# Patient Record
Sex: Male | Born: 1940 | Race: White | Hispanic: No | Marital: Married | Smoking: Former smoker
Health system: Southern US, Community
[De-identification: ages and names within clinical notes are randomized; demographics above are authoritative.]

## PROBLEM LIST (undated history)

## (undated) DIAGNOSIS — I10 Essential (primary) hypertension: Secondary | ICD-10-CM

## (undated) DIAGNOSIS — C159 Malignant neoplasm of esophagus, unspecified: Secondary | ICD-10-CM

## (undated) DIAGNOSIS — E785 Hyperlipidemia, unspecified: Secondary | ICD-10-CM

## (undated) DIAGNOSIS — K219 Gastro-esophageal reflux disease without esophagitis: Secondary | ICD-10-CM

## (undated) HISTORY — PX: INGUINAL HERNIA REPAIR: SUR1180

## (undated) HISTORY — DX: Essential (primary) hypertension: I10

## (undated) HISTORY — PX: ERCP: SHX60

## (undated) HISTORY — PX: LEG SURGERY: SHX1003

## (undated) HISTORY — PX: OTHER SURGICAL HISTORY: SHX169

---

## 1999-11-02 HISTORY — PX: OTHER SURGICAL HISTORY: SHX169

## 2009-01-11 ENCOUNTER — Ambulatory Visit (HOSPITAL_COMMUNITY): Admission: RE | Admit: 2009-01-11 | Discharge: 2009-01-11 | Payer: Self-pay | Admitting: Orthopaedic Surgery

## 2010-10-30 LAB — POCT HEMOGLOBIN-HEMACUE: Hemoglobin: 15.5 g/dL (ref 13.0–17.0)

## 2010-12-05 NOTE — Op Note (Signed)
NAME:  KAY, RICCIUTI NO.:  [id number]   MEDICAL RECORD NO.:  [id number]          PATIENT TYPE:  AMB   LOCATION:  DSC                          FACILITY:  [facility]   PHYSICIAN:  Lubertha Basque. Dalldorf, M.D.DATE OF BIRTH:  05/07/1941   DATE OF PROCEDURE:  01/11/2009  DATE OF DISCHARGE:                               OPERATIVE REPORT   PREOPERATIVE DIAGNOSES:  1. Right shoulder impingement.  2. Right shoulder massive rotator cuff tear.   POSTOPERATIVE DIAGNOSES:  1. Right shoulder impingement.  2. Right shoulder massive rotator cuff tear.   PROCEDURE:  1. Right shoulder arthroscopic acromioplasty.  2. Right shoulder arthroscopic partial claviculectomy.  3. Right shoulder arthroscopic debridement.   ANESTHESIA:  General and block.   ATTENDING SURGEON:  Lubertha Basque. Dalldorf, MD   INDICATIONS FOR PROCEDURE:  The patient is a 70 year old male with about  4 months of significant shoulder pain.  This started with a fall.  He  has had difficulty using his arm and resting.  His x-ray and MRI studies  are consistent with a massive rotator cuff tear.  He is offered  arthroscopy.  Informed operative consent was obtained after discussion  of possible complications including reaction to anesthesia and  infection.   SUMMARY, FINDINGS, AND PROCEDURE:  Under general anesthesia and a block,  a right shoulder arthroscopy was performed.  The glenohumeral joint  showed no degenerative changes and the biceps tendon was intact.  A  massive cuff tear which was retracted back to the glenoid was discovered  and could not be advanced anywhere near the greater tuberosity.  A  thorough debridement was done taking this out of harm's way leaving  anterior and posterior sleeves of tissue.  He had an extremely prominent  subacromial morphology addressed with an acromioplasty back to a flat  surface.  He also had a lesser deformity at the [facility] joint addressed with  a partial claviculectomy.   DESCRIPTION OF PROCEDURE:  The patient was taken to the operating suite  where general anesthetic was applied without difficulty.  He was also  given a block in the preanesthesia area.  He was positioned in a beach-  chair position, and prepped and draped in normal sterile fashion.  After  administration of IV Kefzol, an arthroscopy of the right shoulder was  performed through a total of 3 portals.  Findings were as noted above  and procedure consisted of most significantly the acromioplasty.  This  was done with a bur in the lateral position involving the transfer of  the bur to the posterior position.  I also performed resection of  prominence of the distal clavicle.  He had a massive rotator cuff tear  which I could not advance significantly and I elected to debride the  remaining portion of this structure.  The biceps tendon was intact and  there was minimal, if any, degenerative change in the shoulder.  The  shoulder was thoroughly irrigated followed by reapproximation of portals  loosely with nylon.  Adaptic was applied followed by dry gauze and tape.  Estimated blood loss and intraoperative  fluids can be obtained from  Anesthesia records.   DISPOSITION:  The patient was extubated in the operating room and taken  to recovery room in stable condition.  He was to go home same-day and  follow up with me in the office in less than a week.  I will contact him  by phone tonight.      Lubertha Basque Jerl Santos, M.D.  Electronically Signed     PGD/MEDQ  D:  01/11/2009  T:  01/12/2009  Job:  [id number]

## 2015-10-05 HISTORY — PX: PROSTATE BIOPSY: SHX241

## 2015-11-23 DIAGNOSIS — C159 Malignant neoplasm of esophagus, unspecified: Secondary | ICD-10-CM

## 2015-11-23 HISTORY — DX: Malignant neoplasm of esophagus, unspecified: C15.9

## 2015-11-24 DIAGNOSIS — C155 Malignant neoplasm of lower third of esophagus: Secondary | ICD-10-CM | POA: Insufficient documentation

## 2015-12-13 ENCOUNTER — Encounter: Payer: Self-pay | Admitting: Radiation Oncology

## 2015-12-13 ENCOUNTER — Telehealth: Payer: Self-pay | Admitting: *Deleted

## 2015-12-13 ENCOUNTER — Other Ambulatory Visit: Payer: Self-pay | Admitting: Radiation Oncology

## 2015-12-13 ENCOUNTER — Inpatient Hospital Stay
Admission: RE | Admit: 2015-12-13 | Discharge: 2015-12-13 | Disposition: A | Discharge status: Home / Self Care | Payer: Self-pay | Source: Ambulatory Visit | Attending: Radiation Oncology | Admitting: Radiation Oncology

## 2015-12-13 DIAGNOSIS — C155 Malignant neoplasm of lower third of esophagus: Secondary | ICD-10-CM

## 2015-12-13 NOTE — Telephone Encounter (Signed)
Oncology Nurse Navigator Documentation  Oncology Nurse Navigator Flowsheets 12/13/2015  Navigator Location [facility]  Navigator Encounter Type Introductory phone call  Abnormal Finding Date 11/23/2015  Confirmed Diagnosis Date 11/24/2015  Spoke with patient and his wife and  provided new patient appointment for 12/14/15 at 1:00 pm with Dr. Truitt Merle. Informed of location of [place], valet service, and registration process. Reminded to bring insurance cards and a current medication list, including supplements. Patient verbalizes understanding. Informed him that navigator will assist him in getting downstairs to see Dr. Lisbeth Renshaw after his visit with Dr. Burr Medico. Left VM for his daughter, Janace Hoard as well with the appointment. HIM notified to enter appt. In EPIC.

## 2015-12-13 NOTE — Telephone Encounter (Signed)
Patient's daughter Fred [name] came and brought medical records and two cd's which I took upstairs to [place] in radiology to upload in the system.

## 2015-12-13 NOTE — Progress Notes (Addendum)
GI Location of Tumor / Histology: Esophagus (6cm) mass  Fred [name] presented  months ago with symptoms of: trouble swallowing   Biopsies of  (if applicable) revealed: [id number] third of Esophageus,mucosal biopsy: Mucinous adenocarcinoma,moderately differentiated  Past/Anticipated interventions by surgeon, if any: Dr. Benay Pike. Toledo,MD.11/23/15 : EGD   Past/Anticipated interventions by medical oncology, if any: saw Dr. Burr Medico today 12/14/15  Weight changes, if any: weight loss 15 lb   Bowel/Bladder complaints, if any: regular bowel and bladder   Nausea / Vomiting, if any: No  Pain issues, if any: NO  Any blood per rectum:  No  SAFETY ISSUES: difficulty swallowing some foods mostly breads, has to cut up food s and chews a long time  Prior radiation? NO  Pacemaker/ICD?  NO Is the patient on methotrexate? NO Current Complaints/Details: Married,Enlarged prostate,former cigarette smoker 1ppd for 8-10 years   Quit 1969, no smokeless tobacco, no alcohol,no drug use Maternal grandfather stomach cancer  Allergies:NKA  Prostate surgery 5 weeks ago and bx'd no cancer  Dr, Alejandro Mulling, [facility]     BP 140/55 mmHg  Pulse 54  Temp(Src) 98.3 F (36.8 C) (Oral)  Resp 20  Ht 5' 5.5" (1.664 m)  Wt 141 lb (63.957 kg)  BMI 23.10 kg/m2  SpO2 100%  Wt Readings from Last 3 Encounters:  12/14/15 141 lb (63.957 kg)  12/14/15 141 lb 1.6 oz (64.003 kg)

## 2015-12-14 ENCOUNTER — Ambulatory Visit
Admission: RE | Admit: 2015-12-14 | Discharge: 2015-12-14 | Disposition: A | Discharge status: Home / Self Care | Payer: No Typology Code available for payment source | Source: Ambulatory Visit | Attending: Radiation Oncology | Admitting: Radiation Oncology

## 2015-12-14 ENCOUNTER — Other Ambulatory Visit: Payer: Self-pay | Admitting: Hematology

## 2015-12-14 ENCOUNTER — Ambulatory Visit (HOSPITAL_BASED_OUTPATIENT_CLINIC_OR_DEPARTMENT_OTHER): Payer: No Typology Code available for payment source

## 2015-12-14 ENCOUNTER — Encounter: Payer: Self-pay | Admitting: Radiation Oncology

## 2015-12-14 ENCOUNTER — Encounter: Payer: Self-pay | Admitting: Hematology

## 2015-12-14 ENCOUNTER — Ambulatory Visit
Admission: RE | Admit: 2015-12-14 | Discharge: 2015-12-14 | Disposition: A | Discharge status: Home / Self Care | Payer: Self-pay | Source: Ambulatory Visit | Attending: Hematology | Admitting: Hematology

## 2015-12-14 ENCOUNTER — Telehealth: Payer: Self-pay | Admitting: Hematology

## 2015-12-14 ENCOUNTER — Ambulatory Visit (HOSPITAL_BASED_OUTPATIENT_CLINIC_OR_DEPARTMENT_OTHER): Payer: No Typology Code available for payment source | Admitting: Hematology

## 2015-12-14 ENCOUNTER — Encounter: Payer: Self-pay | Admitting: *Deleted

## 2015-12-14 VITALS — BP 148/56 | HR 63 | Temp 98.2°F | Resp 18 | Ht 65.5 in | Wt 141.1 lb

## 2015-12-14 VITALS — BP 140/55 | HR 54 | Temp 98.3°F | Resp 20 | Ht 65.5 in | Wt 141.0 lb

## 2015-12-14 DIAGNOSIS — I10 Essential (primary) hypertension: Secondary | ICD-10-CM

## 2015-12-14 DIAGNOSIS — C155 Malignant neoplasm of lower third of esophagus: Secondary | ICD-10-CM

## 2015-12-14 DIAGNOSIS — C801 Malignant (primary) neoplasm, unspecified: Secondary | ICD-10-CM

## 2015-12-14 DIAGNOSIS — K219 Gastro-esophageal reflux disease without esophagitis: Secondary | ICD-10-CM | POA: Insufficient documentation

## 2015-12-14 DIAGNOSIS — Z510 Encounter for antineoplastic radiation therapy: Secondary | ICD-10-CM | POA: Insufficient documentation

## 2015-12-14 HISTORY — DX: Gastro-esophageal reflux disease without esophagitis: K21.9

## 2015-12-14 HISTORY — DX: Malignant neoplasm of esophagus, unspecified: C15.9

## 2015-12-14 LAB — COMPREHENSIVE METABOLIC PANEL
ALT: 13 U/L (ref 0–55)
ANION GAP: 9 meq/L (ref 3–11)
AST: 18 U/L (ref 5–34)
Albumin: 4 g/dL (ref 3.5–5.0)
Alkaline Phosphatase: 85 U/L (ref 40–150)
BILIRUBIN TOTAL: 0.44 mg/dL (ref 0.20–1.20)
BUN: 6.6 mg/dL — ABNORMAL LOW (ref 7.0–26.0)
CALCIUM: 9.3 mg/dL (ref 8.4–10.4)
CHLORIDE: 109 meq/L (ref 98–109)
CO2: 26 meq/L (ref 22–29)
CREATININE: 0.8 mg/dL (ref 0.7–1.3)
EGFR: 87 mL/min/{1.73_m2} — AB (ref >90)
Glucose: 100 mg/dl (ref 70–140)
Potassium: 4.1 mEq/L (ref 3.5–5.1)
Sodium: 143 mEq/L (ref 136–145)
Total Protein: 7 g/dL (ref 6.4–8.3)

## 2015-12-14 LAB — CBC WITH DIFFERENTIAL/PLATELET
BASO%: 0.8 % (ref 0.0–2.0)
BASOS ABS: 0.1 10*3/uL (ref 0.0–0.1)
EOS ABS: 0.1 10*3/uL (ref 0.0–0.5)
EOS%: 2 % (ref 0.0–7.0)
HEMATOCRIT: 41.4 % (ref 38.4–49.9)
HGB: 14.3 g/dL (ref 13.0–17.1)
LYMPH#: 1.5 10*3/uL (ref 0.9–3.3)
LYMPH%: 20.7 % (ref 14.0–49.0)
MCH: 30.9 pg (ref 27.2–33.4)
MCHC: 34.4 g/dL (ref 32.0–36.0)
MCV: 89.8 fL (ref 79.3–98.0)
MONO#: 0.6 10*3/uL (ref 0.1–0.9)
MONO%: 7.9 % (ref 0.0–14.0)
NEUT#: 4.8 10*3/uL (ref 1.5–6.5)
NEUT%: 68.6 % (ref 39.0–75.0)
PLATELETS: 250 10*3/uL (ref 140–400)
RBC: 4.61 10*6/uL (ref 4.20–5.82)
RDW: 13.3 % (ref 11.0–14.6)
WBC: 7 10*3/uL (ref 4.0–10.3)

## 2015-12-14 NOTE — Progress Notes (Signed)
Radiation Oncology         [phone] ________________________________  Name: Fred [name] MRN: [id number]  Date: 12/14/2015  DOB: 1940-10-10  CC:No primary care provider on file.  No ref. provider found     REFERRING PHYSICIAN: No ref. 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  HISTORY OF PRESENT ILLNESS:Fred [name] is a 75 y.o. male who is seen for an initial consultation visit. He initially presented with about 15 pounds of weight loss over about 3 months. He began experiencing trouble swallowing a couple weeks ago. He then presented to Dr. Alice Reichert who recommended that patient undergo endoscopy, and on 11/24/2015 this was performed revealing a mass in this lower third of his esophagus approximately 6 cm in dimension and on biopsy found to be mucinous adenocarcinoma moderately differentiated. A PET scan on 12/05/2015 revealed an SUV of 8.5 in the esphogeal mass and hypermetabolic activity with a SUV of 19.8 in the prostate. He did not have any other sites for metastatic disease, and apparently had procedure recently performed on his prostate. He has met with Dr. Burr Medico. He anticipates undergoing chemosensitization in radiotherapy.  He is scheduled for an EUS on 12/15/2015 and will meet with a surgeon at [facility] on 12/16/2015 at 10:00 AM.     PREVIOUS RADIATION THERAPY: No  PAST MEDICAL HISTORY:  has a past medical history of Esophageal cancer ([place]) (11/23/15); Hypertension; and GERD (gastroesophageal reflux disease).    PAST SURGICAL HISTORY: Past Surgical History  Procedure Laterality Date  . Right shoulder surgery    . Prostate biopsy  10/05/15  . Cystoscope  4/12/1    prostate   FAMILY HISTORY: family history includes Cancer (age of onset: 44) in his maternal grandfather.  SOCIAL HISTORY:  reports that he quit smoking about 48 years ago. He does not have any smokeless tobacco history on file. He reports that he does not  drink alcohol or use illicit drugs. The patient is married and resides in [place]. He is accompanied by his wife and daughter.  ALLERGIES: Review of patient's allergies indicates no known allergies.  MEDICATIONS:  Current Outpatient Prescriptions  Medication Sig Dispense Refill  . atenolol (TENORMIN) 25 MG tablet Take 25 mg by mouth daily.    Marland Kitchen omeprazole (PRILOSEC) 20 MG capsule Take 20 mg by mouth daily.    . simvastatin (ZOCOR) 20 MG tablet Take 20 mg by mouth daily at 6 PM. 1/2 tablet    . Vitamin D, Cholecalciferol, 1000 units TABS Take 1,000 Units by mouth 2 (two) times daily. D3     No current facility-administered medications for this encounter.   REVIEW OF SYSTEMS: On review of systems, the patient reports some difficulty swallowing solid foods and bread. He reports having to cut his foods into smaller pieces. He states that he sometimes feels as though his food gets caught in his chest. He is mainly consuming soft foods at this time. He reports some pain in his jaw area bilaterally. He denies any pain elsewhere. He denies any bladder/bowel issues, nausea, vomiting, pain, or any blood per rectum. He has had a 15 pound weight loss. A complete review of systems was obtained and is otherwise negative.    PHYSICAL EXAM:  height is 5' 5.5" (1.664 m) and weight is 141 lb (63.957 kg). His oral temperature is 98.3 F (36.8 C). His blood pressure is 140/55 and his pulse is 54. His respiration is 20 and oxygen saturation is  100%.   In general this is a thin and chronically ill-appearing in no acute distress. He is alert and oriented x4 and appropriate throughout the examination. HEENT reveals that the patient is normocephalic, atraumatic. EOMs are intact. PERRLA. Skin is intact without any evidence of gross lesions. Cardiovascular exam reveals a regular rate and rhythm, no clicks rubs or murmurs are auscultated. Chest is clear to auscultation bilaterally. Lymphatic assessment is performed and  does not reveal any adenopathy in the cervical, supraclavicular, axillary, or inguinal chains. Abdomen has active bowel sounds in all quadrants and is intact. The abdomen is soft, non tender, non distended. Lower extremities are negative for pretibial pitting edema, deep calf tenderness, cyanosis or clubbing.   ECOG = 0  0 - Asymptomatic (Fully active, able to carry on all predisease activities without restriction)  1 - Symptomatic but completely ambulatory (Restricted in physically strenuous activity but ambulatory and able to carry out work of a light or sedentary nature. For example, light housework, office work)  2 - Symptomatic, <50% in bed during the day (Ambulatory and capable of all self care but unable to carry out any work activities. Up and about more than 50% of waking hours)  3 - Symptomatic, >50% in bed, but not bedbound (Capable of only limited self-care, confined to bed or chair 50% or more of waking hours)  4 - Bedbound (Completely disabled. Cannot carry on any self-care. Totally confined to bed or chair)  5 - Death   Fred [name] MM, Creech RH, Tormey DC, et al. [phone]). "Toxicity and response criteria of the [facility]". [place] Oncol. 5 (6): 649-55  LABORATORY DATA:  Lab Results  Component Value Date   WBC 7.0 12/14/2015   HGB 14.3 12/14/2015   HCT 41.4 12/14/2015   MCV 89.8 12/14/2015   PLT 250 12/14/2015   Lab Results  Component Value Date   NA 143 12/14/2015   K 4.1 12/14/2015   CO2 26 12/14/2015   Lab Results  Component Value Date   ALT 13 12/14/2015   AST 18 12/14/2015   ALKPHOS 85 12/14/2015   BILITOT 0.44 12/14/2015    RADIOGRAPHY: Ct Outside Films Chest  12/13/2015  CLINICAL DATA:  This exam is stored here for comparison purposes only and was performed at an outside facility.   Please contact the originating institution for any associated interpretation or report.   Nm Outside Films Spine  12/14/2015  This examination  belongs to an outside facility and is stored here for comparison purposes only.  Contact the originating outside institution for any associated report or interpretation.  IMPRESSION: Mr. Clugston is a pleasant 75 y.o male with an encounter diagnosis of malignant neoplasm of lower third of esophagus.   PLAN: Dr. Lisbeth Renshaw discusses the findings from the patient's pathology, and discusses the findings as well on his PET scan pertaining to the esophageal cancer and the anomaly seen in his prostate. The patient did have blood   taken today in the lab, and we have added a PSA to be run as well. Dr. Lisbeth Renshaw discusses the recommendations for neoadjuvant chemotherapy and radiotherapy concurrently to treat the esophageal cancer, and would recommend 28 fractions of radiotherapy over approximately 5 1/2 weeks the patient will be undergoing endoscopic ultrasound tomorrow, and meeting with surgery on Friday. The anticipation would be that he undergo surgery following his chemotherapy and radiation.We discussed the potential risks, benefits, and side effects of this treatment. The patient has signed informed consent and  is prepared to proceed with radiation treatment. CT simulation is scheduled for Friday, 12/16/2015, at 2:00 pm.  In a visit lasting 60 minutes face to face with the patient, more than 50% of that time was spent in counseling and/or coordination of care.   The above documentation reflects my direct findings during this shared patient visit. Please see the separate note by Dr. Lisbeth Renshaw on this date for the remainder of the patient's plan of care.    Carola Rhine, PAC   This document serves as a record of services personally performed by Shona Simpson, PA and Kyung Rudd, MD. It was created on their behalf by Jenell Milliner, a trained medical scribe. The creation of this record is based on the scribe's personal observations and the provider's statements to them. This document has been checked and approved by  the attending provider.

## 2015-12-14 NOTE — Patient Instructions (Signed)
  Care Plan Summary- 12/14/2015 Name: Fred [name]     DOB:  1941-02-23 Your Medical Team: Medical Oncologist:  Dr. Truitt Merle Radiation Oncologist:  Dr. Kyung Rudd Surgeon:   Dr. Johney Maine Type of Cancer: Adenocarcinoma of Esophagus Stage/Grade:  Undetermined  *Exact staging of your cancer is based on size of the tumor, depth of invasion, involvement of lymph nodes or not, and whether or not the cancer has spread beyond the primary site   Recommendations: Based on information available as of today's consult. Recommendations may change depending on the results of further tests or exams. 1) Radiation therapy M-F with weekly Taxol/Carboplatin chemotherapy for 5-6 weeks begining  early June   2) Surgery after chemo and radiation therapy 3) Can discuss clinical trial after surgery if desired Next Steps: 1) EUS on 5/25 and surgeon on 5/26 as scheduled as well as Dr. Lisbeth Renshaw today 2) Baseline labs today; we will schedule you to see dietician before chemotherapy 3) Will schedule for chemo class on day of SIM next week (office will call you) Questions? [name], RN, BSN at [phone]. [name] is your Oncology Nurse Navigator and is available to assist you while you're receiving your medical care at [facility].

## 2015-12-14 NOTE — Progress Notes (Signed)
[place]  Telephone:(336) [phone] Fax:([phone]) [phone]  Clinic New Consult Note   No care team member to display 12/14/2015  REFERRAL PHYSICIAN: Dr. Alice Reichert   CHIEF COMPLAINTS/PURPOSE OF CONSULTATION:  Newly diagnosed esophageal adenocarcinoma  Oncology History   Presented with solid dysphagia at least daily with chest pain. Involuntary weight loss of 10-12 lb over past year     Esophageal cancer ([place])   11/23/2015 Procedure EGD per [facility](Dr. Toledo):6cm mass in lower third of esophagus   11/24/2015 Initial Diagnosis Esophageal cancer ([place])   11/24/2015 Pathology Results Mucinous adenocarcinoma, moderately differentiated-Her2 analysis pending    HISTORY OF PRESENTING ILLNESS:  Fred [name] 75 y.o. male is here because of His newly diagnosed esophageal cancer. He is accompanied by his wife and daughter to my clinic today.  He has had dysphagia for 2 months, mainly with solid food, no dysphagia with soft food or liquid. He also has mild pain in mid chest when he swallows. No nausea, abdominal pain, or other discomfort. He has lost about 15 lbs in the last year, no other symoptoms. He was seen by his primary care physician at [facility], and referred to gastroenterologist Dr. Alice Reichert. He underwent EGD on 11/23/2015, which showed a 6 cm mass in the lower third of esophagus. Biopsy showed adenocarcinoma, moderately differentiated. He was referred to [place] to consider neoadjuvant therapy. He is scheduled to see a Psychologist, sport and exercise at [facility] later this week.  He has had some urinary frequency, underwent TURP on 11/02/2015.    MEDICAL HISTORY:  Past Medical History  Diagnosis Date  . Esophageal cancer ([place]) 11/23/15    lower 3rd esohagus   . Hypertension     SURGICAL HISTORY: Past Surgical History  Procedure Laterality Date  . Right shoulder surgery      SOCIAL HISTORY: Social History   Social History  . Marital Status: Married    Spouse Name: N/A  .  Number of Children: N/A  . Years of Education: N/A   Occupational History  . Not on file.   Social History Main Topics  . Smoking status: Former Smoker -- 1.00 packs/day for 10 years    Quit date: 07/24/1967  . Smokeless tobacco: Not on file  . Alcohol Use: No  . Drug Use: No  . Sexual Activity: Not Currently   Other Topics Concern  . Not on file   Social History Narrative    FAMILY HISTORY: Family History  Problem Relation Age of Onset  . Cancer Maternal Grandfather 36    gastric cancer     ALLERGIES:  has No Known Allergies.  MEDICATIONS:  Current Outpatient Prescriptions  Medication Sig Dispense Refill  . atenolol (TENORMIN) 25 MG tablet Take 25 mg by mouth daily.    Marland Kitchen omeprazole (PRILOSEC) 20 MG capsule Take 20 mg by mouth daily.    . simvastatin (ZOCOR) 20 MG tablet Take 20 mg by mouth daily at 6 PM. 1/2 tablet    . Vitamin D, Cholecalciferol, 1000 units TABS Take 1,000 Units by mouth 2 (two) times daily. D3     No current facility-administered medications for this visit.    REVIEW OF SYSTEMS:   Constitutional: Denies fevers, chills or abnormal night sweats, (+) 15 lbs weight loss in the past year Eyes: Denies blurriness of vision, double vision or watery eyes Ears, nose, mouth, throat, and face: Denies mucositis or sore throat Respiratory: Denies cough, dyspnea or wheezes Cardiovascular: Denies palpitation, chest discomfort or lower extremity swelling  Gastrointestinal:  Denies nausea, heartburn or change in bowel habits Skin: Denies abnormal skin rashes Lymphatics: Denies new lymphadenopathy or easy bruising Neurological:Denies numbness, tingling or new weaknesses Behavioral/Psych: Mood is stable, no new changes  All other systems were reviewed with the patient and are negative.  PHYSICAL EXAMINATION: ECOG PERFORMANCE STATUS: 1 - Symptomatic but completely ambulatory  Filed Vitals:   12/14/15 1319  BP: 148/56  Pulse: 63  Temp: 98.2 F (36.8 C)    Resp: 18   Filed Weights   12/14/15 1319  Weight: 141 lb 1.6 oz (64.003 kg)    GENERAL:alert, no distress and comfortable SKIN: skin color, texture, turgor are normal, no rashes or significant lesions EYES: normal, conjunctiva are pink and non-injected, sclera clear OROPHARYNX:no exudate, no erythema and lips, buccal mucosa, and tongue normal  NECK: supple, thyroid normal size, non-tender, without nodularity LYMPH:  no palpable lymphadenopathy in the cervical, axillary or inguinal LUNGS: clear to auscultation and percussion with normal breathing effort HEART: regular rate & rhythm and no murmurs and no lower extremity edema ABDOMEN:abdomen soft, non-tender and normal bowel sounds Musculoskeletal:no cyanosis of digits and no clubbing  PSYCH: alert & oriented x 3 with fluent speech NEURO: no focal motor/sensory deficits  LABORATORY DATA:  I have reviewed the data as listed CBC Latest Ref Rng 01/11/2009  Hemoglobin 13.0 - 17.0 g/dL 15.5    Pathology report Diagnosis 11/23/2015 Mass, lower third of the esophagus, mucosal biopsy Mucinous adenocarcinoma, moderately differentiated.  HER 2 (-)   RADIOGRAPHIC STUDIES: I have personally reviewed the radiological images as listed and agreed with the findings in the report. Ct Outside Films Chest  12/13/2015  CLINICAL DATA:  This exam is stored here for comparison purposes only and was performed at an outside facility.   Please contact the originating institution for any associated interpretation or report.   EGD by Dr. Alice Reichert 12/20/2015 Impression: -A 6 cm mass was found in the lower third of the esophagus. Multiple biopsies were performed. -A sliding small sized hiatal hernia otherwise normal mucosa in stomach into duodenum.  PET scan 12/05/2015 ([facility]) Impression #1 distal esophagus which she'll hypermetabolic mass consistent with known malignancy #2 hypermetabolic activity in the left anterior prostate gland, correlate for  malignancy #3 metabolic density left adrenal nodule. CT attenuation suggest a benign adrenal adenoma which can be moderately hypermetabolic. However given history of malignancy, metastatic disease is not excluded. #4 left apical 2 mm pulmonary nodule, too small for evaluation by PET.   ASSESSMENT & PLAN:  75 year old male presented with dysphagia with solid food  1. Distal esophageal adenocarcinoma, [id number] -I reviewed his CT scan, PET scan, EGD, and the biopsy findings with patient and his family member in details. -He is scheduled to have a a EUS for tumor staging -The PET scan showed no definitive distant metastasis. 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 This will be followed in the future scan. -If his tumor is T2 or higher stage, we recommend neoadjuvant chemotherapy and radiation, followed by esophagectomy. He is going to see a Psychologist, sport and exercise at [facility] next week. -We discussed the options of neoadjuvant chemotherapy. I recommend weekly carbotaxol, presumably is a candidate for esophagectomy. --Chemotherapy consent: Side effects including but does not not limited to, fatigue, nausea, vomiting, diarrhea, hair loss, neuropathy, fluid retention, renal and kidney dysfunction, neutropenic fever, needed for blood transfusion, bleeding, were discussed with patient in great detail. She agrees to proceed. -He is  scheduled to see Dr. Lisbeth Renshaw for radiation -I'll present his case in our GI tumor board next week -I'll see him back next week, with chemotherapy education. -lab today    Recommendations: Based on information available as of today's consult. Recommendations may change depending on the results of further tests or exams. 1) Radiation therapy M-F with weekly Taxol/Carboplatin chemotherapy for 5-6 weeks begining  early June   2) Surgery after chemo and radiation therapy 3) Can discuss clinical trial after surgery if  desired Next Steps: 1) EUS on 5/25 and surgeon on 5/26 as scheduled as well as Dr. Lisbeth Renshaw today 2) Baseline labs today; we will schedule you to see dietician before chemotherapy 3) Will schedule for chemo class on day of SIM next week (office will call you)   All questions were answered. The patient knows to call the clinic with any problems, questions or concerns. I spent 55 minutes counseling the patient face to face. The total time spent in the appointment was 60 minutes and more than 50% was on counseling.     Truitt Merle, MD 12/14/2015 1:48 PM

## 2015-12-14 NOTE — Progress Notes (Signed)
Oncology Nurse Navigator Documentation  Oncology Nurse Navigator Flowsheets 12/14/2015  Navigator Location [facility]  Navigator Encounter Type Initial MedOnc  Abnormal Finding Date -  Confirmed Diagnosis Date -  Patient Visit Type MedOnc;Initial  Treatment Phase Pre-Tx/Tx Discussion  Barriers/Navigation Needs Education;Coordination of Care  Education Understanding Cancer/ Treatment Options;Coping with Diagnosis/ Prognosis;Newly Diagnosed Cancer Education;Preparing for Upcoming Surgery/ Treatment  Interventions Coordination of Care;Referrals;Education Method  Referrals Nutrition/dietician  Coordination of Care Other--will need to obtain CT report from [place] office and keep his PCP at [facility] up to date on treatment as well as [facility] providers  Education Method Verbal;Written;Teach-back  Support Groups/Services [employer]-Personal Health Manager;GI Support Group;Other-[place] Support Services  Acuity Level 2  Time Spent with Patient 32  Met with patient, wife Fraser Din  and daughter, Janace Hoard during new patient visit. Explained the role of the GI Nurse Navigator and provided New Patient Packet with information on: 1. Esophageal cancer--discussed CEA and CA 19-9 tumor markers; how simulation is done and tx planning in rad onc 2. Support groups 3. Advanced Directives 4. Fall Safety Plan Answered questions, reviewed current treatment plan using TEACH back and provided emotional support. Provided copy of current treatment plan. Had him sign ROI to obtain CT report from his [place] and if we need to get the EUS results from [place]. Instructed them to be sure to tell [place] to send notes and reports to Dr. Burr Medico. Encouraged him to continue very soft, liquid diet and try to get at least 3 Boost or Ensures in/day. His wife is very supportive as well as his only child, Angie. Sent message to managed care to ensure that all his care here is filed under his [place] stressed this several  times at his visit. Escorted patient to lab and then to radiation oncology department for his consult with Dr. Lisbeth Renshaw.  [name], RN, BSN GI Oncology [place]

## 2015-12-14 NOTE — Telephone Encounter (Signed)
per pof to sch pt appt-per [name] she will sch chemo class and nutrition-gave pt copy of avs

## 2015-12-15 ENCOUNTER — Other Ambulatory Visit: Payer: Self-pay | Admitting: *Deleted

## 2015-12-15 LAB — CEA: CEA: 28.5 ng/mL — ABNORMAL HIGH (ref 0.0–4.7)

## 2015-12-15 LAB — CANCER ANTIGEN 19-9: CAN 19-9: 38 U/mL — AB (ref 0–35)

## 2015-12-15 LAB — PSA: PROSTATE SPECIFIC AG, SERUM: 2.4 ng/mL (ref 0.0–4.0)

## 2015-12-15 LAB — FERRITIN: FERRITIN: 65 ng/mL (ref 22–316)

## 2015-12-16 ENCOUNTER — Ambulatory Visit
Admission: RE | Admit: 2015-12-16 | Discharge: 2015-12-16 | Disposition: A | Discharge status: Home / Self Care | Payer: No Typology Code available for payment source | Source: Ambulatory Visit | Attending: Radiation Oncology | Admitting: Radiation Oncology

## 2015-12-16 ENCOUNTER — Other Ambulatory Visit: Payer: Self-pay | Admitting: Radiation Oncology

## 2015-12-16 ENCOUNTER — Telehealth: Payer: Self-pay | Admitting: Hematology

## 2015-12-16 ENCOUNTER — Other Ambulatory Visit: Payer: Self-pay | Admitting: *Deleted

## 2015-12-16 ENCOUNTER — Telehealth: Payer: Self-pay | Admitting: *Deleted

## 2015-12-16 DIAGNOSIS — Z510 Encounter for antineoplastic radiation therapy: Secondary | ICD-10-CM | POA: Diagnosis not present

## 2015-12-16 DIAGNOSIS — C155 Malignant neoplasm of lower third of esophagus: Secondary | ICD-10-CM

## 2015-12-16 DIAGNOSIS — R3989 Other symptoms and signs involving the genitourinary system: Secondary | ICD-10-CM

## 2015-12-16 NOTE — Telephone Encounter (Signed)
Per staff message and POF I have scheduled appts. Advised scheduler of appts. JMW  

## 2015-12-16 NOTE — Telephone Encounter (Signed)
Spoke with patient to confirm chemo education class 5/31 at 10 am

## 2015-12-16 NOTE — Telephone Encounter (Signed)
Oncology Nurse Navigator Documentation  Oncology Nurse Navigator Flowsheets 12/16/2015  Navigator Location [facility]  Navigator Encounter Type Telephone  Telephone Outgoing Call  Abnormal Finding Date -  Confirmed Diagnosis Date -  Patient Visit Type Walk-in  Treatment Phase Pre-Tx/Tx Discussion  Barriers/Navigation Needs Coordination of Care;Education  Education Preparing for Upcoming  Treatment  Interventions Coordination of Care;Education Method   Referrals -  Coordination of Care Appts--scheduled his chemo class and OV for 6/1 and in basket to infusion scheduler for chemo on 6/5  Education Method Verbal  Support Groups/Services -  Acuity -  Time Spent with Patient [place] came to speak w/navigator after his RT appointment today. According to his EUS yesterday he is staged at [facility]. Dr. Burr Medico notified and agrees that chemo/RT is still his plan pre op. Patient notified and he agrees to plan and appointments.

## 2015-12-20 NOTE — Addendum Note (Signed)
Encounter addended by: Kyung Rudd, MD on: 12/20/2015  4:27 PM<BR>     Documentation filed: Notes Section

## 2015-12-21 ENCOUNTER — Institutional Professional Consult (permissible substitution): Payer: Self-pay | Admitting: Radiation Oncology

## 2015-12-21 ENCOUNTER — Telehealth: Payer: Self-pay | Admitting: Hematology

## 2015-12-21 ENCOUNTER — Other Ambulatory Visit: Payer: Self-pay

## 2015-12-21 DIAGNOSIS — Z510 Encounter for antineoplastic radiation therapy: Secondary | ICD-10-CM | POA: Diagnosis not present

## 2015-12-21 NOTE — Telephone Encounter (Signed)
Spoke with [facility] and the system is down was not able to give the auth number. Will call back when system is up

## 2015-12-21 NOTE — Telephone Encounter (Signed)
Lt mess for [place] at [facility] choice regarding autho

## 2015-12-22 ENCOUNTER — Encounter: Payer: Self-pay | Admitting: *Deleted

## 2015-12-22 ENCOUNTER — Ambulatory Visit (HOSPITAL_BASED_OUTPATIENT_CLINIC_OR_DEPARTMENT_OTHER): Payer: No Typology Code available for payment source | Admitting: Hematology

## 2015-12-22 ENCOUNTER — Encounter: Payer: Self-pay | Admitting: Hematology

## 2015-12-22 ENCOUNTER — Telehealth: Payer: Self-pay | Admitting: Hematology

## 2015-12-22 ENCOUNTER — Other Ambulatory Visit: Payer: No Typology Code available for payment source

## 2015-12-22 ENCOUNTER — Telehealth: Payer: Self-pay | Admitting: *Deleted

## 2015-12-22 VITALS — BP 135/59 | HR 55 | Temp 98.6°F | Resp 18 | Ht 65.5 in | Wt 139.0 lb

## 2015-12-22 DIAGNOSIS — I10 Essential (primary) hypertension: Secondary | ICD-10-CM

## 2015-12-22 DIAGNOSIS — C155 Malignant neoplasm of lower third of esophagus: Secondary | ICD-10-CM | POA: Diagnosis not present

## 2015-12-22 MED ORDER — PROCHLORPERAZINE MALEATE 10 MG PO TABS: 10.0000 mg | ORAL_TABLET | Freq: Four times a day (QID) | ORAL | Status: DC | PRN

## 2015-12-22 MED ORDER — ONDANSETRON HCL 8 MG PO TABS: 8.0000 mg | ORAL_TABLET | Freq: Three times a day (TID) | ORAL | Status: DC | PRN

## 2015-12-22 NOTE — Telephone Encounter (Signed)
per pof to sch pt appt-sent MW email to sch trmt per pof-pt to get updated copy b4 leaving

## 2015-12-22 NOTE — Telephone Encounter (Signed)
Per staff message and POF I have scheduled appts. Advised scheduler of appts. JMW  

## 2015-12-23 ENCOUNTER — Telehealth: Payer: Self-pay | Admitting: *Deleted

## 2015-12-23 ENCOUNTER — Institutional Professional Consult (permissible substitution): Payer: Self-pay | Admitting: Radiation Oncology

## 2015-12-23 NOTE — Progress Notes (Signed)
[place]  Telephone:([phone] Fax:([phone]) [phone]  Clinic Follow up Note   Patient Care Team: Fred Merle, MD as Consulting Physician (Hematology) Fred Rudd, MD as Consulting Physician (Radiation Oncology)   CHIEF COMPLAINTS:  Follow up esophageal adenocarcinoma  Oncology History   Presented with solid dysphagia at least daily with chest pain. Involuntary weight loss of 10-12 lb over past year  Malignant neoplasm of lower third of esophagus (HCC)   Staging form: Esophagus - Adenocarcinoma, AJCC 7th Edition     Clinical stage from 12/15/2015: Stage IIIA (T3, N1, M0) - Signed by Fred Merle, MD on 12/24/2015       Malignant neoplasm of lower third of esophagus ([place])   11/23/2015 Procedure EGD per [facility](Dr. [place]):6cm mass in lower third of esophagus   11/24/2015 Initial Diagnosis Esophageal cancer ([place])   11/24/2015 Pathology Results Mucinous adenocarcinoma, moderately differentiated-Her2 analysis pending   12/05/2015 Imaging PET scan showed hypermetabolic a mass in distal esophagus, hypermetabolic activity in the left anterior prostate gland, metabolic density in left adrenal nodule, CT attenuation suggest a benign adrenal adenoma. Left apical 2 mm lung nodule.   12/15/2015 Procedure EUS showed a uT3N1 lesion from 27cm to 35cm from the incisors     HISTORY OF PRESENTING ILLNESS (12/14/2015):  [name] 75 y.o. male is here because of His newly diagnosed esophageal cancer. He is accompanied by his wife and daughter to my clinic today.  He has had dysphagia for 2 months, mainly with solid food, no dysphagia with soft food or liquid. He also has mild pain in mid chest when he swallows. No nausea, abdominal pain, or other discomfort. He has lost about 15 lbs in the last year, no other symoptoms. He was seen by his primary care physician at [name], and referred to gastroenterologist Dr. Alice [name]. He underwent EGD on 11/23/2015, which showed a 6 cm mass in the  lower third of esophagus. Biopsy showed adenocarcinoma, moderately differentiated. He was referred to [place] to consider neoadjuvant therapy. He is scheduled to see a Psychologist, sport and exercise at [name] later this week.  He has had some urinary frequency, underwent TURP on 11/02/2015.   CURRENT THERAPY: pending concurrent chemoRT with Weekly carboplatin AUC 2 and Taxol 34m/m2, starting on 12/26/2015   INTERIM HISTORY: Mr. MReasonerreturns for follow up. [facility] N1 disease. He was seen by surgeon Dr. VJohney [name] who agrees with neoadjuvant chemoradiation, followed up by an Fred Cornfieldwith feeding J and omental flap. Pt and his family would like to see [facility] surgeon Dr. GServando Snarein GBeckettfor a second opinion. His dysphagia is stable, he is tolerating soft diet and adequately well. He lost 2 pounds since last visit. No other new complaints.  MEDICAL HISTORY:  Past Medical History  Diagnosis Date  . Esophageal cancer ([place] 11/23/15    lower 3rd esohagus   . Hypertension   . GERD (gastroesophageal reflux disease)     SURGICAL HISTORY: Past Surgical History  Procedure Laterality Date  . Right shoulder surgery    . Prostate biopsy  10/05/15  . Cystoscope  4/12/1    prostate    SOCIAL HISTORY: Social History   Social History  . Marital Status: Married    Spouse Name: N/A  . Number of Children: N/A  . Years of Education: N/A   Occupational History  . Not on file.   Social History Main Topics  . Smoking status: Former  Smoker -- 1.00 packs/day for 10 years    Quit date: 07/24/1967  . Smokeless tobacco: Not on file  . Alcohol Use: No  . Drug Use: No  . Sexual Activity: Not Currently   Other Topics Concern  . Not on file   Social History Narrative    FAMILY HISTORY: Family History  Problem Relation Age of Onset  . Cancer Maternal Grandfather 9    gastric cancer     ALLERGIES:  has No Known Allergies.  MEDICATIONS:    Current Outpatient Prescriptions  Medication Sig Dispense Refill  . atenolol (TENORMIN) 25 MG tablet Take 25 mg by mouth daily.    Marland Kitchen omeprazole (PRILOSEC) 20 MG capsule Take 20 mg by mouth daily.    . simvastatin (ZOCOR) 20 MG tablet Take 20 mg by mouth daily at 6 PM. 1/2 tablet    . Vitamin D, Cholecalciferol, 1000 units TABS Take 1,000 Units by mouth 2 (two) times daily. D3    . ondansetron (ZOFRAN) 8 MG tablet Take 1 tablet (8 mg total) by mouth every 8 (eight) hours as needed for nausea. 30 tablet 3  . prochlorperazine (COMPAZINE) 10 MG tablet Take 1 tablet (10 mg total) by mouth every 6 (six) hours as needed. 30 tablet 3   No current facility-administered medications for this visit.    REVIEW OF SYSTEMS:   Constitutional: Denies fevers, chills or abnormal night sweats, (+) 15 lbs weight loss in the past year Eyes: Denies blurriness of vision, double vision or watery eyes Ears, nose, mouth, throat, and face: Denies mucositis or sore throat Respiratory: Denies cough, dyspnea or wheezes Cardiovascular: Denies palpitation, chest discomfort or lower extremity swelling Gastrointestinal:  Denies nausea, heartburn or change in bowel habits Skin: Denies abnormal skin rashes Lymphatics: Denies new lymphadenopathy or easy bruising Neurological:Denies numbness, tingling or new weaknesses Behavioral/Psych: Mood is stable, no new changes  All other systems were reviewed with the patient and are negative.  PHYSICAL EXAMINATION: ECOG PERFORMANCE STATUS: 1 - Symptomatic but completely ambulatory  Filed Vitals:   12/22/15 1144  BP: 135/59  Pulse: 55  Temp: 98.6 F (37 C)  Resp: 18   Filed Weights   12/22/15 1144  Weight: 139 lb (63.05 kg)    GENERAL:alert, no distress and comfortable SKIN: skin color, texture, turgor are normal, no rashes or significant lesions EYES: normal, conjunctiva are pink and non-injected, sclera clear OROPHARYNX:no exudate, no erythema and lips, buccal  mucosa, and tongue normal  NECK: supple, thyroid normal size, non-tender, without nodularity LYMPH:  no palpable lymphadenopathy in the cervical, axillary or inguinal LUNGS: clear to auscultation and percussion with normal breathing effort HEART: regular rate & rhythm and no murmurs and no lower extremity edema ABDOMEN:abdomen soft, non-tender and normal bowel sounds Musculoskeletal:no cyanosis of digits and no clubbing  PSYCH: alert & oriented x 3 with fluent speech NEURO: no focal motor/sensory deficits  LABORATORY DATA:  I have reviewed the data as listed CBC Latest Ref Rng 12/14/2015 01/11/2009  WBC 4.0 - 10.3 10e3/uL 7.0 -  Hemoglobin 13.0 - 17.1 g/dL 14.3 15.5  Hematocrit 38.4 - 49.9 % 41.4 -  Platelets 140 - 400 10e3/uL 250 -   CMP Latest Ref Rng 12/14/2015  Glucose 70 - 140 mg/dl 100  BUN 7.0 - 26.0 mg/dL 6.6(L)  Creatinine 0.7 - 1.3 mg/dL 0.8  Sodium 136 - 145 mEq/L 143  Potassium 3.5 - 5.1 mEq/L 4.1  CO2 22 - 29 mEq/L 26  Calcium 8.4 - 10.4 mg/dL  9.3  Total Protein 6.4 - 8.3 g/dL 7.0  Total Bilirubin 0.20 - 1.20 mg/dL 0.44  Alkaline Phos 40 - 150 U/L 85  AST 5 - 34 U/L 18  ALT 0 - 55 U/L 13     Pathology report Diagnosis 11/23/2015 Mass, lower third of the esophagus, mucosal biopsy Mucinous adenocarcinoma, moderately differentiated.  HER 2 (-)   RADIOGRAPHIC STUDIES: I have personally reviewed the radiological images as listed and agreed with the findings in the report. Ct Outside Films Chest  12/13/2015  CLINICAL DATA:  This exam is stored here for comparison purposes only and was performed at an outside facility.   Please contact the originating institution for any associated interpretation or report.   Nm Outside Films Spine  12/14/2015  This examination belongs to an outside facility and is stored here for comparison purposes only.  Contact the originating outside institution for any associated report or interpretation.  EGD by Dr. Alice [name]  12/20/2015 Impression: -A 6 cm mass was found in the lower third of the esophagus. Multiple biopsies were performed. -A sliding small sized hiatal hernia otherwise normal mucosa in stomach into duodenum.  PET scan 12/05/2015 ([facility]) Impression #1 distal esophagus which she'll hypermetabolic mass consistent with known malignancy #2 hypermetabolic activity in the left anterior prostate gland, correlate for malignancy #3 metabolic density left adrenal nodule. CT attenuation suggest a benign adrenal adenoma which can be moderately hypermetabolic. However given history of malignancy, metastatic disease is not excluded. #4 left apical 2 mm pulmonary nodule, too small for evaluation by PET.  EUS at [facility], [name] 12/14/2015: Impressions: Mass was noted from 27 cm to 35 cm from the in sisters. By EUS criteria and the lesion was T3, there was 1-2 metastatic regional lymph nodes.  ASSESSMENT & PLAN:  75 year old male presented with dysphagia with solid food  1. Distal esophageal adenocarcinoma, [id number] -I reviewed his CT scan, PET scan, EGD, and the biopsy findings with patient and his family member in details. -The PET scan showed no definitive distant metastasis. 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 This will be followed in the future scan. -By EUS, he has T3 N1 stage III disease. We discussed the options of neoadjuvant chemotherapy. I recommend weekly carbo taxol, he is a candidate for esophagectomy, evaluated by Dr. Johney [name]  --Chemotherapy consent: Side effects including but does not not limited to, fatigue, nausea, vomiting, diarrhea, hair loss, neuropathy, fluid retention, renal and kidney dysfunction, neutropenic fever, needed for blood transfusion, bleeding, were discussed with patient in great detail. He agrees to proceed. -He is scheduled to start chemotherapy and radiation on Monday, 12/26/2015. -I codeine Zofran and  Compazine for him today. -His current have chemotherapy class today.  2. HTN -We will monitor his blood pressure closely during his chemotherapy and radiation. -We discussed chemotherapy and radiation may affect his blood pressure, especially if he is dehydrated. If his blood pressure drops, we may hold off his blood pressure pill. -I encouraged him to monitor his blood pressure to home.  Plan -chemo class today -start chemotherapy and radiation with weekly Carbo AUC 2 and paclitaxel 45 mg/m, a total of 6 weeks  -Lab weekly -He will see APP Lattie Haw or me from week 2   All questions were answered. The patient knows to call the clinic with any problems, questions or concerns. I spent 25 minutes counseling the patient face to face. The total time spent in the appointment was 30 minutes  and more than 50% was on counseling.     Fred Merle, MD 12/23/2015 9:27 AM

## 2015-12-23 NOTE — Telephone Encounter (Signed)
Oncology Nurse Navigator Documentation  Oncology Nurse Navigator Flowsheets 12/23/2015  Navigator Location [facility]  Navigator Encounter Type Telephone  Telephone Medication Assistance  Abnormal Finding Date -  Confirmed Diagnosis Date -  Patient Visit Type -  Treatment Phase -  Barriers/Navigation Needs -Asking if he can fill his antiemetic scripts at [facility] with his Choice Plan Card benefit.  Education -  Interventions -Provided him the telephone # to call [facility] pharmacy to inquire with them.  Referrals -  Coordination of Care -Faxed last office note to his PCP at [facility] with Patient 15

## 2015-12-26 ENCOUNTER — Ambulatory Visit
Admission: RE | Admit: 2015-12-26 | Discharge: 2015-12-26 | Disposition: A | Discharge status: Home / Self Care | Payer: No Typology Code available for payment source | Source: Ambulatory Visit | Attending: Radiation Oncology | Admitting: Radiation Oncology

## 2015-12-26 ENCOUNTER — Encounter: Payer: Self-pay | Admitting: *Deleted

## 2015-12-26 ENCOUNTER — Ambulatory Visit (HOSPITAL_BASED_OUTPATIENT_CLINIC_OR_DEPARTMENT_OTHER): Payer: No Typology Code available for payment source

## 2015-12-26 ENCOUNTER — Other Ambulatory Visit (HOSPITAL_BASED_OUTPATIENT_CLINIC_OR_DEPARTMENT_OTHER): Payer: No Typology Code available for payment source

## 2015-12-26 VITALS — BP 134/55 | HR 56 | Temp 98.5°F | Resp 16

## 2015-12-26 DIAGNOSIS — C155 Malignant neoplasm of lower third of esophagus: Secondary | ICD-10-CM

## 2015-12-26 DIAGNOSIS — Z5111 Encounter for antineoplastic chemotherapy: Secondary | ICD-10-CM

## 2015-12-26 DIAGNOSIS — Z510 Encounter for antineoplastic radiation therapy: Secondary | ICD-10-CM | POA: Diagnosis not present

## 2015-12-26 LAB — CBC WITH DIFFERENTIAL/PLATELET
BASO%: 0.2 % (ref 0.0–2.0)
Basophils Absolute: 0 10*3/uL (ref 0.0–0.1)
EOS%: 0.8 % (ref 0.0–7.0)
Eosinophils Absolute: 0.1 10*3/uL (ref 0.0–0.5)
HCT: 42.3 % (ref 38.4–49.9)
HGB: 14.7 g/dL (ref 13.0–17.1)
LYMPH%: 14.1 % (ref 14.0–49.0)
MCH: 30.9 pg (ref 27.2–33.4)
MCHC: 34.8 g/dL (ref 32.0–36.0)
MCV: 89.1 fL (ref 79.3–98.0)
MONO#: 0.7 10*3/uL (ref 0.1–0.9)
MONO%: 8.1 % (ref 0.0–14.0)
NEUT%: 76.8 % — ABNORMAL HIGH (ref 39.0–75.0)
NEUTROS ABS: 6.8 10*3/uL — AB (ref 1.5–6.5)
Platelets: 222 10*3/uL (ref 140–400)
RBC: 4.75 10*6/uL (ref 4.20–5.82)
RDW: 13 % (ref 11.0–14.6)
WBC: 8.9 10*3/uL (ref 4.0–10.3)
lymph#: 1.3 10*3/uL (ref 0.9–3.3)

## 2015-12-26 LAB — COMPREHENSIVE METABOLIC PANEL
ALK PHOS: 77 U/L (ref 40–150)
ALT: 10 U/L (ref 0–55)
AST: 13 U/L (ref 5–34)
Albumin: 4.1 g/dL (ref 3.5–5.0)
Anion Gap: 8 mEq/L (ref 3–11)
BILIRUBIN TOTAL: 0.75 mg/dL (ref 0.20–1.20)
BUN: 8.6 mg/dL (ref 7.0–26.0)
CO2: 26 meq/L (ref 22–29)
Calcium: 9.5 mg/dL (ref 8.4–10.4)
Chloride: 109 mEq/L (ref 98–109)
Creatinine: 0.8 mg/dL (ref 0.7–1.3)
EGFR: 87 mL/min/{1.73_m2} — AB (ref >90)
GLUCOSE: 105 mg/dL (ref 70–140)
Potassium: 4 mEq/L (ref 3.5–5.1)
SODIUM: 143 meq/L (ref 136–145)
TOTAL PROTEIN: 7.1 g/dL (ref 6.4–8.3)

## 2015-12-26 MED ORDER — SODIUM CHLORIDE 0.9 % IV SOLN
45.0000 mg/m2 | Freq: Once | INTRAVENOUS | Status: AC
  Administered 2015-12-26: 78 mg via INTRAVENOUS
  Filled 2015-12-26: qty 13

## 2015-12-26 MED ORDER — SODIUM CHLORIDE 0.9 % IV SOLN
Freq: Once | INTRAVENOUS | Status: AC
  Administered 2015-12-26: 12:00:00 via INTRAVENOUS

## 2015-12-26 MED ORDER — DIPHENHYDRAMINE HCL 50 MG/ML IJ SOLN
50.0000 mg | Freq: Once | INTRAMUSCULAR | Status: AC
  Administered 2015-12-26: 50 mg via INTRAVENOUS

## 2015-12-26 MED ORDER — PALONOSETRON HCL INJECTION 0.25 MG/5ML
0.2500 mg | Freq: Once | INTRAVENOUS | Status: AC
  Administered 2015-12-26: 0.25 mg via INTRAVENOUS

## 2015-12-26 MED ORDER — FAMOTIDINE IN NACL 20-0.9 MG/50ML-% IV SOLN
20.0000 mg | Freq: Once | INTRAVENOUS | Status: AC
  Administered 2015-12-26: 20 mg via INTRAVENOUS

## 2015-12-26 MED ORDER — SODIUM CHLORIDE 0.9 % IV SOLN
160.0000 mg | Freq: Once | INTRAVENOUS | Status: AC
  Administered 2015-12-26: 160 mg via INTRAVENOUS
  Filled 2015-12-26: qty 16

## 2015-12-26 MED ORDER — SODIUM CHLORIDE 0.9 % IV SOLN
20.0000 mg | Freq: Once | INTRAVENOUS | Status: AC
  Administered 2015-12-26: 20 mg via INTRAVENOUS
  Filled 2015-12-26: qty 2

## 2015-12-26 MED ORDER — DIPHENHYDRAMINE HCL 50 MG/ML IJ SOLN
INTRAMUSCULAR | Status: AC
  Filled 2015-12-26: qty 1

## 2015-12-26 MED ORDER — PALONOSETRON HCL INJECTION 0.25 MG/5ML
INTRAVENOUS | Status: AC
  Filled 2015-12-26: qty 5

## 2015-12-26 MED ORDER — FAMOTIDINE IN NACL 20-0.9 MG/50ML-% IV SOLN
INTRAVENOUS | Status: AC
  Filled 2015-12-26: qty 50

## 2015-12-26 NOTE — Patient Instructions (Addendum)
[facility] Discharge Instructions for Patients Receiving Chemotherapy  Today you received the following chemotherapy agents:  Taxol and Carboplatin.  To help prevent nausea and vomiting after your treatment, we encourage you to take your nausea medication as prescribed.   If you develop nausea and vomiting that is not controlled by your nausea medication, call the clinic.   BELOW ARE SYMPTOMS THAT SHOULD BE REPORTED IMMEDIATELY:  *FEVER GREATER THAN 100.5 F  *CHILLS WITH OR WITHOUT FEVER  NAUSEA AND VOMITING THAT IS NOT CONTROLLED WITH YOUR NAUSEA MEDICATION  *UNUSUAL SHORTNESS OF BREATH  *UNUSUAL BRUISING OR BLEEDING  TENDERNESS IN MOUTH AND THROAT WITH OR WITHOUT PRESENCE OF ULCERS  *URINARY PROBLEMS  *BOWEL PROBLEMS  UNUSUAL RASH Items with * indicate a potential emergency and should be followed up as soon as possible.  Feel free to call the clinic you have any questions or concerns. The clinic phone number is [phone].  Please show the CHEMO ALERT CARD at check-in to the Emergency Department and triage nurse.  Paclitaxel injection What is this medicine? PACLITAXEL (PAK li TAX el) is a chemotherapy drug. It targets fast dividing cells, like cancer cells, and causes these cells to die. This medicine is used to treat ovarian cancer, breast cancer, and other cancers. This medicine may be used for other purposes; ask your health care provider or pharmacist if you have questions. What should I tell my health care provider before I take this medicine? They need to know if you have any of these conditions: -blood disorders -irregular heartbeat -infection (especially a virus infection such as chickenpox, cold sores, or herpes) -liver disease -previous or ongoing radiation therapy -an unusual or allergic reaction to paclitaxel, alcohol, polyoxyethylated castor oil, other chemotherapy agents, other medicines, foods, dyes, or preservatives -pregnant or  trying to get pregnant -breast-feeding How should I use this medicine? This drug is given as an infusion into a vein. It is administered in a hospital or clinic by a specially trained health care professional. Talk to your pediatrician regarding the use of this medicine in children. Special care may be needed. Overdosage: If you think you have taken too much of this medicine contact a poison control center or emergency room at once. NOTE: This medicine is only for you. Do not share this medicine with others. What if I miss a dose? It is important not to miss your dose. Call your doctor or health care professional if you are unable to keep an appointment. What may interact with this medicine? Do not take this medicine with any of the following medications: -disulfiram -metronidazole This medicine may also interact with the following medications: -cyclosporine -diazepam -ketoconazole -medicines to increase blood counts like filgrastim, pegfilgrastim, sargramostim -other chemotherapy drugs like cisplatin, doxorubicin, epirubicin, etoposide, teniposide, vincristine -quinidine -testosterone -vaccines -verapamil Talk to your doctor or health care professional before taking any of these medicines: -acetaminophen -aspirin -ibuprofen -ketoprofen -naproxen This list may not describe all possible interactions. Give your health care provider a list of all the medicines, herbs, non-prescription drugs, or dietary supplements you use. Also tell them if you smoke, drink alcohol, or use illegal drugs. Some items may interact with your medicine. What should I watch for while using this medicine? Your condition will be monitored carefully while you are receiving this medicine. You will need important blood work done while you are taking this medicine. This drug may make you feel generally unwell. This is not uncommon, as chemotherapy can affect healthy cells as   well as cancer cells. Report any side  effects. Continue your course of treatment even though you feel ill unless your doctor tells you to stop. This medicine can cause serious allergic reactions. To reduce your risk you will need to take other medicine(s) before treatment with this medicine. In some cases, you may be given additional medicines to help with side effects. Follow all directions for their use. Call your doctor or health care professional for advice if you get a fever, chills or sore throat, or other symptoms of a cold or flu. Do not treat yourself. This drug decreases your body's ability to fight infections. Try to avoid being around people who are sick. This medicine may increase your risk to bruise or bleed. Call your doctor or health care professional if you notice any unusual bleeding. Be careful brushing and flossing your teeth or using a toothpick because you may get an infection or bleed more easily. If you have any dental work done, tell your dentist you are receiving this medicine. Avoid taking products that contain aspirin, acetaminophen, ibuprofen, naproxen, or ketoprofen unless instructed by your doctor. These medicines may hide a fever. Do not become pregnant while taking this medicine. Women should inform their doctor if they wish to become pregnant or think they might be pregnant. There is a potential for serious side effects to an unborn child. Talk to your health care professional or pharmacist for more information. Do not breast-feed an infant while taking this medicine. Men are advised not to father a child while receiving this medicine. This product may contain alcohol. Ask your pharmacist or healthcare provider if this medicine contains alcohol. Be sure to tell all healthcare providers you are taking this medicine. Certain medicines, like metronidazole and disulfiram, can cause an unpleasant reaction when taken with alcohol. The reaction includes flushing, headache, nausea, vomiting, sweating, and increased  thirst. The reaction can last from 30 minutes to several hours. What side effects may I notice from receiving this medicine? Side effects that you should report to your doctor or health care professional as soon as possible: -allergic reactions like skin rash, itching or hives, swelling of the face, lips, or tongue -low blood counts - This drug may decrease the number of white blood cells, red blood cells and platelets. You may be at increased risk for infections and bleeding. -signs of infection - fever or chills, cough, sore throat, pain or difficulty passing urine -signs of decreased platelets or bleeding - bruising, pinpoint red spots on the skin, black, tarry stools, nosebleeds -signs of decreased red blood cells - unusually weak or tired, fainting spells, lightheadedness -breathing problems -chest pain -high or low blood pressure -mouth sores -nausea and vomiting -pain, swelling, redness or irritation at the injection site -pain, tingling, numbness in the hands or feet -slow or irregular heartbeat -swelling of the ankle, feet, hands Side effects that usually do not require medical attention (report to your doctor or health care professional if they continue or are bothersome): -bone pain -complete hair loss including hair on your head, underarms, pubic hair, eyebrows, and eyelashes -changes in the color of fingernails -diarrhea -loosening of the fingernails -loss of appetite -muscle or joint pain -red flush to skin -sweating This list may not describe all possible side effects. Call your doctor for medical advice about side effects. You may report side effects to FDA at 1-800-FDA-1088. Where should I keep my medicine? This drug is given in a hospital or clinic and will not be stored   at home. NOTE: This sheet is a summary. It may not cover all possible information. If you have questions about this medicine, talk to your doctor, pharmacist, or health care provider.    2016,  Elsevier/Gold Standard. (2015-02-24 13:02:56)  Carboplatin injection What is this medicine? CARBOPLATIN (KAR boe pla tin) is a chemotherapy drug. It targets fast dividing cells, like cancer cells, and causes these cells to die. This medicine is used to treat ovarian cancer and many other cancers. This medicine may be used for other purposes; ask your health care provider or pharmacist if you have questions. What should I tell my health care provider before I take this medicine? They need to know if you have any of these conditions: -blood disorders -hearing problems -kidney disease -recent or ongoing radiation therapy -an unusual or allergic reaction to carboplatin, cisplatin, other chemotherapy, other medicines, foods, dyes, or preservatives -pregnant or trying to get pregnant -breast-feeding How should I use this medicine? This drug is usually given as an infusion into a vein. It is administered in a hospital or clinic by a specially trained health care professional. Talk to your pediatrician regarding the use of this medicine in children. Special care may be needed. Overdosage: If you think you have taken too much of this medicine contact a poison control center or emergency room at once. NOTE: This medicine is only for you. Do not share this medicine with others. What if I miss a dose? It is important not to miss a dose. Call your doctor or health care professional if you are unable to keep an appointment. What may interact with this medicine? -medicines for seizures -medicines to increase blood counts like filgrastim, pegfilgrastim, sargramostim -some antibiotics like amikacin, gentamicin, neomycin, streptomycin, tobramycin -vaccines Talk to your doctor or health care professional before taking any of these medicines: -acetaminophen -aspirin -ibuprofen -ketoprofen -naproxen This list may not describe all possible interactions. Give your health care provider a list of all the  medicines, herbs, non-prescription drugs, or dietary supplements you use. Also tell them if you smoke, drink alcohol, or use illegal drugs. Some items may interact with your medicine. What should I watch for while using this medicine? Your condition will be monitored carefully while you are receiving this medicine. You will need important blood work done while you are taking this medicine. This drug may make you feel generally unwell. This is not uncommon, as chemotherapy can affect healthy cells as well as cancer cells. Report any side effects. Continue your course of treatment even though you feel ill unless your doctor tells you to stop. In some cases, you may be given additional medicines to help with side effects. Follow all directions for their use. Call your doctor or health care professional for advice if you get a fever, chills or sore throat, or other symptoms of a cold or flu. Do not treat yourself. This drug decreases your body's ability to fight infections. Try to avoid being around people who are sick. This medicine may increase your risk to bruise or bleed. Call your doctor or health care professional if you notice any unusual bleeding. Be careful brushing and flossing your teeth or using a toothpick because you may get an infection or bleed more easily. If you have any dental work done, tell your dentist you are receiving this medicine. Avoid taking products that contain aspirin, acetaminophen, ibuprofen, naproxen, or ketoprofen unless instructed by your doctor. These medicines may hide a fever. Do not become pregnant while taking this   medicine. Women should inform their doctor if they wish to become pregnant or think they might be pregnant. There is a potential for serious side effects to an unborn child. Talk to your health care professional or pharmacist for more information. Do not breast-feed an infant while taking this medicine. What side effects may I notice from receiving this  medicine? Side effects that you should report to your doctor or health care professional as soon as possible: -allergic reactions like skin rash, itching or hives, swelling of the face, lips, or tongue -signs of infection - fever or chills, cough, sore throat, pain or difficulty passing urine -signs of decreased platelets or bleeding - bruising, pinpoint red spots on the skin, black, tarry stools, nosebleeds -signs of decreased red blood cells - unusually weak or tired, fainting spells, lightheadedness -breathing problems -changes in hearing -changes in vision -chest pain -high blood pressure -low blood counts - This drug may decrease the number of white blood cells, red blood cells and platelets. You may be at increased risk for infections and bleeding. -nausea and vomiting -pain, swelling, redness or irritation at the injection site -pain, tingling, numbness in the hands or feet -problems with balance, talking, walking -trouble passing urine or change in the amount of urine Side effects that usually do not require medical attention (report to your doctor or health care professional if they continue or are bothersome): -hair loss -loss of appetite -metallic taste in the mouth or changes in taste This list may not describe all possible side effects. Call your doctor for medical advice about side effects. You may report side effects to FDA at 1-800-FDA-1088. Where should I keep my medicine? This drug is given in a hospital or clinic and will not be stored at home. NOTE: This sheet is a summary. It may not cover all possible information. If you have questions about this medicine, talk to your doctor, pharmacist, or health care provider.    2016, Elsevier/Gold Standard. (2007-10-14 14:38:05)    

## 2015-12-26 NOTE — Progress Notes (Signed)
Oncology Nurse Navigator Documentation  Oncology Nurse Navigator Flowsheets 12/26/2015  Navigator Location [facility]  Navigator Encounter Type Treatment  Telephone -  Abnormal Finding Date -  Confirmed Diagnosis Date -  Treatment Initiated Date 12/26/2015  Patient Visit Type MedOnc  Treatment Phase First Chemo Tx;First Radiation Tx--Taxol/Carboplatin  Barriers/Navigation Needs Coordination of Care;Education--asking if he can take some herbal "natural" supplements his daughter has purchased for him.  Education Other--herbal supplements are not studied with chemotherapy, so interactions are not known and could be harmful. Wife will bring in copy of the ingredients for MD to review to get her opinion.  Interventions Coordination of Care  Referrals -  Coordination of Care Appts--requests 2nd opinion with Dr. Servando Snare for surgery  Education Method Verbal  Support Groups/Services -  Acuity Level 2  Time Spent with Patient 15  Referral to Dr. Servando Snare placed at [facility] from Dr. Burr Medico. Left VM with his [facility] Coordinator, Misti Harris to determine if it requires authorization from [place] to be a covered visit.

## 2015-12-27 ENCOUNTER — Ambulatory Visit
Admission: RE | Admit: 2015-12-27 | Discharge: 2015-12-27 | Disposition: A | Discharge status: Home / Self Care | Payer: No Typology Code available for payment source | Source: Ambulatory Visit | Attending: Radiation Oncology | Admitting: Radiation Oncology

## 2015-12-27 ENCOUNTER — Telehealth: Payer: Self-pay

## 2015-12-27 DIAGNOSIS — Z510 Encounter for antineoplastic radiation therapy: Secondary | ICD-10-CM | POA: Diagnosis not present

## 2015-12-27 NOTE — Telephone Encounter (Signed)
-----   Message from Sinda Du, RN sent at 12/26/2015  2:52 PM EDT ----- Regarding: Dr. Burr Medico - 1st chemo f/u 1st chemo f/u

## 2015-12-27 NOTE — Telephone Encounter (Signed)
Called [name] for chemotherapy F/U.  Left message for pt to call if they have any questions or concerns or any new symptoms to report, along with the phone number.

## 2015-12-28 ENCOUNTER — Ambulatory Visit
Admission: RE | Admit: 2015-12-28 | Discharge: 2015-12-28 | Disposition: A | Discharge status: Home / Self Care | Payer: No Typology Code available for payment source | Source: Ambulatory Visit | Attending: Radiation Oncology | Admitting: Radiation Oncology

## 2015-12-28 DIAGNOSIS — Z510 Encounter for antineoplastic radiation therapy: Secondary | ICD-10-CM | POA: Diagnosis not present

## 2015-12-28 DIAGNOSIS — C155 Malignant neoplasm of lower third of esophagus: Secondary | ICD-10-CM | POA: Insufficient documentation

## 2015-12-28 MED ORDER — SONAFINE EX EMUL
1.0000 "application " | Freq: Two times a day (BID) | CUTANEOUS | Status: DC
  Administered 2015-12-28: 1 via TOPICAL

## 2015-12-28 NOTE — Progress Notes (Signed)
Patient education done, Radiation therapy and you book, my business card and sonafine cream given to the patient, discussed ways to manage side effects, skin irritation, throat changes, fatigue, loss hair on area treated only, loss appetite, loss weight, may need to have IVF'S, difficulty swallowing, esophagitis, may need to eat softer foods, drink high calorie drinks foods, ensure/boost,etc, may need to eat 5-6 smaller meals and snacks in between meals, may need carafte as treatment causes the feeling of food getting stuck, sonafine cream to be applied to affected skin treatment area  After radiation daily and prn, just nothing 4 hours prior to radiation, patient already eating softer foods, and bread is hardest to get down stated, puts gravies on his buscuits and is tolerable stated then, verbal understanding, teach back given, he sees B.Cordella Register, the dietician tomorrow 10:44 AM

## 2015-12-29 ENCOUNTER — Ambulatory Visit: Payer: No Typology Code available for payment source | Admitting: Nutrition

## 2015-12-29 ENCOUNTER — Ambulatory Visit
Admission: RE | Admit: 2015-12-29 | Discharge: 2015-12-29 | Disposition: A | Discharge status: Home / Self Care | Payer: No Typology Code available for payment source | Source: Ambulatory Visit | Attending: Radiation Oncology | Admitting: Radiation Oncology

## 2015-12-29 ENCOUNTER — Telehealth: Payer: Self-pay | Admitting: *Deleted

## 2015-12-29 DIAGNOSIS — Z510 Encounter for antineoplastic radiation therapy: Secondary | ICD-10-CM | POA: Diagnosis not present

## 2015-12-29 NOTE — Progress Notes (Signed)
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  He is a patient of Dr. Burr Medico.  Past medical history includes hypertension, and GERD.  Medications include Prilosec, Zofran, Compazine, Zocor, and vitamin D.  Labs were reviewed.  Height: 65.5 inches. Weight: 139 pounds June 1. Usual body weight: 145-150 pounds. BMI: 22.77.  Patient reports difficulty swallowing and has been eating softer foods with gravies. He has some painful swallowing with high acid foods. He is drinking one to 2 oral nutrition supplements daily. Enjoys milk shakes. Patient denies other nutrition impact symptoms.  Nutrition diagnosis: Unintended weight loss related to dysphasia as evidenced by 6 pound weight loss from usual body weight.  Intervention:  Patient was educated to increase calories and protein in small frequent meals and snacks daily. Recommended patient increase oral nutrition supplements 3 times a day between meals. Educated patient on soft moist protein foods. Provided education on food safety. Fact sheets were given.  Questions were answered.  Teach back method used.  Contact information was given.  Monitoring, evaluation, goals: Patient will tolerate increased calories and protein to minimize weight loss.  Next visit: Monday, June 19, during infusion.  **Disclaimer: This note was dictated with voice recognition software. Similar sounding words can inadvertently be transcribed and this note may contain transcription errors which may not have been corrected upon publication of note.**

## 2015-12-29 NOTE — Telephone Encounter (Signed)
Oncology Nurse Navigator Documentation  Oncology Nurse Navigator Flowsheets 12/29/2015  Navigator Location [facility]  Navigator Encounter Type Telephone  Telephone Patient Update--wife asking how long he needs to avoid large crowds? Is it OK to take Tylenol for headache? Daughter has purchased bottle of "Get Healthy Again" ingredients are purified water and organic essential oils and rosemary. Asking if OK to take this?  Abnormal Finding Date -  Confirmed Diagnosis Date -  Treatment Initiated Date -  Patient Visit Type -  Treatment Phase -  Barriers/Navigation Needs Education--informed her to maintain this precaution throughout his treatment, mostly crowds in enclosed areas (church is OK, just sit further away); OK to take Tylenol; doubt the supplement is harmful, but doubt it will help. Will get opinion from MD>  Education -  Interventions Other--message to MD.  Referrals -  Coordination of Care -  Education Method Verbal  Support Groups/Services -  Acuity -  Time Spent with Patient 15

## 2015-12-30 ENCOUNTER — Institutional Professional Consult (permissible substitution) (INDEPENDENT_AMBULATORY_CARE_PROVIDER_SITE_OTHER): Payer: Medicare Other | Admitting: Cardiothoracic Surgery

## 2015-12-30 ENCOUNTER — Ambulatory Visit
Admission: RE | Admit: 2015-12-30 | Discharge: 2015-12-30 | Disposition: A | Discharge status: Home / Self Care | Payer: No Typology Code available for payment source | Source: Ambulatory Visit | Attending: Radiation Oncology | Admitting: Radiation Oncology

## 2015-12-30 ENCOUNTER — Encounter: Payer: Self-pay | Admitting: Radiation Oncology

## 2015-12-30 ENCOUNTER — Encounter: Payer: Self-pay | Admitting: Cardiothoracic Surgery

## 2015-12-30 VITALS — BP 129/72 | HR 73 | Temp 98.2°F | Resp 20 | Wt 137.4 lb

## 2015-12-30 VITALS — BP 116/71 | HR 85 | Resp 16 | Ht 65.0 in | Wt 140.0 lb

## 2015-12-30 DIAGNOSIS — C155 Malignant neoplasm of lower third of esophagus: Secondary | ICD-10-CM | POA: Diagnosis not present

## 2015-12-30 DIAGNOSIS — Z510 Encounter for antineoplastic radiation therapy: Secondary | ICD-10-CM | POA: Diagnosis not present

## 2015-12-30 NOTE — Progress Notes (Signed)
wekly rad txs esophagus 5/28 completed, ate oatmeal and coffee this am, eating soft foods, no c/o throat pain, sees Dr. Servando Snare today at noon, appetite fair,  10:20 AM BP 129/72 mmHg  Pulse 73  Temp(Src) 98.2 F (36.8 C) (Oral)  Resp 20  Wt 137 lb 6.4 oz (62.324 kg)  Wt Readings from Last 3 Encounters:  12/30/15 137 lb 6.4 oz (62.324 kg)  12/22/15 139 lb (63.05 kg)  12/14/15 141 lb (63.957 kg)

## 2015-12-30 NOTE — Telephone Encounter (Signed)
  Oncology Nurse Navigator Documentation  Navigator Location: [facility] (12/30/15 1100) Navigator Encounter Type: Telephone (12/30/15 1100) Telephone: Outgoing Call;Patient Update (12/30/15 1100)   Notified patient that Dr. Burr Medico said it would be OK to take the herbal supplements his daughter obtained; however he needs to hold them on day of and day after each chemo treatment. He verbalized understanding and agreement.

## 2015-12-30 NOTE — Progress Notes (Signed)
Fred [name]       Fred [name],Fred [name] [id number]             [phone]                    Fred [name] [place] Medical Record #[id number] Date of Birth: 01-03-1941  Referring: Fred Coombe, MD Primary Care: No primary care provider on file.  Chief Complaint:    Chief Complaint  Patient presents with  . Esophageal Cancer    eval for surgery...CT/PET/BX/EUS/EGD    History of Present Illness:    Fred [name] 75 y.o. male is seen in the office His request is a second surgical opinion concerning his recently diagnosed adenocarcinoma the distal esophagus. The patient gives a history of many years of reflux symptoms taking acid blocking medications. For the past 3 months he had noted increasing difficulty swallowing specially solid food and bread. He was referred by the [facility] to [place] were upper GI endoscopy was performed a near obstructing mass 6 cm lower third of the esophagus was noted. 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 By EUS criteria the lesion was T3 with breaks into the muscularis propria total thickness 9 mm, there was present regional lymph nodes, staged N1. . The patient started radiation and chemotherapy at [facility] 5 days ago.  He notes his weight now is approximately 132 pounds down from 150, having lost 10-12 pounds over the past 3 months.  There is a family history of gastric cancer in his maternal grandfather, patient's father died of a stroke in his 79s mother died in her 37s of Alzheimer.   Patient notes that when his symptoms first began he had chest discomfort and was referred to the [facility] ER from the [facility], he notes a stress  test and echocardiogram were done at the [place] per his report he was told he had no cardiac issues Current Activity/ Functional Status:  Patient is independent with mobility/ambulation, transfers, ADL's, IADL's.   Zubrod Score: At the time of surgery this patient's most appropriate activity status/level should be described as: _0     0    Normal activity, no symptoms _1     1    Restricted in physical strenuous activity but ambulatory, able to do out light work _2     2    Ambulatory and capable of self care, unable to do work activities, up and about               >50 % of waking hours                              _3     3    Only limited self care, in bed greater than 50% of waking hours _4     4    Completely disabled, no self care, confined to bed or chair _5     5    Moribund   Past Medical History  Diagnosis Date  . Esophageal cancer ([place]) 11/23/15    lower 3rd esohagus   . Hypertension   . GERD (gastroesophageal reflux disease)     Past Surgical  History  Procedure Laterality Date  . Right shoulder surgery    . Prostate biopsy  10/05/15  . Cystoscope  4/12/1    prostate    Family History  Problem Relation Age of Onset  . Cancer Maternal Grandfather 51    gastric cancer    Stomach cancer Maternal Grandfather   Social History   Social History  . Marital Status: Married    Spouse Name: N/A  . Number of Children: N/A  . Years of Education: N/A   Occupational History  . Not on file.   Social History Main Topics  . Smoking status: Former Smoker -- 1.00 packs/day for 10 years    Quit date: 07/24/1967  . Smokeless tobacco: Not on file  . Alcohol Use: No  . Drug Use: No  . Sexual Activity: Not Currently   Other Topics Concern  . Not on file   Social History Narrative    History  Smoking status  . Former Smoker -- 1.00 packs/day for 10 years  . Quit date: 07/24/1967  Smokeless tobacco  . Not on file   History  Alcohol Use No     No Known  Allergies  Current Outpatient Prescriptions  Medication Sig Dispense Refill  . acetaminophen (TYLENOL) 500 MG tablet Take 500 mg by mouth every 6 (six) hours as needed.    Marland Kitchen atenolol (TENORMIN) 25 MG tablet Take 25 mg by mouth daily.    Marland Kitchen omeprazole (PRILOSEC) 20 MG capsule Take 20 mg by mouth daily.    . ondansetron (ZOFRAN) 8 MG tablet Take 1 tablet (8 mg total) by mouth every 8 (eight) hours as needed for nausea. 30 tablet 3  . prochlorperazine (COMPAZINE) 10 MG tablet Take 1 tablet (10 mg total) by mouth every 6 (six) hours as needed. 30 tablet 3  . simvastatin (ZOCOR) 20 MG tablet Take 20 mg by mouth daily at 6 PM. 1/2 tablet    . Vitamin D, Cholecalciferol, 1000 units TABS Take 1,000 Units by mouth 2 (two) times daily. D3     No current facility-administered medications for this visit.      Review of Systems:     Cardiac Review of Systems: Y or N  Chest Pain [y evaluated at [place] last month ]  Resting SOB [ n  ] Exertional SOB  [n  ]  Orthopnea [ n ]   Pedal Edema [ n  ]    Palpitations [ n ] Syncope  [n  ]   Presyncope [ n  ]  General Review of Systems: [Y] = yes [  ]=no Constitional: recent weight change [  ];  Wt loss over the last 3 months [   ] anorexia [  ]; fatigue [  ]; nausea [  ]; night sweats [  ]; fever [  ]; or chills [  ];          Dental: poor dentition[  ]; Last Dentist visit:   Eye : blurred vision [  ]; diplopia [   ]; vision changes [  ];  Amaurosis fugax[  ]; Resp: cough [  ];  wheezing[  ];  hemoptysis[  ]; shortness of breath[  ]; paroxysmal nocturnal dyspnea[  ]; dyspnea on exertion[  ]; or orthopnea[  ];  GI:  gallstones[  ], vomiting[  ];  dysphagia[ y ]; [place] Muta  ];  hematochezia [  ]; heartburn[  ];   Hx of  Colonoscopy[ y ]; GU: kidney stones [  ];  hematuria[  ];   dysuria [  ];  nocturia[  ];  history of     obstruction [  ]; urinary frequency Blue.Reese  ]             Skin: rash, swelling[  ];, hair loss[  ];  peripheral edema[  ];  or itching[   ]; Musculosketetal: myalgias[  ];  joint swelling[  ];  joint erythema[  ];  joint pain[  ];  back pain[  ];  Heme/Lymph: bruising[  ];  bleeding[  ];  anemia[  ];  Neuro: TIA[  ];  headaches[  ];  stroke[  ];  vertigo[  ];  seizures[  ];   paresthesias[  ];  difficulty walking[chronic injury to left leg  ];  Psych:depression[n  ]; anxiety[ n ];  Endocrine: diabetes[  ];  thyroid dysfunction[  ];  Immunizations: Flu up to date [ y]; Pneumococcal up to date [  y];  Other:  Physical Exam: BP 116/71 mmHg  Pulse 85  Resp 16  Ht _0  (1.651 m)  Wt 140 lb (63.504 kg)  BMI 23.30 kg/m2  SpO2 98%  PHYSICAL EXAMINATION: General appearance: alert, cooperative and no distress Head: Normocephalic, without obvious abnormality, atraumatic Neck: no adenopathy, no carotid bruit, no JVD, supple, symmetrical, trachea midline and thyroid not enlarged, symmetric, no tenderness/mass/nodules Lymph nodes: Cervical, supraclavicular, and axillary nodes normal. Resp: clear to auscultation bilaterally Back: symmetric, no curvature. ROM normal. No CVA tenderness. Cardio: regular rate and rhythm, S1, S2 normal, no murmur, click, rub or gallop GI: soft, non-tender; bowel sounds normal; no masses,  no organomegaly Extremities: extremities normal, atraumatic, no cyanosis or edema Neurologic: Grossly normal  Diagnostic Studies & Laboratory data:     Recent Lab Findings: Lab Results  Component Value Date   WBC 8.9 12/26/2015   HGB 14.7 12/26/2015   HCT 42.3 12/26/2015   PLT 222 12/26/2015   GLUCOSE 105 12/26/2015   ALT 10 12/26/2015   AST 13 12/26/2015   NA 143 12/26/2015   K 4.0 12/26/2015   CREATININE 0.8 12/26/2015   BUN 8.6 12/26/2015   CO2 26 12/26/2015   CT A/P with contrast 12/01/2015 Marked circumferential mid to distal esophageal wall thickening, no enlarged thoracic nodes or evidence of lung mets Small varices along the GE junction Periportal nodes measuring less than a centimeter. There  is prominent upper abdominal node adjacent to the splenic vein/SMV confluence measuring 15x7m 13 mm left adrenal indeterminate nodule. Hypodense scattered liver lesions, the largest measuring 8 mm in the right, too small to characterize Enlarged heterogeneously enhancing prostate with ill-defined margins  12/15/2015 EGD/EUS 12/15/2015 Mass noted at 27CM from the incisors, by EUS criteria the lesion is T3 with breaks in the muscularis propria, total thickness 9 mm. Based on the presene of 1-2 metastatic regional lymph nodes, N staging was N1. The radial EUS would not pass through and did not dilate given clinical information already gleaned.  PATHOLOGY: Lower third of esophagus mass biopsy 11/23/2015 Mucinous adenocarcinoma, moderately differentiated.   PET:  IMPRESSION:  1.  Distal esophageal hypermetabolic mass consistent with known malignancy. 2.  Hypermetabolic activity in the left anterior prostate gland, correlate for malignancy. 3.  Metabolic density left adrenal nodule. 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 However, given history of malignancy, metastatic disease is not excluded. 4.  Left apical 2 mm pulmonary nodule, too small for evaluation by PET.   Result Narrative  INDICATION:  C15.5: Malignant neoplasm of lower third of esophagus  TECHNIQUE: 7.1 millicuries of L-94 FDG was administered intravenously. PET imaging was obtained from the skull base to the mid thighs. CT images were obtained for attenuation correction and localization purposes. Glucose level was 103 MG/DL. Time from  injection to scan was 62 minutes.  Comparison none.  FINDINGS:  Hypermetabolic distal esophageal mass max SUV 8.5 (image 118) consistent with known malignancy.  Hypermetabolic activity within the left anterior prostate Max SUV 19.8, no mass identified by CT.  Low density left adrenal nodule measuring 1.4 cm (image 146). CT attenuation suggests a benign  adrenal adenoma, the nodule is hypermetabolic Max SUV 4.2.  Remaining activity in the body is normal and physiologic. Additional findings: Left apical 2 mm pulmonary nodule (image 66) 2 small for evaluation by PET. Gallbladder sludge. No wall thickening. Bilateral renal cysts. Nonobstructing mid left renal stone. Colonic diverticulosis   I have independently reviewed the above radiology studies  and reviewed the findings with the patient.   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 I discussed in detail with the patient his wife and daughter the diagnosis probable clinical stage of adenocarcinoma the distal esophagus. We've discussed the risks and options of surgical approach, including transhiatal versus Ivor  Lewis.  There questions were answered in detail. Currently the patient lives just a few miles from, and hospital, is getting his chemotherapy and radiation at [facility] and if possible [facility]. Plan to see him back at the completion of his radiation therapy we will then arrange a time for follow-up CT scan of the chest and abdomen for restaging and consider timing of esophageal resection possible transhiatal at that time.      I  spent 40 minutes counseling the patient face to face and 50% or more the  time was spent in counseling and coordination of care. The total time spent in the appointment was 60 minutes.  [name] MD      [name] [place] [address] Office [phone]   Beeper [phone]  12/30/2015 1:35 PM

## 2016-01-01 NOTE — Progress Notes (Signed)
   Department of Radiation Oncology  Phone:  [phone] Fax:        [phone]  Weekly Treatment Note    Name: Fred [name] Date: 01/01/2016 MRN: [id number] DOB: [birth year]   Diagnosis:     ICD-9-CM ICD-10-CM   1. Malignant neoplasm of lower third of esophagus (HCC) 150.5 C15.5      Current dose: 9 Gy  Current fraction: 5   MEDICATIONS: Current Outpatient Prescriptions  Medication Sig Dispense Refill  . acetaminophen (TYLENOL) 500 MG tablet Take 500 mg by mouth every 6 (six) hours as needed.    Marland Kitchen atenolol (TENORMIN) 25 MG tablet Take 25 mg by mouth daily.    Marland Kitchen omeprazole (PRILOSEC) 20 MG capsule Take 20 mg by mouth daily.    . ondansetron (ZOFRAN) 8 MG tablet Take 1 tablet (8 mg total) by mouth every 8 (eight) hours as needed for nausea. 30 tablet 3  . prochlorperazine (COMPAZINE) 10 MG tablet Take 1 tablet (10 mg total) by mouth every 6 (six) hours as needed. 30 tablet 3  . simvastatin (ZOCOR) 20 MG tablet Take 20 mg by mouth daily at 6 PM. 1/2 tablet    . Vitamin D, Cholecalciferol, 1000 units TABS Take 1,000 Units by mouth 2 (two) times daily. D3     No current facility-administered medications for this encounter.     ALLERGIES: Review of patient's allergies indicates no known allergies.   LABORATORY DATA:  Lab Results  Component Value Date   WBC 8.9 12/26/2015   HGB 14.7 12/26/2015   HCT 42.3 12/26/2015   MCV 89.1 12/26/2015   PLT 222 12/26/2015   Lab Results  Component Value Date   NA 143 12/26/2015   K 4.0 12/26/2015   CO2 26 12/26/2015   Lab Results  Component Value Date   ALT 10 12/26/2015   AST 13 12/26/2015   ALKPHOS 77 12/26/2015   BILITOT 0.75 12/26/2015     NARRATIVE: Fred [name] was seen today for weekly treatment management. The chart was checked and the patient's films were reviewed.  wekly rad txs esophagus 5/28 completed, ate oatmeal and coffee this am, eating soft foods, no c/o throat pain, sees Dr. Servando Snare today at noon,  appetite fair,  2:22 PM BP 129/72 mmHg  Pulse 73  Temp(Src) 98.2 F (36.8 C) (Oral)  Resp 20  Wt 137 lb 6.4 oz (62.324 kg)  Wt Readings from Last 3 Encounters:  12/30/15 140 lb (63.504 kg)  12/30/15 137 lb 6.4 oz (62.324 kg)  12/22/15 139 lb (63.05 kg)    PHYSICAL EXAMINATION: weight is 137 lb 6.4 oz (62.324 kg). His oral temperature is 98.2 F (36.8 C). His blood pressure is 129/72 and his pulse is 73. His respiration is 20.        ASSESSMENT: The patient is doing satisfactorily with treatment.  PLAN: We will continue with the patient's radiation treatment as planned.

## 2016-01-02 ENCOUNTER — Encounter: Payer: Self-pay | Admitting: Nurse Practitioner

## 2016-01-02 ENCOUNTER — Ambulatory Visit
Admission: RE | Admit: 2016-01-02 | Discharge: 2016-01-02 | Disposition: A | Discharge status: Home / Self Care | Payer: No Typology Code available for payment source | Source: Ambulatory Visit | Attending: Radiation Oncology | Admitting: Radiation Oncology

## 2016-01-02 ENCOUNTER — Other Ambulatory Visit (HOSPITAL_BASED_OUTPATIENT_CLINIC_OR_DEPARTMENT_OTHER): Payer: No Typology Code available for payment source

## 2016-01-02 ENCOUNTER — Ambulatory Visit (HOSPITAL_BASED_OUTPATIENT_CLINIC_OR_DEPARTMENT_OTHER): Payer: No Typology Code available for payment source

## 2016-01-02 ENCOUNTER — Ambulatory Visit (HOSPITAL_BASED_OUTPATIENT_CLINIC_OR_DEPARTMENT_OTHER): Payer: No Typology Code available for payment source | Admitting: Nurse Practitioner

## 2016-01-02 VITALS — BP 138/62 | HR 68 | Temp 97.8°F | Resp 17 | Wt 135.6 lb

## 2016-01-02 DIAGNOSIS — C155 Malignant neoplasm of lower third of esophagus: Secondary | ICD-10-CM

## 2016-01-02 DIAGNOSIS — Z510 Encounter for antineoplastic radiation therapy: Secondary | ICD-10-CM | POA: Diagnosis not present

## 2016-01-02 DIAGNOSIS — Z5111 Encounter for antineoplastic chemotherapy: Secondary | ICD-10-CM

## 2016-01-02 LAB — CBC WITH DIFFERENTIAL/PLATELET
BASO%: 0.6 % (ref 0.0–2.0)
Basophils Absolute: 0 10*3/uL (ref 0.0–0.1)
EOS%: 2.1 % (ref 0.0–7.0)
Eosinophils Absolute: 0.1 10*3/uL (ref 0.0–0.5)
HCT: 42.6 % (ref 38.4–49.9)
HGB: 14.6 g/dL (ref 13.0–17.1)
LYMPH%: 9.7 % — AB (ref 14.0–49.0)
MCH: 30.8 pg (ref 27.2–33.4)
MCHC: 34.4 g/dL (ref 32.0–36.0)
MCV: 89.7 fL (ref 79.3–98.0)
MONO#: 0.5 10*3/uL (ref 0.1–0.9)
MONO%: 7.7 % (ref 0.0–14.0)
NEUT%: 79.9 % — ABNORMAL HIGH (ref 39.0–75.0)
NEUTROS ABS: 4.9 10*3/uL (ref 1.5–6.5)
PLATELETS: 215 10*3/uL (ref 140–400)
RBC: 4.75 10*6/uL (ref 4.20–5.82)
RDW: 13.5 % (ref 11.0–14.6)
WBC: 6.1 10*3/uL (ref 4.0–10.3)
lymph#: 0.6 10*3/uL — ABNORMAL LOW (ref 0.9–3.3)

## 2016-01-02 LAB — COMPREHENSIVE METABOLIC PANEL
ALT: 10 U/L (ref 0–55)
AST: 16 U/L (ref 5–34)
Albumin: 3.9 g/dL (ref 3.5–5.0)
Alkaline Phosphatase: 73 U/L (ref 40–150)
Anion Gap: 6 mEq/L (ref 3–11)
BILIRUBIN TOTAL: 0.75 mg/dL (ref 0.20–1.20)
BUN: 11.2 mg/dL (ref 7.0–26.0)
CO2: 26 meq/L (ref 22–29)
CREATININE: 0.7 mg/dL (ref 0.7–1.3)
Calcium: 9.3 mg/dL (ref 8.4–10.4)
Chloride: 106 mEq/L (ref 98–109)
EGFR: >90 mL/min/{1.73_m2} (ref >90)
GLUCOSE: 107 mg/dL (ref 70–140)
Potassium: 4.2 mEq/L (ref 3.5–5.1)
SODIUM: 139 meq/L (ref 136–145)
TOTAL PROTEIN: 6.7 g/dL (ref 6.4–8.3)

## 2016-01-02 MED ORDER — SODIUM CHLORIDE 0.9 % IV SOLN
45.0000 mg/m2 | Freq: Once | INTRAVENOUS | Status: AC
  Administered 2016-01-02: 78 mg via INTRAVENOUS
  Filled 2016-01-02: qty 13

## 2016-01-02 MED ORDER — DIPHENHYDRAMINE HCL 50 MG/ML IJ SOLN
INTRAMUSCULAR | Status: AC
  Filled 2016-01-02: qty 1

## 2016-01-02 MED ORDER — FAMOTIDINE IN NACL 20-0.9 MG/50ML-% IV SOLN
20.0000 mg | Freq: Once | INTRAVENOUS | Status: AC
  Administered 2016-01-02: 20 mg via INTRAVENOUS

## 2016-01-02 MED ORDER — SODIUM CHLORIDE 0.9 % IV SOLN
160.0000 mg | Freq: Once | INTRAVENOUS | Status: AC
  Administered 2016-01-02: 160 mg via INTRAVENOUS
  Filled 2016-01-02: qty 16

## 2016-01-02 MED ORDER — DIPHENHYDRAMINE HCL 50 MG/ML IJ SOLN
50.0000 mg | Freq: Once | INTRAMUSCULAR | Status: AC
  Administered 2016-01-02: 50 mg via INTRAVENOUS

## 2016-01-02 MED ORDER — FAMOTIDINE IN NACL 20-0.9 MG/50ML-% IV SOLN
INTRAVENOUS | Status: AC
  Filled 2016-01-02: qty 50

## 2016-01-02 MED ORDER — PALONOSETRON HCL INJECTION 0.25 MG/5ML
INTRAVENOUS | Status: AC
  Filled 2016-01-02: qty 5

## 2016-01-02 MED ORDER — SODIUM CHLORIDE 0.9 % IV SOLN
20.0000 mg | Freq: Once | INTRAVENOUS | Status: AC
  Administered 2016-01-02: 20 mg via INTRAVENOUS
  Filled 2016-01-02: qty 2

## 2016-01-02 MED ORDER — PALONOSETRON HCL INJECTION 0.25 MG/5ML
0.2500 mg | Freq: Once | INTRAVENOUS | Status: AC
  Administered 2016-01-02: 0.25 mg via INTRAVENOUS

## 2016-01-02 MED ORDER — SODIUM CHLORIDE 0.9 % IV SOLN
Freq: Once | INTRAVENOUS | Status: AC
  Administered 2016-01-02: 12:00:00 via INTRAVENOUS

## 2016-01-02 NOTE — Patient Instructions (Signed)
[facility] Discharge Instructions for Patients Receiving Chemotherapy  Today you received the following chemotherapy agents Taxol and Carboplatin.  To help prevent nausea and vomiting after your treatment, we encourage you to take your nausea medication as prescribed.   If you develop nausea and vomiting that is not controlled by your nausea medication, call the clinic.   BELOW ARE SYMPTOMS THAT SHOULD BE REPORTED IMMEDIATELY:  *FEVER GREATER THAN 100.5 F  *CHILLS WITH OR WITHOUT FEVER  NAUSEA AND VOMITING THAT IS NOT CONTROLLED WITH YOUR NAUSEA MEDICATION  *UNUSUAL SHORTNESS OF BREATH  *UNUSUAL BRUISING OR BLEEDING  TENDERNESS IN MOUTH AND THROAT WITH OR WITHOUT PRESENCE OF ULCERS  *URINARY PROBLEMS  *BOWEL PROBLEMS  UNUSUAL RASH Items with * indicate a potential emergency and should be followed up as soon as possible.  Feel free to call the clinic you have any questions or concerns. The clinic phone number is [phone].  Please show the CHEMO ALERT CARD at check-in to the Emergency Department and triage nurse.   

## 2016-01-02 NOTE — Progress Notes (Signed)
**Fred Fred**   Fred Fred   Diagnosis:  Esophageal adenocarcinoma Oncology History   Presented with solid dysphagia at least daily with chest pain. Involuntary weight loss of 10-12 lb over past year  Malignant neoplasm of lower third of esophagus (HCC)  Staging form: Esophagus - Adenocarcinoma, AJCC 7th Edition  Clinical stage from 12/15/2015: Stage IIIA (T3, N1, M0) - Signed by Truitt Merle, MD on 12/24/2015       Malignant neoplasm of lower third of esophagus ([place])   11/23/2015 Procedure EGD per [facility](Dr. Toledo):6cm mass in lower third of esophagus   11/24/2015 Initial Diagnosis Esophageal cancer ([place])   11/24/2015 Pathology Results Mucinous adenocarcinoma, moderately differentiated-Her2 analysis pending   12/05/2015 Imaging PET scan showed hypermetabolic a mass in distal esophagus, hypermetabolic activity in the left anterior prostate gland, metabolic density in left adrenal nodule, CT attenuation suggest a benign adrenal adenoma. Left apical 2 mm lung nodule.   12/15/2015 Procedure EUS showed a uT3N1 lesion from 27cm to 35cm from the incisors         INTERVAL HISTORY:   Fred Fred returns as scheduled. He began radiation and concurrent carboplatin/Taxol chemotherapy 12/26/2015.He denies nausea/vomiting. No mouth sores. No diarrhea. He developed constipation over the weekend. He reports persistent dysphagia. He is tolerating a soft diet. He is drinking nutritional supplements.  Objective:  Vital signs in last 24 hours:  Blood pressure 138/62, pulse 68, temperature 97.8 F (36.6 C), temperature source Oral, resp. rate 17, weight 135 lb 9.6 oz (61.508 kg), SpO2 100 %.    HEENT: No thrush or ulcers. Resp: Lungs clear bilaterally. Cardio: Regular rate and rhythm. GI: Abdomen soft and nontender. No hepatomegaly. Vascular: No leg edema.  Lab Results:  Lab Results  Component Value Date   WBC 6.1 01/02/2016   HGB  14.6 01/02/2016   HCT 42.6 01/02/2016   MCV 89.7 01/02/2016   PLT 215 01/02/2016   NEUTROABS 4.9 01/02/2016    Imaging:  No results found.  Medications: I have reviewed the patient's current medications.  Assessment/Plan: 1. Distal esophageal adenocarcinoma, [id number]. PET scan showed no definitive distant metastasis. Left adrenal gland mass probably a benign adenoma based on CT characteristic. By EUS he has T3 N1 stage III disease. Concurrent neoadjuvant radiation/chemotherapy initiated 12/26/2015. 2. Hypertension.    Disposition: Fred Fred appears stable. He began concurrent radiation/chemotherapy 12/26/2015. Plan to proceed with week 2 Taxol/carboplatin today as scheduled. We will see him in follow-up in one week.  For the constipation he will try Miralax. He will contact the office if this is not effective.    Ned Card ANP/GNP-BC   01/02/2016  10:55 AM

## 2016-01-03 ENCOUNTER — Other Ambulatory Visit: Payer: Self-pay | Admitting: Hematology

## 2016-01-03 ENCOUNTER — Ambulatory Visit
Admission: RE | Admit: 2016-01-03 | Discharge: 2016-01-03 | Disposition: A | Discharge status: Home / Self Care | Payer: No Typology Code available for payment source | Source: Ambulatory Visit | Attending: Radiation Oncology | Admitting: Radiation Oncology

## 2016-01-03 DIAGNOSIS — Z510 Encounter for antineoplastic radiation therapy: Secondary | ICD-10-CM | POA: Diagnosis not present

## 2016-01-04 ENCOUNTER — Ambulatory Visit
Admission: RE | Admit: 2016-01-04 | Discharge: 2016-01-04 | Disposition: A | Discharge status: Home / Self Care | Payer: No Typology Code available for payment source | Source: Ambulatory Visit | Attending: Radiation Oncology | Admitting: Radiation Oncology

## 2016-01-04 DIAGNOSIS — C155 Malignant neoplasm of lower third of esophagus: Secondary | ICD-10-CM

## 2016-01-04 DIAGNOSIS — Z510 Encounter for antineoplastic radiation therapy: Secondary | ICD-10-CM | POA: Diagnosis not present

## 2016-01-04 NOTE — Progress Notes (Signed)
   Department of Radiation Oncology  Phone:  [phone] Fax:        [phone]  Weekly Treatment Note    Name: Fred [name] Date: 01/04/2016 MRN: [id number] DOB: 03/04/1941   Diagnosis:     ICD-9-CM ICD-10-CM   1. Malignant neoplasm of lower third of esophagus (HCC) 150.5 C15.5      Current dose: 14.4 Gy  Current fraction: 8   MEDICATIONS: Current Outpatient Prescriptions  Medication Sig Dispense Refill  . acetaminophen (TYLENOL) 500 MG tablet Take 500 mg by mouth every 6 (six) hours as needed.    Marland Kitchen atenolol (TENORMIN) 25 MG tablet Take 25 mg by mouth daily.    Marland Kitchen omeprazole (PRILOSEC) 20 MG capsule Take 20 mg by mouth daily.    . ondansetron (ZOFRAN) 8 MG tablet Take 1 tablet (8 mg total) by mouth every 8 (eight) hours as needed for nausea. (Patient not taking: Reported on 01/02/2016) 30 tablet 3  . prochlorperazine (COMPAZINE) 10 MG tablet Take 1 tablet (10 mg total) by mouth every 6 (six) hours as needed. 30 tablet 3  . simvastatin (ZOCOR) 20 MG tablet Take 20 mg by mouth daily at 6 PM. 1/2 tablet    . Vitamin D, Cholecalciferol, 1000 units TABS Take 1,000 Units by mouth 2 (two) times daily. D3     No current facility-administered medications for this encounter.     ALLERGIES: Review of patient's allergies indicates no known allergies.   LABORATORY DATA:  Lab Results  Component Value Date   WBC 6.1 01/02/2016   HGB 14.6 01/02/2016   HCT 42.6 01/02/2016   MCV 89.7 01/02/2016   PLT 215 01/02/2016   Lab Results  Component Value Date   NA 139 01/02/2016   K 4.2 01/02/2016   CO2 26 01/02/2016   Lab Results  Component Value Date   ALT 10 01/02/2016   AST 16 01/02/2016   ALKPHOS 73 01/02/2016   BILITOT 0.75 01/02/2016     NARRATIVE: Fred [name] was seen today for weekly treatment management. The chart was checked and the patient's films were reviewed.  He reports difficulty swallowing solid food. He is drinking Ensure and drinking soups. He denies  dizziness or pain.  12:20 PM There were no vitals taken for this visit.  Wt Readings from Last 3 Encounters:  01/02/16 135 lb 9.6 oz (61.508 kg)  12/30/15 140 lb (63.504 kg)  12/30/15 137 lb 6.4 oz (62.324 kg)    PHYSICAL EXAMINATION: vitals were not taken for this visit.       ASSESSMENT: The patient is doing satisfactorily with treatment.  PLAN: We will continue with the patient's radiation treatment as planned.   ------------------------------------------------  Jodelle Gross, MD, PhD  This document serves as a record of services personally performed by Kyung Rudd, MD. It was created on his behalf by Darcus Austin, a trained medical scribe. The creation of this record is based on the scribe's personal observations and the provider's statements to them. This document has been checked and approved by the attending provider.

## 2016-01-04 NOTE — Progress Notes (Signed)
MD saw patient on machine not sent to nursing for assessment There were no vitals taken for this visit.

## 2016-01-05 ENCOUNTER — Ambulatory Visit
Admission: RE | Admit: 2016-01-05 | Discharge: 2016-01-05 | Disposition: A | Discharge status: Home / Self Care | Payer: No Typology Code available for payment source | Source: Ambulatory Visit | Attending: Radiation Oncology | Admitting: Radiation Oncology

## 2016-01-05 DIAGNOSIS — Z510 Encounter for antineoplastic radiation therapy: Secondary | ICD-10-CM | POA: Diagnosis not present

## 2016-01-06 ENCOUNTER — Telehealth: Payer: Self-pay | Admitting: *Deleted

## 2016-01-06 ENCOUNTER — Ambulatory Visit
Admission: RE | Admit: 2016-01-06 | Discharge: 2016-01-06 | Disposition: A | Discharge status: Home / Self Care | Payer: No Typology Code available for payment source | Source: Ambulatory Visit | Attending: Radiation Oncology | Admitting: Radiation Oncology

## 2016-01-06 ENCOUNTER — Other Ambulatory Visit: Payer: Self-pay | Admitting: Radiation Oncology

## 2016-01-06 DIAGNOSIS — Z510 Encounter for antineoplastic radiation therapy: Secondary | ICD-10-CM | POA: Diagnosis not present

## 2016-01-06 MED ORDER — SUCRALFATE 1 G PO TABS: 1.0000 g | ORAL_TABLET | Freq: Four times a day (QID) | ORAL | Status: DC

## 2016-01-06 NOTE — Telephone Encounter (Signed)
Patient told Fred [name] Therapist he is having difficyty swallowing even ensure, throat is burning, wants something  called to [facility] on [address], he doesn't have carafte as yet, [facility] willinbasket Dr. Lisbeth Renshaw and call patient  After getting word from MD 10:09 AM

## 2016-01-06 NOTE — Telephone Encounter (Signed)
Patient completd ct simulation sent back to [facility] ehab via [facility]

## 2016-01-06 NOTE — Telephone Encounter (Signed)
Called patient  carafte has been called in gave instruction sof use,  Take 1 tablet dissolve in 30 ml water, take 30 minutes before each meal and at bedtime,  Has 2 refills patient thanked MD and this RN for calling 11:23 AM

## 2016-01-09 ENCOUNTER — Other Ambulatory Visit (HOSPITAL_BASED_OUTPATIENT_CLINIC_OR_DEPARTMENT_OTHER): Payer: No Typology Code available for payment source

## 2016-01-09 ENCOUNTER — Ambulatory Visit (HOSPITAL_BASED_OUTPATIENT_CLINIC_OR_DEPARTMENT_OTHER): Payer: No Typology Code available for payment source | Admitting: Nurse Practitioner

## 2016-01-09 ENCOUNTER — Ambulatory Visit: Payer: No Typology Code available for payment source | Admitting: Nutrition

## 2016-01-09 ENCOUNTER — Ambulatory Visit
Admission: RE | Admit: 2016-01-09 | Discharge: 2016-01-09 | Disposition: A | Discharge status: Home / Self Care | Payer: No Typology Code available for payment source | Source: Ambulatory Visit | Attending: Radiation Oncology | Admitting: Radiation Oncology

## 2016-01-09 ENCOUNTER — Encounter: Payer: Self-pay | Admitting: *Deleted

## 2016-01-09 ENCOUNTER — Ambulatory Visit (HOSPITAL_BASED_OUTPATIENT_CLINIC_OR_DEPARTMENT_OTHER): Payer: No Typology Code available for payment source

## 2016-01-09 VITALS — BP 115/55 | HR 76 | Temp 98.2°F | Resp 18 | Wt 134.0 lb

## 2016-01-09 DIAGNOSIS — Z510 Encounter for antineoplastic radiation therapy: Secondary | ICD-10-CM | POA: Diagnosis not present

## 2016-01-09 DIAGNOSIS — C155 Malignant neoplasm of lower third of esophagus: Secondary | ICD-10-CM

## 2016-01-09 DIAGNOSIS — Z5111 Encounter for antineoplastic chemotherapy: Secondary | ICD-10-CM

## 2016-01-09 LAB — CBC WITH DIFFERENTIAL/PLATELET
BASO%: 0.3 % (ref 0.0–2.0)
Basophils Absolute: 0 10*3/uL (ref 0.0–0.1)
EOS%: 2.2 % (ref 0.0–7.0)
Eosinophils Absolute: 0.1 10*3/uL (ref 0.0–0.5)
HCT: 42.8 % (ref 38.4–49.9)
HGB: 14.8 g/dL (ref 13.0–17.1)
LYMPH%: 6.1 % — AB (ref 14.0–49.0)
MCH: 30.6 pg (ref 27.2–33.4)
MCHC: 34.5 g/dL (ref 32.0–36.0)
MCV: 88.7 fL (ref 79.3–98.0)
MONO#: 0.6 10*3/uL (ref 0.1–0.9)
MONO%: 9.5 % (ref 0.0–14.0)
NEUT#: 5 10*3/uL (ref 1.5–6.5)
NEUT%: 81.9 % — ABNORMAL HIGH (ref 39.0–75.0)
Platelets: 164 10*3/uL (ref 140–400)
RBC: 4.83 10*6/uL (ref 4.20–5.82)
RDW: 13.3 % (ref 11.0–14.6)
WBC: 6.2 10*3/uL (ref 4.0–10.3)
lymph#: 0.4 10*3/uL — ABNORMAL LOW (ref 0.9–3.3)

## 2016-01-09 LAB — COMPREHENSIVE METABOLIC PANEL
ALT: 12 U/L (ref 0–55)
AST: 15 U/L (ref 5–34)
Albumin: 3.7 g/dL (ref 3.5–5.0)
Alkaline Phosphatase: 71 U/L (ref 40–150)
Anion Gap: 8 mEq/L (ref 3–11)
BUN: 12.1 mg/dL (ref 7.0–26.0)
CHLORIDE: 106 meq/L (ref 98–109)
CO2: 26 mEq/L (ref 22–29)
Calcium: 9.3 mg/dL (ref 8.4–10.4)
Creatinine: 0.7 mg/dL (ref 0.7–1.3)
EGFR: >90 mL/min/{1.73_m2} (ref >90)
GLUCOSE: 113 mg/dL (ref 70–140)
POTASSIUM: 4.3 meq/L (ref 3.5–5.1)
SODIUM: 140 meq/L (ref 136–145)
Total Bilirubin: 0.68 mg/dL (ref 0.20–1.20)
Total Protein: 6.4 g/dL (ref 6.4–8.3)

## 2016-01-09 MED ORDER — FAMOTIDINE IN NACL 20-0.9 MG/50ML-% IV SOLN
20.0000 mg | Freq: Once | INTRAVENOUS | Status: AC
  Administered 2016-01-09: 20 mg via INTRAVENOUS

## 2016-01-09 MED ORDER — PALONOSETRON HCL INJECTION 0.25 MG/5ML
0.2500 mg | Freq: Once | INTRAVENOUS | Status: AC
  Administered 2016-01-09: 0.25 mg via INTRAVENOUS

## 2016-01-09 MED ORDER — SODIUM CHLORIDE 0.9 % IV SOLN
Freq: Once | INTRAVENOUS | Status: AC
  Administered 2016-01-09: 13:00:00 via INTRAVENOUS

## 2016-01-09 MED ORDER — SODIUM CHLORIDE 0.9 % IV SOLN
45.0000 mg/m2 | Freq: Once | INTRAVENOUS | Status: AC
  Administered 2016-01-09: 78 mg via INTRAVENOUS
  Filled 2016-01-09: qty 13

## 2016-01-09 MED ORDER — PALONOSETRON HCL INJECTION 0.25 MG/5ML
INTRAVENOUS | Status: AC
  Filled 2016-01-09: qty 5

## 2016-01-09 MED ORDER — DIPHENHYDRAMINE HCL 50 MG/ML IJ SOLN
50.0000 mg | Freq: Once | INTRAMUSCULAR | Status: AC
  Administered 2016-01-09: 50 mg via INTRAVENOUS

## 2016-01-09 MED ORDER — SODIUM CHLORIDE 0.9 % IV SOLN
164.0000 mg | Freq: Once | INTRAVENOUS | Status: AC
  Administered 2016-01-09: 160 mg via INTRAVENOUS
  Filled 2016-01-09: qty 16

## 2016-01-09 MED ORDER — FAMOTIDINE IN NACL 20-0.9 MG/50ML-% IV SOLN
INTRAVENOUS | Status: AC
  Filled 2016-01-09: qty 50

## 2016-01-09 MED ORDER — SODIUM CHLORIDE 0.9 % IV SOLN
20.0000 mg | Freq: Once | INTRAVENOUS | Status: AC
  Administered 2016-01-09: 20 mg via INTRAVENOUS
  Filled 2016-01-09: qty 2

## 2016-01-09 MED ORDER — DIPHENHYDRAMINE HCL 50 MG/ML IJ SOLN
INTRAMUSCULAR | Status: AC
  Filled 2016-01-09: qty 1

## 2016-01-09 NOTE — Progress Notes (Signed)
Nutrition follow-up completed with patient diagnosed with esophageal cancer. Weight has decreased was documented as 134 pounds June 19, down from 139 pounds June 1. Patient continues to have painful and difficulty swallowing. Is drinking one to 2 oral nutrition supplements a day.  In addition to a milk shake. Patient has blenderized some foods and swallowed without difficulty.  Estimated nutrition needs: 1820-2135 calories, 75-90 grams protein, greater than 2 L fluid.  Nutrition diagnosis: Unintended weight loss continues.  Intervention: Reviewed importance of patient increasing soft moist foods.  Reviewed possible snack suggestions. Recommended patient consume between 4 and 5 ensure plus or equivalent supplements daily. Provided coupons. Questions were answered.  Teach back method used.  Monitoring, evaluation, goals: Patient will tolerate increased calories, protein, and fluids to promote weight stabilization.  Next visit: Monday, June 26 during infusion.  **Disclaimer: This note was dictated with voice recognition software. Similar sounding words can inadvertently be transcribed and this note may contain transcription errors which may not have been corrected upon publication of note.**

## 2016-01-09 NOTE — Progress Notes (Signed)
Oncology Nurse Navigator Documentation  Oncology Nurse Navigator Flowsheets 01/09/2016  Navigator Location [facility]  Navigator Encounter Type Telephone  Telephone -  Abnormal Finding Date 11/23/2015  Confirmed Diagnosis Date 11/23/2015  Treatment Initiated Date -  Patient Visit Type MedOnc  Treatment Phase Active Tx--RT w/weekly Carbo/Taxol  Barriers/Navigation Needs No barriers at this time;No Questions;No Needs  Education -  Interventions Other--Encouraged to drink high protein/calorie drinks  Referrals -  Coordination of Care -  Education Method Verbal  Support Groups/Services -  Acuity Level 1  Time Spent with Patient 15  Reports visit with Dr. Servando Snare went well and he wants him to do his surgery. His [facility] nurse navigator is working on getting this approved from the PCP. Some painful swallowing-has script for Carafate. Encouraged him to use this and make a slurry.

## 2016-01-09 NOTE — Progress Notes (Signed)
Patient returned from using the bathroom with IV partially pulled out. Patient was receiving Taxol at the time and an area the size of a dime was wet. Patient's IV stopped and IV removed. The area was washed with soap and water. New IV placed in right arm and Taxol continues. Patient instructed to let staff know if he has discomfort at the site.

## 2016-01-09 NOTE — Progress Notes (Signed)
  [facility] OFFICE PROGRESS NOTE   Diagnosis: Esophageal adenocarcinoma Oncology History   Presented with solid dysphagia at least daily with chest pain. Involuntary weight loss of 10-12 lb over past year  Malignant neoplasm of lower third of esophagus (HCC)  Staging form: Esophagus - Adenocarcinoma, AJCC 7th Edition  Clinical stage from 12/15/2015: Stage IIIA (T3, N1, M0) - Signed by Truitt Merle, MD on 12/24/2015       Malignant neoplasm of lower third of esophagus ([place])   11/23/2015 Procedure EGD per [facility](Dr. Toledo):6cm mass in lower third of esophagus   11/24/2015 Initial Diagnosis Esophageal cancer ([place])   11/24/2015 Pathology Results Mucinous adenocarcinoma, moderately differentiated-Her2 analysis pending   12/05/2015 Imaging PET scan showed hypermetabolic a mass in distal esophagus, hypermetabolic activity in the left anterior prostate gland, metabolic density in left adrenal nodule, CT attenuation suggest a benign adrenal adenoma. Left apical 2 mm lung nodule.   12/15/2015 Procedure EUS showed a uT3N1 lesion from 27cm to 35cm from the incisors                INTERVAL HISTORY:   Fred [name] returns as scheduled.He continues radiation. He completed week 2 carboplatin/Taxol chemotherapy 01/02/2016. He denies nausea/vomiting. No mouth sores. He had recent constipation. This was relieved with a laxative. No diarrhea. No numbness or tingling in his hands or feet. He notes a "burning" discomfort with swallowing. He has noted some improvement since beginning Carafate. At present he is tolerating liquids.  Objective:  Vital signs in last 24 hours:  Blood pressure 115/55, pulse 76, temperature 98.2 F (36.8 C), temperature source Oral, resp. rate 18, weight 134 lb (60.782 kg), SpO2 100 %.    HEENT: No thrush or ulcers. Resp: Lungs clear bilaterally. Cardio: Regular rate and rhythm. GI: Abdomen  soft and nontender. No hepatomegaly. Vascular: No leg edema.   Lab Results:  Lab Results  Component Value Date   WBC 6.2 01/09/2016   HGB 14.8 01/09/2016   HCT 42.8 01/09/2016   MCV 88.7 01/09/2016   PLT 164 01/09/2016   NEUTROABS 5.0 01/09/2016    Imaging:  No results found.  Medications: I have reviewed the patient's current medications.  Assessment/Plan: 1. Distal esophageal adenocarcinoma, [id number]. PET scan showed no definitive distant metastasis. Left adrenal gland mass probably a benign adenoma based on CT characteristic. By EUS he has T3 N1 stage III disease. Concurrent neoadjuvant radiation/chemotherapy initiated 12/26/2015. 2. Hypertension.   Disposition:Fred [name] appears stable. He continues radiation. Plan to proceed with week 3 Taxol/carboplatin today as scheduled.  He has developed dysphagia related to the radiation. He notes some improvement with Carafate. At present he is tolerating fluids. He understands to notify the office if he is not able to tolerate fluids.  He will return for a follow-up visit in one week.    Ned Card ANP/GNP-BC   01/09/2016  12:03 PM

## 2016-01-10 ENCOUNTER — Ambulatory Visit
Admission: RE | Admit: 2016-01-10 | Discharge: 2016-01-10 | Disposition: A | Discharge status: Home / Self Care | Payer: No Typology Code available for payment source | Source: Ambulatory Visit | Attending: Radiation Oncology | Admitting: Radiation Oncology

## 2016-01-10 DIAGNOSIS — Z510 Encounter for antineoplastic radiation therapy: Secondary | ICD-10-CM | POA: Diagnosis not present

## 2016-01-11 ENCOUNTER — Ambulatory Visit
Admission: RE | Admit: 2016-01-11 | Discharge: 2016-01-11 | Disposition: A | Discharge status: Home / Self Care | Payer: No Typology Code available for payment source | Source: Ambulatory Visit | Attending: Radiation Oncology | Admitting: Radiation Oncology

## 2016-01-11 DIAGNOSIS — Z510 Encounter for antineoplastic radiation therapy: Secondary | ICD-10-CM | POA: Diagnosis not present

## 2016-01-12 ENCOUNTER — Ambulatory Visit
Admission: RE | Admit: 2016-01-12 | Discharge: 2016-01-12 | Disposition: A | Discharge status: Home / Self Care | Payer: No Typology Code available for payment source | Source: Ambulatory Visit | Attending: Radiation Oncology | Admitting: Radiation Oncology

## 2016-01-12 DIAGNOSIS — Z510 Encounter for antineoplastic radiation therapy: Secondary | ICD-10-CM | POA: Diagnosis not present

## 2016-01-13 ENCOUNTER — Encounter: Payer: Self-pay | Admitting: Radiation Oncology

## 2016-01-13 ENCOUNTER — Ambulatory Visit
Admission: RE | Admit: 2016-01-13 | Discharge: 2016-01-13 | Disposition: A | Discharge status: Home / Self Care | Payer: No Typology Code available for payment source | Source: Ambulatory Visit | Attending: Radiation Oncology | Admitting: Radiation Oncology

## 2016-01-13 VITALS — BP 124/60 | HR 75 | Temp 97.8°F | Resp 18

## 2016-01-13 DIAGNOSIS — R1310 Dysphagia, unspecified: Secondary | ICD-10-CM

## 2016-01-13 DIAGNOSIS — C155 Malignant neoplasm of lower third of esophagus: Secondary | ICD-10-CM

## 2016-01-13 DIAGNOSIS — Z510 Encounter for antineoplastic radiation therapy: Secondary | ICD-10-CM | POA: Diagnosis not present

## 2016-01-13 MED ORDER — HYDROCODONE-ACETAMINOPHEN 7.5-325 MG/15ML PO SOLN: 15.0000 mL | ORAL | Status: DC | PRN

## 2016-01-13 NOTE — Progress Notes (Signed)
Patient seen by Shona Simpson,

## 2016-01-13 NOTE — Progress Notes (Signed)
  Radiation Oncology         [id number]   Name: Fred [name] MRN: [id number]   Date: 01/13/2016  DOB: 06-21-1941   Weekly Radiation Therapy Management    ICD-9-CM ICD-10-CM   1. Dysphagia 787.20 R13.10 HYDROcodone-acetaminophen (HYCET) 7.5-325 mg/15 ml solution  2. Malignant neoplasm of lower third of esophagus (HCC) 150.5 C15.5 HYDROcodone-acetaminophen (HYCET) 7.5-325 mg/15 ml solution    Current Dose: 27 Gy  Planned Dose:  50.4 Gy  Narrative The patient presents for routine under treatment assessment.  The patient reports burning when he drinks or eats. He's using Carafate, but state's it doesn't help anymore. He's trying to supplement with Ensure. He reports having TURP surgery in March.  Set-up films were reviewed. The chart was checked.  Physical Findings  oral temperature is 97.8 F (36.6 C). His blood pressure is 124/60 and his pulse is 75. His respiration is 18 and oxygen saturation is 100%. . The patient lost 6lbs since the beginning of this month. No thrush noted in the oral cavity.  Impression The patient is tolerating radiation.  Plan Continue treatment as planned. Lortab Elixer was prescribed to help with the patient's pain with swallowing. If that is unsuccessful, we may consider Diflucan or Nystatin to treat a possible yeast infection. I advised the patient to keep up his nutrition intake as this is important for recovery.      Sheral Apley Tammi Klippel, M.D.  This document serves as a record of services personally performed by Tyler Pita, MD. It was created on his behalf by Darcus Austin, a trained medical scribe. The creation of this record is based on the scribe's personal observations and the provider's statements to them. This document has been checked and approved by the attending provider.

## 2016-01-16 ENCOUNTER — Other Ambulatory Visit (HOSPITAL_BASED_OUTPATIENT_CLINIC_OR_DEPARTMENT_OTHER): Payer: No Typology Code available for payment source

## 2016-01-16 ENCOUNTER — Ambulatory Visit: Payer: No Typology Code available for payment source | Admitting: Nutrition

## 2016-01-16 ENCOUNTER — Ambulatory Visit (HOSPITAL_BASED_OUTPATIENT_CLINIC_OR_DEPARTMENT_OTHER): Payer: No Typology Code available for payment source

## 2016-01-16 ENCOUNTER — Ambulatory Visit
Admission: RE | Admit: 2016-01-16 | Discharge: 2016-01-16 | Disposition: A | Discharge status: Home / Self Care | Payer: No Typology Code available for payment source | Source: Ambulatory Visit | Attending: Radiation Oncology | Admitting: Radiation Oncology

## 2016-01-16 ENCOUNTER — Encounter: Payer: Self-pay | Admitting: Hematology

## 2016-01-16 ENCOUNTER — Ambulatory Visit (HOSPITAL_BASED_OUTPATIENT_CLINIC_OR_DEPARTMENT_OTHER): Payer: No Typology Code available for payment source | Admitting: Hematology

## 2016-01-16 VITALS — BP 140/72 | HR 97 | Temp 98.4°F | Resp 18 | Ht 65.0 in | Wt 133.1 lb

## 2016-01-16 DIAGNOSIS — I10 Essential (primary) hypertension: Secondary | ICD-10-CM | POA: Diagnosis not present

## 2016-01-16 DIAGNOSIS — Z5111 Encounter for antineoplastic chemotherapy: Secondary | ICD-10-CM | POA: Diagnosis not present

## 2016-01-16 DIAGNOSIS — C155 Malignant neoplasm of lower third of esophagus: Secondary | ICD-10-CM

## 2016-01-16 DIAGNOSIS — Z510 Encounter for antineoplastic radiation therapy: Secondary | ICD-10-CM | POA: Diagnosis not present

## 2016-01-16 DIAGNOSIS — R110 Nausea: Secondary | ICD-10-CM | POA: Diagnosis not present

## 2016-01-16 DIAGNOSIS — R634 Abnormal weight loss: Secondary | ICD-10-CM

## 2016-01-16 LAB — COMPREHENSIVE METABOLIC PANEL
ALBUMIN: 3.6 g/dL (ref 3.5–5.0)
ALK PHOS: 69 U/L (ref 40–150)
ALT: 13 U/L (ref 0–55)
AST: 15 U/L (ref 5–34)
Anion Gap: 8 mEq/L (ref 3–11)
BILIRUBIN TOTAL: 0.53 mg/dL (ref 0.20–1.20)
BUN: 15.8 mg/dL (ref 7.0–26.0)
CO2: 28 meq/L (ref 22–29)
Calcium: 9.4 mg/dL (ref 8.4–10.4)
Chloride: 105 mEq/L (ref 98–109)
Creatinine: 0.7 mg/dL (ref 0.7–1.3)
EGFR: >90 mL/min/{1.73_m2} (ref >90)
GLUCOSE: 124 mg/dL (ref 70–140)
POTASSIUM: 4.2 meq/L (ref 3.5–5.1)
SODIUM: 141 meq/L (ref 136–145)
TOTAL PROTEIN: 6.4 g/dL (ref 6.4–8.3)

## 2016-01-16 LAB — CBC WITH DIFFERENTIAL/PLATELET
BASO%: 0.7 % (ref 0.0–2.0)
Basophils Absolute: 0 10*3/uL (ref 0.0–0.1)
EOS%: 1.4 % (ref 0.0–7.0)
Eosinophils Absolute: 0.1 10*3/uL (ref 0.0–0.5)
HCT: 42.4 % (ref 38.4–49.9)
HEMOGLOBIN: 14.5 g/dL (ref 13.0–17.1)
LYMPH%: 4.2 % — ABNORMAL LOW (ref 14.0–49.0)
MCH: 30.5 pg (ref 27.2–33.4)
MCHC: 34.2 g/dL (ref 32.0–36.0)
MCV: 89.2 fL (ref 79.3–98.0)
MONO#: 0.6 10*3/uL (ref 0.1–0.9)
MONO%: 11.9 % (ref 0.0–14.0)
NEUT%: 81.8 % — ABNORMAL HIGH (ref 39.0–75.0)
NEUTROS ABS: 3.8 10*3/uL (ref 1.5–6.5)
Platelets: 148 10*3/uL (ref 140–400)
RBC: 4.76 10*6/uL (ref 4.20–5.82)
RDW: 13.4 % (ref 11.0–14.6)
WBC: 4.7 10*3/uL (ref 4.0–10.3)
lymph#: 0.2 10*3/uL — ABNORMAL LOW (ref 0.9–3.3)

## 2016-01-16 MED ORDER — DIPHENHYDRAMINE HCL 50 MG/ML IJ SOLN
50.0000 mg | Freq: Once | INTRAMUSCULAR | Status: AC
  Administered 2016-01-16: 50 mg via INTRAVENOUS

## 2016-01-16 MED ORDER — DIPHENHYDRAMINE HCL 50 MG/ML IJ SOLN
INTRAMUSCULAR | Status: AC
  Filled 2016-01-16: qty 1

## 2016-01-16 MED ORDER — SODIUM CHLORIDE 0.9 % IV SOLN
164.0000 mg | Freq: Once | INTRAVENOUS | Status: AC
  Administered 2016-01-16: 160 mg via INTRAVENOUS
  Filled 2016-01-16: qty 16

## 2016-01-16 MED ORDER — SODIUM CHLORIDE 0.9 % IV SOLN
Freq: Once | INTRAVENOUS | Status: AC
  Administered 2016-01-16: 13:00:00 via INTRAVENOUS

## 2016-01-16 MED ORDER — FAMOTIDINE IN NACL 20-0.9 MG/50ML-% IV SOLN
INTRAVENOUS | Status: AC
  Filled 2016-01-16: qty 50

## 2016-01-16 MED ORDER — PALONOSETRON HCL INJECTION 0.25 MG/5ML
0.2500 mg | Freq: Once | INTRAVENOUS | Status: AC
  Administered 2016-01-16: 0.25 mg via INTRAVENOUS

## 2016-01-16 MED ORDER — DEXAMETHASONE SODIUM PHOSPHATE 100 MG/10ML IJ SOLN
20.0000 mg | Freq: Once | INTRAMUSCULAR | Status: AC
  Administered 2016-01-16: 20 mg via INTRAVENOUS
  Filled 2016-01-16: qty 2

## 2016-01-16 MED ORDER — PALONOSETRON HCL INJECTION 0.25 MG/5ML
INTRAVENOUS | Status: AC
  Filled 2016-01-16: qty 5

## 2016-01-16 MED ORDER — PACLITAXEL CHEMO INJECTION 300 MG/50ML
45.0000 mg/m2 | Freq: Once | INTRAVENOUS | Status: AC
  Administered 2016-01-16: 78 mg via INTRAVENOUS
  Filled 2016-01-16: qty 13

## 2016-01-16 MED ORDER — FAMOTIDINE IN NACL 20-0.9 MG/50ML-% IV SOLN
20.0000 mg | Freq: Once | INTRAVENOUS | Status: AC
Start: 2016-01-16 — End: 2016-01-16
  Administered 2016-01-16: 20 mg via INTRAVENOUS

## 2016-01-16 NOTE — Progress Notes (Signed)
Nutrition follow up completed with patient and wife during infusion for esophageal cancer. Weight decreased and documented as 133.1 pounds on June 26, decreased from 134 pounds on June 19. Patient reports difficulty swallowing and tries to eat what he can. He continues to drink Ensure Plus, usually 4 daily. Reports mild nausea. Patient reports he is not allowed to eat or drink anything for 3 hours prior to radiation therapy.  Estimated Nutrition needs: 1820 - 2135 kcal, 75-90 grams protein, greater than 2 L fluid.  Nutrition Diagnosis: Unintended weight loss continues.  Intervention:  Reviewed importance of working to increase calories and fluid intake throughout the day. Stressed importance of increasing Ensure Plus to 5 daily with decreased oral intake. Clarified "order" for patient to not drink or eat 3 hours before radiation. Dr. Lisbeth Renshaw has now allowed patient to drink an Ensure Plus 2 hours prior to radiation. Educated patient and encouraged patient to increase fluids after radiation. Teach back method used.  Monitoring, Evaluation, Goals: Patient will tolerate increased calories and protein to maintain weight.  Next Visit:Monday, June 3, during infusion.

## 2016-01-16 NOTE — Progress Notes (Signed)
[facility]  [phone] [phone]  Clinic Follow up Note   Patient Care Team: Antoinette Wymer, MD as PCP - General (Internal Medicine) Yan Feng, MD as Consulting Physician (Hematology) John Moody, MD as Consulting Physician (Radiation Oncology) Edward B Gerhardt, MD as Consulting Physician (Cardiothoracic Surgery)   CHIEF COMPLAINTS:  Follow up esophageal adenocarcinoma  Oncology History   Presented with solid dysphagia at least daily with chest pain. Involuntary weight loss of 10-12 lb over past year  Malignant neoplasm of lower third of esophagus (HCC)   Staging form: Esophagus - Adenocarcinoma, AJCC 7th Edition     Clinical stage from 12/15/2015: Stage IIIA (T3, N1, M0) - Signed by Yan Feng, MD on 12/24/2015       Malignant neoplasm of lower third of esophagus (HCC)   11/23/2015 Procedure EGD per [facility](Dr. Toledo):6cm mass in lower third of esophagus   11/24/2015 Initial Diagnosis Esophageal cancer (HCC)   11/24/2015 Pathology Results Mucinous adenocarcinoma, moderately differentiated-Her2 analysis pending   12/05/2015 Imaging PET scan showed hypermetabolic a mass in distal esophagus, hypermetabolic activity in the left anterior prostate gland, metabolic density in left adrenal nodule, CT attenuation suggest a benign adrenal adenoma. Left apical 2 mm lung nodule.   12/15/2015 Procedure EUS showed a uT3N1 lesion from 27cm to 35cm from the incisors    12/26/2015 -  Chemotherapy Weekly carboplatin AUC 2 and Taxol 45 mg/m, with concurrent radiation   12/26/2015 -  Radiation Therapy Neoadjuvant chemoradiation to the esophageal cancer    HISTORY OF PRESENTING ILLNESS (12/14/2015):  Fred [name] 75 y.o. male is here because of His newly diagnosed esophageal cancer. He is accompanied by his wife and daughter to my clinic today.  He has had dysphagia for 2 months, mainly with solid food, no dysphagia with soft food or liquid. He also  has mild pain in mid chest when he swallows. No nausea, abdominal pain, or other discomfort. He has lost about 15 lbs in the last year, no other symoptoms. [facility] was [facility], and referred to gastroenterologist Dr. Toledo. He underwent EGD on 11/23/2015, which showed a 6 cm mass in the lower third of esophagus. Biopsy showed adenocarcinoma, moderately differentiated. He was referred to us to consider neoadjuvant therapy. He is scheduled to see a surgeon at [facility] later this week.  He has had some urinary frequency, underwent TURP on 11/02/2015.   CURRENT THERAPY: pending concurrent chemoRT with Weekly carboplatin AUC 2 and Taxol 45mg/m2, started on 12/26/2015   INTERIM HISTORY: Mr. Lapier returns for follow up. He is on week 4 chemo and RT. He has been tolerating treatment well overall, he has mild intermittent nausea, no vomiting, he takes Zofran or Compazine which helps. His odynophagia and chest pain has been getting worse lately, he has been taking hydrocodone which was prescribed by Dr. Moody, the pain is better today overall controlled. He has lost about 6-7 pounds since he started treatment, with stable since last week. He is going to see our dietitian Barbara again today. No other new complaints.  MEDICAL HISTORY:  Past Medical History  Diagnosis Date  . Esophageal cancer (HCC) 11/23/15    lower 3rd esohagus   . Hypertension   . GERD (gastroesophageal reflux disease)     SURGICAL HISTORY: Past Surgical History  Procedure Laterality Date  . Right shoulder surgery    . Prostate biopsy  10/05/15  . Cystoscope  4/12/1      prostate    SOCIAL HISTORY: Social History   Social History  . Marital Status: Married    Spouse Name: N/A  . Number of Children: N/A  . Years of Education: N/A   Occupational History  . Not on file.   Social History Main Topics  . Smoking status: Former Smoker -- 1.00 packs/day for 10 years    Quit date: 07/24/1967  .  Smokeless tobacco: Not on file  . Alcohol Use: No  . Drug Use: No  . Sexual Activity: Not Currently   Other Topics Concern  . Not on file   Social History Narrative    FAMILY HISTORY: Family History  Problem Relation Age of Onset  . Cancer Maternal Grandfather 50    gastric cancer     ALLERGIES:  has No Known Allergies.  MEDICATIONS:  Current Outpatient Prescriptions  Medication Sig Dispense Refill  . acetaminophen (TYLENOL) 500 MG tablet Take 500 mg by mouth every 6 (six) hours as needed. Reported on 01/13/2016    . atenolol (TENORMIN) 25 MG tablet Take 25 mg by mouth daily.    . HYDROcodone-acetaminophen (HYCET) 7.5-325 mg/15 ml solution Take 15 mLs by mouth every 4 (four) hours as needed for moderate pain. 473 mL 0  . omeprazole (PRILOSEC) 20 MG capsule Take 20 mg by mouth daily.    . ondansetron (ZOFRAN) 8 MG tablet Take 1 tablet (8 mg total) by mouth every 8 (eight) hours as needed for nausea. 30 tablet 3  . polyethylene glycol (MIRALAX / GLYCOLAX) packet Take 17 g by mouth daily as needed.    . prochlorperazine (COMPAZINE) 10 MG tablet Take 1 tablet (10 mg total) by mouth every 6 (six) hours as needed. 30 tablet 3  . simvastatin (ZOCOR) 20 MG tablet Take 20 mg by mouth daily at 6 PM. 1/2 tablet    . Vitamin D, Cholecalciferol, 1000 units TABS Take 1,000 Units by mouth 2 (two) times daily. D3    . sucralfate (CARAFATE) 1 g tablet Take 1 tablet (1 g total) by mouth 4 (four) times daily. (Patient not taking: Reported on 01/16/2016) 120 tablet 2   No current facility-administered medications for this visit.    REVIEW OF SYSTEMS:   Constitutional: Denies fevers, chills or abnormal night sweats, (+) 15 lbs weight loss in the past year Eyes: Denies blurriness of vision, double vision or watery eyes Ears, nose, mouth, throat, and face: Denies mucositis or sore throat Respiratory: Denies cough, dyspnea or wheezes Cardiovascular: Denies palpitation, chest discomfort or lower  extremity swelling Gastrointestinal:  Denies nausea, heartburn or change in bowel habits Skin: Denies abnormal skin rashes Lymphatics: Denies new lymphadenopathy or easy bruising Neurological:Denies numbness, tingling or new weaknesses Behavioral/Psych: Mood is stable, no new changes  All other systems were reviewed with the patient and are negative.  PHYSICAL EXAMINATION: ECOG PERFORMANCE STATUS: 1 - Symptomatic but completely ambulatory  Filed Vitals:   01/16/16 1134  BP: 140/72  Pulse: 97  Temp: 98.4 F (36.9 C)  Resp: 18   Filed Weights   01/16/16 1134  Weight: 133 lb 1.6 oz (60.374 kg)    GENERAL:alert, no distress and comfortable SKIN: skin color, texture, turgor are normal, no rashes or significant lesions EYES: normal, conjunctiva are pink and non-injected, sclera clear OROPHARYNX:no exudate, no erythema and lips, buccal mucosa, and tongue normal  NECK: supple, thyroid normal size, non-tender, without nodularity LYMPH:  no palpable lymphadenopathy in the cervical, axillary or inguinal LUNGS: clear to auscultation and percussion   with normal breathing effort HEART: regular rate & rhythm and no murmurs and no lower extremity edema ABDOMEN:abdomen soft, non-tender and normal bowel sounds Musculoskeletal:no cyanosis of digits and no clubbing  PSYCH: alert & oriented x 3 with fluent speech NEURO: no focal motor/sensory deficits  LABORATORY DATA:  I have reviewed the data as listed CBC Latest Ref Rng 01/16/2016 01/09/2016 01/02/2016  WBC 4.0 - 10.3 10e3/uL 4.7 6.2 6.1  Hemoglobin 13.0 - 17.1 g/dL 14.5 14.8 14.6  Hematocrit 38.4 - 49.9 % 42.4 42.8 42.6  Platelets 140 - 400 10e3/uL 148 164 215   CMP Latest Ref Rng 01/16/2016 01/09/2016 01/02/2016  Glucose 70 - 140 mg/dl 124 113 107  BUN 7.0 - 26.0 mg/dL 15.8 12.1 11.2  Creatinine 0.7 - 1.3 mg/dL 0.7 0.7 0.7  Sodium 136 - 145 mEq/L 141 140 139  Potassium 3.5 - 5.1 mEq/L 4.2 4.3 4.2  CO2 22 - 29 mEq/L 28 26 26  Calcium 8.4  - 10.4 mg/dL 9.4 9.3 9.3  Total Protein 6.4 - 8.3 g/dL 6.4 6.4 6.7  Total Bilirubin 0.20 - 1.20 mg/dL 0.53 0.68 0.75  Alkaline Phos 40 - 150 U/L 69 71 73  AST 5 - 34 U/L 15 15 16  ALT 0 - 55 U/L 13 12 10     Pathology report Diagnosis 11/23/2015 Mass, lower third of the esophagus, mucosal biopsy Mucinous adenocarcinoma, moderately differentiated.  HER 2 (-)   RADIOGRAPHIC STUDIES: I have personally reviewed the radiological images as listed and agreed with the findings in the report. No results found. EGD by Dr. Toledo 12/20/2015 Impression: -A 6 cm mass was found in the lower third of the esophagus. Multiple biopsies were performed. -A sliding small sized hiatal hernia otherwise normal mucosa in stomach into duodenum.  PET scan 12/05/2015 ([facility]) Impression #1 distal esophagus which she'll hypermetabolic mass consistent with known malignancy #2 hypermetabolic activity in the left anterior prostate gland, correlate for malignancy #3 metabolic density left adrenal nodule. CT attenuation suggest a benign adrenal adenoma which can be moderately hypermetabolic. However given history of malignancy, metastatic disease is not excluded. #4 left apical 2 mm pulmonary nodule, too small for evaluation by PET.  EUS at [facility] 12/14/2015: Impressions: Mass was noted from 27 cm to 35 cm from the in sisters. By EUS criteria and the lesion was T3, there was 1-2 metastatic regional lymph nodes.  ASSESSMENT & PLAN:  74-year-old male presented with dysphagia with solid food  1. Distal esophageal adenocarcinoma, uT3N1M0 -I reviewed his CT scan, PET scan, EGD, and the biopsy findings with patient and his family member in details. -The PET scan showed no definitive distant metastasis. 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 This will be followed in the future scan. -By EUS, he has T3 N1 stage III disease. We discussed the options of  neoadjuvant chemotherapy. I recommend weekly carbo taxol, he is a candidate for esophagectomy, evaluated by Dr. Votanopoulos and Dr. Gearhart. He has decided to have surgery by Dr. Gearhart. -He is tolerating radiation and weekly carbo and Taxol well, lab results reviewed with patient and his wife, we'll continue.  2. HTN -We will monitor his blood pressure closely during his chemotherapy and radiation. -We discussed chemotherapy and radiation may affect his blood pressure, especially if he is dehydrated. If his blood pressure drops, we may hold off his blood pressure pill. -I encouraged him to monitor his blood pressure to home.  3. Nausea -  He knows to take the Zofran and Compazine as needed  4. Malnutrition and weight loss -I encouraged him to take nutrition supplement. He will follow up with dietitian Barbara today  Plan -Week for carbo and Taxol today -I'll see him back in 1 week, and he will see APP Lisa in 2 weeks for the last dose chemotherapy  All questions were answered. The patient knows to call the clinic with any problems, questions or concerns. I spent 20 minutes counseling the patient face to face. The total time spent in the appointment was 25 minutes and more than 50% was on counseling.     Feng, Yan, MD 01/16/2016 12:21 PM   

## 2016-01-16 NOTE — Patient Instructions (Signed)
Carboplatin injection What is this medicine? CARBOPLATIN (KAR boe pla tin) is a chemotherapy drug. It targets fast dividing cells, like cancer cells, and causes these cells to die. This medicine is used to treat ovarian cancer and many other cancers. This medicine may be used for other purposes; ask your health care provider or pharmacist if you have questions. What should I tell my health care provider before I take this medicine? They need to know if you have any of these conditions: -blood disorders -hearing problems -kidney disease -recent or ongoing radiation therapy -an unusual or allergic reaction to carboplatin, cisplatin, other chemotherapy, other medicines, foods, dyes, or preservatives -pregnant or trying to get pregnant -breast-feeding How should I use this medicine? This drug is usually given as an infusion into a vein. It is administered in a hospital or clinic by a specially trained health care professional. Talk to your pediatrician regarding the use of this medicine in children. Special care may be needed. Overdosage: If you think you have taken too much of this medicine contact a poison control center or emergency room at once. NOTE: This medicine is only for you. Do not share this medicine with others. What if I miss a dose? It is important not to miss a dose. Call your doctor or health care professional if you are unable to keep an appointment. What may interact with this medicine? -medicines for seizures -medicines to increase blood counts like filgrastim, pegfilgrastim, sargramostim -some antibiotics like amikacin, gentamicin, neomycin, streptomycin, tobramycin -vaccines Talk to your doctor or health care professional before taking any of these medicines: -acetaminophen -aspirin -ibuprofen -ketoprofen -naproxen This list may not describe all possible interactions. Give your health care provider a list of all the medicines, herbs, non-prescription drugs, or dietary  supplements you use. Also tell them if you smoke, drink alcohol, or use illegal drugs. Some items may interact with your medicine. What should I watch for while using this medicine? Your condition will be monitored carefully while you are receiving this medicine. You will need important blood work done while you are taking this medicine. This drug may make you feel generally unwell. This is not uncommon, as chemotherapy can affect healthy cells as well as cancer cells. Report any side effects. Continue your course of treatment even though you feel ill unless your doctor tells you to stop. In some cases, you may be given additional medicines to help with side effects. Follow all directions for their use. Call your doctor or health care professional for advice if you get a fever, chills or sore throat, or other symptoms of a cold or flu. Do not treat yourself. This drug decreases your body's ability to fight infections. Try to avoid being around people who are sick. This medicine may increase your risk to bruise or bleed. Call your doctor or health care professional if you notice any unusual bleeding. Be careful brushing and flossing your teeth or using a toothpick because you may get an infection or bleed more easily. If you have any dental work done, tell your dentist you are receiving this medicine. Avoid taking products that contain aspirin, acetaminophen, ibuprofen, naproxen, or ketoprofen unless instructed by your doctor. These medicines may hide a fever. Do not become pregnant while taking this medicine. Women should inform their doctor if they wish to become pregnant or think they might be pregnant. There is a potential for serious side effects to an unborn child. Talk to your health care professional or pharmacist for more information.   Do not breast-feed an infant while taking this medicine. What side effects may I notice from receiving this medicine? Side effects that you should report to your  doctor or health care professional as soon as possible: -allergic reactions like skin rash, itching or hives, swelling of the face, lips, or tongue -signs of infection - fever or chills, cough, sore throat, pain or difficulty passing urine -signs of decreased platelets or bleeding - bruising, pinpoint red spots on the skin, black, tarry stools, nosebleeds -signs of decreased red blood cells - unusually weak or tired, fainting spells, lightheadedness -breathing problems -changes in hearing -changes in vision -chest pain -high blood pressure -low blood counts - This drug may decrease the number of white blood cells, red blood cells and platelets. You may be at increased risk for infections and bleeding. -nausea and vomiting -pain, swelling, redness or irritation at the injection site -pain, tingling, numbness in the hands or feet -problems with balance, talking, walking -trouble passing urine or change in the amount of urine Side effects that usually do not require medical attention (report to your doctor or health care professional if they continue or are bothersome): -hair loss -loss of appetite -metallic taste in the mouth or changes in taste This list may not describe all possible side effects. Call your doctor for medical advice about side effects. You may report side effects to FDA at 1-800-FDA-1088. Where should I keep my medicine? This drug is given in a hospital or clinic and will not be stored at home. NOTE: This sheet is a summary. It may not cover all possible information. If you have questions about this medicine, talk to your doctor, pharmacist, or health care provider.    2016, [employer]vier/Gold Standard. (2007-10-14 14:38:05) Paclitaxel injection What is this medicine? PACLITAXEL (PAK li TAX el) is a chemotherapy drug. It targets fast dividing cells, like cancer cells, and causes these cells to die. This medicine is used to treat ovarian cancer, breast cancer, and other  cancers. This medicine may be used for other purposes; ask your health care provider or pharmacist if you have questions. What should I tell my health care provider before I take this medicine? They need to know if you have any of these conditions: -blood disorders -irregular heartbeat -infection (especially a virus infection such as chickenpox, cold sores, or herpes) -liver disease -previous or ongoing radiation therapy -an unusual or allergic reaction to paclitaxel, alcohol, polyoxyethylated castor oil, other chemotherapy agents, other medicines, foods, dyes, or preservatives -pregnant or trying to get pregnant -breast-feeding How should I use this medicine? This drug is given as an infusion into a vein. It is administered in a hospital or clinic by a specially trained health care professional. Talk to your pediatrician regarding the use of this medicine in children. Special care may be needed. Overdosage: If you think you have taken too much of this medicine contact a poison control center or emergency room at once. NOTE: This medicine is only for you. Do not share this medicine with others. What if I miss a dose? It is important not to miss your dose. Call your doctor or health care professional if you are unable to keep an appointment. What may interact with this medicine? Do not take this medicine with any of the following medications: -disulfiram -metronidazole This medicine may also interact with the following medications: -cyclosporine -diazepam -ketoconazole -medicines to increase blood counts like filgrastim, pegfilgrastim, sargramostim -other chemotherapy drugs like cisplatin, doxorubicin, epirubicin, etoposide, teniposide, vincristine -quinidine -testosterone -  vaccines -verapamil Talk to your doctor or health care professional before taking any of these medicines: -acetaminophen -aspirin -ibuprofen -ketoprofen -naproxen This list may not describe all possible  interactions. Give your health care provider a list of all the medicines, herbs, non-prescription drugs, or dietary supplements you use. Also tell them if you smoke, drink alcohol, or use illegal drugs. Some items may interact with your medicine. What should I watch for while using this medicine? Your condition will be monitored carefully while you are receiving this medicine. You will need important blood work done while you are taking this medicine. This drug may make you feel generally unwell. This is not uncommon, as chemotherapy can affect healthy cells as well as cancer cells. Report any side effects. Continue your course of treatment even though you feel ill unless your doctor tells you to stop. This medicine can cause serious allergic reactions. To reduce your risk you will need to take other medicine(s) before treatment with this medicine. In some cases, you may be given additional medicines to help with side effects. Follow all directions for their use. Call your doctor or health care professional for advice if you get a fever, chills or sore throat, or other symptoms of a cold or flu. Do not treat yourself. This drug decreases your body's ability to fight infections. Try to avoid being around people who are sick. This medicine may increase your risk to bruise or bleed. Call your doctor or health care professional if you notice any unusual bleeding. Be careful brushing and flossing your teeth or using a toothpick because you may get an infection or bleed more easily. If you have any dental work done, tell your dentist you are receiving this medicine. Avoid taking products that contain aspirin, acetaminophen, ibuprofen, naproxen, or ketoprofen unless instructed by your doctor. These medicines may hide a fever. Do not become pregnant while taking this medicine. Women should inform their doctor if they wish to become pregnant or think they might be pregnant. There is a potential for serious side  effects to an unborn child. Talk to your health care professional or pharmacist for more information. Do not breast-feed an infant while taking this medicine. Men are advised not to father a child while receiving this medicine. This product may contain alcohol. Ask your pharmacist or healthcare provider if this medicine contains alcohol. Be sure to tell all healthcare providers you are taking this medicine. Certain medicines, like metronidazole and disulfiram, can cause an unpleasant reaction when taken with alcohol. The reaction includes flushing, headache, nausea, vomiting, sweating, and increased thirst. The reaction can last from 30 minutes to several hours. What side effects may I notice from receiving this medicine? Side effects that you should report to your doctor or health care professional as soon as possible: -allergic reactions like skin rash, itching or hives, swelling of the face, lips, or tongue -low blood counts - This drug may decrease the number of white blood cells, red blood cells and platelets. You may be at increased risk for infections and bleeding. -signs of infection - fever or chills, cough, sore throat, pain or difficulty passing urine -signs of decreased platelets or bleeding - bruising, pinpoint red spots on the skin, black, tarry stools, nosebleeds -signs of decreased red blood cells - unusually weak or tired, fainting spells, lightheadedness -breathing problems -chest pain -high or low blood pressure -mouth sores -nausea and vomiting -pain, swelling, redness or irritation at the injection site -pain, tingling, numbness in the hands or   feet -slow or irregular heartbeat -swelling of the ankle, feet, hands Side effects that usually do not require medical attention (report to your doctor or health care professional if they continue or are bothersome): -bone pain -complete hair loss including hair on your head, underarms, pubic hair, eyebrows, and eyelashes -changes in  the color of fingernails -diarrhea -loosening of the fingernails -loss of appetite -muscle or joint pain -red flush to skin -sweating This list may not describe all possible side effects. Call your doctor for medical advice about side effects. You may report side effects to FDA at 1-800-FDA-1088. Where should I keep my medicine? This drug is given in a hospital or clinic and will not be stored at home. NOTE: This sheet is a summary. It may not cover all possible information. If you have questions about this medicine, talk to your doctor, pharmacist, or health care provider.    2016, [employer]vier/Gold Standard. (2015-02-24 13:02:56)  

## 2016-01-17 ENCOUNTER — Telehealth: Payer: Self-pay | Admitting: Hematology

## 2016-01-17 ENCOUNTER — Ambulatory Visit
Admission: RE | Admit: 2016-01-17 | Discharge: 2016-01-17 | Disposition: A | Discharge status: Home / Self Care | Payer: No Typology Code available for payment source | Source: Ambulatory Visit | Attending: Radiation Oncology | Admitting: Radiation Oncology

## 2016-01-17 ENCOUNTER — Ambulatory Visit: Payer: No Typology Code available for payment source | Admitting: Hematology

## 2016-01-17 DIAGNOSIS — Z510 Encounter for antineoplastic radiation therapy: Secondary | ICD-10-CM | POA: Diagnosis not present

## 2016-01-17 NOTE — Telephone Encounter (Signed)
per pof to sch pt appt-cld & spke to pt and adv to come to sch for updated copy -adv sch changed 7/23 & 7/10-pt stated will come by

## 2016-01-18 ENCOUNTER — Ambulatory Visit
Admission: RE | Admit: 2016-01-18 | Discharge: 2016-01-18 | Disposition: A | Discharge status: Home / Self Care | Payer: No Typology Code available for payment source | Source: Ambulatory Visit | Attending: Radiation Oncology | Admitting: Radiation Oncology

## 2016-01-18 DIAGNOSIS — Z510 Encounter for antineoplastic radiation therapy: Secondary | ICD-10-CM | POA: Diagnosis not present

## 2016-01-19 ENCOUNTER — Encounter: Payer: Self-pay | Admitting: Radiation Oncology

## 2016-01-19 ENCOUNTER — Ambulatory Visit
Admission: RE | Admit: 2016-01-19 | Discharge: 2016-01-19 | Disposition: A | Discharge status: Home / Self Care | Payer: No Typology Code available for payment source | Source: Ambulatory Visit | Attending: Radiation Oncology | Admitting: Radiation Oncology

## 2016-01-19 DIAGNOSIS — Z510 Encounter for antineoplastic radiation therapy: Secondary | ICD-10-CM | POA: Diagnosis not present

## 2016-01-20 ENCOUNTER — Ambulatory Visit
Admission: RE | Admit: 2016-01-20 | Discharge: 2016-01-20 | Disposition: A | Discharge status: Home / Self Care | Payer: No Typology Code available for payment source | Source: Ambulatory Visit | Attending: Radiation Oncology | Admitting: Radiation Oncology

## 2016-01-20 ENCOUNTER — Encounter: Payer: Self-pay | Admitting: Radiation Oncology

## 2016-01-20 ENCOUNTER — Ambulatory Visit: Payer: No Typology Code available for payment source | Admitting: Radiation Oncology

## 2016-01-20 VITALS — BP 116/94 | HR 97 | Temp 98.2°F | Resp 20 | Wt 130.2 lb

## 2016-01-20 DIAGNOSIS — C155 Malignant neoplasm of lower third of esophagus: Secondary | ICD-10-CM

## 2016-01-20 DIAGNOSIS — Z510 Encounter for antineoplastic radiation therapy: Secondary | ICD-10-CM | POA: Diagnosis not present

## 2016-01-20 MED ORDER — RADIAPLEXRX EX GEL
Freq: Once | CUTANEOUS | Status: AC
  Administered 2016-01-20: 11:00:00 via TOPICAL

## 2016-01-20 MED ORDER — FLUCONAZOLE 100 MG PO TABS: ORAL_TABLET | ORAL | Status: DC

## 2016-01-20 NOTE — Progress Notes (Signed)
The patient was seen during his under treatment visit and although his esophageal pain has improved with the lortab elixir, he has noticed bad breath and plaques on his tongue. After his candida on the tongue was confirmed on exam, we discussed the use of Diflucan. I've e-scribed Diflucan 100mg  #16, two tabs po on day 1 and one tab po daily for 14 days to his pharmacy. He will keep [place] informed of any concerns prior to his next visit. He will also hold his statin therapy during this administration due to interactions.     Carola Rhine, PAC

## 2016-01-20 NOTE — Progress Notes (Signed)
Weekly rad txs esophagus/ 20/28 completed, still has burning in throat taking hycet  Elixir  Prn which helps, eating softer foods, and drinks ensure 3-4x day, trouble sleeping, chest area mild erythema, gave radiaplex to use bid,  Poor energy ,not sleeping well, request rx 10:27 AM BP 116/94 mmHg  Pulse 97  Temp(Src) 98.2 F (36.8 C) (Oral)  Resp 20  Wt 130 lb 3.2 oz (59.058 kg)  Wt Readings from Last 3 Encounters:  01/20/16 130 lb 3.2 oz (59.058 kg)  01/16/16 133 lb 1.6 oz (60.374 kg)  01/09/16 134 lb (60.782 kg)

## 2016-01-20 NOTE — Addendum Note (Signed)
Encounter addended by: Doreen Beam, RN on: 01/20/2016 11:01 AM<BR>     Documentation filed: Inpatient MAR

## 2016-01-20 NOTE — Addendum Note (Signed)
Encounter addended by: Kyung Rudd, MD on: 01/20/2016 11:26 AM<BR>     Documentation filed: Notes Section, Follow-up Section, LOS Section

## 2016-01-20 NOTE — Progress Notes (Signed)
Department of Radiation Oncology  Phone:  [phone] Fax:        [phone]  Weekly Treatment Note    Name: Fred [name] Date: 01/20/2016 MRN: [id number] DOB: 12/13/1940   Diagnosis:     ICD-9-CM ICD-10-CM   1. 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 acetaminophen (TYLENOL) 500 MG tablet Take 500 mg by mouth every 6 (six) hours as needed. Reported on 01/13/2016    . atenolol (TENORMIN) 25 MG tablet Take 25 mg by mouth daily.    . hyaluronate sodium (RADIAPLEXRX) GEL Apply 1 application topically 2 (two) times daily.    Marland Kitchen HYDROcodone-acetaminophen (HYCET) 7.5-325 mg/15 ml solution Take 15 mLs by mouth every 4 (four) hours as needed for moderate pain. 473 mL 0  . omeprazole (PRILOSEC) 20 MG capsule Take 20 mg by mouth daily.    . ondansetron (ZOFRAN) 8 MG tablet Take 1 tablet (8 mg total) by mouth every 8 (eight) hours as needed for nausea. 30 tablet 3  . polyethylene glycol (MIRALAX / GLYCOLAX) packet Take 17 g by mouth daily as needed.    . prochlorperazine (COMPAZINE) 10 MG tablet Take 1 tablet (10 mg total) by mouth every 6 (six) hours as needed. 30 tablet 3  . simvastatin (ZOCOR) 20 MG tablet Take 20 mg by mouth daily at 6 PM. 1/2 tablet    . Vitamin D, Cholecalciferol, 1000 units TABS Take 1,000 Units by mouth 2 (two) times daily. D3    . fluconazole (DIFLUCAN) 100 MG tablet Take two tabs PO on first day, then one tab po daily for 14 more days 16 tablet 0  . sucralfate (CARAFATE) 1 g tablet Take 1 tablet (1 g total) by mouth 4 (four) times daily. (Patient not taking: Reported on 01/16/2016) 120 tablet 2   No current facility-administered medications for this encounter.     ALLERGIES: Review of patient's allergies indicates no known allergies.   LABORATORY DATA:  Lab Results    Component Value Date   WBC 4.7 01/16/2016   HGB 14.5 01/16/2016   HCT 42.4 01/16/2016   MCV 89.2 01/16/2016   PLT 148 01/16/2016   Lab Results  Component Value Date   NA 141 01/16/2016   K 4.2 01/16/2016   CO2 28 01/16/2016   Lab Results  Component Value Date   ALT 13 01/16/2016   AST 15 01/16/2016   ALKPHOS 69 01/16/2016   BILITOT 0.53 01/16/2016     NARRATIVE: Fred [name] was seen today for weekly treatment management. The chart was checked and the patient's films were reviewed.  Weekly rad txs esophagus/ 20/28 completed, still has burning in throat taking hycet  Elixir  Prn which helps, eating softer foods, and drinks ensure 3-4x day, trouble sleeping, chest area mild erythema, gave radiaplex to use bid,  Poor energy ,not sleeping well, request rx 11:25 AM BP 116/94 mmHg  Pulse 97  Temp(Src) 98.2 F (36.8 C) (Oral)  Resp 20  Wt 130 lb 3.2 oz (59.058 kg)  Wt Readings from Last 3 Encounters:  01/20/16 130 lb 3.2 oz (59.058 kg)  01/16/16 133 lb 1.6 oz (60.374 kg)  01/09/16 134 lb (60.782 kg)    PHYSICAL EXAMINATION: weight is 130 lb 3.2 oz (59.058 kg). His oral temperature is 98.2 F (36.8 C). His blood pressure  is 116/94 and his pulse is 97. His respiration is 20.        ASSESSMENT: The patient is doing satisfactorily with treatment.  PLAN: We will continue with the patient's radiation treatment as planned. The patient has been given a prescription for Diflucan and he will continue his current pain medication for ongoing esophagitis.

## 2016-01-23 ENCOUNTER — Ambulatory Visit: Payer: No Typology Code available for payment source | Admitting: Nutrition

## 2016-01-23 ENCOUNTER — Other Ambulatory Visit: Payer: No Typology Code available for payment source

## 2016-01-23 ENCOUNTER — Ambulatory Visit (HOSPITAL_BASED_OUTPATIENT_CLINIC_OR_DEPARTMENT_OTHER): Payer: No Typology Code available for payment source | Admitting: Hematology

## 2016-01-23 ENCOUNTER — Other Ambulatory Visit: Payer: Self-pay | Admitting: Radiation Oncology

## 2016-01-23 ENCOUNTER — Ambulatory Visit (HOSPITAL_BASED_OUTPATIENT_CLINIC_OR_DEPARTMENT_OTHER): Payer: No Typology Code available for payment source

## 2016-01-23 ENCOUNTER — Encounter: Payer: Self-pay | Admitting: Hematology

## 2016-01-23 ENCOUNTER — Telehealth: Payer: Self-pay | Admitting: Hematology

## 2016-01-23 ENCOUNTER — Ambulatory Visit
Admission: RE | Admit: 2016-01-23 | Discharge: 2016-01-23 | Disposition: A | Discharge status: Home / Self Care | Payer: No Typology Code available for payment source | Source: Ambulatory Visit | Attending: Radiation Oncology | Admitting: Radiation Oncology

## 2016-01-23 ENCOUNTER — Telehealth: Payer: Self-pay | Admitting: *Deleted

## 2016-01-23 ENCOUNTER — Other Ambulatory Visit (HOSPITAL_BASED_OUTPATIENT_CLINIC_OR_DEPARTMENT_OTHER): Payer: No Typology Code available for payment source

## 2016-01-23 VITALS — BP 111/57 | HR 79 | Temp 98.5°F | Resp 18 | Ht 65.0 in | Wt 127.4 lb

## 2016-01-23 DIAGNOSIS — R634 Abnormal weight loss: Secondary | ICD-10-CM | POA: Diagnosis not present

## 2016-01-23 DIAGNOSIS — Z5111 Encounter for antineoplastic chemotherapy: Secondary | ICD-10-CM | POA: Diagnosis not present

## 2016-01-23 DIAGNOSIS — R1310 Dysphagia, unspecified: Secondary | ICD-10-CM | POA: Diagnosis not present

## 2016-01-23 DIAGNOSIS — Z510 Encounter for antineoplastic radiation therapy: Secondary | ICD-10-CM | POA: Diagnosis not present

## 2016-01-23 DIAGNOSIS — R3989 Other symptoms and signs involving the genitourinary system: Secondary | ICD-10-CM

## 2016-01-23 DIAGNOSIS — I10 Essential (primary) hypertension: Secondary | ICD-10-CM | POA: Insufficient documentation

## 2016-01-23 DIAGNOSIS — C155 Malignant neoplasm of lower third of esophagus: Secondary | ICD-10-CM

## 2016-01-23 LAB — COMPREHENSIVE METABOLIC PANEL
ALT: 15 U/L (ref 0–55)
ANION GAP: 10 meq/L (ref 3–11)
AST: 16 U/L (ref 5–34)
Albumin: 3.4 g/dL — ABNORMAL LOW (ref 3.5–5.0)
Alkaline Phosphatase: 83 U/L (ref 40–150)
BILIRUBIN TOTAL: 0.55 mg/dL (ref 0.20–1.20)
BUN: 14.3 mg/dL (ref 7.0–26.0)
CO2: 25 meq/L (ref 22–29)
CREATININE: 0.8 mg/dL (ref 0.7–1.3)
Calcium: 9.3 mg/dL (ref 8.4–10.4)
Chloride: 104 mEq/L (ref 98–109)
EGFR: 89 mL/min/{1.73_m2} — ABNORMAL LOW (ref >90)
Glucose: 136 mg/dl (ref 70–140)
Potassium: 4.1 mEq/L (ref 3.5–5.1)
Sodium: 139 mEq/L (ref 136–145)
TOTAL PROTEIN: 6.3 g/dL — AB (ref 6.4–8.3)

## 2016-01-23 LAB — CBC WITH DIFFERENTIAL/PLATELET
BASO%: 1.1 % (ref 0.0–2.0)
Basophils Absolute: 0.1 10*3/uL (ref 0.0–0.1)
EOS%: 0.5 % (ref 0.0–7.0)
Eosinophils Absolute: 0 10*3/uL (ref 0.0–0.5)
HEMATOCRIT: 41.5 % (ref 38.4–49.9)
HGB: 14.6 g/dL (ref 13.0–17.1)
LYMPH#: 0.2 10*3/uL — AB (ref 0.9–3.3)
LYMPH%: 3.2 % — AB (ref 14.0–49.0)
MCH: 31.2 pg (ref 27.2–33.4)
MCHC: 35.3 g/dL (ref 32.0–36.0)
MCV: 88.5 fL (ref 79.3–98.0)
MONO#: 0.6 10*3/uL (ref 0.1–0.9)
MONO%: 11.9 % (ref 0.0–14.0)
NEUT%: 83.3 % — ABNORMAL HIGH (ref 39.0–75.0)
NEUTROS ABS: 4 10*3/uL (ref 1.5–6.5)
PLATELETS: 144 10*3/uL (ref 140–400)
RBC: 4.69 10*6/uL (ref 4.20–5.82)
RDW: 13.9 % (ref 11.0–14.6)
WBC: 4.8 10*3/uL (ref 4.0–10.3)

## 2016-01-23 MED ORDER — NYSTATIN 100000 UNIT/ML MT SUSP: 5.0000 mL | Freq: Four times a day (QID) | OROMUCOSAL | Status: DC

## 2016-01-23 MED ORDER — SODIUM CHLORIDE 0.9 % IV SOLN
20.0000 mg | Freq: Once | INTRAVENOUS | Status: AC
  Administered 2016-01-23: 20 mg via INTRAVENOUS
  Filled 2016-01-23: qty 2

## 2016-01-23 MED ORDER — DIPHENHYDRAMINE HCL 50 MG/ML IJ SOLN
50.0000 mg | Freq: Once | INTRAMUSCULAR | Status: AC
  Administered 2016-01-23: 50 mg via INTRAVENOUS

## 2016-01-23 MED ORDER — FAMOTIDINE IN NACL 20-0.9 MG/50ML-% IV SOLN
INTRAVENOUS | Status: AC
  Filled 2016-01-23: qty 50

## 2016-01-23 MED ORDER — FAMOTIDINE IN NACL 20-0.9 MG/50ML-% IV SOLN
20.0000 mg | Freq: Once | INTRAVENOUS | Status: AC
  Administered 2016-01-23: 20 mg via INTRAVENOUS

## 2016-01-23 MED ORDER — PALONOSETRON HCL INJECTION 0.25 MG/5ML
INTRAVENOUS | Status: AC
  Filled 2016-01-23: qty 5

## 2016-01-23 MED ORDER — CARBOPLATIN CHEMO INJECTION 450 MG/45ML
164.0000 mg | Freq: Once | INTRAVENOUS | Status: AC
  Administered 2016-01-23: 160 mg via INTRAVENOUS
  Filled 2016-01-23: qty 16

## 2016-01-23 MED ORDER — SUCRALFATE 1 GM/10ML PO SUSP: 1.0000 g | Freq: Three times a day (TID) | ORAL | Status: DC

## 2016-01-23 MED ORDER — PALONOSETRON HCL INJECTION 0.25 MG/5ML
0.2500 mg | Freq: Once | INTRAVENOUS | Status: AC
  Administered 2016-01-23: 0.25 mg via INTRAVENOUS

## 2016-01-23 MED ORDER — SODIUM CHLORIDE 0.9 % IV SOLN
Freq: Once | INTRAVENOUS | Status: AC
  Administered 2016-01-23: 12:00:00 via INTRAVENOUS

## 2016-01-23 MED ORDER — DIPHENHYDRAMINE HCL 50 MG/ML IJ SOLN
INTRAMUSCULAR | Status: AC
  Filled 2016-01-23: qty 1

## 2016-01-23 MED ORDER — PACLITAXEL CHEMO INJECTION 300 MG/50ML
45.0000 mg/m2 | Freq: Once | INTRAVENOUS | Status: AC
  Administered 2016-01-23: 78 mg via INTRAVENOUS
  Filled 2016-01-23: qty 13

## 2016-01-23 NOTE — Telephone Encounter (Signed)
Thu Bray,RN called ,patient upstairs and asking to have rx carafate changed to liquid, will  Ask our PA-C Shona Simpson  To sned new rx to his pharmacy, rx e-scripted to [place],  12:51 PM

## 2016-01-23 NOTE — Telephone Encounter (Signed)
left msg confirming 7/18 nut f/u per Ernestene Kiel

## 2016-01-23 NOTE — Progress Notes (Signed)
[place]  [phone] [phone]) [phone]  Clinic Follow up Note   Patient Care Team: [name], MD as PCP - General (Internal Medicine) Truitt Merle, MD as Consulting Physician (Hematology) Kyung Rudd, MD as Consulting Physician (Radiation Oncology) [name], MD as Consulting Physician (Cardiothoracic Surgery)   CHIEF COMPLAINTS:  Follow up esophageal adenocarcinoma  Oncology History   Presented with solid dysphagia at least daily with chest pain. Involuntary weight loss of 10-12 lb over past year  Malignant neoplasm of lower third of esophagus (HCC)   Staging form: Esophagus - Adenocarcinoma, AJCC 7th Edition     Clinical stage from 12/15/2015: Stage IIIA (T3, N1, M0) - Signed by Truitt Merle, MD on 12/24/2015       Malignant neoplasm of lower third of esophagus ([place])   11/23/2015 Procedure EGD per [facility](Dr. [place]):6cm mass in lower third of esophagus   11/24/2015 Initial Diagnosis Esophageal cancer ([place])   11/24/2015 Pathology Results Mucinous adenocarcinoma, moderately differentiated-Her2 analysis pending   12/05/2015 Imaging PET scan showed hypermetabolic a mass in distal esophagus, hypermetabolic activity in the left anterior prostate gland, metabolic density in left adrenal nodule, CT attenuation suggest a benign adrenal adenoma. Left apical 2 mm lung nodule.   12/15/2015 Procedure EUS showed a uT3N1 lesion from 27cm to 35cm from the incisors    12/26/2015 -  Chemotherapy Weekly carboplatin AUC 2 and Taxol 45 mg/m, with concurrent radiation   12/26/2015 -  Radiation Therapy Neoadjuvant chemoradiation to the esophageal cancer    HISTORY OF PRESENTING ILLNESS (12/14/2015):  Fred [name] 75 y.o. male is here because of His newly diagnosed esophageal cancer. He is accompanied by his wife and daughter to my clinic today.  He has had dysphagia for 2 months, mainly with solid food, no dysphagia with soft food or liquid. He also  has mild pain in mid chest when he swallows. No nausea, abdominal pain, or other discomfort. He has lost about 15 lbs in the last year, no other symoptoms. He was seen by his primary care physician at [facility], and referred to gastroenterologist Dr. Alice Reichert. He underwent EGD on 11/23/2015, which showed a 6 cm mass in the lower third of esophagus. Biopsy showed adenocarcinoma, moderately differentiated. He was referred to [place] to consider neoadjuvant therapy. He is scheduled to see a Psychologist, sport and exercise at [facility] later this week.  He has had some urinary frequency, underwent TURP on 11/02/2015.   CURRENT THERAPY: pending concurrent chemoRT with Weekly carboplatin AUC 2 and Taxol 66m/m2, started on 12/26/2015   INTERIM HISTORY: Fred [name] for follow up. He is on week 5 chemo and RT. He is doing moderately well, still has mild to moderate chest pain, especially when he swallows, he takes hydrocordone a few times a day. He also drinks boost 2 bottle a day. He has lost about 10 lbs since he started treatment.   MEDICAL HISTORY:  Past Medical History  Diagnosis Date  . Esophageal cancer ([facility] 11/23/15    lower 3rd esohagus   . Hypertension   . GERD (gastroesophageal reflux disease)     SURGICAL HISTORY: Past Surgical History  Procedure Laterality Date  . Right shoulder surgery    . Prostate biopsy  10/05/15  . Cystoscope  4/12/1    prostate    SOCIAL HISTORY: Social History   Social History  . Marital Status: Married    Spouse Name: N/A  . Number of Children: N/A  . Years of Education: N/A  Occupational History  . Not on file.   Social History Main Topics  . Smoking status: Former Smoker -- 1.00 packs/day for 10 years    Quit date: 07/24/1967  . Smokeless tobacco: Not on file  . Alcohol Use: No  . Drug Use: No  . Sexual Activity: Not Currently   Other Topics Concern  . Not on file   Social History Narrative    FAMILY HISTORY: Family History  Problem Relation Age of Onset    . Cancer Maternal Grandfather 73    gastric cancer     ALLERGIES:  has No Known Allergies.  MEDICATIONS:  Current Outpatient Prescriptions  Medication Sig Dispense Refill  . acetaminophen (TYLENOL) 500 MG tablet Take 500 mg by mouth every 6 (six) hours as needed. Reported on 01/13/2016    . atenolol (TENORMIN) 25 MG tablet Take 25 mg by mouth daily.    . fluconazole (DIFLUCAN) 100 MG tablet Take two tabs PO on first day, then one tab po daily for 14 more days 16 tablet 0  . hyaluronate sodium (RADIAPLEXRX) GEL Apply 1 application topically 2 (two) times daily.    Marland Kitchen HYDROcodone-acetaminophen (HYCET) 7.5-325 mg/15 ml solution Take 15 mLs by mouth every 4 (four) hours as needed for moderate pain. 473 mL 0  . omeprazole (PRILOSEC) 20 MG capsule Take 20 mg by mouth daily.    . ondansetron (ZOFRAN) 8 MG tablet Take 1 tablet (8 mg total) by mouth every 8 (eight) hours as needed for nausea. 30 tablet 3  . polyethylene glycol (MIRALAX / GLYCOLAX) packet Take 17 g by mouth daily as needed.    . prochlorperazine (COMPAZINE) 10 MG tablet Take 1 tablet (10 mg total) by mouth every 6 (six) hours as needed. 30 tablet 3  . simvastatin (ZOCOR) 20 MG tablet Take 20 mg by mouth daily at 6 PM. 1/2 tablet    . sucralfate (CARAFATE) 1 g tablet Take 1 tablet (1 g total) by mouth 4 (four) times daily. (Patient not taking: Reported on 01/16/2016) 120 tablet 2  . Vitamin D, Cholecalciferol, 1000 units TABS Take 1,000 Units by mouth 2 (two) times daily. D3     No current facility-administered medications for this visit.    REVIEW OF SYSTEMS:   Constitutional: Denies fevers, chills or abnormal night sweats, (+) 15 lbs weight loss in the past year Eyes: Denies blurriness of vision, double vision or watery eyes Ears, nose, mouth, throat, and face: Denies mucositis or sore throat Respiratory: Denies cough, dyspnea or wheezes Cardiovascular: Denies palpitation, chest discomfort or lower extremity  swelling Gastrointestinal:  Denies nausea, heartburn or change in bowel habits Skin: Denies abnormal skin rashes Lymphatics: Denies new lymphadenopathy or easy bruising Neurological:Denies numbness, tingling or new weaknesses Behavioral/Psych: Mood is stable, no new changes  All other systems were reviewed with the patient and are negative.  PHYSICAL EXAMINATION: ECOG PERFORMANCE STATUS: 1 - Symptomatic but completely ambulatory  Filed Vitals:   01/23/16 1008  BP: 111/57  Pulse: 79  Temp: 98.5 F (36.9 C)  Resp: 18   There were no vitals filed for this visit.  GENERAL:alert, no distress and comfortable SKIN: skin color, texture, turgor are normal, no rashes or significant lesions EYES: normal, conjunctiva are pink and non-injected, sclera clear OROPHARYNX:no exudate, no erythema and lips, buccal mucosa, and tongue normal  NECK: supple, thyroid normal size, non-tender, without nodularity LYMPH:  no palpable lymphadenopathy in the cervical, axillary or inguinal LUNGS: clear to auscultation and percussion with normal  breathing effort HEART: regular rate & rhythm and no murmurs and no lower extremity edema ABDOMEN:abdomen soft, non-tender and normal bowel sounds Musculoskeletal:no cyanosis of digits and no clubbing  PSYCH: alert & oriented x 3 with fluent speech NEURO: no focal motor/sensory deficits  LABORATORY DATA:  I have reviewed the data as listed CBC Latest Ref Rng 01/16/2016 01/09/2016 01/02/2016  WBC 4.0 - 10.3 10e3/uL 4.7 6.2 6.1  Hemoglobin 13.0 - 17.1 g/dL 14.5 14.8 14.6  Hematocrit 38.4 - 49.9 % 42.4 42.8 42.6  Platelets 140 - 400 10e3/uL 148 164 215   CMP Latest Ref Rng 01/16/2016 01/09/2016 01/02/2016  Glucose 70 - 140 mg/dl 124 113 107  BUN 7.0 - 26.0 mg/dL 15.8 12.1 11.2  Creatinine 0.7 - 1.3 mg/dL 0.7 0.7 0.7  Sodium 136 - 145 mEq/L 141 140 139  Potassium 3.5 - 5.1 mEq/L 4.2 4.3 4.2  CO2 22 - 29 mEq/L 28 26 26   Calcium 8.4 - 10.4 mg/dL 9.4 9.3 9.3  Total  Protein 6.4 - 8.3 g/dL 6.4 6.4 6.7  Total Bilirubin 0.20 - 1.20 mg/dL 0.53 0.68 0.75  Alkaline Phos 40 - 150 U/L 69 71 73  AST 5 - 34 U/L 15 15 16   ALT 0 - 55 U/L 13 12 10      Pathology report Diagnosis 11/23/2015 Mass, lower third of the esophagus, mucosal biopsy Mucinous adenocarcinoma, moderately differentiated.  HER 2 (-)   RADIOGRAPHIC STUDIES: I have personally reviewed the radiological images as listed and agreed with the findings in the report. No results found. EGD by Dr. Alice Reichert 12/20/2015 Impression: -A 6 cm mass was found in the lower third of the esophagus. Multiple biopsies were performed. -A sliding small sized hiatal hernia otherwise normal mucosa in stomach into duodenum.  PET scan 12/05/2015 ([facility]) Impression #1 distal esophagus which she'll hypermetabolic mass consistent with known malignancy #2 hypermetabolic activity in the left anterior prostate gland, correlate for malignancy #3 metabolic density left adrenal nodule. CT attenuation suggest a benign adrenal adenoma which can be moderately hypermetabolic. However given history of malignancy, metastatic disease is not excluded. #4 left apical 2 mm pulmonary nodule, too small for evaluation by PET.  EUS at [facility] 12/14/2015: Impressions: Mass was noted from 27 cm to 35 cm from the in sisters. By EUS criteria and the lesion was T3, there was 1-2 metastatic regional lymph nodes.  ASSESSMENT & PLAN:  75 year old male presented with dysphagia with solid food  1. Distal esophageal adenocarcinoma, [id number] -I reviewed his CT scan, PET scan, EGD, and the biopsy findings with patient and his family member in details. -The PET scan showed no definitive distant metastasis. 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 This will be followed in the future scan. -By EUS, he has T3 N1 stage III disease. We discussed the options of neoadjuvant chemotherapy. I  recommend weekly carbo taxol, he is a candidate for esophagectomy, evaluated by Dr. Johney Maine and Dr. Pia Mau. He has decided to have surgery by Dr. Pia Mau. -He is tolerating radiation and weekly carbo and Taxol well, with some expected side effects, manageable, lab results reviewed with patient and his wife, we'll continue. -He will complete treatment next week. He is scheduled to see Dr. Pia Mau back on 7/13, but is still waiting [facility] to [facility].   2. HTN -BP has been normal, will cotninue monitoring   3. Odynophagia and Nausea -He knows to take the Zofran and Compazine as needed  for nausea -His odynophagia and chest pain overall controlled, he takes hydrocodone  4. Malnutrition and weight loss -He has lost an additional 10 pounds since he started chemotherapy and radiation -I encouraged him to take nutrition supplement. He will follow up with dietitian Pamala Hurry today  Plan -Continue chemotherapy and radiation -I'll see him back in 1 week,  he is scheduled to complete radiation on July 13  All questions were answered. The patient knows to call the clinic with any problems, questions or concerns. I spent 15 minutes counseling the patient face to face. The total time spent in the appointment was 20 minutes and more than 50% was on counseling.     Truitt Merle, MD 01/23/2016

## 2016-01-23 NOTE — Progress Notes (Signed)
Nutrition follow-up completed with patient during infusion for esophageal cancer.  Weight decreased and documented as 127.4 pounds July 3 decreased from 140 pounds June 9. This is a 9% decrease over 4 weeks. Patient does not have a feeding tube. Patient continues to report increased pain and difficulty swallowing. He has nausea but no vomiting.  Patient reports nausea is result of increased phlegm. Patient denies diarrhea or constipation. Noted albumin of 3.4 on July 3.  Patient meets criteria for severe protein calorie malnutrition in the context of chronic illness secondary to 9% weight loss in one month and depletion of both muscle and body fat  Nutrition diagnosis: Unintended weight loss continues.  Estimated nutrition needs: 1820-2135 calories, 75-90 grams protein, greater than 2 L fluid daily.  Intervention:  Again reviewed importance of increased calories and protein to minimize further weight loss. Assisted patient with modifying Ensure Plus to be thinner consistency so that he could increase intake to a minimum of 4 cans daily. Patient is not eating 2 hours prior to radiation therapy secondary to M.D. order. Encouraged patient to take medications as prescribed for adequate pain relief to increase oral intake. Questions were answered and teach back method was used.  Monitoring, evaluation, goals: Patient will tolerate increased calories and protein to minimize further weight loss.  He will work to increase Ensure Plus 4 times a day.  Next visit: Monday, July 10, during infusion.  I have asked the scheduler to please schedule follow-up for the week of July 17.  **Disclaimer: This note was dictated with voice recognition software. Similar sounding words can inadvertently be transcribed and this note may contain transcription errors which may not have been corrected upon publication of note.**

## 2016-01-23 NOTE — Patient Instructions (Addendum)
[facility] Discharge Instructions for Patients Receiving Chemotherapy  Today you received the following chemotherapy agents Taxol/Carboplatin  To help prevent nausea and vomiting after your treatment, we encourage you to take your nausea medication    If you develop nausea and vomiting that is not controlled by your nausea medication, call the clinic.   BELOW ARE SYMPTOMS THAT SHOULD BE REPORTED IMMEDIATELY:  *FEVER GREATER THAN 100.5 F  *CHILLS WITH OR WITHOUT FEVER  NAUSEA AND VOMITING THAT IS NOT CONTROLLED WITH YOUR NAUSEA MEDICATION  *UNUSUAL SHORTNESS OF BREATH  *UNUSUAL BRUISING OR BLEEDING  TENDERNESS IN MOUTH AND THROAT WITH OR WITHOUT PRESENCE OF ULCERS  *URINARY PROBLEMS  *BOWEL PROBLEMS  UNUSUAL RASH Items with * indicate a potential emergency and should be followed up as soon as possible.  Feel free to call the clinic you have any questions or concerns. The clinic phone number is [phone].  Please show the CHEMO ALERT CARD at check-in to the Emergency Department and triage nurse.   

## 2016-01-24 LAB — PSA: PROSTATE SPECIFIC AG, SERUM: 3 ng/mL (ref 0.0–4.0)

## 2016-01-25 ENCOUNTER — Ambulatory Visit
Admission: RE | Admit: 2016-01-25 | Discharge: 2016-01-25 | Disposition: A | Discharge status: Home / Self Care | Payer: No Typology Code available for payment source | Source: Ambulatory Visit | Attending: Radiation Oncology | Admitting: Radiation Oncology

## 2016-01-25 ENCOUNTER — Telehealth: Payer: Self-pay | Admitting: *Deleted

## 2016-01-25 DIAGNOSIS — Z510 Encounter for antineoplastic radiation therapy: Secondary | ICD-10-CM | POA: Diagnosis not present

## 2016-01-25 NOTE — Telephone Encounter (Signed)
Called [facility] [phone] asking for help with rejected rx carafate liquid, couldn't access cover my meds  By myself or Shona Simpson,. PA,left voice message to call us  2:13 PM

## 2016-01-26 ENCOUNTER — Ambulatory Visit
Admission: RE | Admit: 2016-01-26 | Discharge: 2016-01-26 | Disposition: A | Discharge status: Home / Self Care | Payer: No Typology Code available for payment source | Source: Ambulatory Visit | Attending: Radiation Oncology | Admitting: Radiation Oncology

## 2016-01-26 ENCOUNTER — Telehealth: Payer: Self-pay | Admitting: *Deleted

## 2016-01-26 DIAGNOSIS — Z510 Encounter for antineoplastic radiation therapy: Secondary | ICD-10-CM | POA: Diagnosis not present

## 2016-01-26 NOTE — Telephone Encounter (Addendum)
[employer] and spoke with the pharmacist Julien Nordmann, they do not carry sucralfate at [facility] ,however they can carry carafate which is for a 10 day supply liquid=$247.50   Still need to get prior authorization for the carafate liquid; Fred [name]  And will call the patient  And ask if he still wants the liquid 12:50 PM Called the patient,  He stated he would just keep the tablets of carafate  anot will not get the carafate suspension,  And start taking them 4x day he is doing better with the pain elixir, thanked me for checking on him and the prescription 12:57 PM

## 2016-01-27 ENCOUNTER — Encounter: Payer: Self-pay | Admitting: Radiation Oncology

## 2016-01-27 ENCOUNTER — Ambulatory Visit
Admission: RE | Admit: 2016-01-27 | Discharge: 2016-01-27 | Disposition: A | Discharge status: Home / Self Care | Payer: No Typology Code available for payment source | Source: Ambulatory Visit | Attending: Radiation Oncology | Admitting: Radiation Oncology

## 2016-01-27 VITALS — BP 105/66 | HR 69 | Temp 98.1°F | Resp 20 | Wt 125.9 lb

## 2016-01-27 DIAGNOSIS — C155 Malignant neoplasm of lower third of esophagus: Secondary | ICD-10-CM

## 2016-01-27 DIAGNOSIS — Z510 Encounter for antineoplastic radiation therapy: Secondary | ICD-10-CM | POA: Diagnosis not present

## 2016-01-27 MED ORDER — OXYCODONE HCL 5 MG PO TABS: 5.0000 mg | ORAL_TABLET | ORAL | Status: DC | PRN

## 2016-01-27 NOTE — Progress Notes (Signed)
Department of Radiation Oncology  Phone:  [phone] Fax:        [phone]  Weekly Treatment Note    Name: Fred [name] Date: 01/27/2016 MRN: [id number] DOB: 02-26-1941   Diagnosis:     ICD-9-CM ICD-10-CM   1. Malignant neoplasm of lower third of esophagus ([place]) 150.5 C15.5      Current dose: 43.2 Gy  Current fraction: 24   MEDICATIONS: Current Outpatient Prescriptions  Medication Sig Dispense Refill  . atenolol (TENORMIN) 25 MG tablet Take 25 mg by mouth daily.    . fluconazole (DIFLUCAN) 100 MG tablet Take two tabs PO on first day, then one tab po daily for 14 more days 16 tablet 0  . hyaluronate sodium (RADIAPLEXRX) GEL Apply 1 application topically 2 (two) times daily.    Marland Kitchen omeprazole (PRILOSEC) 20 MG capsule Take 20 mg by mouth daily.    . ondansetron (ZOFRAN) 8 MG tablet Take 1 tablet (8 mg total) by mouth every 8 (eight) hours as needed for nausea. 30 tablet 3  . polyethylene glycol (MIRALAX / GLYCOLAX) packet Take 17 g by mouth daily as needed.    . prochlorperazine (COMPAZINE) 10 MG tablet Take 1 tablet (10 mg total) by mouth every 6 (six) hours as needed. 30 tablet 3  . simvastatin (ZOCOR) 20 MG tablet Take 20 mg by mouth daily at 6 PM. 1/2 tablet    . sucralfate (CARAFATE) 1 GM/10ML suspension Take 10 mLs (1 g total) by mouth 4 (four) times daily -  with meals and at bedtime. 420 mL 0  . Vitamin D, Cholecalciferol, 1000 units TABS Take 1,000 Units by mouth 2 (two) times daily. Reported on 01/23/2016    . acetaminophen (TYLENOL) 500 MG tablet Take 500 mg by mouth every 6 (six) hours as needed. Reported on 01/27/2016    . nystatin (MYCOSTATIN) 100000 UNIT/ML suspension Take 5 mLs (500,000 Units total) by mouth 4 (four) times daily. (Patient not taking: Reported on 01/27/2016) 100 mL 1  . oxyCODONE (OXY IR/ROXICODONE) 5 MG immediate release tablet Take 1-2 tablets (5-10 mg total) by mouth every 4 (four) hours as needed for severe pain. 60 tablet 0   No current  facility-administered medications for this encounter.     ALLERGIES: Review of patient's allergies indicates no known allergies.   LABORATORY DATA:  Lab Results  Component Value Date   WBC 4.8 01/23/2016   HGB 14.6 01/23/2016   HCT 41.5 01/23/2016   MCV 88.5 01/23/2016   PLT 144 01/23/2016   Lab Results  Component Value Date   NA 139 01/23/2016   K 4.1 01/23/2016   CO2 25 01/23/2016   Lab Results  Component Value Date   ALT 15 01/23/2016   AST 16 01/23/2016   ALKPHOS 83 01/23/2016   BILITOT 0.55 01/23/2016     NARRATIVE: [name] was seen today for weekly treatment management. The chart was checked and the patient's films were reviewed.  Weekly rad txs esophagus 24/28 completed. Poor appetite. Throat burns even with hycet elixir, took 2x yesterday and 30 seconds later came back up. Takes zofran for nausea. Pain 3/10 scale. Even water burns. Taking diflucan daily, and taking nystatin swish and swallow. Still has thrush On tongue. Fatigued. Reports he is kept up at night due to the pain. Notes, "This last week is really when I've had trouble".    11:11 AM BP 105/66 mmHg  Pulse 69  Temp(Src) 98.1 F (36.7 C) (Oral)  Resp 20  Wt  125 lb 14.4 oz (57.108 kg)  Wt Readings from Last 3 Encounters:  01/27/16 125 lb 14.4 oz (57.108 kg)  01/23/16 127 lb 6.4 oz (57.788 kg)  01/20/16 130 lb 3.2 oz (59.058 kg)    PHYSICAL EXAMINATION: weight is 125 lb 14.4 oz (57.108 kg). His oral temperature is 98.1 F (36.7 C). His blood pressure is 105/66 and his pulse is 69. His respiration is 20.        ASSESSMENT: The patient is doing satisfactorily with treatment.  PLAN: We will continue with the patient's radiation treatment as planned. I have written the patient a prescription for oral hycet elixir today. He is scheduled to complete treatment next week.      ------------------------------------------------  Jodelle Gross, MD, PhD  This document serves as a record of services  personally performed by Kyung Rudd, MD. It was created on his behalf by Arlyce Harman, a trained medical scribe. The creation of this record is based on the scribe's personal observations and the provider's statements to them. This document has been checked and approved by the attending provider.

## 2016-01-27 NOTE — Progress Notes (Signed)
Weekly rad txs esophagus 24/28 completed, poor appetite, throat burns even with hycet elixir, took 2x yesterday and 30 seconds laster came back up, takes zofran for nausea,  Pain 3/10 scale, even  Water burns, taking diflucan daily,   And taking nystatin swish and swallow  Still has thrush  On tongue , fatigued,   BP 105/66 mmHg  Pulse 69  Temp(Src) 98.1 F (36.7 C) (Oral)  Resp 20  Wt 125 lb 14.4 oz (57.108 kg)  Wt Readings from Last 3 Encounters:  01/27/16 125 lb 14.4 oz (57.108 kg)  01/23/16 127 lb 6.4 oz (57.788 kg)  01/20/16 130 lb 3.2 oz (59.058 kg)

## 2016-01-30 ENCOUNTER — Ambulatory Visit (HOSPITAL_BASED_OUTPATIENT_CLINIC_OR_DEPARTMENT_OTHER): Payer: No Typology Code available for payment source

## 2016-01-30 ENCOUNTER — Encounter: Payer: Self-pay | Admitting: Hematology

## 2016-01-30 ENCOUNTER — Other Ambulatory Visit (HOSPITAL_BASED_OUTPATIENT_CLINIC_OR_DEPARTMENT_OTHER): Payer: No Typology Code available for payment source

## 2016-01-30 ENCOUNTER — Ambulatory Visit: Payer: No Typology Code available for payment source | Admitting: Nutrition

## 2016-01-30 ENCOUNTER — Ambulatory Visit (HOSPITAL_BASED_OUTPATIENT_CLINIC_OR_DEPARTMENT_OTHER): Payer: No Typology Code available for payment source | Admitting: Hematology

## 2016-01-30 ENCOUNTER — Telehealth: Payer: Self-pay | Admitting: *Deleted

## 2016-01-30 ENCOUNTER — Telehealth: Payer: Self-pay | Admitting: Hematology

## 2016-01-30 ENCOUNTER — Ambulatory Visit
Admission: RE | Admit: 2016-01-30 | Discharge: 2016-01-30 | Disposition: A | Discharge status: Home / Self Care | Payer: No Typology Code available for payment source | Source: Ambulatory Visit | Attending: Radiation Oncology | Admitting: Radiation Oncology

## 2016-01-30 ENCOUNTER — Ambulatory Visit: Payer: No Typology Code available for payment source | Admitting: Hematology

## 2016-01-30 ENCOUNTER — Other Ambulatory Visit: Payer: No Typology Code available for payment source

## 2016-01-30 VITALS — BP 129/60 | HR 80 | Temp 98.8°F | Resp 20

## 2016-01-30 VITALS — BP 102/58 | HR 86 | Temp 98.7°F | Resp 18 | Ht 65.0 in | Wt 124.0 lb

## 2016-01-30 DIAGNOSIS — Z5111 Encounter for antineoplastic chemotherapy: Secondary | ICD-10-CM | POA: Diagnosis not present

## 2016-01-30 DIAGNOSIS — C155 Malignant neoplasm of lower third of esophagus: Secondary | ICD-10-CM

## 2016-01-30 DIAGNOSIS — R1310 Dysphagia, unspecified: Secondary | ICD-10-CM | POA: Diagnosis not present

## 2016-01-30 DIAGNOSIS — Z510 Encounter for antineoplastic radiation therapy: Secondary | ICD-10-CM | POA: Diagnosis not present

## 2016-01-30 DIAGNOSIS — I10 Essential (primary) hypertension: Secondary | ICD-10-CM | POA: Diagnosis not present

## 2016-01-30 DIAGNOSIS — E46 Unspecified protein-calorie malnutrition: Secondary | ICD-10-CM | POA: Diagnosis not present

## 2016-01-30 DIAGNOSIS — R634 Abnormal weight loss: Secondary | ICD-10-CM

## 2016-01-30 LAB — COMPREHENSIVE METABOLIC PANEL
ALT: 15 U/L (ref 0–55)
AST: 19 U/L (ref 5–34)
Albumin: 3.2 g/dL — ABNORMAL LOW (ref 3.5–5.0)
Alkaline Phosphatase: 88 U/L (ref 40–150)
Anion Gap: 9 mEq/L (ref 3–11)
BUN: 17 mg/dL (ref 7.0–26.0)
CHLORIDE: 103 meq/L (ref 98–109)
CO2: 26 mEq/L (ref 22–29)
Calcium: 9.3 mg/dL (ref 8.4–10.4)
Creatinine: 0.8 mg/dL (ref 0.7–1.3)
EGFR: 89 mL/min/{1.73_m2} — ABNORMAL LOW (ref >90)
GLUCOSE: 148 mg/dL — AB (ref 70–140)
POTASSIUM: 4 meq/L (ref 3.5–5.1)
SODIUM: 139 meq/L (ref 136–145)
Total Bilirubin: 0.68 mg/dL (ref 0.20–1.20)
Total Protein: 6.2 g/dL — ABNORMAL LOW (ref 6.4–8.3)

## 2016-01-30 LAB — CBC WITH DIFFERENTIAL/PLATELET
BASO%: 0.7 % (ref 0.0–2.0)
BASOS ABS: 0 10*3/uL (ref 0.0–0.1)
EOS%: 0.1 % (ref 0.0–7.0)
Eosinophils Absolute: 0 10*3/uL (ref 0.0–0.5)
HCT: 39.1 % (ref 38.4–49.9)
HGB: 13.7 g/dL (ref 13.0–17.1)
LYMPH%: 1.6 % — AB (ref 14.0–49.0)
MCH: 30.9 pg (ref 27.2–33.4)
MCHC: 35.1 g/dL (ref 32.0–36.0)
MCV: 88 fL (ref 79.3–98.0)
MONO#: 0.5 10*3/uL (ref 0.1–0.9)
MONO%: 12.4 % (ref 0.0–14.0)
NEUT#: 3.4 10*3/uL (ref 1.5–6.5)
NEUT%: 85.2 % — AB (ref 39.0–75.0)
Platelets: 129 10*3/uL — ABNORMAL LOW (ref 140–400)
RBC: 4.45 10*6/uL (ref 4.20–5.82)
RDW: 14.2 % (ref 11.0–14.6)
WBC: 4.1 10*3/uL (ref 4.0–10.3)
lymph#: 0.1 10*3/uL — ABNORMAL LOW (ref 0.9–3.3)

## 2016-01-30 MED ORDER — PALONOSETRON HCL INJECTION 0.25 MG/5ML
0.2500 mg | Freq: Once | INTRAVENOUS | Status: AC
  Administered 2016-01-30: 0.25 mg via INTRAVENOUS

## 2016-01-30 MED ORDER — SODIUM CHLORIDE 0.9 % IV SOLN
INTRAVENOUS | Status: DC
  Administered 2016-01-30: 11:00:00 via INTRAVENOUS

## 2016-01-30 MED ORDER — FAMOTIDINE IN NACL 20-0.9 MG/50ML-% IV SOLN
INTRAVENOUS | Status: AC
  Filled 2016-01-30: qty 50

## 2016-01-30 MED ORDER — DIPHENHYDRAMINE HCL 50 MG/ML IJ SOLN
INTRAMUSCULAR | Status: AC
  Filled 2016-01-30: qty 1

## 2016-01-30 MED ORDER — SODIUM CHLORIDE 0.9 % IV SOLN
164.0000 mg | Freq: Once | INTRAVENOUS | Status: AC
  Administered 2016-01-30: 160 mg via INTRAVENOUS
  Filled 2016-01-30: qty 16

## 2016-01-30 MED ORDER — FAMOTIDINE IN NACL 20-0.9 MG/50ML-% IV SOLN
20.0000 mg | Freq: Once | INTRAVENOUS | Status: AC
  Administered 2016-01-30: 20 mg via INTRAVENOUS

## 2016-01-30 MED ORDER — DEXAMETHASONE 4 MG PO TABS: 4.0000 mg | ORAL_TABLET | Freq: Every day | ORAL | Status: DC

## 2016-01-30 MED ORDER — PALONOSETRON HCL INJECTION 0.25 MG/5ML
INTRAVENOUS | Status: AC
  Filled 2016-01-30: qty 5

## 2016-01-30 MED ORDER — DIPHENHYDRAMINE HCL 50 MG/ML IJ SOLN
50.0000 mg | Freq: Once | INTRAMUSCULAR | Status: AC
  Administered 2016-01-30: 50 mg via INTRAVENOUS

## 2016-01-30 MED ORDER — SODIUM CHLORIDE 0.9 % IV SOLN
45.0000 mg/m2 | Freq: Once | INTRAVENOUS | Status: AC
  Administered 2016-01-30: 78 mg via INTRAVENOUS
  Filled 2016-01-30: qty 13

## 2016-01-30 MED ORDER — SODIUM CHLORIDE 0.9 % IV SOLN
Freq: Once | INTRAVENOUS | Status: AC
  Administered 2016-01-30: 11:00:00 via INTRAVENOUS

## 2016-01-30 MED ORDER — SODIUM CHLORIDE 0.9 % IV SOLN
20.0000 mg | Freq: Once | INTRAVENOUS | Status: AC
  Administered 2016-01-30: 20 mg via INTRAVENOUS
  Filled 2016-01-30: qty 2

## 2016-01-30 NOTE — Progress Notes (Signed)
Nutrition follow-up completed with patient during infusion for esophageal cancer. Pt receiving fluids and give prescription steroids to help with nausea.   Weight decreased and documented as 124 pounds July 10 decreased from 140 pounds June 9. This is an 11% decrease over 5 weeks. Patient does not have a feeding tube. Patient continues to report increased pain and difficulty swallowing. He has nausea and vomiting. Patient reports nausea is result of increased phlegm. Patient denies diarrhea or constipation.  Patient meets criteria for severe protein calorie malnutrition in the context of chronic illness secondary to 11% weight loss in one month and depletion of both muscle and body fat  Nutrition diagnosis: Unintended weight loss continues.  Estimated nutrition needs: 1820-2135 calories, 75-90 grams protein, greater than 2 L fluid daily.  Intervention:  Again reviewed importance of increased calories and protein to minimize further weight loss. Suggested Ensure clear to help increase kcal and protein, not tolerating regular Ensure type drinks.  Samples of Ensure clear and Boost breeze provided.  Patient is not eating 2 hours prior to radiation therapy secondary to M.D. order.  Questions were answered and teach back method was used.  Monitoring, evaluation, goals: Patient will tolerate increased calories and protein to minimize further weight loss. He will try clear liquid based supplements and eat every hour to help prevent further wt loss.   Next visit: Monday, July 18.

## 2016-01-30 NOTE — Telephone Encounter (Signed)
Per staff message and POF I have scheduled appts. Advised scheduler of appts. JMW  

## 2016-01-30 NOTE — Telephone Encounter (Signed)
Appointments per 7/10 pof already on schedule. Confirmed with patient next appointment for 7/10. Patient will get new schedule when he comes in 7/10.

## 2016-01-30 NOTE — Progress Notes (Signed)
[place]  Telephone:([phone]) [phone] Fax:(336) [phone]  Clinic Follow up Note   Patient Care Team: Fred Langton, MD as PCP - General (Internal Medicine) Fred Merle, MD as Consulting Physician (Hematology) Fred Rudd, MD as Consulting Physician (Radiation Oncology) Fred Isaac, MD as Consulting Physician (Cardiothoracic Surgery)   CHIEF COMPLAINTS:  Follow up esophageal adenocarcinoma  Oncology History   Presented with solid dysphagia at least daily with chest pain. Involuntary weight loss of 10-12 lb over past year  Malignant neoplasm of lower third of esophagus (HCC)   Staging form: Esophagus - Adenocarcinoma, AJCC 7th Edition     Clinical stage from 12/15/2015: Stage IIIA (T3, N1, M0) - Signed by Fred Merle, MD on 12/24/2015       Malignant neoplasm of lower third of esophagus ([place])   11/23/2015 Procedure EGD per [facility](Dr. [place]):6cm mass in lower third of esophagus   11/24/2015 Initial Diagnosis Esophageal cancer ([place])   11/24/2015 Pathology Results Mucinous adenocarcinoma, moderately differentiated-Her2 analysis pending   12/05/2015 Imaging PET scan showed hypermetabolic a mass in distal esophagus, hypermetabolic activity in the left anterior prostate gland, metabolic density in left adrenal nodule, CT attenuation suggest a benign adrenal adenoma. Left apical 2 mm lung nodule.   12/15/2015 Procedure EUS showed a uT3N1 lesion from 27cm to 35cm from the incisors    12/26/2015 -  Chemotherapy Weekly carboplatin AUC 2 and Taxol 45 mg/m, with concurrent radiation   12/26/2015 -  Radiation Therapy Neoadjuvant chemoradiation to the esophageal cancer    HISTORY OF PRESENTING ILLNESS (12/14/2015):  Fred [name] 75 y.o. male is here because of His newly diagnosed esophageal cancer. He is accompanied by his wife and daughter to my clinic today.  He has had dysphagia for 2 months, mainly with solid food, no dysphagia with soft food or liquid. He also  has mild pain in mid chest when he swallows. No nausea, abdominal pain, or other discomfort. He has lost about 15 lbs in the last year, no other symoptoms. He was seen by his primary care physician at [facility], and referred to gastroenterologist Dr. Alice [name]. He underwent EGD on 11/23/2015, which showed a 6 cm mass in the lower third of esophagus. Biopsy showed adenocarcinoma, moderately differentiated. He was referred to [place] to consider neoadjuvant therapy. He is scheduled to see a Psychologist, sport and exercise at [facility] later this week.  He has had some urinary frequency, underwent TURP on 11/02/2015.   CURRENT THERAPY: concurrent chemoRT with Weekly carboplatin AUC 2 and Taxol 61m/m2, started on 12/26/2015   INTERIM HISTORY: Mr. MSangiovannireturns for follow up and last week of chemotherapy and irradiation. He has been more nauseated lately, it takes Zofran and Compazine around-the-clock, and also oxycodone 3-4 times a day. He still has intermittent nausea, and vomiting, and could not keep much fluids or food down. He lost 2-3 pounds in the past week. No fever or chills. He is more fatigued, no significant dizziness. He is able to function at home.  MEDICAL HISTORY:  Past Medical History  Diagnosis Date  . Esophageal cancer ([place] 11/23/15    lower 3rd esohagus   . Hypertension   . GERD (gastroesophageal reflux disease)     SURGICAL HISTORY: Past Surgical History  Procedure Laterality Date  . Right shoulder surgery    . Prostate biopsy  10/05/15  . Cystoscope  4/12/1    prostate    SOCIAL HISTORY: Social History   Social History  . Marital Status: Married  Spouse Name: N/A  . Number of Children: N/A  . Years of Education: N/A   Occupational History  . Not on file.   Social History Main Topics  . Smoking status: Former Smoker -- 1.00 packs/day for 10 years    Quit date: 07/24/1967  . Smokeless tobacco: Not on file  . Alcohol Use: No  . Drug Use: No  . Sexual Activity: Not Currently   Other  Topics Concern  . Not on file   Social History Narrative    FAMILY HISTORY: Family History  Problem Relation Age of Onset  . Cancer Maternal Grandfather 7    gastric cancer     ALLERGIES:  has No Known Allergies.  MEDICATIONS:  Current Outpatient Prescriptions  Medication Sig Dispense Refill  . acetaminophen (TYLENOL) 500 MG tablet Take 500 mg by mouth every 6 (six) hours as needed. Reported on 01/27/2016    . atenolol (TENORMIN) 25 MG tablet Take 25 mg by mouth daily.    . fluconazole (DIFLUCAN) 100 MG tablet Take two tabs PO on first day, then one tab po daily for 14 more days 16 tablet 0  . hyaluronate sodium (RADIAPLEXRX) GEL Apply 1 application topically 2 (two) times daily.    Marland Kitchen nystatin (MYCOSTATIN) 100000 UNIT/ML suspension Take 5 mLs (500,000 Units total) by mouth 4 (four) times daily. 100 mL 1  . ondansetron (ZOFRAN) 8 MG tablet Take 1 tablet (8 mg total) by mouth every 8 (eight) hours as needed for nausea. 30 tablet 3  . oxyCODONE (OXY IR/ROXICODONE) 5 MG immediate release tablet Take 1-2 tablets (5-10 mg total) by mouth every 4 (four) hours as needed for severe pain. 60 tablet 0  . polyethylene glycol (MIRALAX / GLYCOLAX) packet Take 17 g by mouth daily as needed.    . prochlorperazine (COMPAZINE) 10 MG tablet Take 1 tablet (10 mg total) by mouth every 6 (six) hours as needed. 30 tablet 3  . simvastatin (ZOCOR) 20 MG tablet Take 20 mg by mouth daily at 6 PM. 1/2 tablet    . sucralfate (CARAFATE) 1 GM/10ML suspension Take 10 mLs (1 g total) by mouth 4 (four) times daily -  with meals and at bedtime. 420 mL 0  . Vitamin D, Cholecalciferol, 1000 units TABS Take 1,000 Units by mouth 2 (two) times daily. Reported on 01/23/2016    . dexamethasone (DECADRON) 4 MG tablet Take 1 tablet (4 mg total) by mouth daily. 10 tablet 0  . omeprazole (PRILOSEC) 20 MG capsule Take 20 mg by mouth daily. Reported on 01/30/2016     No current facility-administered medications for this visit.     REVIEW OF SYSTEMS:   Constitutional: Denies fevers, chills or abnormal night sweats, (+) 15 lbs weight loss in the past year Eyes: Denies blurriness of vision, double vision or watery eyes Ears, nose, mouth, throat, and face: Denies mucositis or sore throat Respiratory: Denies cough, dyspnea or wheezes Cardiovascular: Denies palpitation, chest discomfort or lower extremity swelling Gastrointestinal:  Denies nausea, heartburn or change in bowel habits Skin: Denies abnormal skin rashes Lymphatics: Denies new lymphadenopathy or easy bruising Neurological:Denies numbness, tingling or new weaknesses Behavioral/Psych: Mood is stable, no new changes  All other systems were reviewed with the patient and are negative.  PHYSICAL EXAMINATION: ECOG PERFORMANCE STATUS: 2  Filed Vitals:   01/30/16 0859  BP: 102/58  Pulse: 86  Temp: 98.7 F (37.1 C)  Resp: 18   Filed Weights   01/30/16 0859  Weight: 124 lb (56.246  kg)    GENERAL:alert, no distress and comfortable SKIN: skin color, texture, turgor are normal, no rashes or significant lesions EYES: normal, conjunctiva are pink and non-injected, sclera clear OROPHARYNX:no exudate, no erythema and lips, buccal mucosa, and tongue normal  NECK: supple, thyroid normal size, non-tender, without nodularity LYMPH:  no palpable lymphadenopathy in the cervical, axillary or inguinal LUNGS: clear to auscultation and percussion with normal breathing effort HEART: regular rate & rhythm and no murmurs and no lower extremity edema ABDOMEN:abdomen soft, non-tender and normal bowel sounds Musculoskeletal:no cyanosis of digits and no clubbing  PSYCH: alert & oriented x 3 with fluent speech NEURO: no focal motor/sensory deficits  LABORATORY DATA:  I have reviewed the data as listed CBC Latest Ref Rng 01/30/2016 01/23/2016 01/16/2016  WBC 4.0 - 10.3 10e3/uL 4.1 4.8 4.7  Hemoglobin 13.0 - 17.1 g/dL 13.7 14.6 14.5  Hematocrit 38.4 - 49.9 % 39.1 41.5 42.4   Platelets 140 - 400 10e3/uL 129(L) 144 148   CMP Latest Ref Rng 01/30/2016 01/23/2016 01/16/2016  Glucose 70 - 140 mg/dl 148(H) 136 124  BUN 7.0 - 26.0 mg/dL 17.0 14.3 15.8  Creatinine 0.7 - 1.3 mg/dL 0.8 0.8 0.7  Sodium 136 - 145 mEq/L 139 139 141  Potassium 3.5 - 5.1 mEq/L 4.0 4.1 4.2  CO2 22 - 29 mEq/L _0 Calcium 8.4 - 10.4 mg/dL 9.3 9.3 9.4  Total Protein 6.4 - 8.3 g/dL 6.2(L) 6.3(L) 6.4  Total Bilirubin 0.20 - 1.20 mg/dL 0.68 0.55 0.53  Alkaline Phos 40 - 150 U/L 88 83 69  AST 5 - 34 U/L _1 ALT 0 - 55 U/L _2 Pathology report Diagnosis 11/23/2015 Mass, lower third of the esophagus, mucosal biopsy Mucinous adenocarcinoma, moderately differentiated.  HER 2 (-)   RADIOGRAPHIC STUDIES: I have personally reviewed the radiological images as listed and agreed with the findings in the report. No results found. EGD by Dr. Alice [name] 12/20/2015 Impression: -A 6 cm mass was found in the lower third of the esophagus. Multiple biopsies were performed. -A sliding small sized hiatal hernia otherwise normal mucosa in stomach into duodenum.  PET scan 12/05/2015 ([facility]) Impression #1 distal esophagus which she'll hypermetabolic mass consistent with known malignancy #2 hypermetabolic activity in the left anterior prostate gland, correlate for malignancy #3 metabolic density left adrenal nodule. CT attenuation suggest a benign adrenal adenoma which can be moderately hypermetabolic. However given history of malignancy, metastatic disease is not excluded. #4 left apical 2 mm pulmonary nodule, too small for evaluation by PET.  EUS at [facility] 12/14/2015: Impressions: Mass was noted from 27 cm to 35 cm from the in sisters. By EUS criteria and the lesion was T3, there was 1-2 metastatic regional lymph nodes.  ASSESSMENT & PLAN:  75 year old male presented with dysphagia with solid food  1. Distal esophageal adenocarcinoma, [id number] -I reviewed his CT scan, PET scan, EGD, and  the biopsy findings with patient and his family member in details. -The PET scan showed no definitive distant metastasis. 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 This will be followed in the future scan. -By EUS, he has T3 N1 stage III disease. We discussed the options of neoadjuvant chemotherapy. I recommend weekly carbo taxol, he is a candidate for esophagectomy, evaluated by Dr. Johney Maine and Dr. Pia Mau. He has decided to have surgery by Dr. Pia Mau. -He is tolerating radiation and weekly [place] and Taxol  well, but has developed more side effects lately -lab results reviewed with patient and his wife, we'll continue treatment, last chemotherapy today. -I'll give some IV first today, and again in 3 days   2. HTN -BP has been normal, will cotninue monitoring   3. Odynophagia and Nausea -He knows to take the Zofran and Compazine as needed for nausea -Due to the worsening nausea, I give him prescription of dexamethasone formula once daily for one-week -He will continue oxycodone  4. Malnutrition and weight loss -He has lost an additional 10 pounds since he started chemotherapy and radiation -I encouraged him to take nutrition supplement. He will follow up with dietitian Pamala Hurry today  Plan -Continue chemotherapy and radiation, last dose carbo and Taxol today -Normal saline 1 L today, and again in 3 days and the next week -I'll see him back in 1 week for follow up and IVF  All questions were answered. The patient knows to call the clinic with any problems, questions or concerns. I spent 20 minutes counseling the patient face to face. The total time spent in the appointment was 25 minutes and more than 50% was on counseling.     Fred Merle, MD 01/30/2016

## 2016-01-31 ENCOUNTER — Ambulatory Visit
Admission: RE | Admit: 2016-01-31 | Discharge: 2016-01-31 | Disposition: A | Discharge status: Home / Self Care | Payer: No Typology Code available for payment source | Source: Ambulatory Visit | Attending: Radiation Oncology | Admitting: Radiation Oncology

## 2016-01-31 DIAGNOSIS — Z510 Encounter for antineoplastic radiation therapy: Secondary | ICD-10-CM | POA: Diagnosis not present

## 2016-02-01 ENCOUNTER — Ambulatory Visit
Admission: RE | Admit: 2016-02-01 | Discharge: 2016-02-01 | Disposition: A | Discharge status: Home / Self Care | Payer: No Typology Code available for payment source | Source: Ambulatory Visit | Attending: Radiation Oncology | Admitting: Radiation Oncology

## 2016-02-01 ENCOUNTER — Encounter: Payer: Self-pay | Admitting: Radiation Oncology

## 2016-02-01 VITALS — BP 117/61 | HR 78 | Temp 98.0°F | Resp 16 | Wt 124.7 lb

## 2016-02-01 DIAGNOSIS — C155 Malignant neoplasm of lower third of esophagus: Secondary | ICD-10-CM

## 2016-02-01 DIAGNOSIS — Z510 Encounter for antineoplastic radiation therapy: Secondary | ICD-10-CM | POA: Diagnosis not present

## 2016-02-01 NOTE — Progress Notes (Signed)
Department of Radiation Oncology  Phone:  [phone] Fax:        [phone]  Weekly Treatment Note    Name: Fred [name] Date: 02/01/2016 MRN: [id number] DOB: [birth year]   Diagnosis:     ICD-9-CM ICD-10-CM   1. Malignant neoplasm of lower third of esophagus (HCC) 150.5 C15.5      Current dose: 48.6 Gy  Current fraction: 27   MEDICATIONS: Current Outpatient Prescriptions  Medication Sig Dispense Refill  . acetaminophen (TYLENOL) 500 MG tablet Take 500 mg by mouth every 6 (six) hours as needed. Reported on 01/27/2016    . atenolol (TENORMIN) 25 MG tablet Take 25 mg by mouth daily.    Marland Kitchen dexamethasone (DECADRON) 4 MG tablet Take 1 tablet (4 mg total) by mouth daily. 10 tablet 0  . fluconazole (DIFLUCAN) 100 MG tablet Take two tabs PO on first day, then one tab po daily for 14 more days 16 tablet 0  . hyaluronate sodium (RADIAPLEXRX) GEL Apply 1 application topically 2 (two) times daily.    Marland Kitchen nystatin (MYCOSTATIN) 100000 UNIT/ML suspension Take 5 mLs (500,000 Units total) by mouth 4 (four) times daily. 100 mL 1  . omeprazole (PRILOSEC) 20 MG capsule Take 20 mg by mouth daily. Reported on 01/30/2016    . ondansetron (ZOFRAN) 8 MG tablet Take 1 tablet (8 mg total) by mouth every 8 (eight) hours as needed for nausea. 30 tablet 3  . oxyCODONE (OXY IR/ROXICODONE) 5 MG immediate release tablet Take 1-2 tablets (5-10 mg total) by mouth every 4 (four) hours as needed for severe pain. 60 tablet 0  . polyethylene glycol (MIRALAX / GLYCOLAX) packet Take 17 g by mouth daily as needed.    . prochlorperazine (COMPAZINE) 10 MG tablet Take 1 tablet (10 mg total) by mouth every 6 (six) hours as needed. 30 tablet 3  . simvastatin (ZOCOR) 20 MG tablet Take 20 mg by mouth daily at 6 PM. 1/2 tablet    . Vitamin D, Cholecalciferol, 1000 units TABS Take 1,000 Units by mouth 2 (two) times daily. Reported on 01/23/2016    . sucralfate (CARAFATE) 1 GM/10ML suspension Take 10 mLs (1 g total) by mouth 4  (four) times daily -  with meals and at bedtime. (Patient not taking: Reported on 02/01/2016) 420 mL 0   No current facility-administered medications for this encounter.     ALLERGIES: Review of patient's allergies indicates no known allergies.   LABORATORY DATA:  Lab Results  Component Value Date   WBC 4.1 01/30/2016   HGB 13.7 01/30/2016   HCT 39.1 01/30/2016   MCV 88.0 01/30/2016   PLT 129* 01/30/2016   Lab Results  Component Value Date   NA 139 01/30/2016   K 4.0 01/30/2016   CO2 26 01/30/2016   Lab Results  Component Value Date   ALT 15 01/30/2016   AST 19 01/30/2016   ALKPHOS 88 01/30/2016   BILITOT 0.68 01/30/2016     NARRATIVE: Fred [name] was seen today for weekly treatment management. The chart was checked and the patient's films were reviewed.  Weekly rad txs 27/28 completed, 1 mo f/u appt given,03/06/16 with Shona Simpson, PA;. Patient taking carafate Po 4x day again, still Taking oxycodone po helps staed, ate well yesterday but this am threw up thickened saliva, took zofran , started decadron 4mg  daily now, still has difficulty swallowing , had IVF's this past Monday and is scheduled for this Friday as well, [place] neff appt 02/07/16 , lab and Dr.  Feng 02/08/16, using sonafine cream to neck, erythema, skin intact.  The patient finishes treatment tomorrow.      5:05 PM BP 117/61 mmHg  Pulse 78  Temp(Src) 98 F (36.7 C) (Oral)  Resp 16  Wt 124 lb 11.2 oz (56.564 kg)  SpO2 100%  Wt Readings from Last 3 Encounters:  02/01/16 124 lb 11.2 oz (56.564 kg)  01/30/16 124 lb (56.246 kg)  01/27/16 125 lb 14.4 oz (57.108 kg)    PHYSICAL EXAMINATION: weight is 124 lb 11.2 oz (56.564 kg). His oral temperature is 98 F (36.7 C). His blood pressure is 117/61 and his pulse is 78. His respiration is 16 and oxygen saturation is 100%.        ASSESSMENT: The patient is doing satisfactorily with treatment.  PLAN: We will continue with the patient's radiation treatment  as planned.The patient will finish treatment tomorrow and then will follow up in one month.    ------------------------------------------------  Jodelle Gross, MD, PhD    This document serves as a record of services personally performed by Kyung Rudd, MD. It was created on his behalf by Truddie Hidden, a trained medical scribe. The creation of this record is based on the scribe's personal observations and the provider's statements to them. This document has been checked and approved by the attending provider.

## 2016-02-01 NOTE — Progress Notes (Signed)
wekly rad txs 27/28 completed, 1 mo f/u appt given,03/06/16 with Shona Simpson, PA;. Patient taking carafate  Po 4x day again, still  Taking oxycodone po helps staed, ate well yesterday but this am threw up thickened saliva, took zofran , started decadron 4mg  daily now, still has difficulty swallowing , had IVF's this past Monday and is scheduled for this Friday as well, [place] neff appt 02/07/16 , lab and Dr. Burr Medico 02/08/16, using sonafine cream to neck, erythema, skin intact BP 117/61 mmHg  Pulse 78  Temp(Src) 98 F (36.7 C) (Oral)  Resp 16  Wt 124 lb 11.2 oz (56.564 kg)  SpO2 100%  Wt Readings from Last 3 Encounters:  02/01/16 124 lb 11.2 oz (56.564 kg)  01/30/16 124 lb (56.246 kg)  01/27/16 125 lb 14.4 oz (57.108 kg)

## 2016-02-02 ENCOUNTER — Ambulatory Visit
Admission: RE | Admit: 2016-02-02 | Discharge: 2016-02-02 | Disposition: A | Discharge status: Home / Self Care | Payer: No Typology Code available for payment source | Source: Ambulatory Visit | Attending: Radiation Oncology | Admitting: Radiation Oncology

## 2016-02-02 ENCOUNTER — Ambulatory Visit (INDEPENDENT_AMBULATORY_CARE_PROVIDER_SITE_OTHER): Payer: No Typology Code available for payment source | Admitting: Cardiothoracic Surgery

## 2016-02-02 ENCOUNTER — Encounter: Payer: Self-pay | Admitting: Radiation Oncology

## 2016-02-02 ENCOUNTER — Other Ambulatory Visit: Payer: Self-pay | Admitting: Hematology

## 2016-02-02 ENCOUNTER — Encounter: Payer: Self-pay | Admitting: Cardiothoracic Surgery

## 2016-02-02 ENCOUNTER — Other Ambulatory Visit: Payer: Self-pay | Admitting: *Deleted

## 2016-02-02 VITALS — BP 105/60 | HR 130 | Resp 20 | Ht 65.0 in | Wt 122.0 lb

## 2016-02-02 DIAGNOSIS — Z510 Encounter for antineoplastic radiation therapy: Secondary | ICD-10-CM | POA: Diagnosis not present

## 2016-02-02 DIAGNOSIS — C155 Malignant neoplasm of lower third of esophagus: Secondary | ICD-10-CM

## 2016-02-02 MED ORDER — SODIUM CHLORIDE 0.9 % IV SOLN: Freq: Once | INTRAVENOUS | Status: DC

## 2016-02-02 NOTE — Progress Notes (Signed)
°  Radiation Oncology         [phone] ________________________________  Name: Fred [name] MRN: [id number]  Date: 02/02/2016  DOB: 1941-04-04  End of Treatment Note  Diagnosis:     ICD-9-CM ICD-10-CM   1. Malignant neoplasm of lower third of esophagus ([place]) 150.5 C15.5              Indication for treatment:  Curative       Radiation treatment dates:   12/26/2015 to 02/02/2016  Site/dose:    1. The esophagus was treated to 45 Gy in 25 fractions at 1.8 Gy per fraction. 2. The esophagus was boosted to 5.4 Gy in 3 fractions at 1.8 Gy per fraction.   Beams/energy:    1. 3D // 6X 2. 3D // 6X  Narrative: The patient tolerated radiation treatment relatively well.  The patient continued to experience difficulty swallowing with treatment.  Plan: The patient has completed radiation treatment. The patient will return to radiation oncology clinic for routine followup in one month. I advised them to call or return sooner if they have any questions or concerns related to their recovery or treatment.  ------------------------------------------------  Jodelle Gross, MD, PhD  This document serves as a record of services personally performed by Kyung Rudd, MD. It was created on his behalf by Arlyce Harman, a trained medical scribe. The creation of this record is based on the scribe's personal observations and the provider's statements to them. This document has been checked and approved by the attending provider.

## 2016-02-02 NOTE — Progress Notes (Signed)
[address]       [place] [address]             [phone]                    Quinzell Broadwell [facility] Medical Record #[id number] Date of Birth: Nov 24, 1940  Referring: Everrett Coombe, MD Primary Care: [name], MD  Chief Complaint:    Chief Complaint  Patient presents with  . Esophageal Cancer    5 week f/u further discuss surgery, completed last radiation treatment this AM    History of Present Illness:    Fred [name] 75 y.o. male is seen in the office His request is a second surgical opinion concerning his recently diagnosed adenocarcinoma the distal esophagus. The patient gives a history of many years of reflux symptoms taking acid blocking medications. For the past 3 months he had noted increasing difficulty swallowing specially solid food and bread. He was referred by the [facility] to [place] were upper GI endoscopy was performed a near obstructing mass 6 cm lower third of the esophagus was noted. 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 By EUS criteria the lesion was T3 with breaks into the muscularis propria total thickness 9 mm, there was present regional lymph nodes, staged N1. . The patient Completed radiation and chemotherapy at [facility] .    In spite of therapy the patient has continued to lose weight, getting to him 3 times a week IV fluid resuscitation.  He continues to lose weight from 140 down to 124 pounds over the past 3 weeks.   There is a family history of gastric cancer in his maternal grandfather, patient's father died of a stroke in his 55s mother died in her 1s of Alzheimer.   Patient notes that when his symptoms first  began he had chest discomfort and was referred to the [facility] ER from the [facility], he notes a stress test and echocardiogram were done at the [place] per his report he was told he had no cardiac issues  Current Activity/ Functional Status:  Patient is independent with mobility/ambulation, transfers, ADL's, IADL's.   Zubrod Score: At the time of surgery this patient's most appropriate activity status/level should be described as: [x]     0    Normal activity, no symptoms []     1    Restricted in physical strenuous activity but ambulatory, able to do out light work []     2    Ambulatory and capable of self care, unable to do work activities, up and about               >50 % of waking hours                              []     3    Only limited self care, in bed greater than 50% of waking hours []     4    Completely disabled, no self care, confined to bed or chair []     5    Moribund   Past Medical History  Diagnosis Date  . Esophageal cancer ([place])  11/23/15    lower 3rd esohagus   . Hypertension   . GERD (gastroesophageal reflux disease)     Past Surgical History  Procedure Laterality Date  . Right shoulder surgery    . Prostate biopsy  10/05/15  . Cystoscope  4/12/1    prostate    Family History  Problem Relation Age of Onset  . Cancer Maternal Grandfather 75    gastric cancer    Stomach cancer Maternal Grandfather   Social History   Social History  . Marital Status: Married    Spouse Name: N/A  . Number of Children: N/A  . Years of Education: N/A   Occupational History  . Not on file.   Social History Main Topics  . Smoking status: Former Smoker -- 1.00 packs/day for 10 years    Quit date: 07/24/1967  . Smokeless tobacco: Not on file  . Alcohol Use: No  . Drug Use: No  . Sexual Activity: Not Currently   Other Topics Concern  . Not on file   Social History Narrative    History  Smoking status  . Former Smoker -- 1.00 packs/day for 10 years  . Quit date:  07/24/1967  Smokeless tobacco  . Not on file    History  Alcohol Use No     No Known Allergies  Current Outpatient Prescriptions  Medication Sig Dispense Refill  . acetaminophen (TYLENOL) 500 MG tablet Take 500 mg by mouth every 6 (six) hours as needed. Reported on 01/27/2016    . atenolol (TENORMIN) 25 MG tablet Take 25 mg by mouth daily.    Marland Kitchen dexamethasone (DECADRON) 4 MG tablet Take 1 tablet (4 mg total) by mouth daily. 10 tablet 0  . fluconazole (DIFLUCAN) 100 MG tablet Take two tabs PO on first day, then one tab po daily for 14 more days 16 tablet 0  . hyaluronate sodium (RADIAPLEXRX) GEL Apply 1 application topically 2 (two) times daily.    Marland Kitchen nystatin (MYCOSTATIN) 100000 UNIT/ML suspension Take 5 mLs (500,000 Units total) by mouth 4 (four) times daily. 100 mL 1  . omeprazole (PRILOSEC) 20 MG capsule Take 20 mg by mouth daily. Reported on 01/30/2016    . ondansetron (ZOFRAN) 8 MG tablet Take 1 tablet (8 mg total) by mouth every 8 (eight) hours as needed for nausea. 30 tablet 3  . oxyCODONE (OXY IR/ROXICODONE) 5 MG immediate release tablet Take 1-2 tablets (5-10 mg total) by mouth every 4 (four) hours as needed for severe pain. 60 tablet 0  . polyethylene glycol (MIRALAX / GLYCOLAX) packet Take 17 g by mouth daily as needed.    . prochlorperazine (COMPAZINE) 10 MG tablet Take 1 tablet (10 mg total) by mouth every 6 (six) hours as needed. 30 tablet 3  . simvastatin (ZOCOR) 20 MG tablet Take 20 mg by mouth daily at 6 PM. 1/2 tablet    . sucralfate (CARAFATE) 1 GM/10ML suspension Take 10 mLs (1 g total) by mouth 4 (four) times daily -  with meals and at bedtime. 420 mL 0  . Vitamin D, Cholecalciferol, 1000 units TABS Take 1,000 Units by mouth 2 (two) times daily. Reported on 01/23/2016     No current facility-administered medications for this visit.      Review of Systems:     Cardiac Review of Systems: Y or N  Chest Pain [y evaluated at [place] last month ]  Resting SOB [ n   ] Exertional SOB  [n  ]  Orthopnea [  n ]   Pedal Edema [ n  ]    Palpitations [ n ] Syncope  [n  ]   Presyncope [ n  ]  General Review of Systems: [Y] = yes [  ]=no Constitional: recent weight change [  ];  Wt loss over the last 3 months [   ] anorexia [  ]; fatigue [  ]; nausea [  ]; night sweats [  ]; fever [  ]; or chills [  ];          Dental: poor dentition[  ]; Last Dentist visit:   Eye : blurred vision [  ]; diplopia [   ]; vision changes [  ];  Amaurosis fugax[  ]; Resp: cough [  ];  wheezing[  ];  hemoptysis[  ]; shortness of breath[  ]; paroxysmal nocturnal dyspnea[  ]; dyspnea on exertion[  ]; or orthopnea[  ];  GI:  gallstones[  ], vomiting[  ];  dysphagia[ y ]; [place] Muta  ];  hematochezia [  ]; heartburn[  ];   Hx of  Colonoscopy[ y ]; GU: kidney stones [  ]; hematuria[  ];   dysuria [  ];  nocturia[  ];  history of     obstruction [  ]; urinary frequency Blue.Reese  ]             Skin: rash, swelling[  ];, hair loss[  ];  peripheral edema[  ];  or itching[  ]; Musculosketetal: myalgias[  ];  joint swelling[  ];  joint erythema[  ];  joint pain[  ];  back pain[  ];  Heme/Lymph: bruising[  ];  bleeding[  ];  anemia[  ];  Neuro: TIA[  ];  headaches[  ];  stroke[  ];  vertigo[  ];  seizures[  ];   paresthesias[  ];  difficulty walking[chronic injury to left leg  ];  Psych:depression[n  ]; anxiety[ n ];  Endocrine: diabetes[  ];  thyroid dysfunction[  ];  Immunizations: Flu up to date [ y]; Pneumococcal up to date [  y];  Other:  Physical Exam: BP 105/60 mmHg  Pulse 130  Resp 20  Ht 5' 5"  (1.651 m)  Wt 122 lb (55.339 kg)  BMI 20.30 kg/m2  SpO2 97%  PHYSICAL EXAMINATION: General appearance: alert, cooperative and no distress Head: Normocephalic, without obvious abnormality, atraumatic Neck: no adenopathy, no carotid bruit, no JVD, supple, symmetrical, trachea midline and thyroid not enlarged, symmetric, no tenderness/mass/nodules Lymph nodes: Cervical, supraclavicular, and axillary  nodes normal. Resp: clear to auscultation bilaterally Back: symmetric, no curvature. ROM normal. No CVA tenderness. Cardio: regular rate and rhythm, S1, S2 normal, no murmur, click, rub or gallop GI: soft, non-tender; bowel sounds normal; no masses,  no organomegaly Extremities: extremities normal, atraumatic, no cyanosis or edema Neurologic: Grossly normal  Diagnostic Studies & Laboratory data:     Recent Lab Findings: Lab Results  Component Value Date   WBC 4.1 01/30/2016   HGB 13.7 01/30/2016   HCT 39.1 01/30/2016   PLT 129* 01/30/2016   GLUCOSE 148* 01/30/2016   ALT 15 01/30/2016   AST 19 01/30/2016   NA 139 01/30/2016   K 4.0 01/30/2016   CREATININE 0.8 01/30/2016   BUN 17.0 01/30/2016   CO2 26 01/30/2016   CT A/P with contrast 12/01/2015 Marked circumferential mid to distal esophageal wall thickening, no enlarged thoracic nodes or evidence of lung mets Small varices along the GE junction Periportal nodes measuring less  than a centimeter. There is prominent upper abdominal node adjacent to the splenic vein/SMV confluence measuring 15x20m 13 mm left adrenal indeterminate nodule. Hypodense scattered liver lesions, the largest measuring 8 mm in the right, too small to characterize Enlarged heterogeneously enhancing prostate with ill-defined margins  12/15/2015 EGD/EUS 12/15/2015 Mass noted at 27CM from the incisors, by EUS criteria the lesion is T3 with breaks in the muscularis propria, total thickness 9 mm. Based on the presene of 1-2 metastatic regional lymph nodes, N staging was N1. The radial EUS would not pass through and did not dilate given clinical information already gleaned.  PATHOLOGY: Lower third of esophagus mass biopsy 11/23/2015 Mucinous adenocarcinoma, moderately differentiated.   PET:  IMPRESSION:  1.  Distal esophageal hypermetabolic mass consistent with known malignancy. 2.  Hypermetabolic activity in the left anterior prostate gland, correlate for  malignancy. 3.  Metabolic density left adrenal nodule. 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 However, given history of malignancy, metastatic disease is not excluded. 4.  Left apical 2 mm pulmonary nodule, too small for evaluation by PET.   Result Narrative  INDICATION: C15.5: Malignant neoplasm of lower third of esophagus  TECHNIQUE: 7.1 millicuries of [id number] was administered intravenously. PET imaging was obtained from the skull base to the mid thighs. CT images were obtained for attenuation correction and localization purposes. Glucose level was 103 MG/DL. Time from  injection to scan was 62 minutes.  Comparison none.  FINDINGS:  Hypermetabolic distal esophageal mass max SUV 8.5 (image 118) consistent with known malignancy.  Hypermetabolic activity within the left anterior prostate Max SUV 19.8, no mass identified by CT.  Low density left adrenal nodule measuring 1.4 cm (image 146). 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  Remaining activity in the body is normal and physiologic. Additional findings: Left apical 2 mm pulmonary nodule (image 66) 2 small for evaluation by PET. Gallbladder sludge. No wall thickening. Bilateral renal cysts. Nonobstructing mid left renal stone. Colonic diverticulosis   I have independently reviewed the above radiology studies  and reviewed the findings with the patient.   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 Patient has continued weight loss and progressive malnutrition, in order to even consider surgical resection he needs to reach an anabolic state. I discussed with he and his wife and daughter [place] [facility] [place] including  placement of a postpyloric nasogastric tube, avoiding the use of a PEG tube since we are considering surgical resection, and starting continuous tube feedings at home.  I plan to see the patient back in 3 weeks, he will need cardiology clearance and a repeat staging CT of the chest and abdomen before proceeding with surgery.  EGrace IsaacMD      [address] [place] [address]Office [phone]   Beeper [phone] 02/02/2016 1:15 PM

## 2016-02-03 ENCOUNTER — Telehealth: Payer: Self-pay

## 2016-02-03 ENCOUNTER — Telehealth: Payer: Self-pay | Admitting: *Deleted

## 2016-02-03 ENCOUNTER — Other Ambulatory Visit: Payer: Self-pay | Admitting: *Deleted

## 2016-02-03 ENCOUNTER — Ambulatory Visit (HOSPITAL_BASED_OUTPATIENT_CLINIC_OR_DEPARTMENT_OTHER): Payer: No Typology Code available for payment source

## 2016-02-03 DIAGNOSIS — C155 Malignant neoplasm of lower third of esophagus: Secondary | ICD-10-CM

## 2016-02-03 DIAGNOSIS — E46 Unspecified protein-calorie malnutrition: Secondary | ICD-10-CM | POA: Diagnosis not present

## 2016-02-03 MED ORDER — SODIUM CHLORIDE 0.9 % IV SOLN
INTRAVENOUS | Status: DC
  Administered 2016-02-03: 11:00:00 via INTRAVENOUS

## 2016-02-03 NOTE — Telephone Encounter (Signed)
Received vm call from pt's daughter, Neomia Dear stating she is very concerned about her father & his nutritional needs & afraid for him to go through the weekend without eating.  She wants to know if feeding tube can be scheduled sooner.  Called [facility] & scheduler was able to schedule pt for Sat am @ 10 am at [facility].  Instructed daughter to have pt there by 9:45 am at ED for outpt radiology appt & not to be admitted to ED.  Daughter was very pleased that we were able to get this scheduled.

## 2016-02-03 NOTE — Patient Instructions (Signed)

## 2016-02-03 NOTE — Telephone Encounter (Signed)
Called [facility]/Karen to set up teaching for feedings & she needs orders for amt & type of feeding.  She also reviewed chart & states pt has [facility] choice & needs approval with them before they can see pt.  Called [facility] & was told that some form needed to be sent.  Discussed with Darlena/Managed Care & she tried to call VA & received message that they were closed.  Discussed with Dr Burr Medico & informed pt is not eating at all & only taking in sips of liquids b/c throat hurts & states probably only an ounce/day.  Discussed carafate instructions & encouraged pain med 30"-1 hr before trying to drink.  He doesn't want to be admitted.  Informed to go to ED over w/e if he feels weak, faint, dizzy, etc b/c he may need more IVF.  Dr Burr Medico will try to reach dietition in am although we tried this eve without success.  Encouraged pt to go ahead & get NG tube placed in am as discussed & Dr Burr Medico will try to call him later perhaps tomorrow.  Pt concerned but expressed understanding.

## 2016-02-03 NOTE — Telephone Encounter (Signed)
Wife called asking about a home nurse to help when pt gets a feeding tube placed. S/w [place] about this and she will place [facility] referral. Confirmed the procedure is at [facility] hospital (not [facility]). Discussed NPO after midnight except BP med in am with sip of water.

## 2016-02-04 ENCOUNTER — Telehealth: Payer: Self-pay | Admitting: Hematology

## 2016-02-04 ENCOUNTER — Ambulatory Visit (HOSPITAL_COMMUNITY)
Admission: AD | Admit: 2016-02-04 | Discharge: 2016-02-04 | Disposition: A | Discharge status: Home / Self Care | Payer: No Typology Code available for payment source | Source: Ambulatory Visit | Attending: Hematology | Admitting: Hematology

## 2016-02-04 ENCOUNTER — Ambulatory Visit (HOSPITAL_COMMUNITY)
Admission: RE | Admit: 2016-02-04 | Discharge: 2016-02-04 | Disposition: A | Discharge status: Home / Self Care | Payer: No Typology Code available for payment source | Source: Ambulatory Visit | Attending: Hematology | Admitting: Hematology

## 2016-02-04 ENCOUNTER — Encounter: Payer: Self-pay | Admitting: Dietician

## 2016-02-04 ENCOUNTER — Telehealth: Payer: Self-pay | Admitting: Dietician

## 2016-02-04 DIAGNOSIS — Z4659 Encounter for fitting and adjustment of other gastrointestinal appliance and device: Secondary | ICD-10-CM | POA: Insufficient documentation

## 2016-02-04 DIAGNOSIS — C155 Malignant neoplasm of lower third of esophagus: Secondary | ICD-10-CM | POA: Diagnosis present

## 2016-02-04 MED ORDER — IOHEXOL 350 MG/ML SOLN: 50.0000 mL | Freq: Once | INTRAVENOUS | Status: DC | PRN

## 2016-02-04 MED ORDER — LIDOCAINE VISCOUS 2 % MT SOLN: 15.0000 mL | Freq: Once | OROMUCOSAL | Status: DC

## 2016-02-04 MED ORDER — DIATRIZOATE MEGLUMINE & SODIUM 66-10 % PO SOLN
50.0000 mL | Freq: Once | ORAL | Status: DC
  Filled 2016-02-04: qty 60

## 2016-02-04 NOTE — Telephone Encounter (Signed)
Pt is scheduled for [place] tube placement by radiology at [facility] this morning. My nurse did try to contact [place] to get approval for his home care need of his tube feeding yesterday afternoon but was not successful. I contacted inpt dietician Blake Divine this morning who kindly agreed to see pt and do nutrition consult for his tube feeding. I also contacted IR Dr. Pascal Lux for his post-pylori NJ tube placement. I spoke with [name] through the above process and answered her questions the best I can. I will check on pt later today to see how tube feeding is going.   My office will continue contacting [place] on Monday 7/17.  Fred [name]  02/04/2016  11:16 AM

## 2016-02-04 NOTE — Telephone Encounter (Signed)
On call case manager returned call. States that patients payer source is [place] and they are unavailable to approve any supplies on weekend. She states that without a payer source, pt will be unable to receive equipment. She stated ideally  that MD would write order for [facility] RN and then the case managers would refer to agency. She did state that pt could pay out pocket for supplies at a pharmacy if order was written, save the receipts, and then get VA to reimburse next week. However, this not include any pump or education  Burtis Junes RD, LDN, CNSC Clinical Nutrition Pager: [phone] 02/04/2016 10:43 AM

## 2016-02-04 NOTE — Progress Notes (Signed)
CM received call from [facility], [place]  from [facility] [phone])  inquiring about procuring DME for pt who is having [place] tube placed today at [facility] as Outpt and will need Pump and Formula as [place] can not be used for [place] Feeds. CM learned this as I had suggested family purchase formula to be used over weekend until [place] could be obtained. ) Ovid Curd tells me he has spoken to Gordon , rep for [facility] who informed him he is unable to provide DME for this pt, as he has [employer] as his payor and this must have prior authorization. [facility] had spoken to ? In reference to this pt on Friday 02/03/2016 and relayed this same information. [place] agencies are not allowed to provide DME for VA pts. This must be approved bt the [employer]. (per Tiffany with [facility]). Will continue to follow this case.

## 2016-02-04 NOTE — Telephone Encounter (Signed)
Received call from MD regarding need for TF education for NJ tube that pt is getting placed. Spoke with family. They have no supplies at home. Ideally, due to [place] placement he would need pump for best tolerances. Called [facility] on call and he stated that because patient is VA they need to go through them. He reccommended that RD call on call case manager at [facility]. Har[place] did this and stated she would reach out to Va. Waiting for return call  Burtis Junes RD, LDN, [place] Nutrition Pager: [phone] 02/04/2016 10:34 AM

## 2016-02-04 NOTE — Telephone Encounter (Signed)
Pt is scheduled for NG tube placement

## 2016-02-04 NOTE — Progress Notes (Signed)
Pt had [place] tube placed today. RN showed pt and family how to flush and care for tube. RD from 6/26 estimated pt needs as [id number] kcals. Pt family does not have TF, but they do have Ensure Plus to hold them over weekend. To meet needs, pt would need 6 bottles. Given Ensure not formulated for enteral feeding and to help meet hydration, RD reccommended diluting each bottle with 150 cc free water to thin formula. Will also provide additional 1 liter free water.  They are shown how to gravity drip the Ensure formula through tube. Reccommended infusing 1 bottle every couple hours and they can adjust as tolerates.    Told to monitor for cramping/diarrhea/distension which potentially could indicate intolerance. Told to monitor urine color to assess hydration.   Family to contact [place] and RD Monday to get pump orders with appropriate TF regimen.   Burtis Junes RD, LDN, CNSC Clinical Nutrition Pager: [phone] 02/04/2016 11:30 AM

## 2016-02-06 ENCOUNTER — Telehealth: Payer: Self-pay | Admitting: *Deleted

## 2016-02-06 ENCOUNTER — Other Ambulatory Visit: Payer: Self-pay | Admitting: *Deleted

## 2016-02-06 DIAGNOSIS — C155 Malignant neoplasm of lower third of esophagus: Secondary | ICD-10-CM

## 2016-02-06 NOTE — Telephone Encounter (Signed)
"  Calling for my husband to check on the status of the [place] Nurse for feedings."   Advised collaborative will try to reach the [place] today to authorize [place] as last week was unsuccessful.   "He had a tube placed this weekend.  I'm having to dilute ensure to gravity drip every two hours.  He can't take it.  He feels full and says stop.  He got in one and a half cans diluted Ensure yesterday and a half a can so far today.  Dr. Servando Snare says there's a pump that can be used instead of gravity.  We'd like the pump.  I can't go anywhere having to do this every few hours.  Dr. Servando Snare can't do surgery until he gains weight and he can't gain with one can a day."

## 2016-02-06 NOTE — Telephone Encounter (Signed)
Oncology Nurse Navigator Documentation  Oncology Nurse Navigator Flowsheets 02/06/2016  Navigator Location [facility]  Navigator Encounter Type Telephone;Letter/Fax/Email  Telephone Incoming Call;Outgoing Call  Abnormal Finding Date -  Confirmed Diagnosis Date -  Treatment Initiated Date -  Patient Visit Type -  Treatment Phase Active Tx  Barriers/Navigation Needs Coordination of Care;Family concerns  Education Accessing Care/ Finding Providers  Interventions Coordination of Care--faxed dietician orders to [facility] [phone] and called to let RN know they were there. Needs to begin his feedings tonight please.  Referrals -  [place] for DME-needs Osmolite 1.5 and pump and supplies  Education Method Verbal  Support Groups/Services -  Specialty Items/DME PEG care supplies--[place] tube supplies  Acuity Level 2  Time Spent with Patient 18  Daughter concerned that they have not heard from anyone about tube feeding formula and supply delivery for feedings in home. She is anxious he is not getting good intake and not tolerating the gravity feedings of Ensure (diluted) they were given. They have left messages with his GI Navigator with the [facility]. This RN confirmed with [facility] that they will not service VA patient without an authorization from them. Informed him that he has private insurance also-bill this policy until we can hear from the [place]. Dietician, Jarome Matin provided tube feeding orders and these were signed and faxed to [facility] with a follow up call to let them know it was faxed. Instructed to deliver today and call daughter. Return call from [place] at [place] in [place] he does need a PA from the PCP at his local [place] clinic. She requested feeding orders be sent to her and she will work on the PA with his PCP. Faxed the office note from Dr. Servando Snare, tube feeding placement procedure note from radiology and tube feeding orders signed by Dr. Burr Medico.   Daughter called back at 1605: Left VM that [facility] called and said it will take 48 hours to get feeding/supplies delivered.  Called [facility] @ 1435: Was told by Cory Munch, after speaking with her supervisor that they should be able to deliver to them this evening. Return call to daughter at 36: They will accept delivery any time tonight. She is very upset with [facility] Choice Plan causing delay in his care and plans to contact her elected congressman to report this.

## 2016-02-06 NOTE — Telephone Encounter (Signed)
GI navigator [name] called patient's daughter back today, and home care services will bill his secondary insurance instead of [place] for his nutritional service and pump. He likely will start the using the pump tonight.  Truitt Merle  02/06/2016 5:30 PM

## 2016-02-07 ENCOUNTER — Encounter: Payer: No Typology Code available for payment source | Admitting: Nutrition

## 2016-02-07 ENCOUNTER — Other Ambulatory Visit: Payer: Self-pay | Admitting: *Deleted

## 2016-02-07 ENCOUNTER — Telehealth: Payer: Self-pay | Admitting: *Deleted

## 2016-02-07 DIAGNOSIS — C155 Malignant neoplasm of lower third of esophagus: Secondary | ICD-10-CM

## 2016-02-07 NOTE — Telephone Encounter (Signed)
Oncology Nurse Navigator Documentation  Oncology Nurse Navigator Flowsheets 02/07/2016  Navigator Location [facility]  Navigator Encounter Type Telephone  Telephone Outgoing Call;Patient Update--feeding formula and supplies arrived last night. Nurse will come out today between 1-2 pm for instructions.  Abnormal Finding Date -  Confirmed Diagnosis Date -  Treatment Initiated Date -  Patient Visit Type -  Treatment Phase Post-Tx Follow-up  Barriers/Navigation Needs Coordination of Care;Education  Education Other--explained that he still gets the nutrition with it going into duodenum (does not need to go to stomach); he will have bowel movements once feedings start. Nurse may start at low rate and give him a schedule to increase rate based on tolerance; continue free water. Encouraged them to keep the appointment on 7/19 for OV and IV fluids.  Interventions Coordination of Care;Education Method  Referrals -  Coordination of Care -Moved dietician appointment today to tomorrow while he is getting IV fluids  Education Method Verbal;Teach-back  Support Groups/Services -  Specialty Items/DME -  Acuity -  Time Spent with Patient 51  Called wife to check on delivery of his supplies and if they started using it yet. Encouraged her to write down all her questions as they come up to discuss with MD and dietician tomorrow.

## 2016-02-08 ENCOUNTER — Encounter: Payer: Self-pay | Admitting: Hematology

## 2016-02-08 ENCOUNTER — Ambulatory Visit (HOSPITAL_BASED_OUTPATIENT_CLINIC_OR_DEPARTMENT_OTHER): Payer: No Typology Code available for payment source

## 2016-02-08 ENCOUNTER — Ambulatory Visit: Payer: No Typology Code available for payment source | Admitting: Nutrition

## 2016-02-08 ENCOUNTER — Encounter: Payer: No Typology Code available for payment source | Admitting: Hematology

## 2016-02-08 ENCOUNTER — Ambulatory Visit (HOSPITAL_COMMUNITY)
Admission: RE | Admit: 2016-02-08 | Discharge: 2016-02-08 | Disposition: A | Discharge status: Home / Self Care | Payer: No Typology Code available for payment source | Source: Ambulatory Visit | Attending: Hematology | Admitting: Hematology

## 2016-02-08 ENCOUNTER — Other Ambulatory Visit: Payer: No Typology Code available for payment source

## 2016-02-08 ENCOUNTER — Ambulatory Visit (HOSPITAL_BASED_OUTPATIENT_CLINIC_OR_DEPARTMENT_OTHER): Payer: No Typology Code available for payment source | Admitting: Hematology

## 2016-02-08 ENCOUNTER — Other Ambulatory Visit: Payer: Self-pay | Admitting: *Deleted

## 2016-02-08 ENCOUNTER — Telehealth: Payer: Self-pay | Admitting: *Deleted

## 2016-02-08 VITALS — BP 93/53 | HR 106 | Temp 97.8°F | Resp 17 | Ht 65.0 in | Wt 121.1 lb

## 2016-02-08 DIAGNOSIS — Z978 Presence of other specified devices: Secondary | ICD-10-CM | POA: Insufficient documentation

## 2016-02-08 DIAGNOSIS — R195 Other fecal abnormalities: Secondary | ICD-10-CM | POA: Diagnosis not present

## 2016-02-08 DIAGNOSIS — E46 Unspecified protein-calorie malnutrition: Secondary | ICD-10-CM | POA: Insufficient documentation

## 2016-02-08 DIAGNOSIS — I959 Hypotension, unspecified: Secondary | ICD-10-CM

## 2016-02-08 DIAGNOSIS — R1310 Dysphagia, unspecified: Secondary | ICD-10-CM

## 2016-02-08 DIAGNOSIS — C155 Malignant neoplasm of lower third of esophagus: Secondary | ICD-10-CM | POA: Insufficient documentation

## 2016-02-08 DIAGNOSIS — R110 Nausea: Secondary | ICD-10-CM

## 2016-02-08 MED ORDER — UNABLE TO FIND: 1000.0000 mL | Status: DC

## 2016-02-08 MED ORDER — SODIUM CHLORIDE 0.9 % IV SOLN
1000.0000 mL | Freq: Once | INTRAVENOUS | Status: AC
  Administered 2016-02-08: 1000 mL via INTRAVENOUS

## 2016-02-08 MED ORDER — OXYCODONE HCL 5 MG PO TABS: 5.0000 mg | ORAL_TABLET | ORAL | Status: DC | PRN

## 2016-02-08 NOTE — Progress Notes (Signed)
Nutrition follow-up completed with patient being treated for esophageal cancer. Patient is status post nasojejunal feeding tube.  (Tube was placed post pylorus.) Current weight documented as 121.1 pounds July 19, down from 124 pounds July 10.  Patient has lost 13% body weight over 7 weeks. Patient does not feel well.  He is anxious to increase feeding through [place] tube. Patient apparently tolerated 2 cans of Osmolite 1.5 yesterday. Reports nausea and some vomiting secondary to thick mucus. Patient reports brown liquid came back through his feeding tube when he was vomiting this morning. No bowel movement for the past 3 or 4 days. Patient has severe protein calorie malnutrition.  Nutrition diagnosis: Unintended weight loss continues.  Estimated nutrition needs: 1820-2135 calories, 75-90 grams protein, greater than 2 L fluid daily.  Intervention: Educated patient, wife, and daughter on tube feeding advancements with written instructions provided. Patient will begin Osmolite 1.5 at 40 mL an hour today at 5 PM and run continually until 8 AM. (15 hours daily) Patient was instructed to flush feeding tube with 120 cc free water before and after continuous feeding. Patient will increase Osmolite 1.5 - 10 cc every 2 days until goal rate of 96 mL an hour is achieved. Recommend checking CMET, phosphorus and magnesium twice weekly to monitor for refeeding syndrome. M.D. is ordering CT scan to check feeding tube placement.  Patient will receive 1 L IV fluids today in the infusion room. Questions were answered.  Teach back method used.  Contact information was provided.  6 cans Osmolite 1.5 at goal rate will provide 2130 cal, 89 g protein, 1086 mL free water.  Patient is receiving an additional 240 mL free water via feeding tube.  He will also receive 1 L of IV fluids daily this week.  Once special tube feeding pump delivered to patient, patient will receive 60 mL free water flush hourly delivered by pump for  15 hours providing 1140 cc free water. (total of 2466 mL free water daily)  Monitoring, evaluation, goals: Patient will tolerate tube feeding advancement to minimize further weight loss and avoiding refeeding syndrome.  Next visit: I will follow-up with patient by phone as needed.  **Disclaimer: This note was dictated with voice recognition software. Similar sounding words can inadvertently be transcribed and this note may contain transcription errors which may not have been corrected upon publication of note.**

## 2016-02-08 NOTE — Progress Notes (Signed)
[facility]  [phone] [phone]  Clinic Follow up Note   Patient Care Team: [name], MD as PCP - General (Internal Medicine) Truitt Merle, MD as Consulting Physician (Hematology) Kyung Rudd, MD as Consulting Physician (Radiation Oncology) [name], MD as Consulting Physician (Cardiothoracic Surgery) Karie Mainland, RD as Dietitian (Nutrition)   CHIEF COMPLAINTS:  Follow up esophageal adenocarcinoma  Oncology History   Presented with solid dysphagia at least daily with chest pain. Involuntary weight loss of 10-12 lb over past year  Malignant neoplasm of lower third of esophagus (HCC)   Staging form: Esophagus - Adenocarcinoma, AJCC 7th Edition     Clinical stage from 12/15/2015: Stage IIIA (T3, N1, M0) - Signed by Truitt Merle, MD on 12/24/2015       Malignant neoplasm of lower third of esophagus ([place])   11/23/2015 Procedure EGD per [facility](Dr. Toledo):6cm mass in lower third of esophagus   11/24/2015 Initial Diagnosis Esophageal cancer ([place])   11/24/2015 Pathology Results Mucinous adenocarcinoma, moderately differentiated-Her2 analysis pending   12/05/2015 Imaging PET scan showed hypermetabolic a mass in distal esophagus, hypermetabolic activity in the left anterior prostate gland, metabolic density in left adrenal nodule, CT attenuation suggest a benign adrenal adenoma. Left apical 2 mm lung nodule.   12/15/2015 Procedure EUS showed a uT3N1 lesion from 27cm to 35cm from the incisors    12/26/2015 -  Chemotherapy Weekly carboplatin AUC 2 and Taxol 45 mg/m, with concurrent radiation   12/26/2015 -  Radiation Therapy Neoadjuvant chemoradiation to the esophageal cancer    HISTORY OF PRESENTING ILLNESS (12/14/2015):  Phillippe Orlick 75 y.o. male is here because of His newly diagnosed esophageal cancer. He is accompanied by his wife and daughter to my clinic today.  He has had dysphagia for 2 months, mainly with solid food, no  dysphagia with soft food or liquid. He also has mild pain in mid chest when he swallows. No nausea, abdominal pain, or other discomfort. He has lost about 15 lbs in the last year, no other symoptoms. He was seen by his primary care physician at [facility], and referred to gastroenterologist Dr. Alice Reichert. He underwent EGD on 11/23/2015, which showed a 6 cm mass in the lower third of esophagus. Biopsy showed adenocarcinoma, moderately differentiated. He was referred to [place] to consider neoadjuvant therapy. He is scheduled to see a Psychologist, sport and exercise at [facility] later this week.  He has had some urinary frequency, underwent TURP on 11/02/2015.   CURRENT THERAPY: concurrent chemoRT with Weekly carboplatin AUC 2 and Taxol 73m/m2, started on 12/26/2015   INTERIM HISTORY: Mr. MVolkerreturns for follow up. He was seen by Dr. GPia Maulast week, due to his significant weight loss, and dysphagia, Dr. GPia Maurecommended NJ-tube placement, which was done on 02/04/2016. Dietitian recommended tube feeds to be delivered by a pump. Due to the issue of his insurance and we can schedule, the pump was not delivered by home care until 2 days ago. His tube feeds has been gradually increased, he tolerated writing well, but his wife was overwhelmed by the care of tube feeding (frequent flush). Home care has started IV fluids yesterday, he came in today for follow-up. He states his pain is readily controlled, he takes oxycodone 4-6 tablets a day. He still has severe dysphagia, could not tolerate even water. He has mild intermittent nausea, for which he has been taking Zofran. He feels quite fatigued, but no dizziness, no fever or chills, he had 1 episode  of diarrhea with the first day of feeding, and has not had a bowel movement for the past 2-3 days.   MEDICAL HISTORY:  Past Medical History  Diagnosis Date  . Esophageal cancer ([place]) 11/23/15    lower 3rd esohagus   . Hypertension   . GERD (gastroesophageal reflux disease)     SURGICAL  HISTORY: Past Surgical History  Procedure Laterality Date  . Right shoulder surgery    . Prostate biopsy  10/05/15  . Cystoscope  4/12/1    prostate    SOCIAL HISTORY: Social History   Social History  . Marital Status: Married    Spouse Name: N/A  . Number of Children: N/A  . Years of Education: N/A   Occupational History  . Not on file.   Social History Main Topics  . Smoking status: Former Smoker -- 1.00 packs/day for 10 years    Quit date: 07/24/1967  . Smokeless tobacco: Not on file  . Alcohol Use: No  . Drug Use: No  . Sexual Activity: Not Currently   Other Topics Concern  . Not on file   Social History Narrative    FAMILY HISTORY: Family History  Problem Relation Age of Onset  . Cancer Maternal Grandfather 50    gastric cancer     ALLERGIES:  has No Known Allergies.  MEDICATIONS:  Current Outpatient Prescriptions  Medication Sig Dispense Refill  . atenolol (TENORMIN) 25 MG tablet Take 25 mg by mouth daily.    . hyaluronate sodium (RADIAPLEXRX) GEL Apply 1 application topically 2 (two) times daily.    Marland Kitchen nystatin (MYCOSTATIN) 100000 UNIT/ML suspension Take 5 mLs (500,000 Units total) by mouth 4 (four) times daily. 100 mL 1  . oxyCODONE (OXY IR/ROXICODONE) 5 MG immediate release tablet Take 1-2 tablets (5-10 mg total) by mouth every 4 (four) hours as needed for severe pain. 100 tablet 0  . simvastatin (ZOCOR) 20 MG tablet Take 20 mg by mouth daily at 6 PM. 1/2 tablet    . acetaminophen (TYLENOL) 500 MG tablet Take 500 mg by mouth every 6 (six) hours as needed. Reported on 02/08/2016    . dexamethasone (DECADRON) 4 MG tablet Take 1 tablet (4 mg total) by mouth daily. (Patient not taking: Reported on 02/08/2016) 10 tablet 0  . fluconazole (DIFLUCAN) 100 MG tablet Take two tabs PO on first day, then one tab po daily for 14 more days (Patient not taking: Reported on 02/08/2016) 16 tablet 0  . omeprazole (PRILOSEC) 20 MG capsule Take 20 mg by mouth daily. Reported on  02/08/2016    . ondansetron (ZOFRAN) 8 MG tablet Take 1 tablet (8 mg total) by mouth every 8 (eight) hours as needed for nausea. (Patient not taking: Reported on 02/08/2016) 30 tablet 3  . polyethylene glycol (MIRALAX / GLYCOLAX) packet Take 17 g by mouth daily as needed. Reported on 02/08/2016    . prochlorperazine (COMPAZINE) 10 MG tablet Take 1 tablet (10 mg total) by mouth every 6 (six) hours as needed. (Patient not taking: Reported on 02/08/2016) 30 tablet 3  . sucralfate (CARAFATE) 1 GM/10ML suspension Take 10 mLs (1 g total) by mouth 4 (four) times daily -  with meals and at bedtime. (Patient not taking: Reported on 02/08/2016) 420 mL 0  . UNABLE TO FIND Inject 1,000 mLs into the vein as directed. Please give 1L  Normal Saline over 2 hours IV on   7/20,  7/21,  7/24. 3000 mL 1  . Vitamin D, Cholecalciferol, 1000  units TABS Take 1,000 Units by mouth 2 (two) times daily. Reported on 02/08/2016     No current facility-administered medications for this visit.   Facility-Administered Medications Ordered in Other Visits  Medication Dose Route Frequency Provider Last Rate Last Dose  . 0.9 %  sodium chloride infusion   Intravenous Once Truitt Merle, MD        REVIEW OF SYSTEMS:   Constitutional: Denies fevers, chills or abnormal night sweats, very fatigued, (+) weight loss  Eyes: Denies blurriness of vision, double vision or watery eyes Ears, nose, mouth, throat, and face: Denies mucositis or sore throat Respiratory: Denies cough, dyspnea or wheezes Cardiovascular: Denies palpitation, chest discomfort or lower extremity swelling Gastrointestinal:  Denies nausea, heartburn or change in bowel habits Skin: Denies abnormal skin rashes Lymphatics: Denies new lymphadenopathy or easy bruising Neurological:Denies numbness, tingling or new weaknesses Behavioral/Psych: Mood is stable, no new changes  All other systems were reviewed with the patient and are negative.  PHYSICAL EXAMINATION: ECOG PERFORMANCE  STATUS: 3  Filed Vitals:   02/08/16 1044  BP: 93/53  Pulse: 106  Temp: 97.8 F (36.6 C)  Resp: 17   Filed Weights   02/08/16 1044  Weight: 121 lb 1.6 oz (54.931 kg)    GENERAL:alert, no distress and comfortable SKIN: skin color, texture, turgor are normal, no rashes or significant lesions EYES: normal, conjunctiva are pink and non-injected, sclera clear OROPHARYNX:no exudate, no erythema and lips, buccal mucosa, and tongue normal  NECK: supple, thyroid normal size, non-tender, without nodularity LYMPH:  no palpable lymphadenopathy in the cervical, axillary or inguinal LUNGS: clear to auscultation and percussion with normal breathing effort HEART: regular rate & rhythm and no murmurs and no lower extremity edema ABDOMEN:abdomen soft, non-tender and normal bowel sounds Musculoskeletal:no cyanosis of digits and no clubbing  PSYCH: alert & oriented x 3 with fluent speech NEURO: no focal motor/sensory deficits  LABORATORY DATA:  I have reviewed the data as listed CBC Latest Ref Rng 01/30/2016 01/23/2016 01/16/2016  WBC 4.0 - 10.3 10e3/uL 4.1 4.8 4.7  Hemoglobin 13.0 - 17.1 g/dL 13.7 14.6 14.5  Hematocrit 38.4 - 49.9 % 39.1 41.5 42.4  Platelets 140 - 400 10e3/uL 129(L) 144 148   CMP Latest Ref Rng 01/30/2016 01/23/2016 01/16/2016  Glucose 70 - 140 mg/dl 148(H) 136 124  BUN 7.0 - 26.0 mg/dL 17.0 14.3 15.8  Creatinine 0.7 - 1.3 mg/dL 0.8 0.8 0.7  Sodium 136 - 145 mEq/L 139 139 141  Potassium 3.5 - 5.1 mEq/L 4.0 4.1 4.2  CO2 22 - 29 mEq/L _0 Calcium 8.4 - 10.4 mg/dL 9.3 9.3 9.4  Total Protein 6.4 - 8.3 g/dL 6.2(L) 6.3(L) 6.4  Total Bilirubin 0.20 - 1.20 mg/dL 0.68 0.55 0.53  Alkaline Phos 40 - 150 U/L 88 83 69  AST 5 - 34 U/L _1 ALT 0 - 55 U/L _2 His lab from yesterday (done by home service): CBC: WBC 3.2, hemoglobin 11.6, platelet 152K CMP: Within normal limits except glucose 123, creatinine 0.66, total protein 4.7, albumin 2.7, calcium 8.0. Magnesium 1.9,  phosphate 2.9, potassium 3.7.   Pathology report Diagnosis 11/23/2015 Mass, lower third of the esophagus, mucosal biopsy Mucinous adenocarcinoma, moderately differentiated.  HER 2 (-)   RADIOGRAPHIC STUDIES: I have personally reviewed the radiological images as listed and agreed with the findings in the report. Dg Abd 1 View  02/08/2016  CLINICAL DATA:  Nasogastric tube placement.  Esophageal carcinoma. EXAM: ABDOMEN -  1 VIEW COMPARISON:  February 04, 2016 FINDINGS: Nasogastric tube tip is at the level of the proximal most aspect of the jejunum. The side port is in the proximal fourth portion of the duodenum. There is moderate stool in the colon. The bowel gas pattern is unremarkable. No bowel dilatation or air-fluid level to suggest obstruction. No free air. Lung bases are clear. IMPRESSION: Nasogastric tube tip is at the origin of the jejunum with the side port in the fourth portion of the duodenum. If this tube is to be used for feeding, its position is excellent for that purpose. Bowel gas pattern unremarkable. Moderate stool in colon. Lung bases clear. Electronically Signed   By: Lowella Grip III M.D.   On: 02/08/2016 15:10   Dg [place] Dys Tube Plc W/fl W/rad  02/04/2016  INDICATION: Malignant neoplasm involving the lower third of the esophagus post chemo and radiation. Please place post gastric nasogastric tube for enteric nutrition supplementation. EXAM: NASO G TUBE PLACEMENT WITH FL AND WITH RAD COMPARISON:  PET-CT - 12/05/2015 FLUOROSCOPY TIME:  6 minutes, 48 seconds COMPLICATIONS: None immediate PROCEDURE: The nasogastric tube was lubricated with viscous lidocaine inserted into the right nostril. Under intermittent fluoroscopic guidance, the Dobhoff tube was advanced through the stomach, through the duodenum with tip ultimately terminating over the expected location of the duodenal - jejunal junction. Contrast was injected via the nasogastric tube and a spot fluoroscopic image was saved for  documentation purposes. The tube was affixed to the patient's nose with tape. The patient tolerated the procedure well without immediate postprocedural complication. FINDINGS: Contrast injection demonstrates opacification of the distal aspect of the duodenum. IMPRESSION: Successful fluoroscopic guided placement of Dobhoff tube with tip terminating over the duodenojejunal junction. The tube is ready for immediate use. Electronically Signed   By: Sandi Mariscal M.D.   On: 02/04/2016 15:32   EGD by Dr. Alice Reichert 12/20/2015 Impression: -A 6 cm mass was found in the lower third of the esophagus. Multiple biopsies were performed. -A sliding small sized hiatal hernia otherwise normal mucosa in stomach into duodenum.  PET scan 12/05/2015 ([facility]) Impression #1 distal esophagus which she'll hypermetabolic mass consistent with known malignancy #2 hypermetabolic activity in the left anterior prostate gland, correlate for malignancy #3 metabolic density left adrenal nodule. CT attenuation suggest a benign adrenal adenoma which can be moderately hypermetabolic. However given history of malignancy, metastatic disease is not excluded. #4 left apical 2 mm pulmonary nodule, too small for evaluation by PET.  EUS at [facility] 12/14/2015: Impressions: Mass was noted from 27 cm to 35 cm from the in sisters. By EUS criteria and the lesion was T3, there was 1-2 metastatic regional lymph nodes.  ASSESSMENT & PLAN:  75 year old male presented with dysphagia with solid food  1. Distal esophageal adenocarcinoma, [id number] -I reviewed his CT scan, PET scan, EGD, and the biopsy findings with patient and his family member in details. -The PET scan showed no definitive distant metastasis. 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 This will be followed in the future scan. -By EUS, he has T3 N1 stage III disease. -He completed neoadjuvant radiation with weekly Carbo and  Taxol. -He has developed moderate to severe dysphagia and odynophagia, not able to take oral nutrition, has had NJ-tube placed on 02/04/2016, today's x-ray showed is in good position. -We'll continue tube feeding, this will be managed by our dietitian Pamala Hurry, she saw patient today  -Since his NG tube is  not 4 speed yet, and his blood pressure is low, I recommend normal saline 1 L today and daily for the next 2 days -Home care were to lab twice weekly on Monday and Thursday, and fax results to [place]. He will also set up IV fluids at home as needed. -lab results reviewed with patient and his wife  2. HTN -BP has been low leg, secondary to low oral and tube feeding -We'll do IV fluids as needed, daily for the next 3 days -I recommend him to decrease his atenolol to half dose, and hold it if his systolic blood pressure less than 95  3. Odynophagia and Nausea -He knows to take the Zofran and Compazine as needed for nausea -Due to the worsening nausea, I give him prescription of dexamethasone formula once daily for one-week -He will continue oxycodone  4. Malnutrition and weight loss -He has lost an additional 10-15 pounds since he started chemotherapy and radiation -He is on tube feeds now, will be managed by our [place] 1L today here, by home care service tomorrow and 7/21, 7/21 -repeat lab CBC, CMP, PHOS and MAGNESIUM twice a week on Monday and Thursday by home care service, results will be faxed over to me -I'll see him back in 1 week with IV fluids appointment  All questions were answered. The patient knows to call the clinic with any problems, questions or concerns. I spent 20 minutes counseling the patient face to face. The total time spent in the appointment was 25 minutes and more than 50% was on counseling.     Truitt Merle, MD 02/08/2016

## 2016-02-08 NOTE — Telephone Encounter (Signed)
Oncology Nurse Navigator Documentation  Oncology Nurse Navigator Flowsheets 02/08/2016  Navigator Location [facility]  Navigator Encounter Type Telephone  Telephone Incoming Call;Outgoing Call;Symptom Mgt  Abnormal Finding Date -  Confirmed Diagnosis Date -  Treatment Initiated Date -  Patient Visit Type -  Treatment Phase -  Barriers/Navigation Needs Family concerns--tube feeding concerns; asking about IV fluids instead. She is very anxious and says "I can't do this".  Education -  Interventions Other-instructed wife to keep appointment today as scheduled and bring his tube feeding instructions with them to discuss with dietician. Moved appointment to 10:15 since she did not feel they could get here today.  Coordination of Care -Moved appointment to later in am and informed her he does not need to shower to come in today. Made dietician aware of appointment time change and her anxiety. Informed wife, MD is considering admission to hospital to get him stable and tube feedings going.  Education Method -Verbal  Specialty Items/DME -  Acuity -  Time Spent with Patient 15

## 2016-02-08 NOTE — Progress Notes (Signed)
Gave script order for home IVF and lab draws by home health nurse to  Santiago Glad, RN @ [facility] today.

## 2016-02-08 NOTE — Patient Instructions (Signed)

## 2016-02-08 NOTE — Progress Notes (Signed)
This encounter was created in error - please disregard.

## 2016-02-09 ENCOUNTER — Telehealth: Payer: Self-pay | Admitting: Nutrition

## 2016-02-09 NOTE — Telephone Encounter (Signed)
Contacted patient by phone to follow up tube feeding tolerance. Patient tolerated continuous feedings at 40 mL over 15 hours providing 2.5 cans of Osmolite 1.5. Patient is to receive IVF today at home through [facility]. Patient is able to receive Nutren 1.5 through the [facility] hospital so I can transition patient to this formula when tolerance established. They received the bags for the water to be infused with tube feedings but have not received the special pump yet. Reminded them it was not expected to be delivered until Friday. Answered questions. Will contact them tomorrow by phone for follow up.

## 2016-02-10 ENCOUNTER — Telehealth: Payer: Self-pay | Admitting: Nutrition

## 2016-02-10 NOTE — Telephone Encounter (Signed)
Spoke with patient's wife on phone regarding tube feeding. Patient is tolerating tube feeds of Osmolite 1.5 at 50 cc/hr for 15 hours overnight. (543 mL free water) Patient received approximately 900 cc free water via pump overnight.  Feeding tube flushed with 120 cc free water before and after continuous feeding. (total water from feeding and flushes ~1683 mL last night. Patient also received 1 L IVF today. Lab work is pending. Patient's feet are swollen so he is going to elevate them. No BM for ~ 4 days. Wife to encourage miraLAX today.  Advance to 60 mL/hr for 15 hours nightly with 120 cc free water before and after continuous feeding and 60 cc water q hour via feeding pump. Patient will continue TF advancement as tolerated. Follow up next week.

## 2016-02-11 NOTE — Progress Notes (Addendum)
  Radiation Oncology         [phone] ________________________________  Name: Fred [name] MRN: [id number]  Date: 12/16/2015  DOB: 1941-03-13  SIMULATION AND TREATMENT PLANNING NOTE  DIAGNOSIS:     ICD-9-CM ICD-10-CM   1. Malignant neoplasm of lower third of esophagus ([place]) 150.5 C15.5      Site:  Esophagus  NARRATIVE:  The patient was brought to the [place].  Identity was confirmed.  All relevant records and images related to the planned course of therapy were reviewed.   Written consent to proceed with treatment was confirmed which was freely given after reviewing the details related to the planned course of therapy had been reviewed with the patient.  Then, the patient was set-up in a stable reproducible  supine position for radiation therapy.  CT images were obtained.  Surface markings were placed.     The CT images were loaded into the planning software.  Then the target and avoidance structures were contoured.  Treatment planning then occurred.  The radiation prescription was entered and confirmed.  A total of 10 complex treatment devices were fabricated which relate to the designed radiation treatment fields. Each of these customized fields/ complex treatment devices will be used on a daily basis during the radiation course. I have requested : 3D Simulation  I have requested a DVH of the following structures: Target, spinal cord, heart, lungs.   The patient will undergo daily image guidance to ensure accurate localization of the target, and adequate minimize dose to the normal surrounding structures in close proximity to the target.   PLAN:  The patient will initially receive 45 Gy in 25 fractions. It is anticipated that the patient will then receive a 5.4 gray boost.  ________________________________   Jodelle Gross, MD, PhD

## 2016-02-11 NOTE — Addendum Note (Signed)
Encounter addended by: Kyung Rudd, MD on: 02/11/2016  6:34 PM<BR>     Documentation filed: Notes Section

## 2016-02-11 NOTE — Progress Notes (Signed)
  Radiation Oncology         [phone] ________________________________  Name: Fred [name] MRN: [id number]  Date: 01/19/2016  DOB: 03-Dec-1940  COMPLEX SIMULATION  NOTE  Diagnosis: Esophageal cancer  Narrative The patient has initially been planned to receive a course of radiation treatment to a dose of 45 gray in 25 fractions at 1.8 gray per fraction. The patient will now receive a boost to the high risk target volume for an additional 5.4 gray. This will be delivered in 3 fractions at 1.8 gray per fraction and a cone down boost technique will be utilized. To accomplish this, an additional 7 customized blocks have been designed for this purpose. A complex isodose plan is requested to ensure that the high-risk target region receives the appropriate radiation dose and that the nearby normal structures continue to be appropriately spared. The patient's final total dose therefore will be 50.4 gray.   ________________________________ ------------------------------------------------  Jodelle Gross, MD, PhD

## 2016-02-11 NOTE — Addendum Note (Signed)
Encounter addended by: Kyung Rudd, MD on: 02/11/2016  6:33 PM<BR>     Documentation filed: Notes Section, Visit Diagnoses

## 2016-02-13 ENCOUNTER — Telehealth: Payer: Self-pay | Admitting: Nutrition

## 2016-02-13 ENCOUNTER — Telehealth: Payer: Self-pay | Admitting: *Deleted

## 2016-02-13 NOTE — Telephone Encounter (Signed)
Contacted patient's wife by phone in response to message she left me early this morning. [facility] has not seen [facility] today for fluids or for labs. Wife was concerned that these would not be completed today. Patient also complains of constipation. Patient is tolerating tube feedings of Osmolite 1.5 at 80 mL an hour for 15 hours and is receiving 60 cc of free water every hour. No problems with tube feeding tolerance. Encouraged patient wife to call nurse for clarification on IV fluids and lab work as well as to discuss constipation.

## 2016-02-13 NOTE — Telephone Encounter (Signed)
Oncology Nurse Navigator Documentation  Oncology Nurse Navigator Flowsheets 02/13/2016  Navigator Location [facility]  Navigator Encounter Type Telephone  Telephone Incoming Call;Patient Update  Abnormal Finding Date -  Confirmed Diagnosis Date -  Treatment Initiated Date -  Patient Visit Type -  Treatment Phase -  Barriers/Navigation Needs Education--Is he still getting IV fluids at home and labs on Monday and Thursday? Also has not had BM in days and can't take anything by mouth. His BP is better at 116/65, but pulse is still tachy at 120 at rest. Pedal edema much improved with elevation of feet.  Education  Symptom Management  Interventions Other-will inquire of MD  Referrals -  Coordination of Care -  Education Method -  Support Groups/Services -  Specialty Items/DME -  Acuity -  Time Spent with Patient 15  Has not seen a nurse today and could not get an answer from anyone at [facility] when she called today. Does he still need fluids? Can labs wait till Wednesday at [facility] or should they be done today at home? Can he try an enema or suppository to work on BM?  Currently TF is at 80 cc/hr over 15 hours with the water bolus ordered.

## 2016-02-13 NOTE — Telephone Encounter (Signed)
After speaking with MD, OK to try OTC Dulcolax suppository or Fleets enema. Wife chooses enema since they already have one in the home. Made her aware that OK for [facility] to give IV fluids and draw labs tomorrow early instead of trying to get out there today. Wife in agreement as long as they can get there early-he has company coming tomorrow afternoon that have not seen him in a long time. Spoke with Wilma at [facility]: They will be able to go to home tomorrow am for labs/fluids.

## 2016-02-15 ENCOUNTER — Ambulatory Visit: Payer: No Typology Code available for payment source | Admitting: Nutrition

## 2016-02-15 ENCOUNTER — Telehealth: Payer: Self-pay | Admitting: *Deleted

## 2016-02-15 ENCOUNTER — Ambulatory Visit (HOSPITAL_COMMUNITY)
Admission: RE | Admit: 2016-02-15 | Discharge: 2016-02-15 | Disposition: A | Discharge status: Home / Self Care | Payer: No Typology Code available for payment source | Source: Ambulatory Visit | Attending: Hematology | Admitting: Hematology

## 2016-02-15 ENCOUNTER — Ambulatory Visit (HOSPITAL_BASED_OUTPATIENT_CLINIC_OR_DEPARTMENT_OTHER): Payer: No Typology Code available for payment source

## 2016-02-15 ENCOUNTER — Encounter: Payer: Self-pay | Admitting: Hematology

## 2016-02-15 ENCOUNTER — Ambulatory Visit (HOSPITAL_BASED_OUTPATIENT_CLINIC_OR_DEPARTMENT_OTHER): Payer: No Typology Code available for payment source | Admitting: Hematology

## 2016-02-15 VITALS — BP 130/64 | HR 126 | Temp 97.9°F | Resp 18 | Ht 65.0 in | Wt 129.0 lb

## 2016-02-15 VITALS — BP 130/72 | HR 105 | Resp 18

## 2016-02-15 DIAGNOSIS — C155 Malignant neoplasm of lower third of esophagus: Secondary | ICD-10-CM | POA: Insufficient documentation

## 2016-02-15 DIAGNOSIS — Z4659 Encounter for fitting and adjustment of other gastrointestinal appliance and device: Secondary | ICD-10-CM | POA: Insufficient documentation

## 2016-02-15 DIAGNOSIS — F329 Major depressive disorder, single episode, unspecified: Secondary | ICD-10-CM

## 2016-02-15 DIAGNOSIS — R110 Nausea: Secondary | ICD-10-CM | POA: Diagnosis not present

## 2016-02-15 DIAGNOSIS — E46 Unspecified protein-calorie malnutrition: Secondary | ICD-10-CM | POA: Diagnosis not present

## 2016-02-15 DIAGNOSIS — Z79899 Other long term (current) drug therapy: Secondary | ICD-10-CM | POA: Insufficient documentation

## 2016-02-15 DIAGNOSIS — I10 Essential (primary) hypertension: Secondary | ICD-10-CM

## 2016-02-15 DIAGNOSIS — C154 Malignant neoplasm of middle third of esophagus: Secondary | ICD-10-CM | POA: Diagnosis not present

## 2016-02-15 MED ORDER — SODIUM CHLORIDE 0.9 % IV SOLN
Freq: Once | INTRAVENOUS | Status: AC
  Administered 2016-02-15: 17:00:00 via INTRAVENOUS

## 2016-02-15 MED ORDER — MIRTAZAPINE 15 MG PO TABS: 15.0000 mg | ORAL_TABLET | Freq: Every day | ORAL | 1 refills | Status: DC

## 2016-02-15 NOTE — Progress Notes (Signed)
Patient presents to nutrition follow-up with his wife. Reports nasojejunal feeding tube became clogged last evening. He was unable to flush water or receive his tube feedings last night. Patient also noted he did not receive IV fluids yesterday from [facility] due to infiltration of his IV. Patient is frustrated with nasojejunal tube and is requesting a different kind of tube (surgical jejunostomy tube would be an option.)  Nutrition diagnosis: Unintentional weight loss improved.  Estimated nutrition needs: 1820-2135 calories, 75-90 grams protein, greater than 2 L fluid daily.  Intervention: Spoke with dietitian at [facility] and she reports they will supply patient with tube feeding formula using Nutren 1.5.  Patient is asked to go and pick this up from them. Patient will continue to use goal rate of 96 mL an hour over 15 hours with free water flushes of 60 cc every hour once feeding tube is unclogged or replaced. Will continue to monitor for refeeding syndrome.  Labs are being drawn by [facility] so physician is reviewing. Questions were answered and teach back method used.  Monitoring, evaluation, goals: Patient will tolerate tube feedings to meet greater than 90% estimated nutrition needs.  Next visit: To be scheduled.  **Disclaimer: This note was dictated with voice recognition software. Similar sounding words can inadvertently be transcribed and this note may contain transcription errors which may not have been corrected upon publication of note.**

## 2016-02-15 NOTE — Telephone Encounter (Signed)
Wife called to report his feeding tube stopped up last night-could not flush it. On call MD was notified and was told OK to wait till appointment today with Dr. Burr Medico. She also reports that he did not get IV fluids yesterday-the IV infiltrated. Asking RN what to do?  Instructed her to keep his appointments as scheduled for today. He already has opening for IV fluids today and we can work on getting the tube replaced if nurse here is not able to unclog. Called [facility] and requested labs to be faxed that were drawn yesterday.

## 2016-02-15 NOTE — Patient Instructions (Signed)

## 2016-02-15 NOTE — Progress Notes (Signed)
[place]  Telephone:([phone] Fax:([phone]) [phone]  Clinic Follow up Note   Patient Care Team: [name], MD as PCP - General (Internal Medicine) Truitt Merle, MD as Consulting Physician (Hematology) Kyung Rudd, MD as Consulting Physician (Radiation Oncology) [name], MD as Consulting Physician (Cardiothoracic Surgery) Karie Mainland, RD as Dietitian (Nutrition)   CHIEF COMPLAINTS:  Follow up esophageal adenocarcinoma  Oncology History   Presented with solid dysphagia at least daily with chest pain. Involuntary weight loss of 10-12 lb over past year  Malignant neoplasm of lower third of esophagus (HCC)   Staging form: Esophagus - Adenocarcinoma, AJCC 7th Edition     Clinical stage from 12/15/2015: Stage IIIA (T3, N1, M0) - Signed by Truitt Merle, MD on 12/24/2015       Malignant neoplasm of lower third of esophagus ([place])   11/23/2015 Procedure    EGD per [facility](Dr. [place]):6cm mass in lower third of esophagus     11/24/2015 Initial Diagnosis    Esophageal cancer ([place])     11/24/2015 Pathology Results    Mucinous adenocarcinoma, moderately differentiated-Her2 analysis pending     12/05/2015 Imaging    PET scan showed hypermetabolic a mass in distal esophagus, hypermetabolic activity in the left anterior prostate gland, metabolic density in left adrenal nodule, CT attenuation suggest a benign adrenal adenoma. Left apical 2 mm lung nodule.     12/15/2015 Procedure    EUS showed a uT3N1 lesion from 27cm to 35cm from the incisors      12/26/2015 -  Chemotherapy    Weekly carboplatin AUC 2 and Taxol 45 mg/m, with concurrent radiation     12/26/2015 -  Radiation Therapy    Neoadjuvant chemoradiation to the esophageal cancer      HISTORY OF PRESENTING ILLNESS (12/14/2015):  Fred [name] 75 y.o. male is here because of His newly diagnosed esophageal cancer. He is accompanied by his wife and daughter to my clinic today.  He has had  dysphagia for 2 months, mainly with solid food, no dysphagia with soft food or liquid. He also has mild pain in mid chest when he swallows. No nausea, abdominal pain, or other discomfort. He has lost about 15 lbs in the last year, no other symoptoms. He was seen by his primary care physician at [facility], and referred to gastroenterologist Dr. Alice Reichert. He underwent EGD on 11/23/2015, which showed a 6 cm mass in the lower third of esophagus. Biopsy showed adenocarcinoma, moderately differentiated. He was referred to [place] to consider neoadjuvant therapy. He is scheduled to see a Psychologist, sport and exercise at [facility] later this week.  He has had some urinary frequency, underwent TURP on 11/02/2015.   CURRENT THERAPY: supportive care, recovering from chemo and radiation  INTERIM HISTORY: Fred [name] returns for follow up. His NJ tube feeding has been going well since last week, he usually get 15 hours feeding with water every hour through the pump from 5pm to 8am. He can not lay flat when he get feeding so he has been sleeping in a recliner lately. Unfortunately his NJ tube got clotted last night and he did not have any feeds last night. He really does not like the NJ tube, very frustrated and even depressed. Per his wife, he has lost interests on activities, even does not want to watch TV. Pt wants to know if he can have a J tube placed and remove his NJ tube. He has not eating or drinking by mouth yet, except  medicines. He still has intermittent nausea, fatigued,no dizziness. He, however, has gained about 8 lbs in the past week. No fever or chills.    MEDICAL HISTORY:  Past Medical History:  Diagnosis Date  . Esophageal cancer ([place]) 11/23/15   lower 3rd esohagus   . GERD (gastroesophageal reflux disease)   . Hypertension     SURGICAL HISTORY: Past Surgical History:  Procedure Laterality Date  . cystoscope  4/12/1   prostate  . PROSTATE BIOPSY  10/05/15  . right shoulder surgery      SOCIAL HISTORY: Social  History   Social History  . Marital status: Married    Spouse name: N/A  . Number of children: N/A  . Years of education: N/A   Occupational History  . Not on file.   Social History Main Topics  . Smoking status: Former Smoker    Packs/day: 1.00    Years: 10.00    Quit date: 07/24/1967  . Smokeless tobacco: Not on file  . Alcohol use No  . Drug use: No  . Sexual activity: Not Currently   Other Topics Concern  . Not on file   Social History Narrative  . No narrative on file    FAMILY HISTORY: Family History  Problem Relation Age of Onset  . Cancer Maternal Grandfather 32    gastric cancer     ALLERGIES:  has No Known Allergies.  MEDICATIONS:  Current Outpatient Prescriptions  Medication Sig Dispense Refill  . acetaminophen (TYLENOL) 500 MG tablet Take 500 mg by mouth every 6 (six) hours as needed. Reported on 02/08/2016    . atenolol (TENORMIN) 25 MG tablet Take 25 mg by mouth daily.    Marland Kitchen nystatin (MYCOSTATIN) 100000 UNIT/ML suspension Take 5 mLs (500,000 Units total) by mouth 4 (four) times daily. 100 mL 1  . omeprazole (PRILOSEC) 20 MG capsule Take 20 mg by mouth daily. Reported on 02/08/2016    . oxyCODONE (OXY IR/ROXICODONE) 5 MG immediate release tablet Take 1-2 tablets (5-10 mg total) by mouth every 4 (four) hours as needed for severe pain. 100 tablet 0  . polyethylene glycol (MIRALAX / GLYCOLAX) packet Take 17 g by mouth daily as needed. Reported on 02/08/2016    . simvastatin (ZOCOR) 20 MG tablet Take 20 mg by mouth daily at 6 PM. 1/2 tablet    . sucralfate (CARAFATE) 1 GM/10ML suspension Take 10 mLs (1 g total) by mouth 4 (four) times daily -  with meals and at bedtime. 420 mL 0  . mirtazapine (REMERON) 15 MG tablet Take 1 tablet (15 mg total) by mouth at bedtime. 30 tablet 1  . ondansetron (ZOFRAN) 8 MG tablet Take 1 tablet (8 mg total) by mouth every 8 (eight) hours as needed for nausea. (Patient not taking: Reported on 02/08/2016) 30 tablet 3  . prochlorperazine  (COMPAZINE) 10 MG tablet Take 1 tablet (10 mg total) by mouth every 6 (six) hours as needed. (Patient not taking: Reported on 02/08/2016) 30 tablet 3  . UNABLE TO FIND Inject 1,000 mLs into the vein as directed. Please give 1L  Normal Saline over 2 hours IV on   7/20,  7/21,  7/24. (Patient not taking: Reported on 02/15/2016) 3000 mL 1   No current facility-administered medications for this visit.    Facility-Administered Medications Ordered in Other Visits  Medication Dose Route Frequency Provider Last Rate Last Dose  . 0.9 %  sodium chloride infusion   Intravenous Once Truitt Merle, MD  REVIEW OF SYSTEMS:   Constitutional: Denies fevers, chills or abnormal night sweats, very fatigued, (+) weight loss  Eyes: Denies blurriness of vision, double vision or watery eyes Ears, nose, mouth, throat, and face: Denies mucositis or sore throat Respiratory: Denies cough, dyspnea or wheezes Cardiovascular: Denies palpitation, chest discomfort or lower extremity swelling Gastrointestinal:  Denies nausea, heartburn or change in bowel habits Skin: Denies abnormal skin rashes Lymphatics: Denies new lymphadenopathy or easy bruising Neurological:Denies numbness, tingling or new weaknesses Behavioral/Psych: Mood is stable, no new changes  All other systems were reviewed with the patient and are negative.  PHYSICAL EXAMINATION: ECOG PERFORMANCE STATUS: 3  Vitals:   02/15/16 1426  BP: 130/64  Pulse: (!) 126  Resp: 18  Temp: 97.9 F (36.6 C)   Filed Weights   02/15/16 1426  Weight: 129 lb (58.5 kg)    GENERAL:alert, no distress and comfortable SKIN: skin color, texture, turgor are normal, no rashes or significant lesions EYES: normal, conjunctiva are pink and non-injected, sclera clear OROPHARYNX:no exudate, no erythema and lips, buccal mucosa, and tongue normal  NECK: supple, thyroid normal size, non-tender, without nodularity LYMPH:  no palpable lymphadenopathy in the cervical, axillary or  inguinal LUNGS: clear to auscultation and percussion with normal breathing effort HEART: regular rate & rhythm and no murmurs and no lower extremity edema ABDOMEN:abdomen soft, non-tender and normal bowel sounds Musculoskeletal:no cyanosis of digits and no clubbing  PSYCH: alert & oriented x 3 with fluent speech NEURO: no focal motor/sensory deficits  LABORATORY DATA:  I have reviewed the data as listed CBC Latest Ref Rng & Units 01/30/2016 01/23/2016 01/16/2016  WBC 4.0 - 10.3 10e3/uL 4.1 4.8 4.7  Hemoglobin 13.0 - 17.1 g/dL 13.7 14.6 14.5  Hematocrit 38.4 - 49.9 % 39.1 41.5 42.4  Platelets 140 - 400 10e3/uL 129(L) 144 148   CMP Latest Ref Rng & Units 01/30/2016 01/23/2016 01/16/2016  Glucose 70 - 140 mg/dl 148(H) 136 124  BUN 7.0 - 26.0 mg/dL 17.0 14.3 15.8  Creatinine 0.7 - 1.3 mg/dL 0.8 0.8 0.7  Sodium 136 - 145 mEq/L 139 139 141  Potassium 3.5 - 5.1 mEq/L 4.0 4.1 4.2  CO2 22 - 29 mEq/L 26 25 28   Calcium 8.4 - 10.4 mg/dL 9.3 9.3 9.4  Total Protein 6.4 - 8.3 g/dL 6.2(L) 6.3(L) 6.4  Total Bilirubin 0.20 - 1.20 mg/dL 0.68 0.55 0.53  Alkaline Phos 40 - 150 U/L 88 83 69  AST 5 - 34 U/L 19 16 15   ALT 0 - 55 U/L 15 15 13    His lab from yesterday (done by home service): CBC: WBC 5.0, hemoglobin 10.6, platelet 141K CMP: Within normal limits except total protein 4.2, albumin 2.3, calcium 7.5   Pathology report Diagnosis 11/23/2015 Mass, lower third of the esophagus, mucosal biopsy Mucinous adenocarcinoma, moderately differentiated.  HER 2 (-)   RADIOGRAPHIC STUDIES: I have personally reviewed the radiological images as listed and agreed with the findings in the report. Dg Abd 1 View  Result Date: 02/08/2016 CLINICAL DATA:  Nasogastric tube placement.  Esophageal carcinoma. EXAM: ABDOMEN - 1 VIEW COMPARISON:  February 04, 2016 FINDINGS: Nasogastric tube tip is at the level of the proximal most aspect of the jejunum. The side port is in the proximal fourth portion of the duodenum. There is  moderate stool in the colon. The bowel gas pattern is unremarkable. No bowel dilatation or air-fluid level to suggest obstruction. No free air. Lung bases are clear. IMPRESSION: Nasogastric tube tip is at the  origin of the jejunum with the side port in the fourth portion of the duodenum. If this tube is to be used for feeding, its position is excellent for that purpose. Bowel gas pattern unremarkable. Moderate stool in colon. Lung bases clear. Electronically Signed   By: Lowella Grip III M.D.   On: 02/08/2016 15:10   Dg Addison Bailey G Tube Plc W/fl W/rad  Result Date: 02/04/2016 INDICATION: Malignant neoplasm involving the lower third of the esophagus post chemo and radiation. Please place post gastric nasogastric tube for enteric nutrition supplementation. EXAM: NASO G TUBE PLACEMENT WITH FL AND WITH RAD COMPARISON:  PET-CT - 12/05/2015 FLUOROSCOPY TIME:  6 minutes, 48 seconds COMPLICATIONS: None immediate PROCEDURE: The nasogastric tube was lubricated with viscous lidocaine inserted into the right nostril. Under intermittent fluoroscopic guidance, the Dobhoff tube was advanced through the stomach, through the duodenum with tip ultimately terminating over the expected location of the duodenal - jejunal junction. Contrast was injected via the nasogastric tube and a spot fluoroscopic image was saved for documentation purposes. The tube was affixed to the patient's nose with tape. The patient tolerated the procedure well without immediate postprocedural complication. FINDINGS: Contrast injection demonstrates opacification of the distal aspect of the duodenum. IMPRESSION: Successful fluoroscopic guided placement of Dobhoff tube with tip terminating over the duodenojejunal junction. The tube is ready for immediate use. Electronically Signed   By: Sandi Mariscal M.D.   On: 02/04/2016 15:32   EGD by Dr. Alice Reichert 12/20/2015 Impression: -A 6 cm mass was found in the lower third of the esophagus. Multiple biopsies were  performed. -A sliding small sized hiatal hernia otherwise normal mucosa in stomach into duodenum.  PET scan 12/05/2015 ([facility]) Impression #1 distal esophagus which she'll hypermetabolic mass consistent with known malignancy #2 hypermetabolic activity in the left anterior prostate gland, correlate for malignancy #3 metabolic density left adrenal nodule. CT attenuation suggest a benign adrenal adenoma which can be moderately hypermetabolic. However given history of malignancy, metastatic disease is not excluded. #4 left apical 2 mm pulmonary nodule, too small for evaluation by PET.  EUS at [facility] 12/14/2015: Impressions: Mass was noted from 27 cm to 35 cm from the in sisters. By EUS criteria and the lesion was T3, there was 1-2 metastatic regional lymph nodes.  ASSESSMENT & PLAN:  75 year old male presented with dysphagia with solid food  1. Distal esophageal adenocarcinoma, [id number] -I reviewed his CT scan, PET scan, EGD, and the biopsy findings with patient and his family member in details. -The PET scan showed no definitive distant metastasis. 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 This will be followed in the future scan. -By EUS, he has T3 N1 stage III disease. -He completed neoadjuvant radiation with weekly Carbo and Taxol. -He has developed moderate to severe dysphagia and odynophagia, not able to take oral nutrition, has had NJ-tube placed on 02/04/2016, today's x-ray showed is in good position. -We'll continue tube feeding, this will be managed by our dietitian Pamala Hurry, she saw patient today also  -we contacted radiology and pt was seen after his visit with me and had his tube clog issue resolved   -I will give him 1L NS at our infusion today  -Home care were to lab weekly on Tuesday and set up IV fluids every Tuesday and Friday for next two weeks  -Pt wishes to have a J tube placed to replaced his NJ tube, however  this will require him to see  a general surgeon, and a few days hospital stay after the procedure. I spoke with Dr. Servando Snare about this today. Pt will think about it. I encourage to keep the [place] tube for a few more weeks, and hope he can start eating again soon  -lab results reviewed with patient and his wife  2. HTN -his BP is better today, normal -continue low dose atenolol   3. Odynophagia and Nausea -He knows to take the Zofran and Compazine as needed for nausea -He will continue oxycodone  4. Malnutrition and weight loss -He has lost an additional 10-15 pounds since he started chemotherapy and radiation, but gained 8lbs back last week  -He is on tube feeds now, will be managed by our dietitian Pamala Hurry. I strongly encourage him to continue his NJ tube feeding for now   5. Depression  -I recommend him to start Mirtazapine 59m at bed time, for depression and anorexia, he agrees.   Plan -NJ tube clog issue resolved by radiology today  -NS 1L today here -repeat lab CBC, CMP, PHOS and MAGNESIUM weekly on Tuesday and IVF 1L NS  On Tuesday and Friday by home care service -I called in Mirtazapine for him today   -He will see our APP next week and 'll see him back in 2 week with IV fluids appointment  All questions were answered. The patient knows to call the clinic with any problems, questions or concerns.  I spent 20 minutes counseling the patient face to face. The total time spent in the appointment was 25 minutes and more than 50% was on counseling.     FTruitt Merle MD 02/15/2016

## 2016-02-16 ENCOUNTER — Other Ambulatory Visit: Payer: Self-pay | Admitting: *Deleted

## 2016-02-16 ENCOUNTER — Ambulatory Visit (HOSPITAL_COMMUNITY): Payer: No Typology Code available for payment source

## 2016-02-16 DIAGNOSIS — C155 Malignant neoplasm of lower third of esophagus: Secondary | ICD-10-CM

## 2016-02-16 MED ORDER — UNABLE TO FIND: 1000.0000 mL | 10 refills | Status: DC

## 2016-02-20 ENCOUNTER — Other Ambulatory Visit: Payer: Self-pay | Admitting: *Deleted

## 2016-02-20 ENCOUNTER — Telehealth: Payer: Self-pay | Admitting: Nutrition

## 2016-02-20 NOTE — Telephone Encounter (Signed)
Received phone call from patient's wife. Patient had 2 episodes vomiting on Friday and Sunday. Wife reports it was yellow-green in color. Wife has slowed the tube feeding down to 80 mL/hr. Encouraged patient to resume nausea medication since he has not been taking it. Encouraged patient's wife to contact cancer center for schedule clarification as she had questions.

## 2016-02-21 ENCOUNTER — Other Ambulatory Visit: Payer: Self-pay | Admitting: *Deleted

## 2016-02-21 ENCOUNTER — Telehealth: Payer: Self-pay | Admitting: Hematology

## 2016-02-21 ENCOUNTER — Ambulatory Visit: Payer: No Typology Code available for payment source

## 2016-02-21 DIAGNOSIS — C155 Malignant neoplasm of lower third of esophagus: Secondary | ICD-10-CM

## 2016-02-21 MED ORDER — OXYCODONE HCL 5 MG PO TABS: 5.0000 mg | ORAL_TABLET | ORAL | 0 refills | Status: DC | PRN

## 2016-02-21 NOTE — Telephone Encounter (Signed)
Pt's wife called for refill on pt's pain med.  Returned call & informed that pt would have pick up script from office to take to pharmacy.  Informed to bring license or ID with her. Also informed of appt for fri for IVF & Ned Card NP. She mentions that pt just threw up & she thinks it is his tube feeding that is coming up.  Asked her to monitor amount & color & how often & report to Ned Card NP on Fri & we can also discuss with Dory [place] Dietician.  She was in agreement.

## 2016-02-21 NOTE — Telephone Encounter (Signed)
per pof to sch pt appt-per Janifer Adie she will call pt and adv of appt time & date for 8/4 and will check w/Lisa after that appt to see if 8/9 needs to stay or be moved

## 2016-02-22 ENCOUNTER — Other Ambulatory Visit: Payer: Self-pay | Admitting: *Deleted

## 2016-02-22 ENCOUNTER — Ambulatory Visit: Payer: No Typology Code available for payment source

## 2016-02-22 DIAGNOSIS — C155 Malignant neoplasm of lower third of esophagus: Secondary | ICD-10-CM

## 2016-02-22 NOTE — Progress Notes (Signed)
Patient had lab work faxed to [place] today from [facility].  Reviewed results with Dr. Benay Spice.   Repeat BMET on Friday 02/24/16 when patient comes to IVF and to see Ned Card NP.   Lab work to HIM to be scanned.

## 2016-02-23 ENCOUNTER — Ambulatory Visit
Admission: RE | Admit: 2016-02-23 | Discharge: 2016-02-23 | Disposition: A | Discharge status: Home / Self Care | Payer: No Typology Code available for payment source | Source: Ambulatory Visit | Attending: Cardiothoracic Surgery | Admitting: Cardiothoracic Surgery

## 2016-02-23 ENCOUNTER — Ambulatory Visit (INDEPENDENT_AMBULATORY_CARE_PROVIDER_SITE_OTHER): Payer: No Typology Code available for payment source | Admitting: Cardiothoracic Surgery

## 2016-02-23 ENCOUNTER — Encounter: Payer: Self-pay | Admitting: Cardiothoracic Surgery

## 2016-02-23 ENCOUNTER — Other Ambulatory Visit: Payer: Self-pay | Admitting: *Deleted

## 2016-02-23 VITALS — BP 129/73 | HR 130 | Resp 20 | Ht 65.0 in | Wt 123.0 lb

## 2016-02-23 DIAGNOSIS — C155 Malignant neoplasm of lower third of esophagus: Secondary | ICD-10-CM

## 2016-02-23 NOTE — Progress Notes (Signed)
[address]       [place] [address]             [phone]                    Fred [name] Medical Record #[id number] Date of Birth: Apr 01, 1941  Referring: Everrett Coombe, MD Primary Care: [name], MD  Chief Complaint:    Chief Complaint  Patient presents with  . Esophageal Cancer    3 week f/u further discuss surgery     History of Present Illness:    Fred [name] 75 y.o. male was originally seen in the office at his request for a second surgical opinion concerning his recently diagnosed adenocarcinoma the distal esophagus. The patient gives a history of many years of reflux symptoms taking acid blocking medications. For the past 3 months he had noted increasing difficulty swallowing specially solid food and bread. He was referred by the [facility] to [place] were upper GI endoscopy was performed a near obstructing mass 6 cm lower third of the esophagus was noted. 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 By EUS criteria the lesion was T3 with breaks into the muscularis propria total thickness 9 mm, there was present regional lymph nodes, staged N1. . The patient Completed radiation and chemotherapy at [facility] .    In spite of therapy the patient has continued to lose weight, getting to him 3 times a week IV fluid resuscitation.  He continues to lose weight from 140 down to 124 pounds over the past 3 weeks., When seen several weeks ago he was scheduled for placement of a feeding NG tube and starting on tube feedings, in spite of this is continued to lose an additional 5 pounds. He's had time at times trouble tolerating the tube feedings  with nausea and requiring a tube feeding rate to be decreased.   There is a family history of gastric cancer in his maternal grandfather, patient's father died of a stroke in his 71s mother died in her 31s of Alzheimer.   Patient notes that when his symptoms first began he had chest discomfort and was referred to the [facility] ER from the [facility], he notes a stress test and echocardiogram were done at the [place] per his report he was told he had no cardiac issues- his wife will assist in obtaining these records  Current Activity/ Functional Status:  Patient is independent with mobility/ambulation, transfers, ADL's, IADL's.   Zubrod Score: At the time of surgery this patient's most appropriate activity status/level should be described as: [x]     0    Normal activity, no symptoms []     1    Restricted in physical strenuous activity but ambulatory, able to do out light work []     2    Ambulatory and capable of self care, unable to do work activities, up and about               >50 % of waking hours                              []   3    Only limited self care, in bed greater than 50% of waking hours []     4    Completely disabled, no self care, confined to bed or chair []     5    Moribund   Past Medical History:  Diagnosis Date  . Esophageal cancer ([place]) 11/23/15   lower 3rd esohagus   . GERD (gastroesophageal reflux disease)   . Hypertension     Past Surgical History:  Procedure Laterality Date  . cystoscope  4/12/1   prostate  . PROSTATE BIOPSY  10/05/15  . right shoulder surgery      Family History  Problem Relation Age of Onset  . Cancer Maternal Grandfather 36    gastric cancer    Stomach cancer Maternal Grandfather   Social History   Social History  . Marital status: Married    Spouse name: N/A  . Number of children: N/A  . Years of education: N/A   Occupational History  . Not on file.   Social History Main Topics  . Smoking status: Former Smoker    Packs/day:  1.00    Years: 10.00    Quit date: 07/24/1967  . Smokeless tobacco: Never Used  . Alcohol use No  . Drug use: No  . Sexual activity: Not Currently   Other Topics Concern  . Not on file   Social History Narrative  . No narrative on file    History  Smoking Status  . Former Smoker  . Packs/day: 1.00  . Years: 10.00  . Quit date: 07/24/1967  Smokeless Tobacco  . Never Used    History  Alcohol Use No     No Known Allergies  Current Outpatient Prescriptions  Medication Sig Dispense Refill  . atenolol (TENORMIN) 25 MG tablet Take 25 mg by mouth daily.    . mirtazapine (REMERON) 15 MG tablet Take 1 tablet (15 mg total) by mouth at bedtime. 30 tablet 1  . nystatin (MYCOSTATIN) 100000 UNIT/ML suspension Take 5 mLs (500,000 Units total) by mouth 4 (four) times daily. 100 mL 1  . omeprazole (PRILOSEC) 20 MG capsule Take 20 mg by mouth daily. Reported on 02/08/2016    . ondansetron (ZOFRAN) 8 MG tablet Take 1 tablet (8 mg total) by mouth every 8 (eight) hours as needed for nausea. 30 tablet 3  . oxyCODONE (OXY IR/ROXICODONE) 5 MG immediate release tablet Take 1-2 tablets (5-10 mg total) by mouth every 4 (four) hours as needed for severe pain. 100 tablet 0  . polyethylene glycol (MIRALAX / GLYCOLAX) packet Take 17 g by mouth daily as needed. Reported on 02/08/2016    . prochlorperazine (COMPAZINE) 10 MG tablet Take 1 tablet (10 mg total) by mouth every 6 (six) hours as needed. 30 tablet 3  . simvastatin (ZOCOR) 20 MG tablet Take 20 mg by mouth daily at 6 PM. 1/2 tablet    . UNABLE TO FIND Inject 1,000 mLs into the vein as directed. Please give 1L  Normal Saline over 2 hours IV on   7/28,  And  Every  Tuesday  Until  Further  Notice.  Please draw  CBC, CMET  Every  Tuesday  And  Fax  Results  To  Dr. Ernestina Penna  Office  -   Fax     [phone]. 1000 mL 10  . acetaminophen (TYLENOL) 500 MG tablet Take 500 mg by mouth every 6 (six) hours as needed. Reported on 02/08/2016    . sucralfate  (CARAFATE)  1 GM/10ML suspension Take 10 mLs (1 g total) by mouth 4 (four) times daily -  with meals and at bedtime. (Patient not taking: Reported on 02/23/2016) 420 mL 0   No current facility-administered medications for this visit.    Facility-Administered Medications Ordered in Other Visits  Medication Dose Route Frequency Provider Last Rate Last Dose  . 0.9 %  sodium chloride infusion   Intravenous Once Truitt Merle, MD          Review of Systems:     Cardiac Review of Systems: Y or N  Chest Pain [y evaluated at [place] last month ]  Resting SOB [ n  ] Exertional SOB  [n  ]  Orthopnea [ n ]   Pedal Edema [ n  ]    Palpitations [ n ] Syncope  [n  ]   Presyncope [ n  ]  General Review of Systems: [Y] = yes [  ]=no Constitional: recent weight change [  ];  Wt loss over the last 3 months [   ] anorexia [  ]; fatigue [  ]; nausea [  ]; night sweats [  ]; fever [  ]; or chills [  ];          Dental: poor dentition[  ]; Last Dentist visit:   Eye : blurred vision [  ]; diplopia [   ]; vision changes [  ];  Amaurosis fugax[  ]; Resp: cough [  ];  wheezing[  ];  hemoptysis[  ]; shortness of breath[  ]; paroxysmal nocturnal dyspnea[  ]; dyspnea on exertion[  ]; or orthopnea[  ];  GI:  gallstones[  ], vomiting[  ];  dysphagia[ y ]; [place] Muta  ];  hematochezia [  ]; heartburn[  ];   Hx of  Colonoscopy[ y ]; GU: kidney stones [  ]; hematuria[  ];   dysuria [  ];  nocturia[  ];  history of     obstruction [  ]; urinary frequency Blue.Reese  ]             Skin: rash, swelling[  ];, hair loss[  ];  peripheral edema[  ];  or itching[  ]; Musculosketetal: myalgias[  ];  joint swelling[  ];  joint erythema[  ];  joint pain[  ];  back pain[  ];  Heme/Lymph: bruising[  ];  bleeding[  ];  anemia[  ];  Neuro: TIA[  ];  headaches[  ];  stroke[  ];  vertigo[  ];  seizures[  ];   paresthesias[  ];  difficulty walking[chronic injury to left leg  ];  Psych:depression[n  ]; anxiety[ n ];  Endocrine: diabetes[  ];  thyroid  dysfunction[  ];  Immunizations: Flu up to date [ y]; Pneumococcal up to date [  y];  Other:  Physical Exam: BP 129/73 (BP Location: Right Arm, Patient Position: Sitting, Cuff Size: Small)   Pulse (!) 130   Resp 20   Ht 5' 5"  (1.651 m)   Wt 123 lb (55.8 kg)   SpO2 98% Comment: RA  BMI 20.47 kg/m   Wt Readings from Last 3 Encounters:  02/23/16 123 lb (55.8 kg)  02/15/16 129 lb (58.5 kg)  02/15/16 128 lb 9.6 oz (58.3 kg)   PHYSICAL EXAMINATION: General appearance: alert, cooperative and no distress, NG tube is in place Head: Normocephalic, without obvious abnormality, atraumatic Neck: no adenopathy, no carotid bruit, no JVD, supple, symmetrical, trachea midline and thyroid not enlarged,  symmetric, no tenderness/mass/nodules Lymph nodes: Cervical, supraclavicular, and axillary nodes normal. Resp: clear to auscultation bilaterally Back: symmetric, no curvature. ROM normal. No CVA tenderness. Cardio: regular rate and rhythm, S1, S2 normal, no murmur, click, rub or gallop GI: soft, non-tender; bowel sounds normal; no masses,  no organomegaly Extremities: extremities normal, atraumatic, no cyanosis or edema Neurologic: Grossly normal  Diagnostic Studies & Laboratory data:   Dg Abd 1 View  Result Date: 02/23/2016 CLINICAL DATA:  Evaluate feeding tube placement, history of esophageal carcinoma EXAM: ABDOMEN - 1 VIEW COMPARISON:  Abdomen film of 02/08/2016 FINDINGS: The NG tube does not appear to be changed in position. The tube probably courses into the duodenum extending into the proximal jejunum within the left upper quadrant. The bowel gas pattern is nonspecific the lung bases appear clear. IMPRESSION: NG tube tip is within the left upper quadrant presumably within proximal jejunum. Electronically Signed   By: Ivar Drape M.D.   On: 02/23/2016 14:38   Recent Lab Findings: Lab Results  Component Value Date   WBC 4.1 01/30/2016   HGB 13.7 01/30/2016   HCT 39.1 01/30/2016   PLT 129  (L) 01/30/2016   GLUCOSE 148 (H) 01/30/2016   ALT 15 01/30/2016   AST 19 01/30/2016   NA 139 01/30/2016   K 4.0 01/30/2016   CREATININE 0.8 01/30/2016   BUN 17.0 01/30/2016   CO2 26 01/30/2016   CT A/P with contrast 12/01/2015 Marked circumferential mid to distal esophageal wall thickening, no enlarged thoracic nodes or evidence of lung mets Small varices along the GE junction Periportal nodes measuring less than a centimeter. There is prominent upper abdominal node adjacent to the splenic vein/SMV confluence measuring 15x46m 13 mm left adrenal indeterminate nodule. Hypodense scattered liver lesions, the largest measuring 8 mm in the right, too small to characterize Enlarged heterogeneously enhancing prostate with ill-defined margins  12/15/2015 EGD/EUS 12/15/2015 Mass noted at 27CM from the incisors, by EUS criteria the lesion is T3 with breaks in the muscularis propria, total thickness 9 mm. Based on the presene of 1-2 metastatic regional lymph nodes, N staging was N1. The radial EUS would not pass through and did not dilate given clinical information already gleaned.  PATHOLOGY: Lower third of esophagus mass biopsy 11/23/2015 Mucinous adenocarcinoma, moderately differentiated.   PET:  IMPRESSION:  1.  Distal esophageal hypermetabolic mass consistent with known malignancy. 2.  Hypermetabolic activity in the left anterior prostate gland, correlate for malignancy. 3.  Metabolic density left adrenal nodule. 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 However, given history of malignancy, metastatic disease is not excluded. 4.  Left apical 2 mm pulmonary nodule, too small for evaluation by PET.   Result Narrative  INDICATION: C15.5: Malignant neoplasm of lower third of esophagus  TECHNIQUE: 7.1 millicuries of [id number] was administered intravenously. PET imaging was obtained from the skull base to the mid thighs. CT images were obtained for  attenuation correction and localization purposes. Glucose level was 103 MG/DL. Time from  injection to scan was 62 minutes.  Comparison none.  FINDINGS:  Hypermetabolic distal esophageal mass max SUV 8.5 (image 118) consistent with known malignancy.  Hypermetabolic activity within the left anterior prostate Max SUV 19.8, no mass identified by CT.  Low density left adrenal nodule measuring 1.4 cm (image 146). 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  Remaining activity in the body is normal and physiologic. Additional findings: Left apical 2  mm pulmonary nodule (image 66) 2 small for evaluation by PET. Gallbladder sludge. No wall thickening. Bilateral renal cysts. Nonobstructing mid left renal stone. Colonic diverticulosis   I have independently reviewed the above radiology studies  and reviewed the findings with the patient.   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 Patient has continued weight loss and progressive malnutrition down additional 5 pounds in spite of tube feedings., in order to even consider surgical resection he needs to reach an anabolic state.  I plan to see the patient back in 3 weeks, he will need cardiology clearance and a repeat staging CT of the chest and abdomen before proceeding with surgery.  Follow-up abdominal scan was performed to confirm the placement of this NG feeding tube, before considering surgical resection the patient needs to reach an anabolic state, need to continue aggressive tube feeding therapy. Plan to see back in 2 weeks  [name] MD      [address] [place] [address] Office [phone]   Beeper [phone]  02/23/2016 12:44 PM

## 2016-02-24 ENCOUNTER — Other Ambulatory Visit (HOSPITAL_BASED_OUTPATIENT_CLINIC_OR_DEPARTMENT_OTHER): Payer: No Typology Code available for payment source

## 2016-02-24 ENCOUNTER — Ambulatory Visit (HOSPITAL_BASED_OUTPATIENT_CLINIC_OR_DEPARTMENT_OTHER): Payer: No Typology Code available for payment source

## 2016-02-24 ENCOUNTER — Ambulatory Visit (HOSPITAL_BASED_OUTPATIENT_CLINIC_OR_DEPARTMENT_OTHER): Payer: No Typology Code available for payment source | Admitting: Nurse Practitioner

## 2016-02-24 ENCOUNTER — Ambulatory Visit: Payer: No Typology Code available for payment source | Admitting: Nutrition

## 2016-02-24 VITALS — HR 88

## 2016-02-24 VITALS — BP 124/64 | HR 93 | Temp 98.3°F | Resp 16

## 2016-02-24 DIAGNOSIS — C155 Malignant neoplasm of lower third of esophagus: Secondary | ICD-10-CM | POA: Diagnosis not present

## 2016-02-24 DIAGNOSIS — F329 Major depressive disorder, single episode, unspecified: Secondary | ICD-10-CM | POA: Diagnosis not present

## 2016-02-24 DIAGNOSIS — E860 Dehydration: Secondary | ICD-10-CM

## 2016-02-24 DIAGNOSIS — I10 Essential (primary) hypertension: Secondary | ICD-10-CM

## 2016-02-24 DIAGNOSIS — C154 Malignant neoplasm of middle third of esophagus: Secondary | ICD-10-CM

## 2016-02-24 DIAGNOSIS — E46 Unspecified protein-calorie malnutrition: Secondary | ICD-10-CM

## 2016-02-24 LAB — CBC WITH DIFFERENTIAL/PLATELET
BASO%: 0.8 % (ref 0.0–2.0)
BASOS ABS: 0.1 10*3/uL (ref 0.0–0.1)
EOS ABS: 0 10*3/uL (ref 0.0–0.5)
EOS%: 0.6 % (ref 0.0–7.0)
HCT: 34.7 % — ABNORMAL LOW (ref 38.4–49.9)
HGB: 12 g/dL — ABNORMAL LOW (ref 13.0–17.1)
LYMPH%: 27.9 % (ref 14.0–49.0)
MCH: 31.2 pg (ref 27.2–33.4)
MCHC: 34.6 g/dL (ref 32.0–36.0)
MCV: 90.3 fL (ref 79.3–98.0)
MONO#: 0.7 10*3/uL (ref 0.1–0.9)
MONO%: 9.6 % (ref 0.0–14.0)
NEUT%: 61.1 % (ref 39.0–75.0)
NEUTROS ABS: 4.7 10*3/uL (ref 1.5–6.5)
PLATELETS: 266 10*3/uL (ref 140–400)
RBC: 3.85 10*6/uL — AB (ref 4.20–5.82)
RDW: 18.5 % — ABNORMAL HIGH (ref 11.0–14.6)
WBC: 7.6 10*3/uL (ref 4.0–10.3)
lymph#: 2.1 10*3/uL (ref 0.9–3.3)

## 2016-02-24 LAB — COMPREHENSIVE METABOLIC PANEL
ALK PHOS: 110 U/L (ref 40–150)
ALT: 19 U/L (ref 0–55)
ANION GAP: 9 meq/L (ref 3–11)
AST: 22 U/L (ref 5–34)
Albumin: 2.2 g/dL — ABNORMAL LOW (ref 3.5–5.0)
BILIRUBIN TOTAL: 0.35 mg/dL (ref 0.20–1.20)
BUN: 9.6 mg/dL (ref 7.0–26.0)
CO2: 26 meq/L (ref 22–29)
Calcium: 8.4 mg/dL (ref 8.4–10.4)
Chloride: 100 mEq/L (ref 98–109)
Creatinine: 0.5 mg/dL — ABNORMAL LOW (ref 0.7–1.3)
Glucose: 106 mg/dl (ref 70–140)
Potassium: 3.4 mEq/L — ABNORMAL LOW (ref 3.5–5.1)
Sodium: 135 mEq/L — ABNORMAL LOW (ref 136–145)
TOTAL PROTEIN: 5.5 g/dL — AB (ref 6.4–8.3)

## 2016-02-24 MED ORDER — SODIUM CHLORIDE 0.9 % IV SOLN
Freq: Once | INTRAVENOUS | Status: AC
  Administered 2016-02-24: 14:00:00 via INTRAVENOUS

## 2016-02-24 NOTE — Progress Notes (Signed)
Called patient and spoke to his wife.  She reports patient denies nausea.  He did not throw up yesterday or today.  Wife infusing Osmolite 1.5 at 70 mL/hr continuous via J-tube.  Weight decreased to 123 pounds on August 3rd (on a different scale). Reports he is only tolerating 4 - 5 cans daily. J-tube placement checked and is in the appropriate position for feeding.  Encouraged wife to run feeding as tolerated and try to infuse 6 cans daily. Pump is providing 60 cc water every hour and tube is flushed with 120 cc water before and after continuous feedings. Doubtful TF is causing nausea/vomiting as patient has tolerated this at goal rate. Will continue to work with patient and wife.

## 2016-02-24 NOTE — Patient Instructions (Signed)

## 2016-02-24 NOTE — Progress Notes (Signed)
[place] OFFICE PROGRESS NOTE   Diagnosis:  Esophageal adenocarcinoma Oncology History   Presented with solid dysphagia at least daily with chest pain. Involuntary weight loss of 10-12 lb over past year  Malignant neoplasm of lower third of esophagus (HCC)   Staging form: Esophagus - Adenocarcinoma, AJCC 7th Edition     Clinical stage from 12/15/2015: Stage IIIA (T3, N1, M0) - Signed by Truitt Merle, MD on 12/24/2015      Malignant neoplasm of lower third of esophagus ([place])   11/23/2015 Procedure    EGD per [facility](Dr. Toledo):6cm mass in lower third of esophagus     11/24/2015 Initial Diagnosis    Esophageal cancer ([place])     11/24/2015 Pathology Results    Mucinous adenocarcinoma, moderately differentiated-Her2 analysis pending     12/05/2015 Imaging    PET scan showed hypermetabolic a mass in distal esophagus, hypermetabolic activity in the left anterior prostate gland, metabolic density in left adrenal nodule, CT attenuation suggest a benign adrenal adenoma. Left apical 2 mm lung nodule.     12/15/2015 Procedure    EUS showed a uT3N1 lesion from 27cm to 35cm from the incisors      12/26/2015 -  Chemotherapy    Weekly carboplatin AUC 2 and Taxol 45 mg/m, with concurrent radiation     12/26/2015 -  Radiation Therapy    Neoadjuvant chemoradiation to the esophageal cancer    INTERVAL HISTORY:   Fred [name] returns as scheduled.He continues tube feedings. He notes improved tolerance with less nausea/vomiting when he runs the tube feeding at a slower rate. He reports minimal oral intake. He notes an alteration in taste. Bowels are moving. Energy level remains poor. He started a trial of Remeron following his last visit. He discontinued the Remeron due to concern that it was related to the nausea/vomiting. He plans to resume it.  Objective:  Vital signs in last 24 hours:  Pulse 88. Blood pressure 110/57, respirations 18,  temperature 98.5   Resp: Lungs clear bilaterally. Cardio: Regular rate and rhythm. GI: No hepatomegaly. Vascular: No leg edema. Neuro: Alert and oriented.   Lab Results:  Lab Results  Component Value Date   WBC 7.6 02/24/2016   HGB 12.0 (L) 02/24/2016   HCT 34.7 (L) 02/24/2016   MCV 90.3 02/24/2016   PLT 266 02/24/2016   NEUTROABS 4.7 02/24/2016    Imaging:  Dg Abd 1 View  Result Date: 02/23/2016 CLINICAL DATA:  Evaluate feeding tube placement, history of esophageal carcinoma EXAM: ABDOMEN - 1 VIEW COMPARISON:  Abdomen film of 02/08/2016 FINDINGS: The NG tube does not appear to be changed in position. The tube probably courses into the duodenum extending into the proximal jejunum within the left upper quadrant. The bowel gas pattern is nonspecific the lung bases appear clear. IMPRESSION: NG tube tip is within the left upper quadrant presumably within proximal jejunum. Electronically Signed   By: Ivar Drape M.D.   On: 02/23/2016 14:38    Medications: I have reviewed the patient's current medications.  Assessment/Plan: 1. Distal esophageal adenocarcinoma, [id number]. PET scan showed no definitive distant metastasis. Left adrenal gland mass probably a benign adenoma based on CT characteristic. By EUS he has T3 N1 stage III disease. Concurrent neoadjuvant radiation/chemotherapy initiated 12/26/2015; completed 02/02/2016. 2. Hypertension. 3. Malnutrition and weight loss. He continues tube feedings. 4. Depression. He will resume Remeron.   Disposition: [name] performance status remains poor. He will continue the tube feedings and IV fluids as  previously ordered by Dr. Burr Medico.   He saw Dr. Servando Snare yesterday with recommendations for continuation of aggressive tube feedings. Next appointment with Dr. Servando Snare 03/06/2016.  He will return for a follow-up visit here on 02/29/2016. He will contact the office in the interim with any problems.    Ned Card ANP/GNP-BC   02/24/2016    4:14 PM

## 2016-02-25 ENCOUNTER — Emergency Department (HOSPITAL_COMMUNITY)
Admission: EM | Admit: 2016-02-25 | Discharge: 2016-02-25 | Disposition: A | Discharge status: Home / Self Care | Payer: Medicare Other | Attending: Emergency Medicine | Admitting: Emergency Medicine

## 2016-02-25 ENCOUNTER — Encounter (HOSPITAL_COMMUNITY): Payer: Self-pay | Admitting: *Deleted

## 2016-02-25 DIAGNOSIS — Z8501 Personal history of malignant neoplasm of esophagus: Secondary | ICD-10-CM | POA: Diagnosis not present

## 2016-02-25 DIAGNOSIS — T85598A Other mechanical complication of other gastrointestinal prosthetic devices, implants and grafts, initial encounter: Secondary | ICD-10-CM

## 2016-02-25 DIAGNOSIS — I10 Essential (primary) hypertension: Secondary | ICD-10-CM | POA: Insufficient documentation

## 2016-02-25 DIAGNOSIS — Z87891 Personal history of nicotine dependence: Secondary | ICD-10-CM | POA: Diagnosis not present

## 2016-02-25 DIAGNOSIS — K9423 Gastrostomy malfunction: Secondary | ICD-10-CM | POA: Insufficient documentation

## 2016-02-25 DIAGNOSIS — Z79899 Other long term (current) drug therapy: Secondary | ICD-10-CM | POA: Insufficient documentation

## 2016-02-25 MED ORDER — DOCUSATE SODIUM 50 MG/5ML PO LIQD
50.0000 mg | Freq: Once | ORAL | Status: DC
  Filled 2016-02-25: qty 10

## 2016-02-25 NOTE — ED Notes (Signed)
See triage note.  Pt reports when same happened 2 weeks ago, pt was sent to radiology to have his tube feeding unblocked.

## 2016-02-25 NOTE — ED Notes (Signed)
Went in to explain to pt and family what the plan is.  Pt requested to speak to the EDP prior to administering the colace ordered.  Pt states "if you can't get a radiologist to do this, I can just go home eat soup and go back to see my oncologist Monday."  [place] EDP notified

## 2016-02-25 NOTE — ED Notes (Signed)
Bed: WA20 Expected date:  Expected time:  Means of arrival:  Comments: 

## 2016-02-25 NOTE — ED Notes (Signed)
Family at bedside.  Family is sitting in the waiting area.

## 2016-02-25 NOTE — ED Provider Notes (Signed)
[facility] DEPT Provider Note   CSN: [id number] Arrival date & time: 02/25/16  1036  First Provider Contact:  First MD Initiated Contact with Patient 02/25/16 1127        History   Chief Complaint Chief Complaint  Patient presents with  . Other    tube feeding blocked    HPI Fred [name] is a 75 y.o. male.  The history is provided by the patient.  CC: clogged NGT  Onset/Duration: 4 hours Timing: constant Location: NGT (transpyloric) Quality: n/a Severity: n/a Modifying Factors:  Improved by: nothing  Worsened by: nothing Associated Signs/Symptoms:  Pertinent (+): baseline sore throat from chemo and radiation.  Pertinent (-): CP, SOB, N/V/D, nose bleeds Context: tried to flush after overnight feeds w/o success. Tried coca cola, no help. Stated this happened several weeks ago and it was unclogged with a wire by radiology. Would like to attempt that this time if possible. Does not want it changed.        Past Medical History:  Diagnosis Date  . 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 GERD (gastroesophageal reflux disease)   . Hypertension     Patient Active Problem List   Diagnosis Date Noted  . Malnutrition ([place]) 02/08/2016  . HTN (hypertension) 01/23/2016  . 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 cystoscope  4/12/1   prostate  . PROSTATE BIOPSY  10/05/15  . right shoulder surgery         Home Medications    Prior to Admission medications   Medication Sig Start Date End Date Taking? Authorizing Provider  acetaminophen (TYLENOL) 500 MG tablet Take 500 mg by mouth every 6 (six) hours as needed for headache. Reported on 02/08/2016   Yes Historical Provider, MD  atenolol (TENORMIN) 25 MG tablet Take 12.5 mg by mouth daily.    Yes Historical Provider, MD  nystatin (MYCOSTATIN) 100000 UNIT/ML suspension Take 5 mLs (500,000 Units total) by mouth 4 (four)  times daily. 01/23/16  Yes Truitt Merle, MD  omeprazole (PRILOSEC) 20 MG capsule Take 20 mg by mouth daily as needed (heartburn). Reported on 02/08/2016   Yes Historical Provider, MD  oxyCODONE (OXY IR/ROXICODONE) 5 MG immediate release tablet Take 1-2 tablets (5-10 mg total) by mouth every 4 (four) hours as needed for severe pain. 02/21/16  Yes Susanne Borders, NP  polyethylene glycol (MIRALAX / GLYCOLAX) packet Take 17 g by mouth daily as needed for mild constipation. Reported on 02/08/2016   Yes Historical Provider, MD  UNABLE TO FIND Inject 1,000 mLs into the vein as directed. Please give 1L  Normal Saline over 2 hours IV on   7/28,  And  Every  Tuesday  Until  Further  Notice.  Please draw  CBC, CMET  Every  Tuesday  And  Fax  Results  To  Dr. Ernestina Penna  Office  -   Fax     [phone]. Patient taking differently: Inject 1,000 mLs into the vein as directed. Please give 1L  Normal Saline over 2 hours IV on   7/28,  And  Every  Tuesday  And Thursday Until  Further  Notice.  Please draw  CBC, CMET  Every  Tuesday  And  Fax  Results  To  Dr. Ernestina Penna  Office  -   Fax     [phone]. 02/16/16  Yes Truitt Merle, MD  mirtazapine (REMERON) 15 MG  tablet Take 1 tablet (15 mg total) by mouth at bedtime. Patient not taking: Reported on 02/25/2016 02/15/16   Truitt Merle, MD  ondansetron (ZOFRAN) 8 MG tablet Take 1 tablet (8 mg total) by mouth every 8 (eight) hours as needed for nausea. Patient not taking: Reported on 02/25/2016 12/22/15   Truitt Merle, MD  prochlorperazine (COMPAZINE) 10 MG tablet Take 1 tablet (10 mg total) by mouth every 6 (six) hours as needed. Patient not taking: Reported on 02/25/2016 12/22/15   Truitt Merle, MD  sucralfate (CARAFATE) 1 GM/10ML suspension Take 10 mLs (1 g total) by mouth 4 (four) times daily -  with meals and at bedtime. Patient not taking: Reported on 02/23/2016 01/23/16   Hayden Pedro, PA-C    Family History Family History  Problem Relation Age of Onset  . Cancer Maternal Grandfather 105     gastric cancer     Social History Social History  Substance Use Topics  . Smoking status: Former Smoker    Packs/day: 1.00    Years: 10.00    Quit date: 07/24/1967  . Smokeless tobacco: Never Used  . Alcohol use No     Allergies   Review of patient's allergies indicates no known allergies.   Review of Systems Review of Systems  Constitutional: Negative for appetite change, chills, fatigue and fever.  HENT: Negative for congestion, ear pain, facial swelling, mouth sores and sore throat.   Eyes: Negative for visual disturbance.  Respiratory: Negative for cough, chest tightness and shortness of breath.   Cardiovascular: Negative for chest pain and palpitations.  Gastrointestinal: Negative for abdominal pain, blood in stool, diarrhea, nausea and vomiting.  Genitourinary: Negative for decreased urine volume, difficulty urinating and frequency.  Musculoskeletal: Negative for back pain and neck stiffness.  Skin: Negative for rash.  Neurological: Negative for dizziness, weakness, light-headedness and headaches.     Physical Exam Updated Vital Signs BP 131/63 (BP Location: Left Arm)   Pulse 118   Temp 97.8 F (36.6 C) (Oral)   Resp 18   Ht 5\' 5"  (1.651 m)   Wt 123 lb (55.8 kg)   SpO2 98%   BMI 20.47 kg/m   Physical Exam  Constitutional: He is oriented to person, place, and time. He appears well-nourished. No distress.  HENT:  Head: Normocephalic and atraumatic.  Right Ear: External ear normal.  Left Ear: External ear normal.  NGT in right nare. Oropharynx without irritation.  Eyes: Pupils are equal, round, and reactive to light. Right eye exhibits no discharge. Left eye exhibits no discharge. No scleral icterus.  Neck: Normal range of motion. Neck supple.  Cardiovascular: Normal rate.  Exam reveals no gallop and no friction rub.   No murmur heard. Pulmonary/Chest: Effort normal and breath sounds normal. No stridor. No respiratory distress. He has no wheezes. He has no  rales. He exhibits no tenderness.  Abdominal: Soft. He exhibits no distension and no mass. There is no tenderness. There is no rebound and no guarding.  Musculoskeletal: He exhibits no edema or tenderness.  Neurological: He is alert and oriented to person, place, and time.  Skin: Skin is warm and dry. No rash noted. He is not diaphoretic. No erythema.     ED Treatments / Results  Labs (all labs ordered are listed, but only abnormal results are displayed) Labs Reviewed - No data to display  EKG  EKG Interpretation None       Radiology None  Procedures Procedures (including critical care time)  Medications  Ordered in ED Medications - No data to display   Initial Impression / Assessment and Plan / ED Course  I have reviewed the triage vital signs and the nursing notes.  Pertinent labs & imaging results that were available during my care of the patient were reviewed by me and considered in my medical decision making (see chart for details).  Clinical Course   Clogged feeding tube. Fluoroscopy unavailable on the weekends. Recommended Colace however patient declined stating that he wanted to go home and follow-up with his regular doctor on Monday.  No indication for additional workup at this time. Patient is safe for discharge with strict return precautions. He is to follow-up with his primary care doctor for further management of his NGT.  Final Clinical Impressions(s) / ED Diagnoses   Final diagnoses:  Feeding tube blocked, initial encounter   Disposition: Discharge  Condition: Good  I have discussed the results, Dx and Tx plan with the patient who expressed understanding and agree(s) with the plan. Discharge instructions discussed at great length. The patient was given strict return precautions who verbalized understanding of the instructions. No further questions at time of discharge.    Discharge Medication List as of 02/25/2016  1:58 PM      Follow  Up: [name], MD [place] [address] [phone]  Schedule an appointment as soon as possible for a visit on 02/27/2016 for further assistance with your clogged feeding tube.      Fatima Blank, MD 02/25/16 [phone]

## 2016-02-25 NOTE — ED Triage Notes (Signed)
Pt reports blocked tube feeding-same happened x 2 weeks ago.  States they came to the ED because it's the weekend and the cancer center is closed.

## 2016-02-27 ENCOUNTER — Telehealth: Payer: Self-pay | Admitting: *Deleted

## 2016-02-27 ENCOUNTER — Other Ambulatory Visit: Payer: Self-pay | Admitting: *Deleted

## 2016-02-27 ENCOUNTER — Encounter: Payer: Self-pay | Admitting: Nutrition

## 2016-02-27 ENCOUNTER — Ambulatory Visit (HOSPITAL_COMMUNITY)
Admission: RE | Admit: 2016-02-27 | Discharge: 2016-02-27 | Disposition: A | Discharge status: Home / Self Care | Payer: No Typology Code available for payment source | Source: Ambulatory Visit | Attending: Hematology | Admitting: Hematology

## 2016-02-27 DIAGNOSIS — Z978 Presence of other specified devices: Secondary | ICD-10-CM | POA: Insufficient documentation

## 2016-02-27 DIAGNOSIS — C155 Malignant neoplasm of lower third of esophagus: Secondary | ICD-10-CM | POA: Diagnosis not present

## 2016-02-27 MED ORDER — IOPAMIDOL (ISOVUE-300) INJECTION 61%
50.0000 mL | Freq: Once | INTRAVENOUS | Status: AC | PRN
  Administered 2016-02-27: 20 mL

## 2016-02-27 NOTE — Telephone Encounter (Signed)
I have asked our GI navigator susan to get pt a radiology appointment this afternoon to resolve the feeding tube issue. I will see him in 2 days as it's scheduled.   Jan, please follow up on this and see if pt needs any IVF today or tomorrow and if infusion room can accommodate. Thanks.  Truitt Merle  02/27/2016  3:28 PM

## 2016-02-27 NOTE — Telephone Encounter (Signed)
Called Angie back that the information has been given to Dr Burr Medico and we are waiting for an answer. Angie was expressing her frustration with the situation, this nurse gave emotional support.

## 2016-02-27 NOTE — Progress Notes (Signed)
Returned call to Dr. Servando Snare regarding Mr. Fred [name]. [name] nasojejunal tube clogged over the weekend and he is on his way to [facility] to have it replaced. Dr. Servando Snare would like the patient to have a post pyloric Panda placed for patient's comfort. This message was communicated to [name] who will speak to MD at [facility].

## 2016-02-27 NOTE — Telephone Encounter (Signed)
Fred [name] called again asking if anyone had addressed the issue of her dad's feeding tube being clogged since Saturday. She is worried about her dad not eating/ getting nutrition. She called at 0920 this AM. The pt did attempt to eat today but is nauseated with the attempt.

## 2016-02-27 NOTE — Telephone Encounter (Signed)
Fred [name] left VM that Kunta's feeding tube has been clogged since 5:30 am on Saturday. Has had nothing down his tube since then. Went to ED over weekend with no intervention regarding his feeding tube. Says Dr. Servando Snare suggested another type of tube, but she can't remember. Message to MD.

## 2016-02-27 NOTE — Telephone Encounter (Signed)
Oncology Nurse Navigator Documentation  Oncology Nurse Navigator Flowsheets 02/27/2016  Navigator Location [facility]  Navigator Encounter Type Telephone  Telephone Outgoing Call;Appt Confirmation/Clarification  Abnormal Finding Date -  Confirmed Diagnosis Date -  Treatment Initiated Date -  Patient Visit Type -  Treatment Phase -  Barriers/Navigation Needs Coordination of Care---Radiology for feeding tube declogg or replace  Education -  Interventions Coordination of Care-scheduled to see Dr. Clovis Riley at [facility] today  Referrals -  Coordination of Care Radiology--notified wife to take him to radiology department now  Education Method Verbal  Support Groups/Services -  Specialty Items/DME -  Acuity -  Time Spent with Patient 15  Per Dr. Burr Medico, admission has been offered to him and he declines to work on getting surgical J-tube placed and nutrition started. Dietician to speak with Dr. Servando Snare regarding his recommendation on alternative feeding tube.

## 2016-02-28 ENCOUNTER — Encounter: Payer: Self-pay | Admitting: Hematology

## 2016-02-29 ENCOUNTER — Encounter: Payer: Self-pay | Admitting: Hematology

## 2016-02-29 ENCOUNTER — Ambulatory Visit (HOSPITAL_BASED_OUTPATIENT_CLINIC_OR_DEPARTMENT_OTHER): Payer: No Typology Code available for payment source | Admitting: Hematology

## 2016-02-29 ENCOUNTER — Ambulatory Visit: Payer: No Typology Code available for payment source

## 2016-02-29 ENCOUNTER — Other Ambulatory Visit: Payer: Self-pay | Admitting: *Deleted

## 2016-02-29 ENCOUNTER — Ambulatory Visit: Payer: No Typology Code available for payment source | Admitting: Nutrition

## 2016-02-29 VITALS — BP 119/53 | HR 89 | Temp 98.5°F | Resp 18 | Ht 65.0 in | Wt 119.7 lb

## 2016-02-29 DIAGNOSIS — R1310 Dysphagia, unspecified: Secondary | ICD-10-CM

## 2016-02-29 DIAGNOSIS — C155 Malignant neoplasm of lower third of esophagus: Secondary | ICD-10-CM

## 2016-02-29 DIAGNOSIS — I10 Essential (primary) hypertension: Secondary | ICD-10-CM | POA: Diagnosis not present

## 2016-02-29 DIAGNOSIS — E46 Unspecified protein-calorie malnutrition: Secondary | ICD-10-CM | POA: Diagnosis not present

## 2016-02-29 MED ORDER — UNABLE TO FIND: 1000.0000 mL | 10 refills | Status: DC

## 2016-02-29 NOTE — Progress Notes (Signed)
[place]  [phone] [phone]  Clinic Follow up Note   Patient Care Team: Fred Langton, MD as PCP - General (Internal Medicine) Fred Merle, MD as Consulting Physician (Hematology) Fred Rudd, MD as Consulting Physician (Radiation Oncology) Fred Isaac, MD as Consulting Physician (Cardiothoracic Surgery) Fred [name], RD as Dietitian (Nutrition)   CHIEF COMPLAINTS:  Follow up esophageal adenocarcinoma  Oncology History   Presented with solid dysphagia at least daily with chest pain. Involuntary weight loss of 10-12 lb over past year  Malignant neoplasm of lower third of esophagus (HCC)   Staging form: Esophagus - Adenocarcinoma, AJCC 7th Edition     Clinical stage from 12/15/2015: Stage IIIA (T3, N1, M0) - Signed by Fred Merle, MD on 12/24/2015       Malignant neoplasm of lower third of esophagus ([place])   11/23/2015 Procedure    EGD per [facility](Fred [name]):6cm mass in lower third of esophagus     11/24/2015 Initial Diagnosis    Esophageal cancer ([place])     11/24/2015 Pathology Results    Mucinous adenocarcinoma, moderately differentiated-Her2 analysis pending     12/05/2015 Imaging    PET scan showed hypermetabolic a mass in distal esophagus, hypermetabolic activity in the left anterior prostate gland, metabolic density in left adrenal nodule, CT attenuation suggest a benign adrenal adenoma. Left apical 2 mm lung nodule.     12/15/2015 Procedure    EUS showed a uT3N1 lesion from 27cm to 35cm from the incisors      12/26/2015 -  Chemotherapy    Weekly carboplatin AUC 2 and Taxol 45 mg/m, with concurrent radiation     12/26/2015 -  Radiation Therapy    Neoadjuvant chemoradiation to the esophageal cancer      HISTORY OF PRESENTING ILLNESS (12/14/2015):  Fred [name] 75 y.o. male is here because of His newly diagnosed esophageal cancer. He is accompanied by his wife and daughter to my clinic today.  He has had  dysphagia for 2 months, mainly with solid food, no dysphagia with soft food or liquid. He also has mild pain in mid chest when he swallows. No nausea, abdominal pain, or other discomfort. He has lost about 15 lbs in the last year, no other symoptoms. He was seen by his primary care physician at [facility], and referred to gastroenterologist Fred [name]. He underwent EGD on 11/23/2015, which showed a 6 cm mass in the lower third of esophagus. Biopsy showed adenocarcinoma, moderately differentiated. He was referred to [place] to consider neoadjuvant therapy. He is scheduled to see a Psychologist, sport and exercise at [facility] later this week.  He has had some urinary frequency, underwent TURP on 11/02/2015.   CURRENT THERAPY: supportive care with IVF and tube feeding   INTERIM HISTORY: Fred [name] returns for follow up. His NJ feeding tube got clotted again on last Saturday. He came in to [facility] emergency room, but was not able to get the issue resolved soon and he left emergency room. He did not eat or drink much for 2 days afterwards, and we had his feeding tube replaced by radiology on Monday August 7. He has restarted feeding again, which has been going well. He also had lap and IV fluids from home care service yesterday. He was seen by his surgeon Fred [name] her last week, was not happy with his weight loss, and we'll see him again next week. He denies any fever or chills, his chest pain and odynophagia has improved some  lately, but got little worse again since the NG tube exchange. He has not taking anything by mouth, he did try drinking ensure by mouth last week, but could not keep it down.   MEDICAL HISTORY:  Past Medical History:  Diagnosis Date  . Esophageal cancer ([place]) 11/23/15   lower 3rd esohagus   . GERD (gastroesophageal reflux disease)   . Hypertension     SURGICAL HISTORY: Past Surgical History:  Procedure Laterality Date  . cystoscope  4/12/1   prostate  . PROSTATE BIOPSY  10/05/15  . right shoulder  surgery      SOCIAL HISTORY: Social History   Social History  . Marital status: Married    Spouse name: N/A  . Number of children: N/A  . Years of education: N/A   Occupational History  . Not on file.   Social History Main Topics  . Smoking status: Former Smoker    Packs/day: 1.00    Years: 10.00    Quit date: 07/24/1967  . Smokeless tobacco: Never Used  . Alcohol use No  . Drug use: No  . Sexual activity: Not Currently   Other Topics Concern  . Not on file   Social History Narrative  . No narrative on file    FAMILY HISTORY: Family History  Problem Relation Age of Onset  . Cancer Maternal Grandfather 5    gastric cancer     ALLERGIES:  has No Known Allergies.  MEDICATIONS:  Current Outpatient Prescriptions  Medication Sig Dispense Refill  . acetaminophen (TYLENOL) 500 MG tablet Take 500 mg by mouth every 6 (six) hours as needed for headache. Reported on 02/08/2016    . atenolol (TENORMIN) 25 MG tablet Take 12.5 mg by mouth daily.     . mirtazapine (REMERON) 15 MG tablet Take 1 tablet (15 mg total) by mouth at bedtime. 30 tablet 1  . nystatin (MYCOSTATIN) 100000 UNIT/ML suspension Take 5 mLs (500,000 Units total) by mouth 4 (four) times daily. 100 mL 1  . omeprazole (PRILOSEC) 20 MG capsule Take 20 mg by mouth daily as needed (heartburn). Reported on 02/08/2016    . ondansetron (ZOFRAN) 8 MG tablet Take 1 tablet (8 mg total) by mouth every 8 (eight) hours as needed for nausea. 30 tablet 3  . oxyCODONE (OXY IR/ROXICODONE) 5 MG immediate release tablet Take 1-2 tablets (5-10 mg total) by mouth every 4 (four) hours as needed for severe pain. 100 tablet 0  . prochlorperazine (COMPAZINE) 10 MG tablet Take 1 tablet (10 mg total) by mouth every 6 (six) hours as needed. 30 tablet 3  . sucralfate (CARAFATE) 1 GM/10ML suspension Take 10 mLs (1 g total) by mouth 4 (four) times daily -  with meals and at bedtime. 420 mL 0  . polyethylene glycol (MIRALAX / GLYCOLAX) packet Take  17 g by mouth daily as needed for mild constipation. Reported on 02/08/2016    . UNABLE TO FIND Inject 1,000 mLs into the vein as directed. Please give 1L  Normal Saline over 2 hours IV   Every  Tuesday  And  Friday  Until  Further  Notice.  Please draw  CBC, CMET  Every  Other  Tuesday  And  Fax  Results  To  Dr. Ernestina Penna  Office  -   Fax     [phone]. 1000 mL 10   No current facility-administered medications for this visit.    Facility-Administered Medications Ordered in Other Visits  Medication Dose Route Frequency Provider Last Rate  Last Dose  . 0.9 %  sodium chloride infusion   Intravenous Once Fred Merle, MD        REVIEW OF SYSTEMS:   Constitutional: Denies fevers, chills or abnormal night sweats, very fatigued, (+) weight loss  Eyes: Denies blurriness of vision, double vision or watery eyes Ears, nose, mouth, throat, and face: Denies mucositis or sore throat Respiratory: Denies cough, dyspnea or wheezes Cardiovascular: Denies palpitation, chest discomfort or lower extremity swelling Gastrointestinal:  Denies nausea, heartburn or change in bowel habits Skin: Denies abnormal skin rashes Lymphatics: Denies new lymphadenopathy or easy bruising Neurological:Denies numbness, tingling or new weaknesses Behavioral/Psych: Mood is stable, no new changes  All other systems were reviewed with the patient and are negative.  PHYSICAL EXAMINATION: ECOG PERFORMANCE STATUS: 3  Vitals:   02/29/16 1344  BP: (!) 119/53  Pulse: 89  Resp: 18  Temp: 98.5 F (36.9 C)   Filed Weights   02/29/16 1344  Weight: 119 lb 11.2 oz (54.3 kg)    GENERAL:alert, no distress and comfortable SKIN: skin color, texture, turgor are normal, no rashes or significant lesions EYES: normal, conjunctiva are pink and non-injected, sclera clear OROPHARYNX:no exudate, no erythema and lips, buccal mucosa, and tongue normal  NECK: supple, thyroid normal size, non-tender, without nodularity LYMPH:  no palpable  lymphadenopathy in the cervical, axillary or inguinal LUNGS: clear to auscultation and percussion with normal breathing effort HEART: regular rate & rhythm and no murmurs and no lower extremity edema ABDOMEN:abdomen soft, non-tender and normal bowel sounds Musculoskeletal:no cyanosis of digits and no clubbing  PSYCH: alert & oriented x 3 with fluent speech NEURO: no focal motor/sensory deficits  LABORATORY DATA:  I have reviewed the data as listed CBC Latest Ref Rng & Units 02/24/2016 01/30/2016 01/23/2016  WBC 4.0 - 10.3 10e3/uL 7.6 4.1 4.8  Hemoglobin 13.0 - 17.1 g/dL 12.0(L) 13.7 14.6  Hematocrit 38.4 - 49.9 % 34.7(L) 39.1 41.5  Platelets 140 - 400 10e3/uL 266 129(L) 144   CMP Latest Ref Rng & Units 02/24/2016 01/30/2016 01/23/2016  Glucose 70 - 140 mg/dl 106 148(H) 136  BUN 7.0 - 26.0 mg/dL 9.6 17.0 14.3  Creatinine 0.7 - 1.3 mg/dL 0.5(L) 0.8 0.8  Sodium 136 - 145 mEq/L 135(L) 139 139  Potassium 3.5 - 5.1 mEq/L 3.4(L) 4.0 4.1  CO2 22 - 29 mEq/L 26 26 25   Calcium 8.4 - 10.4 mg/dL 8.4 9.3 9.3  Total Protein 6.4 - 8.3 g/dL 5.5(L) 6.2(L) 6.3(L)  Total Bilirubin 0.20 - 1.20 mg/dL 0.35 0.68 0.55  Alkaline Phos 40 - 150 U/L 110 88 83  AST 5 - 34 U/L 22 19 16   ALT 0 - 55 U/L 19 15 15    His lab from yesterday (done by home service): CBC: WBC 6.9, hemoglobin 11.2, platelet 319K CMP: Within normal limits except total protein 4.9, albumin 2.7, calcium 7.8 and K 3.4   Pathology report Diagnosis 11/23/2015 Mass, lower third of the esophagus, mucosal biopsy Mucinous adenocarcinoma, moderately differentiated.  HER 2 (-)   RADIOGRAPHIC STUDIES: I have personally reviewed the radiological images as listed and agreed with the findings in the report. Dg Abd 1 View  Result Date: 02/23/2016 CLINICAL DATA:  Evaluate feeding tube placement, history of esophageal carcinoma EXAM: ABDOMEN - 1 VIEW COMPARISON:  Abdomen film of 02/08/2016 FINDINGS: The NG tube does not appear to be changed in position. The  tube probably courses into the duodenum extending into the proximal jejunum within the left upper quadrant. The bowel gas  pattern is nonspecific the lung bases appear clear. IMPRESSION: NG tube tip is within the left upper quadrant presumably within proximal jejunum. Electronically Signed   By: Ivar Drape M.D.   On: 02/23/2016 14:38   Dg Abd 1 View  Result Date: 02/08/2016 CLINICAL DATA:  Nasogastric tube placement.  Esophageal carcinoma. EXAM: ABDOMEN - 1 VIEW COMPARISON:  February 04, 2016 FINDINGS: Nasogastric tube tip is at the level of the proximal most aspect of the jejunum. The side port is in the proximal fourth portion of the duodenum. There is moderate stool in the colon. The bowel gas pattern is unremarkable. No bowel dilatation or air-fluid level to suggest obstruction. No free air. Lung bases are clear. IMPRESSION: Nasogastric tube tip is at the origin of the jejunum with the side port in the fourth portion of the duodenum. If this tube is to be used for feeding, its position is excellent for that purpose. Bowel gas pattern unremarkable. Moderate stool in colon. Lung bases clear. Electronically Signed   By: Lowella Grip III M.D.   On: 02/08/2016 15:10   Dg Addison Bailey G Tube Plc W/fl W/rad  Result Date: 02/27/2016 CLINICAL DATA:  Esophageal carcinoma. EXAM: NASO G TUBE PLACEMENT WITH FL AND WITH RAD CONTRAST:  68m ISOVUE-300 IOPAMIDOL (ISOVUE-300) INJECTION 61% FLUOROSCOPY TIME:  Radiation Exposure Index (as provided by the fluoroscopic device): 68.8 COMPARISON:  None. FINDINGS: The existing nasogastric tube was removed over a 185 cm Amplatz wire. 8Aberdeenbrand feeding tube was in placed over the Amplatz wire duodenum. 20 cc of Isovue was injected into the feeding tube confirming post pyloric placement of the tip of feeding tube. Procedure image confirms tip of the feeding tube at the junction between the third and fourth portion of the duodenum. IMPRESSION: 1. Successful exchange of  nasogastric tube for a Kangroo brand weighted feeding tube. Injection of contrast material through the feeding tube confirms post pyloric placement of the tube with tip at the level of the junction between the third and fourth portion of the duodenum. Electronically Signed   By: TKerby MoorsM.D.   On: 02/27/2016 17:23   Dg NAddison BaileyG Tube Plc W/fl W/rad  Result Date: 02/16/2016 CLINICAL DATA:  Clogged feeding tube. EXAM: NASO G TUBE PLACEMENT WITH FL AND WITH RAD CONTRAST:  Isovue 200 FLUOROSCOPY TIME:  Radiation Exposure Index (as provided by the fluoroscopic device): 137.12 mGy If the device does not provide the exposure index: Fluoroscopy Time (in minutes and seconds): 15 minutes and 16 seconds Number of Acquired Images: COMPARISON:  02/08/2016 FINDINGS: The NG to tip was clogged. I was able to maneuver a stiff glide wire out through the end of the tube. The more proximal side port was widely patent. The tube was advanced into the junction of the second and third portions of the duodenum. IMPRESSION: The distal obstruction of the NG tube was unclogged and the tube was advanced into the duodenum. Electronically Signed   By: PMarijo SanesM.D.   On: 02/16/2016 08:18  Dg NAddison BaileyG Tube Plc W/fl W/rad  Result Date: 02/04/2016 INDICATION: Malignant neoplasm involving the lower third of the esophagus post chemo and radiation. Please place post gastric nasogastric tube for enteric nutrition supplementation. EXAM: NASO G TUBE PLACEMENT WITH FL AND WITH RAD COMPARISON:  PET-CT - 12/05/2015 FLUOROSCOPY TIME:  6 minutes, 48 seconds COMPLICATIONS: None immediate PROCEDURE: The nasogastric tube was lubricated with viscous lidocaine inserted into the right nostril. Under intermittent fluoroscopic guidance, the Dobhoff  tube was advanced through the stomach, through the duodenum with tip ultimately terminating over the expected location of the duodenal - jejunal junction. Contrast was injected via the nasogastric tube and a  spot fluoroscopic image was saved for documentation purposes. The tube was affixed to the patient's nose with tape. The patient tolerated the procedure well without immediate postprocedural complication. FINDINGS: Contrast injection demonstrates opacification of the distal aspect of the duodenum. IMPRESSION: Successful fluoroscopic guided placement of Dobhoff tube with tip terminating over the duodenojejunal junction. The tube is ready for immediate use. Electronically Signed   By: Sandi Mariscal M.D.   On: 02/04/2016 15:32   EGD by Fred [name] 12/20/2015 Impression: -A 6 cm mass was found in the lower third of the esophagus. Multiple biopsies were performed. -A sliding small sized hiatal hernia otherwise normal mucosa in stomach into duodenum.  PET scan 12/05/2015 ([facility]) Impression #1 distal esophagus which she'll hypermetabolic mass consistent with known malignancy #2 hypermetabolic activity in the left anterior prostate gland, correlate for malignancy #3 metabolic density left adrenal nodule. CT attenuation suggest a benign adrenal adenoma which can be moderately hypermetabolic. However given history of malignancy, metastatic disease is not excluded. #4 left apical 2 mm pulmonary nodule, too small for evaluation by PET.  EUS at [facility] 12/14/2015: Impressions: Mass was noted from 27 cm to 35 cm from the in sisters. By EUS criteria and the lesion was T3, there was 1-2 metastatic regional lymph nodes.  ASSESSMENT & PLAN:  75 year old male presented with dysphagia with solid food  1. Distal esophageal adenocarcinoma, [id number] -I reviewed his CT scan, PET scan, EGD, and the biopsy findings with patient and his family member in details. -The PET scan showed no definitive distant metastasis. 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 This will be followed in the future scan. -By EUS, he has T3 N1 stage III disease. -He completed  neoadjuvant radiation with weekly Carbo and Taxol. -He has developed moderate to severe dysphagia and odynophagia, not able to take oral nutrition, has had NJ-tube placed on 02/04/2016,and was exchanged on 8/7 due to clotting  -We'll continue tube feeding, this will be managed by our dietitian Pamala Hurry. I encourage him to increase his feeding amount to gain some weight  -He will continue IV fluids with normal saline 1 L on Tuesday and Friday   -lab results reviewed with patient and his wife, he will continue lab monitoring every 2 weeks on Tuesday by home care   2. HTN -his BP has been normal lately    -continue low dose atenolol   3. Odynophagia and Nausea -He knows to take the Zofran and Compazine as needed for nausea -He will continue oxycodone as needed -Overall improved, but has not resolved, he still not able to take anything by mouth   4. Malnutrition and weight loss -his weight has be fluctuating, overall he has not can much weight back  -He is on tube feeds now, will be managed by our dietitian Pamala Hurry. I  encouraged him to increase the hours of tube feeding, to get more nutrition and weight back.  5. Depression  -Continue mirtazapine   Plan -He will follow-up with dietitian Pamala Hurry  -IV fluids with normal saline 1 L every Tuesday and Friday  -Lab every other week on Tuesday by home care services  -I'll see him back in 2 weeks   All questions were answered. The patient knows to call the clinic with  any problems, questions or concerns.  I spent 20 minutes counseling the patient face to face. The total time spent in the appointment was 25 minutes and more than 50% was on counseling.     Fred Merle, MD 02/29/2016

## 2016-02-29 NOTE — Progress Notes (Signed)
Patient did not need to stay for IV fluids. I contacted him by phone. Weight decreased and documented as 119 pounds down from 123 pounds August 3. Patient is tolerating Osmolite 1.5 at 70 mL an hour for approximately 13 and half hours for a total of 4 cans daily. Patient denies any problems with tube feeding the last 2 nights. Reports new feeding tube is working well. Patient tried to drink some ensure by mouth and vomited directly after  Encouraged patient to increase continuous feedings to 80 mL an hour tonight working back to goal rate of 96 mL an hour over 15 hours along with free water flushes. Patient is agreeable to increasing to 80 cc an hour and will contact me if he develops any problems. Will continue to work with patient over the telephone as needed.

## 2016-03-04 ENCOUNTER — Other Ambulatory Visit: Payer: Self-pay | Admitting: Oncology

## 2016-03-04 DIAGNOSIS — T85598D Other mechanical complication of other gastrointestinal prosthetic devices, implants and grafts, subsequent encounter: Secondary | ICD-10-CM

## 2016-03-04 NOTE — Telephone Encounter (Signed)
Pt's daughter called for his clogged feeding tube. He completed 6 cans feeding at 12N today, and it clogged  imediately after the water flush. Pt is able to drink some water by mouth. I called on-call radiologist Dr. Thornton Papas at [facility], and he will arrange the tube exchange tomorrow (Monday) early morning at [facility]. I spoke with pt's daughter and she voiced good understanding. Pt may try small amount water and feeding by mouth today. They will also use Coke to flush the tube to see if it will resolve the clogging.   Truitt Merle  03/04/2016 4:24 PM

## 2016-03-05 ENCOUNTER — Inpatient Hospital Stay (HOSPITAL_COMMUNITY): Admission: RE | Admit: 2016-03-05 | Payer: No Typology Code available for payment source | Source: Ambulatory Visit

## 2016-03-06 ENCOUNTER — Encounter: Payer: Self-pay | Admitting: Radiation Oncology

## 2016-03-06 ENCOUNTER — Encounter: Payer: Self-pay | Admitting: Cardiothoracic Surgery

## 2016-03-06 ENCOUNTER — Ambulatory Visit (INDEPENDENT_AMBULATORY_CARE_PROVIDER_SITE_OTHER): Payer: Non-veteran care | Admitting: Cardiothoracic Surgery

## 2016-03-06 ENCOUNTER — Ambulatory Visit
Admission: RE | Admit: 2016-03-06 | Discharge: 2016-03-06 | Disposition: A | Discharge status: Home / Self Care | Payer: No Typology Code available for payment source | Source: Ambulatory Visit | Attending: Radiation Oncology | Admitting: Radiation Oncology

## 2016-03-06 ENCOUNTER — Other Ambulatory Visit: Payer: Self-pay | Admitting: *Deleted

## 2016-03-06 VITALS — BP 115/55 | HR 90 | Temp 98.5°F | Resp 15 | Ht 65.0 in | Wt 120.9 lb

## 2016-03-06 VITALS — BP 114/70 | HR 96 | Resp 16 | Ht 65.0 in | Wt 120.0 lb

## 2016-03-06 DIAGNOSIS — C159 Malignant neoplasm of esophagus, unspecified: Secondary | ICD-10-CM | POA: Insufficient documentation

## 2016-03-06 DIAGNOSIS — Y842 Radiological procedure and radiotherapy as the cause of abnormal reaction of the patient, or of later complication, without mention of misadventure at the time of the procedure: Secondary | ICD-10-CM | POA: Diagnosis not present

## 2016-03-06 DIAGNOSIS — C155 Malignant neoplasm of lower third of esophagus: Secondary | ICD-10-CM

## 2016-03-06 DIAGNOSIS — K208 Other esophagitis: Secondary | ICD-10-CM | POA: Diagnosis not present

## 2016-03-06 DIAGNOSIS — E46 Unspecified protein-calorie malnutrition: Secondary | ICD-10-CM | POA: Insufficient documentation

## 2016-03-06 DIAGNOSIS — R1310 Dysphagia, unspecified: Secondary | ICD-10-CM | POA: Insufficient documentation

## 2016-03-06 NOTE — Progress Notes (Signed)
[address]       [place] [address]             [phone]                    [name] [place] Medical Record #[id number] Date of Birth: 09/25/1940  Referring: Truitt Merle, MD Primary Care: [name], MD  Chief Complaint:    Chief Complaint  Patient presents with  . Follow-up    2 wk to further discuss esophageal cancer/surgery...UNABLE TO TOLERATE ANY PO    History of Present Illness:    Fred [name] 75 y.o. male was originally seen in the office at his request for a second surgical opinion concerning his recently diagnosed adenocarcinoma the distal esophagus. The patient gives a history of many years of reflux symptoms taking acid blocking medications. For the past 3 months he had noted increasing difficulty swallowing specially solid food and bread. He was referred by the [facility] to [facility] were upper GI endoscopy was performed a near obstructing mass 6 cm lower third of the esophagus was noted. 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 By EUS criteria the lesion was T3 with breaks into the muscularis propria total thickness 9 mm, there was present regional lymph nodes, staged N1. . The patient Completed radiation and chemotherapy at [facility] .   The patient completed radiation therapy July 13. He continues to have problems with nutrition. Currently having tube feedings continuously by NG tube. He has been very frustrated by this with the tube clotting.  Wt Readings from Last 3 Encounters:  03/06/16 120 lb (54.4 kg)  03/06/16 120 lb 14.4 oz (54.8 kg)  02/29/16 119 lb 11.2 oz (54.3 kg)     There is a family history of gastric cancer  in his maternal grandfather, patient's father died of a stroke in his 86s mother died in her 57s of Alzheimer.   Patient notes that when his symptoms first began he had chest discomfort and was referred to the [facility] ER from the [facility], he notes a stress test and echocardiogram were done at the [place] per his report he was told he had no cardiac issues- his wife will assist in obtaining these records  Current Activity/ Functional Status:  Patient is independent with mobility/ambulation, transfers, ADL's, IADL's.   Zubrod Score: At the time of surgery this patient's most appropriate activity status/level should be described as: [x]    0    Normal activity, no symptoms []    1    Restricted in physical strenuous activity but ambulatory, able to do out light work []    2    Ambulatory and capable of self care, unable to do work activities, up and about               >50 % of waking hours                              []    3    Only limited self care, in bed greater than 50% of waking hours []    4  Completely disabled, no self care, confined to bed or chair []    5    Moribund   Past Medical History:  Diagnosis Date  . Esophageal cancer ([place]) 11/23/15   lower 3rd esohagus   . GERD (gastroesophageal reflux disease)   . Hypertension     Past Surgical History:  Procedure Laterality Date  . cystoscope  4/12/1   prostate  . PROSTATE BIOPSY  10/05/15  . right shoulder surgery      Family History  Problem Relation Age of Onset  . Cancer Maternal Grandfather 34    gastric cancer    Stomach cancer Maternal Grandfather   Social History   Social History  . Marital status: Married    Spouse name: N/A  . Number of children: N/A  . Years of education: N/A   Occupational History  . Not on file.   Social History Main Topics  . Smoking status: Former Smoker    Packs/day: 1.00    Years: 10.00    Quit date: 07/24/1967  . Smokeless tobacco: Never Used  . Alcohol use No  . Drug use:  No  . Sexual activity: Not Currently   Other Topics Concern  . Not on file   Social History Narrative  . No narrative on file    History  Smoking Status  . Former Smoker  . Packs/day: 1.00  . Years: 10.00  . Quit date: 07/24/1967  Smokeless Tobacco  . Never Used    History  Alcohol Use No     No Known Allergies  Current Outpatient Prescriptions  Medication Sig Dispense Refill  . acetaminophen (TYLENOL) 500 MG tablet Take 500 mg by mouth every 6 (six) hours as needed for headache. Reported on 02/08/2016    . atenolol (TENORMIN) 25 MG tablet Take 12.5 mg by mouth daily.     Marland Kitchen nystatin (MYCOSTATIN) 100000 UNIT/ML suspension Take 5 mLs (500,000 Units total) by mouth 4 (four) times daily. 100 mL 1  . oxyCODONE (OXY IR/ROXICODONE) 5 MG immediate release tablet Take 1-2 tablets (5-10 mg total) by mouth every 4 (four) hours as needed for severe pain. 100 tablet 0  . polyethylene glycol (MIRALAX / GLYCOLAX) packet Take 17 g by mouth daily as needed for mild constipation. Reported on 02/08/2016    . UNABLE TO FIND Inject 1,000 mLs into the vein as directed. Please give 1L  Normal Saline over 2 hours IV   Every  Tuesday  And  Friday  Until  Further  Notice.  Please draw  CBC, CMET  Every  Other  Tuesday  And  Fax  Results  To  Dr. Ernestina Penna  Office  -   Fax     [phone]. 1000 mL 10  . mirtazapine (REMERON) 15 MG tablet Take 1 tablet (15 mg total) by mouth at bedtime. (Patient not taking: Reported on 03/06/2016) 30 tablet 1  . omeprazole (PRILOSEC) 20 MG capsule Take 20 mg by mouth daily as needed (heartburn). Reported on 02/08/2016    . ondansetron (ZOFRAN) 8 MG tablet Take 1 tablet (8 mg total) by mouth every 8 (eight) hours as needed for nausea. (Patient not taking: Reported on 03/06/2016) 30 tablet 3  . prochlorperazine (COMPAZINE) 10 MG tablet Take 1 tablet (10 mg total) by mouth every 6 (six) hours as needed. (Patient not taking: Reported on 03/06/2016) 30 tablet 3  . sucralfate  (CARAFATE) 1 GM/10ML suspension Take 10 mLs (1 g total) by mouth 4 (four) times daily -  with meals and at bedtime. (Patient not taking: Reported on 03/06/2016) 420 mL 0   No current facility-administered medications for this visit.    Facility-Administered Medications Ordered in Other Visits  Medication Dose Route Frequency Provider Last Rate Last Dose  . 0.9 %  sodium chloride infusion   Intravenous Once Truitt Merle, MD          Review of Systems:     Cardiac Review of Systems: Y or N  Chest Pain [y evaluated at [facility] last month ]  Resting SOB [ n  ] Exertional SOB  [n  ]  Orthopnea [ n ]   Pedal Edema [ n  ]    Palpitations [ n ] Syncope  [n  ]   Presyncope [ n  ]  General Review of Systems: [Y] = yes [  ]=no Constitional: recent weight change [  ];  Wt loss over the last 3 months [   ] anorexia [  ]; fatigue [  ]; nausea [  ]; night sweats [  ]; fever [  ]; or chills [  ];          Dental: poor dentition[  ]; Last Dentist visit:   Eye : blurred vision [  ]; diplopia [   ]; vision changes [  ];  Amaurosis fugax[  ]; Resp: cough [  ];  wheezing[  ];  hemoptysis[  ]; shortness of breath[  ]; paroxysmal nocturnal dyspnea[  ]; dyspnea on exertion[  ]; or orthopnea[  ];  GI:  gallstones[  ], vomiting[  ];  dysphagia[ y ]; [place] Muta  ];  hematochezia [  ]; heartburn[  ];   Hx of  Colonoscopy[ y ]; GU: kidney stones [  ]; hematuria[  ];   dysuria [  ];  nocturia[  ];  history of     obstruction [  ]; urinary frequency Blue.Reese  ]             Skin: rash, swelling[  ];, hair loss[  ];  peripheral edema[  ];  or itching[  ]; Musculosketetal: myalgias[  ];  joint swelling[  ];  joint erythema[  ];  joint pain[  ];  back pain[  ];  Heme/Lymph: bruising[  ];  bleeding[  ];  anemia[  ];  Neuro: TIA[  ];  headaches[  ];  stroke[  ];  vertigo[  ];  seizures[  ];   paresthesias[  ];  difficulty walking[chronic injury to left leg  ];  Psych:depression[n  ]; anxiety[ n ];  Endocrine: diabetes[  ];  thyroid  dysfunction[  ];  Immunizations: Flu up to date [ y]; Pneumococcal up to date [  y];  Other:  Physical Exam: BP 114/70   Pulse 96   Resp 16   Ht 5' 5" (1.651 m)   Wt 120 lb (54.4 kg)   SpO2 98% Comment: ON RA  BMI 19.97 kg/m   Wt Readings from Last 3 Encounters:  03/06/16 120 lb (54.4 kg)  03/06/16 120 lb 14.4 oz (54.8 kg)  02/29/16 119 lb 11.2 oz (54.3 kg)   PHYSICAL EXAMINATION: General appearance: alert, cooperative and no distress, NG Has been placed with a pen to 2.  Head: Normocephalic, without obvious abnormality, atraumatic Neck: no adenopathy, no carotid bruit, no JVD, supple, symmetrical, trachea midline and thyroid not enlarged, symmetric, no tenderness/mass/nodules Lymph nodes: Cervical, supraclavicular, and axillary nodes normal. Resp: clear to auscultation bilaterally Back: symmetric, no curvature.  ROM normal. No CVA tenderness. Cardio: regular rate and rhythm, S1, S2 normal, no murmur, click, rub or gallop GI: soft, non-tender; bowel sounds normal; no masses,  no organomegaly Extremities: extremities normal, atraumatic, no cyanosis or edema Neurologic: Grossly normal  Diagnostic Studies & Laboratory data:   Dg Abd 1 View  Result Date: 02/23/2016 CLINICAL DATA:  Evaluate feeding tube placement, history of esophageal carcinoma EXAM: ABDOMEN - 1 VIEW COMPARISON:  Abdomen film of 02/08/2016 FINDINGS: The NG tube does not appear to be changed in position. The tube probably courses into the duodenum extending into the proximal jejunum within the left upper quadrant. The bowel gas pattern is nonspecific the lung bases appear clear. IMPRESSION: NG tube tip is within the left upper quadrant presumably within proximal jejunum. Electronically Signed   By: Ivar Drape M.D.   On: 02/23/2016 14:38   Recent Lab Findings: Lab Results  Component Value Date   WBC 7.6 02/24/2016   HGB 12.0 (L) 02/24/2016   HCT 34.7 (L) 02/24/2016   PLT 266 02/24/2016   GLUCOSE 106 02/24/2016    ALT 19 02/24/2016   AST 22 02/24/2016   NA 135 (L) 02/24/2016   K 3.4 (L) 02/24/2016   CREATININE 0.5 (L) 02/24/2016   BUN 9.6 02/24/2016   CO2 26 02/24/2016   CT A/P with contrast 12/01/2015 Marked circumferential mid to distal esophageal wall thickening, no enlarged thoracic nodes or evidence of lung mets Small varices along the GE junction Periportal nodes measuring less than a centimeter. There is prominent upper abdominal node adjacent to the splenic vein/SMV confluence measuring 15x33m 13 mm left adrenal indeterminate nodule. Hypodense scattered liver lesions, the largest measuring 8 mm in the right, too small to characterize Enlarged heterogeneously enhancing prostate with ill-defined margins  12/15/2015 EGD/EUS 12/15/2015 Mass noted at 27CM from the incisors, by EUS criteria the lesion is T3 with breaks in the muscularis propria, total thickness 9 mm. Based on the presene of 1-2 metastatic regional lymph nodes, N staging was N1. The radial EUS would not pass through and did not dilate given clinical information already gleaned.  PATHOLOGY: Lower third of esophagus mass biopsy 11/23/2015 Mucinous adenocarcinoma, moderately differentiated.   PET:  IMPRESSION:  1.  Distal esophageal hypermetabolic mass consistent with known malignancy. 2.  Hypermetabolic activity in the left anterior prostate gland, correlate for malignancy. 3.  Metabolic density left adrenal nodule. 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 However, given history of malignancy, metastatic disease is not excluded. 4.  Left apical 2 mm pulmonary nodule, too small for evaluation by PET.   Result Narrative  INDICATION: C15.5: Malignant neoplasm of lower third of esophagus  TECHNIQUE: 7.1 millicuries of [id number] was administered intravenously. PET imaging was obtained from the skull base to the mid thighs. CT images were obtained for attenuation correction and localization  purposes. Glucose level was 103 MG/DL. Time from  injection to scan was 62 minutes.  Comparison none.  FINDINGS:  Hypermetabolic distal esophageal mass max SUV 8.5 (image 118) consistent with known malignancy.  Hypermetabolic activity within the left anterior prostate Max SUV 19.8, no mass identified by CT.  Low density left adrenal nodule measuring 1.4 cm (image 146). 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  Remaining activity in the body is normal and physiologic. Additional findings: Left apical 2 mm pulmonary nodule (image 66) 2 small for evaluation by PET. Gallbladder sludge. No wall thickening. Bilateral  renal cysts. Nonobstructing mid left renal stone. Colonic diverticulosis   I have independently reviewed the above radiology studies  and reviewed the findings with the patient.  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 Patient has continued weight loss and progressive malnutritiohis weight has been stable over the past week at around the 120 pounds., in order to even consider surgical resection he needs to reach an anabolic state.  I plan to see the patient back in 2 weeks with repeat staging CT scan of the chest and abdomen, he will need cardiology clearance  before proceeding with surgery.  the patient was instructed in the care of his feeding tube , techniques to unclog into secure in place more comfortably  .  [name] MD      [address] [place] [address] Office [phone]   Beeper [phone]  03/06/2016 5:09 PM

## 2016-03-06 NOTE — Progress Notes (Signed)
Radiation Oncology         [phone]) [phone] ________________________________  Name: Fred [name] MRN: [id number]  Date: 03/06/2016  DOB: [birth year]  Follow-Up Visit Note  CC: [name], MD  [name], MD  Diagnosis:  Stage IIIA, T3, N1,M0 adenocarcinoma of the esophagus  Interval Since Last Radiation:  1 month  12/26/15-02/02/16: 50.4 Gy to the esophagus including 5.4 Gy boost.  Narrative:  The patient returns today for routine follow-up.  During the course of treatment he did experience significant esophagitis, and continued to have weight loss and subsequently underwent Dobbhoff placement. He continues centrally nutrition and is taking and 16 day. He reports that he continues to have problems with dysphagia, and is disappointed by the fact that he has limited appetite. He denies any nausea but has been experiencing multiple episodes of having problems with his Dobbhoff clubbing off. He had this exchanged on several occasions as well.  No other complaints or verbalized.                             ALLERGIES:  has No Known Allergies.  Meds: Current Outpatient Prescriptions  Medication Sig Dispense Refill  . acetaminophen (TYLENOL) 500 MG tablet Take 500 mg by mouth every 6 (six) hours as needed for headache. Reported on 02/08/2016    . atenolol (TENORMIN) 25 MG tablet Take 12.5 mg by mouth daily.     Marland Kitchen nystatin (MYCOSTATIN) 100000 UNIT/ML suspension Take 5 mLs (500,000 Units total) by mouth 4 (four) times daily. 100 mL 1  . oxyCODONE (OXY IR/ROXICODONE) 5 MG immediate release tablet Take 1-2 tablets (5-10 mg total) by mouth every 4 (four) hours as needed for severe pain. 100 tablet 0  . UNABLE TO FIND Inject 1,000 mLs into the vein as directed. Please give 1L  Normal Saline over 2 hours IV   Every  Tuesday  And  Friday  Until  Further  Notice.  Please draw  CBC, CMET  Every  Other  Tuesday  And  Fax  Results  To  Dr. Ernestina Penna  Office  -   Fax     [phone]. 1000 mL 10  .  mirtazapine (REMERON) 15 MG tablet Take 1 tablet (15 mg total) by mouth at bedtime. (Patient not taking: Reported on 03/06/2016) 30 tablet 1  . omeprazole (PRILOSEC) 20 MG capsule Take 20 mg by mouth daily as needed (heartburn). Reported on 02/08/2016    . ondansetron (ZOFRAN) 8 MG tablet Take 1 tablet (8 mg total) by mouth every 8 (eight) hours as needed for nausea. (Patient not taking: Reported on 03/06/2016) 30 tablet 3  . polyethylene glycol (MIRALAX / GLYCOLAX) packet Take 17 g by mouth daily as needed for mild constipation. Reported on 02/08/2016    . prochlorperazine (COMPAZINE) 10 MG tablet Take 1 tablet (10 mg total) by mouth every 6 (six) hours as needed. (Patient not taking: Reported on 03/06/2016) 30 tablet 3  . sucralfate (CARAFATE) 1 GM/10ML suspension Take 10 mLs (1 g total) by mouth 4 (four) times daily -  with meals and at bedtime. (Patient not taking: Reported on 03/06/2016) 420 mL 0   No current facility-administered medications for this encounter.    Facility-Administered Medications Ordered in Other Encounters  Medication Dose Route Frequency Provider Last Rate Last Dose  . 0.9 %  sodium chloride infusion   Intravenous Once Truitt Merle, MD        Physical Findings:  height is  5\' 5"  (1.651 m) and weight is 120 lb 14.4 oz (54.8 kg). His oral temperature is 98.5 F (36.9 C). His blood pressure is 115/55 (abnormal) and his pulse is 90. His respiration is 15 and oxygen saturation is 99%.  In general this is chronically ill and thin appearing caucasian male in no acute distress. He's alert and oriented x4 and appropriate throughout the examination. Cardiopulmonary assessment is negative for acute distress and he exhibits normal effort.     Lab Findings: Lab Results  Component Value Date   WBC 7.6 02/24/2016   HGB 12.0 (L) 02/24/2016   HCT 34.7 (L) 02/24/2016   MCV 90.3 02/24/2016   PLT 266 02/24/2016     Radiographic Findings: Dg Abd 1 View  Result Date: 02/23/2016 CLINICAL  DATA:  Evaluate feeding tube placement, history of esophageal carcinoma EXAM: ABDOMEN - 1 VIEW COMPARISON:  Abdomen film of 02/08/2016 FINDINGS: The NG tube does not appear to be changed in position. The tube probably courses into the duodenum extending into the proximal jejunum within the left upper quadrant. The bowel gas pattern is nonspecific the lung bases appear clear. IMPRESSION: NG tube tip is within the left upper quadrant presumably within proximal jejunum. Electronically Signed   By: Ivar Drape M.D.   On: 02/23/2016 14:38   Dg Abd 1 View  Result Date: 02/08/2016 CLINICAL DATA:  Nasogastric tube placement.  Esophageal carcinoma. EXAM: ABDOMEN - 1 VIEW COMPARISON:  February 04, 2016 FINDINGS: Nasogastric tube tip is at the level of the proximal most aspect of the jejunum. The side port is in the proximal fourth portion of the duodenum. There is moderate stool in the colon. The bowel gas pattern is unremarkable. No bowel dilatation or air-fluid level to suggest obstruction. No free air. Lung bases are clear. IMPRESSION: Nasogastric tube tip is at the origin of the jejunum with the side port in the fourth portion of the duodenum. If this tube is to be used for feeding, its position is excellent for that purpose. Bowel gas pattern unremarkable. Moderate stool in colon. Lung bases clear. Electronically Signed   By: Lowella Grip III M.D.   On: 02/08/2016 15:10   Dg Addison Bailey G Tube Plc W/fl W/rad  Result Date: 02/27/2016 CLINICAL DATA:  Esophageal carcinoma. EXAM: NASO G TUBE PLACEMENT WITH FL AND WITH RAD CONTRAST:  38mL ISOVUE-300 IOPAMIDOL (ISOVUE-300) INJECTION 61% FLUOROSCOPY TIME:  Radiation Exposure Index (as provided by the fluoroscopic device): 68.8 COMPARISON:  None. FINDINGS: The existing nasogastric tube was removed over a 185 cm Amplatz wire. Braymer brand feeding tube was in placed over the Amplatz wire duodenum. 20 cc of Isovue was injected into the feeding tube confirming post  pyloric placement of the tip of feeding tube. Procedure image confirms tip of the feeding tube at the junction between the third and fourth portion of the duodenum. IMPRESSION: 1. Successful exchange of nasogastric tube for a Kangroo brand weighted feeding tube. Injection of contrast material through the feeding tube confirms post pyloric placement of the tube with tip at the level of the junction between the third and fourth portion of the duodenum. Electronically Signed   By: Kerby Moors M.D.   On: 02/27/2016 17:23   Dg Addison Bailey G Tube Plc W/fl W/rad  Result Date: 02/16/2016 CLINICAL DATA:  Clogged feeding tube. EXAM: NASO G TUBE PLACEMENT WITH FL AND WITH RAD CONTRAST:  Isovue 200 FLUOROSCOPY TIME:  Radiation Exposure Index (as provided by the fluoroscopic device): 137.12 mGy  If the device does not provide the exposure index: Fluoroscopy Time (in minutes and seconds): 15 minutes and 16 seconds Number of Acquired Images: COMPARISON:  02/08/2016 FINDINGS: The NG to tip was clogged. I was able to maneuver a stiff glide wire out through the end of the tube. The more proximal side port was widely patent. The tube was advanced into the junction of the second and third portions of the duodenum. IMPRESSION: The distal obstruction of the NG tube was unclogged and the tube was advanced into the duodenum. Electronically Signed   By: Marijo Sanes M.D.   On: 02/16/2016 08:18   Impression/Plan: 1. Stage IIIA, T3, N1,M0 adenocarcinoma of the esophagus. The patient is still recovering from radiotherapy treatment. He is hoping to proceed surgically but is trying to increase his oral intake and weight. He is planning to have another CT scan of the chest and abdomen prior to surgery. We will plan to see him back in 6 months time for continued evaluation.  2. 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 The patient continues enteral nutrition with a Dobbhoff tube. It had been malfunctioning  earlier this week but since exchange appears to be working better. We anticipate that he will be able to resume oral intake in the near future.   3. Dysphagia. I strongly suggested that the patient consider restarting Carafate. He will consider this, and also continue his narcotic pain medication as well. He was encouraged to try using the prior to scheduled all meals as well. He states agreement and understanding of this.     Carola Rhine, PAC

## 2016-03-06 NOTE — Progress Notes (Addendum)
Nursing    Mr. Dimattia is here for a one month follow up visit for malignant neoplasm of lower third of esophagus.   Mr. Fortino is not  taking carafate po, still  taking oxycodone po for esophageal pain, taking to eat solid food very little unable to tolerate.   Still has difficulty swallowing , receiving IVF's on Tuesday's and  Friday by home health nurse.  Skin to chest with mild hyperpigmentation not using any skin cream or lotion.  Taking in 6 cans of Osmolite a day at 8 ml per hours with 60cc of water an hour flush with 100 cc of water before and after the tube feeding. Wt Readings from Last 3 Encounters:  03/06/16 120 lb 14.4 oz (54.8 kg)  02/29/16 119 lb 11.2 oz (54.3 kg)  02/25/16 123 lb (55.8 kg)  BP (!) 115/55 (BP Location: Right Arm, Patient Position: Sitting, Cuff Size: Normal)   Pulse 90   Temp 98.5 F (36.9 C) (Oral)   Resp 15   Ht 5\' 5"  (1.651 m)   Wt 120 lb 14.4 oz (54.8 kg)   SpO2 99%   BMI 20.12 kg/m

## 2016-03-07 ENCOUNTER — Other Ambulatory Visit: Payer: Self-pay | Admitting: *Deleted

## 2016-03-07 DIAGNOSIS — C155 Malignant neoplasm of lower third of esophagus: Secondary | ICD-10-CM

## 2016-03-08 ENCOUNTER — Telehealth: Payer: Self-pay | Admitting: *Deleted

## 2016-03-08 ENCOUNTER — Other Ambulatory Visit: Payer: Self-pay | Admitting: Hematology

## 2016-03-08 ENCOUNTER — Ambulatory Visit (HOSPITAL_COMMUNITY)
Admission: RE | Admit: 2016-03-08 | Discharge: 2016-03-08 | Disposition: A | Discharge status: Home / Self Care | Payer: No Typology Code available for payment source | Source: Ambulatory Visit | Attending: Oncology | Admitting: Oncology

## 2016-03-08 ENCOUNTER — Encounter: Payer: No Typology Code available for payment source | Admitting: Nutrition

## 2016-03-08 ENCOUNTER — Other Ambulatory Visit: Payer: Self-pay | Admitting: *Deleted

## 2016-03-08 ENCOUNTER — Telehealth: Payer: Self-pay | Admitting: Nutrition

## 2016-03-08 ENCOUNTER — Telehealth: Payer: Self-pay | Admitting: Hematology

## 2016-03-08 DIAGNOSIS — Z4659 Encounter for fitting and adjustment of other gastrointestinal appliance and device: Secondary | ICD-10-CM | POA: Insufficient documentation

## 2016-03-08 DIAGNOSIS — C155 Malignant neoplasm of lower third of esophagus: Secondary | ICD-10-CM | POA: Insufficient documentation

## 2016-03-08 MED ORDER — IOPAMIDOL (ISOVUE-300) INJECTION 61%
50.0000 mL | Freq: Once | INTRAVENOUS | Status: AC | PRN
  Administered 2016-03-08: 10 mL via INTRAVENOUS

## 2016-03-08 NOTE — Telephone Encounter (Signed)
APPOINTMENT CONF WITH Pt. APPT LETTER AND SCHD ALSO MAILED PER PT REQUEST. 03/08/16

## 2016-03-08 NOTE — Telephone Encounter (Signed)
I received a phone call from patient's wife this morning stating Mr. Macauley tube was clogged again. Call was also made to Dr. Burr Medico and nursing. Patient is scheduled to come in today to have feeding tube unclogged. Strongly urged patient to consider surgical jejunostomy tube which is less likely to clog.  Current weight documented as 120 pounds on August 15.  Will change tube feeding to Osmolite 1.5 at 60 mL an hour with 45 cc water flush every hour for 24 hours.    Osmolite 1.5 at 60 mL an hour with 45 mL water flush will provide 2160 cal, 90 g protein, 2177 mL free water.  Estimated nutrition needs: 1820-2135 calories, 75-90 grams protein, greater than 2 L fluid daily.  If patient can achieve goal rate of feeding consistently he should meet greater than 100% estimated nutrition needs for weight gain.  Wife educated, over the telephone and she was able to repeat back tube feeding changes.  **Disclaimer: This note was dictated with voice recognition software. Similar sounding words can inadvertently be transcribed and this note may contain transcription errors which may not have been corrected upon publication of note.**

## 2016-03-08 NOTE — Telephone Encounter (Signed)
Wife left VM that feeding tube clogged again and has had no nutrition during night. He is unable to take any po's. Requests call as soon as possible to discuss. Forwarded message to Dr. Ernestina Penna collaborative nurse.

## 2016-03-12 ENCOUNTER — Ambulatory Visit: Payer: No Typology Code available for payment source | Admitting: Nutrition

## 2016-03-12 DIAGNOSIS — C155 Malignant neoplasm of lower third of esophagus: Secondary | ICD-10-CM

## 2016-03-12 DIAGNOSIS — E46 Unspecified protein-calorie malnutrition: Secondary | ICD-10-CM

## 2016-03-12 MED ORDER — OSMOLITE 1.5 CAL PO LIQD: ORAL | 0 refills | Status: DC

## 2016-03-12 NOTE — Progress Notes (Signed)
Received a phone call from Fraser Din concerned with patient's weight.  She reports patient has lost 0.5 pounds since Thursday. He is tolerating continuous Osmolite 1.5 at 60 mL/hr over 24 hours with 45 mL free water flush hourly. He is having a bowel movement every 2-3 days. Still not trying to swallow water despite encouragement.  Current TF and free water flushes provide 2130 kcal, 89.4 grams protein, 2166 mL free water.  Revised Estimated Nutrition needs: 2100-2400 kcal, 90-110 grams protein, 2.4 L fluid  Nutrition Diagnosis:   Unintended weight loss continues.  Intervention: Increase continuous tube feeding to 70 mL/hr over 24 hours with 50 mL free water flush.  Flush tube with 120 mL when changing bag once daily. This will provide 2485 kcal, 104 grams protein, 2587 mL free water. This provides 100% or more of estimated nutrition needs. Orders written and Rebecca notified. Teach back method used. Questions answered.  Monitoring, Evaluation, Goals:  Patient will tolerate increase tube feeding and free water flushes to promote weight gain.  Next Visit: Follow up by telephone.  **Disclaimer: This note was dictated with voice recognition software. Similar sounding words can inadvertently be transcribed and this note may contain transcription errors which may not have been corrected upon publication of note.**

## 2016-03-13 ENCOUNTER — Telehealth: Payer: Self-pay | Admitting: *Deleted

## 2016-03-13 ENCOUNTER — Encounter: Payer: Self-pay | Admitting: *Deleted

## 2016-03-13 ENCOUNTER — Other Ambulatory Visit: Payer: Self-pay | Admitting: *Deleted

## 2016-03-13 ENCOUNTER — Ambulatory Visit (HOSPITAL_COMMUNITY)
Admission: RE | Admit: 2016-03-13 | Discharge: 2016-03-13 | Disposition: A | Discharge status: Home / Self Care | Payer: No Typology Code available for payment source | Source: Ambulatory Visit | Attending: Hematology | Admitting: Hematology

## 2016-03-13 ENCOUNTER — Telehealth: Payer: Self-pay

## 2016-03-13 DIAGNOSIS — C155 Malignant neoplasm of lower third of esophagus: Secondary | ICD-10-CM | POA: Insufficient documentation

## 2016-03-13 DIAGNOSIS — Z431 Encounter for attention to gastrostomy: Secondary | ICD-10-CM | POA: Diagnosis not present

## 2016-03-13 NOTE — Telephone Encounter (Signed)
Wife called again asking what to do about pt's clogged feeding tube. S/w Dr Burr Medico and she is working on it.

## 2016-03-13 NOTE — Telephone Encounter (Signed)
Spoke with wife Fraser Din and instructed her to bring pt in at 145 pm today to [facility] for [place] Tube replaced.  Pat voiced understanding.  Order placed in EPIC.

## 2016-03-13 NOTE — Progress Notes (Signed)
Oncology Nurse Navigator Documentation  Oncology Nurse Navigator Flowsheets 03/13/2016  Navigator Location [facility]  Navigator Encounter Type Telephone  Telephone Incoming Call;Symptom Mgt  Abnormal Finding Date -  Confirmed Diagnosis Date -  Treatment Initiated Date -  Patient Visit Type -  Treatment Phase -  Barriers/Navigation Needs -Wife called this am to report the feeding tube was clogged again.   Education -  Interventions Other-transferred VM to Production designer, theatre/television/film. Afterwards, was able to locate the hospital inpatient procedure for declogging feeding tube using Pancrelipase/sodium bicarb and forward to collaborative nurse for potential future use.  Referrals -  Coordination of Care -  Education Method -  Support Groups/Services -  Specialty Items/DME -  Acuity -  Time Spent with Patient -  Per Dr. Janeece Fitting in radiology there is a product called [employer] "Clog Zapper" that is an effective and inexpensive way to resolve clogged tubes without requiring replacement. Found website and reached out to company to call navigator to describe product in more detail. One kit is $34.99 (unsure if insurance covers this). Requesting company to ship a sample to try for effectiveness before suggesting wife order the product.

## 2016-03-14 ENCOUNTER — Telehealth: Payer: Self-pay | Admitting: *Deleted

## 2016-03-14 ENCOUNTER — Telehealth: Payer: Self-pay | Admitting: Nutrition

## 2016-03-14 NOTE — Telephone Encounter (Signed)
Wife called me to let me know that feeding tube was clogged again. Provided support and encouragement to continue to be vigilant regarding tube feedings. Wife frustrated with husband because he refuses to swallow although he is physically able to do so.

## 2016-03-14 NOTE — Telephone Encounter (Signed)
Spoke with pt at home today and instructed pt  NOT to come in for appt with md on 03/15/16 as per md's instructions.   Informed pt that as long as [facility] draws lab work and giving pt IVF at home,  Dr. Burr Medico can still monitor pt's progress.  Pt voiced understanding.

## 2016-03-15 ENCOUNTER — Ambulatory Visit: Payer: No Typology Code available for payment source | Admitting: Hematology

## 2016-03-20 ENCOUNTER — Ambulatory Visit (INDEPENDENT_AMBULATORY_CARE_PROVIDER_SITE_OTHER): Payer: No Typology Code available for payment source | Admitting: Cardiothoracic Surgery

## 2016-03-20 ENCOUNTER — Ambulatory Visit
Admission: RE | Admit: 2016-03-20 | Discharge: 2016-03-20 | Disposition: A | Discharge status: Home / Self Care | Payer: No Typology Code available for payment source | Source: Ambulatory Visit | Attending: Cardiothoracic Surgery | Admitting: Cardiothoracic Surgery

## 2016-03-20 ENCOUNTER — Telehealth: Payer: Self-pay | Admitting: *Deleted

## 2016-03-20 ENCOUNTER — Encounter: Payer: Self-pay | Admitting: Cardiothoracic Surgery

## 2016-03-20 ENCOUNTER — Other Ambulatory Visit: Payer: Self-pay | Admitting: *Deleted

## 2016-03-20 VITALS — BP 124/75 | HR 112 | Resp 16 | Ht 65.0 in | Wt 120.5 lb

## 2016-03-20 DIAGNOSIS — C155 Malignant neoplasm of lower third of esophagus: Secondary | ICD-10-CM | POA: Diagnosis not present

## 2016-03-20 MED ORDER — IOPAMIDOL (ISOVUE-300) INJECTION 61%
100.0000 mL | Freq: Once | INTRAVENOUS | Status: AC | PRN
  Administered 2016-03-20: 100 mL via INTRAVENOUS

## 2016-03-20 NOTE — Telephone Encounter (Signed)
Received call from pt's wife stating that pt's feeding tube is clogged again.  She reports that they have several appts today so not sure when they can do anything.  [facility] nurse there now for IVF which take @ 2 hours & CT scheduled for

## 2016-03-20 NOTE — Progress Notes (Signed)
[address]       [place] [address]             [phone]                    Herron Hoh Faison Medical Record #[id number] Date of Birth: 1941/05/21  Referring: Everrett Coombe, MD Primary Care: [name], MD  Chief Complaint:    Chief Complaint  Patient presents with  . Esophageal Cancer    2 wk f/u with CT C/A/P    History of Present Illness:    Fred [name] 75 y.o. male was originally seen in the office at his request for a second surgical opinion concerning his recently diagnosed adenocarcinoma the distal esophagus. The patient gives a history of many years of reflux symptoms taking acid blocking medications. For the past 3 months he had noted increasing difficulty swallowing specially solid food and bread. He was referred by the [facility] to [facility] were upper GI endoscopy was performed a near obstructing mass 6 cm lower third of the esophagus was noted. 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 By EUS criteria the lesion was T3 with breaks into the muscularis propria total thickness 9 mm, there was present regional lymph nodes, staged N1. . The patient Completed radiation and chemotherapy at [facility] .   The patient completed radiation therapy July 13. He continues to have problems with nutrition. Currently having tube feedings continuously by NG tube. He has been very frustrated by this with the tube clotting.   Wt Readings from Last 3 Encounters:  03/20/16 120 lb 8 oz (54.7 kg)  03/06/16 120 lb 14.4 oz (54.8 kg)  03/06/16 120 lb (54.4 kg)     There is a family history of gastric cancer in his maternal grandfather, patient's father  died of a stroke in his 33s mother died in her 20s of Alzheimer.   Patient notes that when his symptoms first began he had chest discomfort and was referred to the [facility] ER from the [facility], he notes a stress test and echocardiogram were done at the [place] per his report he was told he had no cardiac issues- his wife will assist in obtaining these records  Current Activity/ Functional Status:  Patient is independent with mobility/ambulation, transfers, ADL's, IADL's.   Zubrod Score: At the time of surgery this patient's most appropriate activity status/level should be described as: [x]     0    Normal activity, no symptoms []     1    Restricted in physical strenuous activity but ambulatory, able to do out light work []     2    Ambulatory and capable of self care, unable to do work activities, up and about               >50 % of waking hours                              []     3    Only limited self care, in bed greater than 50% of waking hours []     4    Completely disabled,  no self care, confined to bed or chair []     5    Moribund   Past Medical History:  Diagnosis Date  . Esophageal cancer ([place]) 11/23/15   lower 3rd esohagus   . GERD (gastroesophageal reflux disease)   . Hypertension     Past Surgical History:  Procedure Laterality Date  . cystoscope  4/12/1   prostate  . PROSTATE BIOPSY  10/05/15  . right shoulder surgery      Family History  Problem Relation Age of Onset  . Cancer Maternal Grandfather 59    gastric cancer    Stomach cancer Maternal Grandfather   Social History   Social History  . Marital status: Married    Spouse name: N/A  . Number of children: N/A  . Years of education: N/A   Occupational History  . Not on file.   Social History Main Topics  . Smoking status: Former Smoker    Packs/day: 1.00    Years: 10.00    Quit date: 07/24/1967  . Smokeless tobacco: Never Used  . Alcohol use No  . Drug use: No  . Sexual activity: Not Currently    Other Topics Concern  . Not on file   Social History Narrative  . No narrative on file    History  Smoking Status  . Former Smoker  . Packs/day: 1.00  . Years: 10.00  . Quit date: 07/24/1967  Smokeless Tobacco  . Never Used    History  Alcohol Use No     No Known Allergies  Current Outpatient Prescriptions  Medication Sig Dispense Refill  . acetaminophen (TYLENOL) 500 MG tablet Take 500 mg by mouth every 6 (six) hours as needed for headache. Reported on 02/08/2016    . atenolol (TENORMIN) 25 MG tablet Take 12.5 mg by mouth daily.     . Nutritional Supplements (FEEDING SUPPLEMENT, OSMOLITE 1.5 CAL,) LIQD Increase Osmolite 1.5 to 70 mL/hr continuously over 24 hours via Jejunostomy with 50 mL free water flush every hour. Flush with 120 mL water once daily when changing bag. 1659 mL 0  . nystatin (MYCOSTATIN) 100000 UNIT/ML suspension Take 5 mLs (500,000 Units total) by mouth 4 (four) times daily. 100 mL 1  . omeprazole (PRILOSEC) 20 MG capsule Take 20 mg by mouth daily as needed (heartburn). Reported on 02/08/2016    . oxyCODONE (OXY IR/ROXICODONE) 5 MG immediate release tablet Take 1-2 tablets (5-10 mg total) by mouth every 4 (four) hours as needed for severe pain. 100 tablet 0  . UNABLE TO FIND Inject 1,000 mLs into the vein as directed. Please give 1L  Normal Saline over 2 hours IV   Every  Tuesday  And  Friday  Until  Further  Notice.  Please draw  CBC, CMET  Every  Other  Tuesday  And  Fax  Results  To  Dr. Ernestina Penna  Office  -   Fax     [phone]. 1000 mL 10  . mirtazapine (REMERON) 15 MG tablet Take 1 tablet (15 mg total) by mouth at bedtime. (Patient not taking: Reported on 03/06/2016) 30 tablet 1  . ondansetron (ZOFRAN) 8 MG tablet Take 1 tablet (8 mg total) by mouth every 8 (eight) hours as needed for nausea. (Patient not taking: Reported on 03/06/2016) 30 tablet 3  . polyethylene glycol (MIRALAX / GLYCOLAX) packet Take 17 g by mouth daily as needed for mild constipation.  Reported on 02/08/2016    . prochlorperazine (COMPAZINE) 10 MG tablet Take 1  tablet (10 mg total) by mouth every 6 (six) hours as needed. (Patient not taking: Reported on 03/06/2016) 30 tablet 3  . sucralfate (CARAFATE) 1 GM/10ML suspension Take 10 mLs (1 g total) by mouth 4 (four) times daily -  with meals and at bedtime. (Patient not taking: Reported on 03/06/2016) 420 mL 0   No current facility-administered medications for this visit.    Facility-Administered Medications Ordered in Other Visits  Medication Dose Route Frequency Provider Last Rate Last Dose  . 0.9 %  sodium chloride infusion   Intravenous Once Truitt Merle, MD          Review of Systems:     Cardiac Review of Systems: Y or N  Chest Pain [y evaluated at [facility] last month ]  Resting SOB [ n  ] Exertional SOB  [n  ]  Orthopnea [ n ]   Pedal Edema [ n  ]    Palpitations [ n ] Syncope  [n  ]   Presyncope [ n  ]  General Review of Systems: [Y] = yes [  ]=no Constitional: recent weight change [  ];  Wt loss over the last 3 months [   ] anorexia [  ]; fatigue [  ]; nausea [  ]; night sweats [  ]; fever [  ]; or chills [  ];          Dental: poor dentition[  ]; Last Dentist visit:   Eye : blurred vision [  ]; diplopia [   ]; vision changes [  ];  Amaurosis fugax[  ]; Resp: cough [  ];  wheezing[  ];  hemoptysis[  ]; shortness of breath[  ]; paroxysmal nocturnal dyspnea[  ]; dyspnea on exertion[  ]; or orthopnea[  ];  GI:  gallstones[  ], vomiting[  ];  dysphagia[ y ]; [place] Muta  ];  hematochezia [  ]; heartburn[  ];   Hx of  Colonoscopy[ y ]; GU: kidney stones [  ]; hematuria[  ];   dysuria [  ];  nocturia[  ];  history of     obstruction [  ]; urinary frequency Blue.Reese  ]             Skin: rash, swelling[  ];, hair loss[  ];  peripheral edema[  ];  or itching[  ]; Musculosketetal: myalgias[  ];  joint swelling[  ];  joint erythema[  ];  joint pain[  ];  back pain[  ];  Heme/Lymph: bruising[  ];  bleeding[  ];  anemia[  ];  Neuro: TIA[  ];   headaches[  ];  stroke[  ];  vertigo[  ];  seizures[  ];   paresthesias[  ];  difficulty walking[chronic injury to left leg  ];  Psych:depression[n  ]; anxiety[ n ];  Endocrine: diabetes[  ];  thyroid dysfunction[  ];  Immunizations: Flu up to date [ y]; Pneumococcal up to date [  y];  Other:  Physical Exam: BP 124/75   Pulse (!) 112   Resp 16   Ht 5' 5"  (1.651 m)   Wt 120 lb 8 oz (54.7 kg)   SpO2 99% Comment: ON RA  BMI 20.05 kg/m   Wt Readings from Last 3 Encounters:  03/20/16 120 lb 8 oz (54.7 kg)  03/06/16 120 lb 14.4 oz (54.8 kg)  03/06/16 120 lb (54.4 kg)   PHYSICAL EXAMINATION: General appearance: alert, cooperative and no distress, NG Has been placed with a pen  to 2.  Head: Normocephalic, without obvious abnormality, atraumatic Neck: no adenopathy, no carotid bruit, no JVD, supple, symmetrical, trachea midline and thyroid not enlarged, symmetric, no tenderness/mass/nodules Lymph nodes: Cervical, supraclavicular, and axillary nodes normal. Resp: clear to auscultation bilaterally Back: symmetric, no curvature. ROM normal. No CVA tenderness. Cardio: regular rate and rhythm, S1, S2 normal, no murmur, click, rub or gallop GI: soft, non-tender; bowel sounds normal; no masses,  no organomegaly Extremities: extremities normal, atraumatic, no cyanosis or edema Neurologic: Grossly normal  Diagnostic Studies & Laboratory data:  Ct Chest W Contrast & Ct Abdomen W Contrast  Result Date: 03/20/2016 CLINICAL DATA:  New distal esophageal carcinoma. Status post neoadjuvant radiation therapy and chemotherapy. Restaging. EXAM: CT CHEST AND ABDOMEN WITH CONTRAST TECHNIQUE: Multidetector CT imaging of the chest and abdomen was performed following the standard protocol during bolus administration of intravenous contrast. CONTRAST:  16m ISOVUE-300 IOPAMIDOL (ISOVUE-300) INJECTION 61% [facility]  [facility] FINDINGS: CT CHEST WITH CONTRAST Cardiovascular: Normal heart size. Aortic atherosclerosis. Mild LAD coronary artery calcification also noted. Mediastinum/Lymph Nodes: No pathologically enlarged lymph nodes identified. A feeding tube is seen in place. Small hiatal hernia again seen. Mild wall thickening of distal thoracic esophagus is again demonstrated, without significant change. Lungs/Pleura: No pulmonary mass, infiltrate, or effusion. Mild subpleural scarring and associated peripheral bronchiectasis seen in medial right lower lobe. Musculoskeletal: No chest wall mass or suspicious bone lesions identified. CT ABDOMEN WITH CONTRAST Hepatobiliary: Multiple tiny sub-cm low-attenuation lesions noted in both the right and left hepatic lobes are too small to characterize, but remain stable since prior CT 12/01/2015.  Gallbladder is unremarkable. Pancreas: No mass, inflammatory changes, or other significant abnormality. Spleen: Within normal limits in size and appearance. Adrenals/Urinary Tract: A 1.3 cm left adrenal nodule remains stable in size since previous study. This showed hypermetabolic activity on prior PET scan, and is indeterminate. Normal appearance of right adrenal gland. Bilateral renal cysts are again seen, however there is no evidence of renal masses or hydronephrosis. Stomach/Bowel: No evidence of obstruction, inflammatory process, or abnormal fluid collections. Vascular/Lymphatic: No pathologically enlarged lymph nodes. No evidence of abdominal aortic aneurysm. Aortic atherosclerosis. Other: None. Musculoskeletal: No suspicious bone lesions identified. Old mild L1 vertebral body compression fracture deformity is stable since prior exam. IMPRESSION: Stable distal thoracic esophageal wall thickening and small hiatal hernia. No evidence of metastatic disease within the thorax. Stable 1.3 cm indeterminate left adrenal nodule. This showed hypermetabolic activity on prior PET scan. Consider  continued attention on follow-up CT, or abdomen MRI without and with contrast for further characterization. Tiny sub-cm low-attenuation liver lesions are too small to characterize but remain stable, suggesting benign etiology. These could also be followed by CT, or evaluated further by MRI. Incidentally noted aortic atherosclerosis and coronary artery calcification. Electronically Signed   By: JEarle GellM.D.   On: 03/20/2016 16:40     Dg Abd 1 View  Result Date: 02/23/2016 CLINICAL DATA:  Evaluate feeding tube placement, history of esophageal carcinoma EXAM: ABDOMEN - 1 VIEW COMPARISON:  Abdomen film of 02/08/2016 FINDINGS: The NG tube does not appear to be changed in position. The tube probably courses into the duodenum extending into the proximal jejunum within the left upper quadrant. The bowel gas pattern is nonspecific the lung bases appear clear. IMPRESSION: NG tube tip is within the left upper quadrant presumably within proximal jejunum. Electronically Signed   By: PEddie Dibbles  Alvester Chou M.D.   On: 02/23/2016 14:38   Recent Lab Findings: Lab Results  Component Value Date   WBC 7.6 02/24/2016   HGB 12.0 (L) 02/24/2016   HCT 34.7 (L) 02/24/2016   PLT 266 02/24/2016   GLUCOSE 106 02/24/2016   ALT 19 02/24/2016   AST 22 02/24/2016   NA 135 (L) 02/24/2016   K 3.4 (L) 02/24/2016   CREATININE 0.5 (L) 02/24/2016   BUN 9.6 02/24/2016   CO2 26 02/24/2016   CT A/P with contrast 12/01/2015 Marked circumferential mid to distal esophageal wall thickening, no enlarged thoracic nodes or evidence of lung mets Small varices along the GE junction Periportal nodes measuring less than a centimeter. There is prominent upper abdominal node adjacent to the splenic vein/SMV confluence measuring 15x79m 13 mm left adrenal indeterminate nodule. Hypodense scattered liver lesions, the largest measuring 8 mm in the right, too small to characterize Enlarged heterogeneously enhancing prostate with ill-defined  margins  12/15/2015 EGD/EUS 12/15/2015 Mass noted at 27CM from the incisors, by EUS criteria the lesion is T3 with breaks in the muscularis propria, total thickness 9 mm. Based on the presene of 1-2 metastatic regional lymph nodes, N staging was N1. The radial EUS would not pass through and did not dilate given clinical information already gleaned.  PATHOLOGY: Lower third of esophagus mass biopsy 11/23/2015 Mucinous adenocarcinoma, moderately differentiated.   PET:  IMPRESSION:  1.  Distal esophageal hypermetabolic mass consistent with known malignancy. 2.  Hypermetabolic activity in the left anterior prostate gland, correlate for malignancy. 3.  Metabolic density left adrenal nodule. 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 However, given history of malignancy, metastatic disease is not excluded. 4.  Left apical 2 mm pulmonary nodule, too small for evaluation by PET.   Result Narrative  INDICATION: C15.5: Malignant neoplasm of lower third of esophagus  TECHNIQUE: 7.1 millicuries of [id number] was administered intravenously. PET imaging was obtained from the skull base to the mid thighs. CT images were obtained for attenuation correction and localization purposes. Glucose level was 103 MG/DL. Time from  injection to scan was 62 minutes.  Comparison none.  FINDINGS:  Hypermetabolic distal esophageal mass max SUV 8.5 (image 118) consistent with known malignancy.  Hypermetabolic activity within the left anterior prostate Max SUV 19.8, no mass identified by CT.  Low density left adrenal nodule measuring 1.4 cm (image 146). 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  Remaining activity in the body is normal and physiologic. Additional findings: Left apical 2 mm pulmonary nodule (image 66) 2 small for evaluation by PET. Gallbladder sludge. No wall thickening. Bilateral renal cysts. Nonobstructing mid  left renal stone. Colonic diverticulosis   I have independently reviewed the above radiology studies  and reviewed the findings with the patient.  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 Patient has gained some weight and strength with aggressive tube feeding management. He came into the office today with the tube  again clogged his family was instructed how to unclog the tube which was done in the office without difficulty He needs to continue to gain more weight prior to consideration of surgical resection We will make an appointment for him with cardiology as he will needcardiology clearance  before proceeding with surgery.   the patient was instructed in the care of his feeding tube , techniques to unclog into secure in place more comfortably .  Plan to see him back in 2 weeks.  [name] MD      [address] [place] [address] Office [phone]   Beeper [phone]  03/20/2016 5:26 PM

## 2016-03-21 ENCOUNTER — Other Ambulatory Visit: Payer: Self-pay | Admitting: *Deleted

## 2016-03-21 ENCOUNTER — Telehealth: Payer: Self-pay

## 2016-03-21 ENCOUNTER — Ambulatory Visit: Payer: No Typology Code available for payment source | Admitting: Nutrition

## 2016-03-21 ENCOUNTER — Telehealth: Payer: Self-pay | Admitting: *Deleted

## 2016-03-21 ENCOUNTER — Ambulatory Visit (HOSPITAL_COMMUNITY): Admission: RE | Admit: 2016-03-21 | Payer: No Typology Code available for payment source | Source: Ambulatory Visit

## 2016-03-21 NOTE — Telephone Encounter (Signed)
Called and spoke with Fred [name] @ [facility]   [phone]  [phone].  Let her know that Dr. Burr Medico wants to discontinue his home IVF that he is getting twice a week.  However, she would like to continue lab draws weekly for the the next 3 weeks.  She verbalized understanding and gave me her direct number.

## 2016-03-21 NOTE — Progress Notes (Signed)
Patient's wife contacted me with questions about free water flushes. Apparently feeding tube was clogged again yesterday.  However, Dr. Servando Snare was able to unclog it. Patient's wife was told by someone in interventional radiology that she should try 100 cc free water flush to help keep feeding tube from clogging. Patient's wife is wondering if this would be okay.  Patient is infusing Osmolite 1.5 at 73 mL an hour with 60 cc free water flushes every hour over 24 hours. (Wife increased feeding rate to 73 mL on her own)  Estimated nutrition needs: 1820-2135 calories, 75-90 grams protein, greater than 2 L fluid.    Current tube feeding and free water flushes provide 2628 cal, 110 g protein, 2775 mL free water.  This is 48 cal per kilogram.  I contacted patient after speaking to M.D.  Patient has decided he does not feel comfortable with free water flushes 100 cc per hour and states he will increased to 75 cc an hour providing an additional 360 mL free water daily. (3067 mL daily)  M.D. is going to cancel IV fluids and run weekly labs to monitor her electrolytes.

## 2016-03-21 NOTE — Telephone Encounter (Signed)
Wife called stating that pt's feeding tube was unclogged at Dr Everrett Coombe office yesterday and is working [place] today so she cancelled appt today for unclogging tube.

## 2016-03-23 ENCOUNTER — Ambulatory Visit (HOSPITAL_COMMUNITY)
Admission: RE | Admit: 2016-03-23 | Discharge: 2016-03-23 | Disposition: A | Discharge status: Home / Self Care | Payer: No Typology Code available for payment source | Source: Ambulatory Visit | Attending: Hematology | Admitting: Hematology

## 2016-03-23 ENCOUNTER — Ambulatory Visit (HOSPITAL_BASED_OUTPATIENT_CLINIC_OR_DEPARTMENT_OTHER): Payer: No Typology Code available for payment source | Admitting: Hematology

## 2016-03-23 ENCOUNTER — Encounter: Payer: Self-pay | Admitting: Hematology

## 2016-03-23 ENCOUNTER — Telehealth: Payer: Self-pay | Admitting: *Deleted

## 2016-03-23 ENCOUNTER — Other Ambulatory Visit: Payer: Self-pay | Admitting: *Deleted

## 2016-03-23 DIAGNOSIS — C155 Malignant neoplasm of lower third of esophagus: Secondary | ICD-10-CM

## 2016-03-23 DIAGNOSIS — E46 Unspecified protein-calorie malnutrition: Secondary | ICD-10-CM | POA: Diagnosis not present

## 2016-03-23 DIAGNOSIS — R110 Nausea: Secondary | ICD-10-CM

## 2016-03-23 DIAGNOSIS — I10 Essential (primary) hypertension: Secondary | ICD-10-CM | POA: Diagnosis not present

## 2016-03-23 DIAGNOSIS — Z4659 Encounter for fitting and adjustment of other gastrointestinal appliance and device: Secondary | ICD-10-CM | POA: Insufficient documentation

## 2016-03-23 MED ORDER — OXYCODONE HCL 5 MG PO TABS: 5.0000 mg | ORAL_TABLET | ORAL | 0 refills | Status: DC | PRN

## 2016-03-23 MED ORDER — DIATRIZOATE MEGLUMINE & SODIUM 66-10 % PO SOLN
30.0000 mL | Freq: Once | ORAL | Status: DC
  Filled 2016-03-23: qty 30

## 2016-03-23 NOTE — Telephone Encounter (Addendum)
Received vm call this am from pt's wife stating that feeding tube is stopped up again & needs someone to urgently return call.  She states that it is his only means of food & he will die of starvation if they have to wait until Monday.   Returned call & spoke to pt & was told that Dr Samule Dry office got it unstopped tues at appt by working with a smaller syringe & coke.  He reports that they have worked with coke and the syringe that Dr Jobie Quaker gave them for @ 3 hours yest eve when it clogged while changing the feeding bag.   Called radiology scheduling & asked for appt for today for feeding tube replacement.  Orders re-entered for stat per schedulers request.  Received call from [place] for pt to come in @ 12:45 for 1:30 appt.   Called & spoke with wife & encouraged to come to office 1st & we will work on unclogging before appt at radiology.  She expressed understanding.  Pt came in & this RN tried to unclog feeding tube with coke without success.

## 2016-03-23 NOTE — Progress Notes (Signed)
[place]  Telephone:([phone] Fax:(336) [phone]  Clinic Follow up Note   Patient Care Team: [name], MD as PCP - General (Internal Medicine) Truitt Merle, MD as Consulting Physician (Hematology) Kyung Rudd, MD as Consulting Physician (Radiation Oncology) [name], MD as Consulting Physician (Cardiothoracic Surgery) Karie Mainland, RD as Dietitian (Nutrition)   CHIEF COMPLAINTS:  Follow up esophageal adenocarcinoma  Oncology History   Presented with solid dysphagia at least daily with chest pain. Involuntary weight loss of 10-12 lb over past year  Malignant neoplasm of lower third of esophagus (HCC)   Staging form: Esophagus - Adenocarcinoma, AJCC 7th Edition     Clinical stage from 12/15/2015: Stage IIIA (T3, N1, M0) - Signed by Truitt Merle, MD on 12/24/2015       Malignant neoplasm of lower third of esophagus ([place])   11/23/2015 Procedure    EGD per [facility](Dr. Toledo):6cm mass in lower third of esophagus      11/24/2015 Initial Diagnosis    Esophageal cancer ([place])      11/24/2015 Pathology Results    Mucinous adenocarcinoma, moderately differentiated-Her2 analysis pending      12/05/2015 Imaging    PET scan showed hypermetabolic a mass in distal esophagus, hypermetabolic activity in the left anterior prostate gland, metabolic density in left adrenal nodule, CT attenuation suggest a benign adrenal adenoma. Left apical 2 mm lung nodule.      12/15/2015 Procedure    EUS showed a uT3N1 lesion from 27cm to 35cm from the incisors       12/26/2015 - 02/02/2016 Chemotherapy    Weekly carboplatin AUC 2 and Taxol 45 mg/m, with concurrent radiation      12/26/2015 - 02/02/2016 Radiation Therapy    Neoadjuvant chemoradiation to the esophageal cancer       HISTORY OF PRESENTING ILLNESS (12/14/2015):  Fred [name] 75 y.o. male is here because of His newly diagnosed esophageal cancer. He is accompanied by his wife and daughter to my  clinic today.  He has had dysphagia for 2 months, mainly with solid food, no dysphagia with soft food or liquid. He also has mild pain in mid chest when he swallows. No nausea, abdominal pain, or other discomfort. He has lost about 15 lbs in the last year, no other symoptoms. He was seen by his primary care physician at [facility], and referred to gastroenterologist Dr. Alice Reichert. He underwent EGD on 11/23/2015, which showed a 6 cm mass in the lower third of esophagus. Biopsy showed adenocarcinoma, moderately differentiated. He was referred to [place] to consider neoadjuvant therapy. He is scheduled to see a Psychologist, sport and exercise at [facility] later this week.  He has had some urinary frequency, underwent TURP on 11/02/2015.   CURRENT THERAPY: supportive care with IVF and tube feeding   INTERIM HISTORY: Mr. Holtsclaw returns for follow up. His NJ feeding tube has been clogged frequently (every 1-2 weeks), despite frequent flush. He is now on continuous tube feeding, with what of flushing every hour. His tube clotted again last night, his wife tried &the declotting kit which she brought from Internet (recommended by radiology), but failed. He came in for NG-tube exchange again today. His weight has been stable overall, he has started eating small amounts of food, such as cereal, [place] toast, milk etc. No significant nausea or diarrhea. He was seen by Dr. Pia Mau last week.   MEDICAL HISTORY:  Past Medical History:  Diagnosis Date  . Esophageal cancer ([place]) 11/23/15   lower  3rd esohagus   . GERD (gastroesophageal reflux disease)   . Hypertension     SURGICAL HISTORY: Past Surgical History:  Procedure Laterality Date  . cystoscope  4/12/1   prostate  . PROSTATE BIOPSY  10/05/15  . right shoulder surgery      SOCIAL HISTORY: Social History   Social History  . Marital status: Married    Spouse name: N/A  . Number of children: N/A  . Years of education: N/A   Occupational History  . Not on file.   Social  History Main Topics  . Smoking status: Former Smoker    Packs/day: 1.00    Years: 10.00    Quit date: 07/24/1967  . Smokeless tobacco: Never Used  . Alcohol use No  . Drug use: No  . Sexual activity: Not Currently   Other Topics Concern  . Not on file   Social History Narrative  . No narrative on file    FAMILY HISTORY: Family History  Problem Relation Age of Onset  . Cancer Maternal Grandfather 68    gastric cancer     ALLERGIES:  has No Known Allergies.  MEDICATIONS:  Current Outpatient Prescriptions  Medication Sig Dispense Refill  . acetaminophen (TYLENOL) 500 MG tablet Take 500 mg by mouth every 6 (six) hours as needed for headache. Reported on 02/08/2016    . atenolol (TENORMIN) 25 MG tablet Take 12.5 mg by mouth daily.     . mirtazapine (REMERON) 15 MG tablet Take 1 tablet (15 mg total) by mouth at bedtime. (Patient not taking: Reported on 03/06/2016) 30 tablet 1  . Nutritional Supplements (FEEDING SUPPLEMENT, OSMOLITE 1.5 CAL,) LIQD Increase Osmolite 1.5 to 70 mL/hr continuously over 24 hours via Jejunostomy with 50 mL free water flush every hour. Flush with 120 mL water once daily when changing bag. 1659 mL 0  . nystatin (MYCOSTATIN) 100000 UNIT/ML suspension Take 5 mLs (500,000 Units total) by mouth 4 (four) times daily. 100 mL 1  . omeprazole (PRILOSEC) 20 MG capsule Take 20 mg by mouth daily as needed (heartburn). Reported on 02/08/2016    . ondansetron (ZOFRAN) 8 MG tablet Take 1 tablet (8 mg total) by mouth every 8 (eight) hours as needed for nausea. (Patient not taking: Reported on 03/06/2016) 30 tablet 3  . oxyCODONE (OXY IR/ROXICODONE) 5 MG immediate release tablet Take 1-2 tablets (5-10 mg total) by mouth every 4 (four) hours as needed for severe pain. 100 tablet 0  . polyethylene glycol (MIRALAX / GLYCOLAX) packet Take 17 g by mouth daily as needed for mild constipation. Reported on 02/08/2016    . prochlorperazine (COMPAZINE) 10 MG tablet Take 1 tablet (10 mg total)  by mouth every 6 (six) hours as needed. (Patient not taking: Reported on 03/06/2016) 30 tablet 3  . sucralfate (CARAFATE) 1 GM/10ML suspension Take 10 mLs (1 g total) by mouth 4 (four) times daily -  with meals and at bedtime. (Patient not taking: Reported on 03/06/2016) 420 mL 0  . UNABLE TO FIND Inject 1,000 mLs into the vein as directed. Please give 1L  Normal Saline over 2 hours IV   Every  Tuesday  And  Friday  Until  Further  Notice.  Please draw  CBC, CMET  Every  Other  Tuesday  And  Fax  Results  To  Dr. Ernestina Penna  Office  -   Fax     [phone]. 1000 mL 10   No current facility-administered medications for this visit.  Facility-Administered Medications Ordered in Other Visits  Medication Dose Route Frequency Provider Last Rate Last Dose  . 0.9 %  sodium chloride infusion   Intravenous Once Truitt Merle, MD      . diatrizoate meglumine-sodium (GASTROGRAFIN) 66-10 % solution 30 mL  30 mL Per NG tube Once Gus Height, MD        REVIEW OF SYSTEMS:   Constitutional: Denies fevers, chills or abnormal night sweats, very fatigued, (+) weight loss  Eyes: Denies blurriness of vision, double vision or watery eyes Ears, nose, mouth, throat, and face: Denies mucositis or sore throat Respiratory: Denies cough, dyspnea or wheezes Cardiovascular: Denies palpitation, chest discomfort or lower extremity swelling Gastrointestinal:  Denies nausea, heartburn or change in bowel habits Skin: Denies abnormal skin rashes Lymphatics: Denies new lymphadenopathy or easy bruising Neurological:Denies numbness, tingling or new weaknesses Behavioral/Psych: Mood is stable, no new changes  All other systems were reviewed with the patient and are negative.  PHYSICAL EXAMINATION: ECOG PERFORMANCE STATUS: 3 Barbara pressure 124/75, heart rate 112, respiratory rate 16, pulse ox 99% on room air, weight 120 pounds, GENERAL:alert, no distress and comfortable, NJ tube in place  SKIN: skin color, texture, turgor are normal,  no rashes or significant lesions EYES: normal, conjunctiva are pink and non-injected, sclera clear OROPHARYNX:no exudate, no erythema and lips, buccal mucosa, and tongue normal  NECK: supple, thyroid normal size, non-tender, without nodularity LYMPH:  no palpable lymphadenopathy in the cervical, axillary or inguinal LUNGS: clear to auscultation and percussion with normal breathing effort HEART: regular rate & rhythm and no murmurs and no lower extremity edema ABDOMEN:abdomen soft, non-tender and normal bowel sounds Musculoskeletal:no cyanosis of digits and no clubbing  PSYCH: alert & oriented x 3 with fluent speech NEURO: no focal motor/sensory deficits  LABORATORY DATA:  I have reviewed the data as listed CBC Latest Ref Rng & Units 02/24/2016 01/30/2016 01/23/2016  WBC 4.0 - 10.3 10e3/uL 7.6 4.1 4.8  Hemoglobin 13.0 - 17.1 g/dL 12.0(L) 13.7 14.6  Hematocrit 38.4 - 49.9 % 34.7(L) 39.1 41.5  Platelets 140 - 400 10e3/uL 266 129(L) 144   CMP Latest Ref Rng & Units 02/24/2016 01/30/2016 01/23/2016  Glucose 70 - 140 mg/dl 106 148(H) 136  BUN 7.0 - 26.0 mg/dL 9.6 17.0 14.3  Creatinine 0.7 - 1.3 mg/dL 0.5(L) 0.8 0.8  Sodium 136 - 145 mEq/L 135(L) 139 139  Potassium 3.5 - 5.1 mEq/L 3.4(L) 4.0 4.1  CO2 22 - 29 mEq/L 26 26 25   Calcium 8.4 - 10.4 mg/dL 8.4 9.3 9.3  Total Protein 6.4 - 8.3 g/dL 5.5(L) 6.2(L) 6.3(L)  Total Bilirubin 0.20 - 1.20 mg/dL 0.35 0.68 0.55  Alkaline Phos 40 - 150 U/L 110 88 83  AST 5 - 34 U/L 22 19 16   ALT 0 - 55 U/L 19 15 15    His lab from yesterday (done by home service): CBC: WBC 6.9, hemoglobin 11.2, platelet 319K CMP: Within normal limits except total protein 4.9, albumin 2.7, calcium 7.8 and K 3.4   Pathology report Diagnosis 11/23/2015 Mass, lower third of the esophagus, mucosal biopsy Mucinous adenocarcinoma, moderately differentiated.  HER 2 (-)   RADIOGRAPHIC STUDIES: I have personally reviewed the radiological images as listed and agreed with the findings  in the report. Dg Abd 1 View  Result Date: 02/23/2016 CLINICAL DATA:  Evaluate feeding tube placement, history of esophageal carcinoma EXAM: ABDOMEN - 1 VIEW COMPARISON:  Abdomen film of 02/08/2016 FINDINGS: The NG tube does not appear to be changed in position.  The tube probably courses into the duodenum extending into the proximal jejunum within the left upper quadrant. The bowel gas pattern is nonspecific the lung bases appear clear. IMPRESSION: NG tube tip is within the left upper quadrant presumably within proximal jejunum. Electronically Signed   By: Ivar Drape M.D.   On: 02/23/2016 14:38   Ct Chest W Contrast  Result Date: 03/20/2016 CLINICAL DATA:  New distal esophageal carcinoma. Status post neoadjuvant radiation therapy and chemotherapy. Restaging. EXAM: CT CHEST AND ABDOMEN WITH CONTRAST TECHNIQUE: Multidetector CT imaging of the chest and abdomen was performed following the standard protocol during bolus administration of intravenous contrast. CONTRAST:  1108m ISOVUE-300 IOPAMIDOL (ISOVUE-300) INJECTION 61% [facility]  [facility] FINDINGS: CT CHEST WITH CONTRAST Cardiovascular: Normal heart size. Aortic atherosclerosis. Mild LAD coronary artery calcification also noted. Mediastinum/Lymph Nodes: No pathologically enlarged lymph nodes identified. A feeding tube is seen in place. Small hiatal hernia again seen. Mild wall thickening of distal thoracic esophagus is again demonstrated, without significant change. Lungs/Pleura: No pulmonary mass, infiltrate, or effusion. Mild subpleural scarring and associated peripheral bronchiectasis seen in medial right lower lobe. Musculoskeletal: No chest wall mass or suspicious bone lesions identified. CT ABDOMEN WITH CONTRAST Hepatobiliary: Multiple tiny sub-cm low-attenuation lesions noted in both the right and left hepatic lobes are too small to characterize, but  remain stable since prior CT 12/01/2015.  Gallbladder is unremarkable. Pancreas: No mass, inflammatory changes, or other significant abnormality. Spleen: Within normal limits in size and appearance. Adrenals/Urinary Tract: A 1.3 cm left adrenal nodule remains stable in size since previous study. This showed hypermetabolic activity on prior PET scan, and is indeterminate. Normal appearance of right adrenal gland. Bilateral renal cysts are again seen, however there is no evidence of renal masses or hydronephrosis. Stomach/Bowel: No evidence of obstruction, inflammatory process, or abnormal fluid collections. Vascular/Lymphatic: No pathologically enlarged lymph nodes. No evidence of abdominal aortic aneurysm. Aortic atherosclerosis. Other: None. Musculoskeletal: No suspicious bone lesions identified. Old mild L1 vertebral body compression fracture deformity is stable since prior exam. IMPRESSION: Stable distal thoracic esophageal wall thickening and small hiatal hernia. No evidence of metastatic disease within the thorax. Stable 1.3 cm indeterminate left adrenal nodule. This showed hypermetabolic activity on prior PET scan. Consider continued attention on follow-up CT, or abdomen MRI without and with contrast for further characterization. Tiny sub-cm low-attenuation liver lesions are too small to characterize but remain stable, suggesting benign etiology. These could also be followed by CT, or evaluated further by MRI. Incidentally noted aortic atherosclerosis and coronary artery calcification. Electronically Signed   By: JEarle GellM.D.   On: 03/20/2016 16:40   Ct Abdomen W Contrast  Result Date: 03/20/2016 CLINICAL DATA:  New distal esophageal carcinoma. Status post neoadjuvant radiation therapy and chemotherapy. Restaging. EXAM: CT CHEST AND ABDOMEN WITH CONTRAST TECHNIQUE: Multidetector CT imaging of the chest and abdomen was performed following the standard protocol during bolus administration of intravenous  contrast. CONTRAST:  1050mISOVUE-300 IOPAMIDOL (ISOVUE-300) INJECTION 61% [facility]  [facility] FINDINGS: CT CHEST WITH CONTRAST Cardiovascular: Normal heart size. Aortic atherosclerosis. Mild LAD coronary artery calcification also noted. Mediastinum/Lymph Nodes: No pathologically enlarged lymph nodes identified. A feeding tube is seen in place. Small hiatal hernia again seen. Mild wall thickening of distal thoracic esophagus is again demonstrated, without significant change. Lungs/Pleura: No pulmonary mass,  infiltrate, or effusion. Mild subpleural scarring and associated peripheral bronchiectasis seen in medial right lower lobe. Musculoskeletal: No chest wall mass or suspicious bone lesions identified. CT ABDOMEN WITH CONTRAST Hepatobiliary: Multiple tiny sub-cm low-attenuation lesions noted in both the right and left hepatic lobes are too small to characterize, but remain stable since prior CT 12/01/2015.  Gallbladder is unremarkable. Pancreas: No mass, inflammatory changes, or other significant abnormality. Spleen: Within normal limits in size and appearance. Adrenals/Urinary Tract: A 1.3 cm left adrenal nodule remains stable in size since previous study. This showed hypermetabolic activity on prior PET scan, and is indeterminate. Normal appearance of right adrenal gland. Bilateral renal cysts are again seen, however there is no evidence of renal masses or hydronephrosis. Stomach/Bowel: No evidence of obstruction, inflammatory process, or abnormal fluid collections. Vascular/Lymphatic: No pathologically enlarged lymph nodes. No evidence of abdominal aortic aneurysm. Aortic atherosclerosis. Other: None. Musculoskeletal: No suspicious bone lesions identified. Old mild L1 vertebral body compression fracture deformity is stable since prior exam. IMPRESSION: Stable distal thoracic esophageal wall thickening and small  hiatal hernia. No evidence of metastatic disease within the thorax. Stable 1.3 cm indeterminate left adrenal nodule. This showed hypermetabolic activity on prior PET scan. Consider continued attention on follow-up CT, or abdomen MRI without and with contrast for further characterization. Tiny sub-cm low-attenuation liver lesions are too small to characterize but remain stable, suggesting benign etiology. These could also be followed by CT, or evaluated further by MRI. Incidentally noted aortic atherosclerosis and coronary artery calcification. Electronically Signed   By: Earle Gell M.D.   On: 03/20/2016 16:40   Dg Addison Bailey G Tube Plc W/fl W/rad  Result Date: 02/27/2016 CLINICAL DATA:  Esophageal carcinoma. EXAM: NASO G TUBE PLACEMENT WITH FL AND WITH RAD CONTRAST:  62m ISOVUE-300 IOPAMIDOL (ISOVUE-300) INJECTION 61% FLUOROSCOPY TIME:  Radiation Exposure Index (as provided by the fluoroscopic device): 68.8 COMPARISON:  None. FINDINGS: The existing nasogastric tube was removed over a 185 cm Amplatz wire. 8[place]brand feeding tube was in placed over the Amplatz wire duodenum. 20 cc of Isovue was injected into the feeding tube confirming post pyloric placement of the tip of feeding tube. Procedure image confirms tip of the feeding tube at the junction between the third and fourth portion of the duodenum. IMPRESSION: 1. Successful exchange of nasogastric tube for a Kangroo brand weighted feeding tube. Injection of contrast material through the feeding tube confirms post pyloric placement of the tube with tip at the level of the junction between the third and fourth portion of the duodenum. Electronically Signed   By: TKerby MoorsM.D.   On: 02/27/2016 17:23   Dg Intro Long Gi Tube  Result Date: 03/23/2016 CLINICAL DATA:  Clogged feeding tube.  Tube exchange EXAM: INTRO LONG GI TUBE CONTRAST:  20 cc Omnipaque FLUOROSCOPY TIME:  Fluoroscopy Time:  3 minutes 30 seconds Radiation Exposure Index (if  provided by the fluoroscopic device): 45.4 Number of Acquired Spot Images: 1 COMPARISON:  03/13/2016 FINDINGS: A superstiff Amplatz wire was advanced in the feeding tube to the level of the weighted tip in hopes of un clogging the feeding tube. After several passages of the stiff wire to the tip, hand injected contrast would not pass through the side ports, therefore a decision was made to replace feeding tube. The RIGHT nare was anesthetized with topical gel. The feeding tube was passed through the stomach and into the second portion of the duodenum. Injected contrast confirmed location. The guidewire was removed. The  feeding tube was flushed with 100 cc of water. IMPRESSION: Exchange of feeding tube. Feeding tube advanced to the second portion the duodenum. Electronically Signed   By: Suzy Bouchard M.D.   On: 03/23/2016 15:50   Dg Intro Long Gi Tube  Result Date: 03/13/2016 CLINICAL DATA:  Clogged feeding tube. EXAM: INTRO LONG GI TUBE CONTRAST:  20 cc Isovue 300 FLUOROSCOPY TIME:  Radiation Exposure Index (as provided by the fluoroscopic device): 8.2 mGy If the device does not provide the exposure index: Fluoroscopy Time (in minutes and seconds):  1 minutes and 24 seconds Number of Acquired Images: COMPARISON:  01/07/2016 FINDINGS: The existing feeding tube was removed with subsequent placement of a new feeding tube. The tip was placed at the duodenum jejunal junction. IMPRESSION: Replacement of a clogged feeding to the fourth portion of duodenum. Electronically Signed   By: Marijo Sanes M.D.   On: 03/13/2016 15:13   Dg Intro Long Gi Tube  Result Date: 03/08/2016 CLINICAL DATA:  Clogged feeding tube. EXAM: INTRO LONG GI TUBE CONTRAST:  66m ISOVUE-300 IOPAMIDOL (ISOVUE-300) INJECTION 61% FLUOROSCOPY TIME:  Radiation Exposure Index (as provided by the fluoroscopic device): 41 mGy COMPARISON:  None. FINDINGS: Initially I attempted to pass a 180 cm Amplatz superstiff guidewire through the existing  feeding tube in order to break up the clog. This was unsuccessful. The tube obstruction started at the level of the gastroesophageal junction. Accordingly I remove the existing Panda-type feeding tube. After numbing the right nostril, I introduced a replacement new feeding tube via the right nostril into the transverse duodenum without difficulty. Contrast was injected and an image obtained to confirm correct placement. IMPRESSION: 1. Successful replacement of the clogged feeding tube. 2. Given the recurrent nature of the patient's tube clogging, please note that systems such as the [employer] "Clog Zapper" can be very effective and an inexpensive way to resolve clogged tubes without requiring replacement. Electronically Signed   By: WVan ClinesM.D.   On: 03/08/2016 15:02   EGD by Dr. TAlice Reichert05/30/2017 Impression: -A 6 cm mass was found in the lower third of the esophagus. Multiple biopsies were performed. -A sliding small sized hiatal hernia otherwise normal mucosa in stomach into duodenum.  PET scan 12/05/2015 ([facility]) Impression #1 distal esophagus which she'll hypermetabolic mass consistent with known malignancy #2 hypermetabolic activity in the left anterior prostate gland, correlate for malignancy #3 metabolic density left adrenal nodule. CT attenuation suggest a benign adrenal adenoma which can be moderately hypermetabolic. However given history of malignancy, metastatic disease is not excluded. #4 left apical 2 mm pulmonary nodule, too small for evaluation by PET.  [facility] Impressions: Mass was noted from 27 cm to 35 cm from the in sisters. By EUS criteria and the lesion was T3, there was 1-2 metastatic regional lymph nodes.  ASSESSMENT & PLAN:  75year old male presented with dysphagia with solid food  1. Distal esophageal adenocarcinoma, [id number] reviewed his CT scan, PET scan, EGD, and the biopsy findings with patient and his family member in  details. -The PET scan showed no definitive distant metastasis. 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 This will be followed in the future scan. -By EUS, he has T3 N1 stage III disease. -He completed neoadjuvant radiation with weekly Carbo and Taxol. -He has developed moderate to severe dysphagia and odynophagia, not able to take oral nutrition, has had NJ-tube placed on 02/04/2016,and has clogged frequently which  requires exchange  -We'll continue tube feeding, this will be managed by our dietitian Pamala Hurry. He is currently on tube feeding around the clock, with frequent water flush.  -He has started taking some food by mouth, so far tolerating well, we'll gradually increase.  -lab results reviewed with patient and his wife, he will continue lab monitoring every 2 weeks on Tuesday by home care  -I reviewed his restaging CT scan findings, no definitive evidence of metastasis. There are a few small liver lesions which are indeterminated, I will present her case in our GI tumor board next week, to see if he needs further imaging studies, such as abdominal MRI.  2. HTN -his BP has been normal lately    -continue low dose atenolol   3. Odynophagia and Nausea -He knows to take the Zofran and Compazine as needed for nausea -He will continue oxycodone as needed -Overall improved, but has not resolved, he still not able to take anything by mouth   4. Malnutrition and weight loss -his weight has be fluctuating, overall he has not can much weight back  -He is on tube feeds now, will be managed by our dietitian Pamala Hurry. I  encouraged him to increase the hours of tube feeding, to get more nutrition and weight back.  5. Depression  -Continue mirtazapine   Plan -NJ tube exchange by radiology today -continue tube feeding around the clock, with water flush every hour -I encourage him to gradually increase his oral intake -He will  follow-up with dietitian Pamala Hurry  No need IVF for now  -Lab every other week on Tuesday by home care services  -GI tumor Board discussion next week -He will follow-up with Dr. Pia Mau closely, hopefully to have surgery soon  -I'll see him as needed before surgery.  All questions were answered. The patient knows to call the clinic with any problems, questions or concerns.  I spent 20 minutes counseling the patient face to face. The total time spent in the appointment was 25 minutes and more than 50% was on counseling.     Truitt Merle, MD 03/23/2016

## 2016-03-29 ENCOUNTER — Telehealth: Payer: Self-pay | Admitting: Cardiovascular Disease

## 2016-03-29 ENCOUNTER — Encounter: Payer: Self-pay | Admitting: Cardiology

## 2016-03-29 ENCOUNTER — Ambulatory Visit (INDEPENDENT_AMBULATORY_CARE_PROVIDER_SITE_OTHER): Payer: Medicare Other | Admitting: Cardiology

## 2016-03-29 VITALS — BP 112/66 | HR 85 | Ht 65.0 in | Wt 125.8 lb

## 2016-03-29 DIAGNOSIS — I340 Nonrheumatic mitral (valve) insufficiency: Secondary | ICD-10-CM

## 2016-03-29 DIAGNOSIS — Z01810 Encounter for preprocedural cardiovascular examination: Secondary | ICD-10-CM

## 2016-03-29 DIAGNOSIS — I351 Nonrheumatic aortic (valve) insufficiency: Secondary | ICD-10-CM

## 2016-03-29 NOTE — Telephone Encounter (Signed)
Patient seeing Dr Percival Spanish today at, he has never seen cardiologist in the office

## 2016-03-29 NOTE — Telephone Encounter (Signed)
New Message  Sharee Pimple from Dr. Servando Snare office call wanting to schedule pt appt with provider before 9/14. No appt avail for me to schedule. Pt scheduled for 9/18 with PA for 60 mins.   Request for surgical clearance:  1. What type of surgery is being performed? Surgical resection  2. When is this surgery scheduled? Not dated  3. Are there any medications that need to be held prior to surgery and how long? Sharee Pimple states she does not know   4. Name of physician performing surgery? Lanelle Bal   5. What is your office phone and fax number? [phone] Fax [phone]

## 2016-03-29 NOTE — Progress Notes (Signed)
Cardiology Office Note   Date:  03/29/2016   ID:  Fred [name], DOB 1940-11-25, MRN [id number]  PCP:  [name], MD  Cardiologist:   Minus Breeding, MD   Chief Complaint  Patient presents with  . Pre-op Exam      History of Present Illness: Fred [name] is a 75 y.o. male who presents for preoperative evaluation prior to esophagectomy. 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  He has never had any cardiac issues. He did develop some chest discomfort in May. I do have some records that report from the [place] that he had a negative stress test. He had some mild mitral regurgitation and aortic insufficiency that was going to be followed clinically. He was subsequently found to have esophageal cancer.  Prior to all of this she was doing very well and was very active. He denies any cardiovascular symptoms. He would not get chest pressure, neck or arm discomfort. He would not have shortness of breath, PND or orthopnea. He's not had any palpitations, presyncope or syncope. Of note he's lost 30 pounds since treatment started. He has a nasogastric feeding tube.  Past Medical History:  Diagnosis Date  . Esophageal cancer ([place]) 11/23/15   lower 3rd esohagus   . GERD (gastroesophageal reflux disease)   . Hypertension     Past Surgical History:  Procedure Laterality Date  . cystoscope  4/12/1   prostate  . INGUINAL HERNIA REPAIR    . LEG SURGERY    . PROSTATE BIOPSY  10/05/15  . right shoulder surgery       Current Outpatient Prescriptions  Medication Sig Dispense Refill  . atenolol (TENORMIN) 25 MG tablet Take 12.5 mg by mouth daily.     . Nutritional Supplements (FEEDING SUPPLEMENT, OSMOLITE 1.5 CAL,) LIQD Increase Osmolite 1.5 to 70 mL/hr continuously over 24 hours via Jejunostomy with 50 mL free water flush every hour. Flush with 120 mL water  once daily when changing bag. 1659 mL 0  . oxyCODONE (OXY IR/ROXICODONE) 5 MG immediate release tablet Take 1-2 tablets (5-10 mg total) by mouth every 4 (four) hours as needed for severe pain. 100 tablet 0   No current facility-administered medications for this visit.    Facility-Administered Medications Ordered in Other Visits  Medication Dose Route Frequency Provider Last Rate Last Dose  . 0.9 %  sodium chloride infusion   Intravenous Once Truitt Merle, MD        Allergies:   Review of patient's allergies indicates no known allergies.    Social History:  The patient  reports that he quit smoking about 48 years ago. He has a 10.00 pack-year smoking history. He has never used smokeless tobacco. He reports that he does not drink alcohol or use drugs.   Family History:  The patient's family history includes Alzheimer's disease in his mother; Cancer (age of onset: 72) in his maternal grandfather; Diabetes in his mother; Stroke (age of onset: 52) in his father.    ROS:  Please see the history of present illness.   Otherwise, review of systems are positive for none.   All other systems are reviewed and negative.    PHYSICAL EXAM: VS:  BP 112/66   Pulse 85   Ht 5\' 5"  (1.651 m)   Wt 125 lb 12.8 oz (57.1 kg)   BMI 20.93 kg/m  ,  BMI Body mass index is 20.93 kg/m. GENERAL:  Well appearing HEENT:  Pupils equal round and reactive, fundi not visualized, oral mucosa unremarkable NECK:  No jugular venous distention, waveform within normal limits, carotid upstroke brisk and symmetric, no bruits, no thyromegaly LYMPHATICS:  No cervical, inguinal adenopathy LUNGS:  Clear to auscultation bilaterally BACK:  No CVA tenderness CHEST:  Unremarkable HEART:  PMI not displaced or sustained,S1 and S2 within normal limits, no S3, no S4, no clicks, no rubs, no murmurs ABD:  Flat, positive bowel sounds normal in frequency in pitch, no bruits, no rebound, no guarding, no midline pulsatile mass, no hepatomegaly, no  splenomegaly EXT:  2 plus pulses throughout, no edema, no cyanosis no clubbing SKIN:  No rashes no nodules NEURO:  Cranial nerves II through XII grossly intact, motor grossly intact throughout PSYCH:  Cognitively intact, oriented to person place and time    EKG:  EKG is ordered today. The ekg ordered today demonstrates sinus rhythm, rate 85, left axis deviation, left anterior fascicular block, no acute ST-T wave changes.   Recent Labs: 02/24/2016: ALT 19; BUN 9.6; Creatinine 0.5; HGB 12.0; Platelets 266; Potassium 3.4; Sodium 135    Lipid Panel No results found for: CHOL, TRIG, HDL, CHOLHDL, VLDL, LDLCALC, LDLDIRECT    Wt Readings from Last 3 Encounters:  03/29/16 125 lb 12.8 oz (57.1 kg)  03/20/16 120 lb 8 oz (54.7 kg)  03/06/16 120 lb 14.4 oz (54.8 kg)      Other studies Reviewed: Additional studies/ records that were reviewed today include: [place] records. Review of the above records demonstrates:  Please see elsewhere in the note.     ASSESSMENT AND PLAN:  PREOP:  The patient has no high-risk features or findings. He had no significant symptoms prior to his cancer diagnosis and was very active. Therefore, based on ACC/AHA guidelines, the patient would be at acceptable risk for the planned procedure without further cardiovascular testing.  AI/MR:  These can be followed clinically. No change in therapy is indicated.   Current medicines are reviewed at length with the patient today.  The patient does not have concerns regarding medicines.  The following changes have been made:  no change  Labs/ tests ordered today include: no No orders of the defined types were placed in this encounter.   Disposition:   FU with me as needed.      Signed, Minus Breeding, MD  03/29/2016 5:22 PM    [facility]

## 2016-03-29 NOTE — Patient Instructions (Signed)
Your physician recommends that you schedule a follow-up appointment in: As Needed    

## 2016-04-02 ENCOUNTER — Telehealth: Payer: Self-pay | Admitting: *Deleted

## 2016-04-02 ENCOUNTER — Ambulatory Visit (HOSPITAL_COMMUNITY)
Admission: RE | Admit: 2016-04-02 | Discharge: 2016-04-02 | Disposition: A | Discharge status: Home / Self Care | Payer: No Typology Code available for payment source | Source: Ambulatory Visit | Attending: Hematology | Admitting: Hematology

## 2016-04-02 DIAGNOSIS — C155 Malignant neoplasm of lower third of esophagus: Secondary | ICD-10-CM | POA: Insufficient documentation

## 2016-04-02 DIAGNOSIS — Z4659 Encounter for fitting and adjustment of other gastrointestinal appliance and device: Secondary | ICD-10-CM | POA: Diagnosis not present

## 2016-04-02 NOTE — Telephone Encounter (Signed)
Received call from wife this am stating that feeding tube clogged on Sat & req replacement.  She states that he is not eating much & surgeon wants him to gain some weight.  She reports he has lost weight this weekend & he is eating very little.  Discussed with Dr Burr Medico & called central scheduling to schedule & ask that they call pt.

## 2016-04-05 ENCOUNTER — Other Ambulatory Visit: Payer: Self-pay | Admitting: *Deleted

## 2016-04-05 ENCOUNTER — Ambulatory Visit (INDEPENDENT_AMBULATORY_CARE_PROVIDER_SITE_OTHER): Payer: No Typology Code available for payment source | Admitting: Cardiothoracic Surgery

## 2016-04-05 ENCOUNTER — Encounter: Payer: Self-pay | Admitting: Cardiothoracic Surgery

## 2016-04-05 VITALS — BP 101/61 | HR 90 | Resp 20 | Ht 65.0 in | Wt 125.8 lb

## 2016-04-05 DIAGNOSIS — C159 Malignant neoplasm of esophagus, unspecified: Secondary | ICD-10-CM

## 2016-04-05 DIAGNOSIS — C155 Malignant neoplasm of lower third of esophagus: Secondary | ICD-10-CM

## 2016-04-05 DIAGNOSIS — C787 Secondary malignant neoplasm of liver and intrahepatic bile duct: Secondary | ICD-10-CM

## 2016-04-05 NOTE — Progress Notes (Signed)
[address]       [place] [address]             [phone]                    Vertis Tschirhart [place] Medical Record #[id number] Date of Birth: 1940/12/31  Referring: Everrett Coombe, MD Primary Care: [name], MD Cardiology: Dr Percival Spanish   Chief Complaint:    Chief Complaint  Patient presents with  . Esophageal Cancer    2 week f/u    History of Present Illness:    Fred [name] 75 y.o. male was originally seen in the office at his request for a second surgical opinion concerning his  diagnosed adenocarcinoma the distal esophagus. The patient gives a history of many years of reflux symptoms taking acid blocking medications. For  3 months he had noted increasing difficulty swallowing specially solid food and bread. He was referred by the [facility] to [facility] were upper GI endoscopy was performed a near obstructing mass 6 cm lower third of the esophagus was noted. 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 By EUS criteria the lesion was T3 with breaks into the muscularis propria total thickness 9 mm, there was present regional lymph nodes, staged N1. . The patient Completed radiation and chemotherapy at [facility] .   The patient completed radiation therapy July 13. He continues to have problems with nutrition. Currently having tube feedings continuously by NG tube. He has been very frustrated by this with the tube clotting.   Wt Readings from Last 3 Encounters:  04/05/16 125 lb 12.8 oz (57.1 kg)  03/29/16 125 lb 12.8 oz (57.1 kg)  03/20/16 120 lb 8 oz (54.7 kg)     There is a family history of gastric cancer in his maternal grandfather, patient's  father died of a stroke in his 43s mother died in her 10s of Alzheimer.   Patient notes that when his symptoms first began he had chest discomfort and was referred to the [facility] ER from the [facility], he notes a stress test and echocardiogram were done at the [place] per his report he was told he had no cardiac issues- his wife will assist in obtaining these records. Patient saw Dr. Percival Spanish  last week for preop cardiac clearance, no further evaluation was thought to be needed by him.  Current Activity/ Functional Status:  Patient is independent with mobility/ambulation, transfers, ADL's, IADL's.   Zubrod Score: At the time of surgery this patient's most appropriate activity status/level should be described as: []     0    Normal activity, no symptoms [x]     1    Restricted in physical strenuous activity but ambulatory, able to do out light work []     2    Ambulatory and capable of self care, unable to do work activities, up and about               >50 % of waking hours                              []     3  Only limited self care, in bed greater than 50% of waking hours []     4    Completely disabled, no self care, confined to bed or chair []     5    Moribund   Past Medical History:  Diagnosis Date  . Esophageal cancer ([place]) 11/23/15   lower 3rd esohagus   . GERD (gastroesophageal reflux disease)   . Hypertension     Past Surgical History:  Procedure Laterality Date  . cystoscope  4/12/1   prostate  . INGUINAL HERNIA REPAIR    . LEG SURGERY    . PROSTATE BIOPSY  10/05/15  . right shoulder surgery      Family History  Problem Relation Age of Onset  . Cancer Maternal Grandfather 92    gastric cancer   . Alzheimer's disease Mother   . Diabetes Mother   . Stroke Father 65   Stomach cancer Maternal Grandfather   Social History   Social History  . Marital status: Married    Spouse name: N/A  . Number of children: 1  . Years of education: N/A   Occupational History  .  Truck Geophysicist/field seismologist    Social History Main Topics  . Smoking status: Former Smoker    Packs/day: 1.00    Years: 10.00    Quit date: 07/24/1967  . Smokeless tobacco: Never Used  . Alcohol use No  . Drug use: No  . Sexual activity: Not Currently   Other Topics Concern  . Not on file   Social History Narrative  . No narrative on file    History  Smoking Status  . Former Smoker  . Packs/day: 1.00  . Years: 10.00  . Quit date: 07/24/1967  Smokeless Tobacco  . Never Used    History  Alcohol Use No     No Known Allergies  Current Outpatient Prescriptions  Medication Sig Dispense Refill  . atenolol (TENORMIN) 25 MG tablet Take 12.5 mg by mouth daily.     . Nutritional Supplements (FEEDING SUPPLEMENT, OSMOLITE 1.5 CAL,) LIQD Increase Osmolite 1.5 to 70 mL/hr continuously over 24 hours via Jejunostomy with 50 mL free water flush every hour. Flush with 120 mL water once daily when changing bag. 1659 mL 0  . oxyCODONE (OXY IR/ROXICODONE) 5 MG immediate release tablet Take 1-2 tablets (5-10 mg total) by mouth every 4 (four) hours as needed for severe pain. 100 tablet 0   No current facility-administered medications for this visit.    Facility-Administered Medications Ordered in Other Visits  Medication Dose Route Frequency Provider Last Rate Last Dose  . 0.9 %  sodium chloride infusion   Intravenous Once Truitt Merle, MD          Review of Systems:     Cardiac Review of Systems: Y or N  Chest Pain [y evaluated at [facility] last month ]  Resting SOB [ n  ] Exertional SOB  [n  ]  Orthopnea [ n ]   Pedal Edema [ n  ]    Palpitations [ n ] Syncope  [n  ]   Presyncope [ n  ]  General Review of Systems: [Y] = yes [  ]=no Constitional: recent weight change [  ];  Wt loss over the last 3 months [   ] anorexia [  ]; fatigue [  ]; nausea [  ]; night sweats [  ]; fever [  ]; or chills [  ];  Dental: poor dentition[  ]; Last Dentist visit:   Eye : blurred vision [  ]; diplopia [   ]; vision  changes [  ];  Amaurosis fugax[  ]; Resp: cough [  ];  wheezing[  ];  hemoptysis[  ]; shortness of breath[  ]; paroxysmal nocturnal dyspnea[  ]; dyspnea on exertion[  ]; or orthopnea[  ];  GI:  gallstones[  ], vomiting[  ];  dysphagia[ y ]; [place] Muta  ];  hematochezia [  ]; heartburn[  ];   Hx of  Colonoscopy[ y ]; GU: kidney stones [  ]; hematuria[  ];   dysuria [  ];  nocturia[  ];  history of     obstruction [  ]; urinary frequency Blue.Reese  ]             Skin: rash, swelling[  ];, hair loss[  ];  peripheral edema[  ];  or itching[  ]; Musculosketetal: myalgias[  ];  joint swelling[  ];  joint erythema[  ];  joint pain[  ];  back pain[  ];  Heme/Lymph: bruising[  ];  bleeding[  ];  anemia[  ];  Neuro: TIA[  ];  headaches[  ];  stroke[  ];  vertigo[  ];  seizures[  ];   paresthesias[  ];  difficulty walking[chronic injury to left leg  ];  Psych:depression[n  ]; anxiety[ n ];  Endocrine: diabetes[  ];  thyroid dysfunction[  ];  Immunizations: Flu up to date [ y]; Pneumococcal up to date [  y];  Other:  Physical Exam: BP 101/61 (BP Location: Right Arm, Patient Position: Sitting, Cuff Size: Normal)   Pulse 90   Resp 20   Ht 5' 5"  (1.651 m)   Wt 125 lb 12.8 oz (57.1 kg)   SpO2 98%   BMI 20.93 kg/m   Wt Readings from Last 3 Encounters:  04/05/16 125 lb 12.8 oz (57.1 kg)  03/29/16 125 lb 12.8 oz (57.1 kg)  03/20/16 120 lb 8 oz (54.7 kg)   PHYSICAL EXAMINATION: General appearance: alert, cooperative and no distress, NG Has been placed with a pen to 2.  Head: Normocephalic, without obvious abnormality, atraumatic Neck: no adenopathy, no carotid bruit, no JVD, supple, symmetrical, trachea midline and thyroid not enlarged, symmetric, no tenderness/mass/nodules Lymph nodes: Cervical, supraclavicular, and axillary nodes normal. Resp: clear to auscultation bilaterally Back: symmetric, no curvature. ROM normal. No CVA tenderness. Cardio: regular rate and rhythm, S1, S2 normal, no murmur, click, rub  or gallop GI: soft, non-tender; bowel sounds normal; no masses,  no organomegaly Extremities: extremities normal, atraumatic, no cyanosis or edema Neurologic: Grossly normal  Diagnostic Studies & Laboratory data:  Ct Chest W Contrast & Ct Abdomen W Contrast  Result Date: 03/20/2016 CLINICAL DATA:  New distal esophageal carcinoma. Status post neoadjuvant radiation therapy and chemotherapy. Restaging. EXAM: CT CHEST AND ABDOMEN WITH CONTRAST TECHNIQUE: Multidetector CT imaging of the chest and abdomen was performed following the standard protocol during bolus administration of intravenous contrast. CONTRAST:  159m ISOVUE-300 IOPAMIDOL (ISOVUE-300) INJECTION 61% COMPAR[facility]:  [facility] FINDINGS: CT CHEST WITH CONTRAST Cardiovascular: Normal heart size. Aortic atherosclerosis. Mild LAD coronary artery calcification also noted. Mediastinum/Lymph Nodes: No pathologically enlarged lymph nodes identified. A feeding tube is seen in place. Small hiatal hernia again seen. Mild wall thickening of distal thoracic esophagus is again demonstrated, without significant change. Lungs/Pleura: No pulmonary mass, infiltrate,  or effusion. Mild subpleural scarring and associated peripheral bronchiectasis seen in medial right lower lobe. Musculoskeletal: No chest wall mass or suspicious bone lesions identified. CT ABDOMEN WITH CONTRAST Hepatobiliary: Multiple tiny sub-cm low-attenuation lesions noted in both the right and left hepatic lobes are too small to characterize, but remain stable since prior CT 12/01/2015.  Gallbladder is unremarkable. Pancreas: No mass, inflammatory changes, or other significant abnormality. Spleen: Within normal limits in size and appearance. Adrenals/Urinary Tract: A 1.3 cm left adrenal nodule remains stable in size since previous study. This showed hypermetabolic activity on prior PET scan, and is  indeterminate. Normal appearance of right adrenal gland. Bilateral renal cysts are again seen, however there is no evidence of renal masses or hydronephrosis. Stomach/Bowel: No evidence of obstruction, inflammatory process, or abnormal fluid collections. Vascular/Lymphatic: No pathologically enlarged lymph nodes. No evidence of abdominal aortic aneurysm. Aortic atherosclerosis. Other: None. Musculoskeletal: No suspicious bone lesions identified. Old mild L1 vertebral body compression fracture deformity is stable since prior exam. IMPRESSION: Stable distal thoracic esophageal wall thickening and small hiatal hernia. No evidence of metastatic disease within the thorax. Stable 1.3 cm indeterminate left adrenal nodule. This showed hypermetabolic activity on prior PET scan. Consider continued attention on follow-up CT, or abdomen MRI without and with contrast for further characterization. Tiny sub-cm low-attenuation liver lesions are too small to characterize but remain stable, suggesting benign etiology. These could also be followed by CT, or evaluated further by MRI. Incidentally noted aortic atherosclerosis and coronary artery calcification. Electronically Signed   By: Earle Gell M.D.   On: 03/20/2016 16:40     Dg Abd 1 View  Result Date: 02/23/2016 CLINICAL DATA:  Evaluate feeding tube placement, history of esophageal carcinoma EXAM: ABDOMEN - 1 VIEW COMPARISON:  Abdomen film of 02/08/2016 FINDINGS: The NG tube does not appear to be changed in position. The tube probably courses into the duodenum extending into the proximal jejunum within the left upper quadrant. The bowel gas pattern is nonspecific the lung bases appear clear. IMPRESSION: NG tube tip is within the left upper quadrant presumably within proximal jejunum. Electronically Signed   By: Ivar Drape M.D.   On: 02/23/2016 14:38   Recent Lab Findings: Lab Results  Component Value Date   WBC 7.6 02/24/2016   HGB 12.0 (L) 02/24/2016   HCT 34.7 (L)  02/24/2016   PLT 266 02/24/2016   GLUCOSE 106 02/24/2016   ALT 19 02/24/2016   AST 22 02/24/2016   NA 135 (L) 02/24/2016   K 3.4 (L) 02/24/2016   CREATININE 0.5 (L) 02/24/2016   BUN 9.6 02/24/2016   CO2 26 02/24/2016   CT A/P with contrast 12/01/2015 Marked circumferential mid to distal esophageal wall thickening, no enlarged thoracic nodes or evidence of lung mets Small varices along the GE junction Periportal nodes measuring less than a centimeter. There is prominent upper abdominal node adjacent to the splenic vein/SMV confluence measuring 15x49m 13 mm left adrenal indeterminate nodule. Hypodense scattered liver lesions, the largest measuring 8 mm in the right, too small to characterize Enlarged heterogeneously enhancing prostate with ill-defined margins  12/15/2015 EGD/EUS 12/15/2015 Mass noted at 27CM from the incisors, by EUS criteria the lesion is T3 with breaks in the muscularis propria, total thickness 9 mm. Based on the presene of 1-2 metastatic regional lymph nodes, N staging was N1. The radial EUS would not pass through and did not dilate given clinical information already gleaned.  PATHOLOGY: Lower third of esophagus mass biopsy 11/23/2015 Mucinous adenocarcinoma,  moderately differentiated.   PET:  IMPRESSION:  1.  Distal esophageal hypermetabolic mass consistent with known malignancy. 2.  Hypermetabolic activity in the left anterior prostate gland, correlate for malignancy. 3.  Metabolic density left adrenal nodule. 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 However, given history of malignancy, metastatic disease is not excluded. 4.  Left apical 2 mm pulmonary nodule, too small for evaluation by PET.   Result Narrative  INDICATION: C15.5: Malignant neoplasm of lower third of esophagus  TECHNIQUE: 7.1 millicuries of T-62 FDG was administered intravenously. PET imaging was obtained from the skull base to the mid thighs. CT images  were obtained for attenuation correction and localization purposes. Glucose level was 103 MG/DL. Time from  injection to scan was 62 minutes.  Comparison none.  FINDINGS:  Hypermetabolic distal esophageal mass max SUV 8.5 (image 118) consistent with known malignancy.  Hypermetabolic activity within the left anterior prostate Max SUV 19.8, no mass identified by CT.  Low density left adrenal nodule measuring 1.4 cm (image 146). 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  Remaining activity in the body is normal and physiologic. Additional findings: Left apical 2 mm pulmonary nodule (image 66) 2 small for evaluation by PET. Gallbladder sludge. No wall thickening. Bilateral renal cysts. Nonobstructing mid left renal stone. Colonic diverticulosis   I have independently reviewed the above radiology studies  and reviewed the findings with the patient.  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 He's been cleared by cardiology With known advanced stage esophageal cancer, we will obtain a MRI of the liver and adrenal to ensure the he does not have stage IV disease prior to surgery. He is taking up by mouth feedings better now I discussed with his family that if this tube becomes clogged again will remove it and see how he does on the  by mouth diet with supplements. He will obtain pulmonary function studies this week I'll plan to see him back in one week to review his MRI with him and check his weight and decide on a surgical date in late September or early October.    esophagectomy following completion of radiation and chemotherapy. Patient has gained some weight and strength with aggressive tube feeding management.  [name] MD      [address] [place] [address] Office [phone]   [place]eeper [phone]  04/05/2016 9:59 AM

## 2016-04-10 ENCOUNTER — Ambulatory Visit: Payer: No Typology Code available for payment source | Admitting: Physician Assistant

## 2016-04-10 ENCOUNTER — Ambulatory Visit
Admission: RE | Admit: 2016-04-10 | Discharge: 2016-04-10 | Disposition: A | Discharge status: Home / Self Care | Payer: Medicare Other | Source: Ambulatory Visit | Attending: Cardiothoracic Surgery | Admitting: Cardiothoracic Surgery

## 2016-04-10 DIAGNOSIS — C155 Malignant neoplasm of lower third of esophagus: Secondary | ICD-10-CM

## 2016-04-10 DIAGNOSIS — C787 Secondary malignant neoplasm of liver and intrahepatic bile duct: Secondary | ICD-10-CM

## 2016-04-10 MED ORDER — GADOBENATE DIMEGLUMINE 529 MG/ML IV SOLN
11.0000 mL | Freq: Once | INTRAVENOUS | Status: AC | PRN
  Administered 2016-04-10: 11 mL via INTRAVENOUS

## 2016-04-11 ENCOUNTER — Ambulatory Visit (HOSPITAL_COMMUNITY)
Admission: RE | Admit: 2016-04-11 | Discharge: 2016-04-11 | Disposition: A | Discharge status: Home / Self Care | Payer: No Typology Code available for payment source | Source: Ambulatory Visit | Attending: Cardiothoracic Surgery | Admitting: Cardiothoracic Surgery

## 2016-04-11 DIAGNOSIS — C159 Malignant neoplasm of esophagus, unspecified: Secondary | ICD-10-CM | POA: Diagnosis present

## 2016-04-11 DIAGNOSIS — J449 Chronic obstructive pulmonary disease, unspecified: Secondary | ICD-10-CM | POA: Insufficient documentation

## 2016-04-11 LAB — PULMONARY FUNCTION TEST
DL/VA % pred: 100 %
DL/VA: 4.22 ml/min/mmHg/L
DLCO unc % pred: 81 %
DLCO unc: 20.27 ml/min/mmHg
FEF 25-75 Post: 2.16 L/sec
FEF 25-75 Pre: 1.58 L/sec
FEF2575-%Change-Post: 36 %
FEF2575-%Pred-Post: 128 %
FEF2575-%Pred-Pre: 94 %
FEV1-%Change-Post: 6 %
FEV1-%Pred-Post: 109 %
FEV1-%Pred-Pre: 102 %
FEV1-Post: 2.58 L
FEV1-Pre: 2.42 L
FEV1FVC-%Change-Post: 4 %
FEV1FVC-%Pred-Pre: 97 %
FEV6-%Change-Post: 2 %
FEV6-%Pred-Post: 113 %
FEV6-%Pred-Pre: 110 %
FEV6-Post: 3.46 L
FEV6-Pre: 3.38 L
FEV6FVC-%Change-Post: 1 %
FEV6FVC-%Pred-Post: 107 %
FEV6FVC-%Pred-Pre: 106 %
FVC-%Change-Post: 2 %
FVC-%Pred-Post: 106 %
FVC-%Pred-Pre: 104 %
FVC-Post: 3.52 L
FVC-Pre: 3.44 L
Post FEV1/FVC ratio: 73 %
Post FEV6/FVC ratio: 100 %
Pre FEV1/FVC ratio: 70 %
Pre FEV6/FVC Ratio: 99 %
RV % pred: 65 %
RV: 1.49 L
TLC % pred: 81 %
TLC: 4.86 L

## 2016-04-11 MED ORDER — ALBUTEROL SULFATE (2.5 MG/3ML) 0.083% IN NEBU
2.5000 mg | INHALATION_SOLUTION | Freq: Once | RESPIRATORY_TRACT | Status: AC
  Administered 2016-04-11: 2.5 mg via RESPIRATORY_TRACT

## 2016-04-12 ENCOUNTER — Ambulatory Visit (INDEPENDENT_AMBULATORY_CARE_PROVIDER_SITE_OTHER): Payer: Non-veteran care | Admitting: Cardiothoracic Surgery

## 2016-04-12 ENCOUNTER — Encounter: Payer: Non-veteran care | Admitting: Cardiothoracic Surgery

## 2016-04-12 ENCOUNTER — Encounter: Payer: Self-pay | Admitting: Cardiothoracic Surgery

## 2016-04-12 VITALS — BP 119/70 | HR 98 | Resp 20 | Ht 65.0 in | Wt 125.4 lb

## 2016-04-12 DIAGNOSIS — C155 Malignant neoplasm of lower third of esophagus: Secondary | ICD-10-CM

## 2016-04-12 NOTE — Patient Instructions (Signed)
Preparation for your surgery: Bowel Prep  You will need to undergo a bowel prep starting 2 days prior to your surgery.  You will start clear liquids mid-day September 30  and all day Oct 1.  Nothing to eat to drink after OCT2  at midnight.  Clear liquid diet:  This includes broths (vegetable, chicken or beef), juices (grape, apple, or cranberry), Jell-O, popsicles, coffee or tea (without milk or cream) and Gatorade. Hard candy and gum are OK. Avoid alcohol and all solid food. You should take in at LEAST 10 cups of fluid this day. In addition to the clear liquid diet (that you will remain on the entire day).  You will also need to drink ONE 10 ounce bottle of Magnesium Citrate on oct 1 . It is best if you can drink it mid-morning, around 10 am (you can buy this over the counter at your neighborhood pharmacy).

## 2016-04-12 NOTE — Progress Notes (Signed)
[address]       ,[place] [address]             [phone]                    Joan Carrigg [place] Medical Record #[id number] Date of Birth: [birth year]  Referring: Everrett Coombe, MD Primary Care: [name], MD Cardiology: Dr Percival Spanish   Chief Complaint:    Chief Complaint  Patient presents with  . Esophageal Cancer    1 week f/u review MRA liver and check weight    History of Present Illness:    Fred [name] 75 y.o. male was originally seen in the office at his request for a second surgical opinion concerning his  diagnosed adenocarcinoma the distal esophagus. The patient gives a history of many years of reflux symptoms taking acid blocking medications. For  3 months he had noted increasing difficulty swallowing specially solid food and bread. He was referred by the [facility] : [facility] to [place] were upper GI endoscopy was performed a near obstructing mass 6 cm lower third of the esophagus was noted. 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 By EUS criteria the lesion was T3 with breaks into the muscularis propria total thickness 9 mm, there was present regional lymph nodes, staged N1. . The patient Completed radiation and chemotherapy at [facility] . In   The patient completed radiation therapy July 13. He became catabolic losing weight and ultimately required placement of a panda feeding tube.  He comes to the office today feeling much better to the point he has been out mowing his lawn. His by mouth intake over the past 2 weeks has significantly increased to the point on his last visit I told him if his feeding tube clogged to just remove it, which he has  done and continues on a by mouth diet .   Wt Readings from Last 3 Encounters:  04/12/16 125 lb 6.4 oz (56.9 kg)  04/05/16 125 lb 12.8 oz (57.1 kg)  03/29/16 125 lb 12.8 oz (57.1 kg)     There is a family history of gastric cancer in his maternal grandfather, patient's father died of a stroke in his 16s mother died in her 48s of Alzheimer.   Patient notes that when his symptoms first began he had chest discomfort and was referred to the [facility] ER from the [facility], he notes a stress test and echocardiogram were done at the [place] per his report he was told he had no cardiac issues- his wife will assist in obtaining these records. Patient saw Dr. Percival Spanish  last week for preop cardiac clearance, no further evaluation was thought to be needed by him.  Because of questionable abnormalities in his liver and left adrenal and MRI of the liver and adrenals was done this past week. The patient comes in today for reevaluation of his nutritional status and to plan surgical resection.  Current Activity/ Functional Status:  Patient is independent with mobility/ambulation, transfers, ADL's, IADL's.   Zubrod Score: At the time of surgery this patient's most appropriate activity status/level should be described as: []     0    Normal activity, no symptoms [x]   1    Restricted in physical strenuous activity but ambulatory, able to do out light work []     2    Ambulatory and capable of self care, unable to do work activities, up and about               >50 % of waking hours                              []     3    Only limited self care, in bed greater than 50% of waking hours []     4    Completely disabled, no self care, confined to bed or chair []     5    Moribund   Past Medical History:  Diagnosis Date  . Esophageal cancer ([place]) 11/23/15   lower 3rd esohagus   . GERD (gastroesophageal reflux disease)   . Hypertension     Past Surgical History:  Procedure Laterality Date  . cystoscope  4/12/1    prostate  . INGUINAL HERNIA REPAIR    . LEG SURGERY    . PROSTATE BIOPSY  10/05/15  . right shoulder surgery      Family History  Problem Relation Age of Onset  . Cancer Maternal Grandfather 33    gastric cancer   . Alzheimer's disease Mother   . Diabetes Mother   . Stroke Father 90   Stomach cancer Maternal Grandfather   Social History   Social History  . Marital status: Married    Spouse name: N/A  . Number of children: 1  . Years of education: N/A   Occupational History  . Truck Geophysicist/field seismologist    Social History Main Topics  . Smoking status: Former Smoker    Packs/day: 1.00    Years: 10.00    Quit date: 07/24/1967  . Smokeless tobacco: Never Used  . Alcohol use No  . Drug use: No  . Sexual activity: Not Currently   Other Topics Concern  . Not on file   Social History Narrative  . No narrative on file    History  Smoking Status  . Former Smoker  . Packs/day: 1.00  . Years: 10.00  . Quit date: 07/24/1967  Smokeless Tobacco  . Never Used    History  Alcohol Use No     No Known Allergies  Current Outpatient Prescriptions  Medication Sig Dispense Refill  . atenolol (TENORMIN) 25 MG tablet Take 12.5 mg by mouth daily.     Marland Kitchen oxyCODONE (OXY IR/ROXICODONE) 5 MG immediate release tablet Take 1-2 tablets (5-10 mg total) by mouth every 4 (four) hours as needed for severe pain. 100 tablet 0  . Nutritional Supplements (FEEDING SUPPLEMENT, OSMOLITE 1.5 CAL,) LIQD Increase Osmolite 1.5 to 70 mL/hr continuously over 24 hours via Jejunostomy with 50 mL free water flush every hour. Flush with 120 mL water once daily when changing bag. (Patient not taking: Reported on 04/12/2016) 1659 mL 0   No current facility-administered medications for this visit.    Facility-Administered Medications Ordered in Other Visits  Medication Dose Route Frequency Provider Last Rate Last Dose  . 0.9 %  sodium chloride infusion   Intravenous Once Truitt Merle, MD          Review of Systems:       Cardiac Review of Systems: Y or N  Chest Pain [y evaluated at [place] last month ]  Resting SOB [ n  ]  Exertional SOB  [n  ]  Orthopnea [ n ]   Pedal Edema [ n  ]    Palpitations [ n ] Syncope  [n  ]   Presyncope [ n  ]  General Review of Systems: [Y] = yes [  ]=no Constitional: recent weight change [  ];  Wt loss over the last 3 months [   ] anorexia [  ]; fatigue [  ]; nausea [  ]; night sweats [  ]; fever [  ]; or chills [  ];          Dental: poor dentition[  ]; Last Dentist visit:   Eye : blurred vision [  ]; diplopia [   ]; vision changes [  ];  Amaurosis fugax[  ]; Resp: cough [  ];  wheezing[  ];  hemoptysis[  ]; shortness of breath[  ]; paroxysmal nocturnal dyspnea[  ]; dyspnea on exertion[  ]; or orthopnea[  ];  GI:  gallstones[  ], vomiting[  ];  dysphagia[ y ]; [place] Muta  ];  hematochezia [  ]; heartburn[  ];   Hx of  Colonoscopy[ y ]; GU: kidney stones [  ]; hematuria[  ];   dysuria [  ];  nocturia[  ];  history of     obstruction [  ]; urinary frequency Blue.Reese  ]             Skin: rash, swelling[  ];, hair loss[  ];  peripheral edema[  ];  or itching[  ]; Musculosketetal: myalgias[  ];  joint swelling[  ];  joint erythema[  ];  joint pain[  ];  back pain[  ];  Heme/Lymph: bruising[  ];  bleeding[  ];  anemia[  ];  Neuro: TIA[  ];  headaches[  ];  stroke[  ];  vertigo[  ];  seizures[  ];   paresthesias[  ];  difficulty walking[chronic injury to left leg  ];  Psych:depression[n  ]; anxiety[ n ];  Endocrine: diabetes[  ];  thyroid dysfunction[  ];  Immunizations: Flu up to date [ y]; Pneumococcal up to date [  y];  Other:  Physical Exam: BP 119/70 (BP Location: Right Arm, Patient Position: Sitting, Cuff Size: Small)   Pulse 98   Resp 20   Ht 5' 5"  (1.651 m)   Wt 125 lb 6.4 oz (56.9 kg)   SpO2 99% Comment: RA  BMI 20.87 kg/m   Wt Readings from Last 3 Encounters:  04/12/16 125 lb 6.4 oz (56.9 kg)  04/05/16 125 lb 12.8 oz (57.1 kg)  03/29/16 125 lb 12.8 oz (57.1 kg)   PHYSICAL  EXAMINATION: Overall the patient looks much better, no feeding tube in place General appearance: alert, cooperative and no distress,   Head: Normocephalic, without obvious abnormality, atraumatic Neck: no adenopathy, no carotid bruit, no JVD, supple, symmetrical, trachea midline and thyroid not enlarged, symmetric, no tenderness/mass/nodules Lymph nodes: Cervical, supraclavicular, and axillary nodes normal. Resp: clear to auscultation bilaterally Back: symmetric, no curvature. ROM normal. No CVA tenderness. Cardio: regular rate and rhythm, S1, S2 normal, no murmur, click, rub or gallop GI: soft, non-tender; bowel sounds normal; no masses,  no organomegaly Extremities: extremities normal, atraumatic, no cyanosis or edema Neurologic: Grossly normal  Diagnostic Studies & Laboratory data:  Ct Chest W Contrast & Ct Abdomen W Contrast  Result Date: 03/20/2016 CLINICAL DATA:  New distal esophageal carcinoma. Status post neoadjuvant radiation therapy and chemotherapy. Restaging. EXAM: CT CHEST AND ABDOMEN  WITH CONTRAST TECHNIQUE: Multidetector CT imaging of the chest and abdomen was performed following the standard protocol during bolus administration of intravenous contrast. CONTRAST:  176m ISOVUE-300 IOPAMIDOL (ISOVUE-300) INJECTION 61% COM[facility]:  [facility] FINDINGS: CT CHEST WITH CONTRAST Cardiovascular: Normal heart size. Aortic atherosclerosis. Mild LAD coronary artery calcification also noted. Mediastinum/Lymph Nodes: No pathologically enlarged lymph nodes identified. A feeding tube is seen in place. Small hiatal hernia again seen. Mild wall thickening of distal thoracic esophagus is again demonstrated, without significant change. Lungs/Pleura: No pulmonary mass, infiltrate, or effusion. Mild subpleural scarring and associated peripheral bronchiectasis seen in medial right lower lobe.  Musculoskeletal: No chest wall mass or suspicious bone lesions identified. CT ABDOMEN WITH CONTRAST Hepatobiliary: Multiple tiny sub-cm low-attenuation lesions noted in both the right and left hepatic lobes are too small to characterize, but remain stable since prior CT 12/01/2015.  Gallbladder is unremarkable. Pancreas: No mass, inflammatory changes, or other significant abnormality. Spleen: Within normal limits in size and appearance. Adrenals/Urinary Tract: A 1.3 cm left adrenal nodule remains stable in size since previous study. This showed hypermetabolic activity on prior PET scan, and is indeterminate. Normal appearance of right adrenal gland. Bilateral renal cysts are again seen, however there is no evidence of renal masses or hydronephrosis. Stomach/Bowel: No evidence of obstruction, inflammatory process, or abnormal fluid collections. Vascular/Lymphatic: No pathologically enlarged lymph nodes. No evidence of abdominal aortic aneurysm. Aortic atherosclerosis. Other: None. Musculoskeletal: No suspicious bone lesions identified. Old mild L1 vertebral body compression fracture deformity is stable since prior exam. IMPRESSION: Stable distal thoracic esophageal wall thickening and small hiatal hernia. No evidence of metastatic disease within the thorax. Stable 1.3 cm indeterminate left adrenal nodule. This showed hypermetabolic activity on prior PET scan. Consider continued attention on follow-up CT, or abdomen MRI without and with contrast for further characterization. Tiny sub-cm low-attenuation liver lesions are too small to characterize but remain stable, suggesting benign etiology. These could also be followed by CT, or evaluated further by MRI. Incidentally noted aortic atherosclerosis and coronary artery calcification. Electronically Signed   By: JEarle GellM.D.   On: 03/20/2016 16:40   Mr Liver W Wo Contrast  Result Date: 04/10/2016 CLINICAL DATA:  Esophageal cancer, possible liver lesion on CT EXAM:  MRI ABDOMEN WITHOUT AND WITH CONTRAST TECHNIQUE: Multiplanar multisequence MR imaging of the abdomen was performed both before and after the administration of intravenous contrast. CONTRAST:  169mMULTIHANCE GADOBENATE DIMEGLUMINE 529 MG/ML IV SOLN COMPARISON:  CT abdomen/pelvis dated 03/20/2016 FINDINGS: Lower chest: Lung bases are clear. Hepatobiliary: Scattered benign hepatic cysts measuring up to 6 mm in the right hepatic lobe (series 8/image 6). 8 mm lesion in segment 7 (series 8/image 9), with suspected peripheral nodular discontinuous enhancement (series 12/image 22), suggesting a benign hemangioma. Additional 10 mm T2 hyperintense lesion in segment 6 (series 8/image 13), with associated perfusion anomaly on the early arterial phase (series 12/image 33), and persistent central enhancement (series 19/image 35), possibly reflecting a hemangioma or vascular malformation. Metastatic disease is considered unlikely but is not entirely excluded. Gallbladder is unremarkable. No intrahepatic or extrahepatic ductal dilatation. Pancreas: 8 mm nonaggressive pancreatic cyst along the pancreatic tail (series 8/image 15). No pancreatic atrophy or ductal dilatation. Spleen:  Within normal limits. Adrenals/Urinary Tract: 12 mm left adrenal nodule (series 8/ image 15), unchanged. No definite intracellular lipid to suggest a benign adenoma. Right adrenal gland is within normal limits. Scattered bilateral  renal cysts, including a 4.4 cm left upper pole renal cyst (series 8/ image 14) and a 5.9 cm right lower pole renal cyst (series 8/ image 26). No hydronephrosis. Stomach/Bowel: Stomach is notable for a tiny hiatal hernia. Visualized bowel is unremarkable. Vascular/Lymphatic:  No evidence of abdominal aortic aneurysm. No suspicious abdominal lymphadenopathy. Other:  No abdominal ascites. Musculoskeletal: No focal osseous lesions. IMPRESSION: 10 mm enhancing lesion in segment 6 favors a vascular malformation or hemangioma, with  metastatic disease considered unlikely. Consider follow CT or MR in 3-6 months, as clinically warranted 8 mm lesion in segment 7 is compatible with a benign hemangioma. Additional scattered hepatic cysts. 12 mm left adrenal nodule, unchanged, indeterminate. Metastasis is not excluded. **An incidental finding of potential clinical significance has been found. 8 mm nonaggressive pancreatic cyst along the pancreatic tail. Follow-up CT or MR abdomen with/without contrast is suggested in 1 year.** Electronically Signed   By: Julian Hy M.D.   On: 04/10/2016 16:23    Recent Lab Findings: Lab Results  Component Value Date   WBC 7.6 02/24/2016   HGB 12.0 (L) 02/24/2016   HCT 34.7 (L) 02/24/2016   PLT 266 02/24/2016   GLUCOSE 106 02/24/2016   ALT 19 02/24/2016   AST 22 02/24/2016   NA 135 (L) 02/24/2016   K 3.4 (L) 02/24/2016   CREATININE 0.5 (L) 02/24/2016   BUN 9.6 02/24/2016   CO2 26 02/24/2016   CT A/P with contrast 12/01/2015 Marked circumferential mid to distal esophageal wall thickening, no enlarged thoracic nodes or evidence of lung mets Small varices along the GE junction Periportal nodes measuring less than a centimeter. There is prominent upper abdominal node adjacent to the splenic vein/SMV confluence measuring 15x21m 13 mm left adrenal indeterminate nodule. Hypodense scattered liver lesions, the largest measuring 8 mm in the right, too small to characterize Enlarged heterogeneously enhancing prostate with ill-defined margins  12/15/2015 EGD/EUS 12/15/2015 Mass noted at 27CM from the incisors, by EUS criteria the lesion is T3 with breaks in the muscularis propria, total thickness 9 mm. Based on the presene of 1-2 metastatic regional lymph nodes, N staging was N1. The radial EUS would not pass through and did not dilate given clinical information already gleaned.  PATHOLOGY: Lower third of esophagus mass biopsy 11/23/2015 Mucinous adenocarcinoma, moderately differentiated.    PET:  IMPRESSION:  1.  Distal esophageal hypermetabolic mass consistent with known malignancy. 2.  Hypermetabolic activity in the left anterior prostate gland, correlate for malignancy. 3.  Metabolic density left adrenal nodule. 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 However, given history of malignancy, metastatic disease is not excluded. 4.  Left apical 2 mm pulmonary nodule, too small for evaluation by PET.   Result Narrative  INDICATION: C15.5: Malignant neoplasm of lower third of esophagus  TECHNIQUE: 7.1 millicuries of [id number] was administered intravenously. PET imaging was obtained from the skull base to the mid thighs. CT images were obtained for attenuation correction and localization purposes. Glucose level was 103 MG/DL. Time from  injection to scan was 62 minutes.  Comparison none.  FINDINGS:  Hypermetabolic distal esophageal mass max SUV 8.5 (image 118) consistent with known malignancy.  Hypermetabolic activity within the left anterior prostate Max SUV 19.8, no mass identified by CT.  Low density left adrenal nodule measuring 1.4 cm (image 146). 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  Remaining activity in the body is normal and physiologic. Additional  findings: Left apical 2 mm pulmonary nodule (image 66) 2 small for evaluation by PET. Gallbladder sludge. No wall thickening. Bilateral renal cysts. Nonobstructing mid left renal stone. Colonic diverticulosis   I have independently reviewed the above radiology studies  and reviewed the findings with the patient.  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 Patient has shownincrease in physical activity endurance and is now in a positive metabolic state gaining weight from his low of 112 up to 121 on his scale and 125 on the office scale. He's been cleared by cardiology  I discussed with the patient is wife and daughter proceeding October 2 with bronchoscopy transhiatal total esophagectomy and cervical esophagogastrostomy, feeding jejunostomy. The risks and options of surgery have been discussed with the patient and his family in detail on several occasions and he is willing to proceed. He has benefited from aggressive tube feedings in the. Treatment. And is now an anabolic state., Gaining weight and increasing his physical ability.  I had a detailed discussion with Keith Felten regarding the magnitude of the surgical esophagectomy procedure as well as the risks, the expected benefits, and alternatives.  I quoted [place] 5% perioperative mortality rate and a complication rate as high as 40%.  We specifically discussed complications, which include, but were not limited to: recurrent nerve injury with possible permanent hoarseness, anastomotic leak, airway and great vessel injury, conduit ischemia, thoracic duct leak, the inability to complete the operation via a transhiatal approach requiring a right thoracotomy,  bleeding, need for blood transfusion and the potential need for ventilator support.  Guy Seese has had questions answered is well informed and willing to proceed.    [name] MD      [address] [place] [address] Office [phone]   Beeper [phone]  04/13/2016 1:30 PM

## 2016-04-13 ENCOUNTER — Other Ambulatory Visit: Payer: Self-pay | Admitting: *Deleted

## 2016-04-13 DIAGNOSIS — C159 Malignant neoplasm of esophagus, unspecified: Secondary | ICD-10-CM

## 2016-04-19 ENCOUNTER — Encounter (HOSPITAL_COMMUNITY): Payer: Self-pay | Admitting: *Deleted

## 2016-04-19 ENCOUNTER — Ambulatory Visit: Payer: No Typology Code available for payment source | Admitting: Cardiovascular Disease

## 2016-04-19 NOTE — Pre-Procedure Instructions (Signed)
    Fred [name]  04/19/2016      [place], [address] [place] [address] Phone: [phone] Fax: [phone]  [id number] - [place], [address] [address] [place] [address] Phone: [phone] Fax: [phone]    Your procedure is scheduled on ==.04/23/16  Report to [facility] at 530 A.M.  Call this number if you have problems the morning of surgery:  [phone]   Remember:  Do not eat food or drink liquids after midnight.  Take these medicines the morning of surgery with A SIP OF WATER --atenolol,flonase,oxycodone   Do not wear jewelry, make-up or nail polish.  Do not wear lotions, powders, or perfumes, or deoderant.  Do not shave 48 hours prior to surgery.  Men may shave face and neck.  Do not bring valuables to the hospital.  [facility] is not responsible for any belongings or valuables.  Contacts, dentures or bridgework may not be worn into surgery.  Leave your suitcase in the car.  After surgery it may be brought to your room.  For patients admitted to the hospital, discharge time will be determined by your treatment team.  Patients discharged the day of surgery will not be allowed to drive home.   Name and phone number of your driver:   Special instructions: Do not take any aspirin,anti-inflammatories,vitamins,or herbal supplements 5-7 days prior to surgery.  Please read over the following fact sheets that you were given.

## 2016-04-20 ENCOUNTER — Encounter (HOSPITAL_COMMUNITY): Payer: Self-pay

## 2016-04-20 ENCOUNTER — Encounter (HOSPITAL_COMMUNITY)
Admission: RE | Admit: 2016-04-20 | Discharge: 2016-04-20 | Disposition: A | Discharge status: Home / Self Care | Payer: Non-veteran care | Source: Ambulatory Visit | Attending: Cardiothoracic Surgery | Admitting: Cardiothoracic Surgery

## 2016-04-20 DIAGNOSIS — Z01812 Encounter for preprocedural laboratory examination: Secondary | ICD-10-CM

## 2016-04-20 DIAGNOSIS — C159 Malignant neoplasm of esophagus, unspecified: Secondary | ICD-10-CM

## 2016-04-20 LAB — URINALYSIS, ROUTINE W REFLEX MICROSCOPIC
Glucose, UA: NEGATIVE mg/dL
Hgb urine dipstick: NEGATIVE
Ketones, ur: NEGATIVE mg/dL
Leukocytes, UA: NEGATIVE
Nitrite: NEGATIVE
Protein, ur: 30 mg/dL — AB
Specific Gravity, Urine: 1.023 (ref 1.005–1.030)
pH: 6 (ref 5.0–8.0)

## 2016-04-20 LAB — COMPREHENSIVE METABOLIC PANEL
ALT: 19 U/L (ref 17–63)
AST: 26 U/L (ref 15–41)
Albumin: 3.5 g/dL (ref 3.5–5.0)
Alkaline Phosphatase: 96 U/L (ref 38–126)
Anion gap: 8 (ref 5–15)
BUN: 9 mg/dL (ref 6–20)
CO2: 21 mmol/L — ABNORMAL LOW (ref 22–32)
Calcium: 9 mg/dL (ref 8.9–10.3)
Chloride: 109 mmol/L (ref 101–111)
Creatinine, Ser: 0.57 mg/dL — ABNORMAL LOW (ref 0.61–1.24)
GFR calc Af Amer: >60 mL/min (ref >60)
GFR calc non Af Amer: >60 mL/min (ref >60)
Glucose, Bld: 100 mg/dL — ABNORMAL HIGH (ref 65–99)
Potassium: 3.9 mmol/L (ref 3.5–5.1)
Sodium: 138 mmol/L (ref 135–145)
Total Bilirubin: 0.6 mg/dL (ref 0.3–1.2)
Total Protein: 6.3 g/dL — ABNORMAL LOW (ref 6.5–8.1)

## 2016-04-20 LAB — CBC
HCT: 39.9 % (ref 39.0–52.0)
Hemoglobin: 13.6 g/dL (ref 13.0–17.0)
MCH: 31.9 pg (ref 26.0–34.0)
MCHC: 34.1 g/dL (ref 30.0–36.0)
MCV: 93.7 fL (ref 78.0–100.0)
Platelets: 198 10*3/uL (ref 150–400)
RBC: 4.26 MIL/uL (ref 4.22–5.81)
RDW: 13.1 % (ref 11.5–15.5)
WBC: 5.7 10*3/uL (ref 4.0–10.5)

## 2016-04-20 LAB — PROTIME-INR
INR: 1.06
Prothrombin Time: 13.8 seconds (ref 11.4–15.2)

## 2016-04-20 LAB — URINE MICROSCOPIC-ADD ON: RBC / HPF: NONE SEEN RBC/hpf (ref 0–5)

## 2016-04-20 LAB — BLOOD GAS, ARTERIAL
Acid-base deficit: 0.6 mmol/L (ref 0.0–2.0)
Bicarbonate: 22.5 mmol/L (ref 20.0–28.0)
Drawn by: 449841
FIO2: 21
O2 Saturation: 98.6 %
Patient temperature: 98.6
pCO2 arterial: 30 mmHg — ABNORMAL LOW (ref 32.0–48.0)
pH, Arterial: 7.486 — ABNORMAL HIGH (ref 7.350–7.450)
pO2, Arterial: 120 mmHg — ABNORMAL HIGH (ref 83.0–108.0)

## 2016-04-20 LAB — APTT: aPTT: 38 seconds — ABNORMAL HIGH (ref 24–36)

## 2016-04-20 LAB — TYPE AND SCREEN
ABO/RH(D): O POS
Antibody Screen: NEGATIVE

## 2016-04-20 LAB — ABO/RH: ABO/RH(D): O POS

## 2016-04-23 ENCOUNTER — Inpatient Hospital Stay (HOSPITAL_COMMUNITY)
Admission: RE | Admit: 2016-04-23 | Discharge: 2016-05-04 | DRG: 327 | Disposition: A | Discharge status: Home / Self Care | Payer: Non-veteran care | Source: Ambulatory Visit | Attending: Cardiothoracic Surgery | Admitting: Cardiothoracic Surgery

## 2016-04-23 ENCOUNTER — Inpatient Hospital Stay (HOSPITAL_COMMUNITY): Payer: Non-veteran care | Admitting: Anesthesiology

## 2016-04-23 ENCOUNTER — Encounter (HOSPITAL_COMMUNITY)
Admission: RE | Disposition: A | Discharge status: Home / Self Care | Payer: Self-pay | Source: Ambulatory Visit | Attending: Cardiothoracic Surgery

## 2016-04-23 ENCOUNTER — Encounter (HOSPITAL_COMMUNITY): Payer: Self-pay | Admitting: *Deleted

## 2016-04-23 ENCOUNTER — Inpatient Hospital Stay (HOSPITAL_COMMUNITY): Payer: Non-veteran care

## 2016-04-23 DIAGNOSIS — Z9689 Presence of other specified functional implants: Secondary | ICD-10-CM

## 2016-04-23 DIAGNOSIS — E440 Moderate protein-calorie malnutrition: Secondary | ICD-10-CM | POA: Diagnosis present

## 2016-04-23 DIAGNOSIS — Z800 Family history of malignant neoplasm of digestive organs: Secondary | ICD-10-CM

## 2016-04-23 DIAGNOSIS — Z9221 Personal history of antineoplastic chemotherapy: Secondary | ICD-10-CM

## 2016-04-23 DIAGNOSIS — Z820 Family history of epilepsy and other diseases of the nervous system: Secondary | ICD-10-CM | POA: Diagnosis not present

## 2016-04-23 DIAGNOSIS — Z6820 Body mass index (BMI) 20.0-20.9, adult: Secondary | ICD-10-CM

## 2016-04-23 DIAGNOSIS — Z0189 Encounter for other specified special examinations: Secondary | ICD-10-CM

## 2016-04-23 DIAGNOSIS — Z87891 Personal history of nicotine dependence: Secondary | ICD-10-CM

## 2016-04-23 DIAGNOSIS — Z823 Family history of stroke: Secondary | ICD-10-CM | POA: Diagnosis not present

## 2016-04-23 DIAGNOSIS — K219 Gastro-esophageal reflux disease without esophagitis: Secondary | ICD-10-CM | POA: Diagnosis present

## 2016-04-23 DIAGNOSIS — E119 Type 2 diabetes mellitus without complications: Secondary | ICD-10-CM | POA: Diagnosis present

## 2016-04-23 DIAGNOSIS — Z09 Encounter for follow-up examination after completed treatment for conditions other than malignant neoplasm: Secondary | ICD-10-CM

## 2016-04-23 DIAGNOSIS — I10 Essential (primary) hypertension: Secondary | ICD-10-CM | POA: Diagnosis present

## 2016-04-23 DIAGNOSIS — Z923 Personal history of irradiation: Secondary | ICD-10-CM | POA: Diagnosis not present

## 2016-04-23 DIAGNOSIS — C159 Malignant neoplasm of esophagus, unspecified: Secondary | ICD-10-CM

## 2016-04-23 DIAGNOSIS — E46 Unspecified protein-calorie malnutrition: Secondary | ICD-10-CM

## 2016-04-23 DIAGNOSIS — D62 Acute posthemorrhagic anemia: Secondary | ICD-10-CM | POA: Diagnosis not present

## 2016-04-23 DIAGNOSIS — Z833 Family history of diabetes mellitus: Secondary | ICD-10-CM | POA: Diagnosis not present

## 2016-04-23 DIAGNOSIS — E8770 Fluid overload, unspecified: Secondary | ICD-10-CM | POA: Diagnosis not present

## 2016-04-23 DIAGNOSIS — J948 Other specified pleural conditions: Secondary | ICD-10-CM | POA: Diagnosis not present

## 2016-04-23 DIAGNOSIS — C155 Malignant neoplasm of lower third of esophagus: Secondary | ICD-10-CM | POA: Diagnosis present

## 2016-04-23 DIAGNOSIS — R000 Tachycardia, unspecified: Secondary | ICD-10-CM | POA: Diagnosis present

## 2016-04-23 HISTORY — PX: JEJUNOSTOMY: SHX313

## 2016-04-23 HISTORY — PX: CHEST TUBE INSERTION: SHX231

## 2016-04-23 HISTORY — PX: VIDEO BRONCHOSCOPY: SHX5072

## 2016-04-23 HISTORY — PX: COMPLETE ESOPHAGECTOMY: SHX5286

## 2016-04-23 LAB — POCT I-STAT 7, (LYTES, BLD GAS, ICA,H+H)
Acid-Base Excess: 1 mmol/L (ref 0.0–2.0)
Bicarbonate: 25.7 mmol/L (ref 20.0–28.0)
Bicarbonate: 25.2 mmol/L (ref 20.0–28.0)
Calcium, Ion: 1.1 mmol/L — ABNORMAL LOW (ref 1.15–1.40)
Calcium, Ion: 1.12 mmol/L — ABNORMAL LOW (ref 1.15–1.40)
HCT: 32 % — ABNORMAL LOW (ref 39.0–52.0)
HCT: 32 % — ABNORMAL LOW (ref 39.0–52.0)
Hemoglobin: 10.9 g/dL — ABNORMAL LOW (ref 13.0–17.0)
Hemoglobin: 10.9 g/dL — ABNORMAL LOW (ref 13.0–17.0)
O2 Saturation: 100 %
O2 Saturation: 100 %
Patient temperature: 35.2
Patient temperature: 35.5
Potassium: 3.9 mmol/L (ref 3.5–5.1)
Potassium: 3.4 mmol/L — ABNORMAL LOW (ref 3.5–5.1)
Sodium: 137 mmol/L (ref 135–145)
Sodium: 138 mmol/L (ref 135–145)
TCO2: 27 mmol/L (ref 0–100)
TCO2: 26 mmol/L (ref 0–100)
pCO2 arterial: 39 mmHg (ref 32.0–48.0)
pCO2 arterial: 40.7 mmHg (ref 32.0–48.0)
pH, Arterial: 7.418 (ref 7.350–7.450)
pH, Arterial: 7.393 (ref 7.350–7.450)
pO2, Arterial: 354 mmHg — ABNORMAL HIGH (ref 83.0–108.0)
pO2, Arterial: 222 mmHg — ABNORMAL HIGH (ref 83.0–108.0)

## 2016-04-23 LAB — CBC
HCT: 34 % — ABNORMAL LOW (ref 39.0–52.0)
Hemoglobin: 11.4 g/dL — ABNORMAL LOW (ref 13.0–17.0)
MCH: 31.3 pg (ref 26.0–34.0)
MCHC: 33.5 g/dL (ref 30.0–36.0)
MCV: 93.4 fL (ref 78.0–100.0)
PLATELETS: 168 10*3/uL (ref 150–400)
RBC: 3.64 MIL/uL — AB (ref 4.22–5.81)
RDW: 12.8 % (ref 11.5–15.5)
WBC: 10.2 10*3/uL (ref 4.0–10.5)

## 2016-04-23 LAB — POCT I-STAT 3, ART BLOOD GAS (G3+)
Acid-base deficit: 2 mmol/L (ref 0.0–2.0)
Bicarbonate: 23.4 mmol/L (ref 20.0–28.0)
O2 Saturation: 99 %
Patient temperature: 98.2
TCO2: 25 mmol/L (ref 0–100)
pCO2 arterial: 39.5 mmHg (ref 32.0–48.0)
pH, Arterial: 7.378 (ref 7.350–7.450)
pO2, Arterial: 149 mmHg — ABNORMAL HIGH (ref 83.0–108.0)

## 2016-04-23 LAB — BASIC METABOLIC PANEL
Anion gap: 10 (ref 5–15)
CALCIUM: 8.4 mg/dL — AB (ref 8.9–10.3)
CO2: 23 mmol/L (ref 22–32)
CREATININE: 0.67 mg/dL (ref 0.61–1.24)
Chloride: 103 mmol/L (ref 101–111)
GFR calc Af Amer: >60 mL/min (ref >60)
GLUCOSE: 195 mg/dL — AB (ref 65–99)
POTASSIUM: 3.5 mmol/L (ref 3.5–5.1)
SODIUM: 136 mmol/L (ref 135–145)

## 2016-04-23 SURGERY — BRONCHOSCOPY, VIDEO-ASSISTED
Anesthesia: General | Site: Chest

## 2016-04-23 MED ORDER — DEXMEDETOMIDINE HCL IN NACL 200 MCG/50ML IV SOLN
0.0000 ug/kg/h | INTRAVENOUS | Status: DC
  Filled 2016-04-23: qty 50

## 2016-04-23 MED ORDER — EPINEPHRINE HCL 1 MG/ML IJ SOLN
INTRAMUSCULAR | Status: AC
  Filled 2016-04-23: qty 1

## 2016-04-23 MED ORDER — PHENYLEPHRINE HCL 10 MG/ML IJ SOLN
INTRAMUSCULAR | Status: DC | PRN
  Administered 2016-04-23: 40 ug via INTRAVENOUS
  Administered 2016-04-23: 80 ug via INTRAVENOUS
  Administered 2016-04-23 (×3): 40 ug via INTRAVENOUS

## 2016-04-23 MED ORDER — LACTATED RINGERS IV SOLN
INTRAVENOUS | Status: DC | PRN
  Administered 2016-04-23 (×5): via INTRAVENOUS

## 2016-04-23 MED ORDER — MIDAZOLAM HCL 2 MG/2ML IJ SOLN
INTRAMUSCULAR | Status: AC
  Filled 2016-04-23: qty 2

## 2016-04-23 MED ORDER — HEMOSTATIC AGENTS (NO CHARGE) OPTIME
TOPICAL | Status: DC | PRN
  Administered 2016-04-23: 1 via TOPICAL

## 2016-04-23 MED ORDER — ESMOLOL HCL 100 MG/10ML IV SOLN
INTRAVENOUS | Status: DC | PRN
  Administered 2016-04-23: 40 mg via INTRAVENOUS
  Administered 2016-04-23: 60 mg via INTRAVENOUS

## 2016-04-23 MED ORDER — DEXTROSE 5 % IV SOLN
1.0000 g | INTRAVENOUS | Status: AC
  Administered 2016-04-23 (×2): 1 g via INTRAVENOUS
  Filled 2016-04-23: qty 1

## 2016-04-23 MED ORDER — DEXAMETHASONE SODIUM PHOSPHATE 10 MG/ML IJ SOLN
INTRAMUSCULAR | Status: DC | PRN
  Administered 2016-04-23: 10 mg via INTRAVENOUS

## 2016-04-23 MED ORDER — CHLORHEXIDINE GLUCONATE 0.12 % MT SOLN
15.0000 mL | OROMUCOSAL | Status: AC
  Administered 2016-04-23: 15 mL via OROMUCOSAL

## 2016-04-23 MED ORDER — ALBUMIN HUMAN 5 % IV SOLN
INTRAVENOUS | Status: DC | PRN
  Administered 2016-04-23 (×3): via INTRAVENOUS

## 2016-04-23 MED ORDER — ORAL CARE MOUTH RINSE
15.0000 mL | Freq: Two times a day (BID) | OROMUCOSAL | Status: DC
  Administered 2016-04-23 – 2016-04-25 (×4): 15 mL via OROMUCOSAL

## 2016-04-23 MED ORDER — SUGAMMADEX SODIUM 200 MG/2ML IV SOLN: INTRAVENOUS | Status: DC | PRN

## 2016-04-23 MED ORDER — ORAL CARE MOUTH RINSE
15.0000 mL | Freq: Two times a day (BID) | OROMUCOSAL | Status: DC
  Administered 2016-04-24 – 2016-05-01 (×12): 15 mL via OROMUCOSAL

## 2016-04-23 MED ORDER — MIDAZOLAM HCL 2 MG/2ML IJ SOLN: 2.0000 mg | INTRAMUSCULAR | Status: DC | PRN

## 2016-04-23 MED ORDER — SODIUM CHLORIDE 0.9% FLUSH
10.0000 mL | Freq: Two times a day (BID) | INTRAVENOUS | Status: DC
  Administered 2016-04-23 – 2016-04-26 (×6): 10 mL via INTRAVENOUS

## 2016-04-23 MED ORDER — ONDANSETRON HCL 4 MG/2ML IJ SOLN
INTRAMUSCULAR | Status: AC
  Filled 2016-04-23: qty 2

## 2016-04-23 MED ORDER — ARTIFICIAL TEARS OP OINT
TOPICAL_OINTMENT | OPHTHALMIC | Status: AC
  Filled 2016-04-23: qty 7

## 2016-04-23 MED ORDER — SUGAMMADEX SODIUM 200 MG/2ML IV SOLN
INTRAVENOUS | Status: AC
  Filled 2016-04-23: qty 2

## 2016-04-23 MED ORDER — FENTANYL CITRATE (PF) 100 MCG/2ML IJ SOLN
INTRAMUSCULAR | Status: AC
  Filled 2016-04-23: qty 4

## 2016-04-23 MED ORDER — FENTANYL CITRATE (PF) 100 MCG/2ML IJ SOLN
INTRAMUSCULAR | Status: AC
Start: 2016-04-23 — End: 2016-04-23
  Filled 2016-04-23: qty 4

## 2016-04-23 MED ORDER — PROPOFOL 10 MG/ML IV BOLUS
INTRAVENOUS | Status: DC | PRN
  Administered 2016-04-23: 120 mg via INTRAVENOUS

## 2016-04-23 MED ORDER — LIDOCAINE 2% (20 MG/ML) 5 ML SYRINGE
INTRAMUSCULAR | Status: AC
  Filled 2016-04-23: qty 10

## 2016-04-23 MED ORDER — MORPHINE SULFATE (PF) 4 MG/ML IV SOLN
INTRAVENOUS | Status: AC
  Administered 2016-04-23: 4 mg
  Filled 2016-04-23: qty 1

## 2016-04-23 MED ORDER — METOCLOPRAMIDE HCL 5 MG/ML IJ SOLN
10.0000 mg | Freq: Four times a day (QID) | INTRAMUSCULAR | Status: DC
  Administered 2016-04-23 – 2016-04-27 (×17): 10 mg via INTRAVENOUS
  Filled 2016-04-23 (×15): qty 2

## 2016-04-23 MED ORDER — ONDANSETRON HCL 4 MG/2ML IJ SOLN
INTRAMUSCULAR | Status: DC | PRN
  Administered 2016-04-23: 4 mg via INTRAVENOUS

## 2016-04-23 MED ORDER — ALBUTEROL SULFATE (2.5 MG/3ML) 0.083% IN NEBU: 2.5000 mg | INHALATION_SOLUTION | RESPIRATORY_TRACT | Status: AC | PRN

## 2016-04-23 MED ORDER — POTASSIUM CHLORIDE 10 MEQ/50ML IV SOLN
10.0000 meq | INTRAVENOUS | Status: DC | PRN
  Filled 2016-04-23 (×2): qty 50

## 2016-04-23 MED ORDER — DEXTROSE 5 % IV SOLN
1.0000 g | Freq: Once | INTRAVENOUS | Status: DC
  Filled 2016-04-23: qty 1

## 2016-04-23 MED ORDER — DEXTROSE 5 % IV SOLN
2.0000 g | Freq: Four times a day (QID) | INTRAVENOUS | Status: AC
  Administered 2016-04-23 – 2016-04-24 (×4): 2 g via INTRAVENOUS
  Filled 2016-04-23 (×6): qty 2

## 2016-04-23 MED ORDER — ONDANSETRON HCL 4 MG/2ML IJ SOLN: 4.0000 mg | INTRAMUSCULAR | Status: DC | PRN

## 2016-04-23 MED ORDER — KCL IN DEXTROSE-NACL 20-5-0.45 MEQ/L-%-% IV SOLN
INTRAVENOUS | Status: DC
  Administered 2016-04-23 – 2016-04-26 (×4): via INTRAVENOUS
  Filled 2016-04-23 (×9): qty 1000

## 2016-04-23 MED ORDER — PHENYLEPHRINE HCL 10 MG/ML IJ SOLN
INTRAVENOUS | Status: DC | PRN
  Administered 2016-04-23: 20 ug/min via INTRAVENOUS

## 2016-04-23 MED ORDER — LIDOCAINE HCL (CARDIAC) 20 MG/ML IV SOLN
INTRAVENOUS | Status: DC | PRN
  Administered 2016-04-23: 50 mg via INTRATRACHEAL

## 2016-04-23 MED ORDER — 0.9 % SODIUM CHLORIDE (POUR BTL) OPTIME
TOPICAL | Status: DC | PRN
  Administered 2016-04-23: 3000 mL

## 2016-04-23 MED ORDER — ROCURONIUM BROMIDE 10 MG/ML (PF) SYRINGE
PREFILLED_SYRINGE | INTRAVENOUS | Status: AC
  Filled 2016-04-23: qty 10

## 2016-04-23 MED ORDER — MORPHINE SULFATE (PF) 2 MG/ML IV SOLN
2.0000 mg | INTRAVENOUS | Status: DC | PRN
  Administered 2016-04-23: 2 mg via INTRAVENOUS
  Administered 2016-04-23: 4 mg via INTRAVENOUS
  Administered 2016-04-23: 2 mg via INTRAVENOUS
  Administered 2016-04-24: 4 mg via INTRAVENOUS
  Administered 2016-04-24 (×2): 2 mg via INTRAVENOUS
  Administered 2016-04-24: 4 mg via INTRAVENOUS
  Administered 2016-04-24 (×2): 2 mg via INTRAVENOUS
  Administered 2016-04-24: 4 mg via INTRAVENOUS
  Administered 2016-04-25 (×5): 2 mg via INTRAVENOUS
  Filled 2016-04-23: qty 2
  Filled 2016-04-23 (×3): qty 1
  Filled 2016-04-23 (×2): qty 2
  Filled 2016-04-23: qty 1
  Filled 2016-04-23: qty 2
  Filled 2016-04-23: qty 1
  Filled 2016-04-23: qty 2
  Filled 2016-04-23: qty 1
  Filled 2016-04-23: qty 2
  Filled 2016-04-23: qty 1
  Filled 2016-04-23: qty 2
  Filled 2016-04-23: qty 1

## 2016-04-23 MED ORDER — HYDRALAZINE HCL 20 MG/ML IJ SOLN
10.0000 mg | Freq: Four times a day (QID) | INTRAMUSCULAR | Status: DC | PRN
  Administered 2016-04-23: 10 mg via INTRAVENOUS
  Filled 2016-04-23: qty 0.5
  Filled 2016-04-23: qty 1

## 2016-04-23 MED ORDER — ESMOLOL HCL 100 MG/10ML IV SOLN
INTRAVENOUS | Status: AC
  Filled 2016-04-23: qty 10

## 2016-04-23 MED ORDER — POTASSIUM CHLORIDE 10 MEQ/50ML IV SOLN
10.0000 meq | INTRAVENOUS | Status: AC | PRN
  Administered 2016-04-23 (×3): 10 meq via INTRAVENOUS
  Filled 2016-04-23: qty 50

## 2016-04-23 MED ORDER — METOPROLOL TARTRATE 5 MG/5ML IV SOLN
2.5000 mg | Freq: Four times a day (QID) | INTRAVENOUS | Status: DC | PRN
  Administered 2016-04-23 – 2016-04-26 (×4): 5 mg via INTRAVENOUS
  Filled 2016-04-23 (×4): qty 5

## 2016-04-23 MED ORDER — ROCURONIUM BROMIDE 10 MG/ML (PF) SYRINGE
PREFILLED_SYRINGE | INTRAVENOUS | Status: AC
  Filled 2016-04-23: qty 20

## 2016-04-23 MED ORDER — FENTANYL CITRATE (PF) 100 MCG/2ML IJ SOLN
INTRAMUSCULAR | Status: DC | PRN
  Administered 2016-04-23: 100 ug via INTRAVENOUS
  Administered 2016-04-23: 50 ug via INTRAVENOUS
  Administered 2016-04-23: 100 ug via INTRAVENOUS
  Administered 2016-04-23 (×3): 50 ug via INTRAVENOUS
  Administered 2016-04-23: 100 ug via INTRAVENOUS
  Administered 2016-04-23 (×2): 50 ug via INTRAVENOUS
  Administered 2016-04-23: 100 ug via INTRAVENOUS

## 2016-04-23 MED ORDER — FAMOTIDINE IN NACL 20-0.9 MG/50ML-% IV SOLN
20.0000 mg | Freq: Two times a day (BID) | INTRAVENOUS | Status: DC
  Administered 2016-04-23 – 2016-05-02 (×20): 20 mg via INTRAVENOUS
  Filled 2016-04-23 (×24): qty 50

## 2016-04-23 MED ORDER — MIDAZOLAM HCL 5 MG/5ML IJ SOLN
INTRAMUSCULAR | Status: DC | PRN
  Administered 2016-04-23 (×2): 1 mg via INTRAVENOUS

## 2016-04-23 MED ORDER — DEXTROSE 5 % IV SOLN
1.5000 g | INTRAVENOUS | Status: DC
  Filled 2016-04-23: qty 1.5

## 2016-04-23 MED ORDER — DEXAMETHASONE SODIUM PHOSPHATE 10 MG/ML IJ SOLN
INTRAMUSCULAR | Status: AC
  Filled 2016-04-23: qty 1

## 2016-04-23 MED ORDER — MORPHINE SULFATE (PF) 2 MG/ML IV SOLN
1.0000 mg | INTRAVENOUS | Status: DC | PRN
  Administered 2016-04-23 (×2): 2 mg via INTRAVENOUS

## 2016-04-23 MED ORDER — ROCURONIUM BROMIDE 100 MG/10ML IV SOLN
INTRAVENOUS | Status: DC | PRN
  Administered 2016-04-23: 40 mg via INTRAVENOUS
  Administered 2016-04-23: 20 mg via INTRAVENOUS
  Administered 2016-04-23 (×2): 10 mg via INTRAVENOUS
  Administered 2016-04-23 (×2): 20 mg via INTRAVENOUS
  Administered 2016-04-23: 30 mg via INTRAVENOUS

## 2016-04-23 MED ORDER — FENTANYL CITRATE (PF) 100 MCG/2ML IJ SOLN
INTRAMUSCULAR | Status: AC
  Filled 2016-04-23: qty 6

## 2016-04-23 MED ORDER — CHLORHEXIDINE GLUCONATE 0.12 % MT SOLN
15.0000 mL | Freq: Two times a day (BID) | OROMUCOSAL | Status: DC
  Administered 2016-04-24 – 2016-05-04 (×15): 15 mL via OROMUCOSAL
  Filled 2016-04-23 (×14): qty 15

## 2016-04-23 MED ORDER — SUGAMMADEX SODIUM 200 MG/2ML IV SOLN
INTRAVENOUS | Status: DC | PRN
  Administered 2016-04-23: 100 mg via INTRAVENOUS
  Administered 2016-04-23: 150 mg via INTRAVENOUS

## 2016-04-23 SURGICAL SUPPLY — 118 items
CANISTER SUCTION 2500CC (MISCELLANEOUS) ×8 IMPLANT
CATH FOLEY 2WAY SLVR 18FR 30CC (CATHETERS) ×4 IMPLANT
CATH FOLEY 2WAY SLVR 30CC 20FR (CATHETERS) ×4 IMPLANT
CATH ROBINSON RED A/P 18FR (CATHETERS) ×4 IMPLANT
CELLS DAT CNTRL 66122 CELL SVR (MISCELLANEOUS) ×3 IMPLANT
CLIP FOGARTY SPRING 6M (CLIP) ×4 IMPLANT
CLIP TI LARGE 6 (CLIP) ×8 IMPLANT
CLIP TI MEDIUM 24 (CLIP) ×4 IMPLANT
CLIP TI WIDE RED SMALL 24 (CLIP) ×4 IMPLANT
CONT SPEC 4OZ CLIKSEAL STRL BL (MISCELLANEOUS) ×4 IMPLANT
COUNTER NEEDLE 20 DBL MAG RED (NEEDLE) ×4 IMPLANT
COVER PROBE W GEL 5X96 (DRAPES) ×4 IMPLANT
COVER SURGICAL LIGHT HANDLE (MISCELLANEOUS) ×4 IMPLANT
DERMABOND ADVANCED (GAUZE/BANDAGES/DRESSINGS) ×2
DERMABOND ADVANCED .7 DNX12 (GAUZE/BANDAGES/DRESSINGS) ×6 IMPLANT
DRAIN CHANNEL 28F RND 3/8 FF (WOUND CARE) ×4 IMPLANT
DRAIN PENROSE 1/2X36 STERILE (WOUND CARE) ×4 IMPLANT
DRAIN SUMP SARATOGA 24F (WOUND CARE) ×4 IMPLANT
DRAPE CAMERA CLOSED 9X96 (DRAPES) ×4 IMPLANT
DRAPE CAMERA VIDEO/LASER (DRAPES) ×4 IMPLANT
DRAPE CV SPLIT W-CLR ANES SCRN (DRAPES) ×4 IMPLANT
DRAPE INCISE IOBAN 66X45 STRL (DRAPES) ×12 IMPLANT
DRAPE PERI GROIN 82X75IN TIB (DRAPES) ×4 IMPLANT
DRAPE WARM FLUID 44X44 (DRAPE) ×4 IMPLANT
DRSG AQUACEL AG ADV 3.5X14 (GAUZE/BANDAGES/DRESSINGS) ×4 IMPLANT
ELECT BLADE 4.0 EZ CLEAN MEGAD (MISCELLANEOUS) ×4
ELECT BLADE 6.5 EXT (BLADE) ×4 IMPLANT
ELECT NEEDLE BLADE 2-5/6 (NEEDLE) ×4 IMPLANT
ELECT NEEDLE TIP 2.8 STRL (NEEDLE) ×4 IMPLANT
ELECT REM PT RETURN 9FT ADLT (ELECTROSURGICAL) ×8
ELECTRODE BLDE 4.0 EZ CLN MEGD (MISCELLANEOUS) ×3 IMPLANT
ELECTRODE REM PT RTRN 9FT ADLT (ELECTROSURGICAL) ×6 IMPLANT
GLOVE BIO SURGEON STRL SZ 6.5 (GLOVE) ×20 IMPLANT
GLOVE BIOGEL PI IND STRL 6.5 (GLOVE) ×9 IMPLANT
GLOVE BIOGEL PI INDICATOR 6.5 (GLOVE) ×3
GLOVE ECLIPSE 6.5 STRL STRAW (GLOVE) ×12 IMPLANT
GLOVE SURG SS PI 7.0 STRL IVOR (GLOVE) ×4 IMPLANT
GOWN STRL REUS W/ TWL LRG LVL3 (GOWN DISPOSABLE) ×18 IMPLANT
GOWN STRL REUS W/ TWL XL LVL3 (GOWN DISPOSABLE) ×3 IMPLANT
GOWN STRL REUS W/TWL LRG LVL3 (GOWN DISPOSABLE) ×6
GOWN STRL REUS W/TWL XL LVL3 (GOWN DISPOSABLE) ×1
HANDLE STAPLE ENDO GIA SHORT (STAPLE) ×1
HEMOSTAT SURGICEL 2X14 (HEMOSTASIS) IMPLANT
INSERT FOGARTY 61MM (MISCELLANEOUS) ×4 IMPLANT
KIT BASIN OR (CUSTOM PROCEDURE TRAY) ×4 IMPLANT
KIT CLEAN ENDO COMPLIANCE (KITS) ×4 IMPLANT
KIT SUCTION CATH 14FR (SUCTIONS) ×4 IMPLANT
LIGASURE IMPACT 36 18CM CVD LR (INSTRUMENTS) ×4 IMPLANT
LOOP VESSEL MINI RED (MISCELLANEOUS) ×4 IMPLANT
NS IRRIG 1000ML POUR BTL (IV SOLUTION) ×12 IMPLANT
OIL SILICONE PENTAX (PARTS (SERVICE/REPAIRS)) ×4 IMPLANT
PACK CHEST (CUSTOM PROCEDURE TRAY) ×4 IMPLANT
PAD ARMBOARD 7.5X6 YLW CONV (MISCELLANEOUS) ×8 IMPLANT
PAD SHARPS MAGNETIC DISPOSAL (MISCELLANEOUS) ×4 IMPLANT
PLUG CATH AND CAP STER (CATHETERS) IMPLANT
PROBE NERVBE PRASS .33 (MISCELLANEOUS) IMPLANT
PROBE PENCIL 8 MHZ STRL DISP (MISCELLANEOUS) ×4 IMPLANT
RELOAD EGIA 45 MED/THCK PURPLE (STAPLE) ×4 IMPLANT
RELOAD EGIA TRIS TAN 45 CVD (STAPLE) IMPLANT
RELOAD ENDO GIA 30 3.5 (STAPLE) IMPLANT
RELOAD LINEAR CUT PROX 55 BLUE (ENDOMECHANICALS) ×16 IMPLANT
RETAINER VISCERA MED (MISCELLANEOUS) ×4 IMPLANT
RETRACTOR WND ALEXIS 25 LRG (MISCELLANEOUS) ×3 IMPLANT
RTRCTR WOUND ALEXIS 18CM MED (MISCELLANEOUS) ×4
RTRCTR WOUND ALEXIS 25CM LRG (MISCELLANEOUS) ×4
SHEARS HARMONIC ACE PLUS 36CM (ENDOMECHANICALS) ×4 IMPLANT
SPONGE GAUZE 4X4 12PLY STER LF (GAUZE/BANDAGES/DRESSINGS) ×8 IMPLANT
SPONGE LAP 18X18 X RAY DECT (DISPOSABLE) ×20 IMPLANT
STAPLE RELOAD 2.5MM WHITE (STAPLE) ×8 IMPLANT
STAPLER ENDO GIA 12MM SHORT (STAPLE) ×3 IMPLANT
STAPLER PROXIMATE 55 BLUE (STAPLE) ×4 IMPLANT
STAPLER VASCULAR ECHELON 35 (CUTTER) ×4 IMPLANT
STAPLER VISISTAT 35W (STAPLE) IMPLANT
SUCTION POOLE TIP (SUCTIONS) ×4 IMPLANT
SUT ETHILON 3 0 FSL (SUTURE) ×8 IMPLANT
SUT PDS AB 4-0 SH 27 (SUTURE) IMPLANT
SUT PROLENE 0 CT 1 CR/8 (SUTURE) IMPLANT
SUT PROLENE 1 XLH (SUTURE) ×4 IMPLANT
SUT PROLENE 1 XLH 60 (SUTURE) ×8 IMPLANT
SUT PROLENE 2 0 MH 48 (SUTURE) IMPLANT
SUT PROLENE 2 TP 1 (SUTURE) ×16 IMPLANT
SUT PROLENE 3 0 RB 1 (SUTURE) IMPLANT
SUT PROLENE 3 0 SH 1 (SUTURE) ×12 IMPLANT
SUT PROLENE 4 0 RB 1 (SUTURE)
SUT PROLENE 4-0 RB1 .5 CRCL 36 (SUTURE) IMPLANT
SUT SILK  1 MH (SUTURE) ×1
SUT SILK 1 MH (SUTURE) ×3 IMPLANT
SUT SILK 1 TIES 10X30 (SUTURE) ×4 IMPLANT
SUT SILK 2 0 SH CR/8 (SUTURE) ×4 IMPLANT
SUT SILK 2 0SH CR/8 30 (SUTURE) ×8 IMPLANT
SUT SILK 3 0 SH CR/8 (SUTURE) ×8 IMPLANT
SUT SILK 3 0SH CR/8 30 (SUTURE) IMPLANT
SUT SILK 4 0 SH CR/8 (SUTURE) ×4 IMPLANT
SUT VIC AB 1 CTX 27 (SUTURE) ×8 IMPLANT
SUT VIC AB 2-0 CT1 18 (SUTURE) IMPLANT
SUT VIC AB 2-0 CTX 27 (SUTURE) ×8 IMPLANT
SUT VIC AB 2-0 CTX 36 (SUTURE) ×4 IMPLANT
SUT VIC AB 3-0 MH 27 (SUTURE) IMPLANT
SUT VIC AB 3-0 SH 18 (SUTURE) ×8 IMPLANT
SUT VIC AB 3-0 X1 27 (SUTURE) ×8 IMPLANT
SUT VIC AB 4-0 SH 18 (SUTURE) ×12 IMPLANT
SUT VICRYL 2 TP 1 (SUTURE) IMPLANT
SYR 20CC LL (SYRINGE) IMPLANT
SYR 20ML ECCENTRIC (SYRINGE) ×4 IMPLANT
SYR 30ML SLIP (SYRINGE) ×8 IMPLANT
SYR 5ML LUER SLIP (SYRINGE) ×4 IMPLANT
SYR TOOMEY 50ML (SYRINGE) ×4 IMPLANT
SYRINGE 10CC LL (SYRINGE) IMPLANT
SYSTEM SAHARA CHEST DRAIN RE-I (WOUND CARE) ×4 IMPLANT
TAPE CLOTH SURG 4X10 WHT LF (GAUZE/BANDAGES/DRESSINGS) ×8 IMPLANT
TAPE UMBILICAL 1/8 X36 TWILL (MISCELLANEOUS) ×8 IMPLANT
TOWEL OR 17X26 10 PK STRL BLUE (TOWEL DISPOSABLE) ×8 IMPLANT
TRAY FOLEY CATH 16FRSI W/METER (SET/KITS/TRAYS/PACK) ×4 IMPLANT
TUBE CONNECTING 12X1/4 (SUCTIONS) ×4 IMPLANT
TUBE CONNECTING 20X1/4 (TUBING) ×12 IMPLANT
TUBE ENDOTRAC EMG 8X11.3 (MISCELLANEOUS) ×4 IMPLANT
WATER STERILE IRR 1000ML POUR (IV SOLUTION) ×4 IMPLANT
YANKAUER SUCT BULB TIP NO VENT (SUCTIONS) ×8 IMPLANT

## 2016-04-23 NOTE — Progress Notes (Signed)
ETT removed by Francee Piccolo, CRNA at 1556.

## 2016-04-23 NOTE — OR Nursing (Signed)
Family update per Dr. Servando Snare @[phone] .

## 2016-04-23 NOTE — Brief Op Note (Signed)
04/23/2016  2:50 PM  PATIENT:  Fred [name]  75 y.o. male  PRE-OPERATIVE DIAGNOSIS:  ESOPHAGEAL CANCER  POST-OPERATIVE DIAGNOSIS:  ESOPHAGEAL CANCER  PROCEDURE:  Procedure(s):  VIDEO BRONCHOSCOPY (N/A)  TRANSHIATIAL TOTAL ESOPHAGECTOMY COMPLETE (N/A)  FEEDING JEJUNOSTOMY (N/A)   SURGEON:  Surgeon(s) and Role:    * [name], MD - Primary  PHYSICIAN ASSISTANT: Anna-Marie Coller PA-C,  ASSISTANTS:   Alcide Evener RNFA  ANESTHESIA:   general  EBL:  Total I/O In: 4000 [I.V.:3000; IV Piggyback:1000] Out: 2800 [Urine:2400; Blood:400]  BLOOD ADMINISTERED:none  DRAINS: 28 Straight Chest Tube Left Chest, Jejunostomy tube   LOCAL MEDICATIONS USED:  NONE  SPECIMEN:  Source of Specimen:  Esophagus, Portion of Stomach  DISPOSITION OF SPECIMEN:  PATHOLOGY  COUNTS:  YES  TOURNIQUET:  * No tourniquets in log *  DICTATION: .Dragon Dictation  PLAN OF CARE: Admit to inpatient   PATIENT DISPOSITION:  ICU - intubated and hemodynamically stable.   Delay start of Pharmacological VTE agent (>24hrs) due to surgical blood loss or risk of bleeding: yes

## 2016-04-23 NOTE — Progress Notes (Signed)
Patient ID: Fred [name], male   DOB: 04/30/1941, 75 y.o.   MRN: [id number] EVENING ROUNDS NOTE :     [address]       [place] [address]             [phone]                 Day of Surgery Procedure(s) (LRB): VIDEO BRONCHOSCOPY (N/A) TRANSHIATIAL TOTAL ESOPHAGECTOMY COMPLETE; CERVICAL ESOPHAGOGASTROSTOMY AND PYLOROPLASTY (N/A) FEEDING JEJUNOSTOMY (N/A) CHEST TUBE INSERTION (Left)  Total Length of Stay:  LOS: 0 days  BP 127/67   Pulse (!) 124   Temp 97.6 F (36.4 C) (Oral)   Resp (!) 21   Ht 5\' 5"  (1.651 m)   Wt 126 lb (57.2 kg)   SpO2 100%   BMI 20.97 kg/m   .Intake/Output      10/02 0701 - 10/03 0700   I.V. (mL/kg) 4500 (78.7)   Other 30   IV Piggyback 1350   Total Intake(mL/kg) 5880 (102.8)   Urine (mL/kg/hr) 3350 (4.5)   Blood 500 (0.7)   Chest Tube 100 (0.1)   Total Output 3950   Net +1930         . dexmedetomidine    . dextrose 5 % and 0.45 % NaCl with KCl 20 mEq/L 100 mL/hr at 04/23/16 1722     Lab Results  Component Value Date   WBC 10.2 04/23/2016   HGB 11.4 (L) 04/23/2016   HCT 34.0 (L) 04/23/2016   PLT 168 04/23/2016   GLUCOSE 195 (H) 04/23/2016   ALT 19 04/20/2016   AST 26 04/20/2016   NA 136 04/23/2016   K 3.5 04/23/2016   CL 103 04/23/2016   CREATININE 0.67 04/23/2016   BUN <5 (L) 04/23/2016   CO2 23 04/23/2016   INR 1.06 04/20/2016   Extubated erly Alert and awake bp and hr up- low dose iv lopressor  [name] MD  Beeper [phone] Office [phone] 04/23/2016 8:10 PM

## 2016-04-23 NOTE — Transfer of Care (Signed)
Immediate Anesthesia Transfer of Care Note  Patient: Fred [name]  Procedure(s) Performed: Procedure(s): VIDEO BRONCHOSCOPY (N/A) TRANSHIATIAL TOTAL ESOPHAGECTOMY COMPLETE; CERVICAL ESOPHAGOGASTROSTOMY AND PYLOROPLASTY (N/A) FEEDING JEJUNOSTOMY (N/A) CHEST TUBE INSERTION (Left)  Patient Location: SICU  Anesthesia Type:General  Level of Consciousness: sedated and Patient remains intubated per anesthesia plan  Airway & Oxygen Therapy: Patient remains intubated per anesthesia plan and Patient placed on Ventilator (see vital sign flow sheet for setting)  Post-op Assessment: Report given to RN, Post -op Vital signs reviewed and stable, Patient moving all extremities and Patient moving all extremities X 4  Post vital signs: Reviewed and stable  Last Vitals:  Vitals:   04/23/16 0614 04/23/16 0620  BP: (!) 135/56   Pulse: 64   Resp: 20   Temp:  36.7 C    Last Pain:  Vitals:   04/23/16 0614  TempSrc: Oral         Complications: No apparent anesthesia complications

## 2016-04-23 NOTE — Brief Op Note (Signed)
      [address]       [place], [address]             [phone]      04/23/2016  3:11 PM  PATIENT:  Fred [name]  75 y.o. male  PRE-OPERATIVE DIAGNOSIS:  ESOPHAGEAL CANCER  POST-OPERATIVE DIAGNOSIS:  ESOPHAGEAL CANCER  PROCEDURE:  Procedure(s): VIDEO BRONCHOSCOPY (N/A) TRANSHIATIAL TOTAL ESOPHAGECTOMY COMPLETE (N/A) FEEDING JEJUNOSTOMY (N/A) CHEST TUBE INSERTION (Left) Cervical esophagogastric anastomosis pyloroplasty       SURGEON:  Surgeon(s) and Role:    * [name], MD - Primary   ASSISTANTS: Wyvonnia Lora PA  ANESTHESIA:   general  EBL:  Total I/O In: 5000 [I.V.:4000; IV Piggyback:1000] Out: 3500 [Urine:3000; Blood:500]  BLOOD ADMINISTERED:none  DRAINS: Penrose drain in the left neck, Nasogastric Tube, Urinary Catheter (Foley) and Jejunostomy Tube   LOCAL MEDICATIONS USED:  LIDOCAINE   SPECIMEN:  Source of Specimen:  esopahgus   DISPOSITION OF SPECIMEN:  PATHOLOGY  COUNTS:  YES  DICTATION: .Dragon Dictation  PLAN OF CARE: Admit to inpatient   PATIENT DISPOSITION:  ICU - extubated and stable.   Delay start of Pharmacological VTE agent (>24hrs) due to surgical blood loss or risk of bleeding: yes

## 2016-04-23 NOTE — Anesthesia Procedure Notes (Signed)
Central Venous Catheter Insertion Performed by: anesthesiologist Patient location: Pre-op. Preanesthetic checklist: patient identified, IV checked, site marked, risks and benefits discussed, surgical consent, monitors and equipment checked, pre-op evaluation, timeout performed and anesthesia consent Position: supine Lidocaine 1% used for infiltration Landmarks identified Catheter size: 8 Fr Central line was placed.Double lumen Procedure performed using ultrasound guided technique. Attempts: 1 Following insertion, dressing applied, line sutured and Biopatch. Post procedure assessment: blood return through all ports, free fluid flow and no air. Patient tolerated the procedure well with no immediate complications.

## 2016-04-23 NOTE — Anesthesia Procedure Notes (Signed)
Procedure Name: Intubation Date/Time: 04/23/2016 8:18 AM Performed by: Jacquiline Doe A Pre-anesthesia Checklist: Patient identified, Emergency Drugs available, Suction available and Patient being monitored Patient Re-evaluated:Patient Re-evaluated prior to inductionOxygen Delivery Method: Circle System Utilized and Circle system utilized Preoxygenation: Pre-oxygenation with 100% oxygen Intubation Type: IV induction and Cricoid Pressure applied Ventilation: Mask ventilation without difficulty Laryngoscope Size: Mac and 4 Grade View: Grade I Tube type: Oral Tube size: 8.0 (NIM OETT .) mm Number of attempts: 1 Airway Equipment and Method: Stylet and Oral airway Placement Confirmation: ETT inserted through vocal cords under direct vision,  positive ETCO2 and breath sounds checked- equal and bilateral Secured at: 23 cm Tube secured with: Tape Dental Injury: Teeth and Oropharynx as per pre-operative assessment  Comments: #8.0 NIM OETT .

## 2016-04-23 NOTE — H&P (Signed)
[address]       [place] [address]             [place] Record #[id number] Date of Birth: 1941-04-03  Referring: Dr Burr Medico Primary Care: [name], MD Cardiology: Dr Percival Spanish   Chief Complaint:    Esophageal Cancer   History of Present Illness:    Fred [name] 75 y.o. male was originally seen in the office at his request for a second surgical opinion concerning his  diagnosed adenocarcinoma the distal esophagus. The patient gives a history of many years of reflux symptoms taking acid blocking medications. For  3 months he had noted increasing difficulty swallowing specially solid food and bread. He was referred by the [facility] to [place] were upper GI endoscopy was performed a near obstructing mass 6 cm lower third of the esophagus was noted. 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 The  patient underwent esophageal ultrasound at [facility] an ulcerated mass was noted from 27-35 cm in the setting of Barrett's esophagus mass to involve the GE junction but not the cardia. By EUS criteria the lesion was T3 with breaks into the muscularis propria total thickness 9 mm, there was present regional lymph nodes, staged N1. . The patient Com[facility].  The patient completed radiation therapy July 13. He became catabolic losing weight and ultimately required placement of a panda feeding tube.  He comes to the office today feeling much better to the point he has been out mowing his lawn. His by mouth intake over the past 2 weeks has significantly increased to the point on his last visit I told him if his feeding tube clogged to just remove it, which he has done and continues on a by mouth diet .   Wt Readings from Last 3 Encounters:  04/23/16 126 lb (57.2 kg)    04/20/16 126 lb 3.2 oz (57.2 kg)  04/12/16 125 lb 6.4 oz (56.9 kg)     There is a family history of gastric cancer in his maternal grandfather, patient's father died of a stroke in his 48s mother died in her 81s of Alzheimer.   Patient notes that when his symptoms first began he had chest discomfort and was referred to the [facility] ER from the [facility], he notes a stress test and echocardiogram were done at the [place] per his report he was told he had no cardiac issues- his wife will assist in obtaining these records. Patient saw Dr. Percival Spanish  last week for preop cardiac clearance, no further evaluation was thought to be needed by him.  Because of questionable abnormalities in his liver and left adrenal and MRI of the liver and adrenals was done this past week. The patient comes in today for reevaluation of his nutritional status and to plan surgical resection.  He was seen by Cardioliogy preop   Current Activity/ Functional Status:  Patient is independent with mobility/ambulation, transfers, ADL's, IADL's.   Zubrod Score: At the time of surgery this patient's most appropriate activity status/level should be described as: _0     0    Normal activity, no symptoms _1     1    Restricted in physical strenuous activity but ambulatory, able to do  out light work _0     2    Ambulatory and capable of self care, unable to do work activities, up and about               >50 % of waking hours                              _1     3    Only limited self care, in bed greater than 50% of waking hours _2     4    Completely disabled, no self care, confined to bed or chair _3     5    Moribund   Past Medical History:  Diagnosis Date  . Esophageal cancer ([place]) 11/23/15   lower 3rd esohagus   . GERD (gastroesophageal reflux disease)   . Hypertension     Past Surgical History:  Procedure Laterality Date  . cystoscope  4/12/1   prostate  . ERCP    . INGUINAL HERNIA REPAIR    . LEG SURGERY Left    hole   . PROSTATE BIOPSY  10/05/15  . right shoulder surgery      Family History  Problem Relation Age of Onset  . Cancer Maternal Grandfather 3    gastric cancer   . Alzheimer's disease Mother   . Diabetes Mother   . Stroke Father 58   Stomach cancer Maternal Grandfather   Social History   Social History  . Marital status: Married    Spouse name: N/A  . Number of children: 1  . Years of education: N/A   Occupational History  . Truck Geophysicist/field seismologist    Social History Main Topics  . Smoking status: Former Smoker    Packs/day: 1.00    Years: 10.00    Quit date: 07/24/1967  . Smokeless tobacco: Never Used  . Alcohol use No  . Drug use: No  . Sexual activity: Not Currently   Other Topics Concern  . Not on file   Social History Narrative  . No narrative on file    History  Smoking Status  . Former Smoker  . Packs/day: 1.00  . Years: 10.00  . Quit date: 07/24/1967  Smokeless Tobacco  . Never Used      Allergies  Allergen Reactions  . No Known Allergies     Current Facility-Administered Medications  Medication Dose Route Frequency Provider Last Rate Last Dose  . cefUROXime (ZINACEF) 1.5 g in dextrose 5 % 50 mL IVPB  1.5 g Intravenous 60 min Pre-Op [name], MD       Facility-Administered Medications Ordered in Other Encounters  Medication Dose Route Frequency Provider Last Rate Last Dose  . 0.9 %  sodium chloride infusion   Intravenous Once Truitt Merle, MD          Review of Systems:     Cardiac Review of Systems: Y or N  Chest Pain [y evaluated at [facility] last month and seen by [facility] ]  Resting SOB [ n  ] Exertional SOB  [n  ]  Orthopnea [ n ]   Pedal Edema [ n  ]    Palpitations [ n ] Syncope  [n  ]   Presyncope [ n  ]  General Review of Systems: [Y] = yes [  ]=no Constitional: recent weight change [  ];  Wt loss over the last 3 months [   ] anorexia [  ];  fatigue [  ]; nausea [  ]; night sweats [  ]; fever [  ]; or chills [  ];          Dental: poor  dentition[  ]; Last Dentist visit:   Eye : blurred vision [  ]; diplopia [   ]; vision changes [  ];  Amaurosis fugax[  ]; Resp: cough [  ];  wheezing[  ];  hemoptysis[  ]; shortness of breath[  ]; paroxysmal nocturnal dyspnea[  ]; dyspnea on exertion[  ]; or orthopnea[  ];  GI:  gallstones[  ], vomiting[  ];  dysphagia[ y ]; [place] Muta  ];  hematochezia [  ]; heartburn[  ];   Hx of  Colonoscopy[ y ]; GU: kidney stones [  ]; hematuria[  ];   dysuria [  ];  nocturia[  ];  history of     obstruction [  ]; urinary frequency Blue.Reese  ]             Skin: rash, swelling[  ];, hair loss[  ];  peripheral edema[  ];  or itching[  ]; Musculosketetal: myalgias[  ];  joint swelling[  ];  joint erythema[  ];  joint pain[  ];  back pain[  ];  Heme/Lymph: bruising[  ];  bleeding[  ];  anemia[  ];  Neuro: TIA[  ];  headaches[  ];  stroke[  ];  vertigo[  ];  seizures[  ];   paresthesias[  ];  difficulty walking[chronic injury to left leg  ];  Psych:depression[n  ]; anxiety[ n ];  Endocrine: diabetes[  ];  thyroid dysfunction[  ];  Immunizations: Flu up to date [ y]; Pneumococcal up to date [  y];  Other:  Physical Exam: BP (!) 135/56   Pulse 64   Temp 98 F (36.7 C)   Resp 20   Ht _0  (1.651 m)   Wt 126 lb (57.2 kg)   SpO2 100%   BMI 20.97 kg/m   Wt Readings from Last 3 Encounters:  04/23/16 126 lb (57.2 kg)  04/20/16 126 lb 3.2 oz (57.2 kg)  04/12/16 125 lb 6.4 oz (56.9 kg)   PHYSICAL EXAMINATION: Overall the patient looks much better, no feeding tube in place General appearance: alert, cooperative and no distress,   Head: Normocephalic, without obvious abnormality, atraumatic Neck: no adenopathy, no carotid bruit, no JVD, supple, symmetrical, trachea midline and thyroid not enlarged, symmetric, no tenderness/mass/nodules Lymph nodes: Cervical, supraclavicular, and axillary nodes normal. Resp: clear to auscultation bilaterally Back: symmetric, no curvature. ROM normal. No CVA tenderness. Cardio:  regular rate and rhythm, S1, S2 normal, no murmur, click, rub or gallop GI: soft, non-tender; bowel sounds normal; no masses,  no organomegaly Extremities: extremities normal, atraumatic, no cyanosis or edema Neurologic: Grossly normal  Diagnostic Studies & Laboratory data:  Ct Chest W Contrast & Ct Abdomen W Contrast  Result Date: 03/20/2016 CLINICAL DATA:  New distal esophageal carcinoma. Status post neoadjuvant radiation therapy and chemotherapy. Restaging. EXAM: CT CHEST AND ABDOMEN WITH CONTRAST TECHNIQUE: Multidetector CT imaging of the chest and abdomen was performed following the standard protocol during bolus administration of intravenous contrast. CONTRAST:  165m ISOVUE-300 IOPAMIDOL (ISOVUE-300) INJECTION 61% [facility]  [facility] FINDINGS: CT CHEST WITH CONTRAST Cardiovascular: Normal heart size. Aortic atherosclerosis. Mild LAD coronary artery calcification also noted. Mediastinum/Lymph Nodes: No pathologically enlarged lymph nodes identified. A feeding  tube is seen in place. Small hiatal hernia again seen. Mild wall thickening of distal thoracic esophagus is again demonstrated, without significant change. Lungs/Pleura: No pulmonary mass, infiltrate, or effusion. Mild subpleural scarring and associated peripheral bronchiectasis seen in medial right lower lobe. Musculoskeletal: No chest wall mass or suspicious bone lesions identified. CT ABDOMEN WITH CONTRAST Hepatobiliary: Multiple tiny sub-cm low-attenuation lesions noted in both the right and left hepatic lobes are too small to characterize, but remain stable since prior CT 12/01/2015.  Gallbladder is unremarkable. Pancreas: No mass, inflammatory changes, or other significant abnormality. Spleen: Within normal limits in size and appearance. Adrenals/Urinary Tract: A 1.3 cm left adrenal nodule remains stable in size since previous study. This  showed hypermetabolic activity on prior PET scan, and is indeterminate. Normal appearance of right adrenal gland. Bilateral renal cysts are again seen, however there is no evidence of renal masses or hydronephrosis. Stomach/Bowel: No evidence of obstruction, inflammatory process, or abnormal fluid collections. Vascular/Lymphatic: No pathologically enlarged lymph nodes. No evidence of abdominal aortic aneurysm. Aortic atherosclerosis. Other: None. Musculoskeletal: No suspicious bone lesions identified. Old mild L1 vertebral body compression fracture deformity is stable since prior exam. IMPRESSION: Stable distal thoracic esophageal wall thickening and small hiatal hernia. No evidence of metastatic disease within the thorax. Stable 1.3 cm indeterminate left adrenal nodule. This showed hypermetabolic activity on prior PET scan. Consider continued attention on follow-up CT, or abdomen MRI without and with contrast for further characterization. Tiny sub-cm low-attenuation liver lesions are too small to characterize but remain stable, suggesting benign etiology. These could also be followed by CT, or evaluated further by MRI. Incidentally noted aortic atherosclerosis and coronary artery calcification. Electronically Signed   By: Earle Gell M.D.   On: 03/20/2016 16:40   Mr Liver W Wo Contrast  Result Date: 04/10/2016 CLINICAL DATA:  Esophageal cancer, possible liver lesion on CT EXAM: MRI ABDOMEN WITHOUT AND WITH CONTRAST TECHNIQUE: Multiplanar multisequence MR imaging of the abdomen was performed both before and after the administration of intravenous contrast. CONTRAST:  26m MULTIHANCE GADOBENATE DIMEGLUMINE 529 MG/ML IV SOLN COMPARISON:  CT abdomen/pelvis dated 03/20/2016 FINDINGS: Lower chest: Lung bases are clear. Hepatobiliary: Scattered benign hepatic cysts measuring up to 6 mm in the right hepatic lobe (series 8/image 6). 8 mm lesion in segment 7 (series 8/image 9), with suspected peripheral nodular  discontinuous enhancement (series 12/image 22), suggesting a benign hemangioma. Additional 10 mm T2 hyperintense lesion in segment 6 (series 8/image 13), with associated perfusion anomaly on the early arterial phase (series 12/image 33), and persistent central enhancement (series 19/image 35), possibly reflecting a hemangioma or vascular malformation. Metastatic disease is considered unlikely but is not entirely excluded. Gallbladder is unremarkable. No intrahepatic or extrahepatic ductal dilatation. Pancreas: 8 mm nonaggressive pancreatic cyst along the pancreatic tail (series 8/image 15). No pancreatic atrophy or ductal dilatation. Spleen:  Within normal limits. Adrenals/Urinary Tract: 12 mm left adrenal nodule (series 8/ image 15), unchanged. No definite intracellular lipid to suggest a benign adenoma. Right adrenal gland is within normal limits. Scattered bilateral renal cysts, including a 4.4 cm left upper pole renal cyst (series 8/ image 14) and a 5.9 cm right lower pole renal cyst (series 8/ image 26). No hydronephrosis. Stomach/Bowel: Stomach is notable for a tiny hiatal hernia. Visualized bowel is unremarkable. Vascular/Lymphatic:  No evidence of abdominal aortic aneurysm. No suspicious abdominal lymphadenopathy. Other:  No abdominal ascites. Musculoskeletal: No focal osseous lesions. IMPRESSION: 10 mm enhancing lesion in segment 6 favors a vascular malformation or  hemangioma, with metastatic disease considered unlikely. Consider follow CT or MR in 3-6 months, as clinically warranted 8 mm lesion in segment 7 is compatible with a benign hemangioma. Additional scattered hepatic cysts. 12 mm left adrenal nodule, unchanged, indeterminate. Metastasis is not excluded. **An incidental finding of potential clinical significance has been found. 8 mm nonaggressive pancreatic cyst along the pancreatic tail. Follow-up CT or MR abdomen with/without contrast is suggested in 1 year.** Electronically Signed   By: Julian Hy M.D.   On: 04/10/2016 16:23    Recent Lab Findings: Lab Results  Component Value Date   WBC 5.7 04/20/2016   HGB 13.6 04/20/2016   HCT 39.9 04/20/2016   PLT 198 04/20/2016   GLUCOSE 100 (H) 04/20/2016   ALT 19 04/20/2016   AST 26 04/20/2016   NA 138 04/20/2016   K 3.9 04/20/2016   CL 109 04/20/2016   CREATININE 0.57 (L) 04/20/2016   BUN 9 04/20/2016   CO2 21 (L) 04/20/2016   INR 1.06 04/20/2016   CT A/P with contrast 12/01/2015 Marked circumferential mid to distal esophageal wall thickening, no enlarged thoracic nodes or evidence of lung mets Small varices along the GE junction Periportal nodes measuring less than a centimeter. There is prominent upper abdominal node adjacent to the splenic vein/SMV confluence measuring 15x81m 13 mm left adrenal indeterminate nodule. Hypodense scattered liver lesions, the largest measuring 8 mm in the right, too small to characterize Enlarged heterogeneously enhancing prostate with ill-defined margins  12/15/2015 EGD/EUS 12/15/2015 Mass noted at 27CM from the incisors, by EUS criteria the lesion is T3 with breaks in the muscularis propria, total thickness 9 mm. Based on the presene of 1-2 metastatic regional lymph nodes, N staging was N1. The radial EUS would not pass through and did not dilate given clinical information already gleaned.  PATHOLOGY: Lower third of esophagus mass biopsy 11/23/2015 Mucinous adenocarcinoma, moderately differentiated.   PET:  IMPRESSION:  1.  Distal esophageal hypermetabolic mass consistent with known malignancy. 2.  Hypermetabolic activity in the left anterior prostate gland, correlate for malignancy. 3.  Metabolic density left adrenal nodule. 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 However, given history of malignancy, metastatic disease is not excluded. 4.  Left apical 2 mm pulmonary nodule, too small for evaluation by PET.   Result Narrative  INDICATION:  C15.5: Malignant neoplasm of lower third of esophagus  TECHNIQUE: 7.1 millicuries of [id number] was administered intravenously. PET imaging was obtained from the skull base to the mid thighs. CT images were obtained for attenuation correction and localization purposes. Glucose level was 103 MG/DL. Time from  injection to scan was 62 minutes.  Comparison none.  FINDINGS:  Hypermetabolic distal esophageal mass max SUV 8.5 (image 118) consistent with known malignancy.  Hypermetabolic activity within the left anterior prostate Max SUV 19.8, no mass identified by CT.  Low density left adrenal nodule measuring 1.4 cm (image 146). 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  Remaining activity in the body is normal and physiologic. Additional findings: Left apical 2 mm pulmonary nodule (image 66) 2 small for evaluation by PET. Gallbladder sludge. No wall thickening. Bilateral renal cysts. Nonobstructing mid left renal stone. Colonic diverticulosis   I have independently reviewed the above radiology studies  and reviewed the findings with the patient.  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 Patient has shownincrease in physical activity endurance and is now in a positive metabolic state gaining weight from his low of 112 up to 121 on his scale and 125 on the office  scale. He's been cleared by cardiology  I discussed with the patient is wife and daughter proceeding  with bronchoscopy transhiatal total esophagectomy and cervical esophagogastrostomy, feeding jejunostomy. The risks and options of surgery have been discussed with the patient and his family in detail on several occasions and he is willing to proceed. He has benefited from aggressive tube feedings in the. Treatment. And is now an anabolic state., Gaining weight and increasing his physical ability.  I had a detailed discussion with [name] regarding the magnitude of the surgical esophagectomy procedure as well as the risks, the expected benefits, and alternatives.  I quoted [name] 5% perioperative mortality rate and a complication rate as high as 40%.  We specifically discussed complications, which include, but were not limited to: recurrent nerve injury with possible permanent hoarseness, anastomotic leak, airway and great vessel injury, conduit ischemia, thoracic duct leak, the inability to complete the operation via a transhiatal approach requiring a right thoracotomy,  bleeding, need for blood transfusion and the potential need for ventilator support.  Fred [name] has had questions answered is well informed and willing to proceed.    [name] MD      [address] [place] [address] Office [phone]   Beeper [phone]  04/23/2016 7:10 AM

## 2016-04-23 NOTE — Anesthesia Preprocedure Evaluation (Signed)
Anesthesia Evaluation  Patient identified by MRN, date of birth, ID band Patient awake    Reviewed: Allergy & Precautions, NPO status , Patient's Chart, lab work & pertinent test results, reviewed documented beta blocker date and time   History of Anesthesia Complications Negative for: history of anesthetic complications  Airway Mallampati: II  TM Distance: >3 FB Neck ROM: Full    Dental  (+) Teeth Intact   Pulmonary neg shortness of breath, neg COPD, neg recent URI, former smoker,    breath sounds clear to auscultation       Cardiovascular hypertension, Pt. on home beta blockers and Pt. on medications (-) angina(-) CHF  Rhythm:Regular     Neuro/Psych negative neurological ROS  negative psych ROS   GI/Hepatic Neg liver ROS, GERD  ,  Endo/Other  negative endocrine ROS  Renal/GU negative Renal ROS     Musculoskeletal   Abdominal   Peds  Hematology negative hematology ROS (+)   Anesthesia Other Findings   Reproductive/Obstetrics                             Anesthesia Physical Anesthesia Plan  ASA: III  Anesthesia Plan: General   Post-op Pain Management:    Induction: Intravenous  Airway Management Planned: Oral ETT  Additional Equipment: Arterial line, CVP and Ultrasound Guidance Line Placement  Intra-op Plan:   Post-operative Plan: Extubation in OR  Informed Consent: I have reviewed the patients History and Physical, chart, labs and discussed the procedure including the risks, benefits and alternatives for the proposed anesthesia with the patient or authorized representative who has indicated his/her understanding and acceptance.   Dental advisory given  Plan Discussed with: CRNA and Surgeon  Anesthesia Plan Comments:         Anesthesia Quick Evaluation

## 2016-04-24 ENCOUNTER — Inpatient Hospital Stay (HOSPITAL_COMMUNITY): Payer: Non-veteran care

## 2016-04-24 ENCOUNTER — Encounter (HOSPITAL_COMMUNITY): Payer: Self-pay | Admitting: Cardiothoracic Surgery

## 2016-04-24 LAB — BASIC METABOLIC PANEL
ANION GAP: 5 (ref 5–15)
BUN: <5 mg/dL — ABNORMAL LOW (ref 6–20)
CALCIUM: 8.3 mg/dL — AB (ref 8.9–10.3)
CO2: 23 mmol/L (ref 22–32)
Chloride: 105 mmol/L (ref 101–111)
Creatinine, Ser: 0.65 mg/dL (ref 0.61–1.24)
Glucose, Bld: 183 mg/dL — ABNORMAL HIGH (ref 65–99)
POTASSIUM: 4.1 mmol/L (ref 3.5–5.1)
SODIUM: 133 mmol/L — AB (ref 135–145)

## 2016-04-24 LAB — GLUCOSE, CAPILLARY
Glucose-Capillary: 154 mg/dL — ABNORMAL HIGH (ref 65–99)
Glucose-Capillary: 110 mg/dL — ABNORMAL HIGH (ref 65–99)
Glucose-Capillary: 150 mg/dL — ABNORMAL HIGH (ref 65–99)
Glucose-Capillary: 142 mg/dL — ABNORMAL HIGH (ref 65–99)
Glucose-Capillary: 116 mg/dL — ABNORMAL HIGH (ref 65–99)

## 2016-04-24 LAB — CBC
HCT: 34.6 % — ABNORMAL LOW (ref 39.0–52.0)
Hemoglobin: 11.5 g/dL — ABNORMAL LOW (ref 13.0–17.0)
MCH: 30.8 pg (ref 26.0–34.0)
MCHC: 33.2 g/dL (ref 30.0–36.0)
MCV: 92.8 fL (ref 78.0–100.0)
PLATELETS: 183 10*3/uL (ref 150–400)
RBC: 3.73 MIL/uL — AB (ref 4.22–5.81)
RDW: 13 % (ref 11.5–15.5)
WBC: 12.6 10*3/uL — AB (ref 4.0–10.5)

## 2016-04-24 LAB — POCT I-STAT 3, ART BLOOD GAS (G3+)
Acid-base deficit: 2 mmol/L (ref 0.0–2.0)
Bicarbonate: 22.4 mmol/L (ref 20.0–28.0)
O2 Saturation: 97 %
Patient temperature: 98.5
TCO2: 24 mmol/L (ref 0–100)
pCO2 arterial: 37.3 mmHg (ref 32.0–48.0)
pH, Arterial: 7.387 (ref 7.350–7.450)
pO2, Arterial: 96 mmHg (ref 83.0–108.0)

## 2016-04-24 LAB — SURGICAL PCR SCREEN
MRSA, PCR: NEGATIVE
Staphylococcus aureus: POSITIVE — AB

## 2016-04-24 MED ORDER — MUPIROCIN 2 % EX OINT
1.0000 "application " | TOPICAL_OINTMENT | Freq: Two times a day (BID) | CUTANEOUS | Status: AC
  Administered 2016-04-24 – 2016-04-29 (×10): 1 via NASAL
  Filled 2016-04-24 (×2): qty 22

## 2016-04-24 MED ORDER — ENOXAPARIN SODIUM 30 MG/0.3ML ~~LOC~~ SOLN
30.0000 mg | SUBCUTANEOUS | Status: DC
  Administered 2016-04-24 – 2016-05-04 (×11): 30 mg via SUBCUTANEOUS
  Filled 2016-04-24 (×11): qty 0.3

## 2016-04-24 MED ORDER — CHLORHEXIDINE GLUCONATE CLOTH 2 % EX PADS
6.0000 | MEDICATED_PAD | Freq: Every day | CUTANEOUS | Status: AC
  Administered 2016-04-25 – 2016-04-29 (×5): 6 via TOPICAL

## 2016-04-24 MED ORDER — KETOROLAC TROMETHAMINE 15 MG/ML IJ SOLN
INTRAMUSCULAR | Status: AC
  Administered 2016-04-24: 15 mg via INTRAVENOUS
  Filled 2016-04-24: qty 1

## 2016-04-24 MED ORDER — KETOROLAC TROMETHAMINE 15 MG/ML IJ SOLN
15.0000 mg | Freq: Four times a day (QID) | INTRAMUSCULAR | Status: AC | PRN
  Administered 2016-04-24 (×2): 15 mg via INTRAVENOUS
  Filled 2016-04-24: qty 1

## 2016-04-24 MED ORDER — INSULIN ASPART 100 UNIT/ML ~~LOC~~ SOLN
0.0000 [IU] | SUBCUTANEOUS | Status: DC
  Administered 2016-04-24 – 2016-04-27 (×12): 2 [IU] via SUBCUTANEOUS

## 2016-04-24 NOTE — Progress Notes (Signed)
Called to bedside by pt d/T NG tube partially coiled in mouth Pt unsure how long tube has been coiled in mouth. Pt instructed not to attempt to talk or use tong to manipulate tube in mouth. Dr Servando Snare notified orders received. .......................................................................................;;;;;

## 2016-04-24 NOTE — Progress Notes (Signed)
Patient ID: Fred [name], male   DOB: January 19, 1941, 75 y.o.   MRN: [id number] TCTS DAILY ICU PROGRESS NOTE                   [address]            [place] [address]          [phone]   1 Day Post-Op Procedure(s) (LRB): VIDEO BRONCHOSCOPY (N/A) TRANSHIATIAL TOTAL ESOPHAGECTOMY COMPLETE; CERVICAL ESOPHAGOGASTROSTOMY AND PYLOROPLASTY (N/A) FEEDING JEJUNOSTOMY (N/A) CHEST TUBE INSERTION (Left)  Total Length of Stay:  LOS: 1 day   Subjective: Awake and alert, pain control with iv med adquate  Objective: Vital signs in last 24 hours: Temp:  [97.6 F (36.4 C)-98.5 F (36.9 C)] 98.5 F (36.9 C) (10/03 0319) Pulse Rate:  [99-124] 115 (10/03 0700) Resp:  [0-29] 22 (10/03 0700) BP: (105-162)/(62-90) 107/63 (10/03 0700) SpO2:  [92 %-100 %] 99 % (10/03 0700) Arterial Line BP: (121-199)/(40-98) 124/40 (10/03 0700) FiO2 (%):  [40 %-50 %] 40 % (10/02 1550) Weight:  [129 lb 13.6 oz (58.9 kg)] 129 lb 13.6 oz (58.9 kg) (10/03 0400)  Filed Weights   04/23/16 0614 04/24/16 0400  Weight: 126 lb (57.2 kg) 129 lb 13.6 oz (58.9 kg)    Weight change: 3 lb 13.6 oz (1.747 kg)   Hemodynamic parameters for last 24 hours:    Intake/Output from previous day: 10/02 0701 - 10/03 0700 In: 7673.3 [I.V.:5763.3; NG/GT:90; IV Piggyback:1700] Out: [id number] [Urine:4100; Emesis/NG output:50; Blood:500; Chest Tube:220]  Intake/Output this shift: No intake/output data recorded.  Current Meds: Scheduled Meds: . cefOXitin  2 g Intravenous Q6H  . chlorhexidine  15 mL Mouth Rinse BID  . famotidine (PEPCID) IV  20 mg Intravenous Q12H  . mouth rinse  15 mL Mouth Rinse BID  . mouth rinse  15 mL Mouth Rinse q12n4p  . metoCLOPramide (REGLAN) injection  10 mg Intravenous Q6H  . sodium chloride flush  10 mL Intravenous Q12H   Continuous Infusions: . dexmedetomidine    . dextrose 5 % and 0.45 % NaCl with KCl 20 mEq/L 100 mL/hr at 04/24/16 0600   PRN Meds:.albuterol, hydrALAZINE, ketorolac,  metoprolol, morphine injection, ondansetron (ZOFRAN) IV, potassium chloride  General appearance: alert and cooperative Neurologic: intact Heart: regular rate and rhythm, S1, S2 normal, no murmur, click, rub or gallop Lungs: diminished breath sounds bibasilar Abdomen: soft, non-tender; bowel sounds normal; no masses,  no organomegaly Extremities: extremities normal, atraumatic, no cyanosis or edema and Homans sign is negative, no sign of DVT Wound: dressing intact, ng coiled in mouth , loop pulled out   Lab Results: CBC: Recent Labs  04/23/16 1607 04/24/16 0342  WBC 10.2 12.6*  HGB 11.4* 11.5*  HCT 34.0* 34.6*  PLT 168 183   BMET:  Recent Labs  04/23/16 1607 04/24/16 0342  NA 136 133*  K 3.5 4.1  CL 103 105  CO2 23 23  GLUCOSE 195* 183*  BUN <5* <5*  CREATININE 0.67 0.65  CALCIUM 8.4* 8.3*    CMET: Lab Results  Component Value Date   WBC 12.6 (H) 04/24/2016   HGB 11.5 (L) 04/24/2016   HCT 34.6 (L) 04/24/2016   PLT 183 04/24/2016   GLUCOSE 183 (H) 04/24/2016   ALT 19 04/20/2016   AST 26 04/20/2016   NA 133 (L) 04/24/2016   K 4.1 04/24/2016   CL 105 04/24/2016   CREATININE 0.65 04/24/2016   BUN <5 (L) 04/24/2016   CO2 23  04/24/2016   INR 1.06 04/20/2016    PT/INR: No results for input(s): LABPROT, INR in the last 72 hours. Radiology: Dg Chest Port 1 View  Result Date: 04/24/2016 CLINICAL DATA:  Esophageal cancer.  Hypertension.  GERD. EXAM: PORTABLE CHEST 1 VIEW COMPARISON:  04/23/2016.  04/20/2016.  CT 03/20/2016. FINDINGS: NG tube tip noted at the gastroesophageal junction. Advancement of 12 cm should be considered if it is desired that the tip of the endotracheal tube should be in the stomach. Right IJ line and left chest tube in stable position. No pneumothorax. Cardiomegaly with mild pulmonary venous congestion. Mild bibasilar atelectasis and/or infiltrates/edema noted. Small left pleural effusion cannot be excluded. IMPRESSION: 1. NG tube tip noted at the  gastroesophageal junction. Advancement of approximately 12 cm should be considered if desired for the tip and side hole of the endotracheal tube should be in the stomach in this patient with known esophageal cancer. 2. Right IJ line and left chest tube in stable position. No pneumothorax. 3. Cardiomegaly with pulmonary venous congestion. Mild bibasilar atelectasis and/or infiltrates/edema noted. Small left pleural effusion. A component of congestive heart failure may be present . These results will be called to the ordering clinician or representative by the Radiologist Assistant, and communication documented in the PACS or zVision Dashboard. Electronically Signed   By: Marcello Moores  Register   On: 04/24/2016 07:22   Dg Chest Port 1 View  Result Date: 04/23/2016 CLINICAL DATA:  Status post esophagectomy. EXAM: PORTABLE CHEST 1 VIEW COMPARISON:  PET-CT scan 12/05/2015, chest radiograph 04/20/2016 FINDINGS: NG to extends to the level of the diaphragm centrally. Endotracheal to 4.3 cm from carina. RIGHT central venous line noted. No pneumothorax. LEFT basilar atelectasis. LEFT chest tube in place. Small LEFT apical pneumothorax. IMPRESSION: 1. Small LEFT apical pneumothorax with chest tube in place. 2. NG tube, endotracheal tube and RIGHT central venous line in good position. 3. LEFT lower lobe atelectasis. Electronically Signed   By: Suzy Bouchard M.D.   On: 04/23/2016 15:59     Assessment/Plan: S/P Procedure(s) (LRB): VIDEO BRONCHOSCOPY (N/A) TRANSHIATIAL TOTAL ESOPHAGECTOMY COMPLETE; CERVICAL ESOPHAGOGASTROSTOMY AND PYLOROPLASTY (N/A) FEEDING JEJUNOSTOMY (N/A) CHEST TUBE INSERTION (Left) Mobilize Diabetes control Continue foley due to strict I&O See progression orders Expected Acute  Blood - loss Anemia    Fred [name] 04/24/2016 7:25 AM

## 2016-04-24 NOTE — Progress Notes (Signed)
TCTS BRIEF SICU PROGRESS NOTE  1 Day Post-Op  S/P Procedure(s) (LRB): VIDEO BRONCHOSCOPY (N/A) TRANSHIATIAL TOTAL ESOPHAGECTOMY COMPLETE; CERVICAL ESOPHAGOGASTROSTOMY AND PYLOROPLASTY (N/A) FEEDING JEJUNOSTOMY (N/A) CHEST TUBE INSERTION (Left)   Stable day Sinus tach w/ stable BP O2 sats 98% on 2 L/min UOP adequate  Plan: Continue current plan  Rexene Alberts, MD 04/24/2016 6:16 PM

## 2016-04-25 ENCOUNTER — Inpatient Hospital Stay (HOSPITAL_COMMUNITY): Payer: Non-veteran care

## 2016-04-25 DIAGNOSIS — E440 Moderate protein-calorie malnutrition: Secondary | ICD-10-CM | POA: Insufficient documentation

## 2016-04-25 LAB — CBC
HCT: 32.8 % — ABNORMAL LOW (ref 39.0–52.0)
Hemoglobin: 10.9 g/dL — ABNORMAL LOW (ref 13.0–17.0)
MCH: 31.2 pg (ref 26.0–34.0)
MCHC: 33.2 g/dL (ref 30.0–36.0)
MCV: 94 fL (ref 78.0–100.0)
Platelets: 151 10*3/uL (ref 150–400)
RBC: 3.49 MIL/uL — ABNORMAL LOW (ref 4.22–5.81)
RDW: 13.1 % (ref 11.5–15.5)
WBC: 11.5 10*3/uL — ABNORMAL HIGH (ref 4.0–10.5)

## 2016-04-25 LAB — BASIC METABOLIC PANEL
Anion gap: 5 (ref 5–15)
BUN: <5 mg/dL — ABNORMAL LOW (ref 6–20)
CO2: 25 mmol/L (ref 22–32)
Calcium: 8.2 mg/dL — ABNORMAL LOW (ref 8.9–10.3)
Chloride: 104 mmol/L (ref 101–111)
Creatinine, Ser: 0.54 mg/dL — ABNORMAL LOW (ref 0.61–1.24)
GFR calc Af Amer: >60 mL/min (ref >60)
GFR calc non Af Amer: >60 mL/min (ref >60)
Glucose, Bld: 157 mg/dL — ABNORMAL HIGH (ref 65–99)
Potassium: 3.8 mmol/L (ref 3.5–5.1)
Sodium: 134 mmol/L — ABNORMAL LOW (ref 135–145)

## 2016-04-25 LAB — GLUCOSE, CAPILLARY
Glucose-Capillary: 121 mg/dL — ABNORMAL HIGH (ref 65–99)
Glucose-Capillary: 136 mg/dL — ABNORMAL HIGH (ref 65–99)
Glucose-Capillary: 138 mg/dL — ABNORMAL HIGH (ref 65–99)
Glucose-Capillary: 153 mg/dL — ABNORMAL HIGH (ref 65–99)
Glucose-Capillary: 155 mg/dL — ABNORMAL HIGH (ref 65–99)
Glucose-Capillary: 91 mg/dL (ref 65–99)

## 2016-04-25 MED ORDER — VITAL AF 1.2 CAL PO LIQD
1000.0000 mL | ORAL | Status: DC
  Administered 2016-04-25: 1000 mL

## 2016-04-25 MED ORDER — METOPROLOL TARTRATE 25 MG/10 ML ORAL SUSPENSION
12.5000 mg | Freq: Two times a day (BID) | ORAL | Status: DC
  Administered 2016-04-25 – 2016-04-26 (×3): 12.5 mg via JEJUNOSTOMY
  Filled 2016-04-25 (×3): qty 5

## 2016-04-25 NOTE — Progress Notes (Signed)
Initial Nutrition Assessment  DOCUMENTATION CODES:   Non-severe (moderate) malnutrition in context of chronic illness  INTERVENTION:    Initiate Vital AF 1.2 formula at 20 ml/hr.  Advancement per MD discretion.  Recommend goal rate of 70 ml/hr.  NUTRITION DIAGNOSIS:   Inadequate oral intake related to inability to eat (s/p esophagectomy) as evidenced by NPO status  GOAL:   Patient will meet greater than or equal to 90% of their needs  MONITOR:   TF tolerance, Labs, Weight trends, Skin, I & O's  REASON FOR ASSESSMENT:   Consult Enteral/tube feeding initiation and management  ASSESSMENT:   75 y.o. Male seen in the office at his request for a second surgical opinion concerning his diagnosed adenocarcinoma the distal esophagus. The patient gives a history of many years of reflux symptoms taking acid blocking medications. For 3 months he had noted increasing difficulty swallowing specially solid food and bread. The patient completed radiation and chemotherapy at [facility].   Patient s/p procedures 10/2: TRANSHIATIAL TOTAL ESOPHAGECTOMY COMPLETE  FEEDING JEJUNOSTOMY  CHEST TUBE INSERTION (Left) Cervical esophagogastric anastomosis Pyloroplasty   RD spoke with pt and pt's wife at bedside. PTA pt had small bore feeding tube for approximately 6 weeks; was receiving continuous TF with Osmolite 1.5 formula. Wife reports he would have a "burning" sensation when swallowing some foods. He was able to tolerate ice cream, mashed potatoes, popsicles, soups and finely chopped meats. Weight had dropped to 112 lbs at some point in time, however, has been stable for the last 3 months.  Nutrition-Focused physical exam completed. Findings are moderate fat depletion, moderate muscle depletion, and no edema.   Diet Order:  Diet NPO time specified   CBG (last 3)   Recent Labs  04/25/16 0403 04/25/16 0807 04/25/16 1205  GLUCAP 153* 136* 121*    Skin:  Reviewed, no  issues  Last BM:  N/A  Height:   Ht Readings from Last 1 Encounters:  04/23/16 5\' 5"  (1.651 m)    Weight:   Wt Readings from Last 1 Encounters:  04/24/16 129 lb 13.6 oz (58.9 kg)    Wt Readings from Last 20 Encounters:  04/24/16 129 lb 13.6 oz (58.9 kg)  04/20/16 126 lb 3.2 oz (57.2 kg)  04/12/16 125 lb 6.4 oz (56.9 kg)  04/05/16 125 lb 12.8 oz (57.1 kg)  03/29/16 125 lb 12.8 oz (57.1 kg)  03/20/16 120 lb 8 oz (54.7 kg)  03/06/16 120 lb 14.4 oz (54.8 kg)  03/06/16 120 lb (54.4 kg)  02/29/16 119 lb 11.2 oz (54.3 kg)  02/25/16 123 lb (55.8 kg)  02/23/16 123 lb (55.8 kg)  02/15/16 129 lb (58.5 kg)  02/15/16 128 lb 9.6 oz (58.3 kg)  02/08/16 121 lb 1.6 oz (54.9 kg)  02/02/16 122 lb (55.3 kg)  02/01/16 124 lb 11.2 oz (56.6 kg)  01/30/16 124 lb (56.2 kg)  01/27/16 125 lb 14.4 oz (57.1 kg)  01/23/16 127 lb 6.4 oz (57.8 kg)  01/20/16 130 lb 3.2 oz (59.1 kg)    Ideal Body Weight:  62 kg  BMI:  Body mass index is 21.61 kg/m.  Estimated Nutritional Needs:   Kcal:  2000-2200  Protein:  115-125 gm  Fluid:  2.0-2.2 L  EDUCATION NEEDS:   No education needs identified at this time  Arthur Holms, RD, LDN Pager #: [phone] After-Hours Pager #: [phone]

## 2016-04-25 NOTE — Progress Notes (Signed)
Patient ID: Fred [name], male   DOB: May 22, 1941, 75 y.o.   MRN: [id number]   SICU Evening Rounds:   Hemodynamically stable   Urine output good   CT output low  CBC    Component Value Date/Time   WBC 11.5 (H) 04/25/2016 0326   RBC 3.49 (L) 04/25/2016 0326   HGB 10.9 (L) 04/25/2016 0326   HGB 12.0 (L) 02/24/2016 1301   HCT 32.8 (L) 04/25/2016 0326   HCT 34.7 (L) 02/24/2016 1301   PLT 151 04/25/2016 0326   PLT 266 02/24/2016 1301   MCV 94.0 04/25/2016 0326   MCV 90.3 02/24/2016 1301   MCH 31.2 04/25/2016 0326   MCHC 33.2 04/25/2016 0326   RDW 13.1 04/25/2016 0326   RDW 18.5 (H) 02/24/2016 1301   LYMPHSABS 2.1 02/24/2016 1301   MONOABS 0.7 02/24/2016 1301   EOSABS 0.0 02/24/2016 1301   BASOSABS 0.1 02/24/2016 1301     BMET    Component Value Date/Time   NA 134 (L) 04/25/2016 0326   NA 135 (L) 02/24/2016 1301   K 3.8 04/25/2016 0326   K 3.4 (L) 02/24/2016 1301   CL 104 04/25/2016 0326   CO2 25 04/25/2016 0326   CO2 26 02/24/2016 1301   GLUCOSE 157 (H) 04/25/2016 0326   GLUCOSE 106 02/24/2016 1301   BUN <5 (L) 04/25/2016 0326   BUN 9.6 02/24/2016 1301   CREATININE 0.54 (L) 04/25/2016 0326   CREATININE 0.5 (L) 02/24/2016 1301   CALCIUM 8.2 (L) 04/25/2016 0326   CALCIUM 8.4 02/24/2016 1301   GFRNONAA >60 04/25/2016 0326   GFRAA >60 04/25/2016 0326     A/P:  Stable postop course. Continue current plans

## 2016-04-25 NOTE — Progress Notes (Signed)
Patient ID: Fred [name], male   DOB: May 03, 1941, 75 y.o.   MRN: [id number] TCTS DAILY ICU PROGRESS NOTE                   [address]            [place] [address]          [phone]   2 Days Post-Op Procedure(s) (LRB): VIDEO BRONCHOSCOPY (N/A) TRANSHIATIAL TOTAL ESOPHAGECTOMY COMPLETE; CERVICAL ESOPHAGOGASTROSTOMY AND PYLOROPLASTY (N/A) FEEDING JEJUNOSTOMY (N/A) CHEST TUBE INSERTION (Left)  Total Length of Stay:  LOS: 2 days   Subjective: Alert  And has been walking  Objective: Vital signs in last 24 hours: Temp:  [97.4 F (36.3 C)-99 F (37.2 C)] 99 F (37.2 C) (10/04 0700) Pulse Rate:  [99-141] 129 (10/04 0847) Cardiac Rhythm: Sinus tachycardia (10/04 0800) Resp:  [5-33] 22 (10/04 0847) BP: (92-143)/(41-75) 143/75 (10/04 0802) SpO2:  [96 %-99 %] 96 % (10/04 0847)  Filed Weights   04/23/16 0614 04/24/16 0400  Weight: 126 lb (57.2 kg) 129 lb 13.6 oz (58.9 kg)    Weight change:    Hemodynamic parameters for last 24 hours:    Intake/Output from previous day: 10/03 0701 - 10/04 0700 In: 2590 [I.V.:2320; NG/GT:90; IV Piggyback:150] Out: 2490 [Urine:2005; Emesis/NG output:275; Chest Tube:210]  Intake/Output this shift: Total I/O In: 100 [I.V.:100] Out: 150 [Urine:150]  Current Meds: Scheduled Meds: . chlorhexidine  15 mL Mouth Rinse BID  . Chlorhexidine Gluconate Cloth  6 each Topical Daily  . enoxaparin (LOVENOX) injection  30 mg Subcutaneous Q24H  . famotidine (PEPCID) IV  20 mg Intravenous Q12H  . insulin aspart  0-24 Units Subcutaneous Q4H  . mouth rinse  15 mL Mouth Rinse BID  . mouth rinse  15 mL Mouth Rinse q12n4p  . metoCLOPramide (REGLAN) injection  10 mg Intravenous Q6H  . metoprolol tartrate  12.5 mg Per J Tube BID  . mupirocin ointment  1 application Nasal BID  . sodium chloride flush  10 mL Intravenous Q12H   Continuous Infusions: . dexmedetomidine    . dextrose 5 % and 0.45 % NaCl with KCl 20 mEq/L 100 mL/hr at 04/25/16  0800   PRN Meds:.albuterol, hydrALAZINE, metoprolol, morphine injection, ondansetron (ZOFRAN) IV, potassium chloride  General appearance: alert and cooperative Neurologic: intact Heart: regular rate and rhythm, S1, S2 normal, no murmur, click, rub or gallop Lungs: diminished breath sounds bibasilar Abdomen: soft, non-tender; bowel sounds normal; no masses,  no organomegaly Extremities: extremities normal, atraumatic, no cyanosis or edema and Homans sign is negative, no sign of DVT Wound: intact, no neck  drainage   Lab Results: CBC: Recent Labs  04/24/16 0342 04/25/16 0326  WBC 12.6* 11.5*  HGB 11.5* 10.9*  HCT 34.6* 32.8*  PLT 183 151   BMET:  Recent Labs  04/24/16 0342 04/25/16 0326  NA 133* 134*  K 4.1 3.8  CL 105 104  CO2 23 25  GLUCOSE 183* 157*  BUN <5* <5*  CREATININE 0.65 0.54*  CALCIUM 8.3* 8.2*    CMET: Lab Results  Component Value Date   WBC 11.5 (H) 04/25/2016   HGB 10.9 (L) 04/25/2016   HCT 32.8 (L) 04/25/2016   PLT 151 04/25/2016   GLUCOSE 157 (H) 04/25/2016   ALT 19 04/20/2016   AST 26 04/20/2016   NA 134 (L) 04/25/2016   K 3.8 04/25/2016   CL 104 04/25/2016   CREATININE 0.54 (L) 04/25/2016   BUN <5 (L) 04/25/2016  CO2 25 04/25/2016   INR 1.06 04/20/2016    PT/INR: No results for input(s): LABPROT, INR in the last 72 hours. Radiology: Dg Chest Port 1 View  Result Date: 04/25/2016 CLINICAL DATA:  Chest tube treatment for left pneumothorax. Status post esophagectomy for malignancy EXAM: PORTABLE CHEST 1 VIEW COMPARISON:  Portable chest x-ray of April 24, 2016 FINDINGS: The lungs are mildly hypo inflated. A small apical pneumothorax on the left is stable the left chest tube tip projects over the posterior aspect of the left fifth rib. There is bibasilar atelectasis greatest on the left. There is no significant pleural effusion. The heart is normal in size. There is calcification in the wall of the aortic arch. The esophagogastric tube is in  stable position with the proximal port overlying the distal third of the reconstructed esophagus with the tip at the expected location of the former GE junction. The tip of the right internal jugular venous catheter projects over the midportion of the SVC. IMPRESSION: 1. Stable less than 5% left apical pneumothorax. Stable positioning of the left chest tube. Persistent left lower lobe atelectasis with minimal atelectasis on the right. No significant pleural effusion. 2. Mild cardiomegaly without pulmonary vascular congestion. Aortic atherosclerosis. 3. The esophagogastric tube is in stable position as described. Electronically Signed   By: David  Martinique M.D.   On: 04/25/2016 07:29     Assessment/Plan: S/P Procedure(s) (LRB): VIDEO BRONCHOSCOPY (N/A) TRANSHIATIAL TOTAL ESOPHAGECTOMY COMPLETE; CERVICAL ESOPHAGOGASTROSTOMY AND PYLOROPLASTY (N/A) FEEDING JEJUNOSTOMY (N/A) CHEST TUBE INSERTION (Left) Mobilize Diuresis Diabetes control start tube feeding      [name] 04/25/2016 9:12 AM

## 2016-04-26 ENCOUNTER — Inpatient Hospital Stay (HOSPITAL_COMMUNITY): Payer: Non-veteran care

## 2016-04-26 LAB — CBC
HCT: 32.4 % — ABNORMAL LOW (ref 39.0–52.0)
Hemoglobin: 10.9 g/dL — ABNORMAL LOW (ref 13.0–17.0)
MCH: 31.3 pg (ref 26.0–34.0)
MCHC: 33.6 g/dL (ref 30.0–36.0)
MCV: 93.1 fL (ref 78.0–100.0)
Platelets: 184 10*3/uL (ref 150–400)
RBC: 3.48 MIL/uL — ABNORMAL LOW (ref 4.22–5.81)
RDW: 13.1 % (ref 11.5–15.5)
WBC: 10.1 10*3/uL (ref 4.0–10.5)

## 2016-04-26 LAB — BASIC METABOLIC PANEL
Anion gap: 6 (ref 5–15)
BUN: <5 mg/dL — ABNORMAL LOW (ref 6–20)
CO2: 25 mmol/L (ref 22–32)
Calcium: 8.3 mg/dL — ABNORMAL LOW (ref 8.9–10.3)
Chloride: 104 mmol/L (ref 101–111)
Creatinine, Ser: 0.5 mg/dL — ABNORMAL LOW (ref 0.61–1.24)
GFR calc Af Amer: >60 mL/min (ref >60)
GFR calc non Af Amer: >60 mL/min (ref >60)
Glucose, Bld: 155 mg/dL — ABNORMAL HIGH (ref 65–99)
Potassium: 3.6 mmol/L (ref 3.5–5.1)
Sodium: 135 mmol/L (ref 135–145)

## 2016-04-26 LAB — GLUCOSE, CAPILLARY
Glucose-Capillary: 148 mg/dL — ABNORMAL HIGH (ref 65–99)
Glucose-Capillary: 91 mg/dL (ref 65–99)
Glucose-Capillary: 157 mg/dL — ABNORMAL HIGH (ref 65–99)
Glucose-Capillary: 117 mg/dL — ABNORMAL HIGH (ref 65–99)
Glucose-Capillary: 153 mg/dL — ABNORMAL HIGH (ref 65–99)
Glucose-Capillary: 150 mg/dL — ABNORMAL HIGH (ref 65–99)

## 2016-04-26 MED ORDER — VITAL AF 1.2 CAL PO LIQD: 1000.0000 mL | ORAL | Status: DC

## 2016-04-26 MED ORDER — FLUTICASONE PROPIONATE 50 MCG/ACT NA SUSP
1.0000 | Freq: Every day | NASAL | Status: DC
  Administered 2016-04-26 – 2016-05-04 (×8): 1 via NASAL
  Filled 2016-04-26: qty 16

## 2016-04-26 MED ORDER — OXYCODONE HCL 5 MG/5ML PO SOLN
5.0000 mg | ORAL | Status: DC | PRN
  Administered 2016-04-27 – 2016-04-30 (×7): 5 mg via JEJUNOSTOMY
  Filled 2016-04-26 (×7): qty 5

## 2016-04-26 MED ORDER — VITAL AF 1.2 CAL PO LIQD
1000.0000 mL | ORAL | Status: DC
  Administered 2016-04-26: 1000 mL

## 2016-04-26 MED ORDER — POTASSIUM CHLORIDE 10 MEQ/50ML IV SOLN
10.0000 meq | INTRAVENOUS | Status: AC | PRN
  Administered 2016-04-26 (×3): 10 meq via INTRAVENOUS
  Filled 2016-04-26 (×3): qty 50

## 2016-04-26 MED ORDER — ACETAMINOPHEN 160 MG/5ML PO SOLN
325.0000 mg | ORAL | Status: DC | PRN
  Administered 2016-04-29 (×3): 325 mg
  Filled 2016-04-26 (×3): qty 20.3

## 2016-04-26 MED ORDER — METOPROLOL TARTRATE 5 MG/5ML IV SOLN
INTRAVENOUS | Status: AC
  Administered 2016-04-26: 2.5 mg via INTRAVENOUS
  Filled 2016-04-26: qty 5

## 2016-04-26 MED ORDER — METOPROLOL TARTRATE 5 MG/5ML IV SOLN
2.5000 mg | Freq: Once | INTRAVENOUS | Status: AC
  Administered 2016-04-26: 2.5 mg via INTRAVENOUS

## 2016-04-26 MED ORDER — METOPROLOL TARTRATE 25 MG/10 ML ORAL SUSPENSION
25.0000 mg | Freq: Two times a day (BID) | ORAL | Status: DC
  Administered 2016-04-26 – 2016-04-28 (×4): 25 mg via JEJUNOSTOMY
  Filled 2016-04-26 (×4): qty 10

## 2016-04-26 NOTE — Plan of Care (Signed)
Problem: Nutritional: Goal: Maintenance of adequate weight for body size and type will improve Outcome: Progressing Tube feeding started 10/4

## 2016-04-26 NOTE — Progress Notes (Signed)
Patient ID: Fred [name], male   DOB: 1941/05/23, 75 y.o.   MRN: [id number] TCTS DAILY ICU PROGRESS NOTE                   [address]            [place] [address]          [phone]   3 Days Post-Op Procedure(s) (LRB): VIDEO BRONCHOSCOPY (N/A) TRANSHIATIAL TOTAL ESOPHAGECTOMY COMPLETE; CERVICAL ESOPHAGOGASTROSTOMY AND PYLOROPLASTY (N/A) FEEDING JEJUNOSTOMY (N/A) CHEST TUBE INSERTION (Left)  Total Length of Stay:  LOS: 3 days   Subjective: Walking well,   Objective: Vital signs in last 24 hours: Temp:  [98.5 F (36.9 C)-98.8 F (37.1 C)] 98.6 F (37 C) (10/05 0740) Pulse Rate:  [70-131] 105 (10/05 0700) Cardiac Rhythm: Sinus tachycardia (10/05 0000) Resp:  [18-29] 20 (10/05 0700) BP: (99-149)/(57-85) 121/70 (10/05 0700) SpO2:  [92 %-98 %] 97 % (10/05 0700) Weight:  [126 lb 5.2 oz (57.3 kg)] 126 lb 5.2 oz (57.3 kg) (10/05 0544)  Filed Weights   04/23/16 0614 04/24/16 0400 04/26/16 0544  Weight: 126 lb (57.2 kg) 129 lb 13.6 oz (58.9 kg) 126 lb 5.2 oz (57.3 kg)    Weight change:    Hemodynamic parameters for last 24 hours:    Intake/Output from previous day: 10/04 0701 - 10/05 0700 In: 2447.5 [I.V.:1787.5; NG/GT:430; IV Piggyback:200] Out: 2030 [Urine:1660; Emesis/NG output:250; Chest Tube:120]  Intake/Output this shift: Total I/O In: -  Out: 300 [Urine:300]  Current Meds: Scheduled Meds: . chlorhexidine  15 mL Mouth Rinse BID  . Chlorhexidine Gluconate Cloth  6 each Topical Daily  . enoxaparin (LOVENOX) injection  30 mg Subcutaneous Q24H  . famotidine (PEPCID) IV  20 mg Intravenous Q12H  . feeding supplement (VITAL AF 1.2 CAL)  1,000 mL Per Tube Q24H  . insulin aspart  0-24 Units Subcutaneous Q4H  . mouth rinse  15 mL Mouth Rinse BID  . mouth rinse  15 mL Mouth Rinse q12n4p  . metoCLOPramide (REGLAN) injection  10 mg Intravenous Q6H  . metoprolol tartrate  12.5 mg Per J Tube BID  . mupirocin ointment  1 application Nasal BID  .  sodium chloride flush  10 mL Intravenous Q12H   Continuous Infusions: . dexmedetomidine    . dextrose 5 % and 0.45 % NaCl with KCl 20 mEq/L 75 mL/hr at 04/26/16 0600   PRN Meds:.hydrALAZINE, metoprolol, morphine injection, ondansetron (ZOFRAN) IV, potassium chloride, potassium chloride  General appearance: alert and cooperative Neurologic: intact Heart: regular rate and rhythm, S1, S2 normal, no murmur, click, rub or gallop Lungs: diminished breath sounds bibasilar Abdomen: soft, non-tender; bowel sounds normal; no masses,  no organomegaly Extremities: extremities normal, atraumatic, no cyanosis or edema and Homans sign is negative, no sign of DVT Wound: dressing intact, no drainage from neck wound  Lab Results: CBC: Recent Labs  04/25/16 0326 04/26/16 0339  WBC 11.5* 10.1  HGB 10.9* 10.9*  HCT 32.8* 32.4*  PLT 151 184   BMET:  Recent Labs  04/25/16 0326 04/26/16 0339  NA 134* 135  K 3.8 3.6  CL 104 104  CO2 25 25  GLUCOSE 157* 155*  BUN <5* <5*  CREATININE 0.54* 0.50*  CALCIUM 8.2* 8.3*    CMET: Lab Results  Component Value Date   WBC 10.1 04/26/2016   HGB 10.9 (L) 04/26/2016   HCT 32.4 (L) 04/26/2016   PLT 184 04/26/2016   GLUCOSE 155 (H) 04/26/2016  ALT 19 04/20/2016   AST 26 04/20/2016   NA 135 04/26/2016   K 3.6 04/26/2016   CL 104 04/26/2016   CREATININE 0.50 (L) 04/26/2016   BUN <5 (L) 04/26/2016   CO2 25 04/26/2016   INR 1.06 04/20/2016    PT/INR: No results for input(s): LABPROT, INR in the last 72 hours. Radiology: Dg Chest Port 1 View  Result Date: 04/26/2016 CLINICAL DATA:  Chest pain EXAM: PORTABLE CHEST 1 VIEW COMPARISON:  04/25/2016 FINDINGS: Nasogastric catheter is noted within the distal esophagus stable from the prior exam. Right jugular central line is again noted and stable. Left-sided thoracostomy catheter is seen. The previously noted pneumothorax is less well appreciated. Bibasilar atelectatic changes and small pleural effusions  are again seen. No new focal abnormality is noted. IMPRESSION: Resolution of previously seen apical pneumothorax. Persistent bibasilar atelectasis and pleural effusions. Electronically Signed   By: Inez Catalina M.D.   On: 04/26/2016 07:51     Assessment/Plan: S/P Procedure(s) (LRB): VIDEO BRONCHOSCOPY (N/A) TRANSHIATIAL TOTAL ESOPHAGECTOMY COMPLETE; CERVICAL ESOPHAGOGASTROSTOMY AND PYLOROPLASTY (N/A) FEEDING JEJUNOSTOMY (N/A) CHEST TUBE INSERTION (Left) Mobilize Diabetes control d/c tubes/lines increae tube feeding      [name] 04/26/2016 8:11 AM

## 2016-04-26 NOTE — Progress Notes (Signed)
      [address]       [place] [address]             [phone]      No complaints  BP 120/72   Pulse (!) 113   Temp 98.3 F (36.8 C) (Oral)   Resp (!) 31   Ht 5\' 5"  (1.651 m)   Wt 126 lb 5.2 oz (57.3 kg)   SpO2 98%   BMI 21.02 kg/m    Intake/Output Summary (Last 24 hours) at 04/26/16 1929 Last data filed at 04/26/16 1800  Gross per 24 hour  Intake           2312.5 ml  Output             2575 ml  Net           -262.5 ml    Sinus tachycardia persists, lopressor increased  Remo Lipps C. Roxan Hockey, MD [facility] [phone]

## 2016-04-27 ENCOUNTER — Inpatient Hospital Stay (HOSPITAL_COMMUNITY): Payer: Non-veteran care

## 2016-04-27 LAB — CBC
HCT: 33.2 % — ABNORMAL LOW (ref 39.0–52.0)
Hemoglobin: 10.9 g/dL — ABNORMAL LOW (ref 13.0–17.0)
MCH: 30.8 pg (ref 26.0–34.0)
MCHC: 32.8 g/dL (ref 30.0–36.0)
MCV: 93.8 fL (ref 78.0–100.0)
Platelets: 153 10*3/uL (ref 150–400)
RBC: 3.54 MIL/uL — ABNORMAL LOW (ref 4.22–5.81)
RDW: 13 % (ref 11.5–15.5)
WBC: 5.9 10*3/uL (ref 4.0–10.5)

## 2016-04-27 LAB — GLUCOSE, CAPILLARY
Glucose-Capillary: 153 mg/dL — ABNORMAL HIGH (ref 65–99)
Glucose-Capillary: 142 mg/dL — ABNORMAL HIGH (ref 65–99)
Glucose-Capillary: 143 mg/dL — ABNORMAL HIGH (ref 65–99)
Glucose-Capillary: 128 mg/dL — ABNORMAL HIGH (ref 65–99)

## 2016-04-27 LAB — BASIC METABOLIC PANEL
Anion gap: 8 (ref 5–15)
BUN: 6 mg/dL (ref 6–20)
CO2: 25 mmol/L (ref 22–32)
Calcium: 8.6 mg/dL — ABNORMAL LOW (ref 8.9–10.3)
Chloride: 105 mmol/L (ref 101–111)
Creatinine, Ser: 0.45 mg/dL — ABNORMAL LOW (ref 0.61–1.24)
GFR calc Af Amer: >60 mL/min (ref >60)
GFR calc non Af Amer: >60 mL/min (ref >60)
Glucose, Bld: 138 mg/dL — ABNORMAL HIGH (ref 65–99)
Potassium: 3.6 mmol/L (ref 3.5–5.1)
Sodium: 138 mmol/L (ref 135–145)

## 2016-04-27 MED ORDER — METOCLOPRAMIDE HCL 5 MG/ML IJ SOLN
10.0000 mg | Freq: Three times a day (TID) | INTRAMUSCULAR | Status: DC
  Administered 2016-04-27 – 2016-05-03 (×17): 10 mg via INTRAVENOUS
  Filled 2016-04-27 (×17): qty 2

## 2016-04-27 MED ORDER — KCL IN DEXTROSE-NACL 20-5-0.45 MEQ/L-%-% IV SOLN
INTRAVENOUS | Status: DC
  Administered 2016-04-30 – 2016-05-01 (×2): via INTRAVENOUS
  Filled 2016-04-27 (×3): qty 1000

## 2016-04-27 MED ORDER — POTASSIUM CHLORIDE 10 MEQ/50ML IV SOLN
10.0000 meq | INTRAVENOUS | Status: AC
  Administered 2016-04-27 (×3): 10 meq via INTRAVENOUS
  Filled 2016-04-27 (×2): qty 50

## 2016-04-27 MED ORDER — VITAL AF 1.2 CAL PO LIQD
1000.0000 mL | ORAL | Status: DC
  Administered 2016-04-27: 1000 mL
  Filled 2016-04-27 (×2): qty 1000

## 2016-04-27 NOTE — Care Management Note (Signed)
Case Management Note  Patient Details  Name: MINAS BONSER MRN: [id number] Date of Birth: August 29, 1940  Subjective/Objective:    Met with pt, wife, and daughter @ bedside.  Wife will be available to provide 24/7 assistance when pt is medically ready for discharge.  Daughter indicates she will also be available for support when she is not working.  Pt feels he is doing well ambulating with staff.                               Expected Discharge Plan:  [place]  Discharge planning Services  CM Consult  Status of Service:  In process, will continue to follow  Girard Cooter, RN 04/27/2016, 12:48 PM

## 2016-04-27 NOTE — Discharge Summary (Signed)
Physician Discharge Summary  Patient ID: Fred [name] MRN: [id number] DOB/AGE: 1941/07/10 75 y.o.  Admit date: 04/23/2016 Discharge date: 05/04/2016  Admission Diagnoses: Patient Active Problem List   Diagnosis Date Noted  . Sinus tachycardia 05/02/2016  . Malnutrition of moderate degree 04/25/2016  . Esophageal cancer ([place]) 04/23/2016  . Malnutrition ([place]) 02/08/2016  . HTN (hypertension) 01/23/2016  . Malignant neoplasm of lower third of esophagus ([place]) 11/24/2015     Discharge Diagnoses:  Active Problems:   HTN (hypertension)   Esophageal cancer (H[facility])   Malnutrition of moderate degree   Sinus tachycardia   Discharged Condition: good  HPI: Fred [name] 75 y.o. male was originally seen in the office at his request for a second surgical opinion concerning his  diagnosed adenocarcinoma the distal esophagus. The patient gives a history of many years of reflux symptoms taking acid blocking medications. For  3 months he had noted increasing difficulty swallowing specially solid food and bread. He was referred by the [facility] to [place] were upper GI endoscopy was performed a near obstructing mass 6 cm lower third of the esophagus was noted. 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 The  patient underwent esophageal ultrasound at [facility] an ulcerated mass was noted from 27-35 cm in the setting of Barrett's esophagus mass to involve the GE junction but not the cardia. By EUS criteria the lesion was T3 with breaks into the muscularis propria total thickness 9 mm, there was present regional lymph nodes, staged N1. . The patient Com[facility].  The patient completed radiation therapy July 13. He became catabolic losing weight and ultimately required placement of a panda feeding tube.  He comes to the office today feeling much better  to the point he has been out mowing his lawn. His by mouth intake over the past 2 weeks has significantly increased to the point on his last visit I told him if his feeding tube clogged to just remove it, which he has done and continues on a by mouth diet .  There is a family history of gastric cancer in his maternal grandfather, patient's father died of a stroke in his 64s mother died in her 25s of Alzheimer.   Patient notes that when his symptoms first began he had chest discomfort and was referred to the [facility] ER from the [facility], he notes a stress test and echocardiogram were done at the [place] per his report he was told he had no cardiac issues- his wife will assist in obtaining these records. Patient saw Dr. Percival Spanish  last week for preop cardiac clearance, no further evaluation was thought to be needed by him.  Because of questionable abnormalities in his liver and left adrenal and MRI of the liver and adrenals was done this past week. The patient comes in today for reevaluation of his nutritional status and to plan surgical resection.  He was seen by Cardioliogy preop    Hospital Course:  Mr. Brine underwent a Bronchoscopy, pyloroplasty feeding jejunostomy, and complete esophagectomy with Dr. Servando Snare on 04/23/2016. He did well and was transferred to SICU. POD 1 we started tube feedings, mobilized, and started a diuretic regimen for fluid overload. We work on diabetes control. We consulted nutrition to assist with care. POD chest tubes and line were discontinued.  On 10/6 his post chest tube removal xray showed a 10% pneumothorax on the left.  At this time, we ordered serial chest xrays to monitor the status. He was transferred to the telemetry unit for continued care.  This was titrated accordingly. He tolerated tube feedings. He had a swallow study on 04/30/2016. Results showed post operative changes but NO leak. He was started on clear liquids. His diet was advanced to a soft diet on  05/03/2016. He did have some neck drainage and a low grade fever to 99, but no cellulitis. This did improve. The drain in his neck was slowly advanced. He was on Atenolol pre op but was changed to Lopressor for better heart rate control. Despite titration, he still was tachycardic. Cardiology was consulted. It was felt likely secondary to chronic disease (including malnutrition). TSH was 2.422, T4 slightly elevated, and BNP was 13.6. No evidence of underlying heart failure. Patient is currently on 25 mg tid via J tube. Tachycardia is improved.  He will only require night time tube feedings.  Consults: Nutrition  Significant Diagnostic Studies:  CLINICAL DATA:  Status post esophagectomy with jejunal interposition  EXAM: PORTABLE CHEST 1 VIEW  COMPARISON:  Portable chest x-ray of April 26, 2016  FINDINGS: There has been interval removal of the left chest tube with development of an approximately 15-20% left-sided pneumothorax. There is persistent left basilar atelectasis. The interstitial markings on the right remain increased and are slightly more prominent today. The cardiac silhouette remains enlarged. The pulmonary vascularity is mildly prominent and indistinct. There is no significant pleural effusion. The right internal jugular venous catheter tip projects over the midportion of the SVC. The esophagogastric tube has been removed.  IMPRESSION: Interval development of a 15-20% left-sided pneumothorax since removal of the left chest tube. Persistent left lower lobe atelectasis. Slight increased prominence of the pulmonary interstitium on the right. Stable cardiomegaly.  These results were called by telephone at the time of interpretation on 04/27/2016 at 7:41 am to [facility], RN, , who verbally acknowledged these results. No active disease.   Electronically Signed   By: David  Martinique M.D.   On: 04/27/2016 07:45  Treatments:  DATE OF PROCEDURE:  04/23/2016 DATE  OF DISCHARGE:                              OPERATIVE REPORT   PREOPERATIVE DIAGNOSIS:  Adenocarcinoma of the distal esophagus previously treated with radiation and chemotherapy, clinically staged at 3A.  POSTOPERATIVE DIAGNOSIS:  Adenocarcinoma of the distal esophagus previously treated with radiation and chemotherapy, clinically staged at 3A.  SURGICAL PROCEDURE:  Bronchoscopy, transhiatal total esophagectomy with cervical esophagogastrostomy, pyloroplasty, feeding jejunostomy and left chest tube.  SURGEON:  Lanelle Bal, M.D.  FIRST ASSISTANT:  Wyvonnia Lora, PA.   Discharge Exam: Blood pressure 105/74, pulse (!) 120, temperature 97.9 F (36.6 C), temperature source Oral, resp. rate 18, height _0  (1.651 m), weight 121 lb 6.4 oz (55.1 kg), SpO2 99 %.  General appearance: alert, cooperative and no distress Heart: regular rate and rhythm Lungs: mildly dim left base Abdomen: soft, mild incis ttp Extremities: no edema Wound: incis healing well Neck dressing CDI    Disposition: 01-Home or Self Care     Medication List    STOP taking these medications   oxyCODONE 5 MG immediate release tablet Commonly known as:  Oxy IR/ROXICODONE Replaced by:  oxyCODONE 5 MG/5ML solution     TAKE these medications   acetaminophen 160 MG/5ML solution Commonly known as:  TYLENOL Place 10.2 mLs (325  mg total) into feeding tube every 4 (four) hours as needed for moderate pain.   atenolol 25 MG tablet Commonly known as:  TENORMIN Take 1 tablet (25 mg total) by mouth 2 (two) times daily. What changed:  how much to take  when to take this   feeding supplement (OSMOLITE 1.5 CAL) Liqd Increase Osmolite 1.5 to 70 mL/hr continuously from 6 pm to 6 am  hours via Jejunostomy with 50 mL free water flush every hour. Flush with 120 mL water once daily when changing bag. What changed:  additional instructions   fluticasone 50 MCG/ACT nasal spray Commonly known as:   FLONASE Place 1 spray into both nostrils daily as needed for allergies or rhinitis.   oxyCODONE 5 MG/5ML solution Commonly known as:  ROXICODONE 5 mLs (5 mg total) by Per J Tube route every 6 (six) hours as needed for severe pain. Replaces:  oxyCODONE 5 MG immediate release tablet       Note- Patient has a large supply of atenolol at home. D/W Dr Servando Snare - d/c on 25 bid    Follow-up Information    [name], MD .   Specialty:  Cardiothoracic Surgery Why:  Your appointment is on 05/23/2016 at 4:00. Please arrive at 3:30pm for a chest xray. This will be done at [place] [facility] located on the first floor of our building.  Contact information: [address] [place] [address] [phone]        [name], MD. Call today.   Specialty:  Internal Medicine Contact information: [place] [address] [phone]        Minus Breeding, MD .   Specialty:  Cardiology Why:  Call for a follow up appointment for 2 weeks Contact information: [place] [address] [phone]           Signed: John Giovanni PA-C 05/04/2016, 1:06 PM

## 2016-04-27 NOTE — Progress Notes (Signed)
Pt arrived to unit from 2S.  Telemetry applied and CCMD notified x 2.  Pt and family oriented to room including call light and telephone.  Pt plan of care discussed with family and family and Pt indicates understanding.  Will cont to monitor.

## 2016-04-27 NOTE — Op Note (Addendum)
NAME:  Fred [name], Fred [name] NO.:  [id number]  MEDICAL RECORD NO.:  [id number]  LOCATION:  2S04C                        FACILITY:  [facility]  PHYSICIAN:  Lanelle Bal, MD    DATE OF BIRTH:  [birth year]  DATE OF PROCEDURE:  04/23/2016 OPERATIVE REPORT  PREOPERATIVE DIAGNOSIS:  Adenocarcinoma of the distal esophagus previously treated with radiation and chemotherapy, clinically staged at IIIA  POSTOPERATIVE DIAGNOSIS:  Adenocarcinoma of the distal esophagus previously treated with radiation and chemotherapy, clinically staged at IIIA  SURGICAL PROCEDURE:  Bronchoscopy, transhiatal total esophagectomy with cervical esophagogastrostomy, pyloroplasty, feeding jejunostomy and left chest tube.  SURGEON:  Lanelle Bal, M.D.  FIRST ASSISTANT: Thurnell Lose, PA.  BRIEF HISTORY:  The patient is a 75 year old male who originally presented in the spring of 2017 with increasing difficulty swallowing. He was initially seen at [facility] where endoscopy and biopsies were done and an esophageal ultrasound was done staging the patient's adenocarcinoma of the distal esophagus as a T3, N1 clinical staging.  CT scan and PET scan were performed confirming the lack of distal metastasis prior surgery.  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  The patient had some difficulty with maintaining his weight and required a feeding tube for a period of time for tube feedings but with intensive and frequent visits, his nutritional status changed and significantly improved to the point where we recommended proceeding with esophagectomy.  Risks and options were discussed with the patient and his family in detail and he agreed and signed informed consent.  DESCRIPTION OF PROCEDURE:  The patient was brought to operating room 10. He underwent general  endotracheal anesthesia with a NIMMS endotracheal tube.  After appropriate time-out was performed, a fiberoptic scope was plastered to the subsegmental level with good position of the endotracheal tube and no tracheal or bronchial lesions.  The patient was then prepped left neck, chest and abdomen.  A second time-out was performed and then we proceeded with upper midline abdominal incision and explored the abdomen.  There was no palpable celiac nodes appreciated or liver lesions.  We then proceeded with our planned esophagectomy.  The stomach was completely intact without palpable lesions.  We first proceeded with the ligature electrocardiac device dividing the short gastric vessels up to the hiatus taking care not to injure the spleen.  The hiatus was then dissected free and the esophagus at the hiatal level was encircled with a Penrose drain.  The lesser sac was then entered and the cardinal vein and left gastric artery were identified.  A small bulldog was placed on the artery and confirming the patient had easily palpable pulse along the gastroepiploic vessels.  With the stomach mobilized we then proceeded with abdominal portion of transhiatal esophagectomy opening hiatus enlarging it in proceeding with dividing branches along the esophagus with a harmonic scalpel. With the esophagus freed as much as possible from the abdomen we then moved to the left neck. A left neck incision along the sternocleidomastoid muscle in the lower neck was done dividing through the platysma. The sternocleidomastoid muscle and jugular vein and carotid artery were retracted laterally and the dissection was carried down to the  pre-vertebral fascia. Using the Nims stimulator the recurrent nerve was definitively identified and preserved throughout the procedure. Care was taken not to retract on it anymore than necessary. The cervical esophagus was encircled with a Penrose drain we then proceeded with further transhiatal  dissection of the esophagus from above at the neck incision and below. With the esophagus freed the NG tube was pulled back and stapled with a GIA stapler at the neck. The specimen was then delivered to the abdomen. The left gastric artery and cardinal vein were divided. Using serial firing a 55 linear stapler a gastric tube based on the right gastroepiploic artery was created. A standard pyloroplasty was performed, closing with interrupted 4-0 Vicryl sutures and 3-0 silk sutures. The fundus of the stomach was marked with a marking pen taking care not to have the anastomosis at the very tip of the stomach. Using a camera bag this stomach was delivered through the posterior mediastinum into the neck incision. The cervical esophagus and fundus of the stomach were tacked together. Previous staple line on the cervical esophagus was divided the stomach was opened and the posterior wall of the anastomosis was created with a Endo GIA stapler. The NG tube was passed through the anastomosis and positioned in the gastric tube, the anterior wall the anastomosis went was then completed with Vicryl sutures in a second layer of silk sutures. Frozen section had been done on the proximal distal margins- and were reported as clear. Penrose drain was left in the depths of the neck incision brought out through a separate stab wound in the neck. At the completion of the neck portion of the procedure the Nims stimulator confirmed intact recurrent nerve. The neck incision was closed with interrupted 3-0 Vicryl sutures and a 40 subcutaneous stitch.  We then turned attention to tacking the gastric tube to the esophageal hiatus to prevent herniation into the chest. A standard witzeled red rubber catheter was placed into the proximal jejunum and brought out through separate site on the left abdominal wall as a feeding jejunostomy tube. A left chest tube was left in place. With operative field hemostatic the abdominal incision was closed  with running 0 Prolene sutures, 3-0 subcuticular stitch and skin staples. Dry dressings were applied sponge and needle count was reported as correct completion of the procedure, patient tolerated procedure without obvious complication. Estimated blood loss 250-300 mL. Patient was transferred intubated in stable condition to the surgical intensive care unit.   Lanelle Bal, MD     EG/MEDQ  D:  04/27/2016  T:  04/27/2016  Job:  [id number]

## 2016-04-27 NOTE — Discharge Instructions (Signed)
Esophageal Cancer  The esophagus is the tube that carries food and liquid from your throat to your stomach. Esophageal cancer is an abnormal growth of cells in the esophagus that is cancerous (malignant). CAUSES The exact cause of esophageal cancer is not known.  RISK FACTORS Risk factors for esophageal cancer include:  Being older than 72.  Being male.  Using any tobacco product, including cigarettes, chewing tobacco, and electronic cigarettes.  Drinking excessive amounts of alcohol.  Eating a diet that is lacking in fruits and vegetables.  Being overweight (obese).  Having damage to your esophagus due to toxin or poison exposure.  Having conditions that cause damage or irritation to the esophagus. These conditions include:  Acid reflux.  Barrett esophagus.  Achalasia. SIGNS AND SYMPTOMS Symptoms may include:  Trouble swallowing.  Chest or back pain.  Unintentional weight loss.  Fatigue.  Hoarse voice.  Coughing. This may include coughing up blood.  Vomiting. This may include vomiting up blood. DIAGNOSIS Your health care provider may ask about your medical history and perform a physical exam. Other tests that may be done include:  Barium swallow.  Endoscopic exam.  X-rays.  CT scan.  MRI.  Esophagography.  A tissue sample test (biopsy). If esophageal cancer is confirmed, it will be staged to determine its severity and extent. Staging is an assessment of:  The size of the tumor.  Whether or not the cancer has spread.  Where the cancer has spread. TREATMENT Treatment for esophageal cancer depends on the type and stage of the cancer. Treatment may include one or more of the following:  Surgery to remove as much of the cancer as possible.  Chemotherapy. This treatment uses medicines to kill the cancer cells.  Radiation therapy to kill cancer cells.  Targeted drug therapy. These medicines block the growth and spread of cancer. This treatment is  different from standard chemotherapy.  Immunotherapy. This is also called biological therapy. It is an emerging treatment that strengthens your body's defense (immune) system to fight the cancer cells. HOME CARE INSTRUCTIONS  Take medicines only as directed by your health care provider.  Maintain a healthy diet.  Consider joining a support group.  Seek advice to help you manage side effects of treatment.  Keep all follow-up visits as directed by your health care provider. This is important.  Do not use any tobacco products, including cigarettes, chewing tobacco, or electronic cigarettes. If you need help quitting, ask your health care provider.  Talk with your health care provider about limiting or avoiding alcohol. SEEK MEDICAL CARE IF:  You are having a harder time swallowing or eating.  You notice new fatigue or weakness.  You lose weight unintentionally.  You have a fever. SEEK IMMEDIATE MEDICAL CARE IF:  You have a sudden increase in pain.  You have trouble breathing.  You vomit blood or black material that looks like coffee grounds.  You have black stools.  You faint.   This information is not intended to replace advice given to you by your health care provider. Make sure you discuss any questions you have with your health care provider.   Document Released: 06/21/2008 Document Revised: 07/30/2014 Document Reviewed: 02/09/2014 Elsevier Interactive Patient Education [employer].

## 2016-04-27 NOTE — Progress Notes (Addendum)
Patient ID: Fred [name], male   DOB: [birth year], 75 y.o.   MRN: [id number] TCTS DAILY ICU PROGRESS NOTE                   [address]            [place] [address]          [phone]   4 Days Post-Op Procedure(s) (LRB): VIDEO BRONCHOSCOPY (N/A) TRANSHIATIAL TOTAL ESOPHAGECTOMY COMPLETE; CERVICAL ESOPHAGOGASTROSTOMY AND PYLOROPLASTY (N/A) FEEDING JEJUNOSTOMY (N/A) CHEST TUBE INSERTION (Left)  Total Length of Stay:  LOS: 4 days   Subjective: Alert , walked around unit this am  Objective: Vital signs in last 24 hours: Temp:  [98.3 F (36.8 C)-98.6 F (37 C)] 98.5 F (36.9 C) (10/05 2000) Pulse Rate:  [99-118] 117 (10/06 0600) Cardiac Rhythm: Sinus tachycardia (10/06 0000) Resp:  [19-31] 26 (10/06 0600) BP: (111-141)/(62-82) 132/79 (10/06 0600) SpO2:  [93 %-98 %] 96 % (10/06 0600) Weight:  [123 lb 14.4 oz (56.2 kg)] 123 lb 14.4 oz (56.2 kg) (10/06 0500)  Filed Weights   04/24/16 0400 04/26/16 0544 04/27/16 0500  Weight: 129 lb 13.6 oz (58.9 kg) 126 lb 5.2 oz (57.3 kg) 123 lb 14.4 oz (56.2 kg)    Weight change: -2 lb 6.8 oz (-1.099 kg)   Hemodynamic parameters for last 24 hours:    Intake/Output from previous day: 10/05 0701 - 10/06 0700 In: 2150.7 [I.V.:1272.5; NG/GT:798.2] Out: 2375 [Urine:2375]  Intake/Output this shift: No intake/output data recorded.  Current Meds: Scheduled Meds: . chlorhexidine  15 mL Mouth Rinse BID  . Chlorhexidine Gluconate Cloth  6 each Topical Daily  . enoxaparin (LOVENOX) injection  30 mg Subcutaneous Q24H  . famotidine (PEPCID) IV  20 mg Intravenous Q12H  . feeding supplement (VITAL AF 1.2 CAL)  1,000 mL Per Tube Q24H  . fluticasone  1 spray Each Nare Daily  . insulin aspart  0-24 Units Subcutaneous Q4H  . mouth rinse  15 mL Mouth Rinse q12n4p  . metoCLOPramide (REGLAN) injection  10 mg Intravenous Q6H  . metoprolol tartrate  25 mg Per J Tube BID  . mupirocin ointment  1 application Nasal BID  . sodium  chloride flush  10 mL Intravenous Q12H   Continuous Infusions: . dextrose 5 % and 0.45 % NaCl with KCl 20 mEq/L 50 mL/hr at 04/26/16 1900   PRN Meds:.oxyCODONE **AND** acetaminophen, morphine injection, ondansetron (ZOFRAN) IV, potassium chloride  General appearance: alert and cooperative Neurologic: intact Heart: regular rate and rhythm, S1, S2 normal, no murmur, click, rub or gallop Lungs: clear to auscultation bilaterally Abdomen: soft, non-tender; bowel sounds normal; no masses,  no organomegaly and bowels active, no bm yet Extremities: extremities normal, atraumatic, no cyanosis or edema and Homans sign is negative, no sign of DVT Wound: intact, no neck drainage  Lab Results: CBC: Recent Labs  04/26/16 0339 04/27/16 0440  WBC 10.1 5.9  HGB 10.9* 10.9*  HCT 32.4* 33.2*  PLT 184 153   BMET:  Recent Labs  04/26/16 0339 04/27/16 0440  NA 135 138  K 3.6 3.6  CL 104 105  CO2 25 25  GLUCOSE 155* 138*  BUN <5* 6  CREATININE 0.50* 0.45*  CALCIUM 8.3* 8.6*    CMET: Lab Results  Component Value Date   WBC 5.9 04/27/2016   HGB 10.9 (L) 04/27/2016   HCT 33.2 (L) 04/27/2016   PLT 153 04/27/2016   GLUCOSE 138 (H) 04/27/2016   ALT 19 04/20/2016  AST 26 04/20/2016   NA 138 04/27/2016   K 3.6 04/27/2016   CL 105 04/27/2016   CREATININE 0.45 (L) 04/27/2016   BUN 6 04/27/2016   CO2 25 04/27/2016   INR 1.06 04/20/2016    PT/INR: No results for input(s): LABPROT, INR in the last 72 hours. Radiology: No results found.   Assessment/Plan: S/P Procedure(s) (LRB): VIDEO BRONCHOSCOPY (N/A) TRANSHIATIAL TOTAL ESOPHAGECTOMY COMPLETE; CERVICAL ESOPHAGOGASTROSTOMY AND PYLOROPLASTY (N/A) FEEDING JEJUNOSTOMY (N/A) CHEST TUBE INSERTION (Left) Mobilize Plan for transfer to step-down: see transfer orders Tolerating tube feeding 10% apical ptx on left after chest tube ou, follow up chest xray in am   [name] 04/27/2016 7:27 AM

## 2016-04-27 NOTE — Progress Notes (Signed)
Pt report given to [facility]. Spoke with Bing Neighbors. All questions addressed. Patient transferred. Rn at bedside to receive patient.

## 2016-04-28 ENCOUNTER — Inpatient Hospital Stay (HOSPITAL_COMMUNITY): Payer: Non-veteran care

## 2016-04-28 ENCOUNTER — Encounter: Payer: Self-pay | Admitting: *Deleted

## 2016-04-28 LAB — COMPREHENSIVE METABOLIC PANEL
ALT: 18 U/L (ref 17–63)
AST: 20 U/L (ref 15–41)
Albumin: 2.6 g/dL — ABNORMAL LOW (ref 3.5–5.0)
Alkaline Phosphatase: 72 U/L (ref 38–126)
Anion gap: 6 (ref 5–15)
BUN: 11 mg/dL (ref 6–20)
CO2: 24 mmol/L (ref 22–32)
Calcium: 8.7 mg/dL — ABNORMAL LOW (ref 8.9–10.3)
Chloride: 108 mmol/L (ref 101–111)
Creatinine, Ser: 0.46 mg/dL — ABNORMAL LOW (ref 0.61–1.24)
GFR calc Af Amer: >60 mL/min (ref >60)
GFR calc non Af Amer: >60 mL/min (ref >60)
Glucose, Bld: 137 mg/dL — ABNORMAL HIGH (ref 65–99)
Potassium: 3.9 mmol/L (ref 3.5–5.1)
Sodium: 138 mmol/L (ref 135–145)
Total Bilirubin: 0.6 mg/dL (ref 0.3–1.2)
Total Protein: 5.3 g/dL — ABNORMAL LOW (ref 6.5–8.1)

## 2016-04-28 LAB — CBC
HCT: 33.7 % — ABNORMAL LOW (ref 39.0–52.0)
Hemoglobin: 11.1 g/dL — ABNORMAL LOW (ref 13.0–17.0)
MCH: 30.8 pg (ref 26.0–34.0)
MCHC: 32.9 g/dL (ref 30.0–36.0)
MCV: 93.6 fL (ref 78.0–100.0)
Platelets: 195 10*3/uL (ref 150–400)
RBC: 3.6 MIL/uL — ABNORMAL LOW (ref 4.22–5.81)
RDW: 13 % (ref 11.5–15.5)
WBC: 5.1 10*3/uL (ref 4.0–10.5)

## 2016-04-28 MED ORDER — SODIUM CHLORIDE 0.9% FLUSH
10.0000 mL | Freq: Two times a day (BID) | INTRAVENOUS | Status: DC
  Administered 2016-04-29: 10 mL

## 2016-04-28 MED ORDER — VITAL AF 1.2 CAL PO LIQD
1000.0000 mL | ORAL | Status: DC
  Filled 2016-04-28 (×4): qty 1000

## 2016-04-28 MED ORDER — SODIUM CHLORIDE 0.9% FLUSH
10.0000 mL | INTRAVENOUS | Status: DC | PRN
  Administered 2016-04-29 – 2016-05-01 (×3): 10 mL
  Filled 2016-04-28 (×3): qty 40

## 2016-04-28 MED ORDER — METOPROLOL TARTRATE 25 MG/10 ML ORAL SUSPENSION
37.5000 mg | Freq: Two times a day (BID) | ORAL | Status: DC
  Administered 2016-04-28 – 2016-04-30 (×5): 37.5 mg via JEJUNOSTOMY
  Filled 2016-04-28 (×5): qty 20

## 2016-04-28 NOTE — Anesthesia Postprocedure Evaluation (Signed)
Anesthesia Post Note  Patient: [name]  Procedure(s) Performed: Procedure(s) (LRB): VIDEO BRONCHOSCOPY (N/A) TRANSHIATIAL TOTAL ESOPHAGECTOMY COMPLETE; CERVICAL ESOPHAGOGASTROSTOMY AND PYLOROPLASTY (N/A) FEEDING JEJUNOSTOMY (N/A) CHEST TUBE INSERTION (Left)  Patient location during evaluation: ICU Anesthesia Type: General Level of consciousness: awake Pain management: pain level controlled Vital Signs Assessment: post-procedure vital signs reviewed and stable Respiratory status: spontaneous breathing Cardiovascular status: stable Postop Assessment: no signs of nausea or vomiting Anesthetic complications: no    Last Vitals:  Vitals:   04/28/16 0535 04/28/16 1320  BP: 140/68 120/68  Pulse: (!) 121 (!) 102  Resp: (!) 26 17  Temp: 36.8 C 36.8 C    Last Pain:  Vitals:   04/28/16 1320  TempSrc: Oral  PainSc:                  [name]

## 2016-04-28 NOTE — Progress Notes (Signed)
QI encounter 

## 2016-04-28 NOTE — Progress Notes (Signed)
      [address]       [place] [address]             [phone]      5 Days Post-Op Procedure(s) (LRB): VIDEO BRONCHOSCOPY (N/A) TRANSHIATIAL TOTAL ESOPHAGECTOMY COMPLETE; CERVICAL ESOPHAGOGASTROSTOMY AND PYLOROPLASTY (N/A) FEEDING JEJUNOSTOMY (N/A) CHEST TUBE INSERTION (Left) Subjective: Feels good this morning. Has not had a Bm.   Objective: Vital signs in last 24 hours: Temp:  [97.4 F (36.3 C)-98.3 F (36.8 C)] 98.3 F (36.8 C) (10/07 0535) Pulse Rate:  [93-121] 121 (10/07 0535) Cardiac Rhythm: Sinus tachycardia (10/07 0753) Resp:  [18-26] 26 (10/07 0535) BP: (122-140)/(67-77) 140/68 (10/07 0535) SpO2:  [95 %-97 %] 95 % (10/07 0535) Weight:  [122 lb 12.8 oz (55.7 kg)] 122 lb 12.8 oz (55.7 kg) (10/07 0535)      Intake/Output from previous day: 10/06 0701 - 10/07 0700 In: 775 [I.V.:125; NG/GT:440; IV Piggyback:150] Out: 1295 [Urine:1295] Intake/Output this shift: No intake/output data recorded.  General appearance: alert, cooperative and no distress Heart: regular rate and rhythm, S1, S2 normal, no murmur, click, rub or gallop Lungs: clear to auscultation bilaterally Abdomen: soft, non-tender; bowel sounds normal; no masses,  no organomegaly Extremities: extremities normal, atraumatic, no cyanosis or edema Wound: c/d/i, sutures in place.   Lab Results:  Recent Labs  04/27/16 0440 04/28/16 0308  WBC 5.9 5.1  HGB 10.9* 11.1*  HCT 33.2* 33.7*  PLT 153 195   BMET:  Recent Labs  04/27/16 0440 04/28/16 0308  NA 138 138  K 3.6 3.9  CL 105 108  CO2 25 24  GLUCOSE 138* 137*  BUN 6 11  CREATININE 0.45* 0.46*  CALCIUM 8.6* 8.7*    PT/INR: No results for input(s): LABPROT, INR in the last 72 hours. ABG    Component Value Date/Time   PHART 7.387 04/24/2016 0334   HCO3 22.4 04/24/2016 0334   TCO2 24 04/24/2016 0334   ACIDBASEDEF 2.0 04/24/2016 0334   O2SAT 97.0 04/24/2016 0334   CBG (last 3)   Recent Labs  04/27/16 0850  04/27/16 1155 04/27/16 1635  GLUCAP 142* 143* 128*    Assessment/Plan: S/P Procedure(s) (LRB): VIDEO BRONCHOSCOPY (N/A) TRANSHIATIAL TOTAL ESOPHAGECTOMY COMPLETE; CERVICAL ESOPHAGOGASTROSTOMY AND PYLOROPLASTY (N/A) FEEDING JEJUNOSTOMY (N/A) CHEST TUBE INSERTION (Left)  1. CV- sinus tachycardia, Increase Bb.  2. Pulm- CXR reviewed. Cannot appreciate a pneumothorax. Await read.  3. Renal- creatinine stable.  4. Endo-BG well controlled on current regimen 5. H & H stable. 6. GI- tolerating tube feedings.   Plan: Encourage Is. Ambulate. Making good post-op progress. Increase BB due to sinus tachycardia, BP can tolerate.     LOS: 5 days    Elgie Collard 04/28/2016  No drainage from neck drain Wounds intact without evidence of infection Plan esophageal swallow Monday  I have seen and examined [name] and agree with the above assessment  and plan.  [name] MD Beeper [phone] Office [phone] 04/28/2016 1:02 PM

## 2016-04-29 MED ORDER — BISACODYL 10 MG RE SUPP
10.0000 mg | Freq: Every day | RECTAL | Status: DC | PRN
  Administered 2016-04-29: 10 mg via RECTAL
  Filled 2016-04-29: qty 1

## 2016-04-29 MED ORDER — VITAL AF 1.2 CAL PO LIQD
1000.0000 mL | ORAL | Status: DC
  Administered 2016-04-30: 1000 mL
  Filled 2016-04-29 (×4): qty 1000

## 2016-04-29 NOTE — Progress Notes (Addendum)
      [address]       [place] [address]             [phone]      6 Days Post-Op Procedure(s) (LRB): VIDEO BRONCHOSCOPY (N/A) TRANSHIATIAL TOTAL ESOPHAGECTOMY COMPLETE; CERVICAL ESOPHAGOGASTROSTOMY AND PYLOROPLASTY (N/A) FEEDING JEJUNOSTOMY (N/A) CHEST TUBE INSERTION (Left) Subjective: Feels good this morning. Walked to end of all   Objective: Vital signs in last 24 hours: Temp:  [98.2 F (36.8 C)-98.9 F (37.2 C)] 98.9 F (37.2 C) (10/08 0520) Pulse Rate:  [102-113] 113 (10/08 0520) Cardiac Rhythm: Sinus tachycardia (10/08 0739) Resp:  [17-20] 20 (10/08 0520) BP: (120-136)/(65-71) 134/65 (10/08 0520) SpO2:  [97 %] 97 % (10/08 0520) Weight:  [121 lb 12.8 oz (55.2 kg)] 121 lb 12.8 oz (55.2 kg) (10/08 0700)     Intake/Output from previous day: 10/07 0701 - 10/08 0700 In: 0  Out: 1350 [Urine:1350] Intake/Output this shift: Total I/O In: 3479 [Other:3479] Out: 200 [Urine:200]  General appearance: alert, cooperative and no distress Heart: S1, S2 normal, sinus tachycardia Lungs: clear to auscultation bilaterally Abdomen: soft, non-tender; bowel sounds normal; no masses,  no organomegaly Extremities: extremities normal, atraumatic, no cyanosis or edema Wound: c/d/i. Small amount of drainage from neck drain  Lab Results:  Recent Labs  04/27/16 0440 04/28/16 0308  WBC 5.9 5.1  HGB 10.9* 11.1*  HCT 33.2* 33.7*  PLT 153 195   BMET:  Recent Labs  04/27/16 0440 04/28/16 0308  NA 138 138  K 3.6 3.9  CL 105 108  CO2 25 24  GLUCOSE 138* 137*  BUN 6 11  CREATININE 0.45* 0.46*  CALCIUM 8.6* 8.7*    PT/INR: No results for input(s): LABPROT, INR in the last 72 hours. ABG    Component Value Date/Time   PHART 7.387 04/24/2016 0334   HCO3 22.4 04/24/2016 0334   TCO2 24 04/24/2016 0334   ACIDBASEDEF 2.0 04/24/2016 0334   O2SAT 97.0 04/24/2016 0334   CBG (last 3)   Recent Labs  04/27/16 0850 04/27/16 1155 04/27/16 1635  GLUCAP  142* 143* 128*    Assessment/Plan: S/P Procedure(s) (LRB): VIDEO BRONCHOSCOPY (N/A) TRANSHIATIAL TOTAL ESOPHAGECTOMY COMPLETE; CERVICAL ESOPHAGOGASTROSTOMY AND PYLOROPLASTY (N/A) FEEDING JEJUNOSTOMY (N/A) CHEST TUBE INSERTION (Left)  1. CV- sinus tachycardia, BB increased yesterday. On atenolol 12.5mg  at home.  2. Pulm- CXR yesterday with stable small pneumo and small pleural effusions. Tolerating room air. 3. Renal- creatinine stable.  4. Endo-BG well controlled on current regimen 5. H & H stable. 6. GI- tolerating tube feedings. Increased to 60/hr yesterday 7. Very Small amount of drainage spotting on dressing where neck drain lies.  Plan: Encourage Is. Ambulate. Making good post-op progress. Remains with sinus tachycardia.    LOS: 6 days    Elgie Collard 04/29/2016   minimal neck drainage at penrose site  dulcolax suppository today Swallow tomorrow and follow up chest xray.

## 2016-04-30 ENCOUNTER — Inpatient Hospital Stay (HOSPITAL_COMMUNITY): Payer: Non-veteran care

## 2016-04-30 LAB — BASIC METABOLIC PANEL
Anion gap: 9 (ref 5–15)
BUN: 16 mg/dL (ref 6–20)
CO2: 24 mmol/L (ref 22–32)
Calcium: 8.8 mg/dL — ABNORMAL LOW (ref 8.9–10.3)
Chloride: 103 mmol/L (ref 101–111)
Creatinine, Ser: 0.45 mg/dL — ABNORMAL LOW (ref 0.61–1.24)
GFR calc Af Amer: >60 mL/min (ref >60)
GFR calc non Af Amer: >60 mL/min (ref >60)
Glucose, Bld: 153 mg/dL — ABNORMAL HIGH (ref 65–99)
Potassium: 3.7 mmol/L (ref 3.5–5.1)
Sodium: 136 mmol/L (ref 135–145)

## 2016-04-30 LAB — CBC
HCT: 36.1 % — ABNORMAL LOW (ref 39.0–52.0)
Hemoglobin: 11.9 g/dL — ABNORMAL LOW (ref 13.0–17.0)
MCH: 30.6 pg (ref 26.0–34.0)
MCHC: 33 g/dL (ref 30.0–36.0)
MCV: 92.8 fL (ref 78.0–100.0)
Platelets: 263 10*3/uL (ref 150–400)
RBC: 3.89 MIL/uL — ABNORMAL LOW (ref 4.22–5.81)
RDW: 13.1 % (ref 11.5–15.5)
WBC: 9.4 10*3/uL (ref 4.0–10.5)

## 2016-04-30 MED ORDER — IOPAMIDOL (ISOVUE-300) INJECTION 61%
100.0000 mL | Freq: Once | INTRAVENOUS | Status: AC | PRN
  Administered 2016-04-30: 100 mL via ORAL

## 2016-04-30 MED ORDER — VITAL AF 1.2 CAL PO LIQD
1000.0000 mL | ORAL | Status: DC
  Administered 2016-04-30 – 2016-05-03 (×4): 1000 mL
  Filled 2016-04-30 (×7): qty 1000

## 2016-04-30 MED ORDER — IOPAMIDOL (ISOVUE-300) INJECTION 61%
INTRAVENOUS | Status: AC
  Filled 2016-04-30: qty 150

## 2016-04-30 NOTE — Progress Notes (Addendum)
[address]       [place] [address]             [phone]      7 Days Post-Op Procedure(s) (LRB): VIDEO BRONCHOSCOPY (N/A) TRANSHIATIAL TOTAL ESOPHAGECTOMY COMPLETE; CERVICAL ESOPHAGOGASTROSTOMY AND PYLOROPLASTY (N/A) FEEDING JEJUNOSTOMY (N/A) CHEST TUBE INSERTION (Left) Subjective: Feeling pretty well, no nausea, small BM and passing flatus  Objective: Vital signs in last 24 hours: Temp:  [97.7 F (36.5 C)-98.8 F (37.1 C)] 97.9 F (36.6 C) (10/09 0359) Pulse Rate:  [109-117] 117 (10/09 0359) Cardiac Rhythm: Sinus tachycardia (10/08 2015) Resp:  [17-20] 20 (10/09 0359) BP: (126-151)/(64-75) 151/72 (10/09 0359) SpO2:  [94 %-98 %] 98 % (10/09 0359) Weight:  [121 lb 6.4 oz (55.1 kg)] 121 lb 6.4 oz (55.1 kg) (10/09 0359)  Hemodynamic parameters for last 24 hours:    Intake/Output from previous day: 10/08 0701 - 10/09 0700 In: 4023  Out: 400 [Urine:400] Intake/Output this shift: No intake/output data recorded.  General appearance: alert, cooperative and no distress Heart: regular rate and rhythm and tachy Lungs: min dim in left base Abdomen: benign, + BS Extremities: no edema Wound: incis healing well  Lab Results:  Recent Labs  04/28/16 0308 04/30/16 0425  WBC 5.1 9.4  HGB 11.1* 11.9*  HCT 33.7* 36.1*  PLT 195 263   BMET:  Recent Labs  04/28/16 0308 04/30/16 0425  NA 138 136  K 3.9 3.7  CL 108 103  CO2 24 24  GLUCOSE 137* 153*  BUN 11 16  CREATININE 0.46* 0.45*  CALCIUM 8.7* 8.8*    PT/INR: No results for input(s): LABPROT, INR in the last 72 hours. ABG    Component Value Date/Time   PHART 7.387 04/24/2016 0334   HCO3 22.4 04/24/2016 0334   TCO2 24 04/24/2016 0334   ACIDBASEDEF 2.0 04/24/2016 0334   O2SAT 97.0 04/24/2016 0334   CBG (last 3)   Recent Labs  04/27/16 0850 04/27/16 1155 04/27/16 1635  GLUCAP 142* 143* 128*    Meds Scheduled Meds: . chlorhexidine  15 mL Mouth Rinse BID  . enoxaparin  (LOVENOX) injection  30 mg Subcutaneous Q24H  . famotidine (PEPCID) IV  20 mg Intravenous Q12H  . fluticasone  1 spray Each Nare Daily  . iopamidol      . mouth rinse  15 mL Mouth Rinse q12n4p  . metoCLOPramide (REGLAN) injection  10 mg Intravenous Q8H  . metoprolol tartrate  37.5 mg Per J Tube BID  . sodium chloride flush  10 mL Intravenous Q12H  . sodium chloride flush  10-40 mL Intracatheter Q12H   Continuous Infusions: . dextrose 5 % and 0.45 % NaCl with KCl 20 mEq/L 10 mL/hr at 04/27/16 1300  . feeding supplement (VITAL AF 1.2 CAL) 1,000 mL (04/30/16 0735)   PRN Meds:.oxyCODONE **AND** acetaminophen, bisacodyl, ondansetron (ZOFRAN) IV, potassium chloride, sodium chloride flush  Xrays Dg Chest 2 View  Result Date: 04/28/2016 CLINICAL DATA:  Esophageal carcinoma with recent esophagectomy EXAM: CHEST  2 VIEW COMPARISON:  April 27, 2016 FINDINGS: Pneumothorax on the left remains stable. No tension component. There is consolidation in the left base. There is atelectatic change in the right base with small pleural effusions noted bilaterally. There is no pulmonary edema evident currently. Heart is upper normal in size with pulmonary vascularity within normal limits. No adenopathy evident. Central catheter tip is in the superior vena cava. IMPRESSION: Stable pneumothorax on the left without progression or tension component.  Persistent small pleural effusions with left base consolidation and mild right base atelectasis. Stable cardiac prominence. No new opacity. Electronically Signed   By: Lowella Grip III M.D.   On: 04/28/2016 11:03    Assessment/Plan: S/P Procedure(s) (LRB): VIDEO BRONCHOSCOPY (N/A) TRANSHIATIAL TOTAL ESOPHAGECTOMY COMPLETE; CERVICAL ESOPHAGOGASTROSTOMY AND PYLOROPLASTY (N/A) FEEDING JEJUNOSTOMY (N/A) CHEST TUBE INSERTION (Left)  1 doing well 2 swallow this am 3 labs stable, H/H improved 4 sugars elevated but acceptable 5 tol TF's   LOS: 7 days     GOLD,[name] 04/30/2016  Dg Chest 2 View  Result Date: 04/30/2016 CLINICAL DATA:  Esophageal carcinoma, status post gastric swing through procedure EXAM: CHEST  2 VIEW COMPARISON:  April 28, 2016 FINDINGS: Contrast remains area of gastric swing through procedure with very little contrast seen in the small bowel. There is a moderate pleural effusion on the left with moderate pneumothorax on the left. No contrast is seen within this pleural effusion. The lungs elsewhere are clear. Heart size and pulmonary vascularity normal. No adenopathy. Central catheter tip is in superior vena cava. There is anterior wedging of the L1 vertebral body. IMPRESSION: Moderate hydropneumothorax on the left without tension component. No contrast extravasation into the fluid. Lungs elsewhere clear. Cardiac silhouette within normal limits. Critical Value/emergent results were called by telephone at the time of interpretation on 04/30/2016 at 8:34 am to Dr. Lanelle Bal , who verbally acknowledged these results. Electronically Signed   By: Lowella Grip III M.D.   On: 04/30/2016 08:35   Dg Esophagus W/water Sol Cm  Result Date: 04/30/2016 CLINICAL DATA:  Postop esophagectomy and gastric pull-through for esophageal cancer EXAM: ESOPHOGRAM/BARIUM SWALLOW TECHNIQUE: Single contrast examination was performed using  water-soluble. FLUOROSCOPY TIME:  Fluoroscopy Time:  1 minutes 30 seconds. Radiation Exposure Index (if provided by the fluoroscopic device): Number of Acquired Spot Images: 6 COMPARISON:  None. FINDINGS: A limited examination was performed in the semi recumbent supine position. There are postoperative changes of esophagectomy and gastric pull-through. Water-soluble contrast opacifies the gastric pull-through and proximal small bowel. No leak. Compression deformity in the lumbar spine is incidentally noted. IMPRESSION: Postoperative changes of esophagectomy and gastric pull-through without complicating feature.  Electronically Signed   By: Lorin Picket M.D.   On: 04/30/2016 08:36   No drainage from neck, advance diet to sips of clears  D/c central line I have seen and examined [name] and agree with the above assessment  and plan.  [name] MD Beeper [phone] Office [phone] 04/30/2016 6:17 PM

## 2016-04-30 NOTE — Progress Notes (Signed)
Nutrition Follow Up  DOCUMENTATION CODES:   Non-severe (moderate) malnutrition in context of chronic illness  INTERVENTION:    Rec increase Vital AF 1.2 to goal rate of 70 ml/hr  NUTRITION DIAGNOSIS:   Inadequate oral intake related to inability to eat (s/p esophagectomy) as evidenced by NPO status, ongoing  GOAL:   Patient will meet greater than or equal to 90% of their needs, met  MONITOR:   Diet advancement, PO intake, Labs, Weight trends, Skin, I & O's  ASSESSMENT:   75 y.o. Male seen in the office at his request for a second surgical opinion concerning his diagnosed adenocarcinoma the distal esophagus. The patient gives a history of many years of reflux symptoms taking acid blocking medications. For 3 months he had noted increasing difficulty swallowing specially solid food and bread. The patient completed radiation and chemotherapy at [facility]   Patient s/p procedures 10/2: TRANSHIATIAL TOTAL ESOPHAGECTOMY COMPLETE  FEEDING JEJUNOSTOMY  CHEST TUBE INSERTION (Left) Cervical esophagogastric anastomosis Pyloroplasty   Vital AF 1.2 formula currently infusing at 65 ml/hr via J-tube; tolerating well. S/p esophogram today >> no leak. He is allowed sips of water. No nausea; states he's having some diarrhea. Labs and medications reviewed.  Diet Order:  Diet NPO time specified Except for: Other (See Comments)   CBG (last 3)   Recent Labs  04/27/16 1635  GLUCAP 128*    Skin:  Reviewed, no issues  Last BM:  10/8  Height:   Ht Readings from Last 1 Encounters:  04/23/16 5' 5"  (1.651 m)    Weight:   Wt Readings from Last 1 Encounters:  04/30/16 121 lb 6.4 oz (55.1 kg)    Wt Readings from Last 20 Encounters:  04/30/16 121 lb 6.4 oz (55.1 kg)  04/20/16 126 lb 3.2 oz (57.2 kg)  04/12/16 125 lb 6.4 oz (56.9 kg)  04/05/16 125 lb 12.8 oz (57.1 kg)  03/29/16 125 lb 12.8 oz (57.1 kg)  03/20/16 120 lb 8 oz (54.7 kg)  03/06/16 120 lb 14.4 oz (54.8 kg)   03/06/16 120 lb (54.4 kg)  02/29/16 119 lb 11.2 oz (54.3 kg)  02/25/16 123 lb (55.8 kg)  02/23/16 123 lb (55.8 kg)  02/15/16 129 lb (58.5 kg)  02/15/16 128 lb 9.6 oz (58.3 kg)  02/08/16 121 lb 1.6 oz (54.9 kg)  02/02/16 122 lb (55.3 kg)  02/01/16 124 lb 11.2 oz (56.6 kg)  01/30/16 124 lb (56.2 kg)  01/27/16 125 lb 14.4 oz (57.1 kg)  01/23/16 127 lb 6.4 oz (57.8 kg)  01/20/16 130 lb 3.2 oz (59.1 kg)    Ideal Body Weight:  62 kg  BMI:  Body mass index is 20.2 kg/m.  Estimated Nutritional Needs:   Kcal:  2000-2200  Protein:  115-125 gm  Fluid:  2.0-2.2 L  EDUCATION NEEDS:   No education needs identified at this time  Arthur Holms, RD, LDN Pager #: [phone] After-Hours Pager #: [phone]

## 2016-05-01 MED ORDER — METOPROLOL TARTRATE 25 MG/10 ML ORAL SUSPENSION
25.0000 mg | Freq: Three times a day (TID) | ORAL | Status: DC
  Administered 2016-05-01 – 2016-05-04 (×10): 25 mg via JEJUNOSTOMY
  Filled 2016-05-01 (×10): qty 10

## 2016-05-01 NOTE — Progress Notes (Addendum)
[address]       [place] [address]             [phone]      8 Days Post-Op Procedure(s) (LRB): VIDEO BRONCHOSCOPY (N/A) TRANSHIATIAL TOTAL ESOPHAGECTOMY COMPLETE; CERVICAL ESOPHAGOGASTROSTOMY AND PYLOROPLASTY (N/A) FEEDING JEJUNOSTOMY (N/A) CHEST TUBE INSERTION (Left) Subjective: Some loose stools, no nausea, some abdominal incisional pain primarily with cough/movement. No trouble with water/apple juice. Remains tachycardic  Objective: Vital signs in last 24 hours: Temp:  [97.8 F (36.6 C)-98.1 F (36.7 C)] 97.8 F (36.6 C) (10/10 0621) Pulse Rate:  [111-127] 114 (10/10 0621) Cardiac Rhythm: Sinus tachycardia (10/09 1900) Resp:  [19-20] 20 (10/10 0621) BP: (114-137)/(64-77) 137/65 (10/10 0621) SpO2:  [97 %-98 %] 98 % (10/10 0621)  Hemodynamic parameters for last 24 hours:    Intake/Output from previous day: 10/09 0701 - 10/10 0700 In: 150  Out: 300 [Urine:300] Intake/Output this shift: No intake/output data recorded.  General appearance: alert, cooperative and no distress Heart: regular rate and rhythm and tachy Lungs: mildly dim in bases Abdomen: mild incis tenderrness, no distension, + BS Extremities: no edema Wound: incis healing well  Lab Results:  Recent Labs  04/30/16 0425  WBC 9.4  HGB 11.9*  HCT 36.1*  PLT 263   BMET:  Recent Labs  04/30/16 0425  NA 136  K 3.7  CL 103  CO2 24  GLUCOSE 153*  BUN 16  CREATININE 0.45*  CALCIUM 8.8*    PT/INR: No results for input(s): LABPROT, INR in the last 72 hours. ABG    Component Value Date/Time   PHART 7.387 04/24/2016 0334   HCO3 22.4 04/24/2016 0334   TCO2 24 04/24/2016 0334   ACIDBASEDEF 2.0 04/24/2016 0334   O2SAT 97.0 04/24/2016 0334   CBG (last 3)  No results for input(s): GLUCAP in the last 72 hours.  Meds Scheduled Meds: . chlorhexidine  15 mL Mouth Rinse BID  . enoxaparin (LOVENOX) injection  30 mg Subcutaneous Q24H  . famotidine (PEPCID) IV  20 mg  Intravenous Q12H  . fluticasone  1 spray Each Nare Daily  . mouth rinse  15 mL Mouth Rinse q12n4p  . metoCLOPramide (REGLAN) injection  10 mg Intravenous Q8H  . metoprolol tartrate  37.5 mg Per J Tube BID  . sodium chloride flush  10 mL Intravenous Q12H  . sodium chloride flush  10-40 mL Intracatheter Q12H   Continuous Infusions: . dextrose 5 % and 0.45 % NaCl with KCl 20 mEq/L 10 mL/hr at 04/30/16 2255  . feeding supplement (VITAL AF 1.2 CAL) 1,000 mL (04/30/16 2306)   PRN Meds:.oxyCODONE **AND** acetaminophen, bisacodyl, ondansetron (ZOFRAN) IV, potassium chloride, sodium chloride flush  Xrays Dg Chest 2 View  Result Date: 04/30/2016 CLINICAL DATA:  Esophageal carcinoma, status post gastric swing through procedure EXAM: CHEST  2 VIEW COMPARISON:  April 28, 2016 FINDINGS: Contrast remains area of gastric swing through procedure with very little contrast seen in the small bowel. There is a moderate pleural effusion on the left with moderate pneumothorax on the left. No contrast is seen within this pleural effusion. The lungs elsewhere are clear. Heart size and pulmonary vascularity normal. No adenopathy. Central catheter tip is in superior vena cava. There is anterior wedging of the L1 vertebral body. IMPRESSION: Moderate hydropneumothorax on the left without tension component. No contrast extravasation into the fluid. Lungs elsewhere clear. Cardiac silhouette within normal limits. Critical Value/emergent results were called by telephone at the time  of interpretation on 04/30/2016 at 8:34 am to Dr. Lanelle Bal , who verbally acknowledged these results. Electronically Signed   By: Lowella Grip III M.D.   On: 04/30/2016 08:35   Dg Esophagus W/water Sol Cm  Result Date: 04/30/2016 CLINICAL DATA:  Postop esophagectomy and gastric pull-through for esophageal cancer EXAM: ESOPHOGRAM/BARIUM SWALLOW TECHNIQUE: Single contrast examination was performed using  water-soluble. FLUOROSCOPY TIME:   Fluoroscopy Time:  1 minutes 30 seconds. Radiation Exposure Index (if provided by the fluoroscopic device): Number of Acquired Spot Images: 6 COMPARISON:  None. FINDINGS: A limited examination was performed in the semi recumbent supine position. There are postoperative changes of esophagectomy and gastric pull-through. Water-soluble contrast opacifies the gastric pull-through and proximal small bowel. No leak. Compression deformity in the lumbar spine is incidentally noted. IMPRESSION: Postoperative changes of esophagectomy and gastric pull-through without complicating feature. Electronically Signed   By: Lorin Picket M.D.   On: 04/30/2016 08:36    Assessment/Plan: S/P Procedure(s) (LRB): VIDEO BRONCHOSCOPY (N/A) TRANSHIATIAL TOTAL ESOPHAGECTOMY COMPLETE; CERVICAL ESOPHAGOGASTROSTOMY AND PYLOROPLASTY (N/A) FEEDING JEJUNOSTOMY (N/A) CHEST TUBE INSERTION (Left)   1 check EKG to see if poss aflutter- appears to be sinus tachy on monitor 2 clear liquids 3 neck drain advanced 1/2 inch 4 increase lopressor to TID   LOS: 8 days    GOLD,[name] 05/01/2016  Stable advancing diet slowly I have seen and examined Fred [name] and agree with the above assessment  and plan.  [name] MD Beeper [phone] Office [phone] 05/01/2016 1:20 PM

## 2016-05-01 NOTE — Care Management Note (Addendum)
Case Management Note Marvetta Gibbons RN, BSN Unit 2W-Case Manager [phone]  Patient Details  Name: Fred [name] MRN: [id number] Date of Birth: 1941-02-22  Subjective/Objective:   Pt admitted s/p  TRANSHIATIAL TOTAL ESOPHAGECTOMY COMPLETE; CERVICAL ESOPHAGOGASTROSTOMY AND PYLOROPLASTY  FEEDING JEJUNOSTOMY               tx from ICU to 2W on 04/27/16  Action/Plan: PTA pt lived at home with wife- plan to return home when medically stable- has feeding tube in place- CM to follow for d/c needs- [facility]-  Has been on TF at home in past  Expected Discharge Date:                  Expected Discharge Plan:  [place]  In-House Referral:     Discharge planning Services  CM Consult  Post Acute Care Choice:    Choice offered to:     DME Arranged:    DME Agency:     HH Arranged:    [place] Agency:     Status of Service:  In process, will continue to follow  If discussed at Long Length of Stay Meetings, dates discussed:    Additional Comments:  Dawayne Patricia, RN 05/01/2016, 8:45 PM

## 2016-05-02 DIAGNOSIS — R000 Tachycardia, unspecified: Secondary | ICD-10-CM

## 2016-05-02 LAB — T4, FREE: Free T4: 1.31 ng/dL — ABNORMAL HIGH (ref 0.61–1.12)

## 2016-05-02 NOTE — Consult Note (Signed)
Cardiology Consultation Note    Patient ID: Fred [name], MRN: [id number], DOB/AGE: 1941-05-31 75 y.o. Admit date: 04/23/2016   Date of Consult: 05/02/2016 Primary Physician: [name], MD Primary Cardiologist: Dr. Percival Spanish  Chief Complaint: here for esophageal surgery Reason for Consultation: sinus tachycardia Requesting MD: Dr. Servando Snare  HPI: Fred [name] is a 75 y.o. male with history of HTN, GERD, esophageal cancer who presented to [facility] for planned esophageal surgery. He has undergone chemotherapy with carboplatin and Taxol along with concurrent radiation. On 04/27/16 he underwent bronchoscopy, transhiatal total esophagectomy with cervical esophagogastrostomy, pyloroplasty, feeding jejunostomy and left chest tube. Post-op CXRs notable for moderate hydropneumothorax without tension component. He has otherwise had fairly uncomplicated recovery - walking in the halls 3x a day, no chest pain, SOB, dizziness, presyncope, syncope or bleeding reported. He was noted to have persistent sinus tachycardia on telemetry with HR initially 130s-140s, now down to 105-110 on Lopressor solution 25mg  TID started yesterday, titrated this admission. ROS also notable for night sweats and weight loss. Review of Epic reveals episodic tachycardia with HR up to 130s-140s on entered vitals since July and August 2017, interspersed with HR 80s-90s. The patient reports this coincides with some sort of BP med initiation from the [place]. Per Dr. Rosezella Florida note, he had reviewed records from May when patient had negative stress test at the [facility] and echo showing mild mitral regurgitation and aortic insufficiency that was going to be followed clinically. He is not tachypneic, SOB or hypoxic. No signs or symptoms of DVT.   Past Medical History:  Diagnosis Date  . 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 GERD (gastroesophageal reflux disease)   . Hypertension       Surgical History:  Past Surgical History:   Procedure Laterality Date  . CHEST TUBE INSERTION Left 04/23/2016   Procedure: CHEST TUBE INSERTION;  Surgeon: [name], MD;  Location: [place];  Service: Thoracic;  Laterality: Left;  . COMPLETE ESOPHAGECTOMY N/A 04/23/2016   Procedure: TRANSHIATIAL TOTAL ESOPHAGECTOMY COMPLETE; CERVICAL ESOPHAGOGASTROSTOMY AND PYLOROPLASTY;  Surgeon: [name], MD;  Location: [place];  Service: Thoracic;  Laterality: N/A;  . cystoscope  4/12/1   prostate  . ERCP    . INGUINAL HERNIA REPAIR    . JEJUNOSTOMY N/A 04/23/2016   Procedure: FEEDING JEJUNOSTOMY;  Surgeon: [name], MD;  Location: [place];  Service: Thoracic;  Laterality: N/A;  . LEG SURGERY Left    hole  . PROSTATE BIOPSY  10/05/15  . right shoulder surgery    . VIDEO BRONCHOSCOPY N/A 04/23/2016   Procedure: VIDEO BRONCHOSCOPY;  Surgeon: [name], MD;  Location: [facility] OR;  Service: Thoracic;  Laterality: N/A;     Home Meds: Prior to Admission medications   Medication Sig Start Date End Date Taking? Authorizing Provider  atenolol (TENORMIN) 25 MG tablet Take 12.5 mg by mouth daily.    Yes Historical Provider, MD  fluticasone (FLONASE) 50 MCG/ACT nasal spray Place 1 spray into both nostrils daily as needed for allergies or rhinitis.   Yes Historical Provider, MD  oxyCODONE (OXY IR/ROXICODONE) 5 MG immediate release tablet Take 1-2 tablets (5-10 mg total) by mouth every 4 (four) hours as needed for severe pain. 03/23/16  Yes Truitt Merle, MD  Nutritional Supplements (FEEDING SUPPLEMENT, OSMOLITE 1.5 CAL,) LIQD Increase Osmolite 1.5 to 70 mL/hr continuously over 24 hours via Jejunostomy with 50 mL free water flush every hour. Flush with 120 mL water  once daily when changing bag. Patient not taking: Reported on 04/17/2016 03/12/16   Truitt Merle, MD    Inpatient Medications:  . chlorhexidine  15 mL Mouth Rinse BID  . enoxaparin (LOVENOX) injection  30 mg Subcutaneous Q24H  . famotidine (PEPCID) IV  20 mg Intravenous Q12H  .  fluticasone  1 spray Each Nare Daily  . mouth rinse  15 mL Mouth Rinse q12n4p  . metoCLOPramide (REGLAN) injection  10 mg Intravenous Q8H  . metoprolol tartrate  25 mg Per J Tube TID   . dextrose 5 % and 0.45 % NaCl with KCl 20 mEq/L 10 mL/hr at 05/01/16 1509  . feeding supplement (VITAL AF 1.2 CAL) 1,000 mL (05/02/16 0405)    Allergies:  Allergies  Allergen Reactions  . No Known Allergies     Social History   Social History  . Marital status: Married    Spouse name: N/A  . Number of children: 1  . Years of education: N/A   Occupational History  . Truck Geophysicist/field seismologist    Social History Main Topics  . Smoking status: Former Smoker    Packs/day: 1.00    Years: 10.00    Quit date: 07/24/1967  . Smokeless tobacco: Never Used  . Alcohol use No  . Drug use: No  . Sexual activity: Not Currently   Other Topics Concern  . Not on file   Social History Narrative  . No narrative on file     Family History  Problem Relation Age of Onset  . Cancer Maternal Grandfather 25    gastric cancer   . Alzheimer's disease Mother   . Diabetes Mother   . Stroke Father 13     Review of Systems: All other systems reviewed and are otherwise negative except as noted above.  Labs: No results for input(s): CKTOTAL, CKMB, TROPONINI in the last 72 hours. Lab Results  Component Value Date   WBC 9.4 04/30/2016   HGB 11.9 (L) 04/30/2016   HCT 36.1 (L) 04/30/2016   MCV 92.8 04/30/2016   PLT 263 04/30/2016    Recent Labs Lab 04/28/16 0308 04/30/16 0425  NA 138 136  K 3.9 3.7  CL 108 103  CO2 24 24  BUN 11 16  CREATININE 0.46* 0.45*  CALCIUM 8.7* 8.8*  PROT 5.3*  --   BILITOT 0.6  --   ALKPHOS 72  --   ALT 18  --   AST 20  --   GLUCOSE 137* 153*   No results found for: CHOL, HDL, LDLCALC, TRIG No results found for: DDIMER  Radiology/Studies:  Dg Chest 2 View  Result Date: 04/30/2016 CLINICAL DATA:  Esophageal carcinoma, status post gastric swing through procedure EXAM: CHEST  2  VIEW COMPARISON:  April 28, 2016 FINDINGS: Contrast remains area of gastric swing through procedure with very little contrast seen in the small bowel. There is a moderate pleural effusion on the left with moderate pneumothorax on the left. No contrast is seen within this pleural effusion. The lungs elsewhere are clear. Heart size and pulmonary vascularity normal. No adenopathy. Central catheter tip is in superior vena cava. There is anterior wedging of the L1 vertebral body. IMPRESSION: Moderate hydropneumothorax on the left without tension component. No contrast extravasation into the fluid. Lungs elsewhere clear. Cardiac silhouette within normal limits. Critical Value/emergent results were called by telephone at the time of interpretation on 04/30/2016 at 8:34 am to Dr. Lanelle Bal , who verbally acknowledged these results. Electronically Signed  By: Lowella Grip III M.D.   On: 04/30/2016 08:35   Dg Chest 2 View  Result Date: 04/28/2016 CLINICAL DATA:  Esophageal carcinoma with recent esophagectomy EXAM: CHEST  2 VIEW COMPARISON:  April 27, 2016 FINDINGS: Pneumothorax on the left remains stable. No tension component. There is consolidation in the left base. There is atelectatic change in the right base with small pleural effusions noted bilaterally. There is no pulmonary edema evident currently. Heart is upper normal in size with pulmonary vascularity within normal limits. No adenopathy evident. Central catheter tip is in the superior vena cava. IMPRESSION: Stable pneumothorax on the left without progression or tension component. Persistent small pleural effusions with left base consolidation and mild right base atelectasis. Stable cardiac prominence. No new opacity. Electronically Signed   By: Lowella Grip III M.D.   On: 04/28/2016 11:03   Dg Chest 2 View  Result Date: 04/20/2016 CLINICAL DATA:  Esophageal carcinoma EXAM: CHEST  2 VIEW COMPARISON:  Chest CT March 20, 2016 FINDINGS: Lungs  are clear. Heart size and pulmonary vascularity are normal. No adenopathy evident. There is degenerative change in the thoracic spine. No blastic or lytic bone lesions are evident. IMPRESSION: No edema or consolidation. No adenopathy. No esophageal wall thickening is evident by radiography. Electronically Signed   By: Lowella Grip III M.D.   On: 04/20/2016 09:37   Mr Liver W Wo Contrast  Result Date: 04/10/2016 CLINICAL DATA:  Esophageal cancer, possible liver lesion on CT EXAM: MRI ABDOMEN WITHOUT AND WITH CONTRAST TECHNIQUE: Multiplanar multisequence MR imaging of the abdomen was performed both before and after the administration of intravenous contrast. CONTRAST:  90mL MULTIHANCE GADOBENATE DIMEGLUMINE 529 MG/ML IV SOLN COMPARISON:  CT abdomen/pelvis dated 03/20/2016 FINDINGS: Lower chest: Lung bases are clear. Hepatobiliary: Scattered benign hepatic cysts measuring up to 6 mm in the right hepatic lobe (series 8/image 6). 8 mm lesion in segment 7 (series 8/image 9), with suspected peripheral nodular discontinuous enhancement (series 12/image 22), suggesting a benign hemangioma. Additional 10 mm T2 hyperintense lesion in segment 6 (series 8/image 13), with associated perfusion anomaly on the early arterial phase (series 12/image 33), and persistent central enhancement (series 19/image 35), possibly reflecting a hemangioma or vascular malformation. Metastatic disease is considered unlikely but is not entirely excluded. Gallbladder is unremarkable. No intrahepatic or extrahepatic ductal dilatation. Pancreas: 8 mm nonaggressive pancreatic cyst along the pancreatic tail (series 8/image 15). No pancreatic atrophy or ductal dilatation. Spleen:  Within normal limits. Adrenals/Urinary Tract: 12 mm left adrenal nodule (series 8/ image 15), unchanged. No definite intracellular lipid to suggest a benign adenoma. Right adrenal gland is within normal limits. Scattered bilateral renal cysts, including a 4.4 cm left  upper pole renal cyst (series 8/ image 14) and a 5.9 cm right lower pole renal cyst (series 8/ image 26). No hydronephrosis. Stomach/Bowel: Stomach is notable for a tiny hiatal hernia. Visualized bowel is unremarkable. Vascular/Lymphatic:  No evidence of abdominal aortic aneurysm. No suspicious abdominal lymphadenopathy. Other:  No abdominal ascites. Musculoskeletal: No focal osseous lesions. IMPRESSION: 10 mm enhancing lesion in segment 6 favors a vascular malformation or hemangioma, with metastatic disease considered unlikely. Consider follow CT or MR in 3-6 months, as clinically warranted 8 mm lesion in segment 7 is compatible with a benign hemangioma. Additional scattered hepatic cysts. 12 mm left adrenal nodule, unchanged, indeterminate. Metastasis is not excluded. **An incidental finding of potential clinical significance has been found. 8 mm nonaggressive pancreatic cyst along the pancreatic tail. Follow-up CT or  MR abdomen with/without contrast is suggested in 1 year.** Electronically Signed   By: Julian Hy M.D.   On: 04/10/2016 16:23   Dg Chest Port 1 View  Result Date: 04/27/2016 CLINICAL DATA:  Status post esophagectomy with jejunal interposition EXAM: PORTABLE CHEST 1 VIEW COMPARISON:  Portable chest x-ray of April 26, 2016 FINDINGS: There has been interval removal of the left chest tube with development of an approximately 15-20% left-sided pneumothorax. There is persistent left basilar atelectasis. The interstitial markings on the right remain increased and are slightly more prominent today. The cardiac silhouette remains enlarged. The pulmonary vascularity is mildly prominent and indistinct. There is no significant pleural effusion. The right internal jugular venous catheter tip projects over the midportion of the SVC. The esophagogastric tube has been removed. IMPRESSION: Interval development of a 15-20% left-sided pneumothorax since removal of the left chest tube. Persistent left lower  lobe atelectasis. Slight increased prominence of the pulmonary interstitium on the right. Stable cardiomegaly. These results were called by telephone at the time of interpretation on 04/27/2016 at 7:41 am to [facility], RN, , who verbally acknowledged these results. No active disease. Electronically Signed   By: David  Martinique M.D.   On: 04/27/2016 07:45   Dg Chest Port 1 View  Result Date: 04/26/2016 CLINICAL DATA:  Chest pain EXAM: PORTABLE CHEST 1 VIEW COMPARISON:  04/25/2016 FINDINGS: Nasogastric catheter is noted within the distal esophagus stable from the prior exam. Right jugular central line is again noted and stable. Left-sided thoracostomy catheter is seen. The previously noted pneumothorax is less well appreciated. Bibasilar atelectatic changes and small pleural effusions are again seen. No new focal abnormality is noted. IMPRESSION: Resolution of previously seen apical pneumothorax. Persistent bibasilar atelectasis and pleural effusions. Electronically Signed   By: Inez Catalina M.D.   On: 04/26/2016 07:51   Dg Chest Port 1 View  Result Date: 04/25/2016 CLINICAL DATA:  Chest tube treatment for left pneumothorax. Status post esophagectomy for malignancy EXAM: PORTABLE CHEST 1 VIEW COMPARISON:  Portable chest x-ray of April 24, 2016 FINDINGS: The lungs are mildly hypo inflated. A small apical pneumothorax on the left is stable the left chest tube tip projects over the posterior aspect of the left fifth rib. There is bibasilar atelectasis greatest on the left. There is no significant pleural effusion. The heart is normal in size. There is calcification in the wall of the aortic arch. The esophagogastric tube is in stable position with the proximal port overlying the distal third of the reconstructed esophagus with the tip at the expected location of the former GE junction. The tip of the right internal jugular venous catheter projects over the midportion of the SVC. IMPRESSION: 1. Stable less  than 5% left apical pneumothorax. Stable positioning of the left chest tube. Persistent left lower lobe atelectasis with minimal atelectasis on the right. No significant pleural effusion. 2. Mild cardiomegaly without pulmonary vascular congestion. Aortic atherosclerosis. 3. The esophagogastric tube is in stable position as described. Electronically Signed   By: David  Martinique M.D.   On: 04/25/2016 07:29   Dg Chest Port 1 View  Result Date: 04/24/2016 CLINICAL DATA:  75 year old male with repositioning of nasogastric tube. Subsequent encounter. EXAM: PORTABLE CHEST 1 VIEW COMPARISON:  04/24/2016. FINDINGS: Post esophagectomy with gastric pull-up. Nasogastric tube tip at the level of the left hemidiaphragm. Side hole 3 cm below carina level. Consolidation medial aspect lung bases bilaterally may represent atelectasis related to surgery. Basal infiltrates and small left pleural effusion not  excluded. Pulmonary vascular congestion. Right central line tip distal superior vena cava level. Left-sided chest tube is in place. There may be a tiny left apical pneumothorax. Cardiomegaly. IMPRESSION: Post esophagectomy with gastric pull-up. Nasogastric tube tip at the level of the left hemidiaphragm. Side hole 3 cm below carina level. Consolidation medial aspect lung bases bilaterally may represent atelectasis related to surgery. Basal infiltrates and small left pleural effusion not excluded. Pulmonary vascular congestion. Left-sided chest tube is in place. There may be a tiny left apical pneumothorax. Electronically Signed   By: Genia Del M.D.   On: 04/24/2016 07:55   Dg Chest Port 1 View  Result Date: 04/24/2016 CLINICAL DATA:  Esophageal cancer.  Follow-up chest tube. EXAM: PORTABLE CHEST 1 VIEW COMPARISON:  04/23/2016 FINDINGS: Nasogastric tube has its tip at the gastroesophageal junction. Right internal jugular central line has its tip in the SVC 2 cm above the right atrium. Endotracheal tube has been removed.  Left chest tube remains in place. No visible pneumothorax. Volume loss in both lower lobes persists, left worse than right. IMPRESSION: Extubated. No pneumothorax. Nasogastric tube tip at the gastroesophageal junction. Persistent lower lobe atelectasis left worse than right. Electronically Signed   By: Nelson Chimes M.D.   On: 04/24/2016 07:39   Dg Chest Port 1 View  Result Date: 04/24/2016 CLINICAL DATA:  Esophageal cancer.  Hypertension.  GERD. EXAM: PORTABLE CHEST 1 VIEW COMPARISON:  04/23/2016.  04/20/2016.  CT 03/20/2016. FINDINGS: NG tube tip noted at the gastroesophageal junction. Advancement of 12 cm should be considered if it is desired that the tip of the endotracheal tube should be in the stomach. Right IJ line and left chest tube in stable position. No pneumothorax. Cardiomegaly with mild pulmonary venous congestion. Mild bibasilar atelectasis and/or infiltrates/edema noted. Small left pleural effusion cannot be excluded. IMPRESSION: 1. NG tube tip noted at the gastroesophageal junction. Advancement of approximately 12 cm should be considered if desired for the tip and side hole of the endotracheal tube should be in the stomach in this patient with known esophageal cancer. 2. Right IJ line and left chest tube in stable position. No pneumothorax. 3. Cardiomegaly with pulmonary venous congestion. Mild bibasilar atelectasis and/or infiltrates/edema noted. Small left pleural effusion. A component of congestive heart failure may be present . These results will be called to the ordering clinician or representative by the Radiologist Assistant, and communication documented in the PACS or zVision Dashboard. Electronically Signed   By: Marcello Moores  Register   On: 04/24/2016 07:22   Dg Chest Port 1 View  Result Date: 04/23/2016 CLINICAL DATA:  Status post esophagectomy. EXAM: PORTABLE CHEST 1 VIEW COMPARISON:  PET-CT scan 12/05/2015, chest radiograph 04/20/2016 FINDINGS: NG to extends to the level of the  diaphragm centrally. Endotracheal to 4.3 cm from carina. RIGHT central venous line noted. No pneumothorax. LEFT basilar atelectasis. LEFT chest tube in place. Small LEFT apical pneumothorax. IMPRESSION: 1. Small LEFT apical pneumothorax with chest tube in place. 2. NG tube, endotracheal tube and RIGHT central venous line in good position. 3. LEFT lower lobe atelectasis. Electronically Signed   By: Suzy Bouchard M.D.   On: 04/23/2016 15:59   Dg Intro Long Gi Tube  Result Date: 04/02/2016 CLINICAL DATA:  Lower third esophageal malignancy. Patient presents for replacement of nasoenteric feeding tube, which is clogged. EXAM: INTRO LONG GI TUBE CONTRAST:  None. FLUOROSCOPY TIME:  Fluoroscopy Time:  7 minutes 36 seconds. Number of Acquired Spot Images: 0 exposures (2 screen captures). COMPARISON:  03/23/2016 feeding tube placement. FINDINGS: The existing clogged right nasal route feeding tube was removed. The right nare was anesthetized with topical gel. The right nasal route feeding tube with superstiff Amplatz wire was passed through the esophagus and stomach to the region of the pylorus/ duodenal bulb under fluoroscopy. The tube could not be advanced beyond this location despite multiple attempts. The guidewire was removed. The feeding tube was flushed with 100 cc of water. IMPRESSION: Exchange of right nasal route feeding tube, which was advanced to the region of the pylorus/duodenal bulb. Electronically Signed   By: Ilona Sorrel M.D.   On: 04/02/2016 16:15   Dg Esophagus W/water Sol Cm  Result Date: 04/30/2016 CLINICAL DATA:  Postop esophagectomy and gastric pull-through for esophageal cancer EXAM: ESOPHOGRAM/BARIUM SWALLOW TECHNIQUE: Single contrast examination was performed using  water-soluble. FLUOROSCOPY TIME:  Fluoroscopy Time:  1 minutes 30 seconds. Radiation Exposure Index (if provided by the fluoroscopic device): Number of Acquired Spot Images: 6 COMPARISON:  None. FINDINGS: A limited examination  was performed in the semi recumbent supine position. There are postoperative changes of esophagectomy and gastric pull-through. Water-soluble contrast opacifies the gastric pull-through and proximal small bowel. No leak. Compression deformity in the lumbar spine is incidentally noted. IMPRESSION: Postoperative changes of esophagectomy and gastric pull-through without complicating feature. Electronically Signed   By: Lorin Picket M.D.   On: 04/30/2016 08:36    Wt Readings from Last 3 Encounters:  04/30/16 121 lb 6.4 oz (55.1 kg)  04/20/16 126 lb 3.2 oz (57.2 kg)  04/12/16 125 lb 6.4 oz (56.9 kg)    EKG: sinus tach 122bpm, LAFB, cannot exclude old inferior infarct, no acute changes  Physical Exam: Blood pressure 116/63, pulse (!) 105, temperature 97.9 F (36.6 C), temperature source Oral, resp. rate 17, height 5\' 5"  (075-GRM m), weight 121 lb 6.4 oz (55.1 kg), SpO2 99 %. Body mass index is 20.2 kg/m. General: Thin WM in no acute distress. Head: Normocephalic, atraumatic, sclera non-icteric, no xanthomas, nares are without discharge.  Neck: JVD not elevated. Lungs: Minimally diminished in bases without wheezes, rales, or rhonchi. Breathing is unlabored. Heart: RRR borderline elevated rate with S1 S2. No murmurs, rubs, or gallops appreciated. Abdomen: Soft, non-distended, surgical site noted.  Msk:  Strength and tone appear normal for age. Extremities: No clubbing or cyanosis. No edema. Distal pedal pulses are 2+ and equal bilaterally. Neuro: Alert and oriented X 3. No facial asymmetry. No focal deficit. Moves all extremities spontaneously. Psych:  Responds to questions appropriately with a normal affect.   No results found for: TSH   Assessment and Plan   75 y.o. male with history of HTN, GERD, esophageal cancer (s/p carboplatin, Taxol, radiation) who presented to [facility] for planned esophageal surgery. Noted on telemetry to have persistent sinus tachycardia responsive but persistent with  beta blocker titration.  1. Esophageal CA - per primary team.  2. Persistent sinus tachycardia - review of telemetry and EKGs does appear to be a sinus tach instead of alternative arrhythmia. Intermittent elevated HRs have been documented in TCTS office visits in July and August up to 130s-140s. Will send TSH and free T4 - has reported weight loss and night sweats but suspect these are attributable to #1. Consider repeating echocardiogram to assess for any interim LV dysfunction. I do not have a copy of echo from the [place] but patient and his wife report no heart weakness at that time. Dr. Rosezella Florida notes indicated mild AI and MR only. The patient is  not tachpneic, hypoxic, SOB or having chest pain so PE less likely - d-dimer unlikely to be helpful as he just had surgery. Will review further with cardiologist.  3. Hydropneumothorax by CXR 10/9 - per TCTS. ? Whether this is contributing to his tachycardia.  4. Essential HTN - controlled.  Signed, Charlie Pitter PA-C 05/02/2016, 2:27 PM Pager: [phone]  History and all data above reviewed.  Patient examined.  I agree with the findings as above.  The patient exam reveals [id number]  ,  Lungs: Decreased breath sounds left greater than right lung  ,  Abd: Positive bowel sounds, no rebound no guarding, feeding tube in place, Ext No edema  .  All available labs, radiology testing, previous records reviewed. Agree with documented assessment and plan. Tachycardia:  Sinus tach.  However, I don't see a secondary cause other than chronic disease including malnutrition.  I will order a TSH.  I would also like to see a BNP.  If this came back normal I don't think that repeat echo would be needed.  I don't strongly suspect HF.  I don't suspect a pericardial effusion/tamponade.   Jeneen Rinks Michelle Wnek  3:07 PM  05/02/2016

## 2016-05-02 NOTE — Progress Notes (Signed)
   05/02/16 1119  Mobility  Activity Ambulate in hall  Level of Assistance Modified independent, requires aide device or extra time  Assistive Device None  Distance Ambulated (ft) 500 ft  Ambulation Response Tolerated fair  Bed Position Semi-fowlers   Pt ambulated in hallway. Slow shuffling pace. Pushing IV pole by himself. Pt returned to bed, wife at bedside. Call bell and phone within reach. Will continue to monitor.

## 2016-05-02 NOTE — Progress Notes (Addendum)
Fred [name]       [place] [address]             [phone]      9 Days Post-Op Procedure(s) (LRB): VIDEO BRONCHOSCOPY (N/A) TRANSHIATIAL TOTAL ESOPHAGECTOMY COMPLETE; CERVICAL ESOPHAGOGASTROSTOMY AND PYLOROPLASTY (N/A) FEEDING JEJUNOSTOMY (N/A) CHEST TUBE INSERTION (Left) Subjective: Remains tachy, tolerating clears  Objective: Vital signs in last 24 hours: Temp:  [98.8 F (37.1 C)-99 F (37.2 C)] 98.8 F (37.1 C) (10/11 0544) Pulse Rate:  [109-121] 112 (10/11 0544) Cardiac Rhythm: Sinus tachycardia (10/10 1900) Resp:  [17-20] 20 (10/11 0544) BP: (114-131)/(58-90) 131/67 (10/11 0544) SpO2:  [97 %-98 %] 98 % (10/11 0544)  Hemodynamic parameters for last 24 hours:    Intake/Output from previous day: 10/10 0701 - 10/11 0700 In: 340 [P.O.:340] Out: 851 [Urine:850; Stool:1] Intake/Output this shift: No intake/output data recorded.  General appearance: alert, cooperative and no distress Heart: regular rate and rhythm and tachy Lungs: dim in bases Abdomen: benign Extremities: no edema Wound: incis healing well, more drainage drom nexk incision though  Lab Results:  Recent Labs  04/30/16 0425  WBC 9.4  HGB 11.9*  HCT 36.1*  PLT 263   BMET:  Recent Labs  04/30/16 0425  NA 136  K 3.7  CL 103  CO2 24  GLUCOSE 153*  BUN 16  CREATININE 0.45*  CALCIUM 8.8*    PT/INR: No results for input(s): LABPROT, INR in the last 72 hours. ABG    Component Value Date/Time   PHART 7.387 04/24/2016 0334   HCO3 22.4 04/24/2016 0334   TCO2 24 04/24/2016 0334   ACIDBASEDEF 2.0 04/24/2016 0334   O2SAT 97.0 04/24/2016 0334   CBG (last 3)  No results for input(s): GLUCAP in the last 72 hours.  Meds Scheduled Meds: . chlorhexidine  15 mL Mouth Rinse BID  . enoxaparin (LOVENOX) injection  30 mg Subcutaneous Q24H  . famotidine (PEPCID) IV  20 mg Intravenous Q12H  . fluticasone  1 spray Each Nare Daily  . mouth rinse  15 mL Mouth Rinse q12n4p    . metoCLOPramide (REGLAN) injection  10 mg Intravenous Q8H  . metoprolol tartrate  25 mg Per J Tube TID   Continuous Infusions: . dextrose 5 % and 0.45 % NaCl with KCl 20 mEq/L 10 mL/hr at 05/01/16 1509  . feeding supplement (VITAL AF 1.2 CAL) 1,000 mL (05/02/16 0405)   PRN Meds:.oxyCODONE **AND** acetaminophen, bisacodyl, ondansetron (ZOFRAN) IV, potassium chloride  Xrays Dg Chest 2 View  Result Date: 04/30/2016 CLINICAL DATA:  Esophageal carcinoma, status post gastric swing through procedure EXAM: CHEST  2 VIEW COMPARISON:  April 28, 2016 FINDINGS: Contrast remains area of gastric swing through procedure with very little contrast seen in the small bowel. There is a moderate pleural effusion on the left with moderate pneumothorax on the left. No contrast is seen within this pleural effusion. The lungs elsewhere are clear. Heart size and pulmonary vascularity normal. No adenopathy. Central catheter tip is in superior vena cava. There is anterior wedging of the L1 vertebral body. IMPRESSION: Moderate hydropneumothorax on the left without tension component. No contrast extravasation into the fluid. Lungs elsewhere clear. Cardiac silhouette within normal limits. Critical Value/emergent results were called by telephone at the time of interpretation on 04/30/2016 at 8:34 am to Dr. Lanelle Bal , who verbally acknowledged these results. Electronically Signed   By: Lowella Grip III M.D.   On: 04/30/2016 08:35   Dg Esophagus W/water  Sol Cm  Result Date: 04/30/2016 CLINICAL DATA:  Postop esophagectomy and gastric pull-through for esophageal cancer EXAM: ESOPHOGRAM/BARIUM SWALLOW TECHNIQUE: Single contrast examination was performed using  water-soluble. FLUOROSCOPY TIME:  Fluoroscopy Time:  1 minutes 30 seconds. Radiation Exposure Index (if provided by the fluoroscopic device): Number of Acquired Spot Images: 6 COMPARISON:  None. FINDINGS: A limited examination was performed in the semi recumbent  supine position. There are postoperative changes of esophagectomy and gastric pull-through. Water-soluble contrast opacifies the gastric pull-through and proximal small bowel. No leak. Compression deformity in the lumbar spine is incidentally noted. IMPRESSION: Postoperative changes of esophagectomy and gastric pull-through without complicating feature. Electronically Signed   By: Lorin Picket M.D.   On: 04/30/2016 08:36    Assessment/Plan: S/P Procedure(s) (LRB): VIDEO BRONCHOSCOPY (N/A) TRANSHIATIAL TOTAL ESOPHAGECTOMY COMPLETE; CERVICAL ESOPHAGOGASTROSTOMY AND PYLOROPLASTY (N/A) FEEDING JEJUNOSTOMY (N/A) CHEST TUBE INSERTION (Left)  1 doing well overall 2 increase in neck incis drainage- no cellulitis or fevers([id number])  monitor closely 3 persistent tachy- could consider PE workup   LOS: 9 days    Fred [name] 05/02/2016   Neck drain examined , minimal drainage , no color of red juice that he is drinking  I have seen and examined Fred [name] and agree with the above assessment  and plan.  Fred Isaac MD Beeper [phone] Office [phone] 05/02/2016 6:24 PM

## 2016-05-03 LAB — TSH: TSH: 2.422 u[IU]/mL (ref 0.350–4.500)

## 2016-05-03 LAB — BRAIN NATRIURETIC PEPTIDE: B Natriuretic Peptide: 13.6 pg/mL (ref 0.0–100.0)

## 2016-05-03 MED ORDER — OSMOLITE 1.5 CAL PO LIQD
1000.0000 mL | ORAL | Status: DC
  Administered 2016-05-03 – 2016-05-04 (×2): 1000 mL
  Filled 2016-05-03 (×4): qty 1000

## 2016-05-03 NOTE — Progress Notes (Signed)
   05/03/16 0930  Mobility  Assistive Device Other (Comment) (IV pole)  Distance Ambulated (ft) 300 ft (all the way down the main hall and back)   Pt ambulated in hallway. Tolerated fair. Returned to bed. Call bell and phone within reach. Will continue to monitor.

## 2016-05-03 NOTE — Progress Notes (Addendum)
      [address]Suite [address]       [place], [address]             [phone]      10 Days Post-Op Procedure(s) (LRB): VIDEO BRONCHOSCOPY (N/A) TRANSHIATIAL TOTAL ESOPHAGECTOMY COMPLETE; CERVICAL ESOPHAGOGASTROSTOMY AND PYLOROPLASTY (N/A) FEEDING JEJUNOSTOMY (N/A) CHEST TUBE INSERTION (Left) Subjective: Tolerating diet well, tachycardia is improved  Objective: Vital signs in last 24 hours: Temp:  [97.9 F (36.6 C)-98.3 F (36.8 C)] 98.1 F (36.7 C) (10/12 0354) Pulse Rate:  [105-124] 105 (10/12 0354) Cardiac Rhythm: Sinus tachycardia (10/11 1900) Resp:  [17-18] 17 (10/12 0354) BP: (116-124)/(58-63) 121/58 (10/12 0354) SpO2:  [96 %-99 %] 97 % (10/12 0354)  Hemodynamic parameters for last 24 hours:    Intake/Output from previous day: 10/11 0701 - 10/12 0700 In: 960 [P.O.:960] Out: 475 [Urine:475] Intake/Output this shift: No intake/output data recorded.  General appearance: alert, cooperative and no distress Heart: regular rate and rhythm and tachy Lungs: dim left base Abdomen: benign exam, + BS, min incis tenderness Extremities: no edema Wound: scant neck drainage, no cellulitis, drain advanced  Lab Results: No results for input(s): WBC, HGB, HCT, PLT in the last 72 hours. BMET: No results for input(s): NA, K, CL, CO2, GLUCOSE, BUN, CREATININE, CALCIUM in the last 72 hours.  PT/INR: No results for input(s): LABPROT, INR in the last 72 hours. ABG    Component Value Date/Time   PHART 7.387 04/24/2016 0334   HCO3 22.4 04/24/2016 0334   TCO2 24 04/24/2016 0334   ACIDBASEDEF 2.0 04/24/2016 0334   O2SAT 97.0 04/24/2016 0334   CBG (last 3)  No results for input(s): GLUCAP in the last 72 hours.  Meds Scheduled Meds: . chlorhexidine  15 mL Mouth Rinse BID  . enoxaparin (LOVENOX) injection  30 mg Subcutaneous Q24H  . famotidine (PEPCID) IV  20 mg Intravenous Q12H  . fluticasone  1 spray Each Nare Daily  . mouth rinse  15 mL Mouth Rinse q12n4p  .  metoCLOPramide (REGLAN) injection  10 mg Intravenous Q8H  . metoprolol tartrate  25 mg Per J Tube TID   Continuous Infusions: . dextrose 5 % and 0.45 % NaCl with KCl 20 mEq/L 10 mL/hr at 05/01/16 1509  . feeding supplement (VITAL AF 1.2 CAL) 1,000 mL (05/03/16 0433)   PRN Meds:.oxyCODONE **AND** acetaminophen, bisacodyl, ondansetron (ZOFRAN) IV, potassium chloride  Xrays No results found.  Assessment/Plan: S/P Procedure(s) (LRB): VIDEO BRONCHOSCOPY (N/A) TRANSHIATIAL TOTAL ESOPHAGECTOMY COMPLETE; CERVICAL ESOPHAGOGASTROSTOMY AND PYLOROPLASTY (N/A) FEEDING JEJUNOSTOMY (N/A) CHEST TUBE INSERTION (Left)  1 improving daily 2 probably can continue to advance diet, will d/w MD 3 tachycardia is improving, T4 slightly elevated, TSH normal 4 BNP not elevated 5 cont routine rehab  LOS: 10 days    Fred [name] 05/03/2016  Advancing diet and drain today , poss home 1-2 days on night time tube feeding He has Atenolol at home so will plan home on meds he has rather then toprolxl I have seen and examined Fred [name] and agree with the above assessment  and plan.  [name] MD Beeper [phone] Office [phone] 05/03/2016 8:28 AM

## 2016-05-03 NOTE — Progress Notes (Signed)
Patient Name: Fred [name] Date of Encounter: 05/03/2016  Hospital Problem List     Active Problems:   HTN (hypertension)   Esophageal cancer (HCC)   Malnutrition of moderate degree   Sinus tachycardia    Patient Profile    Fred [name] is a 75 y.o. male with history of HTN, GERD, esophageal cancer who presented to [facility] for planned esophageal surgery. He has undergone chemotherapy with carboplatin and Taxol along with concurrent radiation. On 04/27/16 he underwent bronchoscopy, transhiatal total esophagectomy with cervical esophagogastrostomy, pyloroplasty, feeding jejunostomy and left chest tube.  We were called to evaluate tachycardia.  Subjective   Eating some.  Denies chest pain or SOB.   Inpatient Medications    . chlorhexidine  15 mL Mouth Rinse BID  . enoxaparin (LOVENOX) injection  30 mg Subcutaneous Q24H  . fluticasone  1 spray Each Nare Daily  . mouth rinse  15 mL Mouth Rinse q12n4p  . metoprolol tartrate  25 mg Per J Tube TID    Vital Signs    Vitals:   05/02/16 2240 05/03/16 0354 05/03/16 0742 05/03/16 0934  BP: 124/62 (!) 121/58  106/60  Pulse: (!) 113 (!) 105 (!) 124 99  Resp:  17  18  Temp:  98.1 F (36.7 C)  97.8 F (36.6 C)  TempSrc:  Oral  Oral  SpO2:  97%    Weight:      Height:        Intake/Output Summary (Last 24 hours) at 05/03/16 1040 Last data filed at 05/03/16 0725  Gross per 24 hour  Intake              840 ml  Output              475 ml  Net              365 ml   Filed Weights   04/28/16 0535 04/29/16 0700 04/30/16 0359  Weight: 122 lb 12.8 oz (55.7 kg) 121 lb 12.8 oz (55.2 kg) 121 lb 6.4 oz (55.1 kg)    Physical Exam    GEN: Well nourished, well developed, in  no acute distress.  Neck: Supple, no JVD, carotid bruits, or masses. Cardiac: RRR, no rubs, or gallops. No clubbing, cyanosis, no edema.  Radials/DP/PT 2+ and equal bilaterally.  Respiratory:  Respirations  regular and unlabored, clear to auscultation  bilaterally. GI: Soft, nontender, nondistended, BS + x 4. Neuro:  Strength and sensation are intact.   Labs    CBC No results for input(s): WBC, NEUTROABS, HGB, HCT, MCV, PLT in the last 72 hours. Basic Metabolic Panel No results for input(s): NA, K, CL, CO2, GLUCOSE, BUN, CREATININE, CALCIUM, MG, PHOS in the last 72 hours. Liver Function Tests No results for input(s): AST, ALT, ALKPHOS, BILITOT, PROT, ALBUMIN in the last 72 hours. No results for input(s): LIPASE, AMYLASE in the last 72 hours. Cardiac Enzymes No results for input(s): CKTOTAL, CKMB, CKMBINDEX, TROPONINI in the last 72 hours. BNP Invalid input(s): POCBNP D-Dimer No results for input(s): DDIMER in the last 72 hours. Hemoglobin A1C No results for input(s): HGBA1C in the last 72 hours. Fasting Lipid Panel No results for input(s): CHOL, HDL, LDLCALC, TRIG, CHOLHDL, LDLDIRECT in the last 72 hours. Thyroid Function Tests  Recent Labs  05/03/16 0256  TSH 2.422    Telemetry    NSR, sinus tach  ECG    NA  Radiology    Dg Chest 2 View  Result  Date: 04/30/2016 CLINICAL DATA:  Esophageal carcinoma, status post gastric swing through procedure EXAM: CHEST  2 VIEW COMPARISON:  April 28, 2016 FINDINGS: Contrast remains area of gastric swing through procedure with very little contrast seen in the small bowel. There is a moderate pleural effusion on the left with moderate pneumothorax on the left. No contrast is seen within this pleural effusion. The lungs elsewhere are clear. Heart size and pulmonary vascularity normal. No adenopathy. Central catheter tip is in superior vena cava. There is anterior wedging of the L1 vertebral body. IMPRESSION: Moderate hydropneumothorax on the left without tension component. No contrast extravasation into the fluid. Lungs elsewhere clear. Cardiac silhouette within normal limits. Critical Value/emergent results were called by telephone at the time of interpretation on 04/30/2016 at 8:34 am to  Dr. Lanelle Bal , who verbally acknowledged these results. Electronically Signed   By: Lowella Grip III M.D.   On: 04/30/2016 08:35   Dg Chest 2 View  Result Date: 04/28/2016 CLINICAL DATA:  Esophageal carcinoma with recent esophagectomy EXAM: CHEST  2 VIEW COMPARISON:  April 27, 2016 FINDINGS: Pneumothorax on the left remains stable. No tension component. There is consolidation in the left base. There is atelectatic change in the right base with small pleural effusions noted bilaterally. There is no pulmonary edema evident currently. Heart is upper normal in size with pulmonary vascularity within normal limits. No adenopathy evident. Central catheter tip is in the superior vena cava. IMPRESSION: Stable pneumothorax on the left without progression or tension component. Persistent small pleural effusions with left base consolidation and mild right base atelectasis. Stable cardiac prominence. No new opacity. Electronically Signed   By: Lowella Grip III M.D.   On: 04/28/2016 11:03   Dg Chest 2 View  Result Date: 04/20/2016 CLINICAL DATA:  Esophageal carcinoma EXAM: CHEST  2 VIEW COMPARISON:  Chest CT March 20, 2016 FINDINGS: Lungs are clear. Heart size and pulmonary vascularity are normal. No adenopathy evident. There is degenerative change in the thoracic spine. No blastic or lytic bone lesions are evident. IMPRESSION: No edema or consolidation. No adenopathy. No esophageal wall thickening is evident by radiography. Electronically Signed   By: Lowella Grip III M.D.   On: 04/20/2016 09:37   Mr Liver W Wo Contrast  Result Date: 04/10/2016 CLINICAL DATA:  Esophageal cancer, possible liver lesion on CT EXAM: MRI ABDOMEN WITHOUT AND WITH CONTRAST TECHNIQUE: Multiplanar multisequence MR imaging of the abdomen was performed both before and after the administration of intravenous contrast. CONTRAST:  81mL MULTIHANCE GADOBENATE DIMEGLUMINE 529 MG/ML IV SOLN COMPARISON:  CT abdomen/pelvis dated  03/20/2016 FINDINGS: Lower chest: Lung bases are clear. Hepatobiliary: Scattered benign hepatic cysts measuring up to 6 mm in the right hepatic lobe (series 8/image 6). 8 mm lesion in segment 7 (series 8/image 9), with suspected peripheral nodular discontinuous enhancement (series 12/image 22), suggesting a benign hemangioma. Additional 10 mm T2 hyperintense lesion in segment 6 (series 8/image 13), with associated perfusion anomaly on the early arterial phase (series 12/image 33), and persistent central enhancement (series 19/image 35), possibly reflecting a hemangioma or vascular malformation. Metastatic disease is considered unlikely but is not entirely excluded. Gallbladder is unremarkable. No intrahepatic or extrahepatic ductal dilatation. Pancreas: 8 mm nonaggressive pancreatic cyst along the pancreatic tail (series 8/image 15). No pancreatic atrophy or ductal dilatation. Spleen:  Within normal limits. Adrenals/Urinary Tract: 12 mm left adrenal nodule (series 8/ image 15), unchanged. No definite intracellular lipid to suggest a benign adenoma. Right adrenal gland is within  normal limits. Scattered bilateral renal cysts, including a 4.4 cm left upper pole renal cyst (series 8/ image 14) and a 5.9 cm right lower pole renal cyst (series 8/ image 26). No hydronephrosis. Stomach/Bowel: Stomach is notable for a tiny hiatal hernia. Visualized bowel is unremarkable. Vascular/Lymphatic:  No evidence of abdominal aortic aneurysm. No suspicious abdominal lymphadenopathy. Other:  No abdominal ascites. Musculoskeletal: No focal osseous lesions. IMPRESSION: 10 mm enhancing lesion in segment 6 favors a vascular malformation or hemangioma, with metastatic disease considered unlikely. Consider follow CT or MR in 3-6 months, as clinically warranted 8 mm lesion in segment 7 is compatible with a benign hemangioma. Additional scattered hepatic cysts. 12 mm left adrenal nodule, unchanged, indeterminate. Metastasis is not excluded.  **An incidental finding of potential clinical significance has been found. 8 mm nonaggressive pancreatic cyst along the pancreatic tail. Follow-up CT or MR abdomen with/without contrast is suggested in 1 year.** Electronically Signed   By: Julian Hy M.D.   On: 04/10/2016 16:23   Dg Chest Port 1 View  Result Date: 04/27/2016 CLINICAL DATA:  Status post esophagectomy with jejunal interposition EXAM: PORTABLE CHEST 1 VIEW COMPARISON:  Portable chest x-ray of April 26, 2016 FINDINGS: There has been interval removal of the left chest tube with development of an approximately 15-20% left-sided pneumothorax. There is persistent left basilar atelectasis. The interstitial markings on the right remain increased and are slightly more prominent today. The cardiac silhouette remains enlarged. The pulmonary vascularity is mildly prominent and indistinct. There is no significant pleural effusion. The right internal jugular venous catheter tip projects over the midportion of the SVC. The esophagogastric tube has been removed. IMPRESSION: Interval development of a 15-20% left-sided pneumothorax since removal of the left chest tube. Persistent left lower lobe atelectasis. Slight increased prominence of the pulmonary interstitium on the right. Stable cardiomegaly. These results were called by telephone at the time of interpretation on 04/27/2016 at 7:41 am to [facility], RN, , who verbally acknowledged these results. No active disease. Electronically Signed   By: David  Martinique M.D.   On: 04/27/2016 07:45   Dg Chest Port 1 View  Result Date: 04/26/2016 CLINICAL DATA:  Chest pain EXAM: PORTABLE CHEST 1 VIEW COMPARISON:  04/25/2016 FINDINGS: Nasogastric catheter is noted within the distal esophagus stable from the prior exam. Right jugular central line is again noted and stable. Left-sided thoracostomy catheter is seen. The previously noted pneumothorax is less well appreciated. Bibasilar atelectatic changes and  small pleural effusions are again seen. No new focal abnormality is noted. IMPRESSION: Resolution of previously seen apical pneumothorax. Persistent bibasilar atelectasis and pleural effusions. Electronically Signed   By: Inez Catalina M.D.   On: 04/26/2016 07:51   Dg Chest Port 1 View  Result Date: 04/25/2016 CLINICAL DATA:  Chest tube treatment for left pneumothorax. Status post esophagectomy for malignancy EXAM: PORTABLE CHEST 1 VIEW COMPARISON:  Portable chest x-ray of April 24, 2016 FINDINGS: The lungs are mildly hypo inflated. A small apical pneumothorax on the left is stable the left chest tube tip projects over the posterior aspect of the left fifth rib. There is bibasilar atelectasis greatest on the left. There is no significant pleural effusion. The heart is normal in size. There is calcification in the wall of the aortic arch. The esophagogastric tube is in stable position with the proximal port overlying the distal third of the reconstructed esophagus with the tip at the expected location of the former GE junction. The tip of the right internal  jugular venous catheter projects over the midportion of the SVC. IMPRESSION: 1. Stable less than 5% left apical pneumothorax. Stable positioning of the left chest tube. Persistent left lower lobe atelectasis with minimal atelectasis on the right. No significant pleural effusion. 2. Mild cardiomegaly without pulmonary vascular congestion. Aortic atherosclerosis. 3. The esophagogastric tube is in stable position as described. Electronically Signed   By: David  Martinique M.D.   On: 04/25/2016 07:29   Dg Chest Port 1 View  Result Date: 04/24/2016 CLINICAL DATA:  75 year old male with repositioning of nasogastric tube. Subsequent encounter. EXAM: PORTABLE CHEST 1 VIEW COMPARISON:  04/24/2016. FINDINGS: Post esophagectomy with gastric pull-up. Nasogastric tube tip at the level of the left hemidiaphragm. Side hole 3 cm below carina level. Consolidation medial aspect  lung bases bilaterally may represent atelectasis related to surgery. Basal infiltrates and small left pleural effusion not excluded. Pulmonary vascular congestion. Right central line tip distal superior vena cava level. Left-sided chest tube is in place. There may be a tiny left apical pneumothorax. Cardiomegaly. IMPRESSION: Post esophagectomy with gastric pull-up. Nasogastric tube tip at the level of the left hemidiaphragm. Side hole 3 cm below carina level. Consolidation medial aspect lung bases bilaterally may represent atelectasis related to surgery. Basal infiltrates and small left pleural effusion not excluded. Pulmonary vascular congestion. Left-sided chest tube is in place. There may be a tiny left apical pneumothorax. Electronically Signed   By: Genia Del M.D.   On: 04/24/2016 07:55   Dg Chest Port 1 View  Result Date: 04/24/2016 CLINICAL DATA:  Esophageal cancer.  Follow-up chest tube. EXAM: PORTABLE CHEST 1 VIEW COMPARISON:  04/23/2016 FINDINGS: Nasogastric tube has its tip at the gastroesophageal junction. Right internal jugular central line has its tip in the SVC 2 cm above the right atrium. Endotracheal tube has been removed. Left chest tube remains in place. No visible pneumothorax. Volume loss in both lower lobes persists, left worse than right. IMPRESSION: Extubated. No pneumothorax. Nasogastric tube tip at the gastroesophageal junction. Persistent lower lobe atelectasis left worse than right. Electronically Signed   By: Nelson Chimes M.D.   On: 04/24/2016 07:39   Dg Chest Port 1 View  Result Date: 04/24/2016 CLINICAL DATA:  Esophageal cancer.  Hypertension.  GERD. EXAM: PORTABLE CHEST 1 VIEW COMPARISON:  04/23/2016.  04/20/2016.  CT 03/20/2016. FINDINGS: NG tube tip noted at the gastroesophageal junction. Advancement of 12 cm should be considered if it is desired that the tip of the endotracheal tube should be in the stomach. Right IJ line and left chest tube in stable position. No  pneumothorax. Cardiomegaly with mild pulmonary venous congestion. Mild bibasilar atelectasis and/or infiltrates/edema noted. Small left pleural effusion cannot be excluded. IMPRESSION: 1. NG tube tip noted at the gastroesophageal junction. Advancement of approximately 12 cm should be considered if desired for the tip and side hole of the endotracheal tube should be in the stomach in this patient with known esophageal cancer. 2. Right IJ line and left chest tube in stable position. No pneumothorax. 3. Cardiomegaly with pulmonary venous congestion. Mild bibasilar atelectasis and/or infiltrates/edema noted. Small left pleural effusion. A component of congestive heart failure may be present . These results will be called to the ordering clinician or representative by the Radiologist Assistant, and communication documented in the PACS or zVision Dashboard. Electronically Signed   By: Marcello Moores  Register   On: 04/24/2016 07:22   Dg Chest Port 1 View  Result Date: 04/23/2016 CLINICAL DATA:  Status post esophagectomy. EXAM: PORTABLE CHEST  1 VIEW COMPARISON:  PET-CT scan 12/05/2015, chest radiograph 04/20/2016 FINDINGS: NG to extends to the level of the diaphragm centrally. Endotracheal to 4.3 cm from carina. RIGHT central venous line noted. No pneumothorax. LEFT basilar atelectasis. LEFT chest tube in place. Small LEFT apical pneumothorax. IMPRESSION: 1. Small LEFT apical pneumothorax with chest tube in place. 2. NG tube, endotracheal tube and RIGHT central venous line in good position. 3. LEFT lower lobe atelectasis. Electronically Signed   By: Suzy Bouchard M.D.   On: 04/23/2016 15:59   Dg Esophagus W/water Sol Cm  Result Date: 04/30/2016 CLINICAL DATA:  Postop esophagectomy and gastric pull-through for esophageal cancer EXAM: ESOPHOGRAM/BARIUM SWALLOW TECHNIQUE: Single contrast examination was performed using  water-soluble. FLUOROSCOPY TIME:  Fluoroscopy Time:  1 minutes 30 seconds. Radiation Exposure Index (if  provided by the fluoroscopic device): Number of Acquired Spot Images: 6 COMPARISON:  None. FINDINGS: A limited examination was performed in the semi recumbent supine position. There are postoperative changes of esophagectomy and gastric pull-through. Water-soluble contrast opacifies the gastric pull-through and proximal small bowel. No leak. Compression deformity in the lumbar spine is incidentally noted. IMPRESSION: Postoperative changes of esophagectomy and gastric pull-through without complicating feature. Electronically Signed   By: Lorin Picket M.D.   On: 04/30/2016 08:36    Assessment & Plan    SINUS TACHYCARDIA:  BNP normal.  Thyroid OK.   No change in therapy.  Continue current therapy and I will change to PO beta blocker when he is taking pills by mouth.    Signed, Minus Breeding, MD  05/03/2016, 10:40 AM

## 2016-05-03 NOTE — Progress Notes (Signed)
Nutrition Follow-up  DOCUMENTATION CODES:   Non-severe (moderate) malnutrition in context of chronic illness  INTERVENTION:  Recommend changing to home TF formula. Provide Osmolite 1.5 @ 60 ml/hr to provide 2160 kcal, 90 grams of protein and 1094 ml of water. Provide 30 ml free water flush every 4 hours to prevent tube from clogging.    Once home, recommend providing nocturnal feeds to meet ~75% of energy and protein needs. Provide Osmolite 1.5 @ 78 ml/hr for 14 hours (1900 hr to 0900 hr) to provide 1638 kcal, 68 grams of protein and 830 ml of fluid   NUTRITION DIAGNOSIS:   Inadequate oral intake related to inability to eat (s/p esophagectomy) as evidenced by NPO status.  Ongoing/progressing- now on soft diet (minimal PO)  GOAL:   Patient will meet greater than or equal to 90% of their needs  Being met  MONITOR:   TF tolerance, PO intake, Labs, Weight trends, Skin  REASON FOR ASSESSMENT:   Consult Enteral/tube feeding initiation and management  ASSESSMENT:   75 y.o. Male seen in the office at his request for a second surgical opinion concerning his diagnosed adenocarcinoma the distal esophagus. The patient gives a history of many years of reflux symptoms taking acid blocking medications. For 3 months he had noted increasing difficulty swallowing specially solid food and bread. The patient completed radiation and chemotherapy at [facility].  Pt was advanced to a soft diet this morning. He reports taking it slow and starting with small amounts; he ate 25% of one pancake, some grits, and eggs this morning and ate some chicken noodles soup and 25% of a dinner roll at lunch. Pt continues to receive Vital AF 1.2 @ 70 ml/hr via J-tube.  RD discussed tube feeding options. Pt would like to transition to his home FT formula, Osmolite 1.5, but would like to stay on continuous feeds for now until PO intake improves and transition to nocturnal feeds at home. He denies any nausea or  abdominal pain with PO intake; one episode of diarrhea after drinking juice.   Labs: low hemoglobin  Diet Order:  DIET SOFT Room service appropriate? Yes; Fluid consistency: Thin  Skin:  Wound (see comment) (closed incisions on neck and abdomen)  Last BM:  10/11  Height:   Ht Readings from Last 1 Encounters:  04/23/16 5' 5"  (1.651 m)    Weight:   Wt Readings from Last 1 Encounters:  04/30/16 121 lb 6.4 oz (55.1 kg)    Ideal Body Weight:  62 kg  BMI:  Body mass index is 20.2 kg/m.  Estimated Nutritional Needs:   Kcal:  2000-2200  Protein:  85-100 grams  Fluid:  2-2.2 L/day  EDUCATION NEEDS:   No education needs identified at this time  [place], CSP, LDN Inpatient Clinical Dietitian Pager: [phone] After Hours Pager: [phone]

## 2016-05-04 ENCOUNTER — Inpatient Hospital Stay (HOSPITAL_COMMUNITY): Payer: Non-veteran care

## 2016-05-04 LAB — CBC
HCT: 35.3 % — ABNORMAL LOW (ref 39.0–52.0)
Hemoglobin: 11.6 g/dL — ABNORMAL LOW (ref 13.0–17.0)
MCH: 29.8 pg (ref 26.0–34.0)
MCHC: 32.9 g/dL (ref 30.0–36.0)
MCV: 90.7 fL (ref 78.0–100.0)
Platelets: 316 10*3/uL (ref 150–400)
RBC: 3.89 MIL/uL — ABNORMAL LOW (ref 4.22–5.81)
RDW: 13.3 % (ref 11.5–15.5)
WBC: 9.1 10*3/uL (ref 4.0–10.5)

## 2016-05-04 LAB — BASIC METABOLIC PANEL
Anion gap: 8 (ref 5–15)
BUN: 14 mg/dL (ref 6–20)
CO2: 27 mmol/L (ref 22–32)
Calcium: 9 mg/dL (ref 8.9–10.3)
Chloride: 102 mmol/L (ref 101–111)
Creatinine, Ser: 0.49 mg/dL — ABNORMAL LOW (ref 0.61–1.24)
GFR calc Af Amer: >60 mL/min (ref >60)
GFR calc non Af Amer: >60 mL/min (ref >60)
Glucose, Bld: 126 mg/dL — ABNORMAL HIGH (ref 65–99)
Potassium: 4.3 mmol/L (ref 3.5–5.1)
Sodium: 137 mmol/L (ref 135–145)

## 2016-05-04 MED ORDER — OXYCODONE HCL 5 MG/5ML PO SOLN: 5.0000 mg | Freq: Four times a day (QID) | ORAL | 0 refills | Status: DC | PRN

## 2016-05-04 MED ORDER — ATENOLOL 25 MG PO TABS: 25.0000 mg | ORAL_TABLET | Freq: Two times a day (BID) | ORAL | 1 refills | Status: DC

## 2016-05-04 MED ORDER — ACETAMINOPHEN 160 MG/5ML PO SOLN: 325.0000 mg | ORAL | 0 refills | Status: DC | PRN

## 2016-05-04 MED ORDER — OSMOLITE 1.5 CAL PO LIQD: ORAL | 0 refills | Status: DC

## 2016-05-04 MED ORDER — METOPROLOL SUCCINATE ER 100 MG PO TB24
100.0000 mg | ORAL_TABLET | Freq: Every day | ORAL | Status: DC
  Administered 2016-05-04: 100 mg via ORAL
  Filled 2016-05-04: qty 1

## 2016-05-04 NOTE — Progress Notes (Signed)
Removed patients sutures and staples. Patient tolerated procedure well.  Janesha Brissette, RN

## 2016-05-04 NOTE — Progress Notes (Signed)
      [address]       [place], [address]             [phone]      11 Days Post-Op Procedure(s) (LRB): VIDEO BRONCHOSCOPY (N/A) TRANSHIATIAL TOTAL ESOPHAGECTOMY COMPLETE; CERVICAL ESOPHAGOGASTROSTOMY AND PYLOROPLASTY (N/A) FEEDING JEJUNOSTOMY (N/A) CHEST TUBE INSERTION (Left) Subjective: conts to feel well, eating solid food this am  Objective: Vital signs in last 24 hours: Temp:  [97.8 F (36.6 C)-98.3 F (36.8 C)] 97.9 F (36.6 C) (10/13 0518) Pulse Rate:  [99-117] 106 (10/13 0518) Cardiac Rhythm: Sinus tachycardia (10/12 1900) Resp:  [17-18] 18 (10/13 0518) BP: (106-133)/(59-66) 133/59 (10/13 0518) SpO2:  [95 %-99 %] 99 % (10/13 0518)  Hemodynamic parameters for last 24 hours:    Intake/Output from previous day: 10/12 0701 - 10/13 0700 In: 1525 [P.O.:410; I.V.:240; NG/GT:875] Out: 281 [Urine:280; Stool:1] Intake/Output this shift: No intake/output data recorded.  General appearance: alert, cooperative and no distress Heart: regular rate and rhythm Lungs: mildly dim left base Abdomen: soft, mild incis ttp Extremities: no edema Wound: incis healing well Neck dressing CDI  Lab Results:  Recent Labs  05/04/16 0520  WBC 9.1  HGB 11.6*  HCT 35.3*  PLT 316   BMET:  Recent Labs  05/04/16 0520  NA 137  K 4.3  CL 102  CO2 27  GLUCOSE 126*  BUN 14  CREATININE 0.49*  CALCIUM 9.0    PT/INR: No results for input(s): LABPROT, INR in the last 72 hours. ABG    Component Value Date/Time   PHART 7.387 04/24/2016 0334   HCO3 22.4 04/24/2016 0334   TCO2 24 04/24/2016 0334   ACIDBASEDEF 2.0 04/24/2016 0334   O2SAT 97.0 04/24/2016 0334   CBG (last 3)  No results for input(s): GLUCAP in the last 72 hours.  Meds Scheduled Meds: . chlorhexidine  15 mL Mouth Rinse BID  . enoxaparin (LOVENOX) injection  30 mg Subcutaneous Q24H  . fluticasone  1 spray Each Nare Daily  . mouth rinse  15 mL Mouth Rinse q12n4p  . metoprolol tartrate   25 mg Per J Tube TID   Continuous Infusions: . dextrose 5 % and 0.45 % NaCl with KCl 20 mEq/L 10 mL/hr at 05/01/16 1509  . feeding supplement (OSMOLITE 1.5 CAL) 1,000 mL (05/04/16 0600)   PRN Meds:.oxyCODONE **AND** acetaminophen, bisacodyl, ondansetron (ZOFRAN) IV, potassium chloride  Xrays No results found.  Assessment/Plan: S/P Procedure(s) (LRB): VIDEO BRONCHOSCOPY (N/A) TRANSHIATIAL TOTAL ESOPHAGECTOMY COMPLETE; CERVICAL ESOPHAGOGASTROSTOMY AND PYLOROPLASTY (N/A) FEEDING JEJUNOSTOMY (N/A) CHEST TUBE INSERTION (Left)  1 improving overall 2 CXR - pntx is stable in size 3 tol diet well so far 4 remains tachy- hr90's to 130's - cardiology is following and assiting with management 5 poss home today if all agree vs tomorrow- will need to remove neck drain  LOS: 11 days    Laverne Hursey E 05/04/2016

## 2016-05-04 NOTE — Progress Notes (Signed)
Patient discharged: Home with family  Via: Wheelchair   Discharge paperwork given: to patient and family  Reviewed with teach back  IV and telemetry disconnected  Belongings given to patient   Graclynn Vanantwerp, RN  

## 2016-05-04 NOTE — Progress Notes (Signed)
Patient Name: Fred [name] Date of Encounter: 05/04/2016  Hospital Problem List     Active Problems:   HTN (hypertension)   Esophageal cancer (HCC)   Malnutrition of moderate degree   Sinus tachycardia    Patient Profile    Fred [name] is a 75 y.o. male with history of HTN, GERD, esophageal cancer who presented to [facility] for planned esophageal surgery. He has undergone chemotherapy with carboplatin and Taxol along with concurrent radiation. On 04/27/16 he underwent bronchoscopy, transhiatal total esophagectomy with cervical esophagogastrostomy, pyloroplasty, feeding jejunostomy and left chest tube.  We were called to evaluate tachycardia.  Subjective   Eating some.  Denies chest pain or SOB.   Inpatient Medications    . chlorhexidine  15 mL Mouth Rinse BID  . enoxaparin (LOVENOX) injection  30 mg Subcutaneous Q24H  . fluticasone  1 spray Each Nare Daily  . mouth rinse  15 mL Mouth Rinse q12n4p  . metoprolol tartrate  25 mg Per J Tube TID    Vital Signs    Vitals:   05/03/16 1354 05/03/16 2034 05/04/16 0518 05/04/16 0824  BP: 120/66 120/62 (!) 133/59 105/74  Pulse: (!) 116 (!) 112 (!) 106 (!) 120  Resp: 18 18 18    Temp: 98.3 F (36.8 C) 98.3 F (36.8 C) 97.9 F (36.6 C)   TempSrc: Oral Oral Oral   SpO2:  97% 99%   Weight:      Height:        Intake/Output Summary (Last 24 hours) at 05/04/16 1113 Last data filed at 05/04/16 1055  Gross per 24 hour  Intake             1565 ml  Output              331 ml  Net             1234 ml   Filed Weights   04/28/16 0535 04/29/16 0700 04/30/16 0359  Weight: 122 lb 12.8 oz (55.7 kg) 121 lb 12.8 oz (55.2 kg) 121 lb 6.4 oz (55.1 kg)    Physical Exam    GEN: Well nourished, well developed, in  no acute distress.  Neck: Supple, no JVD, carotid bruits, or masses. Cardiac: RRR, no rubs, or gallops. No clubbing, cyanosis, no edema.  Radials/DP/PT 2+ and equal bilaterally.  Respiratory:  Respirations  regular and  unlabored, clear to auscultation bilaterally. GI: Soft, nontender, nondistended, BS + x 4. Neuro:  Strength and sensation are intact.   Labs    CBC  Recent Labs  05/04/16 0520  WBC 9.1  HGB 11.6*  HCT 35.3*  MCV 90.7  PLT 123XX123   Basic Metabolic Panel  Recent Labs  05/04/16 0520  NA 137  K 4.3  CL 102  CO2 27  GLUCOSE 126*  BUN 14  CREATININE 0.49*  CALCIUM 9.0   Liver Function Tests No results for input(s): AST, ALT, ALKPHOS, BILITOT, PROT, ALBUMIN in the last 72 hours. No results for input(s): LIPASE, AMYLASE in the last 72 hours. Cardiac Enzymes No results for input(s): CKTOTAL, CKMB, CKMBINDEX, TROPONINI in the last 72 hours. BNP Invalid input(s): POCBNP D-Dimer No results for input(s): DDIMER in the last 72 hours. Hemoglobin A1C No results for input(s): HGBA1C in the last 72 hours. Fasting Lipid Panel No results for input(s): CHOL, HDL, LDLCALC, TRIG, CHOLHDL, LDLDIRECT in the last 72 hours. Thyroid Function Tests  Recent Labs  05/03/16 0256  TSH 2.422    Telemetry  NSR, sinus tach  ECG    NA  Radiology    Dg Chest 2 View  Result Date: 05/04/2016 CLINICAL DATA:  Shortness of breath, weakness. History of esophageal malignancy status post esophagectomy and gastric pull-through on April 23, 2016. EXAM: CHEST  2 VIEW COMPARISON:  Chest x-ray of April 30, 2016 FINDINGS: The left-sided hydro pneumothorax persists. The volume of pleural fluid may have decreased slightly. There is no mediastinal shift. The right lung is clear. There is persistent left lower lobe atelectasis or pneumonia. There are degenerative changes of the shoulders. The thoracic vertebral bodies are preserved in height. The right lung is well-expanded and clear. On the left a small pleural effusion persists. There is left lower lobe atelectasis. IMPRESSION: Persistent left-sided hydro pneumothorax without mediastinal shift. The mediastinum is not abnormally widened. There is  persistent left lower lobe atelectasis or pneumonia. No CHF. Electronically Signed   By: David  Martinique M.D.   On: 05/04/2016 08:16   Dg Chest 2 View  Result Date: 04/30/2016 CLINICAL DATA:  Esophageal carcinoma, status post gastric swing through procedure EXAM: CHEST  2 VIEW COMPARISON:  April 28, 2016 FINDINGS: Contrast remains area of gastric swing through procedure with very little contrast seen in the small bowel. There is a moderate pleural effusion on the left with moderate pneumothorax on the left. No contrast is seen within this pleural effusion. The lungs elsewhere are clear. Heart size and pulmonary vascularity normal. No adenopathy. Central catheter tip is in superior vena cava. There is anterior wedging of the L1 vertebral body. IMPRESSION: Moderate hydropneumothorax on the left without tension component. No contrast extravasation into the fluid. Lungs elsewhere clear. Cardiac silhouette within normal limits. Critical Value/emergent results were called by telephone at the time of interpretation on 04/30/2016 at 8:34 am to Dr. Lanelle Bal , who verbally acknowledged these results. Electronically Signed   By: Lowella Grip III M.D.   On: 04/30/2016 08:35   Dg Chest 2 View  Result Date: 04/28/2016 CLINICAL DATA:  Esophageal carcinoma with recent esophagectomy EXAM: CHEST  2 VIEW COMPARISON:  April 27, 2016 FINDINGS: Pneumothorax on the left remains stable. No tension component. There is consolidation in the left base. There is atelectatic change in the right base with small pleural effusions noted bilaterally. There is no pulmonary edema evident currently. Heart is upper normal in size with pulmonary vascularity within normal limits. No adenopathy evident. Central catheter tip is in the superior vena cava. IMPRESSION: Stable pneumothorax on the left without progression or tension component. Persistent small pleural effusions with left base consolidation and mild right base atelectasis.  Stable cardiac prominence. No new opacity. Electronically Signed   By: Lowella Grip III M.D.   On: 04/28/2016 11:03   Dg Chest 2 View  Result Date: 04/20/2016 CLINICAL DATA:  Esophageal carcinoma EXAM: CHEST  2 VIEW COMPARISON:  Chest CT March 20, 2016 FINDINGS: Lungs are clear. Heart size and pulmonary vascularity are normal. No adenopathy evident. There is degenerative change in the thoracic spine. No blastic or lytic bone lesions are evident. IMPRESSION: No edema or consolidation. No adenopathy. No esophageal wall thickening is evident by radiography. Electronically Signed   By: Lowella Grip III M.D.   On: 04/20/2016 09:37   Mr Liver W Wo Contrast  Result Date: 04/10/2016 CLINICAL DATA:  Esophageal cancer, possible liver lesion on CT EXAM: MRI ABDOMEN WITHOUT AND WITH CONTRAST TECHNIQUE: Multiplanar multisequence MR imaging of the abdomen was performed both before and after the administration  of intravenous contrast. CONTRAST:  58mL MULTIHANCE GADOBENATE DIMEGLUMINE 529 MG/ML IV SOLN COMPARISON:  CT abdomen/pelvis dated 03/20/2016 FINDINGS: Lower chest: Lung bases are clear. Hepatobiliary: Scattered benign hepatic cysts measuring up to 6 mm in the right hepatic lobe (series 8/image 6). 8 mm lesion in segment 7 (series 8/image 9), with suspected peripheral nodular discontinuous enhancement (series 12/image 22), suggesting a benign hemangioma. Additional 10 mm T2 hyperintense lesion in segment 6 (series 8/image 13), with associated perfusion anomaly on the early arterial phase (series 12/image 33), and persistent central enhancement (series 19/image 35), possibly reflecting a hemangioma or vascular malformation. Metastatic disease is considered unlikely but is not entirely excluded. Gallbladder is unremarkable. No intrahepatic or extrahepatic ductal dilatation. Pancreas: 8 mm nonaggressive pancreatic cyst along the pancreatic tail (series 8/image 15). No pancreatic atrophy or ductal dilatation.  Spleen:  Within normal limits. Adrenals/Urinary Tract: 12 mm left adrenal nodule (series 8/ image 15), unchanged. No definite intracellular lipid to suggest a benign adenoma. Right adrenal gland is within normal limits. Scattered bilateral renal cysts, including a 4.4 cm left upper pole renal cyst (series 8/ image 14) and a 5.9 cm right lower pole renal cyst (series 8/ image 26). No hydronephrosis. Stomach/Bowel: Stomach is notable for a tiny hiatal hernia. Visualized bowel is unremarkable. Vascular/Lymphatic:  No evidence of abdominal aortic aneurysm. No suspicious abdominal lymphadenopathy. Other:  No abdominal ascites. Musculoskeletal: No focal osseous lesions. IMPRESSION: 10 mm enhancing lesion in segment 6 favors a vascular malformation or hemangioma, with metastatic disease considered unlikely. Consider follow CT or MR in 3-6 months, as clinically warranted 8 mm lesion in segment 7 is compatible with a benign hemangioma. Additional scattered hepatic cysts. 12 mm left adrenal nodule, unchanged, indeterminate. Metastasis is not excluded. **An incidental finding of potential clinical significance has been found. 8 mm nonaggressive pancreatic cyst along the pancreatic tail. Follow-up CT or MR abdomen with/without contrast is suggested in 1 year.** Electronically Signed   By: Julian Hy M.D.   On: 04/10/2016 16:23   Dg Chest Port 1 View  Result Date: 04/27/2016 CLINICAL DATA:  Status post esophagectomy with jejunal interposition EXAM: PORTABLE CHEST 1 VIEW COMPARISON:  Portable chest x-ray of April 26, 2016 FINDINGS: There has been interval removal of the left chest tube with development of an approximately 15-20% left-sided pneumothorax. There is persistent left basilar atelectasis. The interstitial markings on the right remain increased and are slightly more prominent today. The cardiac silhouette remains enlarged. The pulmonary vascularity is mildly prominent and indistinct. There is no significant  pleural effusion. The right internal jugular venous catheter tip projects over the midportion of the SVC. The esophagogastric tube has been removed. IMPRESSION: Interval development of a 15-20% left-sided pneumothorax since removal of the left chest tube. Persistent left lower lobe atelectasis. Slight increased prominence of the pulmonary interstitium on the right. Stable cardiomegaly. These results were called by telephone at the time of interpretation on 04/27/2016 at 7:41 am to [facility], RN, , who verbally acknowledged these results. No active disease. Electronically Signed   By: David  Martinique M.D.   On: 04/27/2016 07:45   Dg Chest Port 1 View  Result Date: 04/26/2016 CLINICAL DATA:  Chest pain EXAM: PORTABLE CHEST 1 VIEW COMPARISON:  04/25/2016 FINDINGS: Nasogastric catheter is noted within the distal esophagus stable from the prior exam. Right jugular central line is again noted and stable. Left-sided thoracostomy catheter is seen. The previously noted pneumothorax is less well appreciated. Bibasilar atelectatic changes and small pleural effusions  are again seen. No new focal abnormality is noted. IMPRESSION: Resolution of previously seen apical pneumothorax. Persistent bibasilar atelectasis and pleural effusions. Electronically Signed   By: Inez Catalina M.D.   On: 04/26/2016 07:51   Dg Chest Port 1 View  Result Date: 04/25/2016 CLINICAL DATA:  Chest tube treatment for left pneumothorax. Status post esophagectomy for malignancy EXAM: PORTABLE CHEST 1 VIEW COMPARISON:  Portable chest x-ray of April 24, 2016 FINDINGS: The lungs are mildly hypo inflated. A small apical pneumothorax on the left is stable the left chest tube tip projects over the posterior aspect of the left fifth rib. There is bibasilar atelectasis greatest on the left. There is no significant pleural effusion. The heart is normal in size. There is calcification in the wall of the aortic arch. The esophagogastric tube is in stable  position with the proximal port overlying the distal third of the reconstructed esophagus with the tip at the expected location of the former GE junction. The tip of the right internal jugular venous catheter projects over the midportion of the SVC. IMPRESSION: 1. Stable less than 5% left apical pneumothorax. Stable positioning of the left chest tube. Persistent left lower lobe atelectasis with minimal atelectasis on the right. No significant pleural effusion. 2. Mild cardiomegaly without pulmonary vascular congestion. Aortic atherosclerosis. 3. The esophagogastric tube is in stable position as described. Electronically Signed   By: David  Martinique M.D.   On: 04/25/2016 07:29   Dg Chest Port 1 View  Result Date: 04/24/2016 CLINICAL DATA:  75 year old male with repositioning of nasogastric tube. Subsequent encounter. EXAM: PORTABLE CHEST 1 VIEW COMPARISON:  04/24/2016. FINDINGS: Post esophagectomy with gastric pull-up. Nasogastric tube tip at the level of the left hemidiaphragm. Side hole 3 cm below carina level. Consolidation medial aspect lung bases bilaterally may represent atelectasis related to surgery. Basal infiltrates and small left pleural effusion not excluded. Pulmonary vascular congestion. Right central line tip distal superior vena cava level. Left-sided chest tube is in place. There may be a tiny left apical pneumothorax. Cardiomegaly. IMPRESSION: Post esophagectomy with gastric pull-up. Nasogastric tube tip at the level of the left hemidiaphragm. Side hole 3 cm below carina level. Consolidation medial aspect lung bases bilaterally may represent atelectasis related to surgery. Basal infiltrates and small left pleural effusion not excluded. Pulmonary vascular congestion. Left-sided chest tube is in place. There may be a tiny left apical pneumothorax. Electronically Signed   By: Genia Del M.D.   On: 04/24/2016 07:55   Dg Chest Port 1 View  Result Date: 04/24/2016 CLINICAL DATA:  Esophageal  cancer.  Follow-up chest tube. EXAM: PORTABLE CHEST 1 VIEW COMPARISON:  04/23/2016 FINDINGS: Nasogastric tube has its tip at the gastroesophageal junction. Right internal jugular central line has its tip in the SVC 2 cm above the right atrium. Endotracheal tube has been removed. Left chest tube remains in place. No visible pneumothorax. Volume loss in both lower lobes persists, left worse than right. IMPRESSION: Extubated. No pneumothorax. Nasogastric tube tip at the gastroesophageal junction. Persistent lower lobe atelectasis left worse than right. Electronically Signed   By: Nelson Chimes M.D.   On: 04/24/2016 07:39   Dg Chest Port 1 View  Result Date: 04/24/2016 CLINICAL DATA:  Esophageal cancer.  Hypertension.  GERD. EXAM: PORTABLE CHEST 1 VIEW COMPARISON:  04/23/2016.  04/20/2016.  CT 03/20/2016. FINDINGS: NG tube tip noted at the gastroesophageal junction. Advancement of 12 cm should be considered if it is desired that the tip of the endotracheal tube should  be in the stomach. Right IJ line and left chest tube in stable position. No pneumothorax. Cardiomegaly with mild pulmonary venous congestion. Mild bibasilar atelectasis and/or infiltrates/edema noted. Small left pleural effusion cannot be excluded. IMPRESSION: 1. NG tube tip noted at the gastroesophageal junction. Advancement of approximately 12 cm should be considered if desired for the tip and side hole of the endotracheal tube should be in the stomach in this patient with known esophageal cancer. 2. Right IJ line and left chest tube in stable position. No pneumothorax. 3. Cardiomegaly with pulmonary venous congestion. Mild bibasilar atelectasis and/or infiltrates/edema noted. Small left pleural effusion. A component of congestive heart failure may be present . These results will be called to the ordering clinician or representative by the Radiologist Assistant, and communication documented in the PACS or zVision Dashboard. Electronically Signed   By:  Marcello Moores  Register   On: 04/24/2016 07:22   Dg Chest Port 1 View  Result Date: 04/23/2016 CLINICAL DATA:  Status post esophagectomy. EXAM: PORTABLE CHEST 1 VIEW COMPARISON:  PET-CT scan 12/05/2015, chest radiograph 04/20/2016 FINDINGS: NG to extends to the level of the diaphragm centrally. Endotracheal to 4.3 cm from carina. RIGHT central venous line noted. No pneumothorax. LEFT basilar atelectasis. LEFT chest tube in place. Small LEFT apical pneumothorax. IMPRESSION: 1. Small LEFT apical pneumothorax with chest tube in place. 2. NG tube, endotracheal tube and RIGHT central venous line in good position. 3. LEFT lower lobe atelectasis. Electronically Signed   By: Suzy Bouchard M.D.   On: 04/23/2016 15:59   Dg Esophagus W/water Sol Cm  Result Date: 04/30/2016 CLINICAL DATA:  Postop esophagectomy and gastric pull-through for esophageal cancer EXAM: ESOPHOGRAM/BARIUM SWALLOW TECHNIQUE: Single contrast examination was performed using  water-soluble. FLUOROSCOPY TIME:  Fluoroscopy Time:  1 minutes 30 seconds. Radiation Exposure Index (if provided by the fluoroscopic device): Number of Acquired Spot Images: 6 COMPARISON:  None. FINDINGS: A limited examination was performed in the semi recumbent supine position. There are postoperative changes of esophagectomy and gastric pull-through. Water-soluble contrast opacifies the gastric pull-through and proximal small bowel. No leak. Compression deformity in the lumbar spine is incidentally noted. IMPRESSION: Postoperative changes of esophagectomy and gastric pull-through without complicating feature. Electronically Signed   By: Lorin Picket M.D.   On: 04/30/2016 08:36    Assessment & Plan    SINUS TACHYCARDIA:  BNP normal.  Thyroid OK.   I will change to PO beta blocker.  Looks like he got 75 mg total metoprolol yesterday.  I will give him Toprol XL 100 mg.    Signed, Minus Breeding, MD  05/04/2016, 11:13 AM

## 2016-05-07 ENCOUNTER — Telehealth: Payer: Self-pay | Admitting: Cardiology

## 2016-05-07 NOTE — Telephone Encounter (Signed)
New message     Patient wife calling stating prescription was to be called in - increase heart rate .   Wife wants prescription to [facility] on [address].

## 2016-05-07 NOTE — Telephone Encounter (Signed)
Spoke to patient. Information given to stay with atenolol 25 mg twice a day. Patient verbalized he understood. Patient states he has anew bottle from [facility] Of the medication.Should he pick the medication from [facility]. RN informed patient as long as he the correct dose from the [facility]  , HE DOES NOT NEED TO PICK PRESCRIPTION. PATIENT VERBALIZED UNDERSTANDING.

## 2016-05-07 NOTE — Telephone Encounter (Signed)
Patient called states Dr Percival Spanish was going to added a medication back to regimen.   Review progress note 05/04/16 - Dr Percival Spanish mentions toprol xl 100 mg. RN informed patient will review/confirm  With Dr Percival Spanish and contact patient back . Patient verbalized understanding

## 2016-05-07 NOTE — Telephone Encounter (Signed)
Dr. Servando Snare decided to send the patient home on Atenolol because he had a large supply of this at home.

## 2016-05-16 ENCOUNTER — Telehealth: Payer: Self-pay | Admitting: *Deleted

## 2016-05-16 NOTE — Telephone Encounter (Signed)
Oncology Nurse Navigator Documentation  Oncology Nurse Navigator Flowsheets 05/16/2016  Navigator Location [facility]  Referral date to RadOnc/MedOnc -  Navigator Encounter Type Telephone  Telephone Outgoing Call;Patient Update  Abnormal Finding Date -  Confirmed Diagnosis Date -  Surgery Date 04/23/2016  Treatment Initiated Date -  Patient Visit Type -  Treatment Phase -  Barriers/Navigation Needs Education--asking who will continue to write for his tube feeding orders?  Education Other--informed him that Dr. Burr Medico and Ernestene Kiel, RD will continue to manage his tube feeding needs.  Interventions -  Referrals -  Coordination of Care -  Education Method Verbal  Support Groups/Services -  Specialty Items/DME -  Acuity Level 1  Time Spent with Patient 15  Fred [name] is getting stronger and feels the surgery went well. He is able to swallow soft foods now, but still runs the tube feeding at night.

## 2016-05-22 ENCOUNTER — Other Ambulatory Visit: Payer: Self-pay | Admitting: Cardiothoracic Surgery

## 2016-05-22 DIAGNOSIS — C159 Malignant neoplasm of esophagus, unspecified: Secondary | ICD-10-CM

## 2016-05-23 ENCOUNTER — Ambulatory Visit: Payer: No Typology Code available for payment source

## 2016-05-24 ENCOUNTER — Ambulatory Visit (INDEPENDENT_AMBULATORY_CARE_PROVIDER_SITE_OTHER): Payer: Medicare Other | Admitting: Physician Assistant

## 2016-05-24 ENCOUNTER — Encounter: Payer: Self-pay | Admitting: Physician Assistant

## 2016-05-24 ENCOUNTER — Ambulatory Visit (INDEPENDENT_AMBULATORY_CARE_PROVIDER_SITE_OTHER): Payer: Self-pay | Admitting: Cardiothoracic Surgery

## 2016-05-24 ENCOUNTER — Ambulatory Visit
Admission: RE | Admit: 2016-05-24 | Discharge: 2016-05-24 | Disposition: A | Discharge status: Home / Self Care | Payer: No Typology Code available for payment source | Source: Ambulatory Visit | Attending: Cardiothoracic Surgery | Admitting: Cardiothoracic Surgery

## 2016-05-24 ENCOUNTER — Encounter: Payer: Self-pay | Admitting: Cardiothoracic Surgery

## 2016-05-24 VITALS — BP 114/66 | HR 79 | Resp 18 | Ht 65.0 in | Wt 128.8 lb

## 2016-05-24 VITALS — BP 104/58 | HR 72 | Ht 65.0 in | Wt 128.8 lb

## 2016-05-24 DIAGNOSIS — I10 Essential (primary) hypertension: Secondary | ICD-10-CM | POA: Diagnosis not present

## 2016-05-24 DIAGNOSIS — E441 Mild protein-calorie malnutrition: Secondary | ICD-10-CM

## 2016-05-24 DIAGNOSIS — R000 Tachycardia, unspecified: Secondary | ICD-10-CM

## 2016-05-24 DIAGNOSIS — C159 Malignant neoplasm of esophagus, unspecified: Secondary | ICD-10-CM

## 2016-05-24 MED ORDER — ATENOLOL 25 MG PO TABS: ORAL_TABLET | ORAL | 1 refills | Status: AC

## 2016-05-24 NOTE — Progress Notes (Signed)
[address]       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  SURGEON:  Lanelle Bal, M.D.  Malignant neoplasm of lower third of esophagus [facility])   Staging form: Esophagus - Adenocarcinoma, AJCC 7th Edition   - Clinical stage from 12/15/2015: Stage IIIA (T3, N1, M0) - Signed by Truitt Merle, MD on 12/24/2015   - Pathologic stage from 04/26/2016: yT0, N0, cM0, GX - Signed by [name], MD on 04/27/2016 History of Present Illness:     Patient's making good progress at home, he is now been home for 3 weeks. He notes he has not taken any pain medicine since discharge. He is using tube feedings at night and taking a by mouth diet during the day. He does note that he gets full very quickly. He has some loose stools denies any definite dumping syndrome. He is ambulating well     Past Medical History:  Diagnosis Date  . Esophageal cancer ([place]) 11/23/15   lower 3rd esohagus   . GERD (gastroesophageal reflux disease)   . Hypertension      History  Smoking Status  . Former Smoker  . Packs/day: 1.00  . Years: 10.00  . Quit date: 07/24/1967  Smokeless Tobacco  . Never Used    History  Alcohol Use No     Allergies  Allergen Reactions  . No Known Allergies     Current Outpatient Prescriptions    Medication Sig Dispense Refill  . acetaminophen (TYLENOL) 160 MG/5ML solution Place 10.2 mLs (325 mg total) into feeding tube every 4 (four) hours as needed for moderate pain. 120 mL 0  . atenolol (TENORMIN) 25 MG tablet Take 1 tab in the morning and half tab in the evening 60 tablet 1  . fluticasone (FLONASE) 50 MCG/ACT nasal spray Place 1 spray into both nostrils daily as needed for allergies or rhinitis.    . Nutritional Supplements (FEEDING SUPPLEMENT, OSMOLITE 1.5 CAL,) LIQD Increase Osmolite 1.5 to 70 mL/hr continuously from 6 pm to 6 am  hours via Jejunostomy with 50 mL free water flush every hour. Flush with 120 mL water once daily when changing bag. 1659 mL 0  . oxyCODONE (ROXICODONE) 5 MG/5ML solution 5 mLs (5 mg total) by Per J Tube route every 6 (six) hours as needed for severe pain. 150 mL 0   No current facility-administered medications for this visit.    Facility-Administered Medications Ordered in Other Visits  Medication Dose Route Frequency Provider Last Rate Last Dose  . 0.9 %  sodium chloride infusion   Intravenous Once Truitt Merle, MD           Physical Exam: BP 114/66 (BP Location: Left Arm, Patient Position: Sitting, Cuff Size: Small)   Pulse 79   Resp 18   Ht 5\' 5"  (1.651 m)   Wt 128 lb 12.8 oz (  58.4 kg)   SpO2 96% Comment: RA  BMI 21.43 kg/m   General appearance: alert and cooperative Neurologic: intact Heart: regular rate and rhythm, S1, S2 normal, no murmur, click, rub or gallop Lungs: diminished breath sounds LLL Abdomen: soft, non-tender; bowel sounds normal; no masses,  no organomegaly Extremities: extremities normal, atraumatic, no cyanosis or edema and Homans sign is negative, no sign of DVT Wound: Neck and abdominal incision are well-healed, feeding tube is intact. There is no cervical or supraclavicular adenopathy   Diagnostic Studies & Laboratory data:     Recent Radiology Findings:   Dg Chest 2 View  Result Date: 05/24/2016 CLINICAL DATA:   Status post esophagectomy on April 23, 2026. Follow-up of persistent left-sided hydropneumothorax. EXAM: CHEST  2 VIEW COMPARISON:  PA and lateral chest x-ray dated May 04, 2016 FINDINGS: The pneumothorax is not clearly evident today. The small to moderate size left pleural effusion is stable. There is a stable small right pleural effusion layering posteriorly and laterally. The heart and pulmonary vascularity are normal. The retrocardiac region remains dense. The mediastinum is normal in width. IMPRESSION: Interval resolution of the left-sided pneumothorax. There remains a small to moderate size left pleural effusion with left lower lobe atelectasis, and a small right pleural effusion. Electronically Signed   By: David  Martinique M.D.   On: 05/24/2016 15:22      Recent Lab Findings: Lab Results  Component Value Date   WBC 9.1 05/04/2016   HGB 11.6 (L) 05/04/2016   HCT 35.3 (L) 05/04/2016   PLT 316 05/04/2016   GLUCOSE 126 (H) 05/04/2016   ALT 18 04/28/2016   AST 20 04/28/2016   NA 137 05/04/2016   K 4.3 05/04/2016   CL 102 05/04/2016   CREATININE 0.49 (L) 05/04/2016   BUN 14 05/04/2016   CO2 27 05/04/2016   TSH 2.422 05/03/2016   INR 1.06 04/20/2016   Wt Readings from Last 3 Encounters:  05/24/16 128 lb 12.8 oz (58.4 kg)  05/24/16 128 lb 12.8 oz (58.4 kg)  04/30/16 121 lb 6.4 oz (55.1 kg)      Assessment / Plan:     Patient making good progress postoperatively after total esophagectomy, still using tube feedings at night but is increasing his by mouth diet satisfactorily. He is gaining weight.  We'll continue with the current tube feedings at night and by mouth diet in the day I'll see him back in 2 weeks with a follow-up chest x-ray    [name] MD      [address] [place] [address] Office [phone]   Beeper [phone]  05/24/2016 4:10 PM

## 2016-05-24 NOTE — Progress Notes (Signed)
Cardiology Office Note   Date:  05/24/2016   ID:  Fred [name], DOB 1940-11-04, MRN [id number]  PCP:  [name], MD  Cardiologist:  Dr Mardene Celeste, PA-C   Chief Complaint  Patient presents with  . Hospitalization Follow-up  . Chest Pain    History of Present Illness: Fred [name] is a 75 y.o. male with a history of HTN, GERD, esophageal cancer.   D/c 10/13 after admit for esohapageal surgery, seen by cards for tachycardia, was ST, on BB, TSH ok, BNP ok  Fred [name] presents for post-hospital f/u. He is here today with his wife.   Since d/c from the hospital, [name] has done very well. He is walking daily and gradually increasing his stamina. He is eating a very small amount multiple times per day and is still on tube feeds overnight. He had lost 30 lbs prior to the surgery and has put 3 lbs back on since d/c. He still has some soreness at the incision. He still gets some DOE, but this has improved since d/c. He has no LE edema, orthopnea or PND.   He has a record of his BP/HR since d/c. The HR has generally trended down, yesterday his HR was up, pt not sure why, no sx. His BP has been well-controlled, today it is a little lower than previous.   He was having problems with rapid HR and palpitations in rehab, but these have improved since d/c. His HR has not been nearly as high as it was in the hospital, but it speeds up with little activity.  He has not been using the incentive spirometer much.     Past Medical History:  Diagnosis Date  . Esophageal cancer ([place]) 11/23/15   lower 3rd esohagus   . GERD (gastroesophageal reflux disease)   . Hypertension     Past Surgical History:  Procedure Laterality Date  . CHEST TUBE INSERTION Left 04/23/2016   Procedure: CHEST TUBE INSERTION;  Surgeon: [name], MD;  Location: [facility];  Service: Thoracic;  Laterality: Left;  . COMPLETE ESOPHAGECTOMY N/A 04/23/2016   Procedure: TRANSHIATIAL TOTAL  ESOPHAGECTOMY COMPLETE; CERVICAL ESOPHAGOGASTROSTOMY AND PYLOROPLASTY;  Surgeon: [name], MD;  Location: [facility];  Service: Thoracic;  Laterality: N/A;  . cystoscope  4/12/1   prostate  . ERCP    . INGUINAL HERNIA REPAIR    . JEJUNOSTOMY N/A 04/23/2016   Procedure: FEEDING JEJUNOSTOMY;  Surgeon: [name], MD;  Location: [facility];  Service: Thoracic;  Laterality: N/A;  . LEG SURGERY Left    hole  . PROSTATE BIOPSY  10/05/15  . right shoulder surgery    . VIDEO BRONCHOSCOPY N/A 04/23/2016   Procedure: VIDEO BRONCHOSCOPY;  Surgeon: [name], MD;  Location: [facility] OR;  Service: Thoracic;  Laterality: N/A;    Current Outpatient Prescriptions  Medication Sig Dispense Refill  . acetaminophen (TYLENOL) 160 MG/5ML solution Place 10.2 mLs (325 mg total) into feeding tube every 4 (four) hours as needed for moderate pain. 120 mL 0  . atenolol (TENORMIN) 25 MG tablet Take 1 tablet (25 mg total) by mouth am, 1/2 tab pm 60 tablet 1  . fluticasone (FLONASE) 50 MCG/ACT nasal spray Place 1 spray into both nostrils daily as needed for allergies or rhinitis.    . Nutritional Supplements (FEEDING SUPPLEMENT, OSMOLITE 1.5 CAL,) LIQD Increase Osmolite 1.5 to 70 mL/hr continuously from 6 pm to 6 am  hours via Jejunostomy with  50 mL free water flush every hour. Flush with 120 mL water once daily when changing bag. 1659 mL 0  . oxyCODONE (ROXICODONE) 5 MG/5ML solution 5 mLs (5 mg total) by Per J Tube route every 6 (six) hours as needed for severe pain. 150 mL 0   No current facility-administered medications for this visit.    Facility-Administered Medications Ordered in Other Visits  Medication Dose Route Frequency Provider Last Rate Last Dose  . 0.9 %  sodium chloride infusion   Intravenous Once Truitt Merle, MD        Allergies:   No known allergies    Social History:  The patient  reports that he quit smoking about 48 years ago. He has a 10.00 pack-year smoking history. He has never used  smokeless tobacco. He reports that he does not drink alcohol or use drugs.   Family History:  The patient's family history includes Alzheimer's disease in his mother; Cancer (age of onset: 61) in his maternal grandfather; Diabetes in his mother; Stroke (age of onset: 70) in his father.    ROS:  Please see the history of present illness. All other systems are reviewed and negative.    PHYSICAL EXAM: VS:  BP (!) 104/58   Pulse 72   Ht 5\' 5"  (1.651 m)   Wt 128 lb 12.8 oz (58.4 kg)   BMI 21.43 kg/m  , BMI Body mass index is 21.43 kg/m. GEN: Well nourished, well developed, male in no acute distress  HEENT: normal for age  Neck: minimal JVD, no carotid bruit, no masses Cardiac: RRR; no murmur, no rubs, or gallops Respiratory:  clear to auscultation bilaterally, normal work of breathing GI: soft, nontender, nondistended, + BS MS: no deformity or atrophy; no edema; distal pulses are 2+ in all 4 extremities   Skin: warm and dry, no rash Neuro:  Strength and sensation are intact Psych: euthymic mood, full affect   EKG:  EKG is ordered today. The ekg ordered today demonstrates SR, HR 72, left axis. No change from previous   Recent Labs: 04/28/2016: ALT 18 05/03/2016: B Natriuretic Peptide 13.6; TSH 2.422 05/04/2016: BUN 14; Creatinine, Ser 0.49; Hemoglobin 11.6; Platelets 316; Potassium 4.3; Sodium 137    Lipid Panel No results found for: CHOL, TRIG, HDL, CHOLHDL, VLDL, LDLCALC, LDLDIRECT   Wt Readings from Last 3 Encounters:  05/24/16 128 lb 12.8 oz (58.4 kg)  04/30/16 121 lb 6.4 oz (55.1 kg)  04/20/16 126 lb 3.2 oz (57.2 kg)     Other studies Reviewed: Additional studies/ records that were reviewed today include: Office notes, hospital records.  ASSESSMENT AND PLAN:  1.  Tachycardia: His BB has been cut back a little and his HR is ok. Told pt we could decrease the atenolol further if needed, but as long as he is asymptomatic and his HR will go over 100 with minimal exertion,  I would prefer to leave it alone. This may improve with continued recovery from the surgery.   If he develops symptoms related to low BP, the atenolol can be decreased to 1/2 tab bid. Hopefully, his HR will not be so easily elevated as his fitness improves.   2. General Cardiology issues: He is having no ischemic or CHF symptoms. He is recovering steadily from the surgery. Continue this, encouraged continued use of incentive spirometry and increase activity per Dr Servando Snare.   3. Malnutrition: he is underweight, the nutritionist told him he should weigh about 135 lbs. He is encouraged to  continue eating as he can and continue the tube feeds.    Current medicines are reviewed at length with the patient today.  The patient does not have concerns regarding medicines.  The following changes have been made:  no change  Labs/ tests ordered today include:   Orders Placed This Encounter  Procedures  . EKG 12-Lead     Disposition:   FU with Dr Percival Spanish  Signed, Rosaria Ferries, PA-C  05/24/2016 11:23 AM    [place] Phone: [phone]; Fax: [phone]  This note was written with the assistance of speech recognition software. Please excuse any transcriptional errors.

## 2016-05-24 NOTE — Patient Instructions (Signed)
Medication Instructions:  Your physician recommends that you continue on your current medications as directed. Please refer to the Current Medication list given to you today.  Labwork: None   Testing/Procedures: None   Follow-Up: Your physician wants you to follow-up in: 6 MONTHS WITH DR HOCHREIN. You will receive a reminder letter in the mail two months in advance. If you don't receive a letter, please call our office to schedule the follow-up appointment.   Any Other Special Instructions Will Be Listed Below (If Applicable).   If you need a refill on your cardiac medications before your next appointment, please call your pharmacy.  

## 2016-06-06 ENCOUNTER — Other Ambulatory Visit: Payer: Self-pay | Admitting: Cardiothoracic Surgery

## 2016-06-06 DIAGNOSIS — C155 Malignant neoplasm of lower third of esophagus: Secondary | ICD-10-CM

## 2016-06-07 ENCOUNTER — Ambulatory Visit (INDEPENDENT_AMBULATORY_CARE_PROVIDER_SITE_OTHER): Payer: Self-pay | Admitting: Cardiothoracic Surgery

## 2016-06-07 ENCOUNTER — Ambulatory Visit
Admission: RE | Admit: 2016-06-07 | Discharge: 2016-06-07 | Disposition: A | Discharge status: Home / Self Care | Payer: No Typology Code available for payment source | Source: Ambulatory Visit | Attending: Cardiothoracic Surgery | Admitting: Cardiothoracic Surgery

## 2016-06-07 ENCOUNTER — Encounter: Payer: Self-pay | Admitting: Cardiothoracic Surgery

## 2016-06-07 VITALS — BP 114/73 | HR 85 | Resp 20 | Ht 68.0 in | Wt 131.8 lb

## 2016-06-07 DIAGNOSIS — C159 Malignant neoplasm of esophagus, unspecified: Secondary | ICD-10-CM

## 2016-06-07 DIAGNOSIS — C155 Malignant neoplasm of lower third of esophagus: Secondary | ICD-10-CM

## 2016-06-07 NOTE — Progress Notes (Signed)
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  SURGEON:  Lanelle Bal, M.D.  Malignant neoplasm of lower third of esophagus [facility])   Staging form: Esophagus - Adenocarcinoma, AJCC 7th Edition   - Clinical stage from 12/15/2015: Stage IIIA (T3, N1, M0) - Signed by Truitt Merle, MD on 12/24/2015   - Pathologic stage from 04/26/2016: yT0, N0, cM0, GX - Signed by [name], MD on 04/27/2016   History of Present Illness:     Patient's making good progress at home. He notes he has not taken any pain medicine since discharge. He is using tube feedings at night and taking a by mouth diet during the day. He does note that he gets full very quickly. His bowel function has been fairly normal without evidence of dumping. He does note with the 4 cans of feedings at night he is really not very hungry during the day.    Past Medical History:  Diagnosis Date  . Esophageal cancer ([place]) 11/23/15   lower 3rd esohagus   . GERD (gastroesophageal reflux disease)   . Hypertension      History  Smoking Status  . Former Smoker  . Packs/day: 1.00  . Years: 10.00  . Quit date: 07/24/1967  Smokeless Tobacco  . Never Used    History  Alcohol Use No     Allergies  Allergen Reactions  . No Known  Allergies     Current Outpatient Prescriptions  Medication Sig Dispense Refill  . acetaminophen (TYLENOL) 160 MG/5ML solution Place 10.2 mLs (325 mg total) into feeding tube every 4 (four) hours as needed for moderate pain. 120 mL 0  . atenolol (TENORMIN) 25 MG tablet Take 1 tab in the morning and half tab in the evening 60 tablet 1  . fluticasone (FLONASE) 50 MCG/ACT nasal spray Place 1 spray into both nostrils daily as needed for allergies or rhinitis.    . Nutritional Supplements (FEEDING SUPPLEMENT, OSMOLITE 1.5 CAL,) LIQD Increase Osmolite 1.5 to 70 mL/hr continuously from 6 pm to 6 am  hours via Jejunostomy with 50 mL free water flush every hour. Flush with 120 mL water once daily when changing bag. 1659 mL 0  . oxyCODONE (ROXICODONE) 5 MG/5ML solution 5 mLs (5 mg total) by Per J Tube route every 6 (six) hours as needed for severe pain. 150 mL 0   No current facility-administered medications for this visit.    Facility-Administered Medications Ordered in Other Visits  Medication Dose Route Frequency Provider Last Rate Last Dose  . 0.9 %  sodium chloride infusion   Intravenous Once Truitt Merle, MD           Physical Exam: BP 114/73 (BP Location: Right Arm, Patient Position: Sitting, Cuff Size: Small)   Pulse 85   Resp 20   Ht 5\' 8"  (  1.727 m)   Wt 131 lb 12.8 oz (59.8 kg)   SpO2 97% Comment: RA  BMI 20.04 kg/m   General appearance: alert and cooperative Neurologic: intact Heart: regular rate and rhythm, S1, S2 normal, no murmur, click, rub or gallop Lungs: diminished breath sounds LLL Abdomen: soft, non-tender; bowel sounds normal; no masses,  no organomegaly Extremities: extremities normal, atraumatic, no cyanosis or edema and Homans sign is negative, no sign of DVT Wound: Neck and abdominal incision are well-healed, feeding tube is intact. There is no cervical or supraclavicular adenopathy   Diagnostic Studies & Laboratory data:     Recent Radiology Findings:  Dg Chest 2  View  Result Date: 06/07/2016 CLINICAL DATA:  Carcinoma lower third of esophagus.  Esophagectomy. EXAM: CHEST  2 VIEW COMPARISON:  05/24/2016 FINDINGS: Small to moderate left effusion unchanged. Left lower lobe atelectasis unchanged. Small right pleural effusion similar to the prior study. Negative for heart failure or pneumonia. IMPRESSION: Bilateral pleural effusions left greater than right unchanged from the prior study. Left lower lobe atelectasis also unchanged. Electronically Signed   By: Franchot Gallo M.D.   On: 06/07/2016 14:38    Dg Chest 2 View  Result Date: 06/07/2016 CLINICAL DATA:  Carcinoma lower third of esophagus.  Esophagectomy. EXAM: CHEST  2 VIEW COMPARISON:  05/24/2016 FINDINGS: Small to moderate left effusion unchanged. Left lower lobe atelectasis unchanged. Small right pleural effusion similar to the prior study. Negative for heart failure or pneumonia. IMPRESSION: Bilateral pleural effusions left greater than right unchanged from the prior study. Left lower lobe atelectasis also unchanged. Electronically Signed   By: Franchot Gallo M.D.   On: 06/07/2016 14:38      Recent Lab Findings: Lab Results  Component Value Date   WBC 9.1 05/04/2016   HGB 11.6 (L) 05/04/2016   HCT 35.3 (L) 05/04/2016   PLT 316 05/04/2016   GLUCOSE 126 (H) 05/04/2016   ALT 18 04/28/2016   AST 20 04/28/2016   NA 137 05/04/2016   K 4.3 05/04/2016   CL 102 05/04/2016   CREATININE 0.49 (L) 05/04/2016   BUN 14 05/04/2016   CO2 27 05/04/2016   TSH 2.422 05/03/2016   INR 1.06 04/20/2016   Wt Readings from Last 3 Encounters:  06/07/16 131 lb 12.8 oz (59.8 kg)  05/24/16 128 lb 12.8 oz (58.4 kg)  05/24/16 128 lb 12.8 oz (58.4 kg)      Assessment / Plan:     Patient making good progress postoperatively after total esophagectomy, still using tube feedings at night but is increasing his by mouth diet satisfactorily. He is gaining weight. , We will continue to feedings at night but cut back to 3  cans each night rather than four. We'll continue with the current tube feedings at night and by mouth diet in the day I'll see him back in 3 weeks with a follow-up chest x-ray- Small left pleural effusion basically unchanged   [name] MD      [name] [place], [address] Office [phone]   Beeper [phone]  06/07/2016 3:14 PM

## 2016-06-08 ENCOUNTER — Ambulatory Visit (HOSPITAL_BASED_OUTPATIENT_CLINIC_OR_DEPARTMENT_OTHER): Payer: No Typology Code available for payment source | Admitting: Hematology

## 2016-06-08 ENCOUNTER — Telehealth: Payer: Self-pay | Admitting: Hematology

## 2016-06-08 ENCOUNTER — Ambulatory Visit (HOSPITAL_BASED_OUTPATIENT_CLINIC_OR_DEPARTMENT_OTHER): Payer: No Typology Code available for payment source

## 2016-06-08 VITALS — BP 123/60 | HR 75 | Temp 98.5°F | Resp 18 | Ht 68.0 in | Wt 131.7 lb

## 2016-06-08 DIAGNOSIS — C155 Malignant neoplasm of lower third of esophagus: Secondary | ICD-10-CM

## 2016-06-08 DIAGNOSIS — I10 Essential (primary) hypertension: Secondary | ICD-10-CM

## 2016-06-08 DIAGNOSIS — E440 Moderate protein-calorie malnutrition: Secondary | ICD-10-CM | POA: Diagnosis not present

## 2016-06-08 LAB — COMPREHENSIVE METABOLIC PANEL
ALBUMIN: 3.3 g/dL — AB (ref 3.5–5.0)
ALK PHOS: 138 U/L (ref 40–150)
ALT: 17 U/L (ref 0–55)
AST: 20 U/L (ref 5–34)
Anion Gap: 8 mEq/L (ref 3–11)
BILIRUBIN TOTAL: 0.46 mg/dL (ref 0.20–1.20)
BUN: 14.3 mg/dL (ref 7.0–26.0)
CALCIUM: 9.6 mg/dL (ref 8.4–10.4)
CO2: 27 mEq/L (ref 22–29)
Chloride: 106 mEq/L (ref 98–109)
Creatinine: 0.7 mg/dL (ref 0.7–1.3)
EGFR: >90 mL/min/{1.73_m2} (ref >90)
GLUCOSE: 99 mg/dL (ref 70–140)
POTASSIUM: 4.2 meq/L (ref 3.5–5.1)
SODIUM: 140 meq/L (ref 136–145)
TOTAL PROTEIN: 6.8 g/dL (ref 6.4–8.3)

## 2016-06-08 LAB — CBC WITH DIFFERENTIAL/PLATELET
BASO%: 0.7 % (ref 0.0–2.0)
BASOS ABS: 0.1 10*3/uL (ref 0.0–0.1)
EOS ABS: 0.3 10*3/uL (ref 0.0–0.5)
EOS%: 3.9 % (ref 0.0–7.0)
HEMATOCRIT: 40.6 % (ref 38.4–49.9)
HEMOGLOBIN: 13.5 g/dL (ref 13.0–17.1)
LYMPH#: 1.5 10*3/uL (ref 0.9–3.3)
LYMPH%: 20.1 % (ref 14.0–49.0)
MCH: 29.5 pg (ref 27.2–33.4)
MCHC: 33.4 g/dL (ref 32.0–36.0)
MCV: 88.3 fL (ref 79.3–98.0)
MONO#: 0.9 10*3/uL (ref 0.1–0.9)
MONO%: 11.3 % (ref 0.0–14.0)
NEUT%: 64 % (ref 39.0–75.0)
NEUTROS ABS: 4.8 10*3/uL (ref 1.5–6.5)
Platelets: 240 10*3/uL (ref 140–400)
RBC: 4.59 10*6/uL (ref 4.20–5.82)
RDW: 14.6 % (ref 11.0–14.6)
WBC: 7.5 10*3/uL (ref 4.0–10.3)

## 2016-06-08 NOTE — Progress Notes (Signed)
[place]  [phone] [phone]  Clinic Follow up Note   Patient Care Team: Fred Langton, MD as PCP - General (Internal Medicine) Fred Merle, MD as Consulting Physician (Hematology) Fred Rudd, MD as Consulting Physician (Radiation Oncology) Fred Isaac, MD as Consulting Physician (Cardiothoracic Surgery) Fred [name], RD as Dietitian (Nutrition) Fred Breeding, MD as Consulting Physician (Cardiology) Fred Ade, RN as Registered Nurse   CHIEF COMPLAINTS:  Follow up esophageal adenocarcinoma  Oncology History   Presented with solid dysphagia at least daily with chest pain. Involuntary weight loss of 10-12 lb over past year  Malignant neoplasm of lower third of esophagus (HCC)   Staging form: Esophagus - Adenocarcinoma, AJCC 7th Edition   - Clinical stage from 12/15/2015: Stage IIIA (T3, N1, M0) - Signed by Fred Merle, MD on 12/24/2015   - Pathologic stage from 04/26/2016: yT0, N0, cM0, GX - Signed by Fred Isaac, MD on 04/27/2016      Malignant neoplasm of lower third of esophagus ([place])   11/23/2015 Procedure    EGD per [facility](Dr. [place]):6cm mass in lower third of esophagus      11/24/2015 Initial Diagnosis    Esophageal cancer ([place])      11/24/2015 Pathology Results    Mucinous adenocarcinoma, moderately differentiated-Her2 analysis pending      12/05/2015 Imaging    PET scan showed hypermetabolic a mass in distal esophagus, hypermetabolic activity in the left anterior prostate gland, metabolic density in left adrenal nodule, CT attenuation suggest a benign adrenal adenoma. Left apical 2 mm lung nodule.      12/15/2015 Procedure    EUS showed a uT3N1 lesion from 27cm to 35cm from the incisors       12/26/2015 - 02/02/2016 Chemotherapy    Weekly carboplatin AUC 2 and Taxol 45 mg/m, with concurrent radiation      12/26/2015 - 02/02/2016 Radiation Therapy    Neoadjuvant chemoradiation to the  esophageal cancer      04/23/2016 Surgery    total esophagectomy with cervical esophagogastrostomy, pyloroplasty, feeding jejunostomy       04/23/2016 Pathology Results    Esophagectomy showed no residual tumor, 8 lymph nodes were all negative, incidental leiomyoma 0.2 cm       HISTORY OF PRESENTING ILLNESS (12/14/2015):  Fred [name] 75 y.o. male is here because of His newly diagnosed esophageal cancer. He is accompanied by his wife and daughter to my clinic today.  He has had dysphagia for 2 months, mainly with solid food, no dysphagia with soft food or liquid. He also has mild pain in mid chest when he swallows. No nausea, abdominal pain, or other discomfort. He has lost about 15 lbs in the last year, no other symoptoms. He was seen by his primary care physician at [facility], and referred to gastroenterologist Dr. Alice [name]. He underwent EGD on 11/23/2015, which showed a 6 cm mass in the lower third of esophagus. Biopsy showed adenocarcinoma, moderately differentiated. He was referred to [place] to consider neoadjuvant therapy. He is scheduled to see a Psychologist, sport and exercise at [facility] later this week.  He has had some urinary frequency, underwent TURP on 11/02/2015.   CURRENT THERAPY: supportive care, recovery from surgery and   INTERIM HISTORY: Fred [name] returns for follow up. He underwent total esophagectomy, With cervical esophagogastrostomy, and J-tube placement on 04/23/2016. He tolerated surgery well, and has been which currently well. He eats small meals, and still on tube feeds at night. He  has gained some weight after surgery. He denies any significant pain, nausea, or other symptoms. His energy level has improved, he is able to take care of himself and do some light activities.   MEDICAL HISTORY:  Past Medical History:  Diagnosis Date  . Esophageal cancer ([place]) 11/23/15   lower 3rd esohagus   . GERD (gastroesophageal reflux disease)   . Hypertension     SURGICAL HISTORY: Past Surgical  History:  Procedure Laterality Date  . CHEST TUBE INSERTION Left 04/23/2016   Procedure: CHEST TUBE INSERTION;  Surgeon: Fred Isaac, MD;  Location: [place];  Service: Thoracic;  Laterality: Left;  . COMPLETE ESOPHAGECTOMY N/A 04/23/2016   Procedure: TRANSHIATIAL TOTAL ESOPHAGECTOMY COMPLETE; CERVICAL ESOPHAGOGASTROSTOMY AND PYLOROPLASTY;  Surgeon: Fred Isaac, MD;  Location: [place];  Service: Thoracic;  Laterality: N/A;  . cystoscope  4/12/1   prostate  . ERCP    . INGUINAL HERNIA REPAIR    . JEJUNOSTOMY N/A 04/23/2016   Procedure: FEEDING JEJUNOSTOMY;  Surgeon: Fred Isaac, MD;  Location: [place];  Service: Thoracic;  Laterality: N/A;  . LEG SURGERY Left    hole  . PROSTATE BIOPSY  10/05/15  . right shoulder surgery    . VIDEO BRONCHOSCOPY N/A 04/23/2016   Procedure: VIDEO BRONCHOSCOPY;  Surgeon: Fred Isaac, MD;  Location: [facility] OR;  Service: Thoracic;  Laterality: N/A;    SOCIAL HISTORY: Social History   Social History  . Marital status: Married    Spouse name: N/A  . Number of children: 1  . Years of education: N/A   Occupational History  . Truck Geophysicist/field seismologist    Social History Main Topics  . Smoking status: Former Smoker    Packs/day: 1.00    Years: 10.00    Quit date: 07/24/1967  . Smokeless tobacco: Never Used  . Alcohol use No  . Drug use: No  . Sexual activity: Not Currently   Other Topics Concern  . Not on file   Social History Narrative  . No narrative on file    FAMILY HISTORY: Family History  Problem Relation Age of Onset  . Cancer Maternal Grandfather 65    gastric cancer   . Alzheimer's disease Mother   . Diabetes Mother   . Stroke Father 14    ALLERGIES:  is allergic to no known allergies.  MEDICATIONS:  Current Outpatient Prescriptions  Medication Sig Dispense Refill  . acetaminophen (TYLENOL) 160 MG/5ML solution Place 10.2 mLs (325 mg total) into feeding tube every 4 (four) hours as needed for moderate pain. 120 mL 0  . atenolol  (TENORMIN) 25 MG tablet Take 1 tab in the morning and half tab in the evening 60 tablet 1  . fluticasone (FLONASE) 50 MCG/ACT nasal spray Place 1 spray into both nostrils daily as needed for allergies or rhinitis.    . Nutritional Supplements (FEEDING SUPPLEMENT, OSMOLITE 1.5 CAL,) LIQD Increase Osmolite 1.5 to 70 mL/hr continuously from 6 pm to 6 am  hours via Jejunostomy with 50 mL free water flush every hour. Flush with 120 mL water once daily when changing bag. 1659 mL 0  . oxyCODONE (ROXICODONE) 5 MG/5ML solution 5 mLs (5 mg total) by Per J Tube route every 6 (six) hours as needed for severe pain. 150 mL 0   No current facility-administered medications for this visit.    Facility-Administered Medications Ordered in Other Visits  Medication Dose Route Frequency Provider Last Rate Last Dose  . 0.9 %  sodium chloride  infusion   Intravenous Once Fred Merle, MD        REVIEW OF SYSTEMS:   Constitutional: Denies fevers, chills or abnormal night sweats, very fatigued, (+) weight loss  Eyes: Denies blurriness of vision, double vision or watery eyes Ears, nose, mouth, throat, and face: Denies mucositis or sore throat Respiratory: Denies cough, dyspnea or wheezes Cardiovascular: Denies palpitation, chest discomfort or lower extremity swelling Gastrointestinal:  Denies nausea, heartburn or change in bowel habits Skin: Denies abnormal skin rashes Lymphatics: Denies new lymphadenopathy or easy bruising Neurological:Denies numbness, tingling or new weaknesses Behavioral/Psych: Mood is stable, no new changes  All other systems were reviewed with the patient and are negative.  PHYSICAL EXAMINATION: ECOG PERFORMANCE STATUS: 2 BP 123/60 (BP Location: Left Arm, Patient Position: Sitting)   Pulse 75   Temp 98.5 F (36.9 C) (Oral)   Resp 18   Ht _0  (1.727 m)   Wt 131 lb 11.2 oz (59.7 kg)   SpO2 99%   BMI 20.02 kg/m   GENERAL:alert, no distress and comfortable SKIN: skin color, texture, turgor  are normal, no rashes or significant lesions EYES: normal, conjunctiva are pink and non-injected, sclera clear OROPHARYNX:no exudate, no erythema and lips, buccal mucosa, and tongue normal  NECK: supple, thyroid normal size, non-tender, without nodularity LYMPH:  no palpable lymphadenopathy in the cervical, axillary or inguinal LUNGS: clear to auscultation and percussion with normal breathing effort HEART: regular rate & rhythm and no murmurs and no lower extremity edema ABDOMEN:abdomen soft, non-tender and normal bowel sounds, J tube in place  Musculoskeletal:no cyanosis of digits and no clubbing  PSYCH: alert & oriented x 3 with fluent speech NEURO: no focal motor/sensory deficits  LABORATORY DATA:  I have reviewed the data as listed CBC Latest Ref Rng & Units 06/08/2016 05/04/2016 04/30/2016  WBC 4.0 - 10.3 10e3/uL 7.5 9.1 9.4  Hemoglobin 13.0 - 17.1 g/dL 13.5 11.6(L) 11.9(L)  Hematocrit 38.4 - 49.9 % 40.6 35.3(L) 36.1(L)  Platelets 140 - 400 10e3/uL 240 316 263   CMP Latest Ref Rng & Units 06/08/2016 05/04/2016 04/30/2016  Glucose 70 - 140 mg/dl 99 126(H) 153(H)  BUN 7.0 - 26.0 mg/dL 14._1 Creatinine 0.7 - 1.3 mg/dL 0.7 0.49(L) 0.45(L)  Sodium 136 - 145 mEq/L 140 137 136  Potassium 3.5 - 5.1 mEq/L 4.2 4.3 3.7  Chloride 101 - 111 mmol/L - 102 103  CO2 22 - 29 mEq/L _2 Calcium 8.4 - 10.4 mg/dL 9.6 9.0 8.8(L)  Total Protein 6.4 - 8.3 g/dL 6.8 - -  Total Bilirubin 0.20 - 1.20 mg/dL 0.46 - -  Alkaline Phos 40 - 150 U/L 138 - -  AST 5 - 34 U/L 20 - -  ALT 0 - 55 U/L 17 - -    Pathology report Diagnosis 11/23/2015 Mass, lower third of the esophagus, mucosal biopsy Mucinous adenocarcinoma, moderately differentiated.  HER 2 (-)   Diagnosis 04/23/2016 1. Lymph node, biopsy, Cervical - ONE OF ONE LYMPH NODES NEGATIVE FOR CARCINOMA (0/1). 2. Lymph node, biopsy, Periesophageal - BLOOD WITH SCANT BENIGN SOFT TISSUE AND SKELETAL MUSCLE. - NO MALIGNANCY IDENTIFIED. 3.  Esophagogastrectomy - ULCERATION WITH INFLAMMATION AND REACTIVE CHANGES. - FOCAL INTESTINAL METAPLASIA. - CHANGES CONSISTENT WITH TREATMENT EFFECT. - SEVEN OF SEVEN LYMPH NODES NEGATIVE FOR CARCINOMA (0/7). - INCIDENTAL LEIOMYOMA, 0.2 CM. - MARGINS ARE NEGATIVE FOR DYSPLASIA OR MALIGNANCY. - SEE ONCOLOGY TABLE. Microscopic Comment 3. ESOPHAGUS: Specimen: Esophagus and proximal stomach, cervical lymph node. Procedure: Esophagogastrectomy  and cervical lymph node biopsy. Tumor Site: Presumed GE junction, see comment. Relationship of Tumor to esophagogastric junction: Can not be assessed. Distance of tumor center from esophagogastric junction: Can not be assessed. Tumor Size Greatest dimension: No residual tumor present. Histologic Type: Per medical record adenocarcinoma (no biopsy for review). Histologic Grade: N/A. Microscopic Tumor Extension: N/A. Margins: Negative. Treatment Effect: Present (TRS 0). Lymph-Vascular Invasion: Not identified. Perineural Invasion: Not identified. Lymph nodes: number examined 8; number positive: 0 TNM: ypT0, ypN0 see comment. 1 of 3 FINAL for DARRIS, STAIGER ([id number]) Microscopic Comment(continued) Ancillary studies: None. Comments: The entire GE junction is submitted for evaluation. There is ulceration with associated inflammation. There is acellular mucin/myxoid changes which extend through the muscularis propria, but no viable/residual tumor is identified and thus the stage is ypT0. There is focal background intestinal metaplasia, which may have been pre-existing (Barrett's esophagus) or related to therapy.   RADIOGRAPHIC STUDIES: I have personally reviewed the radiological images as listed and agreed with the findings in the report. Dg Chest 2 View  Result Date: 06/07/2016 CLINICAL DATA:  Carcinoma lower third of esophagus.  Esophagectomy. EXAM: CHEST  2 VIEW COMPARISON:  05/24/2016 FINDINGS: Small to moderate left effusion unchanged. Left  lower lobe atelectasis unchanged. Small right pleural effusion similar to the prior study. Negative for heart failure or pneumonia. IMPRESSION: Bilateral pleural effusions left greater than right unchanged from the prior study. Left lower lobe atelectasis also unchanged. Electronically Signed   By: Franchot Gallo M.D.   On: 06/07/2016 14:38   Dg Chest 2 View  Result Date: 05/24/2016 CLINICAL DATA:  Status post esophagectomy on April 23, 2026. Follow-up of persistent left-sided hydropneumothorax. EXAM: CHEST  2 VIEW COMPARISON:  PA and lateral chest x-ray dated May 04, 2016 FINDINGS: The pneumothorax is not clearly evident today. The small to moderate size left pleural effusion is stable. There is a stable small right pleural effusion layering posteriorly and laterally. The heart and pulmonary vascularity are normal. The retrocardiac region remains dense. The mediastinum is normal in width. IMPRESSION: Interval resolution of the left-sided pneumothorax. There remains a small to moderate size left pleural effusion with left lower lobe atelectasis, and a small right pleural effusion. Electronically Signed   By: David  Martinique M.D.   On: 05/24/2016 15:22   EGD by Dr. Alice [name] 12/20/2015 Impression: -A 6 cm mass was found in the lower third of the esophagus. Multiple biopsies were performed. -A sliding small sized hiatal hernia otherwise normal mucosa in stomach into duodenum.  PET scan 12/05/2015 ([facility]) Impression #1 distal esophagus which she'll hypermetabolic mass consistent with known malignancy #2 hypermetabolic activity in the left anterior prostate gland, correlate for malignancy #3 metabolic density left adrenal nodule. CT attenuation suggest a benign adrenal adenoma which can be moderately hypermetabolic. However given history of malignancy, metastatic disease is not excluded. #4 left apical 2 mm pulmonary nodule, too small for evaluation by PET.  EUS at [facility]  12/14/2015: Impressions: Mass was noted from 27 cm to 35 cm from the in sisters. By EUS criteria and the lesion was T3, there was 1-2 metastatic regional lymph nodes.  ASSESSMENT & PLAN:  75 year old male presented with dysphagia with solid food  1. Distal esophageal adenocarcinoma, uT3N1M0, stage IIIA, ypT0N0M0 -I reviewed his CT scan, PET scan, EGD, and the biopsy findings with patient and his family member in details. -The PET scan showed no definitive distant metastasis. The left adrenal gland hypermetabolic mass is probably a benign adenoma, based on  the CT characteristic. This will be followed in the future scan. -By EUS, he had T3 N1 stage III disease. -He completed neoadjuvant radiation with weekly Carbo and Taxol. -I reviewed his surgical pathology findings, he has had complete pathologic response to neoadjuvant chemotherapy and radiation, which predicts good prognosis -He is recovering well from surgery, he is still on tube feeding and night, but has been eating well, will continue supportive care. -We discussed cancer surveillance, I plan to see him every 3-4 months for the next 2 years, then every 6 months up to a total 5 years of surveillance. We'll plan to repeat surveillance CT scan every 6-12 months, sooner if clinically indicated   2. HTN -Continue medication and follow-up of his primary care physician  3. Malnutrition and weight loss -Doing better, he is tolerating J-tube feeding very well, he eats small meals -Follow up with dietitian  4. Depression  -Continue mirtazapine   Plan -follow up with lab in 2 months, plan to order surveillance CT scan on next visit  All questions were answered. The patient knows to call the clinic with any problems, questions or concerns.  I spent 20 minutes counseling the patient face to face. The total time spent in the appointment was 25 minutes and more than 50% was on counseling.     Fred Merle, MD 06/08/2016

## 2016-06-08 NOTE — Telephone Encounter (Signed)
Appointments scheduled per 11/17 LOS. Patient given AVS report and calendars with future scheduled appointments. °

## 2016-06-10 ENCOUNTER — Encounter: Payer: Self-pay | Admitting: Hematology

## 2016-06-27 ENCOUNTER — Other Ambulatory Visit: Payer: Self-pay | Admitting: Cardiothoracic Surgery

## 2016-06-27 DIAGNOSIS — C159 Malignant neoplasm of esophagus, unspecified: Secondary | ICD-10-CM

## 2016-06-28 ENCOUNTER — Ambulatory Visit
Admission: RE | Admit: 2016-06-28 | Discharge: 2016-06-28 | Disposition: A | Discharge status: Home / Self Care | Payer: No Typology Code available for payment source | Source: Ambulatory Visit | Attending: Cardiothoracic Surgery | Admitting: Cardiothoracic Surgery

## 2016-06-28 ENCOUNTER — Ambulatory Visit (INDEPENDENT_AMBULATORY_CARE_PROVIDER_SITE_OTHER): Payer: Self-pay | Admitting: Cardiothoracic Surgery

## 2016-06-28 ENCOUNTER — Other Ambulatory Visit: Payer: Self-pay | Admitting: *Deleted

## 2016-06-28 VITALS — BP 107/64 | HR 73 | Resp 16 | Ht 68.0 in | Wt 133.2 lb

## 2016-06-28 DIAGNOSIS — C159 Malignant neoplasm of esophagus, unspecified: Secondary | ICD-10-CM

## 2016-06-28 DIAGNOSIS — Z09 Encounter for follow-up examination after completed treatment for conditions other than malignant neoplasm: Secondary | ICD-10-CM

## 2016-06-28 DIAGNOSIS — J90 Pleural effusion, not elsewhere classified: Secondary | ICD-10-CM

## 2016-06-28 DIAGNOSIS — C155 Malignant neoplasm of lower third of esophagus: Secondary | ICD-10-CM

## 2016-06-28 NOTE — Progress Notes (Signed)
[address]       [place] [address]             [phone]      Amor B [place] Medical Record [id number] Date of Birth: 1940/08/23  Referring: Truitt Merle, MD Primary Care: [name], MD  Chief Complaint:   POST OP FOLLOW UP 04/23/2016 OPERATIVE REPORT PREOPERATIVE DIAGNOSIS:  Adenocarcinoma of the distal esophagus previously treated with radiation and chemotherapy, clinically staged at IIIA POSTOPERATIVE DIAGNOSIS:  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 SURGEON:  Lanelle Bal, M.D.  Malignant neoplasm of lower third of esophagus [facility])   Staging form: Esophagus - Adenocarcinoma, AJCC 7th Edition   - Clinical stage from 12/15/2015: Stage IIIA (T3, N1, M0) - Signed by Truitt Merle, MD on 12/24/2015   - Pathologic stage from 04/26/2016: yT0, N0, cM0, GX - Signed by [name], MD on 04/27/2016   History of Present Illness:     Patient returns to the office today, continues to make progress at home increasing his activity. He has taken a by mouth diet but does become full quickly. Still using tube feedings 3 cans a night He's had no respiratory complaints   Past Medical History:  Diagnosis Date  . Esophageal cancer ([place]) 11/23/15   lower 3rd esohagus   . GERD (gastroesophageal reflux disease)   . Hypertension      History  Smoking Status  . Former Smoker  . Packs/day: 1.00  . Years: 10.00  . Quit date: 07/24/1967  Smokeless Tobacco  . Never Used    History  Alcohol Use No     Allergies  Allergen Reactions  . No Known Allergies     Current Outpatient Prescriptions  Medication Sig Dispense Refill  . atenolol (TENORMIN) 25 MG tablet Take 1 tab in the morning and half tab in the  evening 60 tablet 1  . fluticasone (FLONASE) 50 MCG/ACT nasal spray Place 1 spray into both nostrils daily as needed for allergies or rhinitis.    . Nutritional Supplements (FEEDING SUPPLEMENT, OSMOLITE 1.5 CAL,) LIQD Increase Osmolite 1.5 to 70 mL/hr continuously from 6 pm to 6 am  hours via Jejunostomy with 50 mL free water flush every hour. Flush with 120 mL water once daily when changing bag. 1659 mL 0  . oxyCODONE (ROXICODONE) 5 MG/5ML solution 5 mLs (5 mg total) by Per J Tube route every 6 (six) hours as needed for severe pain. 150 mL 0   No current facility-administered medications for this visit.    Facility-Administered Medications Ordered in Other Visits  Medication Dose Route Frequency Provider Last Rate Last Dose  . 0.9 %  sodium chloride infusion   Intravenous Once Truitt Merle, MD           Physical Exam: BP 107/64   Pulse 73   Resp 16   Ht 5\' 8"  (1.727 m)   Wt 133 lb 3.2 oz (60.4 kg)   SpO2 98% Comment: ON RA  BMI 20.25 kg/m   General appearance: alert and cooperative Neurologic: intact Heart: regular rate and rhythm, S1, S2 normal, no murmur, click, rub or gallop Lungs: diminished breath sounds LLL Abdomen: soft, non-tender; bowel sounds normal; no masses,  no organomegaly Extremities: extremities normal, atraumatic, no cyanosis or edema and Homans sign is negative, no sign  of DVT Wound: Neck and abdominal incision are well-healed, feeding tube is intact. There is no cervical or supraclavicular adenopathy   Diagnostic Studies & Laboratory data:     Recent Radiology Findings:  Dg Chest 2 View  Result Date: 06/28/2016 CLINICAL DATA:  Follow-up of bilateral pleural effusions. History of esophageal malignancy. No current chest complaints. EXAM: CHEST  2 VIEW COMPARISON:  Chest x-ray of June 07, 2016 FINDINGS: The right lung is well-expanded. A small pleural effusion is present but stable. On the left a moderate-sized pleural effusion is present and has increased  slightly in size. There is no mediastinal shift. There is no alveolar infiltrate or parenchymal mass observed. The heart and pulmonary vascularity are normal. The observed bony thorax is normal. There is chronic wedge compression of L1. A tubular structure projects at the inferior margin of the image consistent with a feeding jejunostomy tube. IMPRESSION: Slight interval increase in the volume of the moderate-sized left-sided pleural effusion. Stable small right-sided pleural effusion. Electronically Signed   By: David  Martinique M.D.   On: 06/28/2016 08:46        Recent Lab Findings: Lab Results  Component Value Date   WBC 7.5 06/08/2016   HGB 13.5 06/08/2016   HCT 40.6 06/08/2016   PLT 240 06/08/2016   GLUCOSE 99 06/08/2016   ALT 17 06/08/2016   AST 20 06/08/2016   NA 140 06/08/2016   K 4.2 06/08/2016   CL 102 05/04/2016   CREATININE 0.7 06/08/2016   BUN 14.3 06/08/2016   CO2 27 06/08/2016   TSH 2.422 05/03/2016   INR 1.06 04/20/2016   Wt Readings from Last 3 Encounters:  06/28/16 133 lb 3.2 oz (60.4 kg)  06/08/16 131 lb 11.2 oz (59.7 kg)  06/07/16 131 lb 12.8 oz (59.8 kg)      Assessment / Plan:    We'll continue with tube feedings at nighttime only,  Plan left ultrasound-guided thoracentesis to drain the left chest dry and send fluid for cytology Follow-up chest x-ray in 3-4 weeks   [name] MD      [address] [place] [address] Office [phone]   Beeper [phone]  06/28/2016 9:20 AM

## 2016-07-04 ENCOUNTER — Ambulatory Visit (HOSPITAL_COMMUNITY)
Admission: RE | Admit: 2016-07-04 | Discharge: 2016-07-04 | Disposition: A | Discharge status: Home / Self Care | Payer: Non-veteran care | Source: Ambulatory Visit | Attending: Student | Admitting: Student

## 2016-07-04 ENCOUNTER — Ambulatory Visit (HOSPITAL_COMMUNITY)
Admission: RE | Admit: 2016-07-04 | Discharge: 2016-07-04 | Disposition: A | Discharge status: Home / Self Care | Payer: Non-veteran care | Source: Ambulatory Visit | Attending: Cardiothoracic Surgery | Admitting: Cardiothoracic Surgery

## 2016-07-04 DIAGNOSIS — J90 Pleural effusion, not elsewhere classified: Secondary | ICD-10-CM | POA: Insufficient documentation

## 2016-07-04 DIAGNOSIS — Z9889 Other specified postprocedural states: Secondary | ICD-10-CM | POA: Diagnosis not present

## 2016-07-04 NOTE — Procedures (Signed)
Successful US guided left thoracentesis. Yielded 1.1 L of amber fluid. Pt tolerated procedure well. No immediate complications.  Specimen was sent for labs. CXR ordered.  Docia Barrier PA-C 07/04/2016 10:51 AM

## 2016-07-25 ENCOUNTER — Other Ambulatory Visit: Payer: Self-pay | Admitting: Cardiothoracic Surgery

## 2016-07-25 DIAGNOSIS — C155 Malignant neoplasm of lower third of esophagus: Secondary | ICD-10-CM

## 2016-07-26 ENCOUNTER — Ambulatory Visit
Admission: RE | Admit: 2016-07-26 | Discharge: 2016-07-26 | Disposition: A | Discharge status: Home / Self Care | Payer: No Typology Code available for payment source | Source: Ambulatory Visit | Attending: Cardiothoracic Surgery | Admitting: Cardiothoracic Surgery

## 2016-07-26 ENCOUNTER — Encounter: Payer: Self-pay | Admitting: Cardiothoracic Surgery

## 2016-07-26 ENCOUNTER — Ambulatory Visit (INDEPENDENT_AMBULATORY_CARE_PROVIDER_SITE_OTHER): Payer: Non-veteran care | Admitting: Cardiothoracic Surgery

## 2016-07-26 ENCOUNTER — Other Ambulatory Visit: Payer: Self-pay | Admitting: *Deleted

## 2016-07-26 VITALS — BP 124/64 | HR 72 | Resp 16 | Ht 65.0 in | Wt 135.6 lb

## 2016-07-26 DIAGNOSIS — Z09 Encounter for follow-up examination after completed treatment for conditions other than malignant neoplasm: Secondary | ICD-10-CM | POA: Diagnosis not present

## 2016-07-26 DIAGNOSIS — C155 Malignant neoplasm of lower third of esophagus: Secondary | ICD-10-CM

## 2016-07-26 DIAGNOSIS — C159 Malignant neoplasm of esophagus, unspecified: Secondary | ICD-10-CM

## 2016-07-26 DIAGNOSIS — J90 Pleural effusion, not elsewhere classified: Secondary | ICD-10-CM

## 2016-07-26 NOTE — Progress Notes (Signed)
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 SURGEON:  Lanelle Bal, M.D.  Malignant neoplasm of lower third of esophagus [facility])   Staging form: Esophagus - Adenocarcinoma, AJCC 7th Edition   - Clinical stage from 12/15/2015: Stage IIIA (T3, N1, M0) - Signed by Truitt Merle, MD on 12/24/2015   - Pathologic stage from 04/26/2016: yT0, N0, cM0, GX - Signed by [name], MD on 04/27/2016   History of Present Illness:     Patient returns to the office today, continues to make progress at home increasing his activity. He has taken a by mouth diet but does become full quickly. Still using tube feedings 3 cans a night He's had no respiratory complaints. Patient continues to take a by mouth diet multiple small meals a day without difficulty though he does note that he does not have much appetite.  He was seen recently and his primary care and was told his phosphatase was mildly elevated at 150.  Left thoracentesis was done several weeks ago, cytology on this fluid is reviewed Diagnosis PLEURAL FLUID, LEFT (SPECIMEN 1 OF 1 COLLECTED 07/04/16): REACTIVE APPEARING MESOTHELIAL CELLS. Enid Cutter MD Pathologist, Electronic Signature     Past Medical History:  Diagnosis Date  . Esophageal cancer  ([place]) 11/23/15   lower 3rd esohagus   . GERD (gastroesophageal reflux disease)   . Hypertension      History  Smoking Status  . Former Smoker  . Packs/day: 1.00  . Years: 10.00  . Quit date: 07/24/1967  Smokeless Tobacco  . Never Used    History  Alcohol Use No     Allergies  Allergen Reactions  . No Known Allergies     Current Outpatient Prescriptions  Medication Sig Dispense Refill  . atenolol (TENORMIN) 25 MG tablet Take 1 tab in the morning and half tab in the evening 60 tablet 1  . fluticasone (FLONASE) 50 MCG/ACT nasal spray Place 1 spray into both nostrils daily as needed for allergies or rhinitis.    . Nutritional Supplements (FEEDING SUPPLEMENT, OSMOLITE 1.5 CAL,) LIQD Increase Osmolite 1.5 to 70 mL/hr continuously from 6 pm to 6 am  hours via Jejunostomy with 50 mL free water flush every hour. Flush with 120 mL water once daily when changing bag. 1659 mL 0  . oxyCODONE (ROXICODONE) 5 MG/5ML solution 5 mLs (5 mg total) by Per J Tube route every 6 (six) hours as needed for severe pain. 150 mL 0  . simvastatin (ZOCOR) 20 MG tablet Take 20 mg by mouth daily.     No current facility-administered medications for this visit.    Facility-Administered Medications Ordered in Other Visits  Medication Dose Route Frequency Provider Last Rate Last Dose  . 0.9 %  sodium chloride infusion   Intravenous Once Truitt Merle, MD  Physical Exam: BP 124/64 (BP Location: Right Arm, Patient Position: Sitting, Cuff Size: Normal)   Pulse 72   Resp 16   Ht 5\' 5"  (1.651 m)   Wt 135 lb 9.6 oz (61.5 kg)   SpO2 99% Comment: ON RA  BMI 22.57 kg/m   General appearance: alert and cooperative Neurologic: intact Heart: regular rate and rhythm, S1, S2 normal, no murmur, click, rub or gallop Lungs: diminished breath sounds LLL Abdomen: soft, non-tender; bowel sounds normal; no masses,  no organomegaly Extremities: extremities normal, atraumatic, no cyanosis or edema and Homans sign is  negative, no sign of DVT Wound: Neck and abdominal incision are well-healed, feeding tube is intact. There is no cervical or supraclavicular adenopathy   Diagnostic Studies & Laboratory data:     Recent Radiology Findings:  Dg Chest 2 View  Result Date: 07/26/2016 CLINICAL DATA:  Esophageal carcinoma EXAM: CHEST  2 VIEW COMPARISON:  July 04, 2016 FINDINGS: There is a moderate pleural effusion on the left with consolidation left lower lobe and inferior lingula. Right lung is clear. Heart size and pulmonary vascularity are normal. No adenopathy. Air is noted throughout much of the esophagus. There is cortical irregularity in portions of the posterior left sixth, seventh, and eighth ribs, concerning for potential metastatic foci in these areas. IMPRESSION: Moderate left pleural effusion with left base and inferior lingular consolidation. Right lung clear. Stable cardiac silhouette. Note that there is extensive air throughout much of the esophagus. Concern for bony metastases in the left posterior sixth, seventh, and eighth ribs. Electronically Signed   By: Lowella Grip III M.D.   On: 07/26/2016 09:44    I have independently reviewed the above radiology studies  and reviewed the findings with the patient. I reviewed the x-ray with the patient, with his previous recent x-rays the observation of possible bony metastasis in the left posterior 6/7 and eighth ribs may be an over call- the patient has no pain or symptoms in this area. He does have some increase in the left pleural effusion and this will be re-tapped.    Recent Lab Findings: Lab Results  Component Value Date   WBC 7.5 06/08/2016   HGB 13.5 06/08/2016   HCT 40.6 06/08/2016   PLT 240 06/08/2016   GLUCOSE 99 06/08/2016   ALT 17 06/08/2016   AST 20 06/08/2016   NA 140 06/08/2016   K 4.2 06/08/2016   CL 102 05/04/2016   CREATININE 0.7 06/08/2016   BUN 14.3 06/08/2016   CO2 27 06/08/2016   TSH 2.422 05/03/2016   INR 1.06  04/20/2016   Wt Readings from Last 3 Encounters:  07/26/16 135 lb 9.6 oz (61.5 kg)  06/28/16 133 lb 3.2 oz (60.4 kg)  06/08/16 131 lb 11.2 oz (59.7 kg)      Assessment / Plan:    Patient continues to make steady progress following esophagectomy, continues to increase his weight- will plan continue nighttime tube feedings and nothing by mouth diet during the day Repeat left thoracentesis for effusion, will again check cytology and also chemistries including lipid I discussed  The chest x-ray with the patient, with his previous recent x-rays the observation of possible bony metastasis in the left posterior 6/7 and eighth ribs may be an over call- the patient,  has no pain or symptoms in this area.  [name] MD      [address] [place] [address] Office [phone]   Beeper [phone]  07/26/2016 10:46 AM

## 2016-07-31 ENCOUNTER — Telehealth: Payer: Self-pay | Admitting: *Deleted

## 2016-07-31 NOTE — Telephone Encounter (Signed)
Wife called questioning her billing: [facility] has not paid on three items and she feels it could be error in coding the service. The following she is questioning: DOS 01/16/16--injection DOS 01/30/16--injection DOS 02/24/16--visit with Ned Card Informed her navigator will forward this to managed care manager, who may know who she needs to speak with or how to double check the coding on these days. She will be at [facility] on 1/11 for her husband to have a thoracentesis and would be glad to bring over the papers if this will help. She has spoken with [facility] and [facility] and is not getting anywhere.

## 2016-08-02 ENCOUNTER — Ambulatory Visit (HOSPITAL_COMMUNITY)
Admission: RE | Admit: 2016-08-02 | Discharge: 2016-08-02 | Disposition: A | Discharge status: Home / Self Care | Payer: No Typology Code available for payment source | Source: Ambulatory Visit | Attending: Radiology | Admitting: Radiology

## 2016-08-02 ENCOUNTER — Ambulatory Visit (HOSPITAL_COMMUNITY)
Admission: RE | Admit: 2016-08-02 | Discharge: 2016-08-02 | Disposition: A | Discharge status: Home / Self Care | Payer: No Typology Code available for payment source | Source: Ambulatory Visit | Attending: Cardiothoracic Surgery | Admitting: Cardiothoracic Surgery

## 2016-08-02 DIAGNOSIS — R918 Other nonspecific abnormal finding of lung field: Secondary | ICD-10-CM | POA: Insufficient documentation

## 2016-08-02 DIAGNOSIS — J90 Pleural effusion, not elsewhere classified: Secondary | ICD-10-CM | POA: Insufficient documentation

## 2016-08-02 DIAGNOSIS — Z9889 Other specified postprocedural states: Secondary | ICD-10-CM | POA: Insufficient documentation

## 2016-08-02 LAB — PROTEIN, BODY FLUID: Total protein, fluid: 4 g/dL

## 2016-08-02 NOTE — Procedures (Signed)
Ultrasound-guided diagnostic and therapeutic left thoracentesis performed yielding 900 cc of yellow fluid. No immediate complications. Follow-up chest x-ray pending.The fluid was sent to the lab for preordered studies.

## 2016-08-03 LAB — MISC LABCORP TEST (SEND OUT): Labcorp test code: 88062

## 2016-08-03 LAB — TRIGLYCERIDES, BODY FLUIDS: Triglycerides, Fluid: 17 mg/dL

## 2016-08-05 LAB — CHOLESTEROL, BODY FLUID: Cholesterol, Fluid: 61 mg/dL

## 2016-08-07 NOTE — Progress Notes (Signed)
[place]  [phone] [phone]  Clinic Follow up Note   Patient Care Team: [name], MD as PCP - General (Internal Medicine) Truitt Merle, MD as Consulting Physician (Hematology) Kyung Rudd, MD as Consulting Physician (Radiation Oncology) [name], MD as Consulting Physician (Cardiothoracic Surgery) Karie Mainland, RD as Dietitian (Nutrition) Minus Breeding, MD as Consulting Physician (Cardiology) Tania Ade, RN as Registered Nurse   CHIEF COMPLAINTS:  Follow up esophageal adenocarcinoma  Oncology History   Presented with solid dysphagia at least daily with chest pain. Involuntary weight loss of 10-12 lb over past year  Malignant neoplasm of lower third of esophagus (HCC)   Staging form: Esophagus - Adenocarcinoma, AJCC 7th Edition   - Clinical stage from 12/15/2015: Stage IIIA (T3, N1, M0) - Signed by Truitt Merle, MD on 12/24/2015   - Pathologic stage from 04/26/2016: yT0, N0, cM0, GX - Signed by [name], MD on 04/27/2016      Malignant neoplasm of lower third of esophagus ([place])   11/23/2015 Procedure    EGD per [facility](Dr. [place]edo):6cm mass in lower third of esophagus      11/24/2015 Initial Diagnosis    Esophageal cancer ([place])      11/24/2015 Pathology Results    Mucinous adenocarcinoma, moderately differentiated-Her2 analysis pending      12/05/2015 Imaging    PET scan showed hypermetabolic a mass in distal esophagus, hypermetabolic activity in the left anterior prostate gland, metabolic density in left adrenal nodule, CT attenuation suggest a benign adrenal adenoma. Left apical 2 mm lung nodule.      12/15/2015 Procedure    EUS showed a uT3N1 lesion from 27cm to 35cm from the incisors       12/26/2015 - 02/02/2016 Chemotherapy    Weekly carboplatin AUC 2 and Taxol 45 mg/m, with concurrent radiation      12/26/2015 - 02/02/2016 Radiation Therapy    Neoadjuvant chemoradiation to the  esophageal cancer      04/23/2016 Surgery    total esophagectomy with cervical esophagogastrostomy, pyloroplasty, feeding jejunostomy       04/23/2016 Pathology Results    Esophagectomy showed no residual tumor, 8 lymph nodes were all negative, incidental leiomyoma 0.2 cm       HISTORY OF PRESENTING ILLNESS (12/14/2015):  Fred [name] 76 y.o. male is here because of His newly diagnosed esophageal cancer. He is accompanied by his wife and daughter to my clinic today.  He has had dysphagia for 2 months, mainly with solid food, no dysphagia with soft food or liquid. He also has mild pain in mid chest when he swallows. No nausea, abdominal pain, or other discomfort. He has lost about 15 lbs in the last year, no other symoptoms. He was seen by his primary care physician at [facility], and referred to gastroenterologist Dr. Alice Reichert. He underwent EGD on 11/23/2015, which showed a 6 cm mass in the lower third of esophagus. Biopsy showed adenocarcinoma, moderately differentiated. He was referred to [place] to consider neoadjuvant therapy. He is scheduled to see a Psychologist, sport and exercise at [facility] later this week.  He has had some urinary frequency, underwent TURP on 11/02/2015.   CURRENT THERAPY: Surveillance  INTERIM HISTORY: Mr. Suchy returns for follow up. He has been following up with his surgeon Dr. Servando Snare every month. He had a repeated left thoracentesis on 07/04/2016 and 08/02/2016. Cytology was negative for pain in cells. He has been doing well overall, his appetite and energy level  has recovered well, he still getting tube feeds at night. He denies any significant pain, cough, dyspnea, or other new symptoms. He tolerates routine activities well, although not back to full activities yet.    MEDICAL HISTORY:  Past Medical History:  Diagnosis Date  . Esophageal cancer ([place]) 11/23/15   lower 3rd esohagus   . GERD (gastroesophageal reflux disease)   . Hypertension     SURGICAL HISTORY: Past Surgical  History:  Procedure Laterality Date  . CHEST TUBE INSERTION Left 04/23/2016   Procedure: CHEST TUBE INSERTION;  Surgeon: [name], MD;  Location: [place];  Service: Thoracic;  Laterality: Left;  . COMPLETE ESOPHAGECTOMY N/A 04/23/2016   Procedure: TRANSHIATIAL TOTAL ESOPHAGECTOMY COMPLETE; CERVICAL ESOPHAGOGASTROSTOMY AND PYLOROPLASTY;  Surgeon: [name], MD;  Location: [place];  Service: Thoracic;  Laterality: N/A;  . cystoscope  4/12/1   prostate  . ERCP    . INGUINAL HERNIA REPAIR    . JEJUNOSTOMY N/A 04/23/2016   Procedure: FEEDING JEJUNOSTOMY;  Surgeon: [name], MD;  Location: [place];  Service: Thoracic;  Laterality: N/A;  . LEG SURGERY Left    hole  . PROSTATE BIOPSY  10/05/15  . right shoulder surgery    . VIDEO BRONCHOSCOPY N/A 04/23/2016   Procedure: VIDEO BRONCHOSCOPY;  Surgeon: [name], MD;  Location: [facility] OR;  Service: Thoracic;  Laterality: N/A;    SOCIAL HISTORY: Social History   Social History  . Marital status: Married    Spouse name: N/A  . Number of children: 1  . Years of education: N/A   Occupational History  . Truck Geophysicist/field seismologist    Social History Main Topics  . Smoking status: Former Smoker    Packs/day: 1.00    Years: 10.00    Quit date: 07/24/1967  . Smokeless tobacco: Never Used  . Alcohol use No  . Drug use: No  . Sexual activity: Not Currently   Other Topics Concern  . Not on file   Social History Narrative  . No narrative on file    FAMILY HISTORY: Family History  Problem Relation Age of Onset  . Cancer Maternal Grandfather 66    gastric cancer   . Alzheimer's disease Mother   . Diabetes Mother   . Stroke Father 41    ALLERGIES:  is allergic to no known allergies.  MEDICATIONS:  Current Outpatient Prescriptions  Medication Sig Dispense Refill  . atenolol (TENORMIN) 25 MG tablet Take 1 tab in the morning and half tab in the evening 60 tablet 1  . fluticasone (FLONASE) 50 MCG/ACT nasal spray Place 1 spray into  both nostrils daily as needed for allergies or rhinitis.    . Nutritional Supplements (FEEDING SUPPLEMENT, OSMOLITE 1.5 CAL,) LIQD Increase Osmolite 1.5 to 70 mL/hr continuously from 6 pm to 6 am  hours via Jejunostomy with 50 mL free water flush every hour. Flush with 120 mL water once daily when changing bag. (Patient taking differently: Pt is now taking 24 oz at hs. 08/09/16 Increase Osmolite 1.5 to 70 mL/hr continuously from 6 pm to 6 am  hours via Jejunostomy with 50 mL free water flush every hour. Flush with 120 mL water once daily when changing bag.) 1659 mL 0  . simvastatin (ZOCOR) 20 MG tablet Take 20 mg by mouth daily.    Marland Kitchen oxyCODONE (ROXICODONE) 5 MG/5ML solution 5 mLs (5 mg total) by Per J Tube route every 6 (six) hours as needed for severe pain. (Patient not taking:  Reported on 08/09/2016) 150 mL 0   No current facility-administered medications for this visit.    Facility-Administered Medications Ordered in Other Visits  Medication Dose Route Frequency Provider Last Rate Last Dose  . 0.9 %  sodium chloride infusion   Intravenous Once Truitt Merle, MD        REVIEW OF SYSTEMS:   Constitutional: Denies fevers, chills or abnormal night sweats, very fatigued, (+) weight loss  Eyes: Denies blurriness of vision, double vision or watery eyes Ears, nose, mouth, throat, and face: Denies mucositis or sore throat Respiratory: Denies cough, dyspnea or wheezes Cardiovascular: Denies palpitation, chest discomfort or lower extremity swelling Gastrointestinal:  Denies nausea, heartburn or change in bowel habits Skin: Denies abnormal skin rashes Lymphatics: Denies new lymphadenopathy or easy bruising Neurological:Denies numbness, tingling or new weaknesses Behavioral/Psych: Mood is stable, no new changes  All other systems were reviewed with the patient and are negative.  PHYSICAL EXAMINATION: ECOG PERFORMANCE STATUS: 2 BP (!) 121/53 (BP Location: Left Arm, Patient Position: Sitting)   Pulse 93    Temp 98.5 F (36.9 C) (Oral)   Resp 18   Ht 5' 5"  (1.651 m)   Wt 135 lb 6.4 oz (61.4 kg)   SpO2 100%   BMI 22.53 kg/m   GENERAL:alert, no distress and comfortable SKIN: skin color, texture, turgor are normal, no rashes or significant lesions EYES: normal, conjunctiva are pink and non-injected, sclera clear OROPHARYNX:no exudate, no erythema and lips, buccal mucosa, and tongue normal  NECK: supple, thyroid normal size, non-tender, without nodularity LYMPH:  no palpable lymphadenopathy in the cervical, axillary or inguinal LUNGS: clear to auscultation and percussion with normal breathing effort except decreased breath sound at the left lung base HEART: regular rate & rhythm and no murmurs and no lower extremity edema ABDOMEN:abdomen soft, non-tender and normal bowel sounds, J tube in place. (+) Hepatomegaly, liver is palpable 2-3 cm below the rib cage, nontender. Musculoskeletal:no cyanosis of digits and no clubbing  PSYCH: alert & oriented x 3 with fluent speech NEURO: no focal motor/sensory deficits  LABORATORY DATA:  I have reviewed the data as listed CBC Latest Ref Rng & Units 08/09/2016 06/08/2016 05/04/2016  WBC 4.0 - 10.3 10e3/uL 8.6 7.5 9.1  Hemoglobin 13.0 - 17.1 g/dL 12.5(L) 13.5 11.6(L)  Hematocrit 38.4 - 49.9 % 35.3(L) 40.6 35.3(L)  Platelets 140 - 400 10e3/uL 140 240 316   CMP Latest Ref Rng & Units 08/09/2016 06/08/2016 05/04/2016  Glucose 70 - 140 mg/dl 136 99 126(H)  BUN 7.0 - 26.0 mg/dL 18.1 14.3 14  Creatinine 0.7 - 1.3 mg/dL 0.8 0.7 0.49(L)  Sodium 136 - 145 mEq/L 140 140 137  Potassium 3.5 - 5.1 mEq/L 4.6 4.2 4.3  Chloride 101 - 111 mmol/L - - 102  CO2 22 - 29 mEq/L 25 27 27   Calcium 8.4 - 10.4 mg/dL 9.8 9.6 9.0  Total Protein 6.4 - 8.3 g/dL 7.1 6.8 -  Total Bilirubin 0.20 - 1.20 mg/dL 2.33(H) 0.46 -  Alkaline Phos 40 - 150 U/L 213(H) 138 -  AST 5 - 34 U/L 50(H) 20 -  ALT 0 - 55 U/L 20 17 -    Pathology report Diagnosis 08/02/16 PLEURAL FLUID, LEFT  (SPECIMEN 1 OF 1 COLLECTED 08/02/16): REACTIVE MESOTHELIAL CELLS PRESENT. LYMPHOCYTES PRESENT.  Diagnosis 11/23/2015 Mass, lower third of the esophagus, mucosal biopsy Mucinous adenocarcinoma, moderately differentiated.  HER 2 (-)   Diagnosis 04/23/2016 1. Lymph node, biopsy, Cervical - ONE OF ONE LYMPH NODES NEGATIVE FOR CARCINOMA (  0/1). 2. Lymph node, biopsy, Periesophageal - BLOOD WITH SCANT BENIGN SOFT TISSUE AND SKELETAL MUSCLE. - NO MALIGNANCY IDENTIFIED. 3. Esophagogastrectomy - ULCERATION WITH INFLAMMATION AND REACTIVE CHANGES. - FOCAL INTESTINAL METAPLASIA. - CHANGES CONSISTENT WITH TREATMENT EFFECT. - SEVEN OF SEVEN LYMPH NODES NEGATIVE FOR CARCINOMA (0/7). - INCIDENTAL LEIOMYOMA, 0.2 CM. - MARGINS ARE NEGATIVE FOR DYSPLASIA OR MALIGNANCY. - SEE ONCOLOGY TABLE. Microscopic Comment 3. ESOPHAGUS: Specimen: Esophagus and proximal stomach, cervical lymph node. Procedure: Esophagogastrectomy and cervical lymph node biopsy. Tumor Site: Presumed GE junction, see comment. Relationship of Tumor to esophagogastric junction: Can not be assessed. Distance of tumor center from esophagogastric junction: Can not be assessed. Tumor Size Greatest dimension: No residual tumor present. Histologic Type: Per medical record adenocarcinoma (no biopsy for review). Histologic Grade: N/A. Microscopic Tumor Extension: N/A. Margins: Negative. Treatment Effect: Present (TRS 0). Lymph-Vascular Invasion: Not identified. Perineural Invasion: Not identified. Lymph nodes: number examined 8; number positive: 0 TNM: ypT0, ypN0 see comment. 1 of 3 FINAL for CARNEY, [place] ([id number]) Microscopic Comment(continued) Ancillary studies: None. Comments: The entire GE junction is submitted for evaluation. There is ulceration with associated inflammation. There is acellular mucin/myxoid changes which extend through the muscularis propria, but no viable/residual tumor is identified and thus the stage is  ypT0. There is focal background intestinal metaplasia, which may have been pre-existing (Barrett's esophagus) or related to therapy.   RADIOGRAPHIC STUDIES: I have personally reviewed the radiological images as listed and agreed with the findings in the report. Dg Chest 1 View  Result Date: 08/02/2016 CLINICAL DATA:  Cough. EXAM: CHEST 1 VIEW COMPARISON:  07/26/2016 . FINDINGS: Mediastinum hilar structures are normal. Left lower lobe subsegmental atelectasis and or infiltrate. Small left pleural effusion. No pneumothorax. Bony lesions involving the left posterior sixth seventh and eighth ribs again cannot be excluded. These are better demonstrated on prior study. Catheter noted over the left upper abdomen. IMPRESSION: Left lower lobe subsegmental atelectasis and or infiltrate. Small left pleural effusion. Findings have improved slightly from prior study of 07/26/2016. Electronically Signed   By: Marcello Moores  Register   On: 08/02/2016 10:20   Dg Chest 2 View  Result Date: 07/26/2016 CLINICAL DATA:  Esophageal carcinoma EXAM: CHEST  2 VIEW COMPARISON:  July 04, 2016 FINDINGS: There is a moderate pleural effusion on the left with consolidation left lower lobe and inferior lingula. Right lung is clear. Heart size and pulmonary vascularity are normal. No adenopathy. Air is noted throughout much of the esophagus. There is cortical irregularity in portions of the posterior left sixth, seventh, and eighth ribs, concerning for potential metastatic foci in these areas. IMPRESSION: Moderate left pleural effusion with left base and inferior lingular consolidation. Right lung clear. Stable cardiac silhouette. Note that there is extensive air throughout much of the esophagus. Concern for bony metastases in the left posterior sixth, seventh, and eighth ribs. Electronically Signed   By: Lowella Grip III M.D.   On: 07/26/2016 09:44   US Thoracentesis Asp Pleural Space W/img Guide  Result Date:  08/02/2016 INDICATION: Patient with history of esophageal cancer, recurrent left pleural effusion. Request made for diagnostic and therapeutic left thoracentesis. EXAM: ULTRASOUND GUIDED DIAGNOSTIC AND THERAPEUTIC LEFT THORACENTESIS MEDICATIONS: None. COMPLICATIONS: None immediate. PROCEDURE: An ultrasound guided thoracentesis was thoroughly discussed with the patient and questions answered. The benefits, risks, alternatives and complications were also discussed. The patient understands and wishes to proceed with the procedure. Written consent was obtained. Ultrasound was performed to localize and mark an adequate pocket of fluid  in the left chest. The area was then prepped and draped in the normal sterile fashion. 1% Lidocaine was used for local anesthesia. Under ultrasound guidance a Safe-T-Centesis catheter was introduced. Thoracentesis was performed. The catheter was removed and a dressing applied. FINDINGS: A total of approximately 900 cc of yellow fluid was removed. Samples were sent to the laboratory as requested by the clinical team. IMPRESSION: Successful ultrasound guided diagnostic and therapeutic left thoracentesis yielding 900 cc of pleural fluid. Read by: Rowe Robert, PA-C Electronically Signed   By: Aletta Edouard M.D.   On: 08/02/2016 10:07   EGD by Dr. Alice Reichert 12/20/2015 Impression: -A 6 cm mass was found in the lower third of the esophagus. Multiple biopsies were performed. -A sliding small sized hiatal hernia otherwise normal mucosa in stomach into duodenum.  PET scan 12/05/2015 ([facility]) Impression #1 distal esophagus which she'll hypermetabolic mass consistent with known malignancy #2 hypermetabolic activity in the left anterior prostate gland, correlate for malignancy #3 metabolic density left adrenal nodule. CT attenuation suggest a benign adrenal adenoma which can be moderately hypermetabolic. However given history of malignancy, metastatic disease is not excluded. #4 left apical 2  mm pulmonary nodule, too small for evaluation by PET.  EUS at [facility] 12/14/2015: Impressions: Mass was noted from 27 cm to 35 cm from the in sisters. By EUS criteria and the lesion was T3, there was 1-2 metastatic regional lymph nodes.  ASSESSMENT & PLAN:  76 y.o. male presented with dysphagia with solid food  1. Distal esophageal adenocarcinoma, uT3N1M0, stage IIIA, ypT0N0M0 -I previously reviewed his CT scan, PET scan, EGD, and the biopsy findings with patient and his family member in details. -The previous PET scan showed no definitive distant metastasis. 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 This will be followed in the future scan. -By EUS, he had T3 N1 stage III disease. -He completed neoadjuvant radiation with weekly Carbo and Taxol. -I previously reviewed his surgical pathology findings, he has had complete pathologic response to neoadjuvant chemotherapy and radiation, which predicts good prognosis -He recovered well from surgery, he is still on tube feeding at night, but has been eating well, will continue supportive care. -His recent CXR showed questionable left rib lesion, he denies any chest pain. -Lab review, he has elevated alkaline phosphatase, mild elevated total bilirubin and AST, he has hepatomegaly on physical exam. I recommend to have a PET scan to ruled out bone and liver metastasis. He agrees, will try to get it done in the next two weeks  -His CEA level from today is still pending   2. 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 Cytology was negative -Continue monitoring  3. HTN -Continue medication and follow-up of his primary care physician  4. Malnutrition and weight loss -Doing better, he is tolerating J-tube feeding very well, he eats normally  -Follow up with dietitian  5. Depression  -Continue mirtazapine   Plan -PET scan within two weeks, I'll see him after  his PET scan.  All questions were answered. The patient knows to call the clinic with any problems, questions or concerns.  I spent 20 minutes counseling the patient face to face. The total time spent in the appointment was 25 minutes and more than 50% was on counseling.    Truitt Merle, MD 08/09/2016

## 2016-08-09 ENCOUNTER — Other Ambulatory Visit (HOSPITAL_BASED_OUTPATIENT_CLINIC_OR_DEPARTMENT_OTHER): Payer: Non-veteran care

## 2016-08-09 ENCOUNTER — Encounter: Payer: Self-pay | Admitting: Hematology

## 2016-08-09 ENCOUNTER — Ambulatory Visit (HOSPITAL_BASED_OUTPATIENT_CLINIC_OR_DEPARTMENT_OTHER): Payer: Non-veteran care | Admitting: Hematology

## 2016-08-09 ENCOUNTER — Telehealth: Payer: Self-pay | Admitting: Hematology

## 2016-08-09 VITALS — BP 121/53 | HR 93 | Temp 98.5°F | Resp 18 | Ht 65.0 in | Wt 135.4 lb

## 2016-08-09 DIAGNOSIS — C155 Malignant neoplasm of lower third of esophagus: Secondary | ICD-10-CM

## 2016-08-09 DIAGNOSIS — E46 Unspecified protein-calorie malnutrition: Secondary | ICD-10-CM | POA: Diagnosis not present

## 2016-08-09 DIAGNOSIS — J90 Pleural effusion, not elsewhere classified: Secondary | ICD-10-CM | POA: Diagnosis not present

## 2016-08-09 DIAGNOSIS — I10 Essential (primary) hypertension: Secondary | ICD-10-CM | POA: Diagnosis not present

## 2016-08-09 LAB — CBC WITH DIFFERENTIAL/PLATELET
BASO%: 0.8 % (ref 0.0–2.0)
Basophils Absolute: 0.1 10*3/uL (ref 0.0–0.1)
EOS%: 0.9 % (ref 0.0–7.0)
Eosinophils Absolute: 0.1 10*3/uL (ref 0.0–0.5)
HEMATOCRIT: 35.3 % — AB (ref 38.4–49.9)
HEMOGLOBIN: 12.5 g/dL — AB (ref 13.0–17.1)
LYMPH#: 1.1 10*3/uL (ref 0.9–3.3)
LYMPH%: 12.8 % — ABNORMAL LOW (ref 14.0–49.0)
MCH: 31.4 pg (ref 27.2–33.4)
MCHC: 35.2 g/dL (ref 32.0–36.0)
MCV: 89.2 fL (ref 79.3–98.0)
MONO#: 0.6 10*3/uL (ref 0.1–0.9)
MONO%: 7.5 % (ref 0.0–14.0)
NEUT%: 78 % — ABNORMAL HIGH (ref 39.0–75.0)
NEUTROS ABS: 6.7 10*3/uL — AB (ref 1.5–6.5)
Platelets: 140 10*3/uL (ref 140–400)
RBC: 3.96 10*6/uL — ABNORMAL LOW (ref 4.20–5.82)
RDW: 17.1 % — ABNORMAL HIGH (ref 11.0–14.6)
WBC: 8.6 10*3/uL (ref 4.0–10.3)

## 2016-08-09 LAB — COMPREHENSIVE METABOLIC PANEL
ALT: 20 U/L (ref 0–55)
AST: 50 U/L — AB (ref 5–34)
Albumin: 4 g/dL (ref 3.5–5.0)
Alkaline Phosphatase: 213 U/L — ABNORMAL HIGH (ref 40–150)
Anion Gap: 11 mEq/L (ref 3–11)
BUN: 18.1 mg/dL (ref 7.0–26.0)
CALCIUM: 9.8 mg/dL (ref 8.4–10.4)
CHLORIDE: 104 meq/L (ref 98–109)
CO2: 25 mEq/L (ref 22–29)
CREATININE: 0.8 mg/dL (ref 0.7–1.3)
EGFR: 88 mL/min/{1.73_m2} — ABNORMAL LOW (ref >90)
Glucose: 136 mg/dl (ref 70–140)
Potassium: 4.6 mEq/L (ref 3.5–5.1)
Sodium: 140 mEq/L (ref 136–145)
TOTAL PROTEIN: 7.1 g/dL (ref 6.4–8.3)
Total Bilirubin: 2.33 mg/dL — ABNORMAL HIGH (ref 0.20–1.20)

## 2016-08-09 LAB — TECHNOLOGIST REVIEW

## 2016-08-09 NOTE — Telephone Encounter (Signed)
Gave patient avs report and appointment for February.central radiology will call re scan.

## 2016-08-10 LAB — CEA

## 2016-08-10 LAB — CEA (IN HOUSE-CHCC)

## 2016-08-13 ENCOUNTER — Telehealth: Payer: Self-pay | Admitting: *Deleted

## 2016-08-13 ENCOUNTER — Ambulatory Visit: Payer: No Typology Code available for payment source | Admitting: Nutrition

## 2016-08-13 DIAGNOSIS — C155 Malignant neoplasm of lower third of esophagus: Secondary | ICD-10-CM

## 2016-08-13 MED ORDER — OSMOLITE 1.5 CAL PO LIQD: ORAL | 0 refills | Status: DC

## 2016-08-13 NOTE — Telephone Encounter (Signed)
Called wife back to report the PET scan has been authorized and rescheduled for 08/14/16 at 0630/0700 at [facility]. She needs to stop his tube feedings at 12MN tonight. Suggested she hook up his feeding now and stop it at midnight. Dr. Burr Medico notified and will get him in this week as well.

## 2016-08-13 NOTE — Telephone Encounter (Signed)
Oncology Nurse Navigator Documentation  Oncology Nurse Navigator Flowsheets 08/13/2016  Navigator Location [facility]  Referral date to RadOnc/MedOnc -  Navigator Encounter Type Telephone  Telephone Incoming Call;Outgoing Call;Symptom Mgt--not eating. Gets nausea with just few bites. Sees him deteriorate. Asking for the double bags that provided him 60 ml flush every hour while he was having his feeding at night.  Abnormal Finding Date -  Confirmed Diagnosis Date -  Surgery Date -  Treatment Initiated Date -  Patient Visit Type -  Treatment Phase Abnormal Scans  Barriers/Navigation Needs Coordination of Care;Family concerns  Education Symptom Management--informed her that dietician will re-write his feeding orders with the flushes and call [facility] with change and to get supplies to his home.  Interventions Coordination of Care;Referrals  Referrals Nutrition/dietician  Coordination of Care Radiology  Education Method Verbal  Support Groups/Services -  Specialty Items/DME PEG care supplies  Acuity Level 2  Time Spent with Patient 30  Call to radiology to request PET 2/1 be moved up to this week per MD order. Lea in radiology will call back with date/time they can work him in. Sent message to managed care to please get PA asap since scan is being moved to this week.

## 2016-08-13 NOTE — Progress Notes (Signed)
Received a telephone call from GI navigator requesting updated tube feeding orders for patient s/p treatment for esophageal cancer.  Per MD note on 08/09/16: His recent CXR showed questionable left rib lesion, he denies any chest pain. -Lab review, he has elevated alkaline phosphatase, mild elevated total bilirubin and AST, he has hepatomegaly on physical exam. I recommend to have a PET scan to ruled out bone and liver metastasis. He agrees, will try to get it done in the next two weeks   -His CEA level increased to 2382 from 28.5 8 months ago.  I contacted patient by phone and spoke with both patient and wife. He has a jejunostomy feeding tube in which he has been using approximately 3 cans Osmolite 1.5 overnight. Wife reports patient does not tolerate a rate greater than 75 mL an hour. Patient is utilizing Osmolite 1.5 with 140 cc free water flushes before and after continuous feedings. Patient reports he can eat very small amounts of food during the day and sips of water.  However, he has frequent vomiting with oral intake. Current weight documented as 135 pounds. Patient is requesting tube feeding supplies using [facility].  Estimated nutrition needs: 2030-2230 calories, 85-100 grams protein, 2.2 L fluid.  Nutrition diagnosis: Unintended weight loss has improved.  Intervention: Recommended patient increase Osmolite 1.5-5 cans daily.  Patient will need to utilize continuous feeding pump for 16 hours daily at 75 mL an hour. Recommended patient continue to flush feeding tube with 140 mL twice a day before and after continuous feedings. In addition patient will flush or drink 600 cc free water daily when pump is not running Encouraged patient to consume high-protein, bland foods and liquids in very small amounts.   Recommend medication for nausea as patient is taking Dramamine.  Per wife, this seems to help.  5 cans Osmolite 1.5 plus free water flushes provides 1775 kcal, 74.5 gm  pro, 1785 mL free water.  Monitoring, evaluation, goals:  Patient will tolerate increased continuous tube feedings to prevent weight loss. May need to increase TF to 6 cans daily if patient unable to tolerate oral intake.  Next visit: I will follow-up by phone.  **Disclaimer: This note was dictated with voice recognition software. Similar sounding words can inadvertently be transcribed and this note may contain transcription errors which may not have been corrected upon publication of note.**

## 2016-08-14 ENCOUNTER — Encounter (HOSPITAL_COMMUNITY)
Admission: RE | Admit: 2016-08-14 | Discharge: 2016-08-14 | Disposition: A | Discharge status: Home / Self Care | Payer: Non-veteran care | Source: Ambulatory Visit | Attending: Hematology | Admitting: Hematology

## 2016-08-14 DIAGNOSIS — E279 Disorder of adrenal gland, unspecified: Secondary | ICD-10-CM | POA: Insufficient documentation

## 2016-08-14 DIAGNOSIS — J90 Pleural effusion, not elsewhere classified: Secondary | ICD-10-CM | POA: Insufficient documentation

## 2016-08-14 DIAGNOSIS — C155 Malignant neoplasm of lower third of esophagus: Secondary | ICD-10-CM | POA: Diagnosis present

## 2016-08-14 DIAGNOSIS — N400 Enlarged prostate without lower urinary tract symptoms: Secondary | ICD-10-CM | POA: Diagnosis not present

## 2016-08-14 DIAGNOSIS — J9811 Atelectasis: Secondary | ICD-10-CM | POA: Diagnosis not present

## 2016-08-14 LAB — GLUCOSE, CAPILLARY: Glucose-Capillary: 206 mg/dL — ABNORMAL HIGH (ref 65–99)

## 2016-08-14 MED ORDER — FLUDEOXYGLUCOSE F - 18 (FDG) INJECTION
6.6100 | Freq: Once | INTRAVENOUS | Status: AC | PRN
  Administered 2016-08-14: 6.61 via INTRAVENOUS

## 2016-08-15 ENCOUNTER — Telehealth: Payer: Self-pay | Admitting: Hematology

## 2016-08-15 ENCOUNTER — Ambulatory Visit (HOSPITAL_BASED_OUTPATIENT_CLINIC_OR_DEPARTMENT_OTHER): Payer: No Typology Code available for payment source | Admitting: Hematology

## 2016-08-15 ENCOUNTER — Encounter: Payer: Self-pay | Admitting: Hematology

## 2016-08-15 ENCOUNTER — Other Ambulatory Visit: Payer: Self-pay | Admitting: *Deleted

## 2016-08-15 ENCOUNTER — Ambulatory Visit (HOSPITAL_BASED_OUTPATIENT_CLINIC_OR_DEPARTMENT_OTHER): Payer: No Typology Code available for payment source

## 2016-08-15 ENCOUNTER — Telehealth: Payer: Self-pay | Admitting: *Deleted

## 2016-08-15 ENCOUNTER — Ambulatory Visit (HOSPITAL_COMMUNITY)
Admission: RE | Admit: 2016-08-15 | Discharge: 2016-08-15 | Disposition: A | Discharge status: Home / Self Care | Payer: Medicare Other | Source: Ambulatory Visit | Attending: Hematology | Admitting: Hematology

## 2016-08-15 VITALS — BP 110/50 | HR 115 | Temp 98.4°F | Resp 148 | Ht 65.0 in | Wt 133.7 lb

## 2016-08-15 DIAGNOSIS — J90 Pleural effusion, not elsewhere classified: Secondary | ICD-10-CM | POA: Diagnosis not present

## 2016-08-15 DIAGNOSIS — D696 Thrombocytopenia, unspecified: Secondary | ICD-10-CM

## 2016-08-15 DIAGNOSIS — D62 Acute posthemorrhagic anemia: Secondary | ICD-10-CM

## 2016-08-15 DIAGNOSIS — C155 Malignant neoplasm of lower third of esophagus: Secondary | ICD-10-CM

## 2016-08-15 DIAGNOSIS — D649 Anemia, unspecified: Secondary | ICD-10-CM | POA: Diagnosis not present

## 2016-08-15 DIAGNOSIS — R000 Tachycardia, unspecified: Secondary | ICD-10-CM

## 2016-08-15 LAB — COMPREHENSIVE METABOLIC PANEL
ALBUMIN: 3.8 g/dL (ref 3.5–5.0)
ALK PHOS: 381 U/L — AB (ref 40–150)
ALT: 43 U/L (ref 0–55)
AST: 138 U/L — ABNORMAL HIGH (ref 5–34)
Anion Gap: 12 mEq/L — ABNORMAL HIGH (ref 3–11)
BUN: 30.1 mg/dL — ABNORMAL HIGH (ref 7.0–26.0)
CALCIUM: 9.4 mg/dL (ref 8.4–10.4)
CO2: 23 mEq/L (ref 22–29)
Chloride: 102 mEq/L (ref 98–109)
Creatinine: 0.7 mg/dL (ref 0.7–1.3)
EGFR: 89 mL/min/{1.73_m2} — AB (ref >90)
GLUCOSE: 137 mg/dL (ref 70–140)
POTASSIUM: 4.8 meq/L (ref 3.5–5.1)
SODIUM: 137 meq/L (ref 136–145)
Total Bilirubin: 2.21 mg/dL — ABNORMAL HIGH (ref 0.20–1.20)
Total Protein: 6.8 g/dL (ref 6.4–8.3)

## 2016-08-15 LAB — CBC WITH DIFFERENTIAL/PLATELET
BASO%: 1.3 % (ref 0.0–2.0)
BASOS ABS: 0.1 10*3/uL (ref 0.0–0.1)
EOS%: 0.4 % (ref 0.0–7.0)
Eosinophils Absolute: 0 10*3/uL (ref 0.0–0.5)
HEMATOCRIT: 21.1 % — AB (ref 38.4–49.9)
HEMOGLOBIN: 7.4 g/dL — AB (ref 13.0–17.1)
LYMPH#: 1.2 10*3/uL (ref 0.9–3.3)
LYMPH%: 13.3 % — ABNORMAL LOW (ref 14.0–49.0)
MCH: 31.1 pg (ref 27.2–33.4)
MCHC: 35.1 g/dL (ref 32.0–36.0)
MCV: 88.7 fL (ref 79.3–98.0)
MONO#: 1.1 10*3/uL — AB (ref 0.1–0.9)
MONO%: 11.9 % (ref 0.0–14.0)
NEUT#: 6.6 10*3/uL — ABNORMAL HIGH (ref 1.5–6.5)
NEUT%: 73.1 % (ref 39.0–75.0)
NRBC: 9 % — AB (ref 0–0)
PLATELETS: 78 10*3/uL — AB (ref 140–400)
RBC: 2.38 10*6/uL — ABNORMAL LOW (ref 4.20–5.82)
RDW: 19.8 % — AB (ref 11.0–14.6)
WBC: 9.1 10*3/uL (ref 4.0–10.3)

## 2016-08-15 LAB — URINALYSIS, MICROSCOPIC - CHCC
BLOOD: NEGATIVE
Bilirubin (Urine): NEGATIVE
Glucose: NEGATIVE mg/dL
KETONES: NEGATIVE mg/dL
LEUKOCYTE ESTERASE: NEGATIVE
Nitrite: NEGATIVE
Protein: 30 mg/dL
SPECIFIC GRAVITY, URINE: 1.01 (ref 1.003–1.035)
Urobilinogen, UR: 0.2 mg/dL (ref 0.2–1)
pH: 6 (ref 4.6–8.0)

## 2016-08-15 LAB — TECHNOLOGIST REVIEW

## 2016-08-15 MED ORDER — ONDANSETRON 8 MG PO TBDP: 8.0000 mg | ORAL_TABLET | Freq: Three times a day (TID) | ORAL | 2 refills | Status: AC | PRN

## 2016-08-15 MED ORDER — MIRTAZAPINE 15 MG PO TABS: 15.0000 mg | ORAL_TABLET | Freq: Every day | ORAL | 2 refills | Status: DC

## 2016-08-15 NOTE — Progress Notes (Signed)
Called for HAR with Chuck/Admitting.

## 2016-08-15 NOTE — Telephone Encounter (Signed)
sw pt to confirm 1/24 appt at 10 am per LOS

## 2016-08-15 NOTE — Progress Notes (Addendum)
[place]  [phone] [phone]  Clinic Follow up Note   Patient Care Team: [name], MD as PCP - General (Internal Medicine) Truitt Merle, MD as Consulting Physician (Hematology) Kyung Rudd, MD as Consulting Physician (Radiation Oncology) [name], MD as Consulting Physician (Cardiothoracic Surgery) Karie Mainland, RD as Dietitian (Nutrition) Minus Breeding, MD as Consulting Physician (Cardiology) Tania Ade, RN as Registered Nurse   CHIEF COMPLAINTS:  Follow up esophageal adenocarcinoma  Oncology History   Presented with solid dysphagia at least daily with chest pain. Involuntary weight loss of 10-12 lb over past year  Malignant neoplasm of lower third of esophagus (HCC)   Staging form: Esophagus - Adenocarcinoma, AJCC 7th Edition   - Clinical stage from 12/15/2015: Stage IIIA (T3, N1, M0) - Signed by Truitt Merle, MD on 12/24/2015   - Pathologic stage from 04/26/2016: yT0, N0, cM0, GX - Signed by [name], MD on 04/27/2016      Malignant neoplasm of lower third of esophagus ([place])   11/23/2015 Procedure    EGD per [facility](Dr. [place]):6cm mass in lower third of esophagus      11/24/2015 Initial Diagnosis    Esophageal cancer ([place])      11/24/2015 Pathology Results    Mucinous adenocarcinoma, moderately differentiated-Her2 analysis pending      12/05/2015 Imaging    PET scan showed hypermetabolic a mass in distal esophagus, hypermetabolic activity in the left anterior prostate gland, metabolic density in left adrenal nodule, CT attenuation suggest a benign adrenal adenoma. Left apical 2 mm lung nodule.      12/15/2015 Procedure    EUS showed a uT3N1 lesion from 27cm to 35cm from the incisors       12/26/2015 - 02/02/2016 Chemotherapy    Weekly carboplatin AUC 2 and Taxol 45 mg/m, with concurrent radiation      12/26/2015 - 02/02/2016 Radiation Therapy    Neoadjuvant chemoradiation to the  esophageal cancer      04/23/2016 Surgery    total esophagectomy with cervical esophagogastrostomy, pyloroplasty, feeding jejunostomy       04/23/2016 Pathology Results    Esophagectomy showed no residual tumor, 8 lymph nodes were all negative, incidental leiomyoma 0.2 cm      08/14/2016 PET scan    IMPRESSION: 1. Small hypermetabolic left adrenal mass, this has not changed in size compared to 12/01/15 but has a maximum standard uptake value of 6.2 (previously 4.2). Indeterminate density and MRI characteristics, not specific for adenoma. Early metastatic disease is not readily excluded although the lack of change of size is somewhat reassuring. Surveillance recommended. 2. No other hypermetabolic lesions are identified. 3. Moderate to large left pleural effusion with passive atelectasis. 4. Esophagectomy with gastric pull-through. 5. No hypermetabolic liver lesions are seen. 6. Enlarged prostate gland.       HISTORY OF PRESENTING ILLNESS (12/14/2015):  Fred [name] 76 y.o. male is here because of His newly diagnosed esophageal cancer. He is accompanied by his wife and daughter to my clinic today.  He has had dysphagia for 2 months, mainly with solid food, no dysphagia with soft food or liquid. He also has mild pain in mid chest when he swallows. No nausea, abdominal pain, or other discomfort. He has lost about 15 lbs in the last year, no other symoptoms. He was seen by his primary care physician at [facility], and referred to gastroenterologist Dr. Alice Reichert. He underwent EGD on 11/23/2015, which showed a 6 cm mass  in the lower third of esophagus. Biopsy showed adenocarcinoma, moderately differentiated. He was referred to [place] to consider neoadjuvant therapy. He is scheduled to see a Psychologist, sport and exercise at [facility] later this week.  He has had some urinary frequency, underwent TURP on 11/02/2015.   CURRENT THERAPY: Surveillance  INTERIM HISTORY: Mr. Montoya returns for follow up and PET scan  review. He is accompanied by several family members. The patient presents in wheelchair. He reports vomiting more since our last visit. He threw up four times yesterday though it was mostly phlegm versus food. He has been tube feeding. He reports nausea sometimes before vomiting. He has not been sick this morning. He reports no appetite. He denies fever. He reports right hip and back pain - his wife notes he started complaining of back pain and right hip pain about three days ago. He has been taking tylenol which helps. He still has 5 mg oxy at home, though he has not taken any. His wife worries the oxy may make it difficult for him to use the bathroom. His urine has been darker in color over the last few days. He has not been drinking or eating much. He denies dysuria or hematuria. The patient has been less active lately, his family states he practically lives in his reclining chair. They also ask if this could be contributing to his back pain. The patient denies feeling depressed, however his wife feels that he is. He is agreeable to starting an antidepressant. Patient states he has been checked for hepatitis at the [place]. He did not have any blood transfusion during surgery. He was able to take 6 cans, 1.5 liter, since yesterday.    MEDICAL HISTORY:  Past Medical History:  Diagnosis Date  . Esophageal cancer ([place]) 11/23/15   lower 3rd esohagus   . GERD (gastroesophageal reflux disease)   . Hypertension     SURGICAL HISTORY: Past Surgical History:  Procedure Laterality Date  . CHEST TUBE INSERTION Left 04/23/2016   Procedure: CHEST TUBE INSERTION;  Surgeon: [name], MD;  Location: [place];  Service: Thoracic;  Laterality: Left;  . COMPLETE ESOPHAGECTOMY N/A 04/23/2016   Procedure: TRANSHIATIAL TOTAL ESOPHAGECTOMY COMPLETE; CERVICAL ESOPHAGOGASTROSTOMY AND PYLOROPLASTY;  Surgeon: [name], MD;  Location: [place];  Service: Thoracic;  Laterality: N/A;  . cystoscope  4/12/1   prostate  .  ERCP    . INGUINAL HERNIA REPAIR    . JEJUNOSTOMY N/A 04/23/2016   Procedure: FEEDING JEJUNOSTOMY;  Surgeon: [name], MD;  Location: [place];  Service: Thoracic;  Laterality: N/A;  . LEG SURGERY Left    hole  . PROSTATE BIOPSY  10/05/15  . right shoulder surgery    . VIDEO BRONCHOSCOPY N/A 04/23/2016   Procedure: VIDEO BRONCHOSCOPY;  Surgeon: [name], MD;  Location: [facility];  Service: Thoracic;  Laterality: N/A;    SOCIAL HISTORY: Social History   Social History  . Marital status: Married    Spouse name: N/A  . Number of children: 1  . Years of education: N/A   Occupational History  . Truck Geophysicist/field seismologist    Social History Main Topics  . Smoking status: Former Smoker    Packs/day: 1.00    Years: 10.00    Quit date: 07/24/1967  . Smokeless tobacco: Never Used  . Alcohol use No  . Drug use: No  . Sexual activity: Not Currently   Other Topics Concern  . Not on file   Social History Narrative  .  No narrative on file    FAMILY HISTORY: Family History  Problem Relation Age of Onset  . Cancer Maternal Grandfather 77    gastric cancer   . Alzheimer's disease Mother   . Diabetes Mother   . Stroke Father 62    ALLERGIES:  is allergic to no known allergies.  MEDICATIONS:  Current Outpatient Prescriptions  Medication Sig Dispense Refill  . atenolol (TENORMIN) 25 MG tablet Take 1 tab in the morning and half tab in the evening 60 tablet 1  . Nutritional Supplements (FEEDING SUPPLEMENT, OSMOLITE 1.5 CAL,) LIQD Increase Osmolite 1.5 to 75 mL/hr for 16 hours daily with 140 mL water before and after continuous feeding. Flush or drink additional 600 mL free water daily. Send formula and bags. 1185 mL 0  . simvastatin (ZOCOR) 20 MG tablet Take 20 mg by mouth daily.    . fluticasone (FLONASE) 50 MCG/ACT nasal spray Place 1 spray into both nostrils daily as needed for allergies or rhinitis.    . mirtazapine (REMERON) 15 MG tablet Take 1 tablet (15 mg total) by mouth at  bedtime. 30 tablet 2  . ondansetron (ZOFRAN ODT) 8 MG disintegrating tablet Take 1 tablet (8 mg total) by mouth every 8 (eight) hours as needed for nausea or vomiting. 30 tablet 2  . oxyCODONE (ROXICODONE) 5 MG/5ML solution 5 mLs (5 mg total) by Per J Tube route every 6 (six) hours as needed for severe pain. (Patient not taking: Reported on 08/09/2016) 150 mL 0   No current facility-administered medications for this visit.    Facility-Administered Medications Ordered in Other Visits  Medication Dose Route Frequency Provider Last Rate Last Dose  . 0.9 %  sodium chloride infusion   Intravenous Once Truitt Merle, MD        REVIEW OF SYSTEMS:   Constitutional: Denies fevers, chills or abnormal night sweats, (+) very fatigued, weight loss, dark colored urine Eyes: Denies blurriness of vision, double vision or watery eyes Ears, nose, mouth, throat, and face: Denies mucositis or sore throat Respiratory: Denies cough, dyspnea or wheezes Cardiovascular: Denies palpitation, chest discomfort or lower extremity swelling Gastrointestinal:  Denies heartburn or change in bowel habits (+) nausea and vomiting Skin: Denies abnormal skin rashes Lymphatics: Denies new lymphadenopathy or easy bruising Neurological:Denies numbness, tingling or new weaknesses Behavioral/Psych: Mood is stable, no new changes  All other systems were reviewed with the patient and are negative.  PHYSICAL EXAMINATION: ECOG PERFORMANCE STATUS: 3 BP (!) 110/50 (BP Location: Left Arm, Patient Position: Sitting) Comment: made nurse aware of low BP  Pulse (!) 115 Comment: made nurse aware of elevated pulse.  Temp 98.4 F (36.9 C) (Oral)   Resp (!) 148   Ht 5' 5"  (1.651 m)   Wt 133 lb 11.2 oz (60.6 kg)   SpO2 100%   BMI 22.25 kg/m   GENERAL:alert, no distress and comfortable, in wheelchair SKIN: skin color, texture, turgor are normal, no rashes or significant lesions EYES: normal, conjunctiva are pink and non-injected, sclera  clear OROPHARYNX:no exudate, no erythema and lips, buccal mucosa, and tongue normal  NECK: supple, thyroid normal size, non-tender, without nodularity LYMPH:  no palpable lymphadenopathy in the cervical, axillary or inguinal LUNGS: clear to auscultation and percussion with normal breathing effort except decreased breath sound at the left lung base HEART: regular rate & rhythm and no murmurs and no lower extremity edema ABDOMEN:abdomen soft, non-tender and normal bowel sounds, J tube in place. (+) Hepatomegaly, liver is palpable 2-3 cm below  the rib cage, nontender. Musculoskeletal:no cyanosis of digits and no clubbing  PSYCH: alert & oriented x 3 with fluent speech NEURO: no focal motor/sensory deficits  LABORATORY DATA:  I have reviewed the data as listed CBC Latest Ref Rng & Units 08/15/2016 08/09/2016 06/08/2016  WBC 4.0 - 10.3 10e3/uL 9.1 8.6 7.5  Hemoglobin 13.0 - 17.1 g/dL 7.4(L) 12.5(L) 13.5  Hematocrit 38.4 - 49.9 % 21.1(L) 35.3(L) 40.6  Platelets 140 - 400 10e3/uL 78(L) 140 240   CMP Latest Ref Rng & Units 08/15/2016 08/09/2016 06/08/2016  Glucose 70 - 140 mg/dl 137 136 99  BUN 7.0 - 26.0 mg/dL 30.1(H) 18.1 14.3  Creatinine 0.7 - 1.3 mg/dL 0.7 0.8 0.7  Sodium 136 - 145 mEq/L 137 140 140  Potassium 3.5 - 5.1 mEq/L 4.8 4.6 4.2  Chloride 101 - 111 mmol/L - - -  CO2 22 - 29 mEq/L 23 25 27   Calcium 8.4 - 10.4 mg/dL 9.4 9.8 9.6  Total Protein 6.4 - 8.3 g/dL 6.8 7.1 6.8  Total Bilirubin 0.20 - 1.20 mg/dL 2.21(H) 2.33(H) 0.46  Alkaline Phos 40 - 150 U/L 381(H) 213(H) 138  AST 5 - 34 U/L 138(H) 50(H) 20  ALT 0 - 55 U/L 43 20 17   Results for SANUEL, LADNIER (MRN [id number])   Ref. Range 12/14/2015 14:55 12/14/2015 16:52 01/23/2016 08:51 08/09/2016 13:04  CEA Latest Ref Range: 0.0 - 4.7 ng/mL 28.5 (H)   2,382.0 (H)  CEA ([facility]) Latest Ref Range: 0.00 - 5.00 ng/mL    1,731.10 x 2 dilu... (H)    Pathology report Diagnosis 08/02/16 PLEURAL FLUID, LEFT (SPECIMEN 1 OF 1 COLLECTED  08/02/16): REACTIVE MESOTHELIAL CELLS PRESENT. LYMPHOCYTES PRESENT.  Diagnosis 11/23/2015 Mass, lower third of the esophagus, mucosal biopsy Mucinous adenocarcinoma, moderately differentiated.  HER 2 (-)   Diagnosis 04/23/2016 1. Lymph node, biopsy, Cervical - ONE OF ONE LYMPH NODES NEGATIVE FOR CARCINOMA (0/1). 2. Lymph node, biopsy, Periesophageal - BLOOD WITH SCANT BENIGN SOFT TISSUE AND SKELETAL MUSCLE. - NO MALIGNANCY IDENTIFIED. 3. Esophagogastrectomy - ULCERATION WITH INFLAMMATION AND REACTIVE CHANGES. - FOCAL INTESTINAL METAPLASIA. - CHANGES CONSISTENT WITH TREATMENT EFFECT. - SEVEN OF SEVEN LYMPH NODES NEGATIVE FOR CARCINOMA (0/7). - INCIDENTAL LEIOMYOMA, 0.2 CM. - MARGINS ARE NEGATIVE FOR DYSPLASIA OR MALIGNANCY. - SEE ONCOLOGY TABLE. Microscopic Comment 3. ESOPHAGUS: Specimen: Esophagus and proximal stomach, cervical lymph node. Procedure: Esophagogastrectomy and cervical lymph node biopsy. Tumor Site: Presumed GE junction, see comment. Relationship of Tumor to esophagogastric junction: Can not be assessed. Distance of tumor center from esophagogastric junction: Can not be assessed. Tumor Size Greatest dimension: No residual tumor present. Histologic Type: Per medical record adenocarcinoma (no biopsy for review). Histologic Grade: N/A. Microscopic Tumor Extension: N/A. Margins: Negative. Treatment Effect: Present (TRS 0). Lymph-Vascular Invasion: Not identified. Perineural Invasion: Not identified. Lymph nodes: number examined 8; number positive: 0 TNM: ypT0, ypN0 see comment. 1 of 3 FINAL for NIKLAUS, MAMARIL ([id number]) Microscopic Comment(continued) Ancillary studies: None. Comments: The entire GE junction is submitted for evaluation. There is ulceration with associated inflammation. There is acellular mucin/myxoid changes which extend through the muscularis propria, but no viable/residual tumor is identified and thus the stage is ypT0. There is focal  background intestinal metaplasia, which may have been pre-existing (Barrett's esophagus) or related to therapy.   RADIOGRAPHIC STUDIES: I have personally reviewed the radiological images as listed and agreed with the findings in the report. Dg Chest 1 View  Result Date: 08/02/2016 CLINICAL DATA:  Cough. EXAM: CHEST 1  VIEW COMPARISON:  07/26/2016 . FINDINGS: Mediastinum hilar structures are normal. Left lower lobe subsegmental atelectasis and or infiltrate. Small left pleural effusion. No pneumothorax. Bony lesions involving the left posterior sixth seventh and eighth ribs again cannot be excluded. These are better demonstrated on prior study. Catheter noted over the left upper abdomen. IMPRESSION: Left lower lobe subsegmental atelectasis and or infiltrate. Small left pleural effusion. Findings have improved slightly from prior study of 07/26/2016. Electronically Signed   By: Marcello Moores  Register   On: 08/02/2016 10:20   Dg Chest 2 View  Result Date: 07/26/2016 CLINICAL DATA:  Esophageal carcinoma EXAM: CHEST  2 VIEW COMPARISON:  July 04, 2016 FINDINGS: There is a moderate pleural effusion on the left with consolidation left lower lobe and inferior lingula. Right lung is clear. Heart size and pulmonary vascularity are normal. No adenopathy. Air is noted throughout much of the esophagus. There is cortical irregularity in portions of the posterior left sixth, seventh, and eighth ribs, concerning for potential metastatic foci in these areas. IMPRESSION: Moderate left pleural effusion with left base and inferior lingular consolidation. Right lung clear. Stable cardiac silhouette. Note that there is extensive air throughout much of the esophagus. Concern for bony metastases in the left posterior sixth, seventh, and eighth ribs. Electronically Signed   By: Lowella Grip III M.D.   On: 07/26/2016 09:44   Nm Pet Image Restag (ps) Skull Base To Thigh  Result Date: 08/14/2016 CLINICAL DATA:  Subsequent  treatment strategy for lower esophageal malignancy. EXAM: NUCLEAR MEDICINE PET SKULL BASE TO THIGH TECHNIQUE: 6.6 mCi F-18 FDG was injected intravenously. Full-ring PET imaging was performed from the skull base to thigh after the radiotracer. CT data was obtained and used for attenuation correction and anatomic localization. FASTING BLOOD GLUCOSE:  Value: 206 mg/dl COMPARISON:  12/05/2015 FINDINGS: NECK No hypermetabolic lymph nodes in the neck. CHEST Esophagectomy with gastric pull-through. Moderate to large left pleural effusion with passive atelectasis. No hypermetabolic activity to suggest recurrent or metastatic disease in the chest at this time. ABDOMEN/PELVIS Physiologic activity in the bowel. No metastatic lesion in the liver is identified. A left adrenal mass previously measured 1.4 by 1.7 cm on 12/01/2015 and currently measures 1.4 by 1.6 cm on image 109/4. Internal density 15 Hounsfield units, appearance not specific for adenoma on prior MRI from 04/10/2016. This small mass has hypermetabolic activity, maximum SUV 6.2, previously 4.2 back on 12/05/2015. Bilateral photopenic renal cysts. Left-sided percutaneous jejunostomy. Enlarged prostate gland. There is evidence of bilateral inguinal hernia repairs in the past. No hypermetabolic liver lesions. SKELETON No focal hypermetabolic activity to suggest skeletal metastasis. IMPRESSION: 1. Small hypermetabolic left adrenal mass, this has not changed in size compared to 12/01/15 but has a maximum standard uptake value of 6.2 (previously 4.2). Indeterminate density and MRI characteristics, not specific for adenoma. Early metastatic disease is not readily excluded although the lack of change of size is somewhat reassuring. Surveillance recommended. 2. No other hypermetabolic lesions are identified. 3. Moderate to large left pleural effusion with passive atelectasis. 4. Esophagectomy with gastric pull-through. 5. No hypermetabolic liver lesions are seen. 6. Enlarged  prostate gland. Electronically Signed   By: Van Clines M.D.   On: 08/14/2016 09:46   US Thoracentesis Asp Pleural Space W/img Guide  Result Date: 08/02/2016 INDICATION: Patient with history of esophageal cancer, recurrent left pleural effusion. Request made for diagnostic and therapeutic left thoracentesis. EXAM: ULTRASOUND GUIDED DIAGNOSTIC AND THERAPEUTIC LEFT THORACENTESIS MEDICATIONS: None. COMPLICATIONS: None immediate. PROCEDURE: An ultrasound guided  thoracentesis was thoroughly discussed with the patient and questions answered. The benefits, risks, alternatives and complications were also discussed. The patient understands and wishes to proceed with the procedure. Written consent was obtained. Ultrasound was performed to localize and mark an adequate pocket of fluid in the left chest. The area was then prepped and draped in the normal sterile fashion. 1% Lidocaine was used for local anesthesia. Under ultrasound guidance a Safe-T-Centesis catheter was introduced. Thoracentesis was performed. The catheter was removed and a dressing applied. FINDINGS: A total of approximately 900 cc of yellow fluid was removed. Samples were sent to the laboratory as requested by the clinical team. IMPRESSION: Successful ultrasound guided diagnostic and therapeutic left thoracentesis yielding 900 cc of pleural fluid. Read by: Rowe Robert, PA-C Electronically Signed   By: Aletta Edouard M.D.   On: 08/02/2016 10:07   EGD by Dr. Alice Reichert 12/20/2015 Impression: -A 6 cm mass was found in the lower third of the esophagus. Multiple biopsies were performed. -A sliding small sized hiatal hernia otherwise normal mucosa in stomach into duodenum.  PET scan 12/05/2015 (outside) Impression #1 distal esophagus which she'll hypermetabolic mass consistent with known malignancy #2 hypermetabolic activity in the left anterior prostate gland, correlate for malignancy #3 metabolic density left adrenal nodule. CT attenuation  suggest a benign adrenal adenoma which can be moderately hypermetabolic. However given history of malignancy, metastatic disease is not excluded. #4 left apical 2 mm pulmonary nodule, too small for evaluation by PET.  E[facility] at [facility] 12/14/2015: Impressions: Mass was noted from 27 cm to 35 cm from the in sisters. By EUS criteria and the lesion was T3, there was 1-2 metastatic regional lymph nodes.  PET 08/14/2016 IMPRESSION: 1. Small hypermetabolic left adrenal mass, this has not changed in size compared to 12/01/15 but has a maximum standard uptake value of 6.2 (previously 4.2). Indeterminate density and MRI characteristics, not specific for adenoma. Early metastatic disease is not readily excluded although the lack of change of size is somewhat reassuring. Surveillance recommended. 2. No other hypermetabolic lesions are identified. 3. Moderate to large left pleural effusion with passive atelectasis. 4. Esophagectomy with gastric pull-through. 5. No hypermetabolic liver lesions are seen. 6. Enlarged prostate gland.  ASSESSMENT & PLAN:  76 y.o. male presented with dysphagia with solid food  1. Distal esophageal adenocarcinoma, uT3N1M0, stage IIIA, ypT0N0M0 -I previously reviewed his CT scan, PET scan, EGD, and the biopsy findings with patient and his family member in details. -The previous PET scan showed no definitive distant metastasis. 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 This will be followed in the future scan. -By EUS, he had T3 N1 stage III disease. -He completed neoadjuvant radiation with weekly Carbo and Taxol. -I previously reviewed his surgical pathology findings, he has had complete pathologic response to neoadjuvant chemotherapy and radiation, which predicts good prognosis -He recovered well from surgery, he is still on tube feeding at night, but has been eating well, will continue supportive care. -His recent  CXR showed questionable left rib lesion, he denies any chest pain. -He has developed significant fatigue, anorexia, nausea, elevated liver enzymes and significantly increased CEA level, this is very suspicious for recurrence of esophageal cancer. -I reviewed his recent PET with pt and his family. It showed small hypermetabolic left adrenal mass which has not changed in size compared to 12/01/15 with a maximum standard uptake value of 6.2 (previously 4.2). There are no other hypermetabolic lesions identified. Liver  was unremarkable on the CT and PET. I reviewed his image with radiologist personally. -Repeat his lab today showed significant new onset anemia with hemoglobin 7.4, thrombocytopenia, and slightly worsening of transaminitis, stable elevated total bilirubin. I recommend patient to be admitted to hospital for further workup, patient wants to get the approval for his admission, which may take a few days. -I offered iVF today, he declined  2. Worsening anemia and new thrombocytopenia -Etiology unknown. He does not have overt bleeding. PET scan was done yesterday, no internal bleeding also. -I'll obtain lab test to ruled out DIC and hemolysis -2 units blood transfusion tomorrow  3. Transaminitis and hyperbilirubinemia -he has mild hepatomegaly, worsening transaminitis in the past one week, and slightly elevated bilirubin -Patient states he was tested for appetite B and C in the past, which were negative -No significant biliary dilatation, no clinical or radiological suspicious for cholecystitis.  -Patient has no fever or artificial white count, no clinical suspicions for infection, but I'll draw a set of blood culture and urine culture -May need GI consult.   4. 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 Cytology was negative -Continue monitoring  5. HTN -Continue medication and follow-up of his primary care physician  6. Malnutrition, nausea/vomiting, and  weight loss -Doing better, he is tolerating J-tube feeding very well, he eats normally  -Follow up with dietitian -I have refilled zofran today.   7. Anorexia and depression  -Begin mirtazapine. I have filled today.  8. New onset right hip pain and back pain  -This pain began about three days ago. -Resolved by tylenol. I advised the patient not to take more than three tylenol daily due to his liver issue  -PET scan from yesterday showed no hypermetabolic bone lesions.  9. Tachycardia -Pulse 115 today, likely secondary to dehydration. -The patient was offered fluids today, but is not interested at this time.   Plan -Recent PET reviewed.  -Labs will be drawn after our visit today, including a blood culture, and urinalysis.  -I will discuss with GI and may set up referral, referral needs [facility] approval. -I have refilled zofran today and written prescription for mirtazapine. -He is scheduled to see Dr. Servando Snare on 08/30/2016. I will send him a note about the patient's case.  -He will return for follow up within the next 2 weeks.   All questions were answered. The patient knows to call the clinic with any problems, questions or concerns.  I spent 30 minutes counseling the patient face to face. The total time spent in the appointment was 40 minutes and more than 50% was on counseling.  This document serves as a record of services personally performed by Truitt Merle, MD. It was created on her behalf by Arlyce Harman, a trained medical scribe. The creation of this record is based on the scribe's personal observations and the provider's statements to them. This document has been checked and approved by the attending provider.   Truitt Merle, MD 08/15/2016   Addendum,  His lab returned after his visit (see my note above), called pt, arranged further blood tests to rule out DIC and hemolysis and blood transfusion tomorrow.Pt does not want to be admitted unless it's approved by [place].   Truitt Merle   08/15/2016   Addendum  Pt came in today for blood transfusion, had low-grade fever with T100, no chills or other new symptoms. He received 2 units blood transfusion with premedication Tylenol and Benadryl. I ordered additional labs, some results  are still pending, but on the initial impression is hemolytic anemia, I reviewed his peripheral blood smear, which showed left shift, with increased myelocytes and metamyelocyte, most consistent with bone marrow reaction to hemolysis any anemia. There is occasional schistocytes, 0-2/hp. DIC panel result is pending.   He is also getting a abd [place] to evaluate his gall bladder and liver due to his abnormal LFTs.   I will admit pt to [facility] today, I spoke with Hospitalist Dr. Algis Liming who kindly agreed to admit him. I suggested broad antibiotics when cultures are pending. I will follow up tomorrow. I will hold on steroids unless coombs' test is positive.   Truitt Merle  08/16/2016 5:26 PM

## 2016-08-15 NOTE — Telephone Encounter (Signed)
Labs appointment was added for today, per 08/15/16 los.  Patient was given a copy of the appointment schedule and AVS report, per 08/15/16 los.

## 2016-08-15 NOTE — Telephone Encounter (Signed)
Called pt to discuss admission per Dr Burr Medico due to HGB down tremendously since last count 6 days ago.  Pt & wife concerned about admitting due to needing approval with [facility].  They want to go ahead with blood transfusion instead.  Discussed this with Dr Burr Medico & OK to do transfusion.  Also called [facility] & left message with Misty @ [facility] /Nurse Navigator & message is that she will f/u within 24 hours.  Orders placed for lab & blood transfusion Misty called back & she can be reached at [phone] ext [phone].  She reports that pt can be admitted although he wouldn't have a auth # up front & couldn't get auth until admitting & d/c date is known.  Someone would have to call the d/c date to [facility] & then we would get auth # at d/c. At this point, Pt will get blood transfusion 1st & if Dr Burr Medico decides on admission tomorrow & pt is agreeable, we will arrange.

## 2016-08-16 ENCOUNTER — Other Ambulatory Visit (HOSPITAL_BASED_OUTPATIENT_CLINIC_OR_DEPARTMENT_OTHER): Payer: No Typology Code available for payment source

## 2016-08-16 ENCOUNTER — Telehealth: Payer: Self-pay | Admitting: *Deleted

## 2016-08-16 ENCOUNTER — Ambulatory Visit (HOSPITAL_COMMUNITY)
Admission: RE | Admit: 2016-08-16 | Discharge: 2016-08-16 | Disposition: A | Discharge status: Home / Self Care | Payer: No Typology Code available for payment source | Source: Ambulatory Visit | Attending: Hematology | Admitting: Hematology

## 2016-08-16 ENCOUNTER — Ambulatory Visit (HOSPITAL_BASED_OUTPATIENT_CLINIC_OR_DEPARTMENT_OTHER): Payer: No Typology Code available for payment source

## 2016-08-16 ENCOUNTER — Encounter (HOSPITAL_COMMUNITY): Payer: Self-pay | Admitting: Orthopedic Surgery

## 2016-08-16 ENCOUNTER — Other Ambulatory Visit: Payer: Self-pay | Admitting: *Deleted

## 2016-08-16 ENCOUNTER — Inpatient Hospital Stay (HOSPITAL_COMMUNITY)
Admission: AD | Admit: 2016-08-16 | Discharge: 2016-08-23 | DRG: 813 | Disposition: A | Discharge status: Home / Self Care | Payer: Non-veteran care | Source: Ambulatory Visit | Attending: Internal Medicine | Admitting: Internal Medicine

## 2016-08-16 ENCOUNTER — Inpatient Hospital Stay (HOSPITAL_COMMUNITY): Payer: Non-veteran care

## 2016-08-16 DIAGNOSIS — E785 Hyperlipidemia, unspecified: Secondary | ICD-10-CM | POA: Diagnosis present

## 2016-08-16 DIAGNOSIS — N281 Cyst of kidney, acquired: Secondary | ICD-10-CM | POA: Insufficient documentation

## 2016-08-16 DIAGNOSIS — J90 Pleural effusion, not elsewhere classified: Secondary | ICD-10-CM | POA: Diagnosis present

## 2016-08-16 DIAGNOSIS — I359 Nonrheumatic aortic valve disorder, unspecified: Secondary | ICD-10-CM | POA: Diagnosis not present

## 2016-08-16 DIAGNOSIS — Z934 Other artificial openings of gastrointestinal tract status: Secondary | ICD-10-CM | POA: Diagnosis not present

## 2016-08-16 DIAGNOSIS — E43 Unspecified severe protein-calorie malnutrition: Secondary | ICD-10-CM | POA: Diagnosis not present

## 2016-08-16 DIAGNOSIS — D696 Thrombocytopenia, unspecified: Secondary | ICD-10-CM | POA: Diagnosis present

## 2016-08-16 DIAGNOSIS — Z79899 Other long term (current) drug therapy: Secondary | ICD-10-CM

## 2016-08-16 DIAGNOSIS — D599 Acquired hemolytic anemia, unspecified: Secondary | ICD-10-CM | POA: Diagnosis present

## 2016-08-16 DIAGNOSIS — D649 Anemia, unspecified: Secondary | ICD-10-CM | POA: Diagnosis present

## 2016-08-16 DIAGNOSIS — C155 Malignant neoplasm of lower third of esophagus: Secondary | ICD-10-CM

## 2016-08-16 DIAGNOSIS — D589 Hereditary hemolytic anemia, unspecified: Secondary | ICD-10-CM | POA: Diagnosis not present

## 2016-08-16 DIAGNOSIS — Z9221 Personal history of antineoplastic chemotherapy: Secondary | ICD-10-CM | POA: Diagnosis not present

## 2016-08-16 DIAGNOSIS — Z6822 Body mass index (BMI) 22.0-22.9, adult: Secondary | ICD-10-CM

## 2016-08-16 DIAGNOSIS — D62 Acute posthemorrhagic anemia: Secondary | ICD-10-CM | POA: Diagnosis not present

## 2016-08-16 DIAGNOSIS — R195 Other fecal abnormalities: Secondary | ICD-10-CM

## 2016-08-16 DIAGNOSIS — E440 Moderate protein-calorie malnutrition: Secondary | ICD-10-CM | POA: Diagnosis present

## 2016-08-16 DIAGNOSIS — D65 Disseminated intravascular coagulation [defibrination syndrome]: Secondary | ICD-10-CM | POA: Diagnosis present

## 2016-08-16 DIAGNOSIS — Z87891 Personal history of nicotine dependence: Secondary | ICD-10-CM | POA: Diagnosis not present

## 2016-08-16 DIAGNOSIS — Z8501 Personal history of malignant neoplasm of esophagus: Secondary | ICD-10-CM

## 2016-08-16 DIAGNOSIS — E46 Unspecified protein-calorie malnutrition: Secondary | ICD-10-CM | POA: Diagnosis present

## 2016-08-16 DIAGNOSIS — I10 Essential (primary) hypertension: Secondary | ICD-10-CM | POA: Diagnosis present

## 2016-08-16 DIAGNOSIS — R079 Chest pain, unspecified: Secondary | ICD-10-CM | POA: Diagnosis not present

## 2016-08-16 DIAGNOSIS — R531 Weakness: Secondary | ICD-10-CM | POA: Diagnosis present

## 2016-08-16 DIAGNOSIS — R509 Fever, unspecified: Secondary | ICD-10-CM | POA: Diagnosis present

## 2016-08-16 DIAGNOSIS — Z923 Personal history of irradiation: Secondary | ICD-10-CM | POA: Diagnosis not present

## 2016-08-16 DIAGNOSIS — D598 Other acquired hemolytic anemias: Secondary | ICD-10-CM

## 2016-08-16 DIAGNOSIS — R0602 Shortness of breath: Secondary | ICD-10-CM | POA: Diagnosis not present

## 2016-08-16 DIAGNOSIS — Z9889 Other specified postprocedural states: Secondary | ICD-10-CM

## 2016-08-16 HISTORY — DX: Hyperlipidemia, unspecified: E78.5

## 2016-08-16 LAB — CBC WITH DIFFERENTIAL/PLATELET
BASOS ABS: 0.1 10*3/uL (ref 0.0–0.1)
Basophils Relative: 1 %
EOS ABS: 0.1 10*3/uL (ref 0.0–0.7)
Eosinophils Relative: 1 %
HCT: 25.7 % — ABNORMAL LOW (ref 39.0–52.0)
Hemoglobin: 8.9 g/dL — ABNORMAL LOW (ref 13.0–17.0)
LYMPHS ABS: 1.2 10*3/uL (ref 0.7–4.0)
LYMPHS PCT: 18 %
MCH: 29.9 pg (ref 26.0–34.0)
MCHC: 34.6 g/dL (ref 30.0–36.0)
MCV: 86.2 fL (ref 78.0–100.0)
Monocytes Absolute: 0.7 10*3/uL (ref 0.1–1.0)
Monocytes Relative: 11 %
NEUTROS ABS: 4.5 10*3/uL (ref 1.7–7.7)
Neutrophils Relative %: 69 %
Platelets: 64 10*3/uL — ABNORMAL LOW (ref 150–400)
RBC: 2.98 MIL/uL — ABNORMAL LOW (ref 4.22–5.81)
RDW: 17 % — AB (ref 11.5–15.5)
WBC MORPHOLOGY: INCREASED
WBC: 6.6 10*3/uL (ref 4.0–10.5)

## 2016-08-16 LAB — CBC & DIFF AND RETIC
BASO%: 0.8 % (ref 0.0–2.0)
Basophils Absolute: 0.1 10*3/uL (ref 0.0–0.1)
EOS ABS: 0 10*3/uL (ref 0.0–0.5)
EOS%: 0.3 % (ref 0.0–7.0)
HCT: 18.9 % — ABNORMAL LOW (ref 38.4–49.9)
HEMOGLOBIN: 6.5 g/dL — AB (ref 13.0–17.1)
IMMATURE RETIC FRACT: 27.6 % — AB (ref 3.00–10.60)
LYMPH%: 12.3 % — ABNORMAL LOW (ref 14.0–49.0)
MCH: 30.7 pg (ref 27.2–33.4)
MCHC: 34.4 g/dL (ref 32.0–36.0)
MCV: 89.2 fL (ref 79.3–98.0)
MONO#: 1 10*3/uL — ABNORMAL HIGH (ref 0.1–0.9)
MONO%: 12.1 % (ref 0.0–14.0)
NEUT#: 5.9 10*3/uL (ref 1.5–6.5)
NEUT%: 74.5 % (ref 39.0–75.0)
Platelets: 72 10*3/uL — ABNORMAL LOW (ref 140–400)
RBC: 2.12 10*6/uL — ABNORMAL LOW (ref 4.20–5.82)
RDW: 20.1 % — ABNORMAL HIGH (ref 11.0–14.6)
RETIC CT ABS: 293.83 10*3/uL — AB (ref 34.80–93.90)
Retic %: 13.86 % — ABNORMAL HIGH (ref 0.80–1.80)
WBC: 7.9 10*3/uL (ref 4.0–10.3)
lymph#: 1 10*3/uL (ref 0.9–3.3)
nRBC: 10 % — ABNORMAL HIGH (ref 0–0)

## 2016-08-16 LAB — DIRECT ANTIGLOBULIN TEST (NOT AT ARMC)
DAT, COMPLEMENT: NEGATIVE
DAT, IgG: NEGATIVE

## 2016-08-16 LAB — COMPREHENSIVE METABOLIC PANEL
ALT: 55 U/L (ref 0–55)
ANION GAP: 10 meq/L (ref 3–11)
AST: 129 U/L — AB (ref 5–34)
Albumin: 3.6 g/dL (ref 3.5–5.0)
Alkaline Phosphatase: 432 U/L — ABNORMAL HIGH (ref 40–150)
BILIRUBIN TOTAL: 2.16 mg/dL — AB (ref 0.20–1.20)
BUN: 29.4 mg/dL — AB (ref 7.0–26.0)
CO2: 25 meq/L (ref 22–29)
CREATININE: 0.7 mg/dL (ref 0.7–1.3)
Calcium: 9.3 mg/dL (ref 8.4–10.4)
Chloride: 101 mEq/L (ref 98–109)
EGFR: >90 mL/min/{1.73_m2} (ref >90)
Glucose: 171 mg/dl — ABNORMAL HIGH (ref 70–140)
Potassium: 4.9 mEq/L (ref 3.5–5.1)
Sodium: 136 mEq/L (ref 136–145)
Total Protein: 6.7 g/dL (ref 6.4–8.3)

## 2016-08-16 LAB — PREPARE RBC (CROSSMATCH)

## 2016-08-16 LAB — URINE CULTURE

## 2016-08-16 LAB — ABO/RH: ABO/RH(D): O POS

## 2016-08-16 LAB — TECHNOLOGIST REVIEW

## 2016-08-16 LAB — LACTATE DEHYDROGENASE: LDH: 1488 U/L — ABNORMAL HIGH (ref 125–245)

## 2016-08-16 LAB — CHCC SMEAR

## 2016-08-16 MED ORDER — OSMOLITE 1.5 CAL PO LIQD
1000.0000 mL | ORAL | Status: AC
  Administered 2016-08-16: 1000 mL
  Filled 2016-08-16: qty 1000

## 2016-08-16 MED ORDER — SODIUM CHLORIDE 0.9 % IV SOLN
250.0000 mL | Freq: Once | INTRAVENOUS | Status: AC
  Administered 2016-08-16: 250 mL via INTRAVENOUS

## 2016-08-16 MED ORDER — ACETAMINOPHEN 325 MG PO TABS
650.0000 mg | ORAL_TABLET | Freq: Once | ORAL | Status: AC
  Administered 2016-08-16: 650 mg via ORAL

## 2016-08-16 MED ORDER — VANCOMYCIN HCL IN DEXTROSE 1-5 GM/200ML-% IV SOLN
1000.0000 mg | Freq: Once | INTRAVENOUS | Status: AC
  Administered 2016-08-16: 20:00:00 1000 mg via INTRAVENOUS
  Filled 2016-08-16: qty 200

## 2016-08-16 MED ORDER — DEXTROSE 5 % IV SOLN
1.0000 g | Freq: Three times a day (TID) | INTRAVENOUS | Status: DC
  Administered 2016-08-17 – 2016-08-20 (×11): 1 g via INTRAVENOUS
  Filled 2016-08-16 (×13): qty 1

## 2016-08-16 MED ORDER — ACETAMINOPHEN 325 MG PO TABS
ORAL_TABLET | ORAL | Status: AC
  Filled 2016-08-16: qty 2

## 2016-08-16 MED ORDER — DIPHENHYDRAMINE HCL 25 MG PO CAPS
ORAL_CAPSULE | ORAL | Status: AC
  Filled 2016-08-16: qty 1

## 2016-08-16 MED ORDER — DEXTROSE 5 % IV SOLN
1.0000 g | Freq: Once | INTRAVENOUS | Status: AC
  Administered 2016-08-16: 1 g via INTRAVENOUS
  Filled 2016-08-16: qty 1

## 2016-08-16 MED ORDER — FREE WATER
120.0000 mL | Freq: Two times a day (BID) | Status: DC
  Administered 2016-08-16 – 2016-08-23 (×13): 120 mL

## 2016-08-16 MED ORDER — DIPHENHYDRAMINE HCL 25 MG PO CAPS
25.0000 mg | ORAL_CAPSULE | Freq: Once | ORAL | Status: AC
  Administered 2016-08-16: 25 mg via ORAL

## 2016-08-16 MED ORDER — VANCOMYCIN HCL IN DEXTROSE 750-5 MG/150ML-% IV SOLN
750.0000 mg | Freq: Two times a day (BID) | INTRAVENOUS | Status: DC
  Administered 2016-08-17 – 2016-08-20 (×7): 750 mg via INTRAVENOUS
  Filled 2016-08-16 (×8): qty 150

## 2016-08-16 NOTE — Progress Notes (Signed)
Pt arrived from the cancer center, transported by RN, Janifer Adie. Patient not reporting any distress, shortness of breath, chest pain, or dizziness. Patient's wife concerned that because of the late admission, patient won't receive his tube feeding. RN paged admitting doctor to make aware of floor arrival and for admission orders. Will continue to monitor patient.

## 2016-08-16 NOTE — Telephone Encounter (Signed)
Dr Burr Medico talked with Dr Lindaann Pascal re: admission of pt for esophageal cancer & recent anemia-HGB 6. 5 today.  Agrees to admit.   Bed placement notified.  Message left for Darlena/Managed Care.Pt gone for US abdomen & to go to admitting when done.  Pt to go to [facility] per [facility] in admitting.  Pt taken to floor & report given to nurse.@ 6:15 pm

## 2016-08-16 NOTE — Progress Notes (Addendum)
Acceptance note  Patient's primary Oncologist Dr. Burr Medico consulted [facility] for direct admission of this patient from the cancer center to [facility] medical bed.  In summary, patient is a 76 year old male with history of esophageal cancer, status post chemoradiation, status post esophagotomy in October 2017, not currently on chemotherapy, presented to the [facility] with complaints of feeling weak, low-grade fever, seen yesterday also at the cancer center for vomiting, decreased by mouth intake but no reported history of flulike symptoms, cough, dyspnea or diarrhea. As per Dr. Burr Medico, patient is hemodynamically stable, has developed new onset hemolytic anemia, concern for DIC-like picture, all of unclear etiology. He does get J-tube feeding. Recent PET scan negative for cancer recurrence. Blood cultures drawn 1/24 are negative to date. Currently completing 2nd unit PRBC transfusion at the cancer center. Also getting RUQ ultrasound prior to hospital admission. No reported bleeding and she does not recommend platelet transfusion at this time. She is concerned about an infectious process and recommends broad-spectrum antibiotics. Patient also has a left pleural effusion which apparently is stable. Multiple lab work including DIC panel and Coomb's testing is pending. She does not recommend steroids unless Coomb's test is positive. She indicates that she will continue to consult on patient in the hospital. Peripheral smear is yet to be read by her.  Accepted patient to a medical bed, inpatient status.  Vernell Leep, MD, FACP, FHM. [facility] Pager [phone]  If 7PM-7AM, please contact night-coverage [email or web address] Password [email or web address] 08/16/2016, 5:03 PM

## 2016-08-16 NOTE — Patient Instructions (Signed)
Blood Transfusion, Adult, Care After This sheet gives you information about how to care for yourself after your procedure. Your health care provider may also give you more specific instructions. If you have problems or questions, contact your health care provider. What can I expect after the procedure? After your procedure, it is common to have:  Bruising and soreness where the IV tube was inserted.  Headache. Follow these instructions at home:  Take over-the-counter and prescription medicines only as told by your health care provider.  Return to your normal activities as told by your health care provider.  Follow instructions from your health care provider about how to take care of your IV insertion site. Make sure you:  Wash your hands with soap and water before you change your bandage (dressing). If soap and water are not available, use hand sanitizer.  Change your dressing as told by your health care provider.  Check your IV insertion site every day for signs of infection. Check for:  More redness, swelling, or pain.  More fluid or blood.  Warmth.  Pus or a bad smell. Contact a health care provider if:  You have more redness, swelling, or pain around the IV insertion site.  You have more fluid or blood coming from the IV insertion site.  Your IV insertion site feels warm to the touch.  You have pus or a bad smell coming from the IV insertion site.  Your urine turns pink, red, or brown.  You feel weak after doing your normal activities. Get help right away if:  You have signs of a serious allergic or immune system reaction, including:  Itchiness.  Hives.  Trouble breathing.  Anxiety.  Chest or lower back pain.  Fever, flushing, and chills.  Rapid pulse.  Rash.  Diarrhea.  Vomiting.  Dark urine.  Serious headache.  Dizziness.  Stiff neck.  Yellow coloration of the face or the white parts of the eyes (jaundice). This information is not  intended to replace advice given to you by your health care provider. Make sure you discuss any questions you have with your health care provider. Document Released: 07/30/2014 Document Revised: 03/07/2016 Document Reviewed: 01/23/2016 Elsevier Interactive Patient Education  2017 Elsevier Inc.  

## 2016-08-16 NOTE — Addendum Note (Signed)
Addended by: Truitt Merle on: 08/16/2016 09:45 AM   Modules accepted: Orders

## 2016-08-16 NOTE — H&P (Signed)
History and Physical    Fred [name] [id number] DOB: 03-May-1941 DOA: 08/16/2016  PCP: [name], MD   Patient coming from: Home.  Chief Complaint: Fever and weakness.  HPI: Fred [name] is a 76 y.o. male with medical history significant of esophageal cancer, status post chemo and radiation therapy, status post esophagectomy, cervical esophagogastrostomy and pyloroplasty, and J-tube placement, GERD, hyperlipidemia, hypertension who presented to the counseling center with complaints of weakness, low-grade fevers, chills and emesis since yesterday. He denies headaches, sore throat, rhinorrhea, cough, chest pain, abdominal pain, diarrhea, melena or hematochezia. He denies dysuria or frequency.  Per Dr. Burr Medico, the patient developed new hemolytic anemia. His hemoglobin level was 14 g/dL last month and earlier today was 6.4 g/dL. Patient was transfused 2 units of packed RBCs before being transferred to the hospital for further evaluation and treatment. Blood cultures drawn yesterday so far have been negative.  ED Course: Workup earlier today showed WBC of 7.9, hemoglobin of 6.5 g/dL and platelets 72. His chemistry panel shows sodium of 136, potassium 4.9 mmol/L, aBUN was 29, creatinine 0.7 and glucose 171.  Review of Systems: As per HPI otherwise 10 point review of systems negative.    Past Medical History:  Diagnosis Date  . Esophageal cancer ([place]) 11/23/15   lower 3rd esohagus   . GERD (gastroesophageal reflux disease)   . Hyperlipidemia   . Hypertension     Past Surgical History:  Procedure Laterality Date  . CHEST TUBE INSERTION Left 04/23/2016   Procedure: CHEST TUBE INSERTION;  Surgeon: [name], MD;  Location: [place];  Service: Thoracic;  Laterality: Left;  . COMPLETE ESOPHAGECTOMY N/A 04/23/2016   Procedure: TRANSHIATIAL TOTAL ESOPHAGECTOMY COMPLETE; CERVICAL ESOPHAGOGASTROSTOMY AND PYLOROPLASTY;  Surgeon: [name], MD;  Location: [place];  Service: Thoracic;   Laterality: N/A;  . cystoscope  4/12/1   prostate  . ERCP    . INGUINAL HERNIA REPAIR    . JEJUNOSTOMY N/A 04/23/2016   Procedure: FEEDING JEJUNOSTOMY;  Surgeon: [name], MD;  Location: [place];  Service: Thoracic;  Laterality: N/A;  . LEG SURGERY Left    hole  . PROSTATE BIOPSY  10/05/15  . right shoulder surgery    . VIDEO BRONCHOSCOPY N/A 04/23/2016   Procedure: VIDEO BRONCHOSCOPY;  Surgeon: [name], MD;  Location: [place];  Service: Thoracic;  Laterality: N/A;     reports that he quit smoking about 49 years ago. He has a 10.00 pack-year smoking history. He has never used smokeless tobacco. He reports that he does not drink alcohol or use drugs.  Allergies  Allergen Reactions  . No Known Allergies     Family History  Problem Relation Age of Onset  . Cancer Maternal Grandfather 29    gastric cancer   . Alzheimer's disease Mother   . Diabetes Mother   . Stroke Father 49  . Multiple sclerosis Sister     Prior to Admission medications   Medication Sig Start Date End Date Taking? Authorizing Provider  acetaminophen (TYLENOL) 325 MG tablet Take 650 mg by mouth every 6 (six) hours as needed.   Yes Historical Provider, MD  atenolol (TENORMIN) 25 MG tablet Take 1 tab in the morning and half tab in the evening Patient taking differently: Take 12.5-25 mg by mouth 2 (two) times daily. Take 25mg  in the morning and 12.5mg  in the evening 05/24/16  Yes Rhonda G Barrett, PA-C  ondansetron (ZOFRAN ODT) 8 MG disintegrating tablet Take 1 tablet (  8 mg total) by mouth every 8 (eight) hours as needed for nausea or vomiting. 08/15/16  Yes Truitt Merle, MD  simvastatin (ZOCOR) 20 MG tablet Take 20 mg by mouth daily.   Yes Historical Provider, MD  mirtazapine (REMERON) 15 MG tablet Take 1 tablet (15 mg total) by mouth at bedtime. 08/15/16   Truitt Merle, MD  Nutritional Supplements (FEEDING SUPPLEMENT, OSMOLITE 1.5 CAL,) LIQD Increase Osmolite 1.5 to 75 mL/hr for 16 hours daily with 140 mL water  before and after continuous feeding. Flush or drink additional 600 mL free water daily. Send formula and bags. 08/13/16   Truitt Merle, MD  oxyCODONE (ROXICODONE) 5 MG/5ML solution 5 mLs (5 mg total) by Per J Tube route every 6 (six) hours as needed for severe pain. Patient not taking: Reported on 08/09/2016 05/04/16   John Giovanni, PA-C    Physical Exam:  Constitutional: NAD, calm, comfortable Vitals:   08/16/16 1825  BP: (!) 132/56  Pulse: (!) 103  Resp: 18  Temp: 99 F (37.2 C)  TempSrc: Oral  SpO2: 100%   Eyes: PERRL, lids and conjunctivae normal ENMT: Mucous membranes are mildly dry. Posterior pharynx clear of any exudate or lesions. Neck: normal, supple, no masses, no thyromegaly Respiratory: Decreased breath sounds on the left lower lung field, no wheezing, no crackles. Normal respiratory effort. No accessory muscle use.  Cardiovascular: Tachycardic at 104 BPM, no murmurs / rubs / gallops. No extremity edema. 2+ pedal pulses. No carotid bruits.  Abdomen: J-tube in place, soft, no tenderness, no masses palpated. No hepatosplenomegaly. Bowel sounds positive.  Musculoskeletal: no clubbing / cyanosis. Good ROM, no contractures. Normal muscle tone.  Skin: Small ecchymosis areas on extremities Neurologic: CN 2-12 grossly intact. Sensation intact, DTR normal. Strength 5/5 in all 4.  Psychiatric: Normal judgment and insight. Alert and oriented x 3.   Labs on Admission: I have personally reviewed following labs and imaging studies  CBC:  Recent Labs Lab 08/15/16 1230 08/16/16 1206 08/16/16 1854  WBC 9.1 7.9 6.6  NEUTROABS 6.6* 5.9 4.5  HGB 7.4* 6.5* 8.9*  HCT 21.1* 18.9* 25.7*  MCV 88.7 89.2 86.2  PLT 78* 72* 64*   Basic Metabolic Panel:  Recent Labs Lab 08/15/16 1230 08/16/16 1206  NA 137 136  K 4.8 4.9  CO2 23 25  GLUCOSE 137 171*  BUN 30.1* 29.4*  CREATININE 0.7 0.7  CALCIUM 9.4 9.3   GFR: Estimated Creatinine Clearance: 67.3 mL/min (by C-G formula based on SCr  of 0.7 mg/dL). Liver Function Tests:  Recent Labs Lab 08/15/16 1230 08/16/16 1206  AST 138* 129*  ALT 43 55  ALKPHOS 381* 432*  BILITOT 2.21* 2.16*  PROT 6.8 6.7  ALBUMIN 3.8 3.6   No results for input(s): LIPASE, AMYLASE in the last 168 hours. No results for input(s): AMMONIA in the last 168 hours. Coagulation Profile: No results for input(s): INR, PROTIME in the last 168 hours. Cardiac Enzymes: No results for input(s): CKTOTAL, CKMB, CKMBINDEX, TROPONINI in the last 168 hours. BNP (last 3 results) No results for input(s): PROBNP in the last 8760 hours. HbA1C: No results for input(s): HGBA1C in the last 72 hours. CBG:  Recent Labs Lab 08/14/16 0643  GLUCAP 206*   Lipid Profile: No results for input(s): CHOL, HDL, LDLCALC, TRIG, CHOLHDL, LDLDIRECT in the last 72 hours. Thyroid Function Tests: No results for input(s): TSH, T4TOTAL, FREET4, T3FREE, THYROIDAB in the last 72 hours. Anemia Panel:  Recent Labs  08/16/16 1206  RETICCTPCT 13.86*  Urine analysis:    Component Value Date/Time   COLORURINE AMBER (A) 04/20/2016 0926   APPEARANCEUR CLEAR 04/20/2016 0926   LABSPEC 1.010 08/15/2016 1224   PHURINE 6.0 08/15/2016 1224   PHURINE 6.0 04/20/2016 0926   GLUCOSEU Negative 08/15/2016 1224   HGBUR Negative 08/15/2016 1224   HGBUR NEGATIVE 04/20/2016 0926   BILIRUBINUR Negative 08/15/2016 1224   KETONESUR Negative 08/15/2016 1224   KETONESUR NEGATIVE 04/20/2016 0926   PROTEINUR 30 08/15/2016 1224   PROTEINUR 30 (A) 04/20/2016 0926   UROBILINOGEN 0.2 08/15/2016 1224   NITRITE Negative 08/15/2016 1224   NITRITE NEGATIVE 04/20/2016 0926   LEUKOCYTESUR Negative 08/15/2016 1224    Recent Results (from the past 240 hour(s))  TECHNOLOGIST REVIEW     Status: None   Collection Time: 08/09/16  1:04 PM  Result Value Ref Range Status   Technologist Review Metas and Myelocytes present  Final  Urine Culture     Status: None   Collection Time: 08/15/16 12:24 PM    Result Value Ref Range Status   Urine Culture, Routine Final report  Final   Urine Culture result 1 Comment  Final    Comment: Culture shows less than 10,000 colony forming units of bacteria per milliliter of urine. This colony count is not generally considered to be clinically significant.   Culture, blood (single) w Reflex to ID Panel     Status: None (Preliminary result)   Collection Time: 08/15/16 12:24 PM  Result Value Ref Range Status   BLOOD CULTURE, ROUTINE Preliminary report  Preliminary   RESULT 1 Comment  Preliminary    Comment: No growth detected at this time.  TECHNOLOGIST REVIEW     Status: None   Collection Time: 08/15/16 12:30 PM  Result Value Ref Range Status   Technologist Review Metas and Myelocytes present. 9% nrbc. sl poly  Final  TECHNOLOGIST REVIEW     Status: None   Collection Time: 08/16/16 12:06 PM  Result Value Ref Range Status   Technologist Review Metas and Myelocytes present  Final     Radiological Exams on Admission: Dg Chest Port 1 View  Result Date: 08/16/2016 CLINICAL DATA:  76 y/o  M; fever and history of esophageal cancer. EXAM: PORTABLE CHEST 1 VIEW COMPARISON:  08/02/2016 chest radiograph and 08/14/2016 PET-CT. FINDINGS: The heart size and mediastinal contours are within normal limits and stable. Moderate left pleural effusion is increased from prior radiograph with probably stable from prior PET-CT with redistribution to the lung base. Associated left basilar opacity. Otherwise no focal consolidation identified. The visualized skeletal structures are unremarkable. IMPRESSION: Moderate left pleural effusion increased from prior chest radiograph, probably stable from prior chest PET-CT. She fusion of the lung base. Associated left basilar opacity probably represents atelectasis, underline pneumonia is not excluded. Electronically Signed   By: Kristine Garbe M.D.   On: 08/16/2016 19:14      Assessment/Plan Principal Problem:    Fever Admit to inpatient/MedSurg. Continue broad-spectrum IV antibiotics. Follow-up blood cultures and sensitivity.  Active Problems:   Symptomatic anemia Transfuse 2 units of packed RBCs earlier. Coombs test is negative. Monitor hematocrit and hemoglobin and transfuse as needed.    Thrombocytopenia Monitor platelet levels.    HTN (hypertension) Continue atenolol 25 mg daily and 12.5 mg by mouth at bedtime.     Malnutrition ([place]) Continue Osmolite feedings through J-tube.    Esophageal cancer ([place]) Continue follow-up as a schedule by oncology.    Hyperlipidemia Hold simvastatin due to abnormal LFTs.  DVT prophylaxis: SCDs. Code Status: Full code. Family Communication: His wife and daughter were present in the room. Disposition Plan: Admit for IV antibiotic therapy and further evaluation. Consults called: Oncology will be following. Admission status: Inpatient/medSurg   Reubin Milan MD [facility] Pager [phone].  If 7PM-7AM, please contact night-coverage [email or web address] Password [facility]  08/16/2016, 9:31 PM

## 2016-08-16 NOTE — Progress Notes (Signed)
Pharmacy Antibiotic Note  Fred [name] is a 76 y.o. male directly admitted on 08/16/2016 from [facility] with weakness, fever, anemia, and concern for infectious process.  PMH includes esophageal cancer s/p esophagectomy (04/2016) on tube feeds, s/p chemoradiation.  Pharmacy has been consulted for Cefepime, Vancomycin dosing.  Pending:  RUQ ultrasound, CXR, DIC panel, Coomb's test WBC 7.9, Tmax 100 SCr 0.7, CrCl ~ 67 ml/min  Plan:  Cefepime 1g IV q8h  Vancomycin 1g IV x1 then 750 mg IV q12h.  Measure Vanc trough at steady state.  Follow up renal fxn, culture results, and clinical course.      Temp (24hrs), Avg:99.6 F (37.6 C), Min:99.1 F (37.3 C), Max:100 F (37.8 C)   Recent Labs Lab 08/15/16 1230 08/15/16 1230 08/16/16 1206  WBC 9.1  --  7.9  CREATININE  --  0.7 0.7    Estimated Creatinine Clearance: 67.3 mL/min (by C-G formula based on SCr of 0.7 mg/dL).    Allergies  Allergen Reactions  . No Known Allergies     Antimicrobials this admission: 1/25 Vancomycin >>  1/25 Cefepime >>   Dose adjustments this admission:   Microbiology results: 1/24 BCx: ngtd  Thank you for allowing pharmacy to be a part of this patient's care.  Gretta Arab PharmD, BCPS Pager [phone] 08/16/2016 6:35 PM

## 2016-08-16 NOTE — Progress Notes (Signed)
Temp 100.0. Dr. Burr Medico aware. Continue with tx per Dr. Burr Medico. Dr. Burr Medico said that blood could be infused over 1.5 hours. OK to give Premeds with sip of water per Dr. Burr Medico. Second unit transfused. Temp 99.5 at completion of second unit.. Patient wheeled to Radiology for abdominal ultrasound with instructions to return to the Infusion room if they had not heard from Dr. Burr Medico about admission.

## 2016-08-16 NOTE — Addendum Note (Signed)
Addended by: Truitt Merle on: 08/16/2016 04:06 PM   Modules accepted: Orders

## 2016-08-17 ENCOUNTER — Ambulatory Visit (HOSPITAL_COMMUNITY): Payer: No Typology Code available for payment source

## 2016-08-17 DIAGNOSIS — R079 Chest pain, unspecified: Secondary | ICD-10-CM

## 2016-08-17 DIAGNOSIS — D599 Acquired hemolytic anemia, unspecified: Secondary | ICD-10-CM

## 2016-08-17 DIAGNOSIS — E440 Moderate protein-calorie malnutrition: Secondary | ICD-10-CM

## 2016-08-17 DIAGNOSIS — J90 Pleural effusion, not elsewhere classified: Secondary | ICD-10-CM

## 2016-08-17 DIAGNOSIS — D649 Anemia, unspecified: Secondary | ICD-10-CM

## 2016-08-17 DIAGNOSIS — E785 Hyperlipidemia, unspecified: Secondary | ICD-10-CM

## 2016-08-17 DIAGNOSIS — R0602 Shortness of breath: Secondary | ICD-10-CM

## 2016-08-17 LAB — URINALYSIS, ROUTINE W REFLEX MICROSCOPIC
Bacteria, UA: NONE SEEN
Bilirubin Urine: NEGATIVE
GLUCOSE, UA: NEGATIVE mg/dL
HGB URINE DIPSTICK: NEGATIVE
Ketones, ur: NEGATIVE mg/dL
Leukocytes, UA: NEGATIVE
NITRITE: NEGATIVE
PH: 5 (ref 5.0–8.0)
Protein, ur: 30 mg/dL — AB
Specific Gravity, Urine: 1.025 (ref 1.005–1.030)
Squamous Epithelial / LPF: NONE SEEN

## 2016-08-17 LAB — FIBRINOGEN: FIBRINOGEN: 179 mg/dL — AB (ref 193–507)

## 2016-08-17 LAB — CBC WITH DIFFERENTIAL/PLATELET
BASOS PCT: 2 %
Basophils Absolute: 0.1 10*3/uL (ref 0.0–0.1)
EOS ABS: 0 10*3/uL (ref 0.0–0.7)
Eosinophils Relative: 0 %
HCT: 24.9 % — ABNORMAL LOW (ref 39.0–52.0)
HEMOGLOBIN: 8.8 g/dL — AB (ref 13.0–17.0)
LYMPHS ABS: 1.3 10*3/uL (ref 0.7–4.0)
LYMPHS PCT: 21 %
MCH: 30.9 pg (ref 26.0–34.0)
MCHC: 35.3 g/dL (ref 30.0–36.0)
MCV: 87.4 fL (ref 78.0–100.0)
Monocytes Absolute: 0.6 10*3/uL (ref 0.1–1.0)
Monocytes Relative: 9 %
NEUTROS ABS: 4.4 10*3/uL (ref 1.7–7.7)
Neutrophils Relative %: 68 %
PLATELETS: 69 10*3/uL — AB (ref 150–400)
RBC: 2.85 MIL/uL — ABNORMAL LOW (ref 4.22–5.81)
RDW: 18 % — ABNORMAL HIGH (ref 11.5–15.5)
WBC: 6.4 10*3/uL (ref 4.0–10.5)

## 2016-08-17 LAB — COMPREHENSIVE METABOLIC PANEL
ALK PHOS: 346 U/L — AB (ref 38–126)
ALT: 52 U/L (ref 17–63)
ANION GAP: 7 (ref 5–15)
AST: 103 U/L — ABNORMAL HIGH (ref 15–41)
Albumin: 3.4 g/dL — ABNORMAL LOW (ref 3.5–5.0)
BUN: 28 mg/dL — ABNORMAL HIGH (ref 6–20)
CALCIUM: 8.4 mg/dL — AB (ref 8.9–10.3)
CO2: 25 mmol/L (ref 22–32)
CREATININE: 0.53 mg/dL — AB (ref 0.61–1.24)
Chloride: 102 mmol/L (ref 101–111)
GFR calc non Af Amer: >60 mL/min (ref >60)
Glucose, Bld: 223 mg/dL — ABNORMAL HIGH (ref 65–99)
Potassium: 4.2 mmol/L (ref 3.5–5.1)
Sodium: 134 mmol/L — ABNORMAL LOW (ref 135–145)
Total Bilirubin: 1.7 mg/dL — ABNORMAL HIGH (ref 0.3–1.2)
Total Protein: 5.7 g/dL — ABNORMAL LOW (ref 6.5–8.1)

## 2016-08-17 LAB — CBC
HEMATOCRIT: 23.5 % — AB (ref 39.0–52.0)
Hemoglobin: 8.3 g/dL — ABNORMAL LOW (ref 13.0–17.0)
MCH: 30.2 pg (ref 26.0–34.0)
MCHC: 35.3 g/dL (ref 30.0–36.0)
MCV: 85.5 fL (ref 78.0–100.0)
Platelets: 60 10*3/uL — ABNORMAL LOW (ref 150–400)
RBC: 2.75 MIL/uL — AB (ref 4.22–5.81)
RDW: 18.3 % — AB (ref 11.5–15.5)
WBC: 5.2 10*3/uL (ref 4.0–10.5)

## 2016-08-17 LAB — BILIRUBIN, FRACTIONATED(TOT/DIR/INDIR)
BILIRUBIN INDIRECT: 1.22 mg/dL — AB (ref 0.10–0.80)
BILIRUBIN TOTAL: 1.8 mg/dL — AB (ref 0.0–1.2)
BILIRUBIN, DIRECT: 0.58 mg/dL — AB (ref 0.00–0.40)

## 2016-08-17 LAB — VITAMIN B12: Vitamin B-12: 475 pg/mL (ref 180–914)

## 2016-08-17 LAB — HAPTOGLOBIN: Haptoglobin: <10 mg/dL — ABNORMAL LOW (ref 34–200)

## 2016-08-17 LAB — PROTHROMBIN TIME (PT)
INR: 1.2 (ref 0.8–1.2)
Prothrombin Time: 12.9 s — ABNORMAL HIGH (ref 9.1–12.0)

## 2016-08-17 LAB — PROTIME-INR
INR: 1.42
Prothrombin Time: 17.5 seconds — ABNORMAL HIGH (ref 11.4–15.2)

## 2016-08-17 LAB — DIRECT ANTIGLOBULIN TEST (NOT AT ARMC): Coombs', Direct: NEGATIVE

## 2016-08-17 LAB — APTT
APTT: 31 s (ref 24–33)
aPTT: 61 seconds — ABNORMAL HIGH (ref 24–36)

## 2016-08-17 MED ORDER — PANTOPRAZOLE SODIUM 40 MG IV SOLR
40.0000 mg | Freq: Two times a day (BID) | INTRAVENOUS | Status: DC
  Administered 2016-08-17 – 2016-08-19 (×4): 40 mg via INTRAVENOUS
  Filled 2016-08-17 (×4): qty 40

## 2016-08-17 MED ORDER — SIMVASTATIN 20 MG PO TABS: 20.0000 mg | ORAL_TABLET | Freq: Every day | ORAL | Status: DC

## 2016-08-17 MED ORDER — OSMOLITE 1.5 CAL PO LIQD: 1000.0000 mL | ORAL | Status: DC

## 2016-08-17 MED ORDER — OSMOLITE 1.5 CAL PO LIQD
1000.0000 mL | ORAL | Status: DC
  Administered 2016-08-17: 1000 mL
  Filled 2016-08-17 (×2): qty 1000

## 2016-08-17 MED ORDER — ATENOLOL 25 MG PO TABS
25.0000 mg | ORAL_TABLET | Freq: Every day | ORAL | Status: DC
  Administered 2016-08-17 – 2016-08-23 (×7): 25 mg via ORAL
  Filled 2016-08-17 (×7): qty 1

## 2016-08-17 MED ORDER — ATENOLOL 25 MG PO TABS
12.5000 mg | ORAL_TABLET | Freq: Every day | ORAL | Status: DC
  Administered 2016-08-17 – 2016-08-22 (×7): 12.5 mg via ORAL
  Filled 2016-08-17 (×7): qty 1

## 2016-08-17 MED ORDER — ACETAMINOPHEN 325 MG PO TABS
650.0000 mg | ORAL_TABLET | Freq: Four times a day (QID) | ORAL | Status: DC | PRN
  Administered 2016-08-17 – 2016-08-23 (×4): 650 mg via ORAL
  Filled 2016-08-17 (×4): qty 2

## 2016-08-17 MED ORDER — VITAMIN B-12 1000 MCG PO TABS
1000.0000 ug | ORAL_TABLET | Freq: Every day | ORAL | Status: DC
  Administered 2016-08-18 – 2016-08-23 (×6): 1000 ug via ORAL
  Filled 2016-08-17 (×6): qty 1

## 2016-08-17 MED ORDER — FOLIC ACID 1 MG PO TABS
1.0000 mg | ORAL_TABLET | Freq: Every day | ORAL | Status: DC
  Administered 2016-08-18 – 2016-08-23 (×6): 1 mg via ORAL
  Filled 2016-08-17 (×6): qty 1

## 2016-08-17 NOTE — Progress Notes (Addendum)
KELSEY CALVI   DOB:02-Oct-1940   [id number]   [id number]  Hematology and Oncology follow up  Subjective: Patient was admitted from my clinic to [facility] yesterday for severe anemia, and a moderate some cytopenia. He received 2 units of blood transfusion in our cancer center yesterday, hemoglobin has been stable after blood transfusion. He had one bowel movement today, reportedly was dark, no fresh blood. He still has low appetite and fatigue, no significant nausea, MAXIMUM TEMPERATURE 100 this morning. No other new complaints.   Objective:  Vitals:   08/17/16 0959 08/17/16 1150  BP:  126/68  Pulse:  92  Resp:  (!) 22  Temp: 98.1 F (36.7 C) 98.6 F (37 C)    There is no height or weight on file to calculate BMI.  Intake/Output Summary (Last 24 hours) at 08/17/16 1617 Last data filed at 08/17/16 0640  Gross per 24 hour  Intake          1078.75 ml  Output              400 ml  Net           678.75 ml     Sclerae unicteric  Oropharynx clear  No peripheral adenopathy  Lungs clear -- no rales or rhonchi  Heart regular rate and rhythm  Abdomen benign  MSK no focal spinal tenderness, no peripheral edema  Neuro nonfocal   CBG (last 3)  No results for input(s): GLUCAP in the last 72 hours.   Labs:  Lab Results  Component Value Date   WBC 6.4 08/17/2016   HGB 8.8 (L) 08/17/2016   HCT 24.9 (L) 08/17/2016   MCV 87.4 08/17/2016   PLT 69 (L) 08/17/2016   NEUTROABS 4.4 08/17/2016   CMP Latest Ref Rng & Units 08/17/2016 08/16/2016 08/16/2016  Glucose 65 - 99 mg/dL 223(H) 171(H) -  BUN 6 - 20 mg/dL 28(H) 29.4(H) -  Creatinine 0.61 - 1.24 mg/dL 0.53(L) 0.7 -  Sodium 135 - 145 mmol/L 134(L) 136 -  Potassium 3.5 - 5.1 mmol/L 4.2 4.9 -  Chloride 101 - 111 mmol/L 102 - -  CO2 22 - 32 mmol/L 25 25 -  Calcium 8.9 - 10.3 mg/dL 8.4(L) 9.3 -  Total Protein 6.5 - 8.1 g/dL 5.7(L) 6.7 -  Total Bilirubin 0.3 - 1.2 mg/dL 1.7(H) 1.8(H) 2.16(H)  Alkaline Phos 38 - 126 U/L  346(H) 432(H) -  AST 15 - 41 U/L 103(H) 129(H) -  ALT 17 - 63 U/L 52 55 -    Urine Studies No results for input(s): UHGB, CRYS in the last 72 hours.  Invalid input(s): UACOL, UAPR, USPG, UPH, UTP, UGL, UKET, UBIL, UNIT, UROB, ULEU, UEPI, UWBC, URBC, UBAC, CAST, UCOM, [place]  Basic Metabolic Panel:  Recent Labs Lab 08/15/16 1230 08/16/16 1206 08/17/16 0424  NA 137 136 134*  K 4.8 4.9 4.2  CL  --   --  102  CO2 23 25 25   GLUCOSE 137 171* 223*  BUN 30.1* 29.4* 28*  CREATININE 0.7 0.7 0.53*  CALCIUM 9.4 9.3 8.4*   GFR Estimated Creatinine Clearance: 67.3 mL/min (by C-G formula based on SCr of 0.53 mg/dL (L)). Liver Function Tests:  Recent Labs Lab 08/15/16 1230 08/16/16 1206 08/17/16 0424  AST 138* 129* 103*  ALT 43 55 52  ALKPHOS 381* 432* 346*  BILITOT 2.21* 1.8*  2.16* 1.7*  PROT 6.8 6.7 5.7*  ALBUMIN 3.8 3.6 3.4*   No results for input(s): LIPASE, AMYLASE  in the last 168 hours. No results for input(s): AMMONIA in the last 168 hours. Coagulation profile  Recent Labs Lab 08/16/16 1206 08/17/16 0424  INR 1.2 1.42    CBC:  Recent Labs Lab 08/15/16 1230 08/16/16 1206 08/16/16 1854 08/17/16 0424  WBC 9.1 7.9 6.6 6.4  NEUTROABS 6.6* 5.9 4.5 4.4  HGB 7.4* 6.5* 8.9* 8.8*  HCT 21.1* 18.9* 25.7* 24.9*  MCV 88.7 89.2 86.2 87.4  PLT 78* 72* 64* 69*   Cardiac Enzymes: No results for input(s): CKTOTAL, CKMB, CKMBINDEX, TROPONINI in the last 168 hours. BNP: Invalid input(s): POCBNP CBG:  Recent Labs Lab 08/14/16 0643  GLUCAP 206*   D-Dimer No results for input(s): DDIMER in the last 72 hours. Hgb A1c No results for input(s): HGBA1C in the last 72 hours. Lipid Profile No results for input(s): CHOL, HDL, LDLCALC, TRIG, CHOLHDL, LDLDIRECT in the last 72 hours. Thyroid function studies No results for input(s): TSH, T4TOTAL, T3FREE, THYROIDAB in the last 72 hours.  Invalid input(s): FREET3 Anemia work up  Recent Labs  08/16/16 1206  RETICCTPCT  13.86*   Microbiology Recent Results (from the past 240 hour(s))  TECHNOLOGIST REVIEW     Status: None   Collection Time: 08/09/16  1:04 PM  Result Value Ref Range Status   Technologist Review Metas and Myelocytes present  Final  Urine Culture     Status: None   Collection Time: 08/15/16 12:24 PM  Result Value Ref Range Status   Urine Culture, Routine Final report  Final   Urine Culture result 1 Comment  Final    Comment: Culture shows less than 10,000 colony forming units of bacteria per milliliter of urine. This colony count is not generally considered to be clinically significant.   Culture, blood (single) w Reflex to ID Panel     Status: None (Preliminary result)   Collection Time: 08/15/16 12:24 PM  Result Value Ref Range Status   BLOOD CULTURE, ROUTINE Preliminary report  Preliminary   RESULT 1 Comment  Preliminary    Comment: No growth detected at this time.  TECHNOLOGIST REVIEW     Status: None   Collection Time: 08/15/16 12:30 PM  Result Value Ref Range Status   Technologist Review Metas and Myelocytes present. 9% nrbc. sl poly  Final  TECHNOLOGIST REVIEW     Status: None   Collection Time: 08/16/16 12:06 PM  Result Value Ref Range Status   Technologist Review Metas and Myelocytes present  Final      Studies:  US Abdomen Complete  Result Date: 08/17/2016 CLINICAL DATA:  Abnormal liver function. EXAM: ABDOMEN ULTRASOUND COMPLETE COMPARISON:  CT 08/14/2016 FINDINGS: Gallbladder: No gallstones or wall thickening visualized. No sonographic Murphy sign noted by sonographer. Common bile duct: Diameter: Normal caliber, 5 mm Liver: No focal lesion identified. Within normal limits in parenchymal echogenicity. IVC: No abnormality visualized. Pancreas: Visualized portion unremarkable. Spleen: Size and appearance within normal limits. Right Kidney: Length: 11.6 cm. Multiple cysts, the largest 6.1 cm. No hydronephrosis. Normal echotexture. Left Kidney: Length: 10.6 cm. Multiple  cysts, the largest 4.7 cm. No hydronephrosis. Normal echotexture. Abdominal aorta: No aneurysm visualized. Other findings: None. IMPRESSION: No acute findings. Bilateral renal cysts. Electronically Signed   By: Rolm Baptise M.D.   On: 08/17/2016 08:22   Dg Chest Port 1 View  Result Date: 08/16/2016 CLINICAL DATA:  76 y/o  M; fever and history of esophageal cancer. EXAM: PORTABLE CHEST 1 VIEW COMPARISON:  08/02/2016 chest radiograph and 08/14/2016 PET-CT. FINDINGS: The  heart size and mediastinal contours are within normal limits and stable. Moderate left pleural effusion is increased from prior radiograph with probably stable from prior PET-CT with redistribution to the lung base. Associated left basilar opacity. Otherwise no focal consolidation identified. The visualized skeletal structures are unremarkable. IMPRESSION: Moderate left pleural effusion increased from prior chest radiograph, probably stable from prior chest PET-CT. She fusion of the lung base. Associated left basilar opacity probably represents atelectasis, underline pneumonia is not excluded. Electronically Signed   By: Kristine Garbe M.D.   On: 08/16/2016 19:14    Assessment: 76 y.o. with past medical history of distal esophageal adenocarcinoma, stage IIIa, status post concurrent chemotherapy and radiation, and esophagectomy in October 2017, presented with worsening anemia, and mild low-grade fever.  1. 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 Low grade fever, rule out infection, on broad antibiotics  3. History of distal esophagus adenocarcinoma, status post concurrent neoadjuvant chemoirradiation, and esophagectomy, NED on PET 08/14/2016.  4. Elevated AST and bilirubin, likely secondary to hemolysis, no evidence of biliary obstruction or cholecystitis 5. Stable recurrent left pleural effusion 6. Hypertension 7. Malnutrition, anorexia, on tube feeds, OK for oral intake    Plan:   -His lab test and was consistent with hemolysis (significantly elevated LDH, reticulocyte count, indirect bilirubin, low haptoglobin) and DIC. The etiology of DIC is unclear, possibly infection. -I have low suspicion for TTP, but I'll order ADAMTS13 activity to be drawn today. I'll also check B12 level and methylmalonic acid level to ruled out a B12 deficiency -Due to his hemoptysis, I'll start him on the 12 and folic acid tomorrow -We'll need ruled out GI bleeding, we'll check his stool next time, I have ordered ferritin and iron study to be done tomorrow morning -Waiting for culture results. If it remains to be negative, may consider repeat left thoracentesis, to ruled out infection -consider GI consult  -I ordered DIC lab for the next three days, will follow  -blood transfusion if Hg<8.0, plt <15K or active bleeding  -I will ask my partner to see him this weekend, I will be back on Monday   Truitt Merle, MD 08/17/2016  4:17 PM

## 2016-08-17 NOTE — Consult Note (Signed)
Referring Provider: [facility]  Primary Care Physician:  [name], MD Primary Gastroenterologist:  Digestive Diseases - Dr. Alice Reichert  Reason for Consultation:  Anemia, dark stool  ASSESSMENT AND PLAN:   89. 76 yo male with hx of distal esophageal cancer, s/p neoadjuvant chemoradiation followed by a total esophagectomy with cervical esophagogastrostomy, pyloroplasty and feeding jejunostomy in early October.   2. Acute anemia / thrombocytopenia. Hgb 12.5 on 1/18, down to 6.5 yesterday with rise in BUN to 30. Received 2 units of blood yesterday, hgb now 8.8. One dark stool this am. Heme positive light brown secretions in anal vault on exam which could be from jejunostomy and irrelevant.  -Hematology following, feels this is hemolysis and DIC, unclear source but possibly infection. Evaluation in progress. His LDH is high, Tbili is elevated (predominantly indirect).   -No signs of active bleeding at present but will follow along and plan for EGD if indicated.  -continue PPI   3. Feeding difficulties. Still only able to eat small amounts of food. Dependant on enteral feeding.   4. Abnormal LFTs. Hyperbilirubinemia may be from hemolysis but alk phos is 300's? No hypermetabolic liver lesions on PET and abdominal ultrasound is negative.   5. Recurrent left pleural effusion, ?etiology. Cytology negative.   6. Low grade fevers. On antibiotics. Workup in progress   HPI: Fred [name] is a 76 y.o. male diagnosed with moderately differentiated distal esophageal cancer uT3N1M0, stage IIIA, ypT0N0M0 in May.  He had chemoradiation followed by total esophagectomy with cervical esophagogastrostomy, pyloroplasty and feeding jejunostomy in early October. Patient has had ongoing nausea, poor appetite and weight loss. He eats very little, mostly reliant on enteral feedings. He has required thoracentesis x2 for recurrent left pleural effusion. Cytology negative.  Today's cxr shows increased size of  the effusion.   Patient had not had any overt GI bleeding at home. He was constipated for a few days and today finally had a BM which was dark. No abdominal pain. His has been vomiting but per patient and family this has always been associated with vigorous coughing and emesis never bloody or dark but rather a phlegm like substance. No NSAID use. [facility]    Past Medical History:  Diagnosis Date  . Esophageal cancer ([place]) 11/23/15   lower 3rd esohagus   . GERD (gastroesophageal reflux disease)   . Hyperlipidemia   . Hypertension     Past Surgical History:  Procedure Laterality Date  . CHEST TUBE INSERTION Left 04/23/2016   Procedure: CHEST TUBE INSERTION;  Surgeon: [name], MD;  Location: [place];  Service: Thoracic;  Laterality: Left;  . COMPLETE ESOPHAGECTOMY N/A 04/23/2016   Procedure: TRANSHIATIAL TOTAL ESOPHAGECTOMY COMPLETE; CERVICAL ESOPHAGOGASTROSTOMY AND PYLOROPLASTY;  Surgeon: [name], MD;  Location: [place];  Service: Thoracic;  Laterality: N/A;  . cystoscope  4/12/1   prostate  . ERCP    . INGUINAL HERNIA REPAIR    . JEJUNOSTOMY N/A 04/23/2016   Procedure: FEEDING JEJUNOSTOMY;  Surgeon: [name], MD;  Location: [place];  Service: Thoracic;  Laterality: N/A;  . LEG SURGERY Left    hole  . PROSTATE BIOPSY  10/05/15  . right shoulder surgery    . VIDEO BRONCHOSCOPY N/A 04/23/2016   Procedure: VIDEO BRONCHOSCOPY;  Surgeon: [name], MD;  Location: [facility] OR;  Service: Thoracic;  Laterality: N/A;    Prior to Admission medications   Medication Sig Start Date End Date  Taking? Authorizing Provider  acetaminophen (TYLENOL) 325 MG tablet Take 650 mg by mouth every 6 (six) hours as needed.   Yes Historical Provider, MD  atenolol (TENORMIN) 25 MG tablet Take 1 tab in the morning and half tab in the evening Patient taking differently: Take 12.5-25 mg by mouth 2 (two) times daily. Take 41m in the morning and 12.55min the  evening 05/24/16  Yes Rhonda G Barrett, PA-C  ondansetron (ZOFRAN ODT) 8 MG disintegrating tablet Take 1 tablet (8 mg total) by mouth every 8 (eight) hours as needed for nausea or vomiting. 08/15/16  Yes YaTruitt MerleMD  simvastatin (ZOCOR) 20 MG tablet Take 20 mg by mouth daily.   Yes Historical Provider, MD  mirtazapine (REMERON) 15 MG tablet Take 1 tablet (15 mg total) by mouth at bedtime. 08/15/16   YaTruitt MerleMD  Nutritional Supplements (FEEDING SUPPLEMENT, OSMOLITE 1.5 CAL,) LIQD Increase Osmolite 1.5 to 75 mL/hr for 16 hours daily with 140 mL water before and after continuous feeding. Flush or drink additional 600 mL free water daily. Send formula and bags. 08/13/16   YaTruitt MerleMD  oxyCODONE (ROXICODONE) 5 MG/5ML solution 5 mLs (5 mg total) by Per J Tube route every 6 (six) hours as needed for severe pain. Patient not taking: Reported on 08/09/2016 05/04/16   WaJohn GiovanniPA-C    Current Facility-Administered Medications  Medication Dose Route Frequency Provider Last Rate Last Dose  . acetaminophen (TYLENOL) tablet 650 mg  650 mg Oral Q6H PRN DaReubin MilanMD   650 mg at 08/17/16 [id number]. atenolol (TENORMIN) tablet 12.5 mg  12.5 mg Oral QHS DaReubin MilanMD   12.5 mg at 08/17/16 0033  . atenolol (TENORMIN) tablet 25 mg  25 mg Oral Daily DaReubin MilanMD   25 mg at 08/17/16 0944  . ceFEPIme (MAXIPIME) 1 g in dextrose 5 % 50 mL IVPB  1 g Intravenous Q8H Christine E Shade, RPH   1 g at 08/17/16 1231  . feeding supplement (OSMOLITE 1.5 CAL) liquid 1,000 mL  1,000 mL Per Tube Q24H CaBarton DuboisMD      . [SDerrill MemoN 07/24/82/1324]olic acid (FOLVITE) tablet 1 mg  1 mg Oral Daily YaTruitt MerleMD      . free water 120 mL  120 mL Per Tube BID DaReubin MilanMD   120 mL at 08/17/16 1000  . vancomycin (VANCOCIN) IVPB 750 mg/150 ml premix  750 mg Intravenous Q12H ChRanda SpikeRPH   750 mg at 08/17/16 [id number]. [START ON 08/18/2016] vitamin B-12 (CYANOCOBALAMIN) tablet 1,000 mcg  1,000 mcg  Oral Daily YaTruitt MerleMD       Facility-Administered Medications Ordered in Other Encounters  Medication Dose Route Frequency Provider Last Rate Last Dose  . 0.9 %  sodium chloride infusion   Intravenous Once YaTruitt MerleMD        Allergies as of 08/16/2016 - Review Complete 08/16/2016  Allergen Reaction Noted  . No known allergies  04/20/2016    Family History  Problem Relation Age of Onset  . Cancer Maternal Grandfather 5020  gastric cancer   . Alzheimer's disease Mother   . Diabetes Mother   . Stroke Father 785. Multiple sclerosis Sister     Social History   Social History  . Marital status: Married    Spouse name: N/A  . Number of children: 1  . Years of education: N/A  Occupational History  . Truck Geophysicist/field seismologist    Social History Main Topics  . Smoking status: Former Smoker    Packs/day: 1.00    Years: 10.00    Quit date: 07/24/1967  . Smokeless tobacco: Never Used  . Alcohol use No  . Drug use: No  . Sexual activity: Not Currently   Other Topics Concern  . Not on file   Social History Narrative  . No narrative on file    Review of Systems: All systems reviewed and negative except where noted in HPI.  Physical Exam: Vital signs in last 24 hours: Temp:  [98.1 F (36.7 C)-100.5 F (38.1 C)] 98.6 F (37 C) (01/26 1150) Pulse Rate:  [92-113] 92 (01/26 1150) Resp:  [18-24] 22 (01/26 1150) BP: (124-134)/(55-68) 126/68 (01/26 1150) SpO2:  [99 %-100 %] 99 % (01/26 0639) Last BM Date: 08/17/16 General:   Alert, thin white male in NAD Head:  Normocephalic and atraumatic. Eyes:  Sclera clear, no icterus.   Conjunctiva pink. Ears:  Normal auditory acuity. Nose:  No deformity, discharge,  or lesions. Neck:  Supple; no masses. Lungs:  Clear throughout to auscultation.   No wheezes, crackles, or rhonchi. Heart:  Regular rate and rhythm Abdomen:  Soft,nontender, BS active,no palp mass. Jtube sutured in. Rectal:  Scant slimy light brown secretion, heme positive    Msk:  Symmetrical without gross deformities. . Extremities:  Without clubbing or edema. Neurologic:  Alert and  oriented x4;  grossly normal neurologically. Skin:  Intact without significant lesions or rashes.. Psych:  Alert and cooperative. Normal mood and affect.  Intake/Output from previous day: 01/25 0701 - 01/26 0700 In: 1078.8 [P.O.:120; NG/GT:658.8; IV Piggyback:300] Out: 400 [Urine:400] Intake/Output this shift: No intake/output data recorded.  Lab Results:  Recent Labs  08/16/16 1206 08/16/16 1854 08/17/16 0424  WBC 7.9 6.6 6.4  HGB 6.5* 8.9* 8.8*  HCT 18.9* 25.7* 24.9*  PLT 72* 64* 69*   BMET  Recent Labs  08/15/16 1230 08/16/16 1206 08/17/16 0424  NA 137 136 134*  K 4.8 4.9 4.2  CL  --   --  102  CO2 _0 GLUCOSE 137 171* 223*  BUN 30.1* 29.4* 28*  CREATININE 0.7 0.7 0.53*  CALCIUM 9.4 9.3 8.4*   LFT  Recent Labs  08/16/16 1206 08/17/16 0424  PROT 6.7 5.7*  ALBUMIN 3.6 3.4*  AST 129* 103*  ALT 55 52  ALKPHOS 432* 346*  BILITOT 1.8*  2.16* 1.7*  BILIDIR 0.58*  --   IBILI 1.22*  --    PT/INR  Recent Labs  08/16/16 1206 08/17/16 0424  LABPROT  --  17.5*  INR 1.2 1.42    Studies/Results: US Abdomen Complete  Result Date: 08/17/2016 CLINICAL DATA:  Abnormal liver function. EXAM: ABDOMEN ULTRASOUND COMPLETE COMPARISON:  CT 08/14/2016 FINDINGS: Gallbladder: No gallstones or wall thickening visualized. No sonographic Murphy sign noted by sonographer. Common bile duct: Diameter: Normal caliber, 5 mm Liver: No focal lesion identified. Within normal limits in parenchymal echogenicity. IVC: No abnormality visualized. Pancreas: Visualized portion unremarkable. Spleen: Size and appearance within normal limits. Right Kidney: Length: 11.6 cm. Multiple cysts, the largest 6.1 cm. No hydronephrosis. Normal echotexture. Left Kidney: Length: 10.6 cm. Multiple cysts, the largest 4.7 cm. No hydronephrosis. Normal echotexture. Abdominal aorta: No  aneurysm visualized. Other findings: None. IMPRESSION: No acute findings. Bilateral renal cysts. Electronically Signed   By: Rolm Baptise M.D.   On: 08/17/2016 08:22   Dg Chest [facility] [address]  Result Date: 08/16/2016 CLINICAL DATA:  76 y/o  M; fever and history of esophageal cancer. EXAM: PORTABLE CHEST 1 VIEW COMPARISON:  08/02/2016 chest radiograph and 08/14/2016 PET-CT. FINDINGS: The heart size and mediastinal contours are within normal limits and stable. Moderate left pleural effusion is increased from prior radiograph with probably stable from prior PET-CT with redistribution to the lung base. Associated left basilar opacity. Otherwise no focal consolidation identified. The visualized skeletal structures are unremarkable. IMPRESSION: Moderate left pleural effusion increased from prior chest radiograph, probably stable from prior chest PET-CT. She fusion of the lung base. Associated left basilar opacity probably represents atelectasis, underline pneumonia is not excluded. Electronically Signed   By: Kristine Garbe M.D.   On: 08/16/2016 19:14   Tye Savoy, NP-C @  08/17/2016, 5:01 PM  Pager number [phone]  GI ATTENDING Chart reviewed as well as labs. Agree with comprehensive consult note after reviewing case with APP. Patient with probable hemolytic anemia and thrombocytopenia. History of esophageal cancer.  ?? GI bleed raised. STOOL IS BROWN ON RECTAL EXAM. Recommend PPI and monitoring for evidence of GI bleed. If acute bleeding develops may need endoscopy. Otherwiswe, management of hemolytic anemia per hematology team. We will follow. Thanks.  Docia Chuck. Geri Seminole., M.D. [facility] Division of Gastroenterology

## 2016-08-17 NOTE — Progress Notes (Signed)
Spoke with pt and his wife concerning discharge needs. Both pt and wife stated there are no needs at present time.

## 2016-08-17 NOTE — Progress Notes (Signed)
[facility] PROGRESS NOTE  Fred [name] [id number] DOB: [birth year] DOA: 08/16/2016 PCP: [name], MD  Interim summary and HPI 76 y.o. male with medical history significant of esophageal cancer, status post chemo and radiation therapy, status post esophagectomy, cervical esophagogastrostomy and pyloroplasty, and J-tube placement; also with GERD, hyperlipidemia, hypertension who presented to the counseling center with complaints of weakness, low-grade fevers, chills and emesis since yesterday. He denies headaches, sore throat, rhinorrhea, cough, chest pain, abdominal pain, diarrhea, hematochezia, dysuria and hematuria. Patient endorses some dark stools seen; but not sure for how long. Admitted due to low Hgb and symptomatic anemia. Oncology service concerned for hemolytic anemia/DIC.  Assessment/Plan: Fever -unclear etiology -Continue broad-spectrum IV antibiotics for now. -Follow-up blood cultures and sensitivity. -patient with CXR suggesting reaccumulation of pleural effusion and also atelectasis vs PNA. -will continue supportive care  Symptomatic anemia -Received 2 units of packed RBCs on 1/25 -will follow Hgb trend -Coombs test is negative. -Monitor hematocrit and hemoglobin and transfuse if Hgb less than 8 -GI consulted for potential EGD -patient started on PPI -oncology to continue work up for  DIC and hemolytic anemia, will follow further rec's  Thrombocytopenia -Monitor platelet levels. -transfuse for less < 15,000 or signs of active bleeding   HTN (hypertension) -Continue atenolol 25 mg daily and 12.5 mg by mouth at bedtime. -BP is well controlled and stable  Malnutrition ([place]) -Continue Osmolite feedings through J-tube.  Esophageal cancer ([place]) Continue follow-up as a schedule by oncology and CVTS as an outpatient.  Hyperlipidemia -continue Holding simvastatin due to abnormal LFTs.  -LFT's improving/trending down and with no abnormalities seen  on biliary tract/gallbladder US    Code Status: Full Family Communication: wife and daughter at bedside  Disposition Plan: remains inpatient, will follow blood cx's, continue broad spectrum antibiotics and follow Hgb trend. Oncology has ordered ADAMS 13 and further DIC tests. GI has been consulted and patient will most likely required EGD.   Consultants:  Oncology  GI  Procedures:  See below for x-ray reports   Antibiotics:  vanc and cefepime   HPI/Subjective: Afebrile, no CP, no SOB. Patient reports dark stools.  Objective: Vitals:   08/17/16 0959 08/17/16 1150  BP:  126/68  Pulse:  92  Resp:  (!) 22  Temp: 98.1 F (36.7 C) 98.6 F (37 C)    Intake/Output Summary (Last 24 hours) at 08/17/16 1543 Last data filed at 08/17/16 0640  Gross per 24 hour  Intake          1078.75 ml  Output              400 ml  Net           678.75 ml   There were no vitals filed for this visit.  Exam:   General:  Afebrile today, no CP, no SOB, no coughing spells. Patient low grade temp overnight. Reported dark stools.  Cardiovascular: S1 and S2, no rubs, no gallops  Respiratory: no wheezing, no crackles, decrease Breath sounds at bases (mainly left side)  Abdomen: tube feeding in place, no erythema or drainage seen, abdominal exam is benign, no guarding, positive BS  Musculoskeletal: no edema, no cyanosis   Data Reviewed: Basic Metabolic Panel:  Recent Labs Lab 08/15/16 1230 08/16/16 1206 08/17/16 0424  NA 137 136 134*  K 4.8 4.9 4.2  CL  --   --  102  CO2 23 25 25   GLUCOSE 137 171* 223*  BUN 30.1* 29.4* 28*  CREATININE 0.7 0.7  0.53*  CALCIUM 9.4 9.3 8.4*   Liver Function Tests:  Recent Labs Lab 08/15/16 1230 08/16/16 1206 08/17/16 0424  AST 138* 129* 103*  ALT 43 55 52  ALKPHOS 381* 432* 346*  BILITOT 2.21* 1.8*  2.16* 1.7*  PROT 6.8 6.7 5.7*  ALBUMIN 3.8 3.6 3.4*   CBC:  Recent Labs Lab 08/15/16 1230 08/16/16 1206 08/16/16 1854 08/17/16 0424   WBC 9.1 7.9 6.6 6.4  NEUTROABS 6.6* 5.9 4.5 4.4  HGB 7.4* 6.5* 8.9* 8.8*  HCT 21.1* 18.9* 25.7* 24.9*  MCV 88.7 89.2 86.2 87.4  PLT 78* 72* 64* 69*   BNP (last 3 results)  Recent Labs  05/03/16 0256  BNP 13.6   CBG:  Recent Labs Lab 08/14/16 0643  GLUCAP 206*    Recent Results (from the past 240 hour(s))  TECHNOLOGIST REVIEW     Status: None   Collection Time: 08/09/16  1:04 PM  Result Value Ref Range Status   Technologist Review Metas and Myelocytes present  Final  Urine Culture     Status: None   Collection Time: 08/15/16 12:24 PM  Result Value Ref Range Status   Urine Culture, Routine Final report  Final   Urine Culture result 1 Comment  Final    Comment: Culture shows less than 10,000 colony forming units of bacteria per milliliter of urine. This colony count is not generally considered to be clinically significant.   Culture, blood (single) w Reflex to ID Panel     Status: None (Preliminary result)   Collection Time: 08/15/16 12:24 PM  Result Value Ref Range Status   BLOOD CULTURE, ROUTINE Preliminary report  Preliminary   RESULT 1 Comment  Preliminary    Comment: No growth detected at this time.  TECHNOLOGIST REVIEW     Status: None   Collection Time: 08/15/16 12:30 PM  Result Value Ref Range Status   Technologist Review Metas and Myelocytes present. 9% nrbc. sl poly  Final  TECHNOLOGIST REVIEW     Status: None   Collection Time: 08/16/16 12:06 PM  Result Value Ref Range Status   Technologist Review Metas and Myelocytes present  Final     Studies: US Abdomen Complete  Result Date: 08/17/2016 CLINICAL DATA:  Abnormal liver function. EXAM: ABDOMEN ULTRASOUND COMPLETE COMPARISON:  CT 08/14/2016 FINDINGS: Gallbladder: No gallstones or wall thickening visualized. No sonographic Murphy sign noted by sonographer. Common bile duct: Diameter: Normal caliber, 5 mm Liver: No focal lesion identified. Within normal limits in parenchymal echogenicity. IVC: No  abnormality visualized. Pancreas: Visualized portion unremarkable. Spleen: Size and appearance within normal limits. Right Kidney: Length: 11.6 cm. Multiple cysts, the largest 6.1 cm. No hydronephrosis. Normal echotexture. Left Kidney: Length: 10.6 cm. Multiple cysts, the largest 4.7 cm. No hydronephrosis. Normal echotexture. Abdominal aorta: No aneurysm visualized. Other findings: None. IMPRESSION: No acute findings. Bilateral renal cysts. Electronically Signed   By: Rolm Baptise M.D.   On: 08/17/2016 08:22   Dg Chest Port 1 View  Result Date: 08/16/2016 CLINICAL DATA:  76 y/o  M; fever and history of esophageal cancer. EXAM: PORTABLE CHEST 1 VIEW COMPARISON:  08/02/2016 chest radiograph and 08/14/2016 PET-CT. FINDINGS: The heart size and mediastinal contours are within normal limits and stable. Moderate left pleural effusion is increased from prior radiograph with probably stable from prior PET-CT with redistribution to the lung base. Associated left basilar opacity. Otherwise no focal consolidation identified. The visualized skeletal structures are unremarkable. IMPRESSION: Moderate left pleural effusion increased from prior chest radiograph,  probably stable from prior chest PET-CT. She fusion of the lung base. Associated left basilar opacity probably represents atelectasis, underline pneumonia is not excluded. Electronically Signed   By: Kristine Garbe M.D.   On: 08/16/2016 19:14    Scheduled Meds: . atenolol  12.5 mg Oral QHS  . atenolol  25 mg Oral Daily  . ceFEPime (MAXIPIME) IV  1 g Intravenous Q8H  . feeding supplement (OSMOLITE 1.5 CAL)  1,000 mL Per Tube Q24H  . free water  120 mL Per Tube BID  . vancomycin  750 mg Intravenous Q12H   Continuous Infusions:  Principal Problem:   Fever Active Problems:   HTN (hypertension)   Malnutrition (HCC)   Esophageal cancer (HCC)   Hyperlipidemia   Symptomatic anemia   Thrombocytopenia ([place])    Time spent: 30  minutes    Barton Dubois  [facility] Pager [phone]. If 7PM-7AM, please contact night-coverage at [email or web address], password [facility] 08/17/2016, 3:43 PM  LOS: 1 day

## 2016-08-18 DIAGNOSIS — E43 Unspecified severe protein-calorie malnutrition: Secondary | ICD-10-CM

## 2016-08-18 DIAGNOSIS — D599 Acquired hemolytic anemia, unspecified: Secondary | ICD-10-CM

## 2016-08-18 DIAGNOSIS — R195 Other fecal abnormalities: Secondary | ICD-10-CM

## 2016-08-18 LAB — IRON AND TIBC
IRON: 82 ug/dL (ref 45–182)
Saturation Ratios: 29 % (ref 17.9–39.5)
TIBC: 286 ug/dL (ref 250–450)
UIBC: 204 ug/dL

## 2016-08-18 LAB — CBC WITH DIFFERENTIAL/PLATELET
Basophils Absolute: 0 10*3/uL (ref 0.0–0.1)
Basophils Relative: 0 %
EOS PCT: 1 %
Eosinophils Absolute: 0 10*3/uL (ref 0.0–0.7)
HEMATOCRIT: 21.1 % — AB (ref 39.0–52.0)
HEMOGLOBIN: 7.5 g/dL — AB (ref 13.0–17.0)
LYMPHS ABS: 1.1 10*3/uL (ref 0.7–4.0)
Lymphocytes Relative: 23 %
MCH: 31 pg (ref 26.0–34.0)
MCHC: 35.5 g/dL (ref 30.0–36.0)
MCV: 87.2 fL (ref 78.0–100.0)
MONOS PCT: 12 %
Monocytes Absolute: 0.6 10*3/uL (ref 0.1–1.0)
Neutro Abs: 2.9 10*3/uL (ref 1.7–7.7)
Neutrophils Relative %: 64 %
Platelets: 61 10*3/uL — ABNORMAL LOW (ref 150–400)
RBC: 2.42 MIL/uL — AB (ref 4.22–5.81)
RDW: 18.8 % — ABNORMAL HIGH (ref 11.5–15.5)
WBC: 4.6 10*3/uL (ref 4.0–10.5)

## 2016-08-18 LAB — COMPREHENSIVE METABOLIC PANEL
ALBUMIN: 2.9 g/dL — AB (ref 3.5–5.0)
ALK PHOS: 326 U/L — AB (ref 38–126)
ALT: 62 U/L (ref 17–63)
AST: 84 U/L — AB (ref 15–41)
Anion gap: 7 (ref 5–15)
BILIRUBIN TOTAL: 1.4 mg/dL — AB (ref 0.3–1.2)
BUN: 19 mg/dL (ref 6–20)
CO2: 25 mmol/L (ref 22–32)
Calcium: 8.1 mg/dL — ABNORMAL LOW (ref 8.9–10.3)
Chloride: 100 mmol/L — ABNORMAL LOW (ref 101–111)
Creatinine, Ser: 0.46 mg/dL — ABNORMAL LOW (ref 0.61–1.24)
GFR calc Af Amer: >60 mL/min (ref >60)
GLUCOSE: 188 mg/dL — AB (ref 65–99)
Potassium: 4 mmol/L (ref 3.5–5.1)
Sodium: 132 mmol/L — ABNORMAL LOW (ref 135–145)
Total Protein: 5.3 g/dL — ABNORMAL LOW (ref 6.5–8.1)

## 2016-08-18 LAB — LACTATE DEHYDROGENASE: LDH: 761 U/L — ABNORMAL HIGH (ref 98–192)

## 2016-08-18 LAB — DIC (DISSEMINATED INTRAVASCULAR COAGULATION) PANEL
APTT: 54 s — AB (ref 24–36)
FIBRINOGEN: 192 mg/dL — AB (ref 210–475)
PLATELETS: 60 10*3/uL — AB (ref 150–400)

## 2016-08-18 LAB — DIC (DISSEMINATED INTRAVASCULAR COAGULATION)PANEL
INR: 1.44
Prothrombin Time: 17.6 seconds — ABNORMAL HIGH (ref 11.4–15.2)
Smear Review: NONE SEEN

## 2016-08-18 LAB — PREPARE RBC (CROSSMATCH)

## 2016-08-18 LAB — D-DIMER, QUANTITATIVE: D-DIMER: >20 mg/L FEU — ABNORMAL HIGH (ref 0.00–0.49)

## 2016-08-18 LAB — ADAMTS13 ACTIVITY: Adamts 13 Activity: 47.9 % — ABNORMAL LOW (ref >66.8)

## 2016-08-18 LAB — ADAMTS13 ACTIVITY REFLEX

## 2016-08-18 LAB — OCCULT BLOOD X 1 CARD TO LAB, STOOL: Fecal Occult Bld: POSITIVE — AB

## 2016-08-18 LAB — FERRITIN: FERRITIN: 1401 ng/mL — AB (ref 24–336)

## 2016-08-18 LAB — RETICULOCYTES
RBC.: 2.42 MIL/uL — ABNORMAL LOW (ref 4.22–5.81)
Retic Count, Absolute: 244.4 10*3/uL — ABNORMAL HIGH (ref 19.0–186.0)
Retic Ct Pct: 10.1 % — ABNORMAL HIGH (ref 0.4–3.1)

## 2016-08-18 MED ORDER — SODIUM CHLORIDE 0.9 % IV SOLN: Freq: Once | INTRAVENOUS | Status: DC

## 2016-08-18 MED ORDER — OSMOLITE 1.5 CAL PO LIQD
1000.0000 mL | ORAL | Status: DC
  Administered 2016-08-18 – 2016-08-20 (×3): 1000 mL
  Filled 2016-08-18 (×4): qty 1000

## 2016-08-18 MED ORDER — HYDROGEN PEROXIDE 3 % EX SOLN
CUTANEOUS | Status: AC
  Administered 2016-08-18: 18:00:00
  Filled 2016-08-18: qty 473

## 2016-08-18 MED ORDER — FUROSEMIDE 10 MG/ML IJ SOLN
40.0000 mg | Freq: Once | INTRAMUSCULAR | Status: AC
  Administered 2016-08-18: 15:00:00 40 mg via INTRAVENOUS
  Filled 2016-08-18: qty 4

## 2016-08-18 MED ORDER — ACETAMINOPHEN 325 MG PO TABS
650.0000 mg | ORAL_TABLET | Freq: Once | ORAL | Status: AC
  Administered 2016-08-18: 12:00:00 650 mg via ORAL
  Filled 2016-08-18: qty 2

## 2016-08-18 MED ORDER — DIPHENHYDRAMINE HCL 50 MG/ML IJ SOLN
25.0000 mg | Freq: Once | INTRAMUSCULAR | Status: AC
  Administered 2016-08-18: 12:00:00 25 mg via INTRAVENOUS
  Filled 2016-08-18: qty 1

## 2016-08-18 MED ORDER — PREDNISONE 20 MG PO TABS
60.0000 mg | ORAL_TABLET | Freq: Every day | ORAL | Status: DC
  Administered 2016-08-18 – 2016-08-21 (×4): 60 mg via ORAL
  Filled 2016-08-18 (×4): qty 3

## 2016-08-18 NOTE — Progress Notes (Signed)
Subjective: The patient is seen and examined today. His wife and daughter were at the bedside. He is feeling much better today with no specific complaints. He denied having any current fever or chills. He has no nausea or vomiting. He denied having any abdominal pain. He has no significant chest pain, shortness breath, cough or hemoptysis. He received 2 units of PRBCs transfusion before his admission but his hemoglobin is low today at 7.5.  Objective: Vital signs in last 24 hours: Temp:  [98.6 F (37 C)-99.1 F (37.3 C)] 99.1 F (37.3 C) (01/27 0446) Pulse Rate:  [92-103] 103 (01/27 0446) Resp:  [20-22] 20 (01/27 0446) BP: (117-126)/(56-68) 117/56 (01/27 0446) SpO2:  [98 %-99 %] 98 % (01/27 0446)  Intake/Output from previous day: 01/26 0701 - 01/27 0700 In: 1180 [P.O.:60; NG/GT:870; IV Piggyback:250] Out: 650 [Urine:650] Intake/Output this shift: No intake/output data recorded.  General appearance: alert, cooperative, fatigued and no distress Resp: clear to auscultation bilaterally Cardio: regular rate and rhythm, S1, S2 normal, no murmur, click, rub or gallop GI: soft, non-tender; bowel sounds normal; no masses,  no organomegaly Extremities: extremities normal, atraumatic, no cyanosis or edema  Lab Results:   Recent Labs  08/17/16 1722 08/18/16 0503  WBC 5.2 4.6  HGB 8.3* 7.5*  HCT 23.5* 21.1*  PLT 60* 60*  61*   BMET  Recent Labs  08/17/16 0424 08/18/16 0503  NA 134* 132*  K 4.2 4.0  CL 102 100*  CO2 25 25  GLUCOSE 223* 188*  BUN 28* 19  CREATININE 0.53* 0.46*  CALCIUM 8.4* 8.1*    Studies/Results: US Abdomen Complete  Result Date: 08/17/2016 CLINICAL DATA:  Abnormal liver function. EXAM: ABDOMEN ULTRASOUND COMPLETE COMPARISON:  CT 08/14/2016 FINDINGS: Gallbladder: No gallstones or wall thickening visualized. No sonographic Murphy sign noted by sonographer. Common bile duct: Diameter: Normal caliber, 5 mm Liver: No focal lesion identified. Within normal  limits in parenchymal echogenicity. IVC: No abnormality visualized. Pancreas: Visualized portion unremarkable. Spleen: Size and appearance within normal limits. Right Kidney: Length: 11.6 cm. Multiple cysts, the largest 6.1 cm. No hydronephrosis. Normal echotexture. Left Kidney: Length: 10.6 cm. Multiple cysts, the largest 4.7 cm. No hydronephrosis. Normal echotexture. Abdominal aorta: No aneurysm visualized. Other findings: None. IMPRESSION: No acute findings. Bilateral renal cysts. Electronically Signed   By: Rolm Baptise M.D.   On: 08/17/2016 08:22   Dg Chest Port 1 View  Result Date: 08/16/2016 CLINICAL DATA:  76 y/o  M; fever and history of esophageal cancer. EXAM: PORTABLE CHEST 1 VIEW COMPARISON:  08/02/2016 chest radiograph and 08/14/2016 PET-CT. FINDINGS: The heart size and mediastinal contours are within normal limits and stable. Moderate left pleural effusion is increased from prior radiograph with probably stable from prior PET-CT with redistribution to the lung base. Associated left basilar opacity. Otherwise no focal consolidation identified. The visualized skeletal structures are unremarkable. IMPRESSION: Moderate left pleural effusion increased from prior chest radiograph, probably stable from prior chest PET-CT. She fusion of the lung base. Associated left basilar opacity probably represents atelectasis, underline pneumonia is not excluded. Electronically Signed   By: Kristine Garbe M.D.   On: 08/16/2016 19:14    Medications: I have reviewed the patient's current medications.  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 He is currently on observation. There is significant elevation of his CEA concerning for disease recurrence but the PET scan performed on 08/19/2015 showed no clear evidence for  disease progression. 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 No schistocytes were seen on the peripheral blood smear. Anemia panel showed normal serum iron, elevated serum ferritin and normal vitamin B-12. Reticulocyte count is significantly increased. The patient also has positive Hemoccult and he is currently followed by gastroenterology.  She will need transfusion with PRBCs for 1 or 2 units today. Platelets count are stable. We'll continue to monitor for now. ADAMTS13 to rule out TTP is still pending. The patient may also benefit from treatment with high-dose prednisone 1 mg/KG daily of his hemolytic anemia. This would be tapered gradually. Thank you for taking good care of Mr. Montalbano, we will continue to follow up the patient with you and assist in his management.   LOS: 2 days    Miley Blanchett K. 08/18/2016

## 2016-08-18 NOTE — Progress Notes (Signed)
[facility] PROGRESS NOTE  Fred [name] [id number] DOB: Jul 10, 1941 DOA: 08/16/2016 PCP: [name], MD  Interim summary and HPI 76 y.o. male with medical history significant of esophageal cancer, status post chemo and radiation therapy, status post esophagectomy, cervical esophagogastrostomy and pyloroplasty, and J-tube placement; also with GERD, hyperlipidemia, hypertension who presented to the counseling center with complaints of weakness, low-grade fevers, chills and emesis since yesterday. He denies headaches, sore throat, rhinorrhea, cough, chest pain, abdominal pain, diarrhea, hematochezia, dysuria and hematuria. Patient endorses some dark stools seen; but not sure for how long. Admitted due to low Hgb and symptomatic anemia. Oncology service concerned for hemolytic anemia/DIC.  Assessment/Plan: Fever -unclear etiology, but with concerns for PNA -Continue broad-spectrum IV antibiotics for now (given expected compromised immune system. -Follow-up blood cultures and sensitivity. -patient with CXR suggesting reaccumulation of pleural effusion and also atelectasis vs PNA. -will continue supportive care  Symptomatic anemia -Received 2 units of packed RBCs on 1/25 -will follow Hgb trend; down to 7.5 -Coombs test is negative. -Monitor hematocrit and hemoglobin and transfuse if Hgb less than 8 -GI consulted for potential EGD; but holding on that for now -patient started on PPI -oncology with high concerns for hemolytic anemia; will start treatment with prednisone 1mg /kg -continue supportive care  Thrombocytopenia -Monitor platelet levels. -transfuse for less < 15,000 or signs of active bleeding   HTN (hypertension) -Continue atenolol 25 mg daily and 12.5 mg by mouth at bedtime. -BP is well controlled and stable  Malnutrition ([place]) -Continue Osmolite feedings through J-tube.  Esophageal cancer ([place]) Continue follow-up as a schedule by oncology and CVTS as an  outpatient.  Hyperlipidemia -continue Holding simvastatin due to abnormal LFTs.  -LFT's improving/trending down and with no abnormalities seen on biliary tract/gallbladder US    Code Status: Full Family Communication: wife and daughter at bedside  Disposition Plan: remains inpatient, will follow blood cx's, continue broad spectrum antibiotics and follow Hgb trend. Oncology has ordered ADAMS 13 and further DIC tests.    Consultants:  Oncology  GI  Procedures:  See below for x-ray reports   Antibiotics:  vanc and cefepime 08/16/16  HPI/Subjective: Afebrile, no CP, no SOB. Patient reports dark stools again this morning.  Objective: Vitals:   08/18/16 1444 08/18/16 1640  BP: (!) 128/58 (!) 123/53  Pulse: 75 95  Resp: 20 20  Temp: 99 F (37.2 C) 99.1 F (37.3 C)    Intake/Output Summary (Last 24 hours) at 08/18/16 1702 Last data filed at 08/18/16 1427  Gross per 24 hour  Intake             1666 ml  Output              650 ml  Net             1016 ml   There were no vitals filed for this visit.  Exam:   General:  Afebrile today, no CP, no SOB. Patient with dark stools this morning and Hgb down to 7.5.  Cardiovascular: S1 and S2, no rubs, no gallops  Respiratory: no wheezing, no crackles, decrease Breath sounds at bases (mainly left side)  Abdomen: tube feeding in place, no erythema or drainage seen, abdominal exam is benign, no guarding, positive BS  Musculoskeletal: no edema, no cyanosis   Data Reviewed: Basic Metabolic Panel:  Recent Labs Lab 08/15/16 1230 08/16/16 1206 08/17/16 0424 08/18/16 0503  NA 137 136 134* 132*  K 4.8 4.9 4.2 4.0  CL  --   --  102 100*  CO2 23 25 25 25   GLUCOSE 137 171* 223* 188*  BUN 30.1* 29.4* 28* 19  CREATININE 0.7 0.7 0.53* 0.46*  CALCIUM 9.4 9.3 8.4* 8.1*   Liver Function Tests:  Recent Labs Lab 08/15/16 1230 08/16/16 1206 08/17/16 0424 08/18/16 0503  AST 138* 129* 103* 84*  ALT 43 55 52 62  ALKPHOS  381* 432* 346* 326*  BILITOT 2.21* 1.8*  2.16* 1.7* 1.4*  PROT 6.8 6.7 5.7* 5.3*  ALBUMIN 3.8 3.6 3.4* 2.9*   CBC:  Recent Labs Lab 08/15/16 1230 08/16/16 1206 08/16/16 1854 08/17/16 0424 08/17/16 1722 08/18/16 0503  WBC 9.1 7.9 6.6 6.4 5.2 4.6  NEUTROABS 6.6* 5.9 4.5 4.4  --  2.9  HGB 7.4* 6.5* 8.9* 8.8* 8.3* 7.5*  HCT 21.1* 18.9* 25.7* 24.9* 23.5* 21.1*  MCV 88.7 89.2 86.2 87.4 85.5 87.2  PLT 78* 72* 64* 69* 60* 60*  61*   BNP (last 3 results)  Recent Labs  05/03/16 0256  BNP 13.6   CBG:  Recent Labs Lab 08/14/16 0643  GLUCAP 206*    Recent Results (from the past 240 hour(s))  TECHNOLOGIST REVIEW     Status: None   Collection Time: 08/09/16  1:04 PM  Result Value Ref Range Status   Technologist Review Metas and Myelocytes present  Final  Urine Culture     Status: None   Collection Time: 08/15/16 12:24 PM  Result Value Ref Range Status   Urine Culture, Routine Final report  Final   Urine Culture result 1 Comment  Final    Comment: Culture shows less than 10,000 colony forming units of bacteria per milliliter of urine. This colony count is not generally considered to be clinically significant.   Culture, blood (single) w Reflex to ID Panel     Status: None (Preliminary result)   Collection Time: 08/15/16 12:24 PM  Result Value Ref Range Status   BLOOD CULTURE, ROUTINE Preliminary report  Preliminary   RESULT 1 Comment  Preliminary    Comment: No growth in 36 - 48 hours.  TECHNOLOGIST REVIEW     Status: None   Collection Time: 08/15/16 12:30 PM  Result Value Ref Range Status   Technologist Review Metas and Myelocytes present. 9% nrbc. sl poly  Final  TECHNOLOGIST REVIEW     Status: None   Collection Time: 08/16/16 12:06 PM  Result Value Ref Range Status   Technologist Review Metas and Myelocytes present  Final     Studies: US Abdomen Complete  Result Date: 08/17/2016 CLINICAL DATA:  Abnormal liver function. EXAM: ABDOMEN ULTRASOUND COMPLETE  COMPARISON:  CT 08/14/2016 FINDINGS: Gallbladder: No gallstones or wall thickening visualized. No sonographic Murphy sign noted by sonographer. Common bile duct: Diameter: Normal caliber, 5 mm Liver: No focal lesion identified. Within normal limits in parenchymal echogenicity. IVC: No abnormality visualized. Pancreas: Visualized portion unremarkable. Spleen: Size and appearance within normal limits. Right Kidney: Length: 11.6 cm. Multiple cysts, the largest 6.1 cm. No hydronephrosis. Normal echotexture. Left Kidney: Length: 10.6 cm. Multiple cysts, the largest 4.7 cm. No hydronephrosis. Normal echotexture. Abdominal aorta: No aneurysm visualized. Other findings: None. IMPRESSION: No acute findings. Bilateral renal cysts. Electronically Signed   By: Rolm Baptise M.D.   On: 08/17/2016 08:22   Dg Chest Port 1 View  Result Date: 08/16/2016 CLINICAL DATA:  76 y/o  M; fever and history of esophageal cancer. EXAM: PORTABLE CHEST 1 VIEW COMPARISON:  08/02/2016 chest radiograph and 08/14/2016 PET-CT. FINDINGS: The heart size  and mediastinal contours are within normal limits and stable. Moderate left pleural effusion is increased from prior radiograph with probably stable from prior PET-CT with redistribution to the lung base. Associated left basilar opacity. Otherwise no focal consolidation identified. The visualized skeletal structures are unremarkable. IMPRESSION: Moderate left pleural effusion increased from prior chest radiograph, probably stable from prior chest PET-CT. She fusion of the lung base. Associated left basilar opacity probably represents atelectasis, underline pneumonia is not excluded. Electronically Signed   By: Kristine Garbe M.D.   On: 08/16/2016 19:14    Scheduled Meds: . sodium chloride   Intravenous Once  . atenolol  12.5 mg Oral QHS  . atenolol  25 mg Oral Daily  . ceFEPime (MAXIPIME) IV  1 g Intravenous Q8H  . feeding supplement (OSMOLITE 1.5 CAL)  1,000 mL Per Tube Q24H  .  folic acid  1 mg Oral Daily  . free water  120 mL Per Tube BID  . pantoprazole (PROTONIX) IV  40 mg Intravenous Q12H  . predniSONE  60 mg Oral Q breakfast  . vancomycin  750 mg Intravenous Q12H  . vitamin B-12  1,000 mcg Oral Daily   Continuous Infusions:  Principal Problem:   Fever Active Problems:   HTN (hypertension)   Malnutrition (HCC)   Esophageal cancer (HCC)   Hyperlipidemia   Symptomatic anemia   Thrombocytopenia (HCC)   Acquired hemolytic anemia (HCC)   Pleural effusion   Heme positive stool    Time spent: 30 minutes    Barton Dubois  [facility] Pager [phone]. If 7PM-7AM, please contact night-coverage at [email or web address], password [facility] 08/18/2016, 5:02 PM  LOS: 2 days

## 2016-08-18 NOTE — Progress Notes (Signed)
HISTORY OF PRESENT ILLNESS:  Fred [name] is a 76 y.o. male who we are following or anemia with Hemoccult-positive stool. No interval GI complaints. One formed dark stool today which was Hemoccult-positive. Hemoglobin is slightly lower than yesterday. Primary service has questions regarding tube feeding reinitiation  REVIEW OF SYSTEMS:  All non-GI ROS negative except for fatigue  Past Medical History:  Diagnosis Date  . Esophageal cancer ([place]) 11/23/15   lower 3rd esohagus   . GERD (gastroesophageal reflux disease)   . Hyperlipidemia   . Hypertension     Past Surgical History:  Procedure Laterality Date  . CHEST TUBE INSERTION Left 04/23/2016   Procedure: CHEST TUBE INSERTION;  Surgeon: [name], MD;  Location: [place];  Service: Thoracic;  Laterality: Left;  . COMPLETE ESOPHAGECTOMY N/A 04/23/2016   Procedure: TRANSHIATIAL TOTAL ESOPHAGECTOMY COMPLETE; CERVICAL ESOPHAGOGASTROSTOMY AND PYLOROPLASTY;  Surgeon: [name], MD;  Location: [place];  Service: Thoracic;  Laterality: N/A;  . cystoscope  4/12/1   prostate  . ERCP    . INGUINAL HERNIA REPAIR    . JEJUNOSTOMY N/A 04/23/2016   Procedure: FEEDING JEJUNOSTOMY;  Surgeon: [name], MD;  Location: [place];  Service: Thoracic;  Laterality: N/A;  . LEG SURGERY Left    hole  . PROSTATE BIOPSY  10/05/15  . right shoulder surgery    . VIDEO BRONCHOSCOPY N/A 04/23/2016   Procedure: VIDEO BRONCHOSCOPY;  Surgeon: [name], MD;  Location: [facility] OR;  Service: Thoracic;  Laterality: N/A;    Social History [name]  reports that he quit smoking about 49 years ago. He has a 10.00 pack-year smoking history. He has never used smokeless tobacco. He reports that he does not drink alcohol or use drugs.  family history includes Alzheimer's disease in his mother; Cancer (age of onset: 37) in his maternal grandfather; Diabetes in his mother; Multiple sclerosis in his sister; Stroke (age of onset: 23) in his  father.  Allergies  Allergen Reactions  . No Known Allergies        PHYSICAL EXAMINATION: Vital signs: BP (!) 126/53   Pulse 95   Temp 99.3 F (37.4 C) (Oral)   Resp 16   SpO2 99%   Constitutional: Thin, slightly chronically ill-appearing, no acute distress Psychiatric: alert and oriented x3, cooperative Eyes: extraocular movements intact, anicteric, conjunctiva pink Mouth: oral pharynx moist, no lesions Neck: supple no lymphadenopathy Cardiovascular: heart regular rate and rhythm, no murmur Lungs: clear to auscultation bilaterally Abdomen: soft, nontender, nondistended, no obvious ascites, no peritoneal signs, normal bowel sounds, no organomegaly. Feeding tube in place Rectal: Deferred Extremities: no clubbing cyanosis or lower extremity edema bilaterally Skin: no lesions on visible extremities Neuro: No focal deficits.   ASSESSMENT:  #1. 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 Hemoccult-positive stool without overt GI bleeding in the face of DIC and abnormal upper GI anatomy #3. Feeding difficulties. #4. History of esophageal cancer   PLAN:  #1. Continue overall supportive care #2. Continue PPI #3. Continue to monitor blood counts and stools #4. No plans for endoscopy short of significant bleeding #5. Okay to resume tube feeds as previous  We'll follow  Docia Chuck. Geri Seminole., M.D. [facility] Division of Gastroenterology

## 2016-08-19 DIAGNOSIS — D696 Thrombocytopenia, unspecified: Secondary | ICD-10-CM

## 2016-08-19 DIAGNOSIS — D589 Hereditary hemolytic anemia, unspecified: Secondary | ICD-10-CM

## 2016-08-19 LAB — DIC (DISSEMINATED INTRAVASCULAR COAGULATION) PANEL
APTT: 44 s — AB (ref 24–36)
FIBRINOGEN: 213 mg/dL (ref 210–475)
INR: 1.29
PLATELETS: 80 10*3/uL — AB (ref 150–400)
PROTHROMBIN TIME: 16.2 s — AB (ref 11.4–15.2)
SMEAR REVIEW: NONE SEEN

## 2016-08-19 LAB — RETICULOCYTES
RBC.: 3.63 MIL/uL — ABNORMAL LOW (ref 4.22–5.81)
RETIC COUNT ABSOLUTE: 297.7 10*3/uL — AB (ref 19.0–186.0)
Retic Ct Pct: 8.2 % — ABNORMAL HIGH (ref 0.4–3.1)

## 2016-08-19 LAB — OCCULT BLOOD X 1 CARD TO LAB, STOOL: Fecal Occult Bld: POSITIVE — AB

## 2016-08-19 LAB — CREATININE, SERUM
Creatinine, Ser: 0.48 mg/dL — ABNORMAL LOW (ref 0.61–1.24)
GFR calc Af Amer: >60 mL/min (ref >60)
GFR calc non Af Amer: >60 mL/min (ref >60)

## 2016-08-19 LAB — DIC (DISSEMINATED INTRAVASCULAR COAGULATION)PANEL: D-Dimer, Quant: >20 ug/mL-FEU — ABNORMAL HIGH (ref 0.00–0.50)

## 2016-08-19 LAB — LACTATE DEHYDROGENASE: LDH: 720 U/L — ABNORMAL HIGH (ref 98–192)

## 2016-08-19 MED ORDER — LIP MEDEX EX OINT
TOPICAL_OINTMENT | CUTANEOUS | Status: AC
  Administered 2016-08-19: 21:00:00
  Filled 2016-08-19: qty 7

## 2016-08-19 MED ORDER — PANTOPRAZOLE SODIUM 40 MG PO TBEC
40.0000 mg | DELAYED_RELEASE_TABLET | Freq: Every day | ORAL | Status: DC
Start: 2016-08-20 — End: 2016-08-23
  Administered 2016-08-20 – 2016-08-23 (×4): 40 mg via ORAL
  Filled 2016-08-19 (×4): qty 1

## 2016-08-19 NOTE — Progress Notes (Signed)
Daily Rounding Note  08/19/2016, 3:16 PM  LOS: 3 days   SUBJECTIVE:   Chief complaint: no complaints.  Stool today was "greyish" which is his norm.  No abd pain.  Taking small amounts of regular diet along with tube feedings        OBJECTIVE:         Vital signs in last 24 hours:    Temp:  [97.5 F (36.4 C)-99.1 F (37.3 C)] 98.4 F (36.9 C) (01/28 1417) Pulse Rate:  [88-100] 88 (01/28 1417) Resp:  [16-24] 18 (01/28 1417) BP: (121-135)/(53-67) 121/61 (01/28 1417) SpO2:  [97 %-99 %] 98 % (01/28 1417) Last BM Date: 08/18/16 There were no vitals filed for this visit. General: somewhat frail but comfortable.  alert   Heart: RRR Chest: clear bil  Abdomen: soft, NT, ND.  No mass.  J tube on left benign  Extremities: no CCE Neuro/Psych:  Pleasant, calm, good spirits.  No weakness.   Intake/Output from previous day: 01/27 0701 - 01/28 0700 In: 1760 [P.O.:720; Blood:590; IV Piggyback:450] Out: 750 [Urine:750]  Intake/Output this shift: Total I/O In: 680 [P.O.:480; IV Piggyback:200] Out: -   Lab Results:  Recent Labs  08/17/16 1722 08/18/16 0503 08/19/16 0453  WBC 5.2 4.6 6.7  HGB 8.3* 7.5* 10.8*  HCT 23.5* 21.1* 30.6*  PLT 60* 60*  61* 80*  84*   BMET  Recent Labs  08/17/16 0424 08/18/16 0503 08/19/16 0453  NA 134* 132*  --   K 4.2 4.0  --   CL 102 100*  --   CO2 25 25  --   GLUCOSE 223* 188*  --   BUN 28* 19  --   CREATININE 0.53* 0.46* 0.48*  CALCIUM 8.4* 8.1*  --    LFT  Recent Labs  08/17/16 0424 08/18/16 0503  PROT 5.7* 5.3*  ALBUMIN 3.4* 2.9*  AST 103* 84*  ALT 52 62  ALKPHOS 346* 326*  BILITOT 1.7* 1.4*   PT/INR  Recent Labs  08/18/16 0503 08/19/16 0453  LABPROT 17.6* 16.2*  INR 1.44 1.29   Hepatitis Panel No results for input(s): HEPBSAG, HCVAB, HEPAIGM, HEPBIGM in the last 72 hours.  Studies/Results: No results found.   Scheduled Meds: . sodium chloride    Intravenous Once  . atenolol  12.5 mg Oral QHS  . atenolol  25 mg Oral Daily  . ceFEPime (MAXIPIME) IV  1 g Intravenous Q8H  . feeding supplement (OSMOLITE 1.5 CAL)  1,000 mL Per Tube Q24H  . folic acid  1 mg Oral Daily  . free water  120 mL Per Tube BID  . pantoprazole (PROTONIX) IV  40 mg Intravenous Q12H  . predniSONE  60 mg Oral Q breakfast  . vancomycin  750 mg Intravenous Q12H  . vitamin B-12  1,000 mcg Oral Daily   Continuous Infusions: PRN Meds:.acetaminophen  ASSESMENT:   *  Stage III esophageal adenocarcinoma. Status post chemoradiation and then esophagectomy 04/2016. Rising CEA but PET scan fails to confirm recurrence of cancer.  *  Hemolytic anemia, thrombocytopenia.  Hematology initiated high-dose prednisone 1/27.  Ferrous, excellent response to PRBC x 2.  BID IV Protonix started at admission, no PPI PTA  *  Limited dark, FOBT + stool, no overt GI bleeding.    *  S/p feeding jejunostomy tube fall 2017.    PLAN   *  Switch to po Protonix.   *   No plans for EGD  unless patient develops overt bleeding.   Fred [name]  08/19/2016, 3:16 PM Pager: [phone]  GI ATTENDING  Interval history data reviewed. Patient seen and examined. Agree with interval progress note. No clinically relevant GI bleeding. No new recommendations. Will sign off. Please call for questions or problems.  Docia Chuck. Geri Seminole., M.D. [facility] Division of Gastroenterology

## 2016-08-19 NOTE — Progress Notes (Signed)
Pharmacy Antibiotic Note  Fred [name] is a 76 y.o. male directly admitted on 08/16/2016 from [facility] with weakness, fever, anemia, and concern for infectious process.  PMH includes esophageal cancer s/p esophagectomy (04/2016) on tube feeds, s/p chemoradiation.  Pharmacy has been consulted for Cefepime, Vancomycin dosing.  Today, 08/19/2016 Day #3 full antibiotics Tmax 99.1 WBC wnl SCr stable  Plan:  Continue Cefepime 1g IV q8h  Vancomycin 1g IV x1 (1/25) then 750 mg IV q12h.  Measure Vanc trough tomorrow at 07:30, goal 15-20 mcg/ml  Follow up renal fxn, culture results, and clinical course.     Temp (24hrs), Avg:98.7 F (37.1 C), Min:97.5 F (36.4 C), Max:99.4 F (37.4 C)   Recent Labs Lab 08/15/16 1230 08/16/16 1206 08/16/16 1854 08/17/16 0424 08/17/16 1722 08/18/16 0503 08/19/16 0453  WBC  --  7.9 6.6 6.4 5.2 4.6 6.7  CREATININE 0.7 0.7  --  0.53*  --  0.46* 0.48*    Estimated Creatinine Clearance: 67.3 mL/min (by C-G formula based on SCr of 0.48 mg/dL (L)).    Allergies  Allergen Reactions  . No Known Allergies     Antimicrobials this admission: 1/25 Vancomycin >>  1/25 Cefepime >>    Dose adjustments this admission: 1/29 0730 VT = ___ on 750mg  q12h     Microbiology results: 1/24 BCx: ngtd  ([facility] at [facility])  Thank you for allowing pharmacy to be a part of this patient's care.  Gretta Arab PharmD, BCPS Pager [phone] 08/19/2016 1:21 PM

## 2016-08-19 NOTE — Progress Notes (Signed)
Subjective: The patient is seen and examined today. He is feeling much better today with less fatigue. He denied having any bleeding issues. He was started yesterday on high-dose prednisone. He also receive 2 units of PRBCs transfusion yesterday. He denied having any fever or chills. He has no nausea or vomiting. He denied having any chest pain or shortness breath.  Objective: Vital signs in last 24 hours: Temp:  [97.5 F (36.4 C)-99.7 F (37.6 C)] 97.5 F (36.4 C) (01/28 0515) Pulse Rate:  [75-100] 98 (01/28 0515) Resp:  [15-24] 16 (01/28 0515) BP: (123-135)/(52-67) 129/60 (01/28 0515) SpO2:  [97 %-99 %] 98 % (01/28 0515)  Intake/Output from previous day: 01/27 0701 - 01/28 0700 In: 1760 [P.O.:720; Blood:590; IV Piggyback:450] Out: 750 [Urine:750] Intake/Output this shift: No intake/output data recorded.  General appearance: alert, cooperative, fatigued and no distress Resp: clear to auscultation bilaterally Cardio: regular rate and rhythm, S1, S2 normal, no murmur, click, rub or gallop and normal apical impulse GI: soft, non-tender; bowel sounds normal; no masses,  no organomegaly Extremities: extremities normal, atraumatic, no cyanosis or edema  Lab Results:   Recent Labs  08/18/16 0503 08/19/16 0453  WBC 4.6 6.7  HGB 7.5* 10.8*  HCT 21.1* 30.6*  PLT 60*  61* 80*  84*   BMET  Recent Labs  08/17/16 0424 08/18/16 0503 08/19/16 0453  NA 134* 132*  --   K 4.2 4.0  --   CL 102 100*  --   CO2 25 25  --   GLUCOSE 223* 188*  --   BUN 28* 19  --   CREATININE 0.53* 0.46* 0.48*  CALCIUM 8.4* 8.1*  --     Studies/Results: No results found.  Medications: I have reviewed the patient's current medications.  Assessment/Plan: 1) history of stage IIIa distal esophageal adenocarcinoma status post neoadjuvant concurrent chemoradiation followed by esophagectomy in October 2017. He continues to have rising CEA but no evidence of recurrence on the recent PET scan. Further  management and treatment options. Dr. Burr Medico. 2) questionable hemolytic anemia and thrombocytopenia: The patient was started on high-dose prednisone yesterday. He is feeling much better. His hemoglobin and hematocrit as well as platelets count are improved today. We will continue with the current treatment with high-dose prednisone in addition to the impact treatment for underlying infection.Dr. Burr Medico will see the patient tomorrow for any further recommendation.    LOS: 3 days    Jimel Myler K. 08/19/2016

## 2016-08-19 NOTE — Progress Notes (Signed)
[facility] PROGRESS NOTE  Fred [name] [id number] DOB: May 13, 1941 DOA: 08/16/2016 PCP: [name], MD  Interim summary and HPI 76 y.o. male with medical history significant of esophageal cancer, status post chemo and radiation therapy, status post esophagectomy, cervical esophagogastrostomy and pyloroplasty, and J-tube placement; also with GERD, hyperlipidemia, hypertension who presented to the counseling center with complaints of weakness, low-grade fevers, chills and emesis since yesterday. He denies headaches, sore throat, rhinorrhea, cough, chest pain, abdominal pain, diarrhea, hematochezia, dysuria and hematuria. Patient endorses some dark stools seen; but not sure for how long. Admitted due to low Hgb and symptomatic anemia. Oncology service concerned for hemolytic anemia/DIC.  Assessment/Plan: Fever -unclear etiology, but with concerns for PNA -Continue broad-spectrum IV antibiotics for now (given expected compromised immune system. -Follow-up blood cultures and sensitivity. -patient with CXR suggesting reaccumulation of pleural effusion and also atelectasis vs PNA. -will continue supportive care, given no resp complaints will hold on thoracentesis as discussed with Dr. Julien Nordmann.  Symptomatic anemia -Received 2 units of packed RBCs on 1/25 and 2 more units on 1/28 -Hgb 10.8 now -Coombs test was negative. But with elevated LDH, low haptoglobin, elevated bilirubin, low fibrinogen and some schistocytes. With high concerns for hemolytic anemia. -per oncology rec's started on high dose prednisone  -will Monitor hematocrit and hemoglobin and transfuse if Hgb less than 8 -GI consulted for potential EGD; but holding on that for now -patient started on PPI, now transition to PO -continue supportive care  Thrombocytopenia -Monitor platelet levels. -transfuse for less < 15,000 or signs of active bleeding   HTN (hypertension) -Continue atenolol 25 mg daily and 12.5 mg by  mouth at bedtime. -BP is well controlled and stable  Malnutrition ([place]) -Continue Osmolite feedings through J-tube.  Esophageal cancer ([place]) Continue follow-up as a schedule by oncology and CVTS as an outpatient.  Hyperlipidemia -continue Holding simvastatin due to abnormal LFTs.  -LFT's improving/trending down and with no abnormalities seen on biliary tract/gallbladder US    Code Status: Full Family Communication: wife and daughter at bedside  Disposition Plan: remains inpatient, will follow blood cx's, continue broad spectrum antibiotics and follow Hgb trend. Oncology has ordered ADAMS 13 and further DIC tests.    Consultants:  Oncology  GI  Procedures:  See below for x-ray reports   Antibiotics:  vanc and cefepime 08/16/16  HPI/Subjective: Afebrile, no CP, no SOB. Patient denies any further dark stools. Feeling better, more energetic and with improvement in his appetite.   Objective: Vitals:   08/19/16 0515 08/19/16 1417  BP: 129/60 121/61  Pulse: 98 88  Resp: 16 18  Temp: 97.5 F (36.4 C) 98.4 F (36.9 C)    Intake/Output Summary (Last 24 hours) at 08/19/16 1644 Last data filed at 08/19/16 1417  Gross per 24 hour  Intake             1594 ml  Output              750 ml  Net              844 ml   There were no vitals filed for this visit.  Exam:   General:  Afebrile today, no CP, no SOB. Patient denies any further dark stools. Overall less fatigue and feeling much better.  Cardiovascular: S1 and S2, no rubs, no gallops  Respiratory: no wheezing, no crackles, decrease Breath sounds at bases (mainly left side)  Abdomen: tube feeding in place, no erythema or drainage seen, abdominal exam is benign,  no guarding, positive BS  Musculoskeletal: no edema, no cyanosis   Data Reviewed: Basic Metabolic Panel:  Recent Labs Lab 08/15/16 1230 08/16/16 1206 08/17/16 0424 08/18/16 0503 08/19/16 0453  NA 137 136 134* 132*  --   K 4.8 4.9 4.2 4.0  --    CL  --   --  102 100*  --   CO2 23 25 25 25   --   GLUCOSE 137 171* 223* 188*  --   BUN 30.1* 29.4* 28* 19  --   CREATININE 0.7 0.7 0.53* 0.46* 0.48*  CALCIUM 9.4 9.3 8.4* 8.1*  --    Liver Function Tests:  Recent Labs Lab 08/15/16 1230 08/16/16 1206 08/17/16 0424 08/18/16 0503  AST 138* 129* 103* 84*  ALT 43 55 52 62  ALKPHOS 381* 432* 346* 326*  BILITOT 2.21* 1.8*  2.16* 1.7* 1.4*  PROT 6.8 6.7 5.7* 5.3*  ALBUMIN 3.8 3.6 3.4* 2.9*   CBC:  Recent Labs Lab 08/15/16 1230 08/16/16 1206 08/16/16 1854 08/17/16 0424 08/17/16 1722 08/18/16 0503 08/19/16 0453  WBC 9.1 7.9 6.6 6.4 5.2 4.6 6.7  NEUTROABS 6.6* 5.9 4.5 4.4  --  2.9  --   HGB 7.4* 6.5* 8.9* 8.8* 8.3* 7.5* 10.8*  HCT 21.1* 18.9* 25.7* 24.9* 23.5* 21.1* 30.6*  MCV 88.7 89.2 86.2 87.4 85.5 87.2 84.3  PLT 78* 72* 64* 69* 60* 60*  61* 80*  84*   BNP (last 3 results)  Recent Labs  05/03/16 0256  BNP 13.6   CBG:  Recent Labs Lab 08/14/16 0643  GLUCAP 206*    Recent Results (from the past 240 hour(s))  Urine Culture     Status: None   Collection Time: 08/15/16 12:24 PM  Result Value Ref Range Status   Urine Culture, Routine Final report  Final   Urine Culture result 1 Comment  Final    Comment: Culture shows less than 10,000 colony forming units of bacteria per milliliter of urine. This colony count is not generally considered to be clinically significant.   Culture, blood (single) w Reflex to ID Panel     Status: None (Preliminary result)   Collection Time: 08/15/16 12:24 PM  Result Value Ref Range Status   BLOOD CULTURE, ROUTINE Preliminary report  Preliminary   RESULT 1 Comment  Preliminary    Comment: No growth in 36 - 48 hours.  TECHNOLOGIST REVIEW     Status: None   Collection Time: 08/15/16 12:30 PM  Result Value Ref Range Status   Technologist Review Metas and Myelocytes present. 9% nrbc. sl poly  Final  TECHNOLOGIST REVIEW     Status: None   Collection Time: 08/16/16 12:06 PM   Result Value Ref Range Status   Technologist Review Metas and Myelocytes present  Final     Studies: No results found.  Scheduled Meds: . sodium chloride   Intravenous Once  . atenolol  12.5 mg Oral QHS  . atenolol  25 mg Oral Daily  . ceFEPime (MAXIPIME) IV  1 g Intravenous Q8H  . feeding supplement (OSMOLITE 1.5 CAL)  1,000 mL Per Tube Q24H  . folic acid  1 mg Oral Daily  . free water  120 mL Per Tube BID  . [START ON 08/20/2016] pantoprazole  40 mg Oral Q0600  . predniSONE  60 mg Oral Q breakfast  . vancomycin  750 mg Intravenous Q12H  . vitamin B-12  1,000 mcg Oral Daily   Continuous Infusions:  Principal Problem:   Fever  Active Problems:   HTN (hypertension)   Malnutrition (HCC)   Esophageal cancer (HCC)   Hyperlipidemia   Symptomatic anemia   Thrombocytopenia (HCC)   Acquired hemolytic anemia (HCC)   Pleural effusion   Heme positive stool    Time spent: 30 minutes    Barton Dubois  [facility] Pager [phone]. If 7PM-7AM, please contact night-coverage at [email or web address], password [facility] 08/19/2016, 4:44 PM  LOS: 3 days

## 2016-08-20 LAB — TYPE AND SCREEN
BLOOD PRODUCT EXPIRATION DATE: 201802082359
BLOOD PRODUCT EXPIRATION DATE: 201802082359
BLOOD PRODUCT EXPIRATION DATE: 201802142359
Blood Product Expiration Date: 201802142359
ISSUE DATE / TIME: 201801251324
ISSUE DATE / TIME: 201801251324
ISSUE DATE / TIME: 201801271142
ISSUE DATE / TIME: 201801271648
UNIT TYPE AND RH: 5100
Unit Type and Rh: 5100
Unit Type and Rh: 5100
Unit Type and Rh: 5100

## 2016-08-20 LAB — DIC (DISSEMINATED INTRAVASCULAR COAGULATION) PANEL

## 2016-08-20 LAB — VANCOMYCIN, TROUGH: Vancomycin Tr: 9 ug/mL — ABNORMAL LOW (ref 15–20)

## 2016-08-20 LAB — CBC
HEMATOCRIT: 30.6 % — AB (ref 39.0–52.0)
HEMATOCRIT: 26 % — AB (ref 39.0–52.0)
Hemoglobin: 10.8 g/dL — ABNORMAL LOW (ref 13.0–17.0)
Hemoglobin: 9.1 g/dL — ABNORMAL LOW (ref 13.0–17.0)
MCH: 29.8 pg (ref 26.0–34.0)
MCH: 29.8 pg (ref 26.0–34.0)
MCHC: 35.3 g/dL (ref 30.0–36.0)
MCHC: 35 g/dL (ref 30.0–36.0)
MCV: 84.3 fL (ref 78.0–100.0)
MCV: 85.2 fL (ref 78.0–100.0)
Platelets: 84 10*3/uL — ABNORMAL LOW (ref 150–400)
Platelets: 83 10*3/uL — ABNORMAL LOW (ref 150–400)
RBC: 3.63 MIL/uL — ABNORMAL LOW (ref 4.22–5.81)
RBC: 3.05 MIL/uL — ABNORMAL LOW (ref 4.22–5.81)
RDW: 19.6 % — ABNORMAL HIGH (ref 11.5–15.5)
RDW: 20.4 % — AB (ref 11.5–15.5)
WBC: 6.7 10*3/uL (ref 4.0–10.5)
WBC: 6.6 10*3/uL (ref 4.0–10.5)

## 2016-08-20 LAB — RETICULOCYTES
RBC.: 3.05 MIL/uL — AB (ref 4.22–5.81)
RETIC CT PCT: 8.9 % — AB (ref 0.4–3.1)
Retic Count, Absolute: 271.5 10*3/uL — ABNORMAL HIGH (ref 19.0–186.0)

## 2016-08-20 LAB — DIC (DISSEMINATED INTRAVASCULAR COAGULATION)PANEL
Fibrinogen: 101 mg/dL — ABNORMAL LOW (ref 210–475)
INR: 1.52
Platelets: 81 10*3/uL — ABNORMAL LOW (ref 150–400)
Prothrombin Time: 18.5 seconds — ABNORMAL HIGH (ref 11.4–15.2)
aPTT: 46 seconds — ABNORMAL HIGH (ref 24–36)

## 2016-08-20 LAB — METHYLMALONIC ACID, SERUM: Methylmalonic Acid, Quantitative: 226 nmol/L (ref 0–378)

## 2016-08-20 LAB — LACTATE DEHYDROGENASE: LDH: 563 U/L — AB (ref 98–192)

## 2016-08-20 MED ORDER — LEVOFLOXACIN 750 MG PO TABS
750.0000 mg | ORAL_TABLET | Freq: Every day | ORAL | Status: DC
  Administered 2016-08-20 – 2016-08-23 (×4): 750 mg via ORAL
  Filled 2016-08-20 (×5): qty 1

## 2016-08-20 MED ORDER — JEVITY 1.2 CAL PO LIQD
1000.0000 mL | ORAL | Status: DC
  Filled 2016-08-20: qty 1000

## 2016-08-20 MED ORDER — OSMOLITE 1.5 CAL PO LIQD
1000.0000 mL | ORAL | Status: DC
  Administered 2016-08-20 – 2016-08-22 (×3): 1000 mL
  Filled 2016-08-20 (×4): qty 1000

## 2016-08-20 MED ORDER — VANCOMYCIN HCL IN DEXTROSE 1-5 GM/200ML-% IV SOLN
1000.0000 mg | Freq: Two times a day (BID) | INTRAVENOUS | Status: DC
  Filled 2016-08-20: qty 200

## 2016-08-20 MED ORDER — ONDANSETRON HCL 4 MG/2ML IJ SOLN
4.0000 mg | Freq: Four times a day (QID) | INTRAMUSCULAR | Status: DC | PRN
  Administered 2016-08-20 – 2016-08-21 (×3): 4 mg via INTRAVENOUS
  Filled 2016-08-20 (×3): qty 2

## 2016-08-20 NOTE — Progress Notes (Signed)
[facility] PROGRESS NOTE  Fred [name] [id number] DOB: 1941/01/11 DOA: 08/16/2016 PCP: [name], MD  Interim summary and HPI 76 y.o. male with medical history significant of esophageal cancer, status post chemo and radiation therapy, status post esophagectomy, cervical esophagogastrostomy and pyloroplasty, and J-tube placement; also with GERD, hyperlipidemia, hypertension who presented to the counseling center with complaints of weakness, low-grade fevers, chills and emesis since yesterday. He denies headaches, sore throat, rhinorrhea, cough, chest pain, abdominal pain, diarrhea, hematochezia, dysuria and hematuria. Patient endorses some dark stools seen; but not sure for how long. Admitted due to low Hgb and symptomatic anemia. Oncology service concerned for hemolytic anemia/DIC.  Assessment/Plan: Fever -unclear etiology, but with concerns for PNA -no fever over 72 hours now. Will stop broad-spectrum IV antibiotics and narrow to Levaquin -Follow-up blood cultures and sensitivity. -patient with CXR suggesting reaccumulation of pleural effusion and also atelectasis vs PNA. -will continue supportive care, given no resp complaints will hold on thoracentesis as discussed with Dr. Julien Nordmann.  Symptomatic anemia -Received 2 units of packed RBCs on 1/25 and 2 more units on 1/28 -Hgb 9.1 now -Coombs test was negative. But with elevated LDH, low haptoglobin, elevated bilirubin, low fibrinogen and some schistocytes. With high concerns for hemolytic anemia. -per oncology rec's started on high dose prednisone  -will Monitor hematocrit and hemoglobin and transfuse if Hgb less than 8 -GI consulted for potential EGD; but holding on that for now -patient started on PPI, now transition to PO -continue supportive care  Thrombocytopenia -Monitor platelet levels. -transfuse for less < 15,000 or signs of active bleeding   HTN (hypertension) -Continue atenolol 25 mg daily and 12.5 mg by  mouth at bedtime. -BP is well controlled and stable  Malnutrition ([place]) -Continue Osmolite feedings through J-tube.  Esophageal cancer ([place]) Continue follow-up as a schedule by oncology and CVTS as an outpatient.  Hyperlipidemia -continue Holding simvastatin due to abnormal LFTs on admission.  -LFT's improving/trending down and with no abnormalities seen on biliary tract/gallbladder US    Code Status: Full Family Communication: wife and daughter at bedside  Disposition Plan: remains inpatient, will follow blood cx's, continue broad spectrum antibiotics and follow Hgb trend. Oncology has ordered ADAMS 13 and further DIC tests.    Consultants:  Oncology  GI  Procedures:  See below for x-ray reports   Antibiotics:  vanc and cefepime 08/16/16>>>08/20/16  Levaquin 1/29  HPI/Subjective: Afebrile, no CP, no SOB. Patient denies any further dark stools. Feeling better overall. Endorses some nausea today.  Objective: Vitals:   08/20/16 1042 08/20/16 1309  BP: 123/61 126/62  Pulse: 95 88  Resp:  16  Temp:  98.3 F (36.8 C)    Intake/Output Summary (Last 24 hours) at 08/20/16 1645 Last data filed at 08/20/16 1309  Gross per 24 hour  Intake             1450 ml  Output             1300 ml  Net              150 ml   There were no vitals filed for this visit.  Exam:  General: remains afebrile. Denies CP, no SOB. Patient denies any further dark stools. Overall less fatigue and feeling ok. Reports having episode of nausea today.   Cardiovascular: S1 and S2, no rubs, no gallops  Respiratory: no wheezing, no crackles, decrease Breath sounds at bases (mainly left side)  Abdomen: tube feeding in place, no erythema or drainage  seen, abdominal exam is benign, no guarding, positive BS  Musculoskeletal: no edema, no cyanosis   Data Reviewed: Basic Metabolic Panel:  Recent Labs Lab 08/15/16 1230 08/16/16 1206 08/17/16 0424 08/18/16 0503 08/19/16 0453  NA 137 136  134* 132*  --   K 4.8 4.9 4.2 4.0  --   CL  --   --  102 100*  --   CO2 23 25 25 25   --   GLUCOSE 137 171* 223* 188*  --   BUN 30.1* 29.4* 28* 19  --   CREATININE 0.7 0.7 0.53* 0.46* 0.48*  CALCIUM 9.4 9.3 8.4* 8.1*  --    Liver Function Tests:  Recent Labs Lab 08/15/16 1230 08/16/16 1206 08/17/16 0424 08/18/16 0503  AST 138* 129* 103* 84*  ALT 43 55 52 62  ALKPHOS 381* 432* 346* 326*  BILITOT 2.21* 1.8*  2.16* 1.7* 1.4*  PROT 6.8 6.7 5.7* 5.3*  ALBUMIN 3.8 3.6 3.4* 2.9*   CBC:  Recent Labs Lab 08/15/16 1230 08/16/16 1206  08/16/16 1854 08/17/16 0424 08/17/16 1722 08/18/16 0503 08/19/16 0453 08/20/16 0422  WBC 9.1 7.9  < > 6.6 6.4 5.2 4.6 6.7 6.6  NEUTROABS 6.6* 5.9  --  4.5 4.4  --  2.9  --   --   HGB 7.4* 6.5*  < > 8.9* 8.8* 8.3* 7.5* 10.8* 9.1*  HCT 21.1* 18.9*  < > 25.7* 24.9* 23.5* 21.1* 30.6* 26.0*  MCV 88.7 89.2  < > 86.2 87.4 85.5 87.2 84.3 85.2  PLT 78* 72*  < > 64* 69* 60* 60*  61* 84*  80* 81*  83*  < > = values in this interval not displayed.   BNP (last 3 results)  Recent Labs  05/03/16 0256  BNP 13.6   CBG:  Recent Labs Lab 08/14/16 0643  GLUCAP 206*    Recent Results (from the past 240 hour(s))  Urine Culture     Status: None   Collection Time: 08/15/16 12:24 PM  Result Value Ref Range Status   Urine Culture, Routine Final report  Final   Urine Culture result 1 Comment  Final    Comment: Culture shows less than 10,000 colony forming units of bacteria per milliliter of urine. This colony count is not generally considered to be clinically significant.   Culture, blood (single) w Reflex to ID Panel     Status: None (Preliminary result)   Collection Time: 08/15/16 12:24 PM  Result Value Ref Range Status   BLOOD CULTURE, ROUTINE Preliminary report  Preliminary   RESULT 1 Comment  Preliminary    Comment: No growth in 36 - 48 hours.  TECHNOLOGIST REVIEW     Status: None   Collection Time: 08/15/16 12:30 PM  Result Value Ref  Range Status   Technologist Review Metas and Myelocytes present. 9% nrbc. sl poly  Final  TECHNOLOGIST REVIEW     Status: None   Collection Time: 08/16/16 12:06 PM  Result Value Ref Range Status   Technologist Review Metas and Myelocytes present  Final     Studies: No results found.  Scheduled Meds: . sodium chloride   Intravenous Once  . atenolol  12.5 mg Oral QHS  . atenolol  25 mg Oral Daily  . feeding supplement (OSMOLITE 1.5 CAL)  1,000 mL Per Tube Q24H  . folic acid  1 mg Oral Daily  . free water  120 mL Per Tube BID  . levofloxacin  750 mg Oral Daily  . pantoprazole  40 mg Oral Q0600  . predniSONE  60 mg Oral Q breakfast  . vitamin B-12  1,000 mcg Oral Daily   Continuous Infusions:  Principal Problem:   Fever Active Problems:   HTN (hypertension)   Malnutrition (HCC)   Esophageal cancer (HCC)   Hyperlipidemia   Symptomatic anemia   Thrombocytopenia (HCC)   Acquired hemolytic anemia (HCC)   Pleural effusion   Heme positive stool    Time spent: 30 minutes    Barton Dubois  [facility] Pager [phone]. If 7PM-7AM, please contact night-coverage at [email or web address], password [facility] 08/20/2016, 4:45 PM  LOS: 4 days

## 2016-08-20 NOTE — Care Management Important Message (Signed)
Important Message  Patient Details  Name: Fred [name] MRN: [id number] Date of Birth: 1941/01/22   Medicare Important Message Given:  Yes    Kerin Salen 08/20/2016, 4:54 PM

## 2016-08-20 NOTE — Progress Notes (Signed)
Fred [name]   DOB:[birth year]   DQ#:[id number]   JJH#:[id number]  Hematology and Oncology follow up  Subjective: Patient received 2 more units RBC over the weekend, Hg slightly dropped from 10.8 yesterday to 9.1 this morning. No overt GI bleeding. LDH slightly trending down, his reticulocyte count is still significantly elevated, and fibrinogen level has dropped further. No active bleeding, no clinical signs of thrombosis. He feels better today, was able to ambulate to the bathroom and in the hallway.   Objective:  Vitals:   08/20/16 1042 08/20/16 1309  BP: 123/61 126/62  Pulse: 95 88  Resp:  16  Temp:  98.3 F (36.8 C)    Body mass index is 22.13 kg/m.  Intake/Output Summary (Last 24 hours) at 08/20/16 1929 Last data filed at 08/20/16 1700  Gross per 24 hour  Intake             1930 ml  Output             1300 ml  Net              630 ml     Sclerae unicteric  Oropharynx clear  No peripheral adenopathy  Lungs clear -- no rales or rhonchi  Heart regular rate and rhythm  Abdomen benign  MSK no focal spinal tenderness, no peripheral edema  Neuro nonfocal   CBG (last 3)  No results for input(s): GLUCAP in the last 72 hours.   Labs:  Lab Results  Component Value Date   WBC 6.6 08/20/2016   HGB 9.1 (L) 08/20/2016   HCT 26.0 (L) 08/20/2016   MCV 85.2 08/20/2016   PLT 81 (L) 08/20/2016   PLT 83 (L) 08/20/2016   NEUTROABS 2.9 08/18/2016   CMP Latest Ref Rng & Units 08/19/2016 08/18/2016 08/17/2016  Glucose 65 - 99 mg/dL - 188(H) 223(H)  BUN 6 - 20 mg/dL - 19 28(H)  Creatinine 0.61 - 1.24 mg/dL 0.48(L) 0.46(L) 0.53(L)  Sodium 135 - 145 mmol/L - 132(L) 134(L)  Potassium 3.5 - 5.1 mmol/L - 4.0 4.2  Chloride 101 - 111 mmol/L - 100(L) 102  CO2 22 - 32 mmol/L - 25 25  Calcium 8.9 - 10.3 mg/dL - 8.1(L) 8.4(L)  Total Protein 6.5 - 8.1 g/dL - 5.3(L) 5.7(L)  Total Bilirubin 0.3 - 1.2 mg/dL - 1.4(H) 1.7(H)  Alkaline Phos 38 - 126 U/L - 326(H) 346(H)  AST 15 - 41 U/L - 84(H)  103(H)  ALT 17 - 63 U/L - 62 52    Urine Studies No results for input(s): UHGB, CRYS in the last 72 hours.  Invalid input(s): UACOL, UAPR, USPG, UPH, UTP, UGL, UKET, UBIL, UNIT, UROB, ULEU, UEPI, UWBC, URBC, UBAC, CAST, [place], [place]   Basic Metabolic Panel:  Recent Labs Lab 08/15/16 1230 08/16/16 1206 08/17/16 0424 08/18/16 0503 08/19/16 0453  NA 137 136 134* 132*  --   K 4.8 4.9 4.2 4.0  --   CL  --   --  102 100*  --   CO2 23 25 25 25   --   GLUCOSE 137 171* 223* 188*  --   BUN 30.1* 29.4* 28* 19  --   CREATININE 0.7 0.7 0.53* 0.46* 0.48*  CALCIUM 9.4 9.3 8.4* 8.1*  --    GFR Estimated Creatinine Clearance: 67 mL/min (by C-G formula based on SCr of 0.48 mg/dL (L)). Liver Function Tests:  Recent Labs Lab 08/15/16 1230 08/16/16 1206 08/17/16 0424 08/18/16 0503  AST 138* 129* 103* 84*  ALT 43 55 52 62  ALKPHOS 381* 432* 346* 326*  BILITOT 2.21* 1.8*  2.16* 1.7* 1.4*  PROT 6.8 6.7 5.7* 5.3*  ALBUMIN 3.8 3.6 3.4* 2.9*   No results for input(s): LIPASE, AMYLASE in the last 168 hours. No results for input(s): AMMONIA in the last 168 hours. Coagulation profile  Recent Labs Lab 08/16/16 1206 08/17/16 0424 08/18/16 0503 08/19/16 0453 08/20/16 0422  INR 1.2 1.42 1.44 1.29 1.52    CBC:  Recent Labs Lab 08/15/16 1230 08/16/16 1206  08/16/16 1854 08/17/16 0424 08/17/16 1722 08/18/16 0503 08/19/16 0453 08/20/16 0422  WBC 9.1 7.9  < > 6.6 6.4 5.2 4.6 6.7 6.6  NEUTROABS 6.6* 5.9  --  4.5 4.4  --  2.9  --   --   HGB 7.4* 6.5*  < > 8.9* 8.8* 8.3* 7.5* 10.8* 9.1*  HCT 21.1* 18.9*  < > 25.7* 24.9* 23.5* 21.1* 30.6* 26.0*  MCV 88.7 89.2  < > 86.2 87.4 85.5 87.2 84.3 85.2  PLT 78* 72*  < > 64* 69* 60* 60*  61* 84*  80* 81*  83*  < > = values in this interval not displayed. Cardiac Enzymes: No results for input(s): CKTOTAL, CKMB, CKMBINDEX, TROPONINI in the last 168 hours. BNP: Invalid input(s): POCBNP CBG:  Recent Labs Lab 08/14/16 0643  GLUCAP 206*    D-Dimer  Recent Labs  08/19/16 0453 08/20/16 0422  DDIMER >20.00* >20.00*   Hgb A1c No results for input(s): HGBA1C in the last 72 hours. Lipid Profile No results for input(s): CHOL, HDL, LDLCALC, TRIG, CHOLHDL, LDLDIRECT in the last 72 hours. Thyroid function studies No results for input(s): TSH, T4TOTAL, T3FREE, THYROIDAB in the last 72 hours.  Invalid input(s): FREET3 Anemia work up  [employer]  08/18/16 0503 08/19/16 0453 08/20/16 0422  FERRITIN 1,401*  --   --   TIBC 286  --   --   IRON 82  --   --   RETICCTPCT 10.1* 8.2* 8.9*   Microbiology Recent Results (from the past 240 hour(s))  Urine Culture     Status: None   Collection Time: 08/15/16 12:24 PM  Result Value Ref Range Status   Urine Culture, Routine Final report  Final   Urine Culture result 1 Comment  Final    Comment: Culture shows less than 10,000 colony forming units of bacteria per milliliter of urine. This colony count is not generally considered to be clinically significant.   Culture, blood (single) w Reflex to ID Panel     Status: None (Preliminary result)   Collection Time: 08/15/16 12:24 PM  Result Value Ref Range Status   BLOOD CULTURE, ROUTINE Preliminary report  Preliminary   RESULT 1 Comment  Preliminary    Comment: No growth in 36 - 48 hours.  TECHNOLOGIST REVIEW     Status: None   Collection Time: 08/15/16 12:30 PM  Result Value Ref Range Status   Technologist Review Metas and Myelocytes present. 9% nrbc. sl poly  Final  TECHNOLOGIST REVIEW     Status: None   Collection Time: 08/16/16 12:06 PM  Result Value Ref Range Status   Technologist Review Metas and Myelocytes present  Final      Studies:  No results found.  Assessment: 76 y.o. with past medical history of distal esophageal adenocarcinoma, stage IIIa, status post concurrent chemotherapy and radiation, and esophagectomy in October 2017, presented with worsening anemia, and mild low-grade fever.  1. Hemolytic anemia  and moderate thrombocytopenia, secondary  to DIC, rule out GI bleeding  2. Low grade fever, ? infection, on broad antibiotics  3. History of distal esophagus adenocarcinoma, status post concurrent neoadjuvant chemoirradiation, and esophagectomy, NED on PET 08/14/2016.  4. Elevated AST and bilirubin, likely secondary to hemolysis, no evidence of biliary obstruction or cholecystitis 5. Recurrent left pleural effusion 6. Hypertension 7. Malnutrition, anorexia, on tube feeds, OK for oral intake    Plan:  -His lab test and was consistent with hemolysis (significantly elevated LDH, reticulocyte count, indirect bilirubin, low haptoglobin) and DIC. The etiology of DIC is unclear, possibly infection. - ADAMTS13 activity returned today, it's slightly low, 48% (normal >67%), does not support TTP. to be drawn today.  -The lab evidence of iron deficiency or B12 deficiency -Due to his hemoptysis, will [id number] and folic acid  -Coombs test was negative, no evidence of autoimmune hemolysis. He was started on prednisone 60 mg daily 2 days ago, if no significant improvement, I'll stop in a few days -he was seen by GI, no overt GI bleeding, endoscopy was not recommended  -He is on oral Levaquin for his presumed pneumonia -I am still not certain his etiology of DIC. I recommend to have a echo to rule out valvular issue or endocarditis (although his blood culture is negative so far) -if echo negative, I suggest to consider repeat left thoracentesis, since he is pleural effusion has increased lately,, to ruled out infection and bleeding (much less likely because his h/h started dropping one week after his last thoracentesis)  -I spoke with Dr. Nino Parsley, he does not think patient's DIC is related to his esophagectomy, and it is safe to do EGD if he needed. -I will follow up daily. Will repeat his LDH, ret, and fibrinogen tomorrow   Truitt Merle, MD 08/20/2016  7:29 PM

## 2016-08-20 NOTE — Progress Notes (Signed)
Initial Nutrition Assessment  DOCUMENTATION CODES:   Non-severe (moderate) malnutrition in context of chronic illness  INTERVENTION:   -Continue Osmolite 1.5 @ 75 ml/hr via j-tube over 16 hour period (2000-1200)  Tube feeding regimen provides 1800 kcal (100% of needs), 75 grams of protein, and 914 ml of H2O (1194 ml with free water flush regimen).   NUTRITION DIAGNOSIS:   Malnutrition related to chronic illness as evidenced by mild depletion of body fat, moderate depletion of body fat, mild depletion of muscle mass, moderate depletions of muscle mass.  GOAL:   Patient will meet greater than or equal to 90% of their needs  MONITOR:   PO intake, Supplement acceptance, Labs, Weight trends, TF tolerance, Skin, I & O's  REASON FOR ASSESSMENT:   Low [place]aden    ASSESSMENT:   76 y.o. male with medical history significant of esophageal cancer, status post chemo and radiation therapy, status post esophagectomy, cervical esophagogastrostomy and pyloroplasty, and J-tube placement; also with GERD, hyperlipidemia, hypertension who presented to the counseling center with complaints of weakness, low-grade fevers, chills and emesis since yesterday. He denies headaches, sore throat, rhinorrhea, cough, chest pain, abdominal pain, diarrhea, hematochezia, dysuria and hematuria. Patient endorses some dark stools seen; but not sure for how long. Admitted due to low Hgb and symptomatic anemia. Oncology service concerned for hemolytic anemia/DIC.  Pt admitted for fever and symptomatic anemia.   Spoke with pt and pt wife at bedside. Both confirm that pt receives the majority of nutrition via j-tube, however, pt still eats PO. Pt is consuming a regular diet, noted meal completion of 5-50%. Pt shares that he typically eats multiple times per day, but often only consumes small, bites and sips at a meal period.   Pt and wife shares that pt has made a lot of progress over the past several months. Pt is eating  more by mouth and has become more mobile (pt actually went grocery shopping). Noted a gradual increase in wt over the past 6 months, which is favorable.   Pt wife reports home TF regimen of Osmolite 1.5 at 75 ml/hr over an 11 hour period- reporting he usually receives 3-4 cans per day (1065-1460 kcals, 45-60 grams protein). Pt wife also reports that they have been followed by [facility] RD [place]) and TF orders have recently increased to 5 cans daily. Per RD note, recommended orders are as follows: Osmolite 1.5 @ 75 ml/hr over 16 hour period, with 140 ml free water flush before and after each feeding (regimen provides 1800 kcals, 75 grams protein, and 1785 ml free water, meeting 100% of estimated nutritional needs). Pt was also encouraged to consume 600 ml of fluid by mouth when feeding is not running.   Pt is currently tolerating TF as ordered by [facility] RD (Osmolite 1.5 @ 75 ml/hr x 16 hours). However, pt wife reports concern that TF has been running continuously. Paged Dr. Dyann Kief; received verbal order to manage TF. Modified TF order to promote administration accuracy.   Nutrition-Focused physical exam completed. Findings are mild to moderate fat depletion, mild to moderate muscle depletion, and no edema.   Labs reviewed.   Diet Order:  Diet regular Room service appropriate? Yes; Fluid consistency: Thin  Skin:  Reviewed, no issues  Last BM:  08/19/16  Height:   Ht Readings from Last 1 Encounters:  08/20/16 5\' 5"  (1.651 m)    Weight:   Wt Readings from Last 1 Encounters:  08/20/16 133 lb (60.3 kg)  Ideal Body Weight:  61.8 kg  BMI:  Body mass index is 22.13 kg/m.  Estimated Nutritional Needs:   Kcal:  1650-1850  Protein:  75-90 grams  Fluid:  >1.6 L  EDUCATION NEEDS:   No education needs identified at this time  Jaki Steptoe A. Jimmye Norman, RD, LDN, CDE Pager: [phone] After hours Pager: [phone]

## 2016-08-20 NOTE — Progress Notes (Signed)
Pharmacy Antibiotic Note  Fred [name] is a 76 y.o. male directly admitted on 08/16/2016 from [facility] with weakness, fever, anemia, and concern for infectious process.  PMH includes esophageal cancer s/p esophagectomy (04/2016) on tube feeds, s/p chemoradiation.  Pharmacy has been consulted for Cefepime, Vancomycin dosing.  Today, 08/20/2016 Day #4 full antibiotics afebrile WBC wnl SCr stable Vancomycin trough below goal this morning Cultures unrevealing  Plan:  Continue Cefepime 1g IV q8h  Change vancomycin to 1gm IV q12h, do not think q8h dosing appropriate in this case as patient is afebrile, no leukocytosis, etc.   Discuss with [facility], anticipate vancomycin to be stopped today  Follow-up ability to stop vancomycin (cutures unrevealing)  Follow up renal fxn, culture results, and clinical course.    Temp (24hrs), Avg:98.5 F (36.9 C), Min:98.4 F (36.9 C), Max:98.6 F (37 C)   Recent Labs Lab 08/15/16 1230 08/16/16 1206  08/17/16 0424 08/17/16 1722 08/18/16 0503 08/19/16 0453 08/20/16 0422 08/20/16 0734  WBC  --  7.9  < > 6.4 5.2 4.6 6.7 6.6  --   CREATININE 0.7 0.7  --  0.53*  --  0.46* 0.48*  --   --   VANCOTROUGH  --   --   --   --   --   --   --   --  9*  < > = values in this interval not displayed.  Estimated Creatinine Clearance: 67.3 mL/min (by C-G formula based on SCr of 0.48 mg/dL (L)).    Allergies  Allergen Reactions  . No Known Allergies     Antimicrobials this admission: 1/25 Vancomycin >>  1/25 Cefepime >>    Dose adjustments this admission: 1/29 0730 VT = 9 mcg/ml on 750mg  q12h (prior to 8th dose)   Microbiology results: 1/24 BCx: ngtd  (Drawn at [facility]) 1/24 UCx: 10K insignificant growth  Thank you for allowing pharmacy to be a part of this patient's care.  Doreene Eland, PharmD, BCPS.   Pager: [phone] 08/20/2016 8:38 AM

## 2016-08-20 NOTE — Progress Notes (Addendum)
There are no needs at present time.

## 2016-08-21 ENCOUNTER — Inpatient Hospital Stay (HOSPITAL_COMMUNITY): Payer: Non-veteran care

## 2016-08-21 DIAGNOSIS — D65 Disseminated intravascular coagulation [defibrination syndrome]: Secondary | ICD-10-CM

## 2016-08-21 DIAGNOSIS — I359 Nonrheumatic aortic valve disorder, unspecified: Secondary | ICD-10-CM

## 2016-08-21 LAB — CULTURE, BLOOD (SINGLE)

## 2016-08-21 LAB — ECHOCARDIOGRAM COMPLETE
HEIGHTINCHES: 65 in
Weight: 2162.27 oz

## 2016-08-21 LAB — GLUCOSE, CAPILLARY
GLUCOSE-CAPILLARY: 171 mg/dL — AB (ref 65–99)
GLUCOSE-CAPILLARY: 198 mg/dL — AB (ref 65–99)
GLUCOSE-CAPILLARY: 240 mg/dL — AB (ref 65–99)
Glucose-Capillary: 165 mg/dL — ABNORMAL HIGH (ref 65–99)
Glucose-Capillary: 194 mg/dL — ABNORMAL HIGH (ref 65–99)
Glucose-Capillary: 193 mg/dL — ABNORMAL HIGH (ref 65–99)

## 2016-08-21 LAB — RETICULOCYTES
RBC.: 3.06 MIL/uL — AB (ref 4.22–5.81)
Retic Count, Absolute: 312.1 10*3/uL — ABNORMAL HIGH (ref 19.0–186.0)
Retic Ct Pct: 10.2 % — ABNORMAL HIGH (ref 0.4–3.1)

## 2016-08-21 LAB — LACTATE DEHYDROGENASE: LDH: 597 U/L — ABNORMAL HIGH (ref 98–192)

## 2016-08-21 LAB — CBC
HCT: 26.6 % — ABNORMAL LOW (ref 39.0–52.0)
Hemoglobin: 9.3 g/dL — ABNORMAL LOW (ref 13.0–17.0)
MCH: 30.4 pg (ref 26.0–34.0)
MCHC: 35 g/dL (ref 30.0–36.0)
MCV: 86.9 fL (ref 78.0–100.0)
PLATELETS: 91 10*3/uL — AB (ref 150–400)
RBC: 3.06 MIL/uL — AB (ref 4.22–5.81)
RDW: 20.9 % — ABNORMAL HIGH (ref 11.5–15.5)
WBC: 8.3 10*3/uL (ref 4.0–10.5)

## 2016-08-21 LAB — FIBRINOGEN: Fibrinogen: 76 mg/dL — CL (ref 210–475)

## 2016-08-21 NOTE — Progress Notes (Signed)
  Echocardiogram 2D Echocardiogram has been performed.  Fred [name] M 08/21/2016, 10:44 AM

## 2016-08-21 NOTE — Progress Notes (Signed)
[facility] PROGRESS NOTE  RODNY KOSAK [id number] DOB: 06/27/1941 DOA: 08/16/2016 PCP: [name], MD  Interim summary and HPI 76 y.o. male with medical history significant of esophageal cancer, status post chemo and radiation therapy, status post esophagectomy, cervical esophagogastrostomy and pyloroplasty, and J-tube placement; also with GERD, hyperlipidemia, hypertension who presented to the counseling center with complaints of weakness, low-grade fevers, chills and emesis since yesterday. He denies headaches, sore throat, rhinorrhea, cough, chest pain, abdominal pain, diarrhea, hematochezia, dysuria and hematuria. Patient endorses some dark stools seen; but not sure for how long. Admitted due to low Hgb and symptomatic anemia. Oncology service concerned for hemolytic anemia/DIC.  Assessment/Plan: Fever -unclear etiology, but with concerns for PNA -no fever over 72 hours now. Will stop broad-spectrum IV antibiotics and narrow to Levaquin -Follow-up blood cultures and sensitivity. -patient with CXR suggesting reaccumulation of pleural effusion and also atelectasis vs PNA. -will continue supportive care, given no resp complaints will hold on thoracentesis as discussed with Dr. Julien Nordmann. -Per Dr. Burr Medico notes will order thoracentesis for diagnosis of left pleural effusion  -2D echo ordered and no major abnormalities suggesting endocarditis.  Symptomatic anemia and thrombocytopenia -secondary to hemolytic anemia -Received 2 units of packed RBCs on 1/25 and 2 more units on 1/28 -Hgb 9.3 now -Coombs test was negative. But with elevated LDH, low haptoglobin, elevated bilirubin, low fibrinogen and some schistocytes. With high concerns for hemolytic anemia. -per oncology rec's started on high dose prednisone  -will Monitor hematocrit and hemoglobin and transfuse if Hgb less than 8 -GI consulted for potential EGD; but holding on that for now -patient started on PPI, now transition  to PO -continue supportive care -Monitor platelet levels. -transfuse for less < 15,000 or signs of active bleeding   HTN (hypertension) -Continue atenolol 25 mg daily and 12.5 mg by mouth at bedtime. -BP is well controlled and stable  Malnutrition ([place]) -Continue Osmolite feedings through J-tube.  Esophageal cancer ([place]) Continue follow-up as a schedule by oncology and CVTS as an outpatient.  Hyperlipidemia -continue Holding simvastatin due to abnormal LFTs on admission.  -LFT's improving/trending down and with no abnormalities seen on biliary tract/gallbladder US    Code Status: Full Family Communication: wife and grandson at bedside  Disposition Plan: remains inpatient, will follow blood cx's, continue observation on narrowed abx's and follow Hgb trend. Oncology has ordered ADAMS 13 slightly low and further DIC tests, will follow rec's..    Consultants:  Oncology  GI  Procedures:  See below for x-ray reports   2-D echo - Left ventricle: The cavity size was normal. Wall thickness was   normal. Systolic function was vigorous. The estimated ejection   fraction was in the range of 65% to 70%. Wall motion was normal;   there were no regional wall motion abnormalities. - Aortic valve: There was mild to moderate regurgitation directed   centrally in the LVOT. - Left atrium: There appaers to be mild compression on the   posterior and lateral wall of the left atrium by an extracardiac   mass. - Pericardium, extracardiac: There was a left pleural effusion.  Antibiotics:  vanc and cefepime 08/16/16>>>08/20/16  Levaquin 1/29  HPI/Subjective: Afebrile, no CP, no SOB. Patient denies any further dark stools. Feeling better overall. Endorses no nausea or vomiting.  Objective: Vitals:   08/21/16 1000 08/21/16 1434  BP: (!) 127/56 (!) 132/59  Pulse: 91 80  Resp: 18 18  Temp: 98.1 F (36.7 C) 98.2 F (36.8 C)    Intake/Output  Summary (Last 24 hours) at 08/21/16  1536 Last data filed at 08/21/16 1341  Gross per 24 hour  Intake             1320 ml  Output              665 ml  Net              655 ml   Filed Weights   08/20/16 1652 08/21/16 0422  Weight: 60.3 kg (133 lb) 61.3 kg (135 lb 2.3 oz)    Exam:  General: remains afebrile. Denies CP and also SOB. Patient denies any further dark stools. Overall less fatigue and feeling ok. Reports some improvement in appetite. No nausea or vomiting reported.  Cardiovascular: S1 and S2, no rubs, no gallops  Respiratory: no wheezing, no crackles, decrease Breath sounds at bases (mainly left side)  Abdomen: tube feeding in place, no erythema or drainage seen, abdominal exam is benign, no guarding, positive BS  Musculoskeletal: no edema, no cyanosis   Data Reviewed: Basic Metabolic Panel:  Recent Labs Lab 08/15/16 1230 08/16/16 1206 08/17/16 0424 08/18/16 0503 08/19/16 0453  NA 137 136 134* 132*  --   K 4.8 4.9 4.2 4.0  --   CL  --   --  102 100*  --   CO2 23 25 25 25   --   GLUCOSE 137 171* 223* 188*  --   BUN 30.1* 29.4* 28* 19  --   CREATININE 0.7 0.7 0.53* 0.46* 0.48*  CALCIUM 9.4 9.3 8.4* 8.1*  --    Liver Function Tests:  Recent Labs Lab 08/15/16 1230 08/16/16 1206 08/17/16 0424 08/18/16 0503  AST 138* 129* 103* 84*  ALT 43 55 52 62  ALKPHOS 381* 432* 346* 326*  BILITOT 2.21* 1.8*  2.16* 1.7* 1.4*  PROT 6.8 6.7 5.7* 5.3*  ALBUMIN 3.8 3.6 3.4* 2.9*   CBC:  Recent Labs Lab 08/15/16 1230 08/16/16 1206  08/16/16 1854 08/17/16 0424 08/17/16 1722 08/18/16 0503 08/19/16 0453 08/20/16 0422 08/21/16 0425  WBC 9.1 7.9  < > 6.6 6.4 5.2 4.6 6.7 6.6 8.3  NEUTROABS 6.6* 5.9  --  4.5 4.4  --  2.9  --   --   --   HGB 7.4* 6.5*  < > 8.9* 8.8* 8.3* 7.5* 10.8* 9.1* 9.3*  HCT 21.1* 18.9*  < > 25.7* 24.9* 23.5* 21.1* 30.6* 26.0* 26.6*  MCV 88.7 89.2  < > 86.2 87.4 85.5 87.2 84.3 85.2 86.9  PLT 78* 72*  < > 64* 69* 60* 60*  61* 84*  80* 81*  83* 91*  < > = values in this  interval not displayed.   BNP (last 3 results)  Recent Labs  05/03/16 0256  BNP 13.6   CBG:  Recent Labs Lab 08/20/16 1706 08/20/16 2002 08/21/16 0000 08/21/16 0418 08/21/16 0756  GLUCAP 165* 194* 240* 198* 193*    Recent Results (from the past 240 hour(s))  Urine Culture     Status: None   Collection Time: 08/15/16 12:24 PM  Result Value Ref Range Status   Urine Culture, Routine Final report  Final   Urine Culture result 1 Comment  Final    Comment: Culture shows less than 10,000 colony forming units of bacteria per milliliter of urine. This colony count is not generally considered to be clinically significant.   Culture, blood (single) w Reflex to ID Panel     Status: None   Collection Time: 08/15/16 12:24 PM  Result Value Ref Range Status   BLOOD CULTURE, ROUTINE Final report  Final   RESULT 1 Comment  Final    Comment: No aerobic or anaerobic growth in five days.  TECHNOLOGIST REVIEW     Status: None   Collection Time: 08/15/16 12:30 PM  Result Value Ref Range Status   Technologist Review Metas and Myelocytes present. 9% nrbc. sl poly  Final  TECHNOLOGIST REVIEW     Status: None   Collection Time: 08/16/16 12:06 PM  Result Value Ref Range Status   Technologist Review Metas and Myelocytes present  Final     Studies: No results found.  Scheduled Meds: . sodium chloride   Intravenous Once  . atenolol  12.5 mg Oral QHS  . atenolol  25 mg Oral Daily  . feeding supplement (OSMOLITE 1.5 CAL)  1,000 mL Per Tube Q24H  . folic acid  1 mg Oral Daily  . free water  120 mL Per Tube BID  . levofloxacin  750 mg Oral Daily  . pantoprazole  40 mg Oral Q0600  . predniSONE  60 mg Oral Q breakfast  . vitamin B-12  1,000 mcg Oral Daily   Continuous Infusions:  Principal Problem:   Fever Active Problems:   HTN (hypertension)   Malnutrition (H[facility])   Esophageal cancer (H[facility])   Hyperlipidemia   Symptomatic anemia   Thrombocytopenia ([facility])   Acquired hemolytic anemia  (HCC)   Pleural effusion   Heme positive stool    Time spent: 25 minutes    Barton Dubois  [facility] Pager [phone]. If 7PM-7AM, please contact night-coverage at [email or web address], password [email or web address] 08/21/2016, 3:36 PM  LOS: 5 days

## 2016-08-21 NOTE — Progress Notes (Signed)
CRITICAL VALUE ALERT  Critical value received: Fibrinogen - 75  Date of notification:  08/21/2016  Time of notification:  0655  Critical value read back: yes  Nurse who received alert:  Tor Netters  MD notified (1st page):  Dr. Dyann Kief  Time of first page:  0705  MD notified (2nd page):  Time of second page:  Responding MD:  Dr. Dyann Kief  Time MD responded:  [phone]

## 2016-08-21 NOTE — Progress Notes (Signed)
Fred [name]   DOB:11/26/1940   [id number]   [id number]  Hematology and Oncology follow up  Subjective: Patient feels better today, more energy, no dyspnea when walked in the hallway. His hemoglobin has been stable, afebrile, no other new complaints.  Objective:  Vitals:   08/21/16 2139 08/21/16 2246  BP: (!) 129/55 (!) 121/55  Pulse:  (!) 104  Resp:  18  Temp:  98.1 F (36.7 C)    Body mass index is 22.49 kg/m.  Intake/Output Summary (Last 24 hours) at 08/21/16 2301 Last data filed at 08/21/16 2139  Gross per 24 hour  Intake          1316.25 ml  Output              665 ml  Net           651.25 ml     Sclerae unicteric  Oropharynx clear  No peripheral adenopathy  Lungs clear -- no rales or rhonchi  Heart regular rate and rhythm  Abdomen benign  MSK no focal spinal tenderness, no peripheral edema  Neuro nonfocal   CBG (last 3)   Recent Labs  08/21/16 0418 08/21/16 0756 08/21/16 1155  GLUCAP 198* 193* 171*     Labs:  Lab Results  Component Value Date   WBC 8.3 08/21/2016   HGB 9.3 (L) 08/21/2016   HCT 26.6 (L) 08/21/2016   MCV 86.9 08/21/2016   PLT 91 (L) 08/21/2016   NEUTROABS 2.9 08/18/2016   CMP Latest Ref Rng & Units 08/19/2016 08/18/2016 08/17/2016  Glucose 65 - 99 mg/dL - 188(H) 223(H)  BUN 6 - 20 mg/dL - 19 28(H)  Creatinine 0.61 - 1.24 mg/dL 0.48(L) 0.46(L) 0.53(L)  Sodium 135 - 145 mmol/L - 132(L) 134(L)  Potassium 3.5 - 5.1 mmol/L - 4.0 4.2  Chloride 101 - 111 mmol/L - 100(L) 102  CO2 22 - 32 mmol/L - 25 25  Calcium 8.9 - 10.3 mg/dL - 8.1(L) 8.4(L)  Total Protein 6.5 - 8.1 g/dL - 5.3(L) 5.7(L)  Total Bilirubin 0.3 - 1.2 mg/dL - 1.4(H) 1.7(H)  Alkaline Phos 38 - 126 U/L - 326(H) 346(H)  AST 15 - 41 U/L - 84(H) 103(H)  ALT 17 - 63 U/L - 62 52    Urine Studies No results for input(s): UHGB, CRYS in the last 72 hours.  Invalid input(s): UACOL, UAPR, USPG, UPH, UTP, UGL, UKET, UBIL, UNIT, UROB, ULEU, UEPI, UWBC, URBC, UBAC, CAST, [place],  [place]   Basic Metabolic Panel:  Recent Labs Lab 08/15/16 1230 08/16/16 1206 08/17/16 0424 08/18/16 0503 08/19/16 0453  NA 137 136 134* 132*  --   K 4.8 4.9 4.2 4.0  --   CL  --   --  102 100*  --   CO2 23 25 25 25   --   GLUCOSE 137 171* 223* 188*  --   BUN 30.1* 29.4* 28* 19  --   CREATININE 0.7 0.7 0.53* 0.46* 0.48*  CALCIUM 9.4 9.3 8.4* 8.1*  --    GFR Estimated Creatinine Clearance: 68.1 mL/min (by C-G formula based on SCr of 0.48 mg/dL (L)). Liver Function Tests:  Recent Labs Lab 08/15/16 1230 08/16/16 1206 08/17/16 0424 08/18/16 0503  AST 138* 129* 103* 84*  ALT 43 55 52 62  ALKPHOS 381* 432* 346* 326*  BILITOT 2.21* 1.8*  2.16* 1.7* 1.4*  PROT 6.8 6.7 5.7* 5.3*  ALBUMIN 3.8 3.6 3.4* 2.9*   No results for input(s): LIPASE, AMYLASE in the last  168 hours. No results for input(s): AMMONIA in the last 168 hours. Coagulation profile  Recent Labs Lab 08/16/16 1206 08/17/16 0424 08/18/16 0503 08/19/16 0453 08/20/16 0422  INR 1.2 1.42 1.44 1.29 1.52    CBC:  Recent Labs Lab 08/15/16 1230 08/16/16 1206  08/16/16 1854 08/17/16 0424 08/17/16 1722 08/18/16 0503 08/19/16 0453 08/20/16 0422 08/21/16 0425  WBC 9.1 7.9  < > 6.6 6.4 5.2 4.6 6.7 6.6 8.3  NEUTROABS 6.6* 5.9  --  4.5 4.4  --  2.9  --   --   --   HGB 7.4* 6.5*  < > 8.9* 8.8* 8.3* 7.5* 10.8* 9.1* 9.3*  HCT 21.1* 18.9*  < > 25.7* 24.9* 23.5* 21.1* 30.6* 26.0* 26.6*  MCV 88.7 89.2  < > 86.2 87.4 85.5 87.2 84.3 85.2 86.9  PLT 78* 72*  < > 64* 69* 60* 60*  61* 84*  80* 81*  83* 91*  < > = values in this interval not displayed. Cardiac Enzymes: No results for input(s): CKTOTAL, CKMB, CKMBINDEX, TROPONINI in the last 168 hours. BNP: Invalid input(s): POCBNP CBG:  Recent Labs Lab 08/20/16 2002 08/21/16 0000 08/21/16 0418 08/21/16 0756 08/21/16 1155  GLUCAP 194* 240* 198* 193* 171*   D-Dimer  Recent Labs  08/19/16 0453 08/20/16 0422  DDIMER >20.00* >20.00*   Hgb A1c No results  for input(s): HGBA1C in the last 72 hours. Lipid Profile No results for input(s): CHOL, HDL, LDLCALC, TRIG, CHOLHDL, LDLDIRECT in the last 72 hours. Thyroid function studies No results for input(s): TSH, T4TOTAL, T3FREE, THYROIDAB in the last 72 hours.  Invalid input(s): FREET3 Anemia work up  [employer]  08/20/16 0422 08/21/16 0425  RETICCTPCT 8.9* 10.2*   Microbiology Recent Results (from the past 240 hour(s))  Urine Culture     Status: None   Collection Time: 08/15/16 12:24 PM  Result Value Ref Range Status   Urine Culture, Routine Final report  Final   Urine Culture result 1 Comment  Final    Comment: Culture shows less than 10,000 colony forming units of bacteria per milliliter of urine. This colony count is not generally considered to be clinically significant.   Culture, blood (single) w Reflex to ID Panel     Status: None   Collection Time: 08/15/16 12:24 PM  Result Value Ref Range Status   BLOOD CULTURE, ROUTINE Final report  Final   RESULT 1 Comment  Final    Comment: No aerobic or anaerobic growth in five days.  TECHNOLOGIST REVIEW     Status: None   Collection Time: 08/15/16 12:30 PM  Result Value Ref Range Status   Technologist Review Metas and Myelocytes present. 9% nrbc. sl poly  Final  TECHNOLOGIST REVIEW     Status: None   Collection Time: 08/16/16 12:06 PM  Result Value Ref Range Status   Technologist Review Metas and Myelocytes present  Final      Studies:  No results found.  Assessment: 76 y.o. with past medical history of distal esophageal adenocarcinoma, stage IIIa, status post concurrent chemotherapy and radiation, and esophagectomy in October 2017, presented with worsening anemia, and mild low-grade fever.  1. Hemolytic anemia and moderate thrombocytopenia, secondary to DIC 2. Low grade fever, ? pulmonary infection, on broad antibiotics  3. History of distal esophagus adenocarcinoma, status post concurrent neoadjuvant chemoirradiation, and  esophagectomy, NED on PET 08/14/2016.  4. Elevated AST and bilirubin, likely secondary to hemolysis, no evidence of biliary obstruction or cholecystitis 5. Recurrent left pleural  effusion 6. Hypertension 7. Malnutrition, anorexia, on tube feeds, OK for oral intake    Plan:  -His hemoglobin has been stable, thrombocytopenia slowly improving. But his LDH and reticulocyte count remains still significantly elevated, fibrinogen continue trending down, indicates ongoing DIC, and the cause of DIC is still unclear to me  -his echo was unremarkable today  -I recommend repeating left thoracentesis, to ruled out infection, hemorrhage, and malignancy  -may also consider CTA to rule out aneurysm or aortic dissection as work up for DIC  -will stop his prednisone, I do not think it has helped much for his DIC  -continue monitoring CBC daily -if his above work up remains negative, and his CBC stable, OK to discharge from hematology standpoint, and I will monitor his labs closely and follow up  -continue antibiotics to complete a full course  -will follow up daily. I need to know his planned discharge date so I can inform his [facility] to get his hospitalization authorized   Truitt Merle, MD 08/21/2016

## 2016-08-22 ENCOUNTER — Inpatient Hospital Stay (HOSPITAL_COMMUNITY): Payer: Non-veteran care

## 2016-08-22 ENCOUNTER — Encounter (HOSPITAL_COMMUNITY): Payer: Self-pay | Admitting: Radiology

## 2016-08-22 DIAGNOSIS — R195 Other fecal abnormalities: Secondary | ICD-10-CM

## 2016-08-22 DIAGNOSIS — C155 Malignant neoplasm of lower third of esophagus: Secondary | ICD-10-CM

## 2016-08-22 DIAGNOSIS — R509 Fever, unspecified: Secondary | ICD-10-CM

## 2016-08-22 DIAGNOSIS — I10 Essential (primary) hypertension: Secondary | ICD-10-CM

## 2016-08-22 DIAGNOSIS — D65 Disseminated intravascular coagulation [defibrination syndrome]: Principal | ICD-10-CM

## 2016-08-22 LAB — BODY FLUID CELL COUNT WITH DIFFERENTIAL
Eos, Fluid: 0 %
Lymphs, Fluid: 23 %
Monocyte-Macrophage-Serous Fluid: 65 % (ref 50–90)
NEUTROPHIL FLUID: 12 % (ref 0–25)
Total Nucleated Cell Count, Fluid: 58 cu mm (ref 0–1000)

## 2016-08-22 LAB — COMPREHENSIVE METABOLIC PANEL
ALT: 48 U/L (ref 17–63)
AST: 50 U/L — AB (ref 15–41)
Albumin: 3.1 g/dL — ABNORMAL LOW (ref 3.5–5.0)
Alkaline Phosphatase: 299 U/L — ABNORMAL HIGH (ref 38–126)
Anion gap: 10 (ref 5–15)
BUN: 25 mg/dL — AB (ref 6–20)
CHLORIDE: 99 mmol/L — AB (ref 101–111)
CO2: 26 mmol/L (ref 22–32)
CREATININE: 0.59 mg/dL — AB (ref 0.61–1.24)
Calcium: 8.6 mg/dL — ABNORMAL LOW (ref 8.9–10.3)
GFR calc Af Amer: >60 mL/min (ref >60)
GLUCOSE: 205 mg/dL — AB (ref 65–99)
Potassium: 4.2 mmol/L (ref 3.5–5.1)
Sodium: 135 mmol/L (ref 135–145)
Total Bilirubin: 1.5 mg/dL — ABNORMAL HIGH (ref 0.3–1.2)
Total Protein: 5.4 g/dL — ABNORMAL LOW (ref 6.5–8.1)

## 2016-08-22 LAB — GLUCOSE, CAPILLARY
GLUCOSE-CAPILLARY: 207 mg/dL — AB (ref 65–99)
Glucose-Capillary: 195 mg/dL — ABNORMAL HIGH (ref 65–99)
Glucose-Capillary: 226 mg/dL — ABNORMAL HIGH (ref 65–99)
Glucose-Capillary: 235 mg/dL — ABNORMAL HIGH (ref 65–99)

## 2016-08-22 LAB — LACTATE DEHYDROGENASE, PLEURAL OR PERITONEAL FLUID: LD, Fluid: 192 U/L — ABNORMAL HIGH (ref 3–23)

## 2016-08-22 LAB — CBC
HCT: 25.3 % — ABNORMAL LOW (ref 39.0–52.0)
Hemoglobin: 8.9 g/dL — ABNORMAL LOW (ref 13.0–17.0)
MCH: 30.8 pg (ref 26.0–34.0)
MCHC: 35.2 g/dL (ref 30.0–36.0)
MCV: 87.5 fL (ref 78.0–100.0)
PLATELETS: 90 10*3/uL — AB (ref 150–400)
RBC: 2.89 MIL/uL — ABNORMAL LOW (ref 4.22–5.81)
RDW: 21.3 % — AB (ref 11.5–15.5)
WBC: 9.4 10*3/uL (ref 4.0–10.5)

## 2016-08-22 MED ORDER — FOLIC ACID 1 MG PO TABS: 1.0000 mg | ORAL_TABLET | Freq: Every day | ORAL | 0 refills | Status: AC

## 2016-08-22 MED ORDER — MORPHINE SULFATE (PF) 2 MG/ML IV SOLN: 1.0000 mg | Freq: Once | INTRAVENOUS | Status: DC | PRN

## 2016-08-22 MED ORDER — IOPAMIDOL (ISOVUE-370) INJECTION 76%
INTRAVENOUS | Status: AC
  Filled 2016-08-22: qty 100

## 2016-08-22 MED ORDER — SODIUM CHLORIDE 0.9 % IJ SOLN
INTRAMUSCULAR | Status: AC
  Filled 2016-08-22: qty 50

## 2016-08-22 MED ORDER — INSULIN ASPART 100 UNIT/ML ~~LOC~~ SOLN
0.0000 [IU] | SUBCUTANEOUS | Status: DC
  Administered 2016-08-23 (×2): 2 [IU] via SUBCUTANEOUS

## 2016-08-22 MED ORDER — TRAMADOL HCL 50 MG PO TABS: 50.0000 mg | ORAL_TABLET | Freq: Once | ORAL | Status: DC | PRN

## 2016-08-22 MED ORDER — IOPAMIDOL (ISOVUE-370) INJECTION 76%
100.0000 mL | Freq: Once | INTRAVENOUS | Status: AC | PRN
Start: 2016-08-22 — End: 2016-08-22
  Administered 2016-08-22: 100 mL via INTRAVENOUS

## 2016-08-22 MED ORDER — CYANOCOBALAMIN 1000 MCG PO TABS: 1000.0000 ug | ORAL_TABLET | Freq: Every day | ORAL | 0 refills | Status: AC

## 2016-08-22 NOTE — Progress Notes (Signed)
PROGRESS NOTE    Fred [name]  [id number] DOB: 28-Jan-1941 DOA: 08/16/2016 PCP: [name], MD     Brief Narrative:  Fred [name] is a 76 y.o.malewith medical history significant of esophageal cancer, status post chemo and radiation therapy, status post esophagectomy, cervical esophagogastrostomy and pyloroplasty, and J-tube placement; also with GERD, hyperlipidemia, hypertension who presented to the counseling center with complaints of weakness, low-grade fevers, chills and emesis. He was admitted for DIC with hematology consult.   Assessment & Plan:   Principal Problem:   Fever Active Problems:   HTN (hypertension)   Malnutrition (HCC)   Esophageal cancer (HCC)   Hyperlipidemia   Symptomatic anemia   Thrombocytopenia (HCC)   Acquired hemolytic anemia (HCC)   Pleural effusion   Heme positive stool   DIC (disseminated intravascular coagulation) (HCC)  DIC, hemolytic anemia and thrombocytopenia  -Unclear etiology  -Received 2 units of packed RBCs on 1/25 and 2 more units on 1/28 -Coombs test was negative. But with elevated LDH, low haptoglobin, elevated bilirubin, low fibrinogen and some schistocytes. With high concerns for hemolytic anemia. -GI consulted for potential EGD; but holding on that for now as patient without clinically relevant GI bleed.  -Oncology following. Stop prednisone.  -CTA chest/abd obtained, negative for aneurysm or dissection  -Hgb and platelets now stable  -Will need to follow with oncology as outpatient   Fever -Unclear etiology, but with concerns for PNA. Broad spectrum antibiotics were stopped and patient started on levaquin, would continue for total 10 days last dose 2/3  -2D echo ordered and no major abnormalities suggesting endocarditis -Left thoracentesis today, fluid studies pending. Fluid LDH 192, consistent with exudative effusion (>2/3 ULN)  HTN  -Continue atenolol 25 mg daily and 12.5 mg by mouth at bedtime. -BP is well  controlled and stable  Moderate malnutrition in context of chronic illness -Continue Osmolite feedings through J-tube  Esophageal cancer  -Continue follow-up as scheduled by oncology and CVTS as an outpatient.  Hyperlipidemia -continue Holding simvastatin due to abnormal LFTs on admission.  -LFT's improving/trending down and with no abnormalities seen on biliary tract/gallbladder US    DVT prophylaxis: SCDs Code Status: full Family Communication: no family at bedside Disposition Plan: pending stabilization after thoracentesis today. Discharge home tomorrow 2/1    Consultants:   Oncology  GI  Procedures:   Left thoracentesis 1/31  Antimicrobials:  Anti-infectives    Start     Dose/Rate Route Frequency Ordered Stop   08/20/16 2000  vancomycin (VANCOCIN) IVPB 1000 mg/200 mL premix  Status:  Discontinued     1,000 mg 200 mL/hr over 60 Minutes Intravenous Every 12 hours 08/20/16 1406 08/20/16 1635   08/20/16 1800  levofloxacin (LEVAQUIN) tablet 750 mg     750 mg Oral Daily 08/20/16 1635     08/17/16 0800  vancomycin (VANCOCIN) IVPB 750 mg/150 ml premix  Status:  Discontinued     750 mg 150 mL/hr over 60 Minutes Intravenous Every 12 hours 08/16/16 2030 08/20/16 1406   08/17/16 0400  ceFEPIme (MAXIPIME) 1 g in dextrose 5 % 50 mL IVPB  Status:  Discontinued     1 g 100 mL/hr over 30 Minutes Intravenous Every 8 hours 08/16/16 2030 08/20/16 1635   08/16/16 1845  ceFEPIme (MAXIPIME) 1 g in dextrose 5 % 50 mL IVPB     1 g 100 mL/hr over 30 Minutes Intravenous  Once 08/16/16 1834 08/16/16 2049   08/16/16 1845  vancomycin (VANCOCIN) IVPB 1000 mg/200 mL premix  1,000 mg 200 mL/hr over 60 Minutes Intravenous  Once 08/16/16 1834 08/16/16 2120       Subjective: Patient without complaints today. No acute events. No fevers, CP, tolerating tube feeding and ambulating well.   Objective: Vitals:   08/22/16 1120 08/22/16 1130 08/22/16 1214 08/22/16 1432  BP: 115/63 (!)  110/59 (!) 114/56 115/61  Pulse:   80 78  Resp:   16 14  Temp:   98.2 F (36.8 C) 98.9 F (37.2 C)  TempSrc:   Oral Oral  SpO2:   100% 100%  Weight:      Height:        Intake/Output Summary (Last 24 hours) at 08/22/16 1509 Last data filed at 08/22/16 1039  Gross per 24 hour  Intake          1983.75 ml  Output              642 ml  Net          1341.75 ml   Filed Weights   08/20/16 1652 08/21/16 0422 08/22/16 0600  Weight: 60.3 kg (133 lb) 61.3 kg (135 lb 2.3 oz) 62.2 kg (137 lb 2 oz)    Examination:  General exam: Appears calm and comfortable  Respiratory system: Clear to auscultation, diminished left base. Respiratory effort normal. Cardiovascular system: S1 & S2 heard, RRR. No JVD, murmurs, rubs, gallops or clicks. No pedal edema. Gastrointestinal system: Abdomen is nondistended, soft and nontender. PEG in place. No organomegaly or masses felt. Normal bowel sounds heard. Central nervous system: Alert and oriented. No focal neurological deficits. Extremities: Symmetric 5 x 5 power. Skin: No rashes, lesions or ulcers Psychiatry: Judgement and insight appear normal. Mood & affect appropriate.   Data Reviewed: I have personally reviewed following labs and imaging studies  CBC:  Recent Labs Lab 08/16/16 1206  08/16/16 1854 08/17/16 0424  08/18/16 0503 08/19/16 0453 08/20/16 0422 08/21/16 0425 08/22/16 0420  WBC 7.9  < > 6.6 6.4  < > 4.6 6.7 6.6 8.3 9.4  NEUTROABS 5.9  --  4.5 4.4  --  2.9  --   --   --   --   HGB 6.5*  < > 8.9* 8.8*  < > 7.5* 10.8* 9.1* 9.3* 8.9*  HCT 18.9*  < > 25.7* 24.9*  < > 21.1* 30.6* 26.0* 26.6* 25.3*  MCV 89.2  < > 86.2 87.4  < > 87.2 84.3 85.2 86.9 87.5  PLT 72*  < > 64* 69*  < > 60*  61* 84*  80* 81*  83* 91* 90*  < > = values in this interval not displayed. Basic Metabolic Panel:  Recent Labs Lab 08/16/16 1206 08/17/16 0424 08/18/16 0503 08/19/16 0453 08/22/16 0420  NA 136 134* 132*  --  135  K 4.9 4.2 4.0  --  4.2  CL  --   102 100*  --  99*  CO2 25 25 25   --  26  GLUCOSE 171* 223* 188*  --  205*  BUN 29.4* 28* 19  --  25*  CREATININE 0.7 0.53* 0.46* 0.48* 0.59*  CALCIUM 9.3 8.4* 8.1*  --  8.6*   GFR: Estimated Creatinine Clearance: 68.3 mL/min (by C-G formula based on SCr of 0.59 mg/dL (L)). Liver Function Tests:  Recent Labs Lab 08/16/16 1206 08/17/16 0424 08/18/16 0503 08/22/16 0420  AST 129* 103* 84* 50*  ALT 55 52 62 48  ALKPHOS 432* 346* 326* 299*  BILITOT 1.8*  2.16* 1.7* 1.4*  1.5*  PROT 6.7 5.7* 5.3* 5.4*  ALBUMIN 3.6 3.4* 2.9* 3.1*   No results for input(s): LIPASE, AMYLASE in the last 168 hours. No results for input(s): AMMONIA in the last 168 hours. Coagulation Profile:  Recent Labs Lab 08/16/16 1206 08/17/16 0424 08/18/16 0503 08/19/16 0453 08/20/16 0422  INR 1.2 1.42 1.44 1.29 1.52   Cardiac Enzymes: No results for input(s): CKTOTAL, CKMB, CKMBINDEX, TROPONINI in the last 168 hours. BNP (last 3 results) No results for input(s): PROBNP in the last 8760 hours. HbA1C: No results for input(s): HGBA1C in the last 72 hours. CBG:  Recent Labs Lab 08/21/16 1155 08/21/16 1603 08/21/16 2115 08/21/16 2357 08/22/16 0628  GLUCAP 171* 207* 226* 235* 195*   Lipid Profile: No results for input(s): CHOL, HDL, LDLCALC, TRIG, CHOLHDL, LDLDIRECT in the last 72 hours. Thyroid Function Tests: No results for input(s): TSH, T4TOTAL, FREET4, T3FREE, THYROIDAB in the last 72 hours. Anemia Panel:  Recent Labs  08/20/16 0422 08/21/16 0425  RETICCTPCT 8.9* 10.2*   Sepsis Labs: No results for input(s): PROCALCITON, LATICACIDVEN in the last 168 hours.  Recent Results (from the past 240 hour(s))  Urine Culture     Status: None   Collection Time: 08/15/16 12:24 PM  Result Value Ref Range Status   Urine Culture, Routine Final report  Final   Urine Culture result 1 Comment  Final    Comment: Culture shows less than 10,000 colony forming units of bacteria per milliliter of urine.  This colony count is not generally considered to be clinically significant.   Culture, blood (single) w Reflex to ID Panel     Status: None   Collection Time: 08/15/16 12:24 PM  Result Value Ref Range Status   BLOOD CULTURE, ROUTINE Final report  Final   RESULT 1 Comment  Final    Comment: No aerobic or anaerobic growth in five days.  TECHNOLOGIST REVIEW     Status: None   Collection Time: 08/15/16 12:30 PM  Result Value Ref Range Status   Technologist Review Metas and Myelocytes present. 9% nrbc. sl poly  Final  TECHNOLOGIST REVIEW     Status: None   Collection Time: 08/16/16 12:06 PM  Result Value Ref Range Status   Technologist Review Metas and Myelocytes present  Final       Radiology Studies: Dg Chest 1 View  Result Date: 08/22/2016 CLINICAL DATA:  Post left thoracentesis EXAM: CHEST 1 VIEW COMPARISON:  08/16/2016 FINDINGS: Decreasing left effusion following thoracentesis. Small left effusion remains. No pneumothorax. Left base atelectasis. Right lung is clear. Heart is normal size. No acute bony abnormality. IMPRESSION: Small left pleural effusion, decreased following thoracentesis. No pneumothorax. Left base atelectasis. Electronically Signed   By: Rolm Baptise M.D.   On: 08/22/2016 11:52   Ct Angio Chest/abd/pel For Dissection W And/or W/wo  Result Date: 08/22/2016 CLINICAL DATA:  History of esophageal cancer. Pleural effusion. Rule out aneurysm or dissection. EXAM: CT ANGIOGRAPHY CHEST, ABDOMEN AND PELVIS TECHNIQUE: Multidetector CT imaging through the chest, abdomen and pelvis was performed using the standard protocol during bolus administration of intravenous contrast. Multiplanar reconstructed images and MIPs were obtained and reviewed to evaluate the vascular anatomy. CONTRAST:  100 cc Isovue 370 IV COMPARISON:  PET CT 08/14/2016 FINDINGS: CTA CHEST FINDINGS Cardiovascular: Aorta is normal caliber. No dissection. Heart is borderline in size. No filling defects in the  pulmonary arteries to suggest pulmonary emboli. Mediastinum/Nodes: Changes of prior esophagectomy with gastric pull-through, stable appearance. No mediastinal, hilar, or axillary  adenopathy. Lungs/Pleura: Large left pleural effusion with compressive atelectasis in the left lower lobe, unchanged. Right lung is clear. Trace right pleural effusion. Musculoskeletal: No acute bony abnormality. Review of the MIP images confirms the above findings. CTA ABDOMEN AND PELVIS FINDINGS VASCULAR Aorta: Normal caliber.  No dissection. Celiac: Widely patent SMA: Widely patent Renals: Single bilaterally, widely patent IMA: Widely patent Inflow: Widely patent Veins: Grossly unremarkable. Review of the MIP images confirms the above findings. NON-VASCULAR Hepatobiliary: No focal hepatic abnormality. Gallbladder unremarkable. Pancreas: Mildly atrophic pancreas. No focal abnormality or ductal dilatation. Spleen: No focal abnormality.  Normal size. Adrenals/Urinary Tract: Small nodule again noted in the left adrenal gland, shown to be hypermetabolic on prior PET CT. This measures approximately 17 mm, unchanged. Right adrenal gland is unremarkable. Bilateral renal cysts. No hydronephrosis. Urinary bladder grossly unremarkable. Stomach/Bowel: Scattered sigmoid diverticulosis. No active diverticulitis. Stomach and small bowel decompressed. Jejunostomy feeding tube in place within the left small bowel loops. Lymphatic: No adenopathy. Reproductive: Prostate enlargement with central calcifications. Other: No free fluid or free air. Musculoskeletal: No acute bony abnormality. Review of the MIP images confirms the above findings. IMPRESSION: No evidence of aortic aneurysm or dissection. No evidence of pulmonary embolus. Heart is borderline in size. Stable small left adrenal nodule which was hypermetabolic on prior PET CT. Stable large left pleural effusion with compressive atelectasis in the left lower lobe. Stable appearance of the gastric  pull-through post esophagectomy. Electronically Signed   By: Rolm Baptise M.D.   On: 08/22/2016 12:30   US Thoracentesis Asp Pleural Space W/img Guide  Result Date: 08/22/2016 INDICATION: Esophageal cancer, recurrent left pleural effusion. Request made for diagnostic and therapeutic left thoracentesis. EXAM: ULTRASOUND GUIDED DIAGNOSTIC AND THERAPEUTIC LEFT THORACENTESIS MEDICATIONS: None. COMPLICATIONS: None immediate. PROCEDURE: An ultrasound guided thoracentesis was thoroughly discussed with the patient and questions answered. The benefits, risks, alternatives and complications were also discussed. The patient understands and wishes to proceed with the procedure. Written consent was obtained. Ultrasound was performed to localize and mark an adequate pocket of fluid in the left chest. The area was then prepped and draped in the normal sterile fashion. 1% Lidocaine was used for local anesthesia. Under ultrasound guidance a Safe-T-Centesis catheter was introduced. Thoracentesis was performed. The catheter was removed and a dressing applied. FINDINGS: A total of approximately 1 liter of yellow fluid was removed. Samples were sent to the laboratory as requested by the clinical team. IMPRESSION: Successful ultrasound guided diagnostic and therapeutic left thoracentesis yielding 1 liter of pleural fluid. Read by: Rowe Robert, PA-C Electronically Signed   By: Sandi Mariscal M.D.   On: 08/22/2016 11:38      Scheduled Meds: . sodium chloride   Intravenous Once  . atenolol  12.5 mg Oral QHS  . atenolol  25 mg Oral Daily  . feeding supplement (OSMOLITE 1.5 CAL)  1,000 mL Per Tube Q24H  . folic acid  1 mg Oral Daily  . free water  120 mL Per Tube BID  . insulin aspart  0-9 Units Subcutaneous Q4H  . iopamidol      . levofloxacin  750 mg Oral Daily  . pantoprazole  40 mg Oral Q0600  . sodium chloride      . vitamin B-12  1,000 mcg Oral Daily   Continuous Infusions:   LOS: 6 days    Time spent: 40  minutes   Dessa Phi, DO [facility] [email or web address] Password [email or web address] 08/22/2016, 3:09 PM

## 2016-08-22 NOTE — Progress Notes (Signed)
Inpatient Diabetes Program Recommendations  AACE/ADA: New Consensus Statement on Inpatient Glycemic Control (2015)  Target Ranges:  Prepandial:   less than 140 mg/dL      Peak postprandial:   less than 180 mg/dL (1-2 hours)      Critically ill patients:  140 - 180 mg/dL   Lab Results  Component Value Date   GLUCAP 195 (H) 08/22/2016  Results for ZYDEN, SEEGARS (MRN [id number]) as of 08/22/2016 09:29  Ref. Range 08/21/2016 16:03 08/21/2016 21:15 08/21/2016 23:57 08/22/2016 06:28  Glucose-Capillary Latest Ref Range: 65 - 99 mg/dL 207 (H) 226 (H) 235 (H) 195 (H)    Review of Glycemic Control  Diabetes history: None Outpatient Diabetes medications: None Current orders for Inpatient glycemic control: None  Hyperglycemia with TFs. No HgbA1C available.  Inpatient Diabetes Program Recommendations:   Add Novolog 0-9 units Q4H.  Will continue to follow. Thank you. Lorenda Peck, RD, LDN, CDE Inpatient Diabetes Coordinator [phone]

## 2016-08-22 NOTE — Progress Notes (Signed)
Nutrition Follow-up  DOCUMENTATION CODES:   Non-severe (moderate) malnutrition in context of chronic illness  INTERVENTION:  Continue Osmolite 1.5 @ 75 ml/hr via j-tube over 16 hour period (2000-1200)  Tube feeding regimen provides 1800 kcal (100% of needs), 75 grams of protein, and 914 ml of H2O (1194 ml with free water flush regimen).   NUTRITION DIAGNOSIS:   Malnutrition related to chronic illness as evidenced by mild depletion of body fat, moderate depletion of body fat, mild depletion of muscle mass, moderate depletions of muscle mass.  GOAL:   Patient will meet greater than or equal to 90% of their needs  MONITOR:   PO intake, Supplement acceptance, Labs, Weight trends, TF tolerance, Skin, I & O's    ASSESSMENT:   76 y.o. male with medical history significant of esophageal cancer, status post chemo and radiation therapy, status post esophagectomy, cervical esophagogastrostomy and pyloroplasty, and J-tube placement; also with GERD, hyperlipidemia, hypertension who presented to the counseling center with complaints of weakness, low-grade fevers, chills and emesis since yesterday. He denies headaches, sore throat, rhinorrhea, cough, chest pain, abdominal pain, diarrhea, hematochezia, dysuria and hematuria. Patient endorses some dark stools seen; but not sure for how long. Admitted due to low Hgb and symptomatic anemia. Oncology service concerned for hemolytic anemia/DIC.   Met with pt's wife in room today. Pt gone to radiology for paracentesis. Pt's wife reports that pt is tolerating tube feeds well. Pt is normally on overnight feeds at home and usually gets 4 cans of Osmolite overnight. Pt followed by outpatient RD. Wife reports that pt was recently increased to 5 cans Osmolite overnight. Pt and wife are aware that they can change the tube feed rate as needed to increase tolerance as long as they get the total amount needed to meet needs. Pt has gained weight since admit and pta. Pt  with large amount of pleural effusion. Wife reports intermittent pleural effusion ever since pt's last surgery in October 2017. RD will continue to follow.     Medications reviewed and include: folic acid, insulin, protonix, Vit B-12  Labs reviewed: Cl 99(L), BUN 25(H), creat 0.59(L), Ca 8.6(L) adj. 9.32 wnl, AlkPhos 299(H), Alb 3.1(L), AST 50(H), Tbili 1.5(H) Hgb 8.9(L), Hct 25.3(L)  Diet Order:  Diet regular Room service appropriate? Yes; Fluid consistency: Thin  Skin:  Reviewed, no issues  Last BM:  1/28  Height:   Ht Readings from Last 1 Encounters:  08/20/16 _0  (1.651 m)    Weight:   Wt Readings from Last 1 Encounters:  08/22/16 137 lb 2 oz (62.2 kg)    Ideal Body Weight:  61.8 kg  BMI:  Body mass index is 22.82 kg/m.  Estimated Nutritional Needs:   Kcal:  1650-1850  Protein:  75-90 grams  Fluid:  >1.6 L  EDUCATION NEEDS:   No education needs identified at this time  Koleen Distance, RD, LDN Pager #- [phone]

## 2016-08-22 NOTE — Progress Notes (Signed)
Transporter here Jacklynn Lewis) to take patient to CT/Ultrasound. Patient traveling by wheelchair. IV saline locked. Patient in no pain currently.

## 2016-08-22 NOTE — Procedures (Signed)
Ultrasound-guided diagnostic and therapeutic left thoracentesis performed yielding 1 liter of yellow  fluid. No immediate complications. Follow-up chest x-ray pending. Fluid was submitted to the lab for preordered studies.

## 2016-08-23 ENCOUNTER — Ambulatory Visit (HOSPITAL_COMMUNITY): Payer: No Typology Code available for payment source

## 2016-08-23 LAB — CBC WITH DIFFERENTIAL/PLATELET
Basophils Absolute: 0.2 10*3/uL — ABNORMAL HIGH (ref 0.0–0.1)
Basophils Relative: 2 %
EOS ABS: 0.1 10*3/uL (ref 0.0–0.7)
Eosinophils Relative: 1 %
HEMATOCRIT: 25.9 % — AB (ref 39.0–52.0)
Hemoglobin: 8.7 g/dL — ABNORMAL LOW (ref 13.0–17.0)
LYMPHS ABS: 1.7 10*3/uL (ref 0.7–4.0)
Lymphocytes Relative: 17 %
MCH: 29.2 pg (ref 26.0–34.0)
MCHC: 33.6 g/dL (ref 30.0–36.0)
MCV: 86.9 fL (ref 78.0–100.0)
MONO ABS: 0.8 10*3/uL (ref 0.1–1.0)
Monocytes Relative: 8 %
NEUTROS PCT: 72 %
Neutro Abs: 7.1 10*3/uL (ref 1.7–7.7)
PLATELETS: 106 10*3/uL — AB (ref 150–400)
RBC: 2.98 MIL/uL — AB (ref 4.22–5.81)
RDW: 21.8 % — AB (ref 11.5–15.5)
WBC: 9.9 10*3/uL (ref 4.0–10.5)

## 2016-08-23 LAB — BASIC METABOLIC PANEL
Anion gap: 9 (ref 5–15)
BUN: 27 mg/dL — ABNORMAL HIGH (ref 6–20)
CALCIUM: 8.5 mg/dL — AB (ref 8.9–10.3)
CO2: 28 mmol/L (ref 22–32)
CREATININE: 0.53 mg/dL — AB (ref 0.61–1.24)
Chloride: 98 mmol/L — ABNORMAL LOW (ref 101–111)
GLUCOSE: 168 mg/dL — AB (ref 65–99)
Potassium: 4.3 mmol/L (ref 3.5–5.1)
Sodium: 135 mmol/L (ref 135–145)

## 2016-08-23 LAB — GLUCOSE, CAPILLARY
GLUCOSE-CAPILLARY: 109 mg/dL — AB (ref 65–99)
Glucose-Capillary: 134 mg/dL — ABNORMAL HIGH (ref 65–99)
Glucose-Capillary: 182 mg/dL — ABNORMAL HIGH (ref 65–99)
Glucose-Capillary: 189 mg/dL — ABNORMAL HIGH (ref 65–99)

## 2016-08-23 LAB — GRAM STAIN

## 2016-08-23 MED ORDER — PANCRELIPASE (LIP-PROT-AMYL) 12000-38000 UNITS PO CPEP
24000.0000 [IU] | ORAL_CAPSULE | Freq: Once | ORAL | Status: AC
  Administered 2016-08-23: 13:00:00 24000 [IU] via ORAL
  Filled 2016-08-23: qty 2

## 2016-08-23 MED ORDER — SODIUM BICARBONATE 650 MG PO TABS
650.0000 mg | ORAL_TABLET | Freq: Once | ORAL | Status: AC
  Administered 2016-08-23: 13:00:00 650 mg via ORAL
  Filled 2016-08-23: qty 1

## 2016-08-23 MED ORDER — LEVOFLOXACIN 750 MG PO TABS: 750.0000 mg | ORAL_TABLET | Freq: Every day | ORAL | 0 refills | Status: DC

## 2016-08-23 MED ORDER — LEVOFLOXACIN 750 MG PO TABS: 750.0000 mg | ORAL_TABLET | Freq: Every day | ORAL | 0 refills | Status: AC

## 2016-08-23 NOTE — Discharge Summary (Signed)
Physician Discharge Summary  JEYDEN KONOPA [id number] DOB: 77-04-1941 DOA: 08/16/2016  PCP: [name], MD  Admit date: 08/16/2016 Discharge date: 08/23/2016  Admitted From: Home Disposition:  Home  Recommendations for Outpatient Follow-up:  1. Follow up with PCP in 1-2 weeks 2. Follow up with Dr. Burr Medico, oncology next week  3. Please obtain CBC, CMP in one week  4. Please follow up on the following pending results: pleural fluid cytology and culture results   Home Health: No  Equipment/Devices: None   Discharge Condition: Stable CODE STATUS: Full  Diet recommendation: Regular + TF   Brief/Interim Summary: Fred [name] is a 76 y.o.malewithmalewith medical history significant of esophageal cancer, status post chemo and radiation therapy, status post esophagectomy, cervical esophagogastrostomy and pyloroplasty, and J-tube placement; also with GERD, hyperlipidemia, hypertension who presented to the counseling center with complaints of weakness, low-grade fevers, chills and emesis. He was admitted for anemia with GI consult as well as hematology consult.   He was diagnosed with DIC, unclear etiology. He was given blood products. He underwent echocardiogram which was negative for endocarditis, CTA chest/abd which was negative for aneurysm or dissection as work up for DIC. He was on prednisone for some time, but stopped as it was not helping much for DIC.   Due to fevers, he was started on broad spectrum antibiotics which was deescalated to levaquin. He also underwent left thoracentesis with 1L yellow fluid removed. Fluid culture/cytology is pending at time of discharge. Hgb and platelets are stable, although low. He must follow up with oncology closely with repeat blood work.   Subjective on day of discharge: Feeling well today. Has been afebrile, no chest pain or shortness of breath. Denies nausea, vomiting. Tolerating tube feedings and PO as well. Discussed with patient and wife at bedside  and all questions were answered.   Discharge Diagnoses:  Principal Problem:   Fever Active Problems:   HTN (hypertension)   Malnutrition (HCC)   Esophageal cancer (HCC)   Hyperlipidemia   Symptomatic anemia   Thrombocytopenia (HCC)   Acquired hemolytic anemia (HCC)   Pleural effusion   Heme positive stool   DIC (disseminated intravascular coagulation) (HCC)  DIC, hemolytic anemia and thrombocytopenia  -Unclear etiology  -Received 2 units of packed RBCs on 1/25 and 2 more units on 1/28 -Coombs test was negative. But with elevated LDH, low haptoglobin, elevated bilirubin, low fibrinogen and some schistocytes. With high concerns for hemolytic anemia. -GI consulted for potential EGD; but holding on that for now as patient without clinically relevant GI bleed.  -Oncology following. Stop prednisone.  -CTA chest/abd obtained, negative for aneurysm or dissection  -Hgb and platelets now stable  -Will need to follow with oncology as outpatient   Fever -Unclear etiology, but with concerns for PNA. Broad spectrum antibiotics were stopped and patient started on levaquin, would continue for total 10 days last dose 2/3  -2D echo ordered and no major abnormalities suggesting endocarditis -Left thoracentesis 1/31, fluid culture and cytology pending. Fluid LDH 192, consistent with exudative effusion (>2/3 ULN) -Now afebrile   HTN  -Continue atenolol 25 mg daily and 12.5 mg by mouth at bedtime. -BP is well controlled and stable  Moderate malnutrition in context of chronic illness -Continue Osmolite feedings through J-tube  Esophageal cancer  -Continue follow-up as scheduled by oncology and CVTS as an outpatient.  Hyperlipidemia -continue Holding simvastatin due to abnormal LFTs on admission.  -LFT's improving/trending down and with no abnormalities seen on biliary tract/gallbladder US  Discharge Instructions  Discharge Instructions    Call MD for:  extreme fatigue    Complete  by:  As directed    Call MD for:  persistant dizziness or light-headedness    Complete by:  As directed    Call MD for:  temperature >100.4    Complete by:  As directed    Diet - low sodium heart healthy    Complete by:  As directed    Increase activity slowly    Complete by:  As directed      Allergies as of 08/23/2016      Reactions   No Known Allergies       Medication List    STOP taking these medications   oxyCODONE 5 MG/5ML solution Commonly known as:  ROXICODONE   simvastatin 20 MG tablet Commonly known as:  ZOCOR     TAKE these medications   acetaminophen 325 MG tablet Commonly known as:  TYLENOL Take 650 mg by mouth every 6 (six) hours as needed.   atenolol 25 MG tablet Commonly known as:  TENORMIN Take 1 tab in the morning and half tab in the evening What changed:  how much to take  how to take this  when to take this  additional instructions   cyanocobalamin 1000 MCG tablet Take 1 tablet (1,000 mcg total) by mouth daily.   feeding supplement (OSMOLITE 1.5 CAL) Liqd Increase Osmolite 1.5 to 75 mL/hr for 16 hours daily with 140 mL water before and after continuous feeding. Flush or drink additional 600 mL free water daily. Send formula and bags.   folic acid 1 MG tablet Commonly known as:  FOLVITE Take 1 tablet (1 mg total) by mouth daily.   levofloxacin 750 MG tablet Commonly known as:  LEVAQUIN Take 1 tablet (750 mg total) by mouth daily. Start taking on:  08/24/2016   mirtazapine 15 MG tablet Commonly known as:  REMERON Take 1 tablet (15 mg total) by mouth at bedtime.   ondansetron 8 MG disintegrating tablet Commonly known as:  ZOFRAN ODT Take 1 tablet (8 mg total) by mouth every 8 (eight) hours as needed for nausea or vomiting.      Follow-up Information    WYMER,ANTOINETTE, MD. Schedule an appointment as soon as possible for a visit in 1 week(s).   Specialty:  Internal Medicine Contact information: [place] [address] [id number]        Truitt Merle, MD. Schedule an appointment as soon as possible for a visit in 1 week(s).   Specialties:  Hematology, Oncology Contact information: [address] [place] [address] [phone]          Allergies  Allergen Reactions  . No Known Allergies     Consultations:  GI  Oncology    Procedures/Studies: Dg Chest 1 View  Result Date: 08/22/2016 CLINICAL DATA:  Post left thoracentesis EXAM: CHEST 1 VIEW COMPARISON:  08/16/2016 FINDINGS: Decreasing left effusion following thoracentesis. Small left effusion remains. No pneumothorax. Left base atelectasis. Right lung is clear. Heart is normal size. No acute bony abnormality. IMPRESSION: Small left pleural effusion, decreased following thoracentesis. No pneumothorax. Left base atelectasis. Electronically Signed   By: Rolm Baptise M.D.   On: 08/22/2016 11:52   Dg Chest 1 View  Result Date: 08/02/2016 CLINICAL DATA:  Cough. EXAM: CHEST 1 VIEW COMPARISON:  07/26/2016 . FINDINGS: Mediastinum hilar structures are normal. Left lower lobe subsegmental atelectasis and or infiltrate. Small left pleural effusion.  No pneumothorax. Bony lesions involving the left posterior sixth seventh and eighth ribs again cannot be excluded. These are better demonstrated on prior study. Catheter noted over the left upper abdomen. IMPRESSION: Left lower lobe subsegmental atelectasis and or infiltrate. Small left pleural effusion. Findings have improved slightly from prior study of 07/26/2016. Electronically Signed   By: Marcello Moores  Register   On: 08/02/2016 10:20   Dg Chest 2 View  Result Date: 07/26/2016 CLINICAL DATA:  Esophageal carcinoma EXAM: CHEST  2 VIEW COMPARISON:  July 04, 2016 FINDINGS: There is a moderate pleural effusion on the left with consolidation left lower lobe and inferior lingula. Right lung is clear. Heart size and pulmonary vascularity are normal. No adenopathy. Air is noted throughout much of the  esophagus. There is cortical irregularity in portions of the posterior left sixth, seventh, and eighth ribs, concerning for potential metastatic foci in these areas. IMPRESSION: Moderate left pleural effusion with left base and inferior lingular consolidation. Right lung clear. Stable cardiac silhouette. Note that there is extensive air throughout much of the esophagus. Concern for bony metastases in the left posterior sixth, seventh, and eighth ribs. Electronically Signed   By: Lowella Grip III M.D.   On: 07/26/2016 09:44   US Abdomen Complete  Result Date: 08/17/2016 CLINICAL DATA:  Abnormal liver function. EXAM: ABDOMEN ULTRASOUND COMPLETE COMPARISON:  CT 08/14/2016 FINDINGS: Gallbladder: No gallstones or wall thickening visualized. No sonographic Murphy sign noted by sonographer. Common bile duct: Diameter: Normal caliber, 5 mm Liver: No focal lesion identified. Within normal limits in parenchymal echogenicity. IVC: No abnormality visualized. Pancreas: Visualized portion unremarkable. Spleen: Size and appearance within normal limits. Right Kidney: Length: 11.6 cm. Multiple cysts, the largest 6.1 cm. No hydronephrosis. Normal echotexture. Left Kidney: Length: 10.6 cm. Multiple cysts, the largest 4.7 cm. No hydronephrosis. Normal echotexture. Abdominal aorta: No aneurysm visualized. Other findings: None. IMPRESSION: No acute findings. Bilateral renal cysts. Electronically Signed   By: Rolm Baptise M.D.   On: 08/17/2016 08:22   Nm Pet Image Restag (ps) Skull Base To Thigh  Result Date: 08/14/2016 CLINICAL DATA:  Subsequent treatment strategy for lower esophageal malignancy. EXAM: NUCLEAR MEDICINE PET SKULL BASE TO THIGH TECHNIQUE: 6.6 mCi F-18 FDG was injected intravenously. Full-ring PET imaging was performed from the skull base to thigh after the radiotracer. CT data was obtained and used for attenuation correction and anatomic localization. FASTING BLOOD GLUCOSE:  Value: 206 mg/dl COMPARISON:   12/05/2015 FINDINGS: NECK No hypermetabolic lymph nodes in the neck. CHEST Esophagectomy with gastric pull-through. Moderate to large left pleural effusion with passive atelectasis. No hypermetabolic activity to suggest recurrent or metastatic disease in the chest at this time. ABDOMEN/PELVIS Physiologic activity in the bowel. No metastatic lesion in the liver is identified. A left adrenal mass previously measured 1.4 by 1.7 cm on 12/01/2015 and currently measures 1.4 by 1.6 cm on image 109/4. Internal density 15 Hounsfield units, appearance not specific for adenoma on prior MRI from 04/10/2016. This small mass has hypermetabolic activity, maximum SUV 6.2, previously 4.2 back on 12/05/2015. Bilateral photopenic renal cysts. Left-sided percutaneous jejunostomy. Enlarged prostate gland. There is evidence of bilateral inguinal hernia repairs in the past. No hypermetabolic liver lesions. SKELETON No focal hypermetabolic activity to suggest skeletal metastasis. IMPRESSION: 1. Small hypermetabolic left adrenal mass, this has not changed in size compared to 12/01/15 but has a maximum standard uptake value of 6.2 (previously 4.2). Indeterminate density and MRI characteristics, not specific for adenoma. Early metastatic disease is not readily excluded  although the lack of change of size is somewhat reassuring. Surveillance recommended. 2. No other hypermetabolic lesions are identified. 3. Moderate to large left pleural effusion with passive atelectasis. 4. Esophagectomy with gastric pull-through. 5. No hypermetabolic liver lesions are seen. 6. Enlarged prostate gland. Electronically Signed   By: Van Clines M.D.   On: 08/14/2016 09:46   Dg Chest Port 1 View  Result Date: 08/16/2016 CLINICAL DATA:  76 y/o  M; fever and history of esophageal cancer. EXAM: PORTABLE CHEST 1 VIEW COMPARISON:  08/02/2016 chest radiograph and 08/14/2016 PET-CT. FINDINGS: The heart size and mediastinal contours are within normal limits and  stable. Moderate left pleural effusion is increased from prior radiograph with probably stable from prior PET-CT with redistribution to the lung base. Associated left basilar opacity. Otherwise no focal consolidation identified. The visualized skeletal structures are unremarkable. IMPRESSION: Moderate left pleural effusion increased from prior chest radiograph, probably stable from prior chest PET-CT. She fusion of the lung base. Associated left basilar opacity probably represents atelectasis, underline pneumonia is not excluded. Electronically Signed   By: Kristine Garbe M.D.   On: 08/16/2016 19:14   Ct Angio Chest/abd/pel For Dissection W And/or W/wo  Result Date: 08/22/2016 CLINICAL DATA:  History of esophageal cancer. Pleural effusion. Rule out aneurysm or dissection. EXAM: CT ANGIOGRAPHY CHEST, ABDOMEN AND PELVIS TECHNIQUE: Multidetector CT imaging through the chest, abdomen and pelvis was performed using the standard protocol during bolus administration of intravenous contrast. Multiplanar reconstructed images and MIPs were obtained and reviewed to evaluate the vascular anatomy. CONTRAST:  100 cc Isovue 370 IV COMPARISON:  PET CT 08/14/2016 FINDINGS: CTA CHEST FINDINGS Cardiovascular: Aorta is normal caliber. No dissection. Heart is borderline in size. No filling defects in the pulmonary arteries to suggest pulmonary emboli. Mediastinum/Nodes: Changes of prior esophagectomy with gastric pull-through, stable appearance. No mediastinal, hilar, or axillary adenopathy. Lungs/Pleura: Large left pleural effusion with compressive atelectasis in the left lower lobe, unchanged. Right lung is clear. Trace right pleural effusion. Musculoskeletal: No acute bony abnormality. Review of the MIP images confirms the above findings. CTA ABDOMEN AND PELVIS FINDINGS VASCULAR Aorta: Normal caliber.  No dissection. Celiac: Widely patent SMA: Widely patent Renals: Single bilaterally, widely patent IMA: Widely patent  Inflow: Widely patent Veins: Grossly unremarkable. Review of the MIP images confirms the above findings. NON-VASCULAR Hepatobiliary: No focal hepatic abnormality. Gallbladder unremarkable. Pancreas: Mildly atrophic pancreas. No focal abnormality or ductal dilatation. Spleen: No focal abnormality.  Normal size. Adrenals/Urinary Tract: Small nodule again noted in the left adrenal gland, shown to be hypermetabolic on prior PET CT. This measures approximately 17 mm, unchanged. Right adrenal gland is unremarkable. Bilateral renal cysts. No hydronephrosis. Urinary bladder grossly unremarkable. Stomach/Bowel: Scattered sigmoid diverticulosis. No active diverticulitis. Stomach and small bowel decompressed. Jejunostomy feeding tube in place within the left small bowel loops. Lymphatic: No adenopathy. Reproductive: Prostate enlargement with central calcifications. Other: No free fluid or free air. Musculoskeletal: No acute bony abnormality. Review of the MIP images confirms the above findings. IMPRESSION: No evidence of aortic aneurysm or dissection. No evidence of pulmonary embolus. Heart is borderline in size. Stable small left adrenal nodule which was hypermetabolic on prior PET CT. Stable large left pleural effusion with compressive atelectasis in the left lower lobe. Stable appearance of the gastric pull-through post esophagectomy. Electronically Signed   By: Rolm Baptise M.D.   On: 08/22/2016 12:30   US Thoracentesis Asp Pleural Space W/img Guide  Result Date: 08/22/2016 INDICATION: Esophageal cancer, recurrent left pleural effusion.  Request made for diagnostic and therapeutic left thoracentesis. EXAM: ULTRASOUND GUIDED DIAGNOSTIC AND THERAPEUTIC LEFT THORACENTESIS MEDICATIONS: None. COMPLICATIONS: None immediate. PROCEDURE: An ultrasound guided thoracentesis was thoroughly discussed with the patient and questions answered. The benefits, risks, alternatives and complications were also discussed. The patient  understands and wishes to proceed with the procedure. Written consent was obtained. Ultrasound was performed to localize and mark an adequate pocket of fluid in the left chest. The area was then prepped and draped in the normal sterile fashion. 1% Lidocaine was used for local anesthesia. Under ultrasound guidance a Safe-T-Centesis catheter was introduced. Thoracentesis was performed. The catheter was removed and a dressing applied. FINDINGS: A total of approximately 1 liter of yellow fluid was removed. Samples were sent to the laboratory as requested by the clinical team. IMPRESSION: Successful ultrasound guided diagnostic and therapeutic left thoracentesis yielding 1 liter of pleural fluid. Read by: Rowe Robert, PA-C Electronically Signed   By: Sandi Mariscal M.D.   On: 08/22/2016 11:38   US Thoracentesis Asp Pleural Space W/img Guide  Result Date: 08/02/2016 INDICATION: Patient with history of esophageal cancer, recurrent left pleural effusion. Request made for diagnostic and therapeutic left thoracentesis. EXAM: ULTRASOUND GUIDED DIAGNOSTIC AND THERAPEUTIC LEFT THORACENTESIS MEDICATIONS: None. COMPLICATIONS: None immediate. PROCEDURE: An ultrasound guided thoracentesis was thoroughly discussed with the patient and questions answered. The benefits, risks, alternatives and complications were also discussed. The patient understands and wishes to proceed with the procedure. Written consent was obtained. Ultrasound was performed to localize and mark an adequate pocket of fluid in the left chest. The area was then prepped and draped in the normal sterile fashion. 1% Lidocaine was used for local anesthesia. Under ultrasound guidance a Safe-T-Centesis catheter was introduced. Thoracentesis was performed. The catheter was removed and a dressing applied. FINDINGS: A total of approximately 900 cc of yellow fluid was removed. Samples were sent to the laboratory as requested by the clinical team. IMPRESSION: Successful  ultrasound guided diagnostic and therapeutic left thoracentesis yielding 900 cc of pleural fluid. Read by: Rowe Robert, PA-C Electronically Signed   By: Aletta Edouard M.D.   On: 08/02/2016 10:07    Echo  Study Conclusions  - Left ventricle: The cavity size was normal. Wall thickness was   normal. Systolic function was vigorous. The estimated ejection   fraction was in the range of 65% to 70%. Wall motion was normal;   there were no regional wall motion abnormalities. - Aortic valve: There was mild to moderate regurgitation directed   centrally in the LVOT. - Left atrium: There appaers to be mild compression on the   posterior and lateral wall of the left atrium by an extracardiac   mass. - Pericardium, extracardiac: There was a left pleural effusion.   Discharge Exam: Vitals:   08/22/16 2055 08/23/16 0624  BP: 127/65 (!) 131/57  Pulse: 79 88  Resp: 16 16  Temp: 97.7 F (36.5 C) 97.8 F (36.6 C)   Vitals:   08/22/16 1214 08/22/16 1432 08/22/16 2055 08/23/16 0624  BP: (!) 114/56 115/61 127/65 (!) 131/57  Pulse: 80 78 79 88  Resp: 16 14 16 16   Temp: 98.2 F (36.8 C) 98.9 F (37.2 C) 97.7 F (36.5 C) 97.8 F (36.6 C)  TempSrc: Oral Oral Oral Oral  SpO2: 100% 100% 99% 100%  Weight:    58.8 kg (129 lb 10.1 oz)  Height:        General: Pt is alert, awake, not in acute distress Cardiovascular: RRR, S1/S2 +,  no rubs, no gallops Respiratory: CTA bilaterally, slightly diminished LLL, no wheezing, no rhonchi Abdominal: Soft, NT, ND, bowel sounds +, PEG in place  Extremities: no edema, no cyanosis    The results of significant diagnostics from this hospitalization (including imaging, microbiology, ancillary and laboratory) are listed below for reference.     Microbiology: Recent Results (from the past 240 hour(s))  Urine Culture     Status: None   Collection Time: 08/15/16 12:24 PM  Result Value Ref Range Status   Urine Culture, Routine Final report  Final   Urine  Culture result 1 Comment  Final    Comment: Culture shows less than 10,000 colony forming units of bacteria per milliliter of urine. This colony count is not generally considered to be clinically significant.   Culture, blood (single) w Reflex to ID Panel     Status: None   Collection Time: 08/15/16 12:24 PM  Result Value Ref Range Status   BLOOD CULTURE, ROUTINE Final report  Final   RESULT 1 Comment  Final    Comment: No aerobic or anaerobic growth in five days.  TECHNOLOGIST REVIEW     Status: None   Collection Time: 08/15/16 12:30 PM  Result Value Ref Range Status   Technologist Review Metas and Myelocytes present. 9% nrbc. sl poly  Final  TECHNOLOGIST REVIEW     Status: None   Collection Time: 08/16/16 12:06 PM  Result Value Ref Range Status   Technologist Review Metas and Myelocytes present  Final  Culture, body fluid-bottle     Status: None (Preliminary result)   Collection Time: 08/22/16 11:22 AM  Result Value Ref Range Status   Specimen Description PLEURAL LEFT  Final   Special Requests NONE  Final   Culture   Final    NO GROWTH <12 HOURS Performed at [facility], [address] [place], [place] [address]    Report Status PENDING  Incomplete  Gram stain     Status: None (Preliminary result)   Collection Time: 08/22/16 11:22 AM  Result Value Ref Range Status   Specimen Description FLUID LEFT PLEURAL  Final   Special Requests NONE  Final   Gram Stain   Final    RARE WBC PRESENT, PREDOMINANTLY MONONUCLEAR NO ORGANISMS SEEN Performed at [facility], [place] [address] [place], [place] [address]    Report Status PENDING  Incomplete     Labs: BNP (last 3 results)  Recent Labs  05/03/16 0256  BNP [id number]   Basic Metabolic Panel:  Recent Labs Lab 08/16/16 1206 08/17/16 0424 08/18/16 0503 08/19/16 0453 08/22/16 0420 08/23/16 0439  NA 136 134* 132*  --  135 135  K 4.9 4.2 4.0  --  4.2 4.3  CL  --  102 100*  --  99* 98*  CO2 25 25 25   --  26 28   GLUCOSE 171* 223* 188*  --  205* 168*  BUN 29.4* 28* 19  --  25* 27*  CREATININE 0.7 0.53* 0.46* 0.48* 0.59* 0.53*  CALCIUM 9.3 8.4* 8.1*  --  8.6* 8.5*   Liver Function Tests:  Recent Labs Lab 08/16/16 1206 08/17/16 0424 08/18/16 0503 08/22/16 0420  AST 129* 103* 84* 50*  ALT 55 52 62 48  ALKPHOS 432* 346* 326* 299*  BILITOT 1.8*  2.16* 1.7* 1.4* 1.5*  PROT 6.7 5.7* 5.3* 5.4*  ALBUMIN 3.6 3.4* 2.9* 3.1*   No results for input(s): LIPASE, AMYLASE in the last 168 hours.  No results for input(s): AMMONIA in the last 168 hours. CBC:  Recent Labs Lab 08/16/16 1206  08/16/16 1854 08/17/16 0424  08/18/16 0503 08/19/16 0453 08/20/16 0422 08/21/16 0425 08/22/16 0420 08/23/16 0439  WBC 7.9  < > 6.6 6.4  < > 4.6 6.7 6.6 8.3 9.4 9.9  NEUTROABS 5.9  --  4.5 4.4  --  2.9  --   --   --   --  7.1  HGB 6.5*  < > 8.9* 8.8*  < > 7.5* 10.8* 9.1* 9.3* 8.9* 8.7*  HCT 18.9*  < > 25.7* 24.9*  < > 21.1* 30.6* 26.0* 26.6* 25.3* 25.9*  MCV 89.2  < > 86.2 87.4  < > 87.2 84.3 85.2 86.9 87.5 86.9  PLT 72*  < > 64* 69*  < > 60*  61* 84*  80* 81*  83* 91* 90* 106*  < > = values in this interval not displayed. Cardiac Enzymes: No results for input(s): CKTOTAL, CKMB, CKMBINDEX, TROPONINI in the last 168 hours. BNP: Invalid input(s): POCBNP CBG:  Recent Labs Lab 08/22/16 0628 08/22/16 1557 08/22/16 2006 08/23/16 0013 08/23/16 0414  GLUCAP 195* 134* 109* 182* 189*   D-Dimer No results for input(s): DDIMER in the last 72 hours. Hgb A1c No results for input(s): HGBA1C in the last 72 hours. Lipid Profile No results for input(s): CHOL, HDL, LDLCALC, TRIG, CHOLHDL, LDLDIRECT in the last 72 hours. Thyroid function studies No results for input(s): TSH, T4TOTAL, T3FREE, THYROIDAB in the last 72 hours.  Invalid input(s): FREET3 Anemia work up  Recent Labs  08/21/16 0425  RETICCTPCT 10.2*   Urinalysis    Component Value Date/Time   COLORURINE AMBER (A) 08/16/2016 0429    APPEARANCEUR CLEAR 08/16/2016 0429   LABSPEC 1.025 08/16/2016 0429   LABSPEC 1.010 08/15/2016 1224   PHURINE 5.0 08/16/2016 0429   GLUCOSEU NEGATIVE 08/16/2016 0429   GLUCOSEU Negative 08/15/2016 1224   HGBUR NEGATIVE 08/16/2016 0429   BILIRUBINUR NEGATIVE 08/16/2016 0429   BILIRUBINUR Negative 08/15/2016 [place] 08/16/2016 0429   PROTEINUR 30 (A) 08/16/2016 0429   UROBILINOGEN 0.2 08/15/2016 1224   NITRITE NEGATIVE 08/16/2016 0429   LEUKOCYTESUR NEGATIVE 08/16/2016 0429   LEUKOCYTESUR Negative 08/15/2016 1224   Sepsis Labs Invalid input(s): PROCALCITONIN,  WBC,  LACTICIDVEN Microbiology Recent Results (from the past 240 hour(s))  Urine Culture     Status: None   Collection Time: 08/15/16 12:24 PM  Result Value Ref Range Status   Urine Culture, Routine Final report  Final   Urine Culture result 1 Comment  Final    Comment: Culture shows less than 10,000 colony forming units of bacteria per milliliter of urine. This colony count is not generally considered to be clinically significant.   Culture, blood (single) w Reflex to ID Panel     Status: None   Collection Time: 08/15/16 12:24 PM  Result Value Ref Range Status   BLOOD CULTURE, ROUTINE Final report  Final   RESULT 1 Comment  Final    Comment: No aerobic or anaerobic growth in five days.  TECHNOLOGIST REVIEW     Status: None   Collection Time: 08/15/16 12:30 PM  Result Value Ref Range Status   Technologist Review Metas and Myelocytes present. 9% nrbc. sl poly  Final  TECHNOLOGIST REVIEW     Status: None   Collection Time: 08/16/16 12:06 PM  Result Value Ref Range Status   Technologist Review Metas and Myelocytes present  Final  Culture, body  fluid-bottle     Status: None (Preliminary result)   Collection Time: 08/22/16 11:22 AM  Result Value Ref Range Status   Specimen Description PLEURAL LEFT  Final   Special Requests NONE  Final   Culture   Final    NO GROWTH <12 HOURS Performed at [facility], [address] [place], [place] [address]    Report Status PENDING  Incomplete  Gram stain     Status: None (Preliminary result)   Collection Time: 08/22/16 11:22 AM  Result Value Ref Range Status   Specimen Description FLUID LEFT PLEURAL  Final   Special Requests NONE  Final   Gram Stain   Final    RARE WBC PRESENT, PREDOMINANTLY MONONUCLEAR NO ORGANISMS SEEN Performed at [facility], [place] [address] [place], [place] [address]    Report Status PENDING  Incomplete     Time coordinating discharge: Over 30 minutes  SIGNED:  Dessa Phi, DO [facility] Pager [phone]  If 7PM-7AM, please contact night-coverage [email or web address] Password [facility] 08/23/2016, 10:37 AM

## 2016-08-23 NOTE — Progress Notes (Signed)
Fred [name]   DOB:03-Jun-1941   [id number]   [id number]  Hematology and Oncology follow up  Subjective: Patient underwent CT scan and a left thoracentesis today, tolerated the procedure well. No other new complaints. No overt bleeding, H/H slowly trending down   Objective:  Vitals:   08/22/16 2055 08/23/16 0624  BP: 127/65 (!) 131/57  Pulse: 79 88  Resp: 16 16  Temp: 97.7 F (36.5 C) 97.8 F (36.6 C)    Body mass index is 21.57 kg/m.  Intake/Output Summary (Last 24 hours) at 08/23/16 0842 Last data filed at 08/23/16 [id number]  Gross per 24 hour  Intake          2426.25 ml  Output              550 ml  Net          1876.25 ml     Sclerae unicteric  Oropharynx clear  No peripheral adenopathy  Lungs clear -- no rales or rhonchi  Heart regular rate and rhythm  Abdomen benign  MSK no focal spinal tenderness, no peripheral edema  Neuro nonfocal   CBG (last 3)   Recent Labs  08/21/16 2115 08/21/16 2357 08/22/16 0628  GLUCAP 226* 235* 195*     Labs:  Lab Results  Component Value Date   WBC 9.9 08/23/2016   HGB 8.7 (L) 08/23/2016   HCT 25.9 (L) 08/23/2016   MCV 86.9 08/23/2016   PLT 106 (L) 08/23/2016   NEUTROABS 7.1 08/23/2016   CMP Latest Ref Rng & Units 08/23/2016 08/22/2016 08/19/2016  Glucose 65 - 99 mg/dL 168(H) 205(H) -  BUN 6 - 20 mg/dL 27(H) 25(H) -  Creatinine 0.61 - 1.24 mg/dL 0.53(L) 0.59(L) 0.48(L)  Sodium 135 - 145 mmol/L 135 135 -  Potassium 3.5 - 5.1 mmol/L 4.3 4.2 -  Chloride 101 - 111 mmol/L 98(L) 99(L) -  CO2 22 - 32 mmol/L 28 26 -  Calcium 8.9 - 10.3 mg/dL 8.5(L) 8.6(L) -  Total Protein 6.5 - 8.1 g/dL - 5.4(L) -  Total Bilirubin 0.3 - 1.2 mg/dL - 1.5(H) -  Alkaline Phos 38 - 126 U/L - 299(H) -  AST 15 - 41 U/L - 50(H) -  ALT 17 - 63 U/L - 48 -    Urine Studies No results for input(s): UHGB, CRYS in the last 72 hours.  Invalid input(s): UACOL, UAPR, USPG, UPH, UTP, UGL, UKET, UBIL, UNIT, UROB, [place], UEPI, UWBC, [place], [place], [place], [place],  [place]   Basic Metabolic Panel:  Recent Labs Lab 08/16/16 1206  08/17/16 0424 08/18/16 0503 08/19/16 0453 08/22/16 0420 08/23/16 0439  NA 136  --  134* 132*  --  135 135  K 4.9  < > 4.2 4.0  --  4.2 4.3  CL  --   --  102 100*  --  99* 98*  CO2 25  --  25 25  --  26 28  GLUCOSE 171*  --  223* 188*  --  205* 168*  BUN 29.4*  --  28* 19  --  25* 27*  CREATININE 0.7  --  0.53* 0.46* 0.48* 0.59* 0.53*  CALCIUM 9.3  --  8.4* 8.1*  --  8.6* 8.5*  < > = values in this interval not displayed. GFR Estimated Creatinine Clearance: 65.3 mL/min (by C-G formula based on SCr of 0.53 mg/dL (L)). Liver Function Tests:  Recent Labs Lab 08/16/16 1206 08/17/16 0424 08/18/16 0503 08/22/16 0420  AST 129* 103* 84* 50*  ALT 55 52 62 48  ALKPHOS 432* 346* 326* 299*  BILITOT 1.8*  2.16* 1.7* 1.4* 1.5*  PROT 6.7 5.7* 5.3* 5.4*  ALBUMIN 3.6 3.4* 2.9* 3.1*   No results for input(s): LIPASE, AMYLASE in the last 168 hours. No results for input(s): AMMONIA in the last 168 hours. Coagulation profile  Recent Labs Lab 08/16/16 1206 08/17/16 0424 08/18/16 0503 08/19/16 0453 08/20/16 0422  INR 1.2 1.42 1.44 1.29 1.52    CBC:  Recent Labs Lab 08/16/16 1206  08/16/16 1854 08/17/16 0424  08/18/16 0503 08/19/16 0453 08/20/16 0422 08/21/16 0425 08/22/16 0420 08/23/16 0439  WBC 7.9  < > 6.6 6.4  < > 4.6 6.7 6.6 8.3 9.4 9.9  NEUTROABS 5.9  --  4.5 4.4  --  2.9  --   --   --   --  7.1  HGB 6.5*  < > 8.9* 8.8*  < > 7.5* 10.8* 9.1* 9.3* 8.9* 8.7*  HCT 18.9*  < > 25.7* 24.9*  < > 21.1* 30.6* 26.0* 26.6* 25.3* 25.9*  MCV 89.2  < > 86.2 87.4  < > 87.2 84.3 85.2 86.9 87.5 86.9  PLT 72*  < > 64* 69*  < > 60*  61* 84*  80* 81*  83* 91* 90* 106*  < > = values in this interval not displayed. Cardiac Enzymes: No results for input(s): CKTOTAL, CKMB, CKMBINDEX, TROPONINI in the last 168 hours. BNP: Invalid input(s): POCBNP CBG:  Recent Labs Lab 08/21/16 1155 08/21/16 1603 08/21/16 2115  08/21/16 2357 08/22/16 0628  GLUCAP 171* 207* 226* 235* 195*   D-Dimer No results for input(s): DDIMER in the last 72 hours. Hgb A1c No results for input(s): HGBA1C in the last 72 hours. Lipid Profile No results for input(s): CHOL, HDL, LDLCALC, TRIG, CHOLHDL, LDLDIRECT in the last 72 hours. Thyroid function studies No results for input(s): TSH, T4TOTAL, T3FREE, THYROIDAB in the last 72 hours.  Invalid input(s): FREET3 Anemia work up  Recent Labs  08/21/16 0425  RETICCTPCT 10.2*   Microbiology Recent Results (from the past 240 hour(s))  Urine Culture     Status: None   Collection Time: 08/15/16 12:24 PM  Result Value Ref Range Status   Urine Culture, Routine Final report  Final   Urine Culture result 1 Comment  Final    Comment: Culture shows less than 10,000 colony forming units of bacteria per milliliter of urine. This colony count is not generally considered to be clinically significant.   Culture, blood (single) w Reflex to ID Panel     Status: None   Collection Time: 08/15/16 12:24 PM  Result Value Ref Range Status   BLOOD CULTURE, ROUTINE Final report  Final   RESULT 1 Comment  Final    Comment: No aerobic or anaerobic growth in five days.  TECHNOLOGIST REVIEW     Status: None   Collection Time: 08/15/16 12:30 PM  Result Value Ref Range Status   Technologist Review Metas and Myelocytes present. 9% nrbc. sl poly  Final  TECHNOLOGIST REVIEW     Status: None   Collection Time: 08/16/16 12:06 PM  Result Value Ref Range Status   Technologist Review Metas and Myelocytes present  Final  Culture, body fluid-bottle     Status: None (Preliminary result)   Collection Time: 08/22/16 11:22 AM  Result Value Ref Range Status   Specimen Description PLEURAL LEFT  Final   Special Requests NONE  Final   Culture   Final  NO GROWTH <12 HOURS Performed at [place] [address] [place], [place] [address]    Report Status PENDING  Incomplete  Gram stain      Status: None (Preliminary result)   Collection Time: 08/22/16 11:22 AM  Result Value Ref Range Status   Specimen Description FLUID LEFT PLEURAL  Final   Special Requests NONE  Final   Gram Stain   Final    RARE WBC PRESENT, PREDOMINANTLY MONONUCLEAR NO ORGANISMS SEEN Performed at [facility], [place] [address], [place], [place] [address]    Report Status PENDING  Incomplete      Studies:  Dg Chest 1 View  Result Date: 08/22/2016 CLINICAL DATA:  Post left thoracentesis EXAM: CHEST 1 VIEW COMPARISON:  08/16/2016 FINDINGS: Decreasing left effusion following thoracentesis. Small left effusion remains. No pneumothorax. Left base atelectasis. Right lung is clear. Heart is normal size. No acute bony abnormality. IMPRESSION: Small left pleural effusion, decreased following thoracentesis. No pneumothorax. Left base atelectasis. Electronically Signed   By: Rolm Baptise M.D.   On: 08/22/2016 11:52   Ct Angio Chest/abd/pel For Dissection W And/or W/wo  Result Date: 08/22/2016 CLINICAL DATA:  History of esophageal cancer. Pleural effusion. Rule out aneurysm or dissection. EXAM: CT ANGIOGRAPHY CHEST, ABDOMEN AND PELVIS TECHNIQUE: Multidetector CT imaging through the chest, abdomen and pelvis was performed using the standard protocol during bolus administration of intravenous contrast. Multiplanar reconstructed images and MIPs were obtained and reviewed to evaluate the vascular anatomy. CONTRAST:  100 cc Isovue 370 IV COMPARISON:  PET CT 08/14/2016 FINDINGS: CTA CHEST FINDINGS Cardiovascular: Aorta is normal caliber. No dissection. Heart is borderline in size. No filling defects in the pulmonary arteries to suggest pulmonary emboli. Mediastinum/Nodes: Changes of prior esophagectomy with gastric pull-through, stable appearance. No mediastinal, hilar, or axillary adenopathy. Lungs/Pleura: Large left pleural effusion with compressive atelectasis in the left lower lobe, unchanged. Right lung is clear. Trace  right pleural effusion. Musculoskeletal: No acute bony abnormality. Review of the MIP images confirms the above findings. CTA ABDOMEN AND PELVIS FINDINGS VASCULAR Aorta: Normal caliber.  No dissection. Celiac: Widely patent SMA: Widely patent Renals: Single bilaterally, widely patent IMA: Widely patent Inflow: Widely patent Veins: Grossly unremarkable. Review of the MIP images confirms the above findings. NON-VASCULAR Hepatobiliary: No focal hepatic abnormality. Gallbladder unremarkable. Pancreas: Mildly atrophic pancreas. No focal abnormality or ductal dilatation. Spleen: No focal abnormality.  Normal size. Adrenals/Urinary Tract: Small nodule again noted in the left adrenal gland, shown to be hypermetabolic on prior PET CT. This measures approximately 17 mm, unchanged. Right adrenal gland is unremarkable. Bilateral renal cysts. No hydronephrosis. Urinary bladder grossly unremarkable. Stomach/Bowel: Scattered sigmoid diverticulosis. No active diverticulitis. Stomach and small bowel decompressed. Jejunostomy feeding tube in place within the left small bowel loops. Lymphatic: No adenopathy. Reproductive: Prostate enlargement with central calcifications. Other: No free fluid or free air. Musculoskeletal: No acute bony abnormality. Review of the MIP images confirms the above findings. IMPRESSION: No evidence of aortic aneurysm or dissection. No evidence of pulmonary embolus. Heart is borderline in size. Stable small left adrenal nodule which was hypermetabolic on prior PET CT. Stable large left pleural effusion with compressive atelectasis in the left lower lobe. Stable appearance of the gastric pull-through post esophagectomy. Electronically Signed   By: Rolm Baptise M.D.   On: 08/22/2016 12:30   US Thoracentesis Asp Pleural Space W/img Guide  Result Date: 08/22/2016 INDICATION: Esophageal cancer, recurrent left pleural effusion. Request made for diagnostic and therapeutic left  thoracentesis. EXAM: ULTRASOUND  GUIDED DIAGNOSTIC AND THERAPEUTIC LEFT THORACENTESIS MEDICATIONS: None. COMPLICATIONS: None immediate. PROCEDURE: An ultrasound guided thoracentesis was thoroughly discussed with the patient and questions answered. The benefits, risks, alternatives and complications were also discussed. The patient understands and wishes to proceed with the procedure. Written consent was obtained. Ultrasound was performed to localize and mark an adequate pocket of fluid in the left chest. The area was then prepped and draped in the normal sterile fashion. 1% Lidocaine was used for local anesthesia. Under ultrasound guidance a Safe-T-Centesis catheter was introduced. Thoracentesis was performed. The catheter was removed and a dressing applied. FINDINGS: A total of approximately 1 liter of yellow fluid was removed. Samples were sent to the laboratory as requested by the clinical team. IMPRESSION: Successful ultrasound guided diagnostic and therapeutic left thoracentesis yielding 1 liter of pleural fluid. Read by: Rowe Robert, PA-C Electronically Signed   By: Sandi Mariscal M.D.   On: 08/22/2016 11:38    Assessment: 76 y.o. with past medical history of distal esophageal adenocarcinoma, stage IIIa, status post concurrent chemotherapy and radiation, and esophagectomy in October 2017, presented with worsening anemia, and mild low-grade fever.  1. Hemolytic anemia and moderate thrombocytopenia, secondary to DIC 2. Low grade fever, ? pulmonary infection, on broad antibiotics  3. History of distal esophagus adenocarcinoma, status post concurrent neoadjuvant chemoirradiation, and esophagectomy, NED on PET 08/14/2016.  4. Elevated AST and bilirubin, likely secondary to hemolysis, no evidence of biliary obstruction or cholecystitis 5. Recurrent left pleural effusion 6. Hypertension 7. Malnutrition, anorexia, on tube feeds, OK for oral intake    Plan:  -His hemoglobin has been overall stable, slowly trending down some, no overt  bleeding -reviewed his CTA result, no aneurysm or aortic dissection -Left pleural effusion appears to be yellow, nonbloody -OK to discharge if his hemoglobin is stable by tomorrow morning. I'll follow him in my clinic closely, he has appointment with me on 2/6.     Truitt Merle, MD 08/23/2016

## 2016-08-24 ENCOUNTER — Telehealth: Payer: Self-pay | Admitting: Hematology

## 2016-08-24 NOTE — Telephone Encounter (Signed)
sw pt to confirm 2/5 appt at 315 pm per LOS

## 2016-08-24 NOTE — Progress Notes (Signed)
Fred [name]  [phone] [phone]  Clinic Follow up Note   Patient Care Team: [name], MD as PCP - General (Internal Medicine) Truitt Merle, MD as Consulting Physician (Hematology) Kyung Rudd, MD as Consulting Physician (Radiation Oncology) [name], MD as Consulting Physician (Cardiothoracic Surgery) Karie Mainland, RD as Dietitian (Nutrition) Minus Breeding, MD as Consulting Physician (Cardiology)   CHIEF COMPLAINTS:  Follow up anemia   Oncology History   Presented with solid dysphagia at least daily with chest pain. Involuntary weight loss of 10-12 lb over past year  Malignant neoplasm of lower third of esophagus (HCC)   Staging form: Esophagus - Adenocarcinoma, AJCC 7th Edition   - Clinical stage from 12/15/2015: Stage IIIA (T3, N1, M0) - Signed by Truitt Merle, MD on 12/24/2015   - Pathologic stage from 04/26/2016: yT0, N0, cM0, GX - Signed by [name], MD on 04/27/2016      Malignant neoplasm of lower third of esophagus ([place])   11/23/2015 Procedure    EGD per [facility](Dr. Toledo):6cm mass in lower third of esophagus      11/24/2015 Initial Diagnosis    Esophageal cancer ([place])      11/24/2015 Pathology Results    Mucinous adenocarcinoma, moderately differentiated-Her2 analysis pending      12/05/2015 Imaging    PET scan showed hypermetabolic a mass in distal esophagus, hypermetabolic activity in the left anterior prostate gland, metabolic density in left adrenal nodule, CT attenuation suggest a benign adrenal adenoma. Left apical 2 mm lung nodule.      12/15/2015 Procedure    EUS showed a uT3N1 lesion from 27cm to 35cm from the incisors       12/26/2015 - 02/02/2016 Chemotherapy    Weekly carboplatin AUC 2 and Taxol 45 mg/m, with concurrent radiation      12/26/2015 - 02/02/2016 Radiation Therapy    Neoadjuvant chemoradiation to the esophageal cancer      04/23/2016 Surgery    total  esophagectomy with cervical esophagogastrostomy, pyloroplasty, feeding jejunostomy       04/23/2016 Pathology Results    Esophagectomy showed no residual tumor, 8 lymph nodes were all negative, incidental leiomyoma 0.2 cm      08/14/2016 PET scan    IMPRESSION: 1. Small hypermetabolic left adrenal mass, this has not changed in size compared to 12/01/15 but has a maximum standard uptake value of 6.2 (previously 4.2). Indeterminate density and MRI characteristics, not specific for adenoma. Early metastatic disease is not readily excluded although the lack of change of size is somewhat reassuring. Surveillance recommended. 2. No other hypermetabolic lesions are identified. 3. Moderate to large left pleural effusion with passive atelectasis. 4. Esophagectomy with gastric pull-through. 5. No hypermetabolic liver lesions are seen. 6. Enlarged prostate gland.       HISTORY OF PRESENTING ILLNESS (12/14/2015):  Fred [name] 76 y.o. male is here because of His newly diagnosed esophageal cancer. He is accompanied by his wife and daughter to my clinic today.  He has had dysphagia for 2 months, mainly with solid food, no dysphagia with soft food or liquid. He also has mild pain in mid chest when he swallows. No nausea, abdominal pain, or other discomfort. He has lost about 15 lbs in the last year, no other symoptoms. He was seen by his primary care physician at [facility], and referred to gastroenterologist Dr. Alice Reichert. He underwent EGD on 11/23/2015, which showed a 6 cm mass in the lower third of esophagus. Biopsy  showed adenocarcinoma, moderately differentiated. He was referred to [place] to consider neoadjuvant therapy. He is scheduled to see a Psychologist, sport and exercise at [facility] later this week.  He has had some urinary frequency, underwent TURP on 11/02/2015.   CURRENT THERAPY: Surveillance  INTERIM HISTORY: Mr. Bells returns for follow up. He was hospitalized for week for severe anemia and moderate some  cytopenia. His lab showed DIC, extensive workup was negative for the etiology of DIC. He received 4 units of blood in the past week. He presents in wheelchair today. He started to feel tired again on Saturday and feels worse today. His temperature was normal today. He has not used the bathroom in about three days so he is not sure if he has blood in his stool. He took Miralax this morning. His wife states his "brain has not been working right" and has had episodes of confusion. He has also been vomiting.     MEDICAL HISTORY:  Past Medical History:  Diagnosis Date  . Esophageal cancer ([place]) 11/23/15   lower 3rd esohagus   . GERD (gastroesophageal reflux disease)   . Hyperlipidemia   . Hypertension     SURGICAL HISTORY: Past Surgical History:  Procedure Laterality Date  . CHEST TUBE INSERTION Left 04/23/2016   Procedure: CHEST TUBE INSERTION;  Surgeon: [name], MD;  Location: [place];  Service: Thoracic;  Laterality: Left;  . COMPLETE ESOPHAGECTOMY N/A 04/23/2016   Procedure: TRANSHIATIAL TOTAL ESOPHAGECTOMY COMPLETE; CERVICAL ESOPHAGOGASTROSTOMY AND PYLOROPLASTY;  Surgeon: [name], MD;  Location: [place];  Service: Thoracic;  Laterality: N/A;  . cystoscope  4/12/1   prostate  . ERCP    . INGUINAL HERNIA REPAIR    . JEJUNOSTOMY N/A 04/23/2016   Procedure: FEEDING JEJUNOSTOMY;  Surgeon: [name], MD;  Location: [place];  Service: Thoracic;  Laterality: N/A;  . LEG SURGERY Left    hole  . PROSTATE BIOPSY  10/05/15  . right shoulder surgery    . VIDEO BRONCHOSCOPY N/A 04/23/2016   Procedure: VIDEO BRONCHOSCOPY;  Surgeon: [name], MD;  Location: [facility] OR;  Service: Thoracic;  Laterality: N/A;    SOCIAL HISTORY: Social History   Social History  . Marital status: Married    Spouse name: N/A  . Number of children: 1  . Years of education: N/A   Occupational History  . Truck Geophysicist/field seismologist    Social History Main Topics  . Smoking status: Former Smoker    Packs/day:  1.00    Years: 10.00    Quit date: 07/24/1967  . Smokeless tobacco: Never Used  . Alcohol use No  . Drug use: No  . Sexual activity: Not Currently   Other Topics Concern  . Not on file   Social History Narrative  . No narrative on file    FAMILY HISTORY: Family History  Problem Relation Age of Onset  . Cancer Maternal Grandfather 31    gastric cancer   . Alzheimer's disease Mother   . Diabetes Mother   . Stroke Father 64  . Multiple sclerosis Sister     ALLERGIES:  is allergic to no known allergies.  MEDICATIONS:  Current Outpatient Prescriptions  Medication Sig Dispense Refill  . acetaminophen (TYLENOL) 325 MG tablet Take 650 mg by mouth every 6 (six) hours as needed.    Marland Kitchen atenolol (TENORMIN) 25 MG tablet Take 1 tab in the morning and half tab in the evening (Patient taking differently: Take 12.5-25 mg by mouth 2 (two)  times daily. Take 33m in the morning and 12.558min the evening) 60 tablet 1  . folic acid (FOLVITE) 1 MG tablet Take 1 tablet (1 mg total) by mouth daily. 30 tablet 0  . mirtazapine (REMERON) 15 MG tablet Take 1 tablet (15 mg total) by mouth at bedtime. 30 tablet 2  . Nutritional Supplements (FEEDING SUPPLEMENT, OSMOLITE 1.5 CAL,) LIQD Increase Osmolite 1.5 to 75 mL/hr for 16 hours daily with 140 mL water before and after continuous feeding. Flush or drink additional 600 mL free water daily. Send formula and bags. 1185 mL 0  . ondansetron (ZOFRAN ODT) 8 MG disintegrating tablet Take 1 tablet (8 mg total) by mouth every 8 (eight) hours as needed for nausea or vomiting. 30 tablet 2  . oxycodone (OXY-IR) 5 MG capsule Take 5 mg by mouth every 6 (six) hours as needed.    . vitamin B-12 1000 MCG tablet Take 1 tablet (1,000 mcg total) by mouth daily. 30 tablet 0   No current facility-administered medications for this visit.    Facility-Administered Medications Ordered in Other Visits  Medication Dose Route Frequency Provider Last Rate Last Dose  . 0.9 %  sodium  chloride infusion   Intravenous Once YaTruitt MerleMD      . heparin lock flush 100 unit/mL  500 Units Intracatheter Daily PRN YaTruitt MerleMD      . sodium chloride flush (NS) 0.9 % injection 10 mL  10 mL Intracatheter PRN YaTruitt MerleMD        REVIEW OF SYSTEMS:   Constitutional: Denies fevers, chills or abnormal night sweats, (+) very fatigued Eyes: Denies blurriness of vision, double vision or watery eyes Ears, nose, mouth, throat, and face: Denies mucositis or sore throat Respiratory: Denies cough, dyspnea or wheezes Cardiovascular: Denies palpitation, chest discomfort or lower extremity swelling Gastrointestinal:  Denies heartburn or change in bowel habits (+) constipation for three days Skin: Denies abnormal skin rashes Lymphatics: Denies new lymphadenopathy or easy bruising Neurological:Denies numbness, tingling or new weaknesses Behavioral/Psych: Mood is stable, no new changes  All other systems were reviewed with the patient and are negative.  PHYSICAL EXAMINATION: ECOG PERFORMANCE STATUS: 3 BP 103/64 (BP Location: Left Arm, Patient Position: Sitting)   Pulse (!) 136 Comment: informed Thu  Temp 98.4 F (36.9 C) (Oral)   Ht 5' 5"  (1.651 m)   Wt 132 lb 14.4 oz (60.3 kg)   SpO2 100%   BMI 22.12 kg/m   GENERAL:alert, no distress and comfortable, in wheelchair SKIN: Appears to be pale, skin texture, turgor are normal, no rashes or significant lesions EYES: normal, conjunctiva are pink and non-injected, sclera clear OROPHARYNX:no exudate, no erythema and lips, buccal mucosa, and tongue normal  NECK: supple, thyroid normal size, non-tender, without nodularity LYMPH:  no palpable lymphadenopathy in the cervical, axillary or inguinal LUNGS: clear to auscultation and percussion with normal breathing effort except decreased breath sound at the left lung base HEART: regular rate & rhythm and no murmurs and no lower extremity edema ABDOMEN:abdomen soft, non-tender and normal bowel sounds, J  tube in place. (+) Hepatomegaly, liver is palpable 2-3 cm below the rib cage, nontender. Musculoskeletal:no cyanosis of digits and no clubbing  PSYCH: alert & oriented x 3 with fluent speech NEURO: no focal motor/sensory deficits  LABORATORY DATA:  I have reviewed the data as listed CBC Latest Ref Rng & Units 08/27/2016 08/23/2016 08/22/2016  WBC 4.0 - 10.3 10e3/uL 13.1(H) 9.9 9.4  Hemoglobin 13.0 - 17.1 g/dL 7.2(L)  8.7(L) 8.9(L)  Hematocrit 38.4 - 49.9 % 21.6(L) 25.9(L) 25.3(L)  Platelets 140 - 400 10e3/uL 84(L) 106(L) 90(L)   CMP Latest Ref Rng & Units 08/27/2016 08/23/2016 08/22/2016  Glucose 70 - 140 mg/dl 190(H) 168(H) 205(H)  BUN 7.0 - 26.0 mg/dL 24.2 27(H) 25(H)  Creatinine 0.7 - 1.3 mg/dL 0.7 0.53(L) 0.59(L)  Sodium 136 - 145 mEq/L 133(L) 135 135  Potassium 3.5 - 5.1 mEq/L 4.7 4.3 4.2  Chloride 101 - 111 mmol/L - 98(L) 99(L)  CO2 22 - 29 mEq/L 23 28 26   Calcium 8.4 - 10.4 mg/dL 9.0 8.5(L) 8.6(L)  Total Protein 6.4 - 8.3 g/dL 6.1(L) - 5.4(L)  Total Bilirubin 0.20 - 1.20 mg/dL 1.61(H) - 1.5(H)  Alkaline Phos 40 - 150 U/L 520(H) - 299(H)  AST 5 - 34 U/L 105(H) - 50(H)  ALT 0 - 55 U/L 37 - 48   Results for KIRAN, CARLINE (MRN [id number]) as of 08/27/2016 10:04  Ref. Range 12/14/2015 14:55 12/14/2015 16:52 01/23/2016 08:51 08/09/2016 13:04  CEA Latest Ref Range: 0.0 - 4.7 ng/mL 28.5 (H)   2,382.0 (H)  CEA ([facility]) Latest Ref Range: 0.00 - 5.00 ng/mL    1,731.10 x 2 dilu... (H)    Pathology report Diagnosis 08/02/16 PLEURAL FLUID, LEFT (SPECIMEN 1 OF 1 COLLECTED 08/02/16): REACTIVE MESOTHELIAL CELLS PRESENT. LYMPHOCYTES PRESENT.  Diagnosis 11/23/2015 Mass, lower third of the esophagus, mucosal biopsy Mucinous adenocarcinoma, moderately differentiated.  HER 2 (-)   Diagnosis 04/23/2016 1. Lymph node, biopsy, Cervical - ONE OF ONE LYMPH NODES NEGATIVE FOR CARCINOMA (0/1). 2. Lymph node, biopsy, Periesophageal - BLOOD WITH SCANT BENIGN SOFT TISSUE AND SKELETAL MUSCLE. - NO  MALIGNANCY IDENTIFIED. 3. Esophagogastrectomy - ULCERATION WITH INFLAMMATION AND REACTIVE CHANGES. - FOCAL INTESTINAL METAPLASIA. - CHANGES CONSISTENT WITH TREATMENT EFFECT. - SEVEN OF SEVEN LYMPH NODES NEGATIVE FOR CARCINOMA (0/7). - INCIDENTAL LEIOMYOMA, 0.2 CM. - MARGINS ARE NEGATIVE FOR DYSPLASIA OR MALIGNANCY. - SEE ONCOLOGY TABLE. Microscopic Comment 3. ESOPHAGUS: Specimen: Esophagus and proximal stomach, cervical lymph node. Procedure: Esophagogastrectomy and cervical lymph node biopsy. Tumor Site: Presumed GE junction, see comment. Relationship of Tumor to esophagogastric junction: Can not be assessed. Distance of tumor center from esophagogastric junction: Can not be assessed. Tumor Size Greatest dimension: No residual tumor present. Histologic Type: Per medical record adenocarcinoma (no biopsy for review). Histologic Grade: N/A. Microscopic Tumor Extension: N/A. Margins: Negative. Treatment Effect: Present (TRS 0). Lymph-Vascular Invasion: Not identified. Perineural Invasion: Not identified. Lymph nodes: number examined 8; number positive: 0 TNM: ypT0, ypN0 see comment. 1 of 3 FINAL for FLEET, HIGHAM ([id number]) Microscopic Comment(continued) Ancillary studies: None. Comments: The entire GE junction is submitted for evaluation. There is ulceration with associated inflammation. There is acellular mucin/myxoid changes which extend through the muscularis propria, but no viable/residual tumor is identified and thus the stage is ypT0. There is focal background intestinal metaplasia, which may have been pre-existing (Barrett's esophagus) or related to therapy.   RADIOGRAPHIC STUDIES: I have personally reviewed the radiological images as listed and agreed with the findings in the report. Dg Chest 1 View  Result Date: 08/22/2016 CLINICAL DATA:  Post left thoracentesis EXAM: CHEST 1 VIEW COMPARISON:  08/16/2016 FINDINGS: Decreasing left effusion following thoracentesis.  Small left effusion remains. No pneumothorax. Left base atelectasis. Right lung is clear. Heart is normal size. No acute bony abnormality. IMPRESSION: Small left pleural effusion, decreased following thoracentesis. No pneumothorax. Left base atelectasis. Electronically Signed   By: Rolm Baptise M.D.   On: 08/22/2016  11:52   Dg Chest 1 View  Result Date: 08/02/2016 CLINICAL DATA:  Cough. EXAM: CHEST 1 VIEW COMPARISON:  07/26/2016 . FINDINGS: Mediastinum hilar structures are normal. Left lower lobe subsegmental atelectasis and or infiltrate. Small left pleural effusion. No pneumothorax. Bony lesions involving the left posterior sixth seventh and eighth ribs again cannot be excluded. These are better demonstrated on prior study. Catheter noted over the left upper abdomen. IMPRESSION: Left lower lobe subsegmental atelectasis and or infiltrate. Small left pleural effusion. Findings have improved slightly from prior study of 07/26/2016. Electronically Signed   By: Marcello Moores  Register   On: 08/02/2016 10:20   US Abdomen Complete  Result Date: 08/17/2016 CLINICAL DATA:  Abnormal liver function. EXAM: ABDOMEN ULTRASOUND COMPLETE COMPARISON:  CT 08/14/2016 FINDINGS: Gallbladder: No gallstones or wall thickening visualized. No sonographic Murphy sign noted by sonographer. Common bile duct: Diameter: Normal caliber, 5 mm Liver: No focal lesion identified. Within normal limits in parenchymal echogenicity. IVC: No abnormality visualized. Pancreas: Visualized portion unremarkable. Spleen: Size and appearance within normal limits. Right Kidney: Length: 11.6 cm. Multiple cysts, the largest 6.1 cm. No hydronephrosis. Normal echotexture. Left Kidney: Length: 10.6 cm. Multiple cysts, the largest 4.7 cm. No hydronephrosis. Normal echotexture. Abdominal aorta: No aneurysm visualized. Other findings: None. IMPRESSION: No acute findings. Bilateral renal cysts. Electronically Signed   By: Rolm Baptise M.D.   On: 08/17/2016 08:22   Nm  Pet Image Restag (ps) Skull Base To Thigh  Result Date: 08/14/2016 CLINICAL DATA:  Subsequent treatment strategy for lower esophageal malignancy. EXAM: NUCLEAR MEDICINE PET SKULL BASE TO THIGH TECHNIQUE: 6.6 mCi F-18 FDG was injected intravenously. Full-ring PET imaging was performed from the skull base to thigh after the radiotracer. CT data was obtained and used for attenuation correction and anatomic localization. FASTING BLOOD GLUCOSE:  Value: 206 mg/dl COMPARISON:  12/05/2015 FINDINGS: NECK No hypermetabolic lymph nodes in the neck. CHEST Esophagectomy with gastric pull-through. Moderate to large left pleural effusion with passive atelectasis. No hypermetabolic activity to suggest recurrent or metastatic disease in the chest at this time. ABDOMEN/PELVIS Physiologic activity in the bowel. No metastatic lesion in the liver is identified. A left adrenal mass previously measured 1.4 by 1.7 cm on 12/01/2015 and currently measures 1.4 by 1.6 cm on image 109/4. Internal density 15 Hounsfield units, appearance not specific for adenoma on prior MRI from 04/10/2016. This small mass has hypermetabolic activity, maximum SUV 6.2, previously 4.2 back on 12/05/2015. Bilateral photopenic renal cysts. Left-sided percutaneous jejunostomy. Enlarged prostate gland. There is evidence of bilateral inguinal hernia repairs in the past. No hypermetabolic liver lesions. SKELETON No focal hypermetabolic activity to suggest skeletal metastasis. IMPRESSION: 1. Small hypermetabolic left adrenal mass, this has not changed in size compared to 12/01/15 but has a maximum standard uptake value of 6.2 (previously 4.2). Indeterminate density and MRI characteristics, not specific for adenoma. Early metastatic disease is not readily excluded although the lack of change of size is somewhat reassuring. Surveillance recommended. 2. No other hypermetabolic lesions are identified. 3. Moderate to large left pleural effusion with passive atelectasis. 4.  Esophagectomy with gastric pull-through. 5. No hypermetabolic liver lesions are seen. 6. Enlarged prostate gland. Electronically Signed   By: Van Clines M.D.   On: 08/14/2016 09:46   Dg Chest Port 1 View  Result Date: 08/16/2016 CLINICAL DATA:  76 y/o  M; fever and history of esophageal cancer. EXAM: PORTABLE CHEST 1 VIEW COMPARISON:  08/02/2016 chest radiograph and 08/14/2016 PET-CT. FINDINGS: The heart size and mediastinal contours  are within normal limits and stable. Moderate left pleural effusion is increased from prior radiograph with probably stable from prior PET-CT with redistribution to the lung base. Associated left basilar opacity. Otherwise no focal consolidation identified. The visualized skeletal structures are unremarkable. IMPRESSION: Moderate left pleural effusion increased from prior chest radiograph, probably stable from prior chest PET-CT. She fusion of the lung base. Associated left basilar opacity probably represents atelectasis, underline pneumonia is not excluded. Electronically Signed   By: Kristine Garbe M.D.   On: 08/16/2016 19:14   Ct Angio Chest/abd/pel For Dissection W And/or W/wo  Result Date: 08/22/2016 CLINICAL DATA:  History of esophageal cancer. Pleural effusion. Rule out aneurysm or dissection. EXAM: CT ANGIOGRAPHY CHEST, ABDOMEN AND PELVIS TECHNIQUE: Multidetector CT imaging through the chest, abdomen and pelvis was performed using the standard protocol during bolus administration of intravenous contrast. Multiplanar reconstructed images and MIPs were obtained and reviewed to evaluate the vascular anatomy. CONTRAST:  100 cc Isovue 370 IV COMPARISON:  PET CT 08/14/2016 FINDINGS: CTA CHEST FINDINGS Cardiovascular: Aorta is normal caliber. No dissection. Heart is borderline in size. No filling defects in the pulmonary arteries to suggest pulmonary emboli. Mediastinum/Nodes: Changes of prior esophagectomy with gastric pull-through, stable appearance. No  mediastinal, hilar, or axillary adenopathy. Lungs/Pleura: Large left pleural effusion with compressive atelectasis in the left lower lobe, unchanged. Right lung is clear. Trace right pleural effusion. Musculoskeletal: No acute bony abnormality. Review of the MIP images confirms the above findings. CTA ABDOMEN AND PELVIS FINDINGS VASCULAR Aorta: Normal caliber.  No dissection. Celiac: Widely patent SMA: Widely patent Renals: Single bilaterally, widely patent IMA: Widely patent Inflow: Widely patent Veins: Grossly unremarkable. Review of the MIP images confirms the above findings. NON-VASCULAR Hepatobiliary: No focal hepatic abnormality. Gallbladder unremarkable. Pancreas: Mildly atrophic pancreas. No focal abnormality or ductal dilatation. Spleen: No focal abnormality.  Normal size. Adrenals/Urinary Tract: Small nodule again noted in the left adrenal gland, shown to be hypermetabolic on prior PET CT. This measures approximately 17 mm, unchanged. Right adrenal gland is unremarkable. Bilateral renal cysts. No hydronephrosis. Urinary bladder grossly unremarkable. Stomach/Bowel: Scattered sigmoid diverticulosis. No active diverticulitis. Stomach and small bowel decompressed. Jejunostomy feeding tube in place within the left small bowel loops. Lymphatic: No adenopathy. Reproductive: Prostate enlargement with central calcifications. Other: No free fluid or free air. Musculoskeletal: No acute bony abnormality. Review of the MIP images confirms the above findings. IMPRESSION: No evidence of aortic aneurysm or dissection. No evidence of pulmonary embolus. Heart is borderline in size. Stable small left adrenal nodule which was hypermetabolic on prior PET CT. Stable large left pleural effusion with compressive atelectasis in the left lower lobe. Stable appearance of the gastric pull-through post esophagectomy. Electronically Signed   By: Rolm Baptise M.D.   On: 08/22/2016 12:30   US Thoracentesis Asp Pleural Space W/img  Guide  Result Date: 08/22/2016 INDICATION: Esophageal cancer, recurrent left pleural effusion. Request made for diagnostic and therapeutic left thoracentesis. EXAM: ULTRASOUND GUIDED DIAGNOSTIC AND THERAPEUTIC LEFT THORACENTESIS MEDICATIONS: None. COMPLICATIONS: None immediate. PROCEDURE: An ultrasound guided thoracentesis was thoroughly discussed with the patient and questions answered. The benefits, risks, alternatives and complications were also discussed. The patient understands and wishes to proceed with the procedure. Written consent was obtained. Ultrasound was performed to localize and mark an adequate pocket of fluid in the left chest. The area was then prepped and draped in the normal sterile fashion. 1% Lidocaine was used for local anesthesia. Under ultrasound guidance a Safe-T-Centesis catheter was introduced. Thoracentesis was  performed. The catheter was removed and a dressing applied. FINDINGS: A total of approximately 1 liter of yellow fluid was removed. Samples were sent to the laboratory as requested by the clinical team. IMPRESSION: Successful ultrasound guided diagnostic and therapeutic left thoracentesis yielding 1 liter of pleural fluid. Read by: Rowe Robert, PA-C Electronically Signed   By: Sandi Mariscal M.D.   On: 08/22/2016 11:38   US Thoracentesis Asp Pleural Space W/img Guide  Result Date: 08/02/2016 INDICATION: Patient with history of esophageal cancer, recurrent left pleural effusion. Request made for diagnostic and therapeutic left thoracentesis. EXAM: ULTRASOUND GUIDED DIAGNOSTIC AND THERAPEUTIC LEFT THORACENTESIS MEDICATIONS: None. COMPLICATIONS: None immediate. PROCEDURE: An ultrasound guided thoracentesis was thoroughly discussed with the patient and questions answered. The benefits, risks, alternatives and complications were also discussed. The patient understands and wishes to proceed with the procedure. Written consent was obtained. Ultrasound was performed to localize and  mark an adequate pocket of fluid in the left chest. The area was then prepped and draped in the normal sterile fashion. 1% Lidocaine was used for local anesthesia. Under ultrasound guidance a Safe-T-Centesis catheter was introduced. Thoracentesis was performed. The catheter was removed and a dressing applied. FINDINGS: A total of approximately 900 cc of yellow fluid was removed. Samples were sent to the laboratory as requested by the clinical team. IMPRESSION: Successful ultrasound guided diagnostic and therapeutic left thoracentesis yielding 900 cc of pleural fluid. Read by: Rowe Robert, PA-C Electronically Signed   By: Aletta Edouard M.D.   On: 08/02/2016 10:07    PET 08/14/2016 IMPRESSION: 1. Small hypermetabolic left adrenal mass, this has not changed in size compared to 12/01/15 but has a maximum standard uptake value of 6.2 (previously 4.2). Indeterminate density and MRI characteristics, not specific for adenoma. Early metastatic disease is not readily excluded although the lack of change of size is somewhat reassuring. Surveillance recommended. 2. No other hypermetabolic lesions are identified. 3. Moderate to large left pleural effusion with passive atelectasis. 4. Esophagectomy with gastric pull-through. 5. No hypermetabolic liver lesions are seen. 6. Enlarged prostate gland.  CT angio chest/abdomen/pelvis for dissection w/wo contrast 08/22/2016 IMPRESSION: No evidence of aortic aneurysm or dissection. No evidence of pulmonary embolus. Heart is borderline in size. Stable small left adrenal nodule which was hypermetabolic on prior PET CT. Stable large left pleural effusion with compressive atelectasis in the left lower lobe. Stable appearance of the gastric pull-through post esophagectomy.  ASSESSMENT & PLAN:  76 y.o. male presented with dysphagia with solid food  1. Worsening anemia and thrombocytopenia, DIC  -Etiology unknown. He does not have overt bleeding. PET scan was done  no 08/14/2016 which showed no internal bleeding also. -His lab work up consistent with hemolytic anemia and DIC, Coombs' test was negative. ADAMTS 13 activity was 48%, not support TTP  -Today's lab reviewed with patient again, his hemoglobin has dropped to 7.4, LDH significantly elevated again, for this account also markedly activated. Supporting ongoing hemolysis and DIC. -2 units blood transfusion later today.  -He had no overt GI bleeding, but stool OB was positive. He was seen by GI Dr. Henrene Pastor in the hospital, did not recommend GI work up. I will contact Dr. Henrene Pastor again to see if he is willing to offer endoscopy  -Also I have low suspicion for primary bone marrow disease, giving his persistent DIC, no other overt etiology, I recommend a bone marrow biopsy to rule out ruled out metastatic disease in the marrow --He will be scheduled for repeat blood work  twice weekly and will receive blood transfusions, as needed. -I'll present his case in our hem conference tomorrow.  2. Distal esophageal adenocarcinoma, uT3N1M0, stage IIIA, [id number] -I previously reviewed his CT scan, PET scan, EGD, and the biopsy findings with patient and his family member in details. -The previous PET scan showed no definitive distant metastasis. 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 This will be followed in the future scan. -By EUS, he had T3 N1 stage III disease. -He completed neoadjuvant radiation with weekly Carbo and Taxol. -I previously reviewed his surgical pathology findings, he has had complete pathologic response to neoadjuvant chemotherapy and radiation, which predicts good prognosis -He recovered well from surgery, he is still on tube feeding at night, but has been eating well, will continue supportive care. -His recent CXR showed questionable left rib lesion, he denies any chest pain. -He has developed significant fatigue, anorexia, nausea, elevated  liver enzymes and significantly increased CEA level, this is very suspicious for recurrence of esophageal cancer. -I previously reviewed his recent PET with pt and his family. It showed small hypermetabolic left adrenal mass which has not changed in size compared to 12/01/15 with a maximum standard uptake value of 6.2 (previously 4.2). There are no other hypermetabolic lesions identified. Liver was unremarkable on the CT and PET. I reviewed his image with radiologist personally. -I'll consider a bone marrow biopsy to rule out metastatic cancer in the marrow, giving his persistent anemia and thrombocytopenia.  3. 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  -He has had 3 repeated thoracentesis, all cytology were negative -Continue monitoring  4. HTN -Continue medication and follow-up of his primary care physician  5. Malnutrition, nausea/vomiting, and weight loss -Doing better, he is tolerating J-tube feeding very well, he eats normally  -Follow up with dietitian  6. Anorexia and depression  -Begin mirtazapine.   7. Tachycardia -Pulse 136 today, likely secondary to his anemia .   8. Constipation -The patient has not had a bowel movement in three days. He took Miralax this morning. I encouraged him to continue use MiraLAX as needed   Plan -He will receive a blood transfusion later today and next Tuesday. -He will return for repeat blood work this Friday and will receive blood transfusion on Saturday, if needed. -He will be scheduled for repeat blood work twice weekly (Tuesday and Friday ) and will receive blood transfusions, as needed. -He is scheduled to see Dr. Servando Snare on 08/30/2016. I will send him a note about the patient's case.  -I sent a message Dr. Henrene Pastor, to see if he would offer endoscopy for his anemia workup -I will see him again on 09/11/16.   All questions were answered. The patient knows to call the clinic with any problems, questions or  concerns.  I spent 25 minutes counseling the patient face to face. The total time spent in the appointment was 30 minutes and more than 50% was on counseling.  This document serves as a record of services personally performed by Truitt Merle, MD. It was created on her behalf by Arlyce Harman, a trained medical scribe. The creation of this record is based on the scribe's personal observations and the provider's statements to them. This document has been checked and approved by the attending provider.   Truitt Merle, MD 08/27/2016

## 2016-08-27 ENCOUNTER — Encounter: Payer: Self-pay | Admitting: Hematology

## 2016-08-27 ENCOUNTER — Other Ambulatory Visit (HOSPITAL_BASED_OUTPATIENT_CLINIC_OR_DEPARTMENT_OTHER): Payer: No Typology Code available for payment source

## 2016-08-27 ENCOUNTER — Other Ambulatory Visit: Payer: Self-pay | Admitting: *Deleted

## 2016-08-27 ENCOUNTER — Ambulatory Visit (HOSPITAL_BASED_OUTPATIENT_CLINIC_OR_DEPARTMENT_OTHER): Payer: No Typology Code available for payment source | Admitting: Hematology

## 2016-08-27 ENCOUNTER — Telehealth: Payer: Self-pay | Admitting: Thoracic Surgery (Cardiothoracic Vascular Surgery)

## 2016-08-27 ENCOUNTER — Ambulatory Visit (HOSPITAL_COMMUNITY)
Admission: RE | Admit: 2016-08-27 | Discharge: 2016-08-27 | Disposition: A | Discharge status: Home / Self Care | Payer: Medicare Other | Source: Ambulatory Visit | Attending: Hematology | Admitting: Hematology

## 2016-08-27 ENCOUNTER — Encounter (HOSPITAL_COMMUNITY): Payer: Self-pay

## 2016-08-27 ENCOUNTER — Telehealth: Payer: Self-pay | Admitting: Hematology

## 2016-08-27 ENCOUNTER — Telehealth: Payer: Self-pay | Admitting: *Deleted

## 2016-08-27 VITALS — BP 103/64 | HR 136 | Temp 98.4°F | Ht 65.0 in | Wt 132.9 lb

## 2016-08-27 DIAGNOSIS — R000 Tachycardia, unspecified: Secondary | ICD-10-CM | POA: Diagnosis not present

## 2016-08-27 DIAGNOSIS — D599 Acquired hemolytic anemia, unspecified: Secondary | ICD-10-CM

## 2016-08-27 DIAGNOSIS — C155 Malignant neoplasm of lower third of esophagus: Secondary | ICD-10-CM | POA: Diagnosis not present

## 2016-08-27 DIAGNOSIS — D649 Anemia, unspecified: Secondary | ICD-10-CM | POA: Diagnosis not present

## 2016-08-27 DIAGNOSIS — D65 Disseminated intravascular coagulation [defibrination syndrome]: Secondary | ICD-10-CM

## 2016-08-27 DIAGNOSIS — E46 Unspecified protein-calorie malnutrition: Secondary | ICD-10-CM | POA: Diagnosis not present

## 2016-08-27 DIAGNOSIS — C159 Malignant neoplasm of esophagus, unspecified: Secondary | ICD-10-CM

## 2016-08-27 LAB — COMPREHENSIVE METABOLIC PANEL
ALT: 37 U/L (ref 0–55)
ANION GAP: 12 meq/L — AB (ref 3–11)
AST: 105 U/L — ABNORMAL HIGH (ref 5–34)
Albumin: 3.3 g/dL — ABNORMAL LOW (ref 3.5–5.0)
Alkaline Phosphatase: 520 U/L — ABNORMAL HIGH (ref 40–150)
BUN: 24.2 mg/dL (ref 7.0–26.0)
CO2: 23 meq/L (ref 22–29)
Calcium: 9 mg/dL (ref 8.4–10.4)
Chloride: 98 mEq/L (ref 98–109)
Creatinine: 0.7 mg/dL (ref 0.7–1.3)
Glucose: 190 mg/dl — ABNORMAL HIGH (ref 70–140)
POTASSIUM: 4.7 meq/L (ref 3.5–5.1)
SODIUM: 133 meq/L — AB (ref 136–145)
TOTAL PROTEIN: 6.1 g/dL — AB (ref 6.4–8.3)
Total Bilirubin: 1.61 mg/dL — ABNORMAL HIGH (ref 0.20–1.20)

## 2016-08-27 LAB — CBC & DIFF AND RETIC
BASO%: 0.5 % (ref 0.0–2.0)
Basophils Absolute: 0.1 10*3/uL (ref 0.0–0.1)
EOS ABS: 0 10*3/uL (ref 0.0–0.5)
EOS%: 0.2 % (ref 0.0–7.0)
HCT: 21.6 % — ABNORMAL LOW (ref 38.4–49.9)
HEMOGLOBIN: 7.2 g/dL — AB (ref 13.0–17.1)
Immature Retic Fract: 19.6 % — ABNORMAL HIGH (ref 3.00–10.60)
LYMPH#: 2.9 10*3/uL (ref 0.9–3.3)
LYMPH%: 22.3 % (ref 14.0–49.0)
MCH: 30.8 pg (ref 27.2–33.4)
MCHC: 33.3 g/dL (ref 32.0–36.0)
MCV: 92.3 fL (ref 79.3–98.0)
MONO#: 1 10*3/uL — ABNORMAL HIGH (ref 0.1–0.9)
MONO%: 7.3 % (ref 0.0–14.0)
NEUT%: 69.7 % (ref 39.0–75.0)
NEUTROS ABS: 9.1 10*3/uL — AB (ref 1.5–6.5)
Platelets: 84 10*3/uL — ABNORMAL LOW (ref 140–400)
RBC: 2.34 10*6/uL — ABNORMAL LOW (ref 4.20–5.82)
RDW: 23.9 % — AB (ref 11.0–14.6)
RETIC %: 13.79 % — AB (ref 0.80–1.80)
RETIC CT ABS: 322.69 10*3/uL — AB (ref 34.80–93.90)
WBC: 13.1 10*3/uL — AB (ref 4.0–10.3)

## 2016-08-27 LAB — TECHNOLOGIST REVIEW

## 2016-08-27 LAB — CULTURE, BODY FLUID W GRAM STAIN -BOTTLE: Culture: NO GROWTH

## 2016-08-27 LAB — LACTATE DEHYDROGENASE: LDH: 1404 U/L — ABNORMAL HIGH (ref 125–245)

## 2016-08-27 LAB — PREPARE RBC (CROSSMATCH)

## 2016-08-27 LAB — CULTURE, BODY FLUID-BOTTLE

## 2016-08-27 MED ORDER — ACETAMINOPHEN 325 MG PO TABS
650.0000 mg | ORAL_TABLET | Freq: Once | ORAL | Status: AC
  Administered 2016-08-27: 650 mg via ORAL
  Filled 2016-08-27: qty 2

## 2016-08-27 MED ORDER — SODIUM CHLORIDE 0.9 % IV SOLN
250.0000 mL | Freq: Once | INTRAVENOUS | Status: AC
  Administered 2016-08-27: 250 mL via INTRAVENOUS

## 2016-08-27 MED ORDER — HEPARIN SOD (PORK) LOCK FLUSH 100 UNIT/ML IV SOLN: 500.0000 [IU] | Freq: Every day | INTRAVENOUS | Status: DC | PRN

## 2016-08-27 MED ORDER — SODIUM CHLORIDE 0.9% FLUSH: 10.0000 mL | INTRAVENOUS | Status: DC | PRN

## 2016-08-27 NOTE — Telephone Encounter (Signed)
Patient's daughter called in to see if Dr. Servando Snare has scheduled an endoscopy  He was seen by Dr. Burr Medico earlier today. Anemia noted  She says he has been having dark stools.   No record of scheduled endoscopy in EPIC. Advised to bring patient to Ed if acutely worse, otherwise will ask Dr. Servando Snare to address in AM.  She does not want to bring  Him to ED due to flu in area  [place]. Roxan Hockey, MD [facility] [phone]

## 2016-08-27 NOTE — Telephone Encounter (Signed)
Spoke with pt to clarify re:  Pt had black stools when pt was in the hospital.  Stated his providers were aware of black stools issue.  Stated pt had one  episode of semi formed stools on Fri 08/24/16 -  NO Dark stools noted by pt. Pt has not had any bms since Friday.  Dr. Burr Medico notified.

## 2016-08-27 NOTE — Telephone Encounter (Signed)
Appointments scheduled per 2/5 LOS. Patient given AVS report and calendars with future scheduled appointments. °

## 2016-08-27 NOTE — Telephone Encounter (Signed)
""  I'm calling for my husband.  He was discharged from the hospital last Thursday afternoon.  He's so weak.  Can hardly walk through the house or from the den to the bathroom.  HGB = 8.7 when discharged.  He's loosing blood somewhere.  Can he come in earlier.  I think he needs a blood transfusion.  This is how he acts when hgb is low."  Verbal order received and read back from Dr. Burr Medico to come in now for lab.  Will work in for visit.  Called instructing to come in.  With this call reports "he has thrown up a little and acts confused.  Trying to open oatmeal packet that is already open."

## 2016-08-27 NOTE — Progress Notes (Signed)
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 Patient alert, oriented, and ambulatory at time of discharge. Discharge instructions given to patient and patient states an understanding

## 2016-08-27 NOTE — Discharge Instructions (Signed)

## 2016-08-28 ENCOUNTER — Encounter (HOSPITAL_COMMUNITY): Payer: Self-pay | Admitting: *Deleted

## 2016-08-28 ENCOUNTER — Inpatient Hospital Stay (HOSPITAL_COMMUNITY)
Admission: AD | Admit: 2016-08-28 | Discharge: 2016-09-01 | DRG: 377 | Disposition: A | Discharge status: Home Health | Payer: Medicare Other | Source: Ambulatory Visit | Attending: Internal Medicine | Admitting: Internal Medicine

## 2016-08-28 ENCOUNTER — Telehealth: Payer: Self-pay | Admitting: *Deleted

## 2016-08-28 ENCOUNTER — Inpatient Hospital Stay (HOSPITAL_COMMUNITY): Payer: Medicare Other

## 2016-08-28 DIAGNOSIS — D696 Thrombocytopenia, unspecified: Secondary | ICD-10-CM | POA: Diagnosis present

## 2016-08-28 DIAGNOSIS — K219 Gastro-esophageal reflux disease without esophagitis: Secondary | ICD-10-CM | POA: Diagnosis present

## 2016-08-28 DIAGNOSIS — D599 Acquired hemolytic anemia, unspecified: Secondary | ICD-10-CM | POA: Diagnosis not present

## 2016-08-28 DIAGNOSIS — Z823 Family history of stroke: Secondary | ICD-10-CM

## 2016-08-28 DIAGNOSIS — Z9221 Personal history of antineoplastic chemotherapy: Secondary | ICD-10-CM

## 2016-08-28 DIAGNOSIS — E785 Hyperlipidemia, unspecified: Secondary | ICD-10-CM | POA: Diagnosis present

## 2016-08-28 DIAGNOSIS — K5731 Diverticulosis of large intestine without perforation or abscess with bleeding: Principal | ICD-10-CM | POA: Diagnosis present

## 2016-08-28 DIAGNOSIS — K648 Other hemorrhoids: Secondary | ICD-10-CM | POA: Diagnosis present

## 2016-08-28 DIAGNOSIS — D65 Disseminated intravascular coagulation [defibrination syndrome]: Secondary | ICD-10-CM | POA: Diagnosis present

## 2016-08-28 DIAGNOSIS — D61818 Other pancytopenia: Secondary | ICD-10-CM

## 2016-08-28 DIAGNOSIS — R319 Hematuria, unspecified: Secondary | ICD-10-CM

## 2016-08-28 DIAGNOSIS — Z8501 Personal history of malignant neoplasm of esophagus: Secondary | ICD-10-CM

## 2016-08-28 DIAGNOSIS — R195 Other fecal abnormalities: Secondary | ICD-10-CM | POA: Diagnosis present

## 2016-08-28 DIAGNOSIS — K649 Unspecified hemorrhoids: Secondary | ICD-10-CM

## 2016-08-28 DIAGNOSIS — Z833 Family history of diabetes mellitus: Secondary | ICD-10-CM

## 2016-08-28 DIAGNOSIS — Z8711 Personal history of peptic ulcer disease: Secondary | ICD-10-CM

## 2016-08-28 DIAGNOSIS — R7989 Other specified abnormal findings of blood chemistry: Secondary | ICD-10-CM

## 2016-08-28 DIAGNOSIS — K921 Melena: Secondary | ICD-10-CM

## 2016-08-28 DIAGNOSIS — R945 Abnormal results of liver function studies: Secondary | ICD-10-CM

## 2016-08-28 DIAGNOSIS — Z800 Family history of malignant neoplasm of digestive organs: Secondary | ICD-10-CM

## 2016-08-28 DIAGNOSIS — R52 Pain, unspecified: Secondary | ICD-10-CM

## 2016-08-28 DIAGNOSIS — D649 Anemia, unspecified: Secondary | ICD-10-CM | POA: Diagnosis present

## 2016-08-28 DIAGNOSIS — C155 Malignant neoplasm of lower third of esophagus: Secondary | ICD-10-CM | POA: Diagnosis present

## 2016-08-28 DIAGNOSIS — C7951 Secondary malignant neoplasm of bone: Secondary | ICD-10-CM | POA: Diagnosis present

## 2016-08-28 DIAGNOSIS — Z6821 Body mass index (BMI) 21.0-21.9, adult: Secondary | ICD-10-CM

## 2016-08-28 DIAGNOSIS — D62 Acute posthemorrhagic anemia: Secondary | ICD-10-CM | POA: Diagnosis present

## 2016-08-28 DIAGNOSIS — Z923 Personal history of irradiation: Secondary | ICD-10-CM

## 2016-08-28 DIAGNOSIS — E440 Moderate protein-calorie malnutrition: Secondary | ICD-10-CM | POA: Diagnosis present

## 2016-08-28 DIAGNOSIS — K922 Gastrointestinal hemorrhage, unspecified: Secondary | ICD-10-CM | POA: Diagnosis present

## 2016-08-28 DIAGNOSIS — J90 Pleural effusion, not elsewhere classified: Secondary | ICD-10-CM | POA: Diagnosis present

## 2016-08-28 DIAGNOSIS — I10 Essential (primary) hypertension: Secondary | ICD-10-CM | POA: Diagnosis present

## 2016-08-28 DIAGNOSIS — Z820 Family history of epilepsy and other diseases of the nervous system: Secondary | ICD-10-CM

## 2016-08-28 DIAGNOSIS — Z87891 Personal history of nicotine dependence: Secondary | ICD-10-CM

## 2016-08-28 LAB — DIC (DISSEMINATED INTRAVASCULAR COAGULATION) PANEL
APTT: 53 s — AB (ref 24–36)
INR: 1.42
PROTHROMBIN TIME: 17.5 s — AB (ref 11.4–15.2)

## 2016-08-28 LAB — FIBRINOGEN: FIBRINOGEN: 277 mg/dL (ref 193–507)

## 2016-08-28 LAB — CBC
HEMATOCRIT: 23.9 % — AB (ref 39.0–52.0)
HEMOGLOBIN: 8.1 g/dL — AB (ref 13.0–17.0)
MCH: 29.1 pg (ref 26.0–34.0)
MCHC: 33.9 g/dL (ref 30.0–36.0)
MCV: 86 fL (ref 78.0–100.0)
Platelets: 59 10*3/uL — ABNORMAL LOW (ref 150–400)
RBC: 2.78 MIL/uL — ABNORMAL LOW (ref 4.22–5.81)
RDW: 21.9 % — AB (ref 11.5–15.5)
WBC: 6.7 10*3/uL (ref 4.0–10.5)

## 2016-08-28 LAB — BASIC METABOLIC PANEL
ANION GAP: 7 (ref 5–15)
BUN: 21 mg/dL — ABNORMAL HIGH (ref 6–20)
CHLORIDE: 101 mmol/L (ref 101–111)
CO2: 28 mmol/L (ref 22–32)
CREATININE: 0.58 mg/dL — AB (ref 0.61–1.24)
Calcium: 8.5 mg/dL — ABNORMAL LOW (ref 8.9–10.3)
GFR calc non Af Amer: >60 mL/min (ref >60)
Glucose, Bld: 114 mg/dL — ABNORMAL HIGH (ref 65–99)
POTASSIUM: 4.7 mmol/L (ref 3.5–5.1)
SODIUM: 136 mmol/L (ref 135–145)

## 2016-08-28 LAB — DIC (DISSEMINATED INTRAVASCULAR COAGULATION)PANEL
Fibrinogen: 278 mg/dL (ref 210–475)
Platelets: 52 10*3/uL — ABNORMAL LOW (ref 150–400)

## 2016-08-28 MED ORDER — ATENOLOL 25 MG PO TABS
12.5000 mg | ORAL_TABLET | Freq: Every day | ORAL | Status: DC
  Administered 2016-08-28 – 2016-08-31 (×3): 12.5 mg via ORAL
  Filled 2016-08-28 (×4): qty 1

## 2016-08-28 MED ORDER — OXYCODONE HCL 5 MG PO TABS: 5.0000 mg | ORAL_TABLET | Freq: Four times a day (QID) | ORAL | Status: DC | PRN

## 2016-08-28 MED ORDER — PANTOPRAZOLE SODIUM 40 MG IV SOLR
40.0000 mg | Freq: Two times a day (BID) | INTRAVENOUS | Status: DC
  Administered 2016-08-28 – 2016-08-31 (×6): 40 mg via INTRAVENOUS
  Filled 2016-08-28 (×5): qty 40

## 2016-08-28 MED ORDER — ONDANSETRON 4 MG PO TBDP: 8.0000 mg | ORAL_TABLET | Freq: Three times a day (TID) | ORAL | Status: DC | PRN

## 2016-08-28 MED ORDER — ACETAMINOPHEN 325 MG PO TABS
650.0000 mg | ORAL_TABLET | Freq: Four times a day (QID) | ORAL | Status: DC | PRN
  Administered 2016-08-31: 650 mg via ORAL
  Filled 2016-08-28: qty 2

## 2016-08-28 MED ORDER — OSMOLITE 1.5 CAL PO LIQD
75.0000 mL/h | ORAL | Status: DC
  Administered 2016-08-28: 75 mL/h
  Filled 2016-08-28 (×6): qty 237

## 2016-08-28 MED ORDER — MIRTAZAPINE 15 MG PO TABS
15.0000 mg | ORAL_TABLET | Freq: Every day | ORAL | Status: DC
Start: 2016-08-28 — End: 2016-09-01
  Administered 2016-08-28 – 2016-08-31 (×4): 15 mg via ORAL
  Filled 2016-08-28 (×4): qty 1

## 2016-08-28 MED ORDER — CYANOCOBALAMIN 250 MCG PO TABS
1000.0000 ug | ORAL_TABLET | Freq: Every day | ORAL | Status: DC
Start: 2016-08-29 — End: 2016-09-01
  Administered 2016-08-29 – 2016-09-01 (×2): 1000 ug via ORAL
  Filled 2016-08-28 (×2): qty 4

## 2016-08-28 MED ORDER — ATENOLOL 25 MG PO TABS
25.0000 mg | ORAL_TABLET | Freq: Every day | ORAL | Status: DC
  Administered 2016-08-29 – 2016-09-01 (×2): 25 mg via ORAL
  Filled 2016-08-28 (×3): qty 1

## 2016-08-28 MED ORDER — FOLIC ACID 1 MG PO TABS
1.0000 mg | ORAL_TABLET | Freq: Every day | ORAL | Status: DC
  Administered 2016-08-29 – 2016-09-01 (×2): 1 mg via ORAL
  Filled 2016-08-28 (×3): qty 1

## 2016-08-28 MED ORDER — ONDANSETRON HCL 4 MG/2ML IJ SOLN: 4.0000 mg | Freq: Four times a day (QID) | INTRAMUSCULAR | Status: DC | PRN

## 2016-08-28 MED ORDER — SODIUM CHLORIDE 0.9 % IV SOLN: INTRAVENOUS | Status: DC

## 2016-08-28 NOTE — H&P (Addendum)
History and Physical    Fred [name] [id number] DOB: 1940-07-28 DOA: 08/28/2016   PCP: [name], MD Chief Complaint: No chief complaint on file.   HPI: Fred [name] is a 76 y.o. male with medical history significant of esophageal cancer, initially diagnosed in May 2017, status post neoadjuvant chemo and radiation therapy followed by surgical resection in October: status post esophagectomy, cervical esophagogastrostomy and pyloroplasty, and J-tube placement.  He had been doing well and PET scan as recent as 08/14/16 demonstrated no obvious recurrence other than a single hypermetabolic Adrenal gland nodule, but this has been present and remains unchanged since May of last year.  Due to worsening symptoms of fatigue, low grade fevers, chills, weakness.  He was admitted to our service on 1/25 for apparent hemolytic anemia with HGB drop from 14 to 6.4.  This appears to be c/w DIC.  His symptoms had improved and he was discharged, but then over the course of the past week they worsened again and HGB was down to 7.2 as of yesterday.  He was transfused 2 units PRBC yesterday and HGB is 8.1 today.  Platelets have trended down to 59 today (their lowest).  Due to patient also having heme positive stools during stay last month which has now progressed to "black tarry stools".  Patient has been re-admitted for GI consult and likely endoscopy.  Patient and family also report that he has had severe N/V over the past week and wonder "if he could have torn something".  Review of Systems: As per HPI otherwise 10 point review of systems negative.    Past Medical History:  Diagnosis Date  . Esophageal cancer ([place]) 11/23/15   lower 3rd esohagus   . GERD (gastroesophageal reflux disease)   . Hyperlipidemia   . Hypertension     Past Surgical History:  Procedure Laterality Date  . CHEST TUBE INSERTION Left 04/23/2016   Procedure: CHEST TUBE INSERTION;  Surgeon: [name], MD;  Location:  [facility];  Service: Thoracic;  Laterality: Left;  . COMPLETE ESOPHAGECTOMY N/A 04/23/2016   Procedure: TRANSHIATIAL TOTAL ESOPHAGECTOMY COMPLETE; CERVICAL ESOPHAGOGASTROSTOMY AND PYLOROPLASTY;  Surgeon: [name], MD;  Location: [facility];  Service: Thoracic;  Laterality: N/A;  . cystoscope  4/12/1   prostate  . ERCP    . INGUINAL HERNIA REPAIR    . JEJUNOSTOMY N/A 04/23/2016   Procedure: FEEDING JEJUNOSTOMY;  Surgeon: [name], MD;  Location: [facility];  Service: Thoracic;  Laterality: N/A;  . LEG SURGERY Left    hole  . PROSTATE BIOPSY  10/05/15  . right shoulder surgery    . VIDEO BRONCHOSCOPY N/A 04/23/2016   Procedure: VIDEO BRONCHOSCOPY;  Surgeon: [name], MD;  Location: [facility];  Service: Thoracic;  Laterality: N/A;     reports that he quit smoking about 49 years ago. He has a 10.00 pack-year smoking history. He has never used smokeless tobacco. He reports that he does not drink alcohol or use drugs.  Allergies  Allergen Reactions  . No Known Allergies     Family History  Problem Relation Age of Onset  . Cancer Maternal Grandfather 26    gastric cancer   . Alzheimer's disease Mother   . Diabetes Mother   . Stroke Father 61  . Multiple sclerosis Sister       Prior to Admission medications   Medication Sig Start Date End Date Taking? Authorizing Provider  acetaminophen (TYLENOL) 325 MG tablet Take 650 mg by mouth  every 6 (six) hours as needed.    Historical Provider, MD  atenolol (TENORMIN) 25 MG tablet Take 1 tab in the morning and half tab in the evening Patient taking differently: Take 12.5-25 mg by mouth 2 (two) times daily. Take 25mg  in the morning and 12.5mg  in the evening 05/24/16   Evelene Croon Barrett, PA-C  folic acid (FOLVITE) 1 MG tablet Take 1 tablet (1 mg total) by mouth daily. 08/23/16   Rich Fuchs Choi, DO  mirtazapine (REMERON) 15 MG tablet Take 1 tablet (15 mg total) by mouth at bedtime. 08/15/16   Truitt Merle, MD  Nutritional Supplements  (FEEDING SUPPLEMENT, OSMOLITE 1.5 CAL,) LIQD Increase Osmolite 1.5 to 75 mL/hr for 16 hours daily with 140 mL water before and after continuous feeding. Flush or drink additional 600 mL free water daily. Send formula and bags. 08/13/16   Truitt Merle, MD  ondansetron (ZOFRAN ODT) 8 MG disintegrating tablet Take 1 tablet (8 mg total) by mouth every 8 (eight) hours as needed for nausea or vomiting. 08/15/16   Truitt Merle, MD  oxycodone (OXY-IR) 5 MG capsule Take 5 mg by mouth every 6 (six) hours as needed.    Historical Provider, MD  vitamin B-12 1000 MCG tablet Take 1 tablet (1,000 mcg total) by mouth daily. 08/23/16   Shon Millet, DO    Physical Exam: Vitals:   08/28/16 1830 08/28/16 1840  BP: 128/66   Pulse: 82   Resp: 18   Temp: 99.2 F (37.3 C)   TempSrc: Oral   SpO2: 100%   Weight:  59.1 kg (130 lb 4.8 oz)  Height:  5\' 5"  (1.651 m)      Constitutional: NAD, calm, comfortable Eyes: PERRL, lids and conjunctivae normal ENMT: Mucous membranes are moist. Posterior pharynx clear of any exudate or lesions.Normal dentition.  Neck: normal, supple, no masses, no thyromegaly Respiratory: clear to auscultation bilaterally, no wheezing, no crackles. Normal respiratory effort. No accessory muscle use.  Cardiovascular: Regular rate and rhythm, no murmurs / rubs / gallops. No extremity edema. 2+ pedal pulses. No carotid bruits.  Abdomen: no tenderness, no masses palpated. No hepatosplenomegaly. Bowel sounds positive.  Musculoskeletal: no clubbing / cyanosis. No joint deformity upper and lower extremities. Good ROM, no contractures. Normal muscle tone.  Skin: no rashes, lesions, ulcers. No induration Neurologic: CN 2-12 grossly intact. Sensation intact, DTR normal. Strength 5/5 in all 4.  Psychiatric: Normal judgment and insight. Alert and oriented x 3. Normal mood.    Labs on Admission: I have personally reviewed following labs and imaging studies  CBC:  Recent Labs Lab 08/22/16 0420  08/23/16 0439 08/27/16 0912 08/28/16 1950  WBC 9.4 9.9 13.1* 6.7  NEUTROABS  --  7.1 9.1*  --   HGB 8.9* 8.7* 7.2* 8.1*  HCT 25.3* 25.9* 21.6* 23.9*  MCV 87.5 86.9 92.3 86.0  PLT 90* 106* 84* 52*  59*   Basic Metabolic Panel:  Recent Labs Lab 08/22/16 0420 08/23/16 0439 08/27/16 0912 08/28/16 1950  NA 135 135 133* 136  K 4.2 4.3 4.7 4.7  CL 99* 98*  --  101  CO2 26 28 23 28   GLUCOSE 205* 168* 190* 114*  BUN 25* 27* 24.2 21*  CREATININE 0.59* 0.53* 0.7 0.58*  CALCIUM 8.6* 8.5* 9.0 8.5*   GFR: Estimated Creatinine Clearance: 65.7 mL/min (by C-G formula based on SCr of 0.58 mg/dL (L)). Liver Function Tests:  Recent Labs Lab 08/22/16 0420 08/27/16 0912  AST 50* 105*  ALT 48  37  ALKPHOS 299* 520*  BILITOT 1.5* 1.61*  PROT 5.4* 6.1*  ALBUMIN 3.1* 3.3*   No results for input(s): LIPASE, AMYLASE in the last 168 hours. No results for input(s): AMMONIA in the last 168 hours. Coagulation Profile:  Recent Labs Lab 08/28/16 1950  INR 1.42   Cardiac Enzymes: No results for input(s): CKTOTAL, CKMB, CKMBINDEX, TROPONINI in the last 168 hours. BNP (last 3 results) No results for input(s): PROBNP in the last 8760 hours. HbA1C: No results for input(s): HGBA1C in the last 72 hours. CBG:  Recent Labs Lab 08/22/16 0628 08/22/16 1557 08/22/16 2006 08/23/16 0013 08/23/16 0414  GLUCAP 195* 134* 109* 182* 189*   Lipid Profile: No results for input(s): CHOL, HDL, LDLCALC, TRIG, CHOLHDL, LDLDIRECT in the last 72 hours. Thyroid Function Tests: No results for input(s): TSH, T4TOTAL, FREET4, T3FREE, THYROIDAB in the last 72 hours. Anemia Panel:  Recent Labs  08/27/16 0912  RETICCTPCT 13.79*   Urine analysis:    Component Value Date/Time   COLORURINE AMBER (A) 08/16/2016 0429   APPEARANCEUR CLEAR 08/16/2016 0429   LABSPEC 1.025 08/16/2016 0429   LABSPEC 1.010 08/15/2016 1224   PHURINE 5.0 08/16/2016 0429   GLUCOSEU NEGATIVE 08/16/2016 0429   GLUCOSEU Negative  08/15/2016 1224   HGBUR NEGATIVE 08/16/2016 0429   BILIRUBINUR NEGATIVE 08/16/2016 0429   BILIRUBINUR Negative 08/15/2016 1224   KETONESUR NEGATIVE 08/16/2016 0429   PROTEINUR 30 (A) 08/16/2016 0429   UROBILINOGEN 0.2 08/15/2016 1224   NITRITE NEGATIVE 08/16/2016 0429   LEUKOCYTESUR NEGATIVE 08/16/2016 0429   LEUKOCYTESUR Negative 08/15/2016 1224   Sepsis Labs: @LABRCNTIP (procalcitonin:4,lacticidven:4) ) Recent Results (from the past 240 hour(s))  Culture, body fluid-bottle     Status: None   Collection Time: 08/22/16 11:22 AM  Result Value Ref Range Status   Specimen Description PLEURAL LEFT  Final   Special Requests NONE  Final   Culture   Final    NO GROWTH 5 DAYS Performed at [facility] [address] [place], [place] [address]    Report Status 08/27/2016 FINAL  Final  Gram stain     Status: None   Collection Time: 08/22/16 11:22 AM  Result Value Ref Range Status   Specimen Description FLUID LEFT PLEURAL  Final   Special Requests NONE  Final   Gram Stain   Final    RARE WBC PRESENT, PREDOMINANTLY MONONUCLEAR NO ORGANISMS SEEN Performed at [facility], [place] [address], [place], [place] [address]    Report Status 08/23/2016 FINAL  Final  TECHNOLOGIST REVIEW     Status: None   Collection Time: 08/27/16  9:12 AM  Result Value Ref Range Status   Technologist Review   Final    Metas and Myelocytes present, 5% nRBCs, moderate helmets, few spherocytes, sl basophilic stippling     Radiological Exams on Admission: No results found.  EKG: Independently reviewed.  Assessment/Plan Principal Problem:   Acquired hemolytic anemia (HCC) Active Problems:   Malignant neoplasm of lower third of esophagus (HCC)   Thrombocytopenia (HCC)   Heme positive stool   DIC (disseminated intravascular coagulation) ([place])    1. DIC - 1. Unclear underlying cause 2. PET scan negative on 1/23 for obvious CA recurrence 3. Future work up planned includes bone barrow Bx by  Dr. Burr Medico 4. Dr. Burr Medico is following 5. Schistocytes noted in smear 6. Fibrinogen level is nl 7. Will have path review smear since tech review of smear yesterday noted metas and myleos 1. But  there is no mention of blasts or auer rods. 2. And Dr. Burr Medico has low suspicion of primary bone marrow problem according to office note from yesterday. 3. So AML seems less likely. 2. Heme positive stool - 1. GI to see in AM 2. Per Dr. Henrene Pastor: 1. NPO after midnight 2. Will hold off on bowel prep for the moment since upper EGD most likely to be done tomorrow 3. And it sounds more likely to be upper GI source with reported melena 1. ?Marginal ulcer from all the surgeries he had done in October 2. ?Mucosal tear from severe N/V (IE [facility]-Weiss) 4. Will put him on empiric PPI 3. Esophageal carcinoma - s/p chemo, radiation, surgery 1. Currently no evidence of recurrence other than the unexplained DIC 2. PET scan 1/23 neg other than the small adrenal nodule which is unchanged since May 3. CT angio of chest / abd / pelvis on 1/31 also neg. 4. Pleural effusion - Pleural effusion that was drained during last hospital stay 1. Cytology just showed reactive cells and a few lymphocytes 2. Will CXR to see if there is any recurrence 5. Symptomatic anemia - 1. HGB up to 8.1 after 2 unit PRBC transfusion for 7.2 yesterday 2. Repeat CBC in AM 3. Unclear how much of this is due to hemolysis vs GIB (see above) 6. Thrombocytopenia - 1. Repeat CBC in AM 2. Due to DIC as discussed above 3. Not believed to be TTP due to ADAMTS 13 activity being 48% 1. See Dr. Ernestina Penna office note from yesterday   DVT prophylaxis: SCDs Code Status: Full Family Communication: Family at bedside Consults called: Spoke with Dr. Henrene Pastor Admission status: Admit to inpatient   Etta Quill DO [facility] Pager [phone] from 7PM-7AM  If 7AM-7PM, please contact the day physician for the patient [email or web address] Password  [email or web address]  08/28/2016, 8:51 PM

## 2016-08-28 NOTE — Telephone Encounter (Signed)
Ment to say unfortunately (sorry).

## 2016-08-28 NOTE — Telephone Encounter (Signed)
Fred [name] and Fred [name], I'm happy to help. Obviously a complex, sick patient who has seen several other GI doctors ([facility], [facility]) in the past year.  Given the acuity, I am not comfortable meeting him for the first time for an outpatient procedure and so I think it is safest that he be  admitted to the hospital (either through ER or directly by hospitalists) and then we could see him in consultation.  We are covering [facility] unassigned this month.  Thanks

## 2016-08-28 NOTE — Telephone Encounter (Signed)
John, thanks so much. I spoke with pt, and he really prefers to do it as outpt if possible. He is concerned about if [facility] will cover his admission.  I have called Dr. Alice Reichert but he has not called me back yet. If there any other MD in your practice can do the endoscopy for him this week?   Linna Hoff, I know you are on service this week. If I do need to admit him for endoscopy (EGD+COLO), where should I admit him to? It looks you are covering for [facility] If possible, may I admit him the day before his procedure? He does not want to sit in the hospital to wait for the procedure.   Thanks much to all,   Truitt Merle MD

## 2016-08-28 NOTE — Telephone Encounter (Signed)
Spoke with pt and was informed that pt had a bm this morning and color was black.  Informed pt that Dr. Burr Medico had spoken with his GI Dr. Henrene Pastor.  Informed pt that Dr. Blanch Media office will contact pt with further information.  Pt voiced understanding.

## 2016-08-28 NOTE — Telephone Encounter (Signed)
S/w pt and wife that Dr Burr Medico is still communication with [facility] about his care. She will call the pt later today. In the interim pt can eat. Pt was staying NPO today for possible endo/colonoscopy.

## 2016-08-28 NOTE — Telephone Encounter (Signed)
I spoke with Dr. Alice Reichert this afternoon, to see if he can do the endoscopy as outpt. After a long discussion, he felt his risk for procedure is too high for outpt, and he recommended hospital admission for the procedure.   I spoke with Dr. Ardis Hughs also and he will see pt tomorrow in the hospital when he is admitted. Thanks much.  I spoke with hospitalist Dr. Allyson Sabal and she kindly agreed to admit pt tonight   I called pt and he agrees to come in tonight. Called bed assignment, he has a bed and will be admitted tonight.   Truitt Merle MD

## 2016-08-28 NOTE — Telephone Encounter (Signed)
Ms. Xiomar, [place] Fred [name]'s daughter, has been in contact with Dr. Servando Snare since yesterday with concerns regarding her father's need for transfusions, vomiting anything he eats and weakness. He is being closely observed and treated by Dr. Burr Medico. Dr. Servando Snare feels he needs an endoscopy by Thursday and has reached out to Dr. Henrene Pastor, who will be back in his office today. He also suggests that he go to the ED and be admitted by the hospitalists. His daughter says he refuses to do that with concerns of "catchiing the flu." She says she will call Dr. Burr Medico and see if she will direct admit him to the hospital.

## 2016-08-28 NOTE — Telephone Encounter (Signed)
Linna Hoff is the hospital doctor. I cannot speak for his schedule, however. I did notice that you've copied him on this message

## 2016-08-28 NOTE — Telephone Encounter (Signed)
I received messages this morning regarding Fred [name]. Fortunately, I have no immediate availability for endoscopic procedures. I believe his primary gastroenterologist is Dr. Alice Reichert in [facility]. In any event, it may be most efficient to have him admitted to the hospital for GI workup given his multiple issues. Thanks

## 2016-08-29 ENCOUNTER — Encounter (HOSPITAL_COMMUNITY): Payer: Self-pay | Admitting: *Deleted

## 2016-08-29 ENCOUNTER — Encounter (HOSPITAL_COMMUNITY)
Admission: AD | Disposition: A | Discharge status: Home Health | Payer: Self-pay | Source: Ambulatory Visit | Attending: Internal Medicine

## 2016-08-29 ENCOUNTER — Observation Stay (HOSPITAL_COMMUNITY): Payer: Medicare Other | Admitting: Certified Registered Nurse Anesthetist

## 2016-08-29 ENCOUNTER — Ambulatory Visit: Payer: No Typology Code available for payment source | Admitting: Hematology

## 2016-08-29 DIAGNOSIS — K5731 Diverticulosis of large intestine without perforation or abscess with bleeding: Secondary | ICD-10-CM | POA: Diagnosis not present

## 2016-08-29 DIAGNOSIS — K922 Gastrointestinal hemorrhage, unspecified: Secondary | ICD-10-CM | POA: Diagnosis present

## 2016-08-29 DIAGNOSIS — D599 Acquired hemolytic anemia, unspecified: Secondary | ICD-10-CM | POA: Diagnosis not present

## 2016-08-29 DIAGNOSIS — D649 Anemia, unspecified: Secondary | ICD-10-CM

## 2016-08-29 DIAGNOSIS — J90 Pleural effusion, not elsewhere classified: Secondary | ICD-10-CM

## 2016-08-29 DIAGNOSIS — D696 Thrombocytopenia, unspecified: Secondary | ICD-10-CM

## 2016-08-29 DIAGNOSIS — D65 Disseminated intravascular coagulation [defibrination syndrome]: Secondary | ICD-10-CM | POA: Diagnosis not present

## 2016-08-29 DIAGNOSIS — I10 Essential (primary) hypertension: Secondary | ICD-10-CM | POA: Diagnosis not present

## 2016-08-29 DIAGNOSIS — E440 Moderate protein-calorie malnutrition: Secondary | ICD-10-CM | POA: Diagnosis not present

## 2016-08-29 DIAGNOSIS — K921 Melena: Secondary | ICD-10-CM | POA: Diagnosis not present

## 2016-08-29 DIAGNOSIS — C155 Malignant neoplasm of lower third of esophagus: Secondary | ICD-10-CM

## 2016-08-29 HISTORY — PX: ESOPHAGOGASTRODUODENOSCOPY (EGD) WITH PROPOFOL: SHX5813

## 2016-08-29 LAB — CBC
HCT: 22.4 % — ABNORMAL LOW (ref 39.0–52.0)
Hemoglobin: 7.7 g/dL — ABNORMAL LOW (ref 13.0–17.0)
MCH: 30.8 pg (ref 26.0–34.0)
MCHC: 34.4 g/dL (ref 30.0–36.0)
MCV: 89.6 fL (ref 78.0–100.0)
PLATELETS: 58 10*3/uL — AB (ref 150–400)
RBC: 2.5 MIL/uL — ABNORMAL LOW (ref 4.22–5.81)
RDW: 23.2 % — AB (ref 11.5–15.5)
WBC: 6.1 10*3/uL (ref 4.0–10.5)

## 2016-08-29 LAB — TYPE AND SCREEN
ABO/RH(D): O POS
Antibody Screen: NEGATIVE
UNIT DIVISION: 0
Unit division: 0

## 2016-08-29 SURGERY — ESOPHAGOGASTRODUODENOSCOPY (EGD) WITH PROPOFOL
Anesthesia: Monitor Anesthesia Care

## 2016-08-29 MED ORDER — LACTATED RINGERS IV SOLN
INTRAVENOUS | Status: DC
  Administered 2016-08-29: 1000 mL via INTRAVENOUS

## 2016-08-29 MED ORDER — PROPOFOL 10 MG/ML IV BOLUS
INTRAVENOUS | Status: AC
  Filled 2016-08-29: qty 20

## 2016-08-29 MED ORDER — OSMOLITE 1.5 CAL PO LIQD
75.0000 mL/h | ORAL | Status: AC
  Administered 2016-08-29: 75 mL/h
  Filled 2016-08-29 (×3): qty 237

## 2016-08-29 MED ORDER — LIDOCAINE 2% (20 MG/ML) 5 ML SYRINGE
INTRAMUSCULAR | Status: AC
  Filled 2016-08-29: qty 5

## 2016-08-29 MED ORDER — PROPOFOL 500 MG/50ML IV EMUL
INTRAVENOUS | Status: DC | PRN
  Administered 2016-08-29: 100 ug/kg/min via INTRAVENOUS

## 2016-08-29 MED ORDER — LIDOCAINE 2% (20 MG/ML) 5 ML SYRINGE
INTRAMUSCULAR | Status: DC | PRN
  Administered 2016-08-29: 100 mg via INTRAVENOUS

## 2016-08-29 MED ORDER — PEG-KCL-NACL-NASULF-NA ASC-C 100 G PO SOLR
0.5000 | Freq: Once | ORAL | Status: AC
  Administered 2016-08-29: 100 g via ORAL
  Filled 2016-08-29: qty 1

## 2016-08-29 MED ORDER — PHENYLEPHRINE 40 MCG/ML (10ML) SYRINGE FOR IV PUSH (FOR BLOOD PRESSURE SUPPORT)
PREFILLED_SYRINGE | INTRAVENOUS | Status: DC | PRN
  Administered 2016-08-29 (×2): 80 ug via INTRAVENOUS

## 2016-08-29 MED ORDER — PEG-KCL-NACL-NASULF-NA ASC-C 100 G PO SOLR: 0.5000 | ORAL | Status: DC

## 2016-08-29 MED ORDER — PEG-KCL-NACL-NASULF-NA ASC-C 100 G PO SOLR
0.5000 | ORAL | Status: DC
  Filled 2016-08-29: qty 1

## 2016-08-29 MED ORDER — PROPOFOL 10 MG/ML IV BOLUS
INTRAVENOUS | Status: DC | PRN
  Administered 2016-08-29: 40 mg via INTRAVENOUS
  Administered 2016-08-29: 30 mg via INTRAVENOUS
  Administered 2016-08-29: 20 mg via INTRAVENOUS

## 2016-08-29 MED ORDER — PHENYLEPHRINE 40 MCG/ML (10ML) SYRINGE FOR IV PUSH (FOR BLOOD PRESSURE SUPPORT)
PREFILLED_SYRINGE | INTRAVENOUS | Status: AC
  Filled 2016-08-29: qty 10

## 2016-08-29 MED ORDER — SODIUM CHLORIDE 0.9 % IV SOLN: INTRAVENOUS | Status: DC

## 2016-08-29 SURGICAL SUPPLY — 14 items

## 2016-08-29 NOTE — Interval H&P Note (Signed)
History and Physical Interval Note:  08/29/2016 1:42 PM  Fred [name]  has presented today for surgery, with the diagnosis of Melena  The various methods of treatment have been discussed with the patient and family. After consideration of risks, benefits and other options for treatment, the patient has consented to  Procedure(s): ESOPHAGOGASTRODUODENOSCOPY (EGD) WITH PROPOFOL (N/A) as a surgical intervention .  The patient's history has been reviewed, patient examined, no change in status, stable for surgery.  I have reviewed the patient's chart and labs.  Questions were answered to the patient's satisfaction.     Milus Banister

## 2016-08-29 NOTE — Progress Notes (Signed)
Fred [name]   DOB:1941-07-17   [id number]   [id number]  Hematology and Oncology follow up  Subjective: Patient was admitted yesterday for worsening anemia.  He was seen by gastroenterologist Dr. Ardis Hughs, underwent EGD this afternoon on. His Hb 7.7 this morning, will have blood transfusion again today. No other new complains.   Objective:  Vitals:   08/29/16 1425 08/29/16 1430  BP: 102/62 (!) 109/92  Pulse: 83 80  Resp: 16 (!) 24  Temp:      Body mass index is 21.68 kg/m.  Intake/Output Summary (Last 24 hours) at 08/29/16 1941 Last data filed at 08/29/16 1800  Gross per 24 hour  Intake          1623.75 ml  Output              500 ml  Net          1123.75 ml     Sclerae unicteric  Oropharynx clear  No peripheral adenopathy  Lungs clear -- no rales or rhonchi  Heart regular rate and rhythm  Abdomen benign  MSK no focal spinal tenderness, no peripheral edema  Neuro nonfocal   CBG (last 3)  No results for input(s): GLUCAP in the last 72 hours.   Labs:  Lab Results  Component Value Date   WBC 6.1 08/29/2016   HGB 7.7 (L) 08/29/2016   HCT 22.4 (L) 08/29/2016   MCV 89.6 08/29/2016   PLT 58 (L) 08/29/2016   NEUTROABS 9.1 (H) 08/27/2016   CMP Latest Ref Rng & Units 08/28/2016 08/27/2016 08/23/2016  Glucose 65 - 99 mg/dL 114(H) 190(H) 168(H)  BUN 6 - 20 mg/dL 21(H) 24.2 27(H)  Creatinine 0.61 - 1.24 mg/dL 0.58(L) 0.7 0.53(L)  Sodium 135 - 145 mmol/L 136 133(L) 135  Potassium 3.5 - 5.1 mmol/L 4.7 4.7 4.3  Chloride 101 - 111 mmol/L 101 - 98(L)  CO2 22 - 32 mmol/L 28 23 28   Calcium 8.9 - 10.3 mg/dL 8.5(L) 9.0 8.5(L)  Total Protein 6.4 - 8.3 g/dL - 6.1(L) -  Total Bilirubin 0.20 - 1.20 mg/dL - 1.61(H) -  Alkaline Phos 40 - 150 U/L - 520(H) -  AST 5 - 34 U/L - 105(H) -  ALT 0 - 55 U/L - 37 -    Urine Studies No results for input(s): UHGB, CRYS in the last 72 hours.  Invalid input(s): UACOL, UAPR, USPG, UPH, UTP, UGL, UKET, UBIL, UNIT, UROB, ULEU, UEPI, UWBC, URBC,  Lance Bosch, [place]   Basic Metabolic Panel:  Recent Labs Lab 08/23/16 0439 08/27/16 0912 08/28/16 1950  NA 135 133* 136  K 4.3 4.7 4.7  CL 98*  --  101  CO2 28 23 28   GLUCOSE 168* 190* 114*  BUN 27* 24.2 21*  CREATININE 0.53* 0.7 0.58*  CALCIUM 8.5* 9.0 8.5*   GFR Estimated Creatinine Clearance: 65.7 mL/min (by C-G formula based on SCr of 0.58 mg/dL (L)). Liver Function Tests:  Recent Labs Lab 08/27/16 0912  AST 105*  ALT 37  ALKPHOS 520*  BILITOT 1.61*  PROT 6.1*  ALBUMIN 3.3*   No results for input(s): LIPASE, AMYLASE in the last 168 hours. No results for input(s): AMMONIA in the last 168 hours. Coagulation profile  Recent Labs Lab 08/28/16 1950  INR 1.42    CBC:  Recent Labs Lab 08/23/16 0439 08/27/16 0912 08/28/16 1950 08/29/16 0539  WBC 9.9 13.1* 6.7 6.1  NEUTROABS 7.1 9.1*  --   --   HGB 8.7* 7.2* 8.1*  7.7*  HCT 25.9* 21.6* 23.9* 22.4*  MCV 86.9 92.3 86.0 89.6  PLT 106* 84* 52*  59* 58*   Cardiac Enzymes: No results for input(s): CKTOTAL, CKMB, CKMBINDEX, TROPONINI in the last 168 hours. BNP: Invalid input(s): POCBNP CBG:  Recent Labs Lab 08/22/16 2006 08/23/16 0013 08/23/16 0414  GLUCAP 109* 182* 189*   D-Dimer  Recent Labs  08/28/16 1950  DDIMER >20.00*   Hgb A1c No results for input(s): HGBA1C in the last 72 hours. Lipid Profile No results for input(s): CHOL, HDL, LDLCALC, TRIG, CHOLHDL, LDLDIRECT in the last 72 hours. Thyroid function studies No results for input(s): TSH, T4TOTAL, T3FREE, THYROIDAB in the last 72 hours.  Invalid input(s): FREET3 Anemia work up  [employer]  08/27/16 0912  RETICCTPCT 13.79*   Microbiology Recent Results (from the past 240 hour(s))  Culture, body fluid-bottle     Status: None   Collection Time: 08/22/16 11:22 AM  Result Value Ref Range Status   Specimen Description PLEURAL LEFT  Final   Special Requests NONE  Final   Culture   Final    NO GROWTH 5 DAYS Performed at  [facility], [address], [place], [place] [address]    Report Status 08/27/2016 FINAL  Final  Gram stain     Status: None   Collection Time: 08/22/16 11:22 AM  Result Value Ref Range Status   Specimen Description FLUID LEFT PLEURAL  Final   Special Requests NONE  Final   Gram Stain   Final    RARE WBC PRESENT, PREDOMINANTLY MONONUCLEAR NO ORGANISMS SEEN Performed at [facility], [place] [address], [place], [place] [address]    Report Status 08/23/2016 FINAL  Final  TECHNOLOGIST REVIEW     Status: None   Collection Time: 08/27/16  9:12 AM  Result Value Ref Range Status   Technologist Review   Final    Metas and Myelocytes present, 5% nRBCs, moderate helmets, few spherocytes, sl basophilic stippling      Studies:  Dg Chest Port 1 View  Result Date: 08/28/2016 CLINICAL DATA:  Pleural effusions. Previous smoker. History of esophageal cancer and hypertension. EXAM: PORTABLE CHEST 1 VIEW COMPARISON:  08/22/2016 FINDINGS: Small left pleural effusion is increasing in size since previous study. Atelectasis in the left lung base. Right lung is clear. Emphysematous changes bilaterally. Heart size and pulmonary vascularity are normal. Degenerative changes in the spine and shoulders. IMPRESSION: Small but increasing left pleural effusion and basilar atelectasis. Emphysematous changes in the lungs. Electronically Signed   By: Lucienne Capers M.D.   On: 08/28/2016 22:50    Assessment: 76 y.o. with past medical history of distal esophageal adenocarcinoma, stage IIIa, status post concurrent chemotherapy and radiation, and esophagectomy in October 2017, presented with worsening anemia, and moderate thrombocytopenia   1. Hemolytic anemia and moderate thrombocytopenia, secondary to DIC 2. History of distal esophagus adenocarcinoma, status post concurrent neoadjuvant chemoirradiation, and esophagectomy, NED on PET 08/14/2016.  4. Elevated AST and bilirubin, likely secondary to hemolysis, no  evidence of biliary obstruction or cholecystitis 5. Recurrent left pleural effusion, cytplogy and cultures (-) 6. Hypertension 7. Malnutrition, anorexia, on tube feeds, OK for oral intake    Plan:  -He had extensive work up during his recent hospitalization. The etiology of his DIC is still unclear, recurrent or second malignancy needs to be ruled out -he will have colonoscopy tomorrow to rule out malignancy and source of bleeding. If negative, please consult IR for bone marrow aspiration  and biopsy, rule out metastatic bone disease or primary marrow disease  -I will order lab test to rule out PNH and G6PD deficiency   -monitor CBC daily, fibrinogen and LDH every other day  -will follow up   Truitt Merle, MD 08/29/2016

## 2016-08-29 NOTE — Progress Notes (Signed)
Initial Nutrition Assessment  DOCUMENTATION CODES:    Will assess at follow-up if pt meets criteria for malnutrition  INTERVENTION:  - Will order TF at follow-up.  NUTRITION DIAGNOSIS:   Inadequate oral intake related to inability to eat as evidenced by NPO status  GOAL:   Patient will meet greater than or equal to 90% of their needs  MONITOR:   Diet advancement, TF tolerance, Weight trends, Labs, I & O's  REASON FOR ASSESSMENT:   Consult Enteral/tube feeding initiation and management  ASSESSMENT:   76 y.o. male with medical history significant of esophageal cancer, initially diagnosed in May 2017, status post neoadjuvant chemo and radiation therapy followed by surgical resection in October: status post esophagectomy, cervical esophagogastrostomy and pyloroplasty, and J-tube placement. He had been doing well and PET scan as recent as 08/14/16 demonstrated no obvious recurrence other than a single hypermetabolic Adrenal gland nodule, but this has been present and remains unchanged since May of last year. Due to worsening symptoms of fatigue, low grade fevers, chills, weakness he was admitted on 1/25 for apparent hemolytic anemia with HGB drop from 14 to 6.4 which appeared to be c/w DIC.  His symptoms had improved and he was discharged, but then over the course of the past week they worsened again and HGB was down to 7.2 on 2/5.  He was transfused 2 units PRBC yesterday and HGB is 8.1 on 2/6.  Platelets have trended down to 59 today (their lowest).  Consult for TF received. BMI indicates normal weight status. Pt has been NPO since admission d/t GIB and is currently out of the room for EGD; no family/visitors present at this time. Pt seen and discussed during rounds this AM. Pt with hx of esophageal CA dx in May 2017 s/p chemo and radiation and surgical resection in October. During rounds pt reported intermittent vomiting PTA but no vomiting since admission. He has a J-tube and was  receiving TF at home. Pt was seen during admission at the end of January and is followed on an outpatient basis by [facility] RD.   Per notes, pt has been able to consume POs in the past but unsure if this has changed over the past few weeks. He was also receiving Osmolite 1.5 PTA in addition to receiving Osmolite 1.5 @ 75 mL/hr over 16 hours during previous admission; notes indicate poor tolerance in the past of any rate >75 mL/hr.   Unable to perform physical assessment at this time but assessment performed by RD on 08/20/16 showed mild to moderate muscle and mild to moderate fat wasting to all areas; will complete physical assessment at follow-up. Per chart review, weight has been mainly stable (128-135 lbs) since November; will monitor closely.   Medications reviewed; 1 mg oral folic acid/day, PRN Zofran, 40 mg IV Protonix BID, 1000 mcg oral vitamin B12/day.  Labs reviewed; BUN: 21 mg/dL, creatinine: 0.58 mg/dL, Ca: 8.5 mg/dL, Alk Phos/AST elevated.       Diet Order:  Diet NPO time specified  Skin:  Reviewed, no issues  Last BM:  2/6  Height:   Ht Readings from Last 1 Encounters:  08/28/16 5' 5"  (1.651 m)    Weight:   Wt Readings from Last 1 Encounters:  08/28/16 130 lb 4.8 oz (59.1 kg)    Ideal Body Weight:  61.82 kg  BMI:  Body mass index is 21.68 kg/m.  Estimated Nutritional Needs:   Kcal:  1655-1890 (28-32 kcal/kg)  Protein:  70-83 grams (1.2-1.4 grams/kg)  Fluid:  >/= 1.8 L/day  EDUCATION NEEDS:   No education needs identified at this time    Jarome Matin, MS, RD, LDN, [facility] Inpatient Clinical Dietitian Pager # [phone] After hours/weekend pager # [phone]

## 2016-08-29 NOTE — Consult Note (Signed)
Consultation  Referring Provider:  Dr. Wynelle Cleveland    Primary Care Physician:  [name], MD Primary Gastroenterologist:  Dr. Alice Reichert in [facility] diagnosed the esophageal cancer 11/2015; Dr. Jerene Pitch at [facility] did EUS in 2017; he was seen for inpatient consult by Dr. Henrene Pastor 07/2016    Reason for Consultation: Melena             HPI:   Fred [name] is a 76 y.o. Caucasian male with a medical history significant for esophageal cancer, initially diagnosed in May 2017, status post neoadjuvant chemotherapy and radiation therapy followed by surgical resection in October 2017, status post esophagectomy, cervical esophagogastrostomy and pyloroplasty as well as J-tube placement, who presented to the hospital on 08/27/16 for acute blood loss anemia and history of melena.   Apparently per chart review, patient had been doing well and a PET scan as recent as 08/14/16 demonstrated no obvious recurrence, there was a single hypermetabolic adrenal gland nodule which had been present and remained unchanged since May of last year. Due to worsening symptoms of fatigue, low-grade fevers, chills and weakness patient was admitted on 1/25 for hemolytic anemia when hemoglobin dropped from 14-6.4. 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 His symptoms improved and he was discharged, but over the past week he has had worsening symptoms again and his hemoglobin was found to be at 7.2 on 08/26/16. He was transfused 2 units PRBCs on 08/27/16 and his hemoglobin was stable 8.1.   Our service was consulted during last hospital stay with documentation of 1 dark stool on the morning of 08/17/16 as well as heme positive light brown secretions in the anal vault on exam which was possibly from jejunostomy and irrelevant. At time hematology felt that this was hemolysis and DIC with an unclear source but possibly infection. There were no signs of active bleeding and patient was continued on a PPI. Patient's stool then cleared in color and there were  no further plans for an EGD unless he developed overt bleeding.     Today, the patient is laying in bed with his daughter by his bedside who does assist this history. She explains that since the time that he was discharged at the end of January he has been having dark stools that are "black tarry sticky". She tells me that he had fluid drawn off of his lungs last Thursday and 2 days later he "couldn't walk and was very weak". He continued with black stools and was admitted a few days ago for a decreased hemoglobin again. The patient has not eaten anything over the past 24-48 hours. He tells me that he has no abdominal pain but did have a black tarry stool as recently as yesterday morning. He has been very low energy and weak since leaving the hospital. His daughter believes these symptoms are always brought on when they "draw fluid off of his lungs", she wonders if there is correlation. Patient tells me that nothing has changed with his appetite, he is only able to very small amounts of food at a time ever since his surgery last year. He does tell me that he was not on a PPI after leaving the hospital last time. He denies use of NSAIDs. Patient does tell me that since time of surgery he has had some occasional nausea with vomiting if he eats too much, but this has not changed recently.   Patient denies fever, chills, shortness of breath, heartburn or reflux.  Past GI history: 11/23/15-EGD,  Dr. Alice Reichert: Finding of a 6 mm mass and otherwise normal EGD  Past Medical History:  Diagnosis Date  . Esophageal cancer ([place]) 11/23/15   lower 3rd esohagus   . GERD (gastroesophageal reflux disease)   . Hyperlipidemia   . Hypertension     Past Surgical History:  Procedure Laterality Date  . CHEST TUBE INSERTION Left 04/23/2016   Procedure: CHEST TUBE INSERTION;  Surgeon: [name], MD;  Location: [place];  Service: Thoracic;  Laterality: Left;  . COMPLETE ESOPHAGECTOMY N/A 04/23/2016   Procedure: TRANSHIATIAL  TOTAL ESOPHAGECTOMY COMPLETE; CERVICAL ESOPHAGOGASTROSTOMY AND PYLOROPLASTY;  Surgeon: [name], MD;  Location: [place];  Service: Thoracic;  Laterality: N/A;  . cystoscope  4/12/1   prostate  . ERCP    . INGUINAL HERNIA REPAIR    . JEJUNOSTOMY N/A 04/23/2016   Procedure: FEEDING JEJUNOSTOMY;  Surgeon: [name], MD;  Location: [place];  Service: Thoracic;  Laterality: N/A;  . LEG SURGERY Left    hole  . PROSTATE BIOPSY  10/05/15  . right shoulder surgery    . VIDEO BRONCHOSCOPY N/A 04/23/2016   Procedure: VIDEO BRONCHOSCOPY;  Surgeon: [name], MD;  Location: [facility] OR;  Service: Thoracic;  Laterality: N/A;    Family History  Problem Relation Age of Onset  . Cancer Maternal Grandfather 47    gastric cancer   . Alzheimer's disease Mother   . Diabetes Mother   . Stroke Father 7  . Multiple sclerosis Sister     Social History  Substance Use Topics  . Smoking status: Former Smoker    Packs/day: 1.00    Years: 10.00    Quit date: 07/24/1967  . Smokeless tobacco: Never Used  . Alcohol use No    Prior to Admission medications   Medication Sig Start Date End Date Taking? Authorizing Provider  acetaminophen (TYLENOL) 325 MG tablet Take 650 mg by mouth every 6 (six) hours as needed.   Yes Historical Provider, MD  atenolol (TENORMIN) 25 MG tablet Take 1 tab in the morning and half tab in the evening Patient taking differently: Take 12.5-25 mg by mouth 2 (two) times daily. Take 51m in the morning and 12.565min the evening 05/24/16  Yes Rhonda G Barrett, PA-C  folic acid (FOLVITE) 1 MG tablet Take 1 tablet (1 mg total) by mouth daily. 08/23/16  Yes Jennifer Chahn-Yang Choi, DO  mirtazapine (REMERON) 15 MG tablet Take 1 tablet (15 mg total) by mouth at bedtime. 08/15/16  Yes YaTruitt MerleMD  Nutritional Supplements (FEEDING SUPPLEMENT, OSMOLITE 1.5 CAL,) LIQD Increase Osmolite 1.5 to 75 mL/hr for 16 hours daily with 140 mL water before and after continuous feeding. Flush or drink  additional 600 mL free water daily. Send formula and bags. 08/13/16  Yes YaTruitt MerleMD  ondansetron (ZOFRAN ODT) 8 MG disintegrating tablet Take 1 tablet (8 mg total) by mouth every 8 (eight) hours as needed for nausea or vomiting. 08/15/16  Yes YaTruitt MerleMD  oxycodone (OXY-IR) 5 MG capsule Take 5 mg by mouth every 6 (six) hours as needed for pain.    Yes Historical Provider, MD  vitamin B-12 1000 MCG tablet Take 1 tablet (1,000 mcg total) by mouth daily. 08/23/16  Yes JeShon MilletDO    Current Facility-Administered Medications  Medication Dose Route Frequency Provider Last Rate Last Dose  . acetaminophen (TYLENOL) tablet 650 mg  650 mg Oral Q6H PRN JaEtta QuillDO      .  atenolol (TENORMIN) tablet 12.5 mg  12.5 mg Oral QHS Reyne Dumas, MD   12.5 mg at 08/28/16 2306  . atenolol (TENORMIN) tablet 25 mg  25 mg Oral Daily Etta Quill, DO      . folic acid (FOLVITE) tablet 1 mg  1 mg Oral Daily Etta Quill, DO      . mirtazapine (REMERON) tablet 15 mg  15 mg Oral QHS Etta Quill, DO   15 mg at 08/28/16 2306  . ondansetron (ZOFRAN) injection 4 mg  4 mg Intravenous Q6H PRN Etta Quill, DO      . ondansetron (ZOFRAN-ODT) disintegrating tablet 8 mg  8 mg Oral Q8H PRN Etta Quill, DO      . oxyCODONE (Oxy IR/ROXICODONE) immediate release tablet 5 mg  5 mg Oral Q6H PRN Etta Quill, DO      . pantoprazole (PROTONIX) injection 40 mg  40 mg Intravenous Q12H Etta Quill, DO   40 mg at 08/28/16 2359  . vitamin B-12 (CYANOCOBALAMIN) tablet 1,000 mcg  1,000 mcg Oral Daily Etta Quill, DO        Allergies as of 08/28/2016 - Review Complete 08/28/2016  Allergen Reaction Noted  . No known allergies  04/20/2016     Review of Systems:    Constitutional: Positive for weakness and fatigue Skin: No rash  Cardiovascular: No chest pain Respiratory: No cough Gastrointestinal: See HPI and otherwise negative Genitourinary: No dysuria or change in urinary  frequency Neurological: No headache Musculoskeletal: No new muscle or joint pain Hematologic: No bruising Psychiatric: No history of depression or anxiety   Physical Exam:  Vital signs in last 24 hours: Temp:  [99.2 F (37.3 C)-99.4 F (37.4 C)] 99.4 F (37.4 C) (02/07 0449) Pulse Rate:  [82-91] 91 (02/07 0449) Resp:  [18] 18 (02/07 0449) BP: (108-128)/(60-66) 108/61 (02/07 0449) SpO2:  [97 %-100 %] 97 % (02/07 0449) Weight:  [130 lb 4.8 oz (59.1 kg)] 130 lb 4.8 oz (59.1 kg) (02/06 1840) Last BM Date: 08/28/16 General:   Pleasant ill appearing Caucasian male appears to be in NAD, Well developed, Well nourished, alert and cooperative Head:  Normocephalic and atraumatic. Eyes:   PEERL, EOMI. No icterus. Conjunctiva pink. Ears:  Normal auditory acuity. Neck:  Supple Throat: Oral cavity and pharynx without inflammation, swelling or lesion. Teeth in good condition. Lungs: Respirations even and unlabored. Lungs clear to auscultation bilaterally.   No wheezes, crackles, or rhonchi.  Heart: Normal S1, S2. No MRG. Regular rate and rhythm. No peripheral edema, cyanosis or pallor.  Abdomen:  Soft, nondistended, nontender. No rebound or guarding. Normal bowel sounds. No appreciable masses or hepatomegaly. Rectal:  Not performed.  Msk:  Symmetrical without gross deformities. Peripheral pulses intact.  Extremities:  Without edema, no deformity or joint abnormality. Neurologic:  Alert and  oriented x4;  grossly normal neurologically. Skin:   Dry and intact without significant lesions or rashes. Psychiatric: Demonstrates good judgement and reason without abnormal affect or behaviors.   LAB RESULTS:  Recent Labs  08/27/16 0912 08/28/16 1950  WBC 13.1* 6.7  HGB 7.2* 8.1*  HCT 21.6* 23.9*  PLT 84* 52*  59*   BMET  Recent Labs  08/27/16 0912 08/28/16 1950  NA 133* 136  K 4.7 4.7  CL  --  101  CO2 23 28  GLUCOSE 190* 114*  BUN 24.2 21*  CREATININE 0.7 0.58*  CALCIUM 9.0 8.5*    LFT  Recent Labs  08/27/16 0912  PROT 6.1*  ALBUMIN 3.3*  AST 105*  ALT 37  ALKPHOS 520*  BILITOT 1.61*   PT/INR  Recent Labs  08/28/16 1950  LABPROT 17.5*  INR 1.42    STUDIES: Dg Chest Port 1 View  Result Date: 08/28/2016 CLINICAL DATA:  Pleural effusions. Previous smoker. History of esophageal cancer and hypertension. EXAM: PORTABLE CHEST 1 VIEW COMPARISON:  08/22/2016 FINDINGS: Small left pleural effusion is increasing in size since previous study. Atelectasis in the left lung base. Right lung is clear. Emphysematous changes bilaterally. Heart size and pulmonary vascularity are normal. Degenerative changes in the spine and shoulders. IMPRESSION: Small but increasing left pleural effusion and basilar atelectasis. Emphysematous changes in the lungs. Electronically Signed   By: Lucienne Capers M.D.   On: 08/28/2016 22:50     PREVIOUS ENDOSCOPIES:            See HPI   Impression / Plan:   Impression: 1. Melena: Patient reports ever since admission at the end of January, last stool yesterday morning, "black tarry sticky", no accompanying abdominal pain, heartburn or reflux, patient has not been on a PPI since discharge in January or before that time, but was on one in the hospital per his report; consider anastomotic ulcer versus PUD versus other 2. Symptomatic anemia: Likely related to blood loss from above, though question of hemolysis playing a role 3. DIC: Unclear underlying cause, PET scan negative on 1/23 for obvious cancer recurrence, Dr. Annamaria Boots is following and planning possible bone marrow biopsy 4. History of esophageal carcinoma status post chemotherapy, radiation and surgery 5. Pleural effusion: Drained during last hospital stay 6. 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 Scheduled patient for EGD this afternoon, around 1:00 with Dr. Ardis Hughs. Discussed risks, benefits, limitations and alternatives and the patient agrees to proceed. 2. Continue supportive measures  including PPI 3. Continue to monitor hemoglobin, transfusion less than 7 4. Patient to remain nothing by mouth until after time procedure today 5. Please await any further recommendations from Dr. Ardis Hughs after time of procedure today  Thank you for your kind consultation, we will continue to follow.  Lavone Nian Lemmon  08/29/2016, 10:13 AM Pager #: [phone]   ________________________________________________________________________  Velora Heckler GI MD note:  I personally examined the patient, reviewed the data and agree with the assessment and plan described above.  Planning on EGD today.     Owens Loffler, MD [facility] Gastroenterology Pager [phone]

## 2016-08-29 NOTE — Op Note (Signed)
[facility] Patient Name: Fred [name] Procedure Date: 08/29/2016 MRN: [id number] Attending MD: Milus Banister , MD Date of Birth: 1940-08-07 CSN: [id number] Age: 76 Admit Type: Inpatient Procedure:                Upper GI endoscopy Indications:              Melena; s/p esophagectomy gastric pull up 2017 for                            esophageal cancer that was diagnosed by Dr. Alice Reichert                            in [facility]; now with melena, DIC Providers:                Milus Banister, MD, Carolynn Comment, RN, Elspeth Cho Tech., Technician, Heide Scales, CRNA Referring MD:             Truitt Merle, MD Medicines:                Monitored Anesthesia Care Complications:            No immediate complications. Estimated blood loss:                            None. Estimated Blood Loss:     Estimated blood loss: none. Procedure:                Pre-Anesthesia Assessment:                           - Prior to the procedure, a History and Physical                            was performed, and patient medications and                            allergies were reviewed. The patient's tolerance of                            previous anesthesia was also reviewed. The risks                            and benefits of the procedure and the sedation                            options and risks were discussed with the patient.                            All questions were answered, and informed consent                            was obtained. Prior Anticoagulants: The patient has  taken no previous anticoagulant or antiplatelet                            agents. ASA Grade Assessment: III - A patient with                            severe systemic disease. After reviewing the risks                            and benefits, the patient was deemed in                            satisfactory condition to undergo the procedure.        After obtaining informed consent, the endoscope was                            passed under direct vision. Throughout the                            procedure, the patient's blood pressure, pulse, and                            oxygen saturations were monitored continuously. The                            was introduced through the mouth, and advanced to                            the proximal jejunum. The upper GI endoscopy was                            accomplished without difficulty. The patient                            tolerated the procedure well. Scope In: Scope Out: Findings:      The high EG anastomosis was normal appearing (here is 2-4cm of esophagus       proximal to the anastomosis).      The stomach was normal.      The duodenum was normal.      The proximal jejunum was normal, including good views of the site of J       tube insertion (no ulcers).      There was no blood in the UGI tract Impression:               - Normal post esophagectomy/gastric pull up anatomy                            without any old or fresh blood in the UGI tract Moderate Sedation:      N/A- Per Anesthesia Care Recommendation:           - Per discussion with Dr. Burr Medico yesterday, will                            proceed with colonoscoy  evaluation tomorrow to                            continue workup of melena in setting of DIC.                           - Clear liquids today. Procedure Code(s):        --- Professional ---                           [phone], Esophagogastroduodenoscopy, flexible,                            transoral; diagnostic, including collection of                            specimen(s) by brushing or washing, when performed                            (separate procedure) Diagnosis Code(s):        --- Professional ---                           K92.1, Melena (includes Hematochezia) CPT copyright 2016 [employer] Association. All rights reserved. The codes documented in  this report are preliminary and upon coder review may  be revised to meet current compliance requirements. Milus Banister, MD 08/29/2016 2:11:18 PM This report has been signed electronically. Number of Addenda: 0

## 2016-08-29 NOTE — Anesthesia Preprocedure Evaluation (Signed)
Anesthesia Evaluation  Patient identified by MRN, date of birth, ID band Patient awake    Reviewed: Allergy & Precautions, NPO status , Patient's Chart, lab work & pertinent test results  Airway Mallampati: II  TM Distance: >3 FB Neck ROM: Full    Dental no notable dental hx.    Pulmonary former smoker,    Pulmonary exam normal breath sounds clear to auscultation       Cardiovascular hypertension, Pt. on medications and Pt. on home beta blockers Normal cardiovascular exam Rhythm:Regular Rate:Normal     Neuro/Psych negative neurological ROS  negative psych ROS   GI/Hepatic negative GI ROS, Neg liver ROS,   Endo/Other  negative endocrine ROS  Renal/GU negative Renal ROS  negative genitourinary   Musculoskeletal negative musculoskeletal ROS (+)   Abdominal   Peds negative pediatric ROS (+)  Hematology negative hematology ROS (+)   Anesthesia Other Findings   Reproductive/Obstetrics negative OB ROS                             Anesthesia Physical Anesthesia Plan  ASA: II  Anesthesia Plan: MAC   Post-op Pain Management:    Induction:   Airway Management Planned: Nasal Cannula  Additional Equipment:   Intra-op Plan:   Post-operative Plan:   Informed Consent: I have reviewed the patients History and Physical, chart, labs and discussed the procedure including the risks, benefits and alternatives for the proposed anesthesia with the patient or authorized representative who has indicated his/her understanding and acceptance.   Dental advisory given  Plan Discussed with: CRNA  Anesthesia Plan Comments:         Anesthesia Quick Evaluation

## 2016-08-29 NOTE — Progress Notes (Addendum)
PROGRESS NOTE    Fred [name]   [id number]  DOB: November 18, 1940  DOA: 08/28/2016 PCP: [name], MD   Brief Narrative:  Fred [name] is a 76 y.o. male with medical history significant of esophageal cancer, initially diagnosed in May 2017, status post neoadjuvant chemo and radiation therapy followed by surgical resection in October, status post esophagectomy, cervical esophagogastrostomy, pyloroplasty and J-tube placement. He has had issues with recurrent L sided pleural effusions, DIC and melena. He was last admitted in 1/25, given supportive care with a thoracentesis and blood transfusion. Dr Burr Medico has been following DIC and has requested a direct admit for melena, anemia and thrombocytopenia. GI called for EGD.   Subjective: No complaints today.  Assessment & Plan:   Principal Problem:   Melena - GI called for EGD which is to be performed today - cont to follow  Active Problems: Anemia - due to acute blood loss and DIC - given 2 U PRBC on 2/5- Hb back down to 7.7 - Dr Burr Medico following  Elevated LFTs - Alk phos (500), AST (105), T bili elevated (1.61) - check hepatitis panel and abdominal ultrasound  DIC - Hb 7.7 and plt 58,  D Dimer > 20, LDH 1404 - Dr Burr Medico following- cause uncertain- needs bone marrow biopsy    Malignant neoplasm of lower third of esophagus   - s/p surgery, chemo, radiation    Pleural effusion - recurrent L sided effusions suspected to be due to history of esophageal cancer  - had had 3 thoracentesis  - currently effusion is small- will follow closely - follows with Dr Servando Snare   DVT prophylaxis: SCDs Code Status: Full code Family Communication: daughter Disposition Plan:  Consultants:   GI  oncology Procedures:   EGD pending Antimicrobials:  Anti-infectives    None       Objective: Vitals:   08/28/16 1830 08/28/16 1840 08/28/16 2300 08/29/16 0449  BP: 128/66  110/60 108/61  Pulse: 82  88 91  Resp: _0 Temp: 99.2  F (37.3 C)  99.3 F (37.4 C) 99.4 F (37.4 C)  TempSrc: Oral  Oral Oral  SpO2: 100%  97% 97%  Weight:  59.1 kg (130 lb 4.8 oz)    Height:  _1  (1.651 m)      Intake/Output Summary (Last 24 hours) at 08/29/16 1207 Last data filed at 08/29/16 0453  Gross per 24 hour  Intake            487.5 ml  Output              500 ml  Net            -12.5 ml   Filed Weights   08/28/16 1840  Weight: 59.1 kg (130 lb 4.8 oz)    Examination: General exam: Appears comfortable  HEENT: PERRLA, oral mucosa moist, no sclera icterus or thrush Respiratory system: Clear to auscultation. Respiratory effort normal. Cardiovascular system: S1 & S2 heard, RRR.  No murmurs  Gastrointestinal system: Abdomen soft, non-tender, nondistended. Normal bowel sound. No organomegaly Central nervous system: Alert and oriented. No focal neurological deficits. Extremities: No cyanosis, clubbing or edema Skin: No rashes or ulcers Psychiatry:  Mood & affect appropriate.     Data Reviewed: I have personally reviewed following labs and imaging studies  CBC:  Recent Labs Lab 08/23/16 0439 08/27/16 0912 08/28/16 1950 08/29/16 0539  WBC 9.9 13.1* 6.7 6.1  NEUTROABS 7.1 9.1*  --   --  HGB 8.7* 7.2* 8.1* 7.7*  HCT 25.9* 21.6* 23.9* 22.4*  MCV 86.9 92.3 86.0 89.6  PLT 106* 84* 52*  59* 58*   Basic Metabolic Panel:  Recent Labs Lab 08/23/16 0439 08/27/16 0912 08/28/16 1950  NA 135 133* 136  K 4.3 4.7 4.7  CL 98*  --  101  CO2 _0 GLUCOSE 168* 190* 114*  BUN 27* 24.2 21*  CREATININE 0.53* 0.7 0.58*  CALCIUM 8.5* 9.0 8.5*   GFR: Estimated Creatinine Clearance: 65.7 mL/min (by C-G formula based on SCr of 0.58 mg/dL (L)). Liver Function Tests:  Recent Labs Lab 08/27/16 0912  AST 105*  ALT 37  ALKPHOS 520*  BILITOT 1.61*  PROT 6.1*  ALBUMIN 3.3*   No results for input(s): LIPASE, AMYLASE in the last 168 hours. No results for input(s): AMMONIA in the last 168 hours. Coagulation  Profile:  Recent Labs Lab 08/28/16 1950  INR 1.42   Cardiac Enzymes: No results for input(s): CKTOTAL, CKMB, CKMBINDEX, TROPONINI in the last 168 hours. BNP (last 3 results) No results for input(s): PROBNP in the last 8760 hours. HbA1C: No results for input(s): HGBA1C in the last 72 hours. CBG:  Recent Labs Lab 08/22/16 1557 08/22/16 2006 08/23/16 0013 08/23/16 0414  GLUCAP 134* 109* 182* 189*   Lipid Profile: No results for input(s): CHOL, HDL, LDLCALC, TRIG, CHOLHDL, LDLDIRECT in the last 72 hours. Thyroid Function Tests: No results for input(s): TSH, T4TOTAL, FREET4, T3FREE, THYROIDAB in the last 72 hours. Anemia Panel:  Recent Labs  08/27/16 0912  RETICCTPCT 13.79*   Urine analysis:    Component Value Date/Time   COLORURINE AMBER (A) 08/16/2016 0429   APPEARANCEUR CLEAR 08/16/2016 0429   LABSPEC 1.025 08/16/2016 0429   LABSPEC 1.010 08/15/2016 1224   PHURINE 5.0 08/16/2016 0429   GLUCOSEU NEGATIVE 08/16/2016 0429   GLUCOSEU Negative 08/15/2016 1224   HGBUR NEGATIVE 08/16/2016 0429   BILIRUBINUR NEGATIVE 08/16/2016 0429   BILIRUBINUR Negative 08/15/2016 1224   KETONESUR NEGATIVE 08/16/2016 0429   PROTEINUR 30 (A) 08/16/2016 0429   UROBILINOGEN 0.2 08/15/2016 1224   NITRITE NEGATIVE 08/16/2016 0429   LEUKOCYTESUR NEGATIVE 08/16/2016 0429   LEUKOCYTESUR Negative 08/15/2016 1224   Sepsis Labs: _1 (procalcitonin:4,lacticidven:4) ) Recent Results (from the past 240 hour(s))  Culture, body fluid-bottle     Status: None   Collection Time: 08/22/16 11:22 AM  Result Value Ref Range Status   Specimen Description PLEURAL LEFT  Final   Special Requests NONE  Final   Culture   Final    NO GROWTH 5 DAYS Performed at [facility] [address], [place], [place] [address]    Report Status 08/27/2016 FINAL  Final  Gram stain     Status: None   Collection Time: 08/22/16 11:22 AM  Result Value Ref Range Status   Specimen Description FLUID LEFT  PLEURAL  Final   Special Requests NONE  Final   Gram Stain   Final    RARE WBC PRESENT, PREDOMINANTLY MONONUCLEAR NO ORGANISMS SEEN Performed at [facility], [place] [address], [place], [place] [address]    Report Status 08/23/2016 FINAL  Final  TECHNOLOGIST REVIEW     Status: None   Collection Time: 08/27/16  9:12 AM  Result Value Ref Range Status   Technologist Review   Final    Metas and Myelocytes present, 5% nRBCs, moderate helmets, few spherocytes, sl basophilic stippling         Radiology Studies: Dg  Chest Port 1 View  Result Date: 08/28/2016 CLINICAL DATA:  Pleural effusions. Previous smoker. History of esophageal cancer and hypertension. EXAM: PORTABLE CHEST 1 VIEW COMPARISON:  08/22/2016 FINDINGS: Small left pleural effusion is increasing in size since previous study. Atelectasis in the left lung base. Right lung is clear. Emphysematous changes bilaterally. Heart size and pulmonary vascularity are normal. Degenerative changes in the spine and shoulders. IMPRESSION: Small but increasing left pleural effusion and basilar atelectasis. Emphysematous changes in the lungs. Electronically Signed   By: Lucienne Capers M.D.   On: 08/28/2016 22:50      Scheduled Meds: . atenolol  12.5 mg Oral QHS  . atenolol  25 mg Oral Daily  . folic acid  1 mg Oral Daily  . mirtazapine  15 mg Oral QHS  . pantoprazole (PROTONIX) IV  40 mg Intravenous Q12H  . vitamin B-12  1,000 mcg Oral Daily   Continuous Infusions:   LOS: 1 day    Time spent in minutes: 67    [place], MD [facility] Pager: [email or web address] Password [email or web address] 08/29/2016, 12:07 PM

## 2016-08-29 NOTE — Anesthesia Postprocedure Evaluation (Signed)
Anesthesia Post Note  Patient: Fred [name]  Procedure(s) Performed: Procedure(s) (LRB): ESOPHAGOGASTRODUODENOSCOPY (EGD) WITH PROPOFOL (N/A)  Patient location during evaluation: Endoscopy Anesthesia Type: MAC Level of consciousness: awake and alert Pain management: pain level controlled Vital Signs Assessment: post-procedure vital signs reviewed and stable Respiratory status: spontaneous breathing, nonlabored ventilation, respiratory function stable and patient connected to nasal cannula oxygen Cardiovascular status: stable and blood pressure returned to baseline Anesthetic complications: no       Last Vitals:  Vitals:   08/29/16 1425 08/29/16 1430  BP: 102/62 (!) 109/92  Pulse: 83 80  Resp: 16 (!) 24  Temp:      Last Pain:  Vitals:   08/29/16 1404  TempSrc: Oral  PainSc:                  [name]

## 2016-08-29 NOTE — Transfer of Care (Signed)
Immediate Anesthesia Transfer of Care Note  Patient: AQIB LOUGH  Procedure(s) Performed: Procedure(s): ESOPHAGOGASTRODUODENOSCOPY (EGD) WITH PROPOFOL (N/A)  Patient Location: PACU  Anesthesia Type:MAC  Level of Consciousness: Patient easily awoken, sedated, comfortable, cooperative, following commands, responds to stimulation.   Airway & Oxygen Therapy: Patient spontaneously breathing, ventilating well, oxygen via simple oxygen mask.  Post-op Assessment: Report given to PACU RN, vital signs reviewed and stable, moving all extremities.   Post vital signs: Reviewed and stable.  Complications: No apparent anesthesia complications Last Vitals:  Vitals:   08/29/16 0449 08/29/16 1217  BP: 108/61 (!) 112/47  Pulse: 91   Resp: 18 (!) 22  Temp: 37.4 C 37 C    Last Pain:  Vitals:   08/29/16 1217  TempSrc: Oral  PainSc:          Complications: No apparent anesthesia complications

## 2016-08-29 NOTE — H&P (View-Only) (Signed)
Consultation  Referring Provider:  Dr. Wynelle Cleveland    Primary Care Physician:  [name], MD Primary Gastroenterologist:  Dr. Alice Reichert in [facility] diagnosed the esophageal cancer 11/2015; Dr. Jerene Pitch at [facility] did EUS in 2017; he was seen for inpatient consult by Dr. Henrene Pastor 07/2016    Reason for Consultation: Melena             HPI:   Fred [name] is a 76 y.o. Caucasian male with a medical history significant for esophageal cancer, initially diagnosed in May 2017, status post neoadjuvant chemotherapy and radiation therapy followed by surgical resection in October 2017, status post esophagectomy, cervical esophagogastrostomy and pyloroplasty as well as J-tube placement, who presented to the hospital on 08/27/16 for acute blood loss anemia and history of melena.   Apparently per chart review, patient had been doing well and a PET scan as recent as 08/14/16 demonstrated no obvious recurrence, there was a single hypermetabolic adrenal gland nodule which had been present and remained unchanged since May of last year. Due to worsening symptoms of fatigue, low-grade fevers, chills and weakness patient was admitted on 1/25 for hemolytic anemia when hemoglobin dropped from 14-6.4. 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 His symptoms improved and he was discharged, but over the past week he has had worsening symptoms again and his hemoglobin was found to be at 7.2 on 08/26/16. He was transfused 2 units PRBCs on 08/27/16 and his hemoglobin was stable 8.1.   Our service was consulted during last hospital stay with documentation of 1 dark stool on the morning of 08/17/16 as well as heme positive light brown secretions in the anal vault on exam which was possibly from jejunostomy and irrelevant. At time hematology felt that this was hemolysis and DIC with an unclear source but possibly infection. There were no signs of active bleeding and patient was continued on a PPI. Patient's stool then cleared in color and there were  no further plans for an EGD unless he developed overt bleeding.     Today, the patient is laying in bed with his daughter by his bedside who does assist this history. She explains that since the time that he was discharged at the end of January he has been having dark stools that are "black tarry sticky". She tells me that he had fluid drawn off of his lungs last Thursday and 2 days later he "couldn't walk and was very weak". He continued with black stools and was admitted a few days ago for a decreased hemoglobin again. The patient has not eaten anything over the past 24-48 hours. He tells me that he has no abdominal pain but did have a black tarry stool as recently as yesterday morning. He has been very low energy and weak since leaving the hospital. His daughter believes these symptoms are always brought on when they "draw fluid off of his lungs", she wonders if there is correlation. Patient tells me that nothing has changed with his appetite, he is only able to very small amounts of food at a time ever since his surgery last year. He does tell me that he was not on a PPI after leaving the hospital last time. He denies use of NSAIDs. Patient does tell me that since time of surgery he has had some occasional nausea with vomiting if he eats too much, but this has not changed recently.   Patient denies fever, chills, shortness of breath, heartburn or reflux.  Past GI history: 11/23/15-EGD,  Dr. Alice Reichert: Finding of a 6 mm mass and otherwise normal EGD  Past Medical History:  Diagnosis Date  . Esophageal cancer ([place]) 11/23/15   lower 3rd esohagus   . GERD (gastroesophageal reflux disease)   . Hyperlipidemia   . Hypertension     Past Surgical History:  Procedure Laterality Date  . CHEST TUBE INSERTION Left 04/23/2016   Procedure: CHEST TUBE INSERTION;  Surgeon: [name], MD;  Location: [place];  Service: Thoracic;  Laterality: Left;  . COMPLETE ESOPHAGECTOMY N/A 04/23/2016   Procedure: TRANSHIATIAL  TOTAL ESOPHAGECTOMY COMPLETE; CERVICAL ESOPHAGOGASTROSTOMY AND PYLOROPLASTY;  Surgeon: [name], MD;  Location: [place];  Service: Thoracic;  Laterality: N/A;  . cystoscope  4/12/1   prostate  . ERCP    . INGUINAL HERNIA REPAIR    . JEJUNOSTOMY N/A 04/23/2016   Procedure: FEEDING JEJUNOSTOMY;  Surgeon: [name], MD;  Location: [place];  Service: Thoracic;  Laterality: N/A;  . LEG SURGERY Left    hole  . PROSTATE BIOPSY  10/05/15  . right shoulder surgery    . VIDEO BRONCHOSCOPY N/A 04/23/2016   Procedure: VIDEO BRONCHOSCOPY;  Surgeon: [name], MD;  Location: [facility] OR;  Service: Thoracic;  Laterality: N/A;    Family History  Problem Relation Age of Onset  . Cancer Maternal Grandfather 29    gastric cancer   . Alzheimer's disease Mother   . Diabetes Mother   . Stroke Father 37  . Multiple sclerosis Sister     Social History  Substance Use Topics  . Smoking status: Former Smoker    Packs/day: 1.00    Years: 10.00    Quit date: 07/24/1967  . Smokeless tobacco: Never Used  . Alcohol use No    Prior to Admission medications   Medication Sig Start Date End Date Taking? Authorizing Provider  acetaminophen (TYLENOL) 325 MG tablet Take 650 mg by mouth every 6 (six) hours as needed.   Yes Historical Provider, MD  atenolol (TENORMIN) 25 MG tablet Take 1 tab in the morning and half tab in the evening Patient taking differently: Take 12.5-25 mg by mouth 2 (two) times daily. Take 72m in the morning and 12.524min the evening 05/24/16  Yes Rhonda G Barrett, PA-C  folic acid (FOLVITE) 1 MG tablet Take 1 tablet (1 mg total) by mouth daily. 08/23/16  Yes Jennifer Chahn-Yang Choi, DO  mirtazapine (REMERON) 15 MG tablet Take 1 tablet (15 mg total) by mouth at bedtime. 08/15/16  Yes YaTruitt MerleMD  Nutritional Supplements (FEEDING SUPPLEMENT, OSMOLITE 1.5 CAL,) LIQD Increase Osmolite 1.5 to 75 mL/hr for 16 hours daily with 140 mL water before and after continuous feeding. Flush or drink  additional 600 mL free water daily. Send formula and bags. 08/13/16  Yes YaTruitt MerleMD  ondansetron (ZOFRAN ODT) 8 MG disintegrating tablet Take 1 tablet (8 mg total) by mouth every 8 (eight) hours as needed for nausea or vomiting. 08/15/16  Yes YaTruitt MerleMD  oxycodone (OXY-IR) 5 MG capsule Take 5 mg by mouth every 6 (six) hours as needed for pain.    Yes Historical Provider, MD  vitamin B-12 1000 MCG tablet Take 1 tablet (1,000 mcg total) by mouth daily. 08/23/16  Yes JeShon MilletDO    Current Facility-Administered Medications  Medication Dose Route Frequency Provider Last Rate Last Dose  . acetaminophen (TYLENOL) tablet 650 mg  650 mg Oral Q6H PRN JaEtta QuillDO      .  atenolol (TENORMIN) tablet 12.5 mg  12.5 mg Oral QHS Reyne Dumas, MD   12.5 mg at 08/28/16 2306  . atenolol (TENORMIN) tablet 25 mg  25 mg Oral Daily Etta Quill, DO      . folic acid (FOLVITE) tablet 1 mg  1 mg Oral Daily Etta Quill, DO      . mirtazapine (REMERON) tablet 15 mg  15 mg Oral QHS Etta Quill, DO   15 mg at 08/28/16 2306  . ondansetron (ZOFRAN) injection 4 mg  4 mg Intravenous Q6H PRN Etta Quill, DO      . ondansetron (ZOFRAN-ODT) disintegrating tablet 8 mg  8 mg Oral Q8H PRN Etta Quill, DO      . oxyCODONE (Oxy IR/ROXICODONE) immediate release tablet 5 mg  5 mg Oral Q6H PRN Etta Quill, DO      . pantoprazole (PROTONIX) injection 40 mg  40 mg Intravenous Q12H Etta Quill, DO   40 mg at 08/28/16 2359  . vitamin B-12 (CYANOCOBALAMIN) tablet 1,000 mcg  1,000 mcg Oral Daily Etta Quill, DO        Allergies as of 08/28/2016 - Review Complete 08/28/2016  Allergen Reaction Noted  . No known allergies  04/20/2016     Review of Systems:    Constitutional: Positive for weakness and fatigue Skin: No rash  Cardiovascular: No chest pain Respiratory: No cough Gastrointestinal: See HPI and otherwise negative Genitourinary: No dysuria or change in urinary  frequency Neurological: No headache Musculoskeletal: No new muscle or joint pain Hematologic: No bruising Psychiatric: No history of depression or anxiety   Physical Exam:  Vital signs in last 24 hours: Temp:  [99.2 F (37.3 C)-99.4 F (37.4 C)] 99.4 F (37.4 C) (02/07 0449) Pulse Rate:  [82-91] 91 (02/07 0449) Resp:  [18] 18 (02/07 0449) BP: (108-128)/(60-66) 108/61 (02/07 0449) SpO2:  [97 %-100 %] 97 % (02/07 0449) Weight:  [130 lb 4.8 oz (59.1 kg)] 130 lb 4.8 oz (59.1 kg) (02/06 1840) Last BM Date: 08/28/16 General:   Pleasant ill appearing Caucasian male appears to be in NAD, Well developed, Well nourished, alert and cooperative Head:  Normocephalic and atraumatic. Eyes:   PEERL, EOMI. No icterus. Conjunctiva pink. Ears:  Normal auditory acuity. Neck:  Supple Throat: Oral cavity and pharynx without inflammation, swelling or lesion. Teeth in good condition. Lungs: Respirations even and unlabored. Lungs clear to auscultation bilaterally.   No wheezes, crackles, or rhonchi.  Heart: Normal S1, S2. No MRG. Regular rate and rhythm. No peripheral edema, cyanosis or pallor.  Abdomen:  Soft, nondistended, nontender. No rebound or guarding. Normal bowel sounds. No appreciable masses or hepatomegaly. Rectal:  Not performed.  Msk:  Symmetrical without gross deformities. Peripheral pulses intact.  Extremities:  Without edema, no deformity or joint abnormality. Neurologic:  Alert and  oriented x4;  grossly normal neurologically. Skin:   Dry and intact without significant lesions or rashes. Psychiatric: Demonstrates good judgement and reason without abnormal affect or behaviors.   LAB RESULTS:  Recent Labs  08/27/16 0912 08/28/16 1950  WBC 13.1* 6.7  HGB 7.2* 8.1*  HCT 21.6* 23.9*  PLT 84* 52*  59*   BMET  Recent Labs  08/27/16 0912 08/28/16 1950  NA 133* 136  K 4.7 4.7  CL  --  101  CO2 23 28  GLUCOSE 190* 114*  BUN 24.2 21*  CREATININE 0.7 0.58*  CALCIUM 9.0 8.5*    LFT  Recent Labs  08/27/16 0912  PROT 6.1*  ALBUMIN 3.3*  AST 105*  ALT 37  ALKPHOS 520*  BILITOT 1.61*   PT/INR  Recent Labs  08/28/16 1950  LABPROT 17.5*  INR 1.42    STUDIES: Dg Chest Port 1 View  Result Date: 08/28/2016 CLINICAL DATA:  Pleural effusions. Previous smoker. History of esophageal cancer and hypertension. EXAM: PORTABLE CHEST 1 VIEW COMPARISON:  08/22/2016 FINDINGS: Small left pleural effusion is increasing in size since previous study. Atelectasis in the left lung base. Right lung is clear. Emphysematous changes bilaterally. Heart size and pulmonary vascularity are normal. Degenerative changes in the spine and shoulders. IMPRESSION: Small but increasing left pleural effusion and basilar atelectasis. Emphysematous changes in the lungs. Electronically Signed   By: Lucienne Capers M.D.   On: 08/28/2016 22:50     PREVIOUS ENDOSCOPIES:            See HPI   Impression / Plan:   Impression: 1. Melena: Patient reports ever since admission at the end of January, last stool yesterday morning, "black tarry sticky", no accompanying abdominal pain, heartburn or reflux, patient has not been on a PPI since discharge in January or before that time, but was on one in the hospital per his report; consider anastomotic ulcer versus PUD versus other 2. Symptomatic anemia: Likely related to blood loss from above, though question of hemolysis playing a role 3. DIC: Unclear underlying cause, PET scan negative on 1/23 for obvious cancer recurrence, Dr. Annamaria Boots is following and planning possible bone marrow biopsy 4. History of esophageal carcinoma status post chemotherapy, radiation and surgery 5. Pleural effusion: Drained during last hospital stay 6. 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 Scheduled patient for EGD this afternoon, around 1:00 with Dr. Ardis Hughs. Discussed risks, benefits, limitations and alternatives and the patient agrees to proceed. 2. Continue supportive measures  including PPI 3. Continue to monitor hemoglobin, transfusion less than 7 4. Patient to remain nothing by mouth until after time procedure today 5. Please await any further recommendations from Dr. Ardis Hughs after time of procedure today  Thank you for your kind consultation, we will continue to follow.  Lavone Nian Lemmon  08/29/2016, 10:13 AM Pager #: [phone]   ________________________________________________________________________  Velora Heckler GI MD note:  I personally examined the patient, reviewed the data and agree with the assessment and plan described above.  Planning on EGD today.     Owens Loffler, MD [facility] Pager [phone]

## 2016-08-30 ENCOUNTER — Encounter (HOSPITAL_COMMUNITY)
Admission: AD | Disposition: A | Discharge status: Home Health | Payer: Self-pay | Source: Ambulatory Visit | Attending: Internal Medicine

## 2016-08-30 ENCOUNTER — Observation Stay (HOSPITAL_COMMUNITY): Payer: Medicare Other | Admitting: Anesthesiology

## 2016-08-30 ENCOUNTER — Encounter: Payer: No Typology Code available for payment source | Admitting: Cardiothoracic Surgery

## 2016-08-30 ENCOUNTER — Encounter (HOSPITAL_COMMUNITY): Payer: Self-pay | Admitting: *Deleted

## 2016-08-30 ENCOUNTER — Observation Stay (HOSPITAL_COMMUNITY): Payer: Medicare Other

## 2016-08-30 DIAGNOSIS — D599 Acquired hemolytic anemia, unspecified: Secondary | ICD-10-CM

## 2016-08-30 DIAGNOSIS — Z8711 Personal history of peptic ulcer disease: Secondary | ICD-10-CM | POA: Diagnosis not present

## 2016-08-30 DIAGNOSIS — K5731 Diverticulosis of large intestine without perforation or abscess with bleeding: Secondary | ICD-10-CM

## 2016-08-30 DIAGNOSIS — Z820 Family history of epilepsy and other diseases of the nervous system: Secondary | ICD-10-CM | POA: Diagnosis not present

## 2016-08-30 DIAGNOSIS — E785 Hyperlipidemia, unspecified: Secondary | ICD-10-CM | POA: Diagnosis present

## 2016-08-30 DIAGNOSIS — D65 Disseminated intravascular coagulation [defibrination syndrome]: Secondary | ICD-10-CM | POA: Diagnosis present

## 2016-08-30 DIAGNOSIS — E440 Moderate protein-calorie malnutrition: Secondary | ICD-10-CM | POA: Diagnosis present

## 2016-08-30 DIAGNOSIS — Z8501 Personal history of malignant neoplasm of esophagus: Secondary | ICD-10-CM | POA: Diagnosis not present

## 2016-08-30 DIAGNOSIS — K922 Gastrointestinal hemorrhage, unspecified: Secondary | ICD-10-CM | POA: Diagnosis present

## 2016-08-30 DIAGNOSIS — K921 Melena: Secondary | ICD-10-CM | POA: Diagnosis present

## 2016-08-30 DIAGNOSIS — Z923 Personal history of irradiation: Secondary | ICD-10-CM | POA: Diagnosis not present

## 2016-08-30 DIAGNOSIS — K649 Unspecified hemorrhoids: Secondary | ICD-10-CM | POA: Diagnosis not present

## 2016-08-30 DIAGNOSIS — D696 Thrombocytopenia, unspecified: Secondary | ICD-10-CM | POA: Diagnosis not present

## 2016-08-30 DIAGNOSIS — Z800 Family history of malignant neoplasm of digestive organs: Secondary | ICD-10-CM | POA: Diagnosis not present

## 2016-08-30 DIAGNOSIS — D62 Acute posthemorrhagic anemia: Secondary | ICD-10-CM | POA: Diagnosis present

## 2016-08-30 DIAGNOSIS — K219 Gastro-esophageal reflux disease without esophagitis: Secondary | ICD-10-CM | POA: Diagnosis present

## 2016-08-30 DIAGNOSIS — C7951 Secondary malignant neoplasm of bone: Secondary | ICD-10-CM | POA: Diagnosis present

## 2016-08-30 DIAGNOSIS — I10 Essential (primary) hypertension: Secondary | ICD-10-CM | POA: Diagnosis present

## 2016-08-30 DIAGNOSIS — Z823 Family history of stroke: Secondary | ICD-10-CM | POA: Diagnosis not present

## 2016-08-30 DIAGNOSIS — D649 Anemia, unspecified: Secondary | ICD-10-CM | POA: Diagnosis not present

## 2016-08-30 DIAGNOSIS — Z9221 Personal history of antineoplastic chemotherapy: Secondary | ICD-10-CM | POA: Diagnosis not present

## 2016-08-30 DIAGNOSIS — Z833 Family history of diabetes mellitus: Secondary | ICD-10-CM | POA: Diagnosis not present

## 2016-08-30 DIAGNOSIS — J90 Pleural effusion, not elsewhere classified: Secondary | ICD-10-CM | POA: Diagnosis present

## 2016-08-30 DIAGNOSIS — C155 Malignant neoplasm of lower third of esophagus: Secondary | ICD-10-CM | POA: Diagnosis not present

## 2016-08-30 HISTORY — PX: COLONOSCOPY: SHX5424

## 2016-08-30 LAB — BASIC METABOLIC PANEL
ANION GAP: 7 (ref 5–15)
BUN: 16 mg/dL (ref 6–20)
CO2: 26 mmol/L (ref 22–32)
Calcium: 8.2 mg/dL — ABNORMAL LOW (ref 8.9–10.3)
Chloride: 106 mmol/L (ref 101–111)
Creatinine, Ser: 0.52 mg/dL — ABNORMAL LOW (ref 0.61–1.24)
Glucose, Bld: 109 mg/dL — ABNORMAL HIGH (ref 65–99)
POTASSIUM: 3.9 mmol/L (ref 3.5–5.1)
SODIUM: 139 mmol/L (ref 135–145)

## 2016-08-30 LAB — CBC
HCT: 21 % — ABNORMAL LOW (ref 39.0–52.0)
Hemoglobin: 7.3 g/dL — ABNORMAL LOW (ref 13.0–17.0)
MCH: 30.7 pg (ref 26.0–34.0)
MCHC: 34.8 g/dL (ref 30.0–36.0)
MCV: 88.2 fL (ref 78.0–100.0)
PLATELETS: 50 10*3/uL — AB (ref 150–400)
RBC: 2.38 MIL/uL — AB (ref 4.22–5.81)
RDW: 23 % — ABNORMAL HIGH (ref 11.5–15.5)
WBC: 4.2 10*3/uL (ref 4.0–10.5)

## 2016-08-30 LAB — HEMOGLOBIN AND HEMATOCRIT, BLOOD
HEMATOCRIT: 29 % — AB (ref 39.0–52.0)
HEMOGLOBIN: 9.7 g/dL — AB (ref 13.0–17.0)

## 2016-08-30 LAB — HEPATITIS PANEL, ACUTE
HCV Ab: 0.1 s/co ratio (ref 0.0–0.9)
Hep A IgM: NEGATIVE
Hep B C IgM: NEGATIVE
Hepatitis B Surface Ag: NEGATIVE

## 2016-08-30 LAB — PREPARE RBC (CROSSMATCH)

## 2016-08-30 LAB — SEDIMENTATION RATE: SED RATE: 21 mm/h — AB (ref 0–16)

## 2016-08-30 SURGERY — COLONOSCOPY
Anesthesia: Monitor Anesthesia Care

## 2016-08-30 MED ORDER — LACTATED RINGERS IV SOLN
INTRAVENOUS | Status: DC
  Administered 2016-08-30: 08:00:00 via INTRAVENOUS
  Administered 2016-08-30: 1000 mL via INTRAVENOUS

## 2016-08-30 MED ORDER — PROPOFOL 500 MG/50ML IV EMUL
INTRAVENOUS | Status: DC | PRN
  Administered 2016-08-30: 100 ug/kg/min via INTRAVENOUS

## 2016-08-30 MED ORDER — SODIUM CHLORIDE 0.9 % IV SOLN: INTRAVENOUS | Status: DC

## 2016-08-30 MED ORDER — FREE WATER
220.0000 mL | Freq: Four times a day (QID) | Status: DC
  Administered 2016-08-30 – 2016-09-01 (×5): 220 mL

## 2016-08-30 MED ORDER — PROPOFOL 10 MG/ML IV BOLUS
INTRAVENOUS | Status: AC
  Filled 2016-08-30: qty 40

## 2016-08-30 MED ORDER — SODIUM CHLORIDE 0.9 % IV SOLN: Freq: Once | INTRAVENOUS | Status: DC

## 2016-08-30 MED ORDER — OSMOLITE 1.5 CAL PO LIQD
1000.0000 mL | ORAL | Status: AC
  Administered 2016-08-30: 1000 mL
  Filled 2016-08-30 (×2): qty 1000

## 2016-08-30 MED ORDER — PROPOFOL 10 MG/ML IV BOLUS
INTRAVENOUS | Status: DC | PRN
  Administered 2016-08-30 (×2): 10 mg via INTRAVENOUS
  Administered 2016-08-30: 20 mg via INTRAVENOUS
  Administered 2016-08-30: 10 mg via INTRAVENOUS
  Administered 2016-08-30: 30 mg via INTRAVENOUS

## 2016-08-30 NOTE — Progress Notes (Signed)
PROGRESS NOTE    Fred [name]   JIR:[id number]  DOB: 05/11/1941  DOA: 08/28/2016 PCP: [name], MD   Brief Narrative:  Fred [name] is a 76 y.o. male with medical history significant of esophageal cancer, initially diagnosed in May 2017, status post neoadjuvant chemo and radiation therapy followed by surgical resection in October, status post esophagectomy, cervical esophagogastrostomy, pyloroplasty and J-tube placement. He has had issues with recurrent L sided pleural effusions, DIC and melena. He was last admitted in 1/25, given supportive care with a thoracentesis and blood transfusion. Dr Burr Medico has been following DIC and has requested a direct admit for melena, anemia and thrombocytopenia. GI called for EGD.   Subjective: No complaints.  Assessment & Plan:   Principal Problem:   Melena - EGD on 2/7 and Colonoscopy on 2/8 are negative- no black stool since being admitted - cont to follow for further Melena  Active Problems: Anemia/ DIC -- Hb 7.7 and plt 58,  D Dimer > 20, LDH 1404 - due to acute blood loss and DIC - given 2 U PRBC on 2/5- Hb back down to 7.3- will transfuse 2 more units today - have arranged for bone marrow biopsy tomorrow AM - Dr Burr Medico following  Elevated LFTs - Alk phos (500), AST (105), T bili elevated (1.61) - check hepatitis panel and abdominal ultrasound  Malignant neoplasm of lower third of esophagus   - s/p surgery, chemo, radiation  Pleural effusion - recurrent L sided effusions suspected to be due to history of esophageal cancer  - had had 3 thoracentesis  - currently effusion is small- will follow closely - follows with Dr Servando Snare   DVT prophylaxis: SCDs Code Status: Full code Family Communication: daughter Disposition Plan:  Consultants:   GI  oncology Procedures:   EGD   Colonscopy Antimicrobials:  Anti-infectives    None       Objective: Vitals:   08/30/16 1328 08/30/16 1355 08/30/16 1423 08/30/16 1448  BP:  (!) 112/57 124/63 122/62 116/65  Pulse: 99 96 92 88  Resp: 16 16 16 20   Temp: 99.2 F (37.3 C) 99.6 F (37.6 C) 99 F (37.2 C) 97.8 F (36.6 C)  TempSrc: Oral Oral Oral Oral  SpO2: 99% 99% 99% 99%  Weight:      Height:        Intake/Output Summary (Last 24 hours) at 08/30/16 1548 Last data filed at 08/30/16 1448  Gross per 24 hour  Intake          3745.25 ml  Output                0 ml  Net          3745.25 ml   Filed Weights   08/28/16 1840 08/30/16 0439  Weight: 59.1 kg (130 lb 4.8 oz) 58.8 kg (129 lb 10.1 oz)    Examination: General exam: Appears comfortable  HEENT: PERRLA, oral mucosa moist, no sclera icterus or thrush Respiratory system: Clear to auscultation. Respiratory effort normal. Cardiovascular system: S1 & S2 heard, RRR.  No murmurs  Gastrointestinal system: Abdomen soft, non-tender, nondistended. Normal bowel sound. No organomegaly Central nervous system: Alert and oriented. No focal neurological deficits. Extremities: No cyanosis, clubbing or edema Skin: No rashes or ulcers Psychiatry:  Mood & affect appropriate.     Data Reviewed: I have personally reviewed following labs and imaging studies  CBC:  Recent Labs Lab 08/27/16 0912 08/28/16 1950 08/29/16 0539 08/30/16 0533  WBC 13.1* 6.7 6.1  4.2  NEUTROABS 9.1*  --   --   --   HGB 7.2* 8.1* 7.7* 7.3*  HCT 21.6* 23.9* 22.4* 21.0*  MCV 92.3 86.0 89.6 88.2  PLT 84* 52*  59* 58* 50*   Basic Metabolic Panel:  Recent Labs Lab 08/27/16 0912 08/28/16 1950 08/30/16 0533  NA 133* 136 139  K 4.7 4.7 3.9  CL  --  101 106  CO2 23 28 26   GLUCOSE 190* 114* 109*  BUN 24.2 21* 16  CREATININE 0.7 0.58* 0.52*  CALCIUM 9.0 8.5* 8.2*   GFR: Estimated Creatinine Clearance: 65.3 mL/min (by C-G formula based on SCr of 0.52 mg/dL (L)). Liver Function Tests:  Recent Labs Lab 08/27/16 0912  AST 105*  ALT 37  ALKPHOS 520*  BILITOT 1.61*  PROT 6.1*  ALBUMIN 3.3*   No results for input(s): LIPASE,  AMYLASE in the last 168 hours. No results for input(s): AMMONIA in the last 168 hours. Coagulation Profile:  Recent Labs Lab 08/28/16 1950  INR 1.42   Cardiac Enzymes: No results for input(s): CKTOTAL, CKMB, CKMBINDEX, TROPONINI in the last 168 hours. BNP (last 3 results) No results for input(s): PROBNP in the last 8760 hours. HbA1C: No results for input(s): HGBA1C in the last 72 hours. CBG: No results for input(s): GLUCAP in the last 168 hours. Lipid Profile: No results for input(s): CHOL, HDL, LDLCALC, TRIG, CHOLHDL, LDLDIRECT in the last 72 hours. Thyroid Function Tests: No results for input(s): TSH, T4TOTAL, FREET4, T3FREE, THYROIDAB in the last 72 hours. Anemia Panel: No results for input(s): VITAMINB12, FOLATE, FERRITIN, TIBC, IRON, RETICCTPCT in the last 72 hours. Urine analysis:    Component Value Date/Time   COLORURINE AMBER (A) 08/16/2016 0429   APPEARANCEUR CLEAR 08/16/2016 0429   LABSPEC 1.025 08/16/2016 0429   LABSPEC 1.010 08/15/2016 1224   PHURINE 5.0 08/16/2016 0429   GLUCOSEU NEGATIVE 08/16/2016 0429   GLUCOSEU Negative 08/15/2016 1224   HGBUR NEGATIVE 08/16/2016 0429   BILIRUBINUR NEGATIVE 08/16/2016 0429   BILIRUBINUR Negative 08/15/2016 1224   KETONESUR NEGATIVE 08/16/2016 0429   PROTEINUR 30 (A) 08/16/2016 0429   UROBILINOGEN 0.2 08/15/2016 1224   NITRITE NEGATIVE 08/16/2016 0429   LEUKOCYTESUR NEGATIVE 08/16/2016 0429   LEUKOCYTESUR Negative 08/15/2016 1224   Sepsis Labs: @LABRCNTIP (procalcitonin:4,lacticidven:4) ) Recent Results (from the past 240 hour(s))  Culture, body fluid-bottle     Status: None   Collection Time: 08/22/16 11:22 AM  Result Value Ref Range Status   Specimen Description PLEURAL LEFT  Final   Special Requests NONE  Final   Culture   Final    NO GROWTH 5 DAYS Performed at [facility], [place] [address], [place], [place] [address]    Report Status 08/27/2016 FINAL  Final  Gram stain     Status: None   Collection  Time: 08/22/16 11:22 AM  Result Value Ref Range Status   Specimen Description FLUID LEFT PLEURAL  Final   Special Requests NONE  Final   Gram Stain   Final    RARE WBC PRESENT, PREDOMINANTLY MONONUCLEAR NO ORGANISMS SEEN Performed at [facility], [place] [address], [place], [place] [address]    Report Status 08/23/2016 FINAL  Final  TECHNOLOGIST REVIEW     Status: None   Collection Time: 08/27/16  9:12 AM  Result Value Ref Range Status   Technologist Review   Final    Metas and Myelocytes present, 5% nRBCs, moderate helmets, few spherocytes, sl basophilic stippling  Radiology Studies: Dg Chest Port 1 View  Result Date: 08/28/2016 CLINICAL DATA:  Pleural effusions. Previous smoker. History of esophageal cancer and hypertension. EXAM: PORTABLE CHEST 1 VIEW COMPARISON:  08/22/2016 FINDINGS: Small left pleural effusion is increasing in size since previous study. Atelectasis in the left lung base. Right lung is clear. Emphysematous changes bilaterally. Heart size and pulmonary vascularity are normal. Degenerative changes in the spine and shoulders. IMPRESSION: Small but increasing left pleural effusion and basilar atelectasis. Emphysematous changes in the lungs. Electronically Signed   By: Lucienne Capers M.D.   On: 08/28/2016 22:50   US Abdomen Limited Ruq  Result Date: 08/30/2016 CLINICAL DATA:  Elevated liver function studies. History of esophageal malignancy, hyperlipidemia, former smoker. EXAM: US ABDOMEN LIMITED - RIGHT UPPER QUADRANT COMPARISON:  Ultrasound of August 16, 2016 FINDINGS: Gallbladder: No gallstones or wall thickening visualized. No sonographic Murphy sign noted by sonographer. Common bile duct: Diameter: 5.4 mm Liver: The hepatic echotexture is normal. In the right hepatic lobe posteriorly there is a hyperechoic focus measuring 1.4 x 1.0 x 1.0 cm most compatible with a hemangioma. This is similar in appearance to a hypoechoic liver focus on the March 20, 2016  abdominal CT scan. 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 No acute abnormality within the liver is observed. Normal appearance of the liver and common bile duct. Electronically Signed   By: David  Martinique M.D.   On: 08/30/2016 08:33      Scheduled Meds: . sodium chloride   Intravenous Once  . atenolol  12.5 mg Oral QHS  . atenolol  25 mg Oral Daily  . folic acid  1 mg Oral Daily  . free water  220 mL Per Tube QID  . mirtazapine  15 mg Oral QHS  . pantoprazole (PROTONIX) IV  40 mg Intravenous Q12H  . vitamin B-12  1,000 mcg Oral Daily   Continuous Infusions: . feeding supplement (OSMOLITE 1.5 CAL)       LOS: 1 day    Time spent in minutes: 26    [place], MD [facility] Pager: [email or web address] Password [facility] 08/30/2016, 3:48 PM

## 2016-08-30 NOTE — Consult Note (Signed)
Chief Complaint: Patient was seen in consultation today for CT-guided bone marrow biopsy  Referring Physician(s): Dr. Wynelle Cleveland  Supervising Physician: [facility]  Patient Status: [facility] - In-pt  History of Present Illness: Fred [name] is a 76 y.o. 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  He was admitted to [facility] on 08/28/16 at the request of Dr Burr Medico for DIC, melena, anemia and thrombocytopenia. He presents at the request of Dr. Burr Medico for CT-guided bone marrow biopsy to rule out metastatic disease or primary bone marrow disease. He has had dark stools and a subjective low-grade fever. He denies chest pain, SOB, N/V/D, and bleeding.  Past Medical History:  Diagnosis Date  . Esophageal cancer ([place]) 11/23/15   lower 3rd esohagus   . GERD (gastroesophageal reflux disease)   . Hyperlipidemia   . Hypertension     Past Surgical History:  Procedure Laterality Date  . CHEST TUBE INSERTION Left 04/23/2016   Procedure: CHEST TUBE INSERTION;  Surgeon: [name], MD;  Location: [place];  Service: Thoracic;  Laterality: Left;  . COMPLETE ESOPHAGECTOMY N/A 04/23/2016   Procedure: TRANSHIATIAL TOTAL ESOPHAGECTOMY COMPLETE; CERVICAL ESOPHAGOGASTROSTOMY AND PYLOROPLASTY;  Surgeon: [name], MD;  Location: [place];  Service: Thoracic;  Laterality: N/A;  . cystoscope  4/12/1   prostate  . ERCP    . ESOPHAGOGASTRODUODENOSCOPY (EGD) WITH PROPOFOL N/A 08/29/2016   Procedure: ESOPHAGOGASTRODUODENOSCOPY (EGD) WITH PROPOFOL;  Surgeon: Milus Banister, MD;  Location: [facility];  Service: Endoscopy;  Laterality: N/A;  . INGUINAL HERNIA REPAIR    . JEJUNOSTOMY N/A 04/23/2016   Procedure: FEEDING JEJUNOSTOMY;  Surgeon: [name], MD;  Location: [place];  Service: Thoracic;  Laterality: N/A;  . LEG SURGERY Left    hole  . PROSTATE BIOPSY   10/05/15  . right shoulder surgery    . VIDEO BRONCHOSCOPY N/A 04/23/2016   Procedure: VIDEO BRONCHOSCOPY;  Surgeon: [name], MD;  [facility];  Service: Thoracic;  Laterality: N/A;    Allergies: No known allergies  Medications: Prior to Admission medications   Medication Sig Start Date End Date Taking? Authorizing Provider  acetaminophen (TYLENOL) 325 MG tablet Take 650 mg by mouth every 6 (six) hours as needed.   Yes Historical Provider, MD  atenolol (TENORMIN) 25 MG tablet Take 1 tab in the morning and half tab in the evening Patient taking differently: Take 12.5-25 mg by mouth 2 (two) times daily. Take 47m in the morning and 12.563min the evening 05/24/16  Yes Rhonda G Barrett, PA-C  folic acid (FOLVITE) 1 MG tablet Take 1 tablet (1 mg total) by mouth daily. 08/23/16  Yes Jennifer Chahn-Yang Choi, DO  mirtazapine (REMERON) 15 MG tablet Take 1 tablet (15 mg total) by mouth at bedtime. 08/15/16  Yes YaTruitt MerleMD  Nutritional Supplements (FEEDING SUPPLEMENT, OSMOLITE 1.5 CAL,) LIQD Increase Osmolite 1.5 to 75 mL/hr for 16 hours daily with 140 mL water before and after continuous feeding. Flush or drink additional 600 mL free water daily. Send formula and bags. 08/13/16  Yes YaTruitt MerleMD  ondansetron (ZOFRAN ODT) 8 MG disintegrating tablet Take 1 tablet (8 mg total) by mouth every 8 (eight) hours as needed for nausea or vomiting. 08/15/16  Yes YaTruitt MerleMD  oxycodone (OXY-IR) 5 MG capsule Take 5 mg by mouth every 6 (six) hours as needed for  pain.    Yes Historical Provider, MD  vitamin B-12 1000 MCG tablet Take 1 tablet (1,000 mcg total) by mouth daily. 08/23/16  Yes Shon Millet, DO     Family History  Problem Relation Age of Onset  . Cancer Maternal Grandfather 43    gastric cancer   . Alzheimer's disease Mother   . Diabetes Mother   . Stroke Father 49  . Multiple sclerosis Sister     Social History   Social History  . Marital status: Married    Spouse name:  N/A  . Number of children: 1  . Years of education: N/A   Occupational History  . Truck Geophysicist/field seismologist    Social History Main Topics  . Smoking status: Former Smoker    Packs/day: 1.00    Years: 10.00    Quit date: 07/24/1967  . Smokeless tobacco: Never Used  . Alcohol use No  . Drug use: No  . Sexual activity: Not Currently   Other Topics Concern  . None   Social History Narrative  . None       Review of Systems See HPI  Vital Signs: BP 116/65 (BP Location: Right Arm)   Pulse 88   Temp 97.8 F (36.6 C) (Oral)   Resp 20   Ht 5' 5"  (1.651 m)   Wt 129 lb 10.1 oz (58.8 kg)   SpO2 99%   BMI 21.57 kg/m   Physical Exam  Pt. Resting in NAD, appears pale. Cardiac: RRR, no peripheral edema. Pulm: CTA bilaterally. GI: abdomen soft, non distended, normoactive BS, non-TTP in all 4 quads  Mallampati Score:  MD Evaluation Airway: WNL Heart: WNL Abdomen: WNL Chest/ Lungs: WNL ASA  Classification: 4 Mallampati/Airway Score: Two  Imaging: Dg Chest 1 View  Result Date: 08/22/2016 CLINICAL DATA:  Post left thoracentesis EXAM: CHEST 1 VIEW COMPARISON:  08/16/2016 FINDINGS: Decreasing left effusion following thoracentesis. Small left effusion remains. No pneumothorax. Left base atelectasis. Right lung is clear. Heart is normal size. No acute bony abnormality. IMPRESSION: Small left pleural effusion, decreased following thoracentesis. No pneumothorax. Left base atelectasis. Electronically Signed   By: Rolm Baptise M.D.   On: 08/22/2016 11:52   Dg Chest 1 View  Result Date: 08/02/2016 CLINICAL DATA:  Cough. EXAM: CHEST 1 VIEW COMPARISON:  07/26/2016 . FINDINGS: Mediastinum hilar structures are normal. Left lower lobe subsegmental atelectasis and or infiltrate. Small left pleural effusion. No pneumothorax. Bony lesions involving the left posterior sixth seventh and eighth ribs again cannot be excluded. These are better demonstrated on prior study. Catheter noted over the left upper  abdomen. IMPRESSION: Left lower lobe subsegmental atelectasis and or infiltrate. Small left pleural effusion. Findings have improved slightly from prior study of 07/26/2016. Electronically Signed   By: Marcello Moores  Register   On: 08/02/2016 10:20   US Abdomen Complete  Result Date: 08/17/2016 CLINICAL DATA:  Abnormal liver function. EXAM: ABDOMEN ULTRASOUND COMPLETE COMPARISON:  CT 08/14/2016 FINDINGS: Gallbladder: No gallstones or wall thickening visualized. No sonographic Murphy sign noted by sonographer. Common bile duct: Diameter: Normal caliber, 5 mm Liver: No focal lesion identified. Within normal limits in parenchymal echogenicity. IVC: No abnormality visualized. Pancreas: Visualized portion unremarkable. Spleen: Size and appearance within normal limits. Right Kidney: Length: 11.6 cm. Multiple cysts, the largest 6.1 cm. No hydronephrosis. Normal echotexture. Left Kidney: Length: 10.6 cm. Multiple cysts, the largest 4.7 cm. No hydronephrosis. Normal echotexture. Abdominal aorta: No aneurysm visualized. Other findings: None. IMPRESSION: No acute findings. Bilateral renal cysts. Electronically  Signed   By: Rolm Baptise M.D.   On: 08/17/2016 08:22   Nm Pet Image Restag (ps) Skull Base To Thigh  Result Date: 08/14/2016 CLINICAL DATA:  Subsequent treatment strategy for lower esophageal malignancy. EXAM: NUCLEAR MEDICINE PET SKULL BASE TO THIGH TECHNIQUE: 6.6 mCi F-18 FDG was injected intravenously. Full-ring PET imaging was performed from the skull base to thigh after the radiotracer. CT data was obtained and used for attenuation correction and anatomic localization. FASTING BLOOD GLUCOSE:  Value: 206 mg/dl COMPARISON:  12/05/2015 FINDINGS: NECK No hypermetabolic lymph nodes in the neck. CHEST Esophagectomy with gastric pull-through. Moderate to large left pleural effusion with passive atelectasis. No hypermetabolic activity to suggest recurrent or metastatic disease in the chest at this time. ABDOMEN/PELVIS  Physiologic activity in the bowel. No metastatic lesion in the liver is identified. A left adrenal mass previously measured 1.4 by 1.7 cm on 12/01/2015 and currently measures 1.4 by 1.6 cm on image 109/4. Internal density 15 Hounsfield units, appearance not specific for adenoma on prior MRI from 04/10/2016. This small mass has hypermetabolic activity, maximum SUV 6.2, previously 4.2 back on 12/05/2015. Bilateral photopenic renal cysts. Left-sided percutaneous jejunostomy. Enlarged prostate gland. There is evidence of bilateral inguinal hernia repairs in the past. No hypermetabolic liver lesions. SKELETON No focal hypermetabolic activity to suggest skeletal metastasis. IMPRESSION: 1. Small hypermetabolic left adrenal mass, this has not changed in size compared to 12/01/15 but has a maximum standard uptake value of 6.2 (previously 4.2). Indeterminate density and MRI characteristics, not specific for adenoma. Early metastatic disease is not readily excluded although the lack of change of size is somewhat reassuring. Surveillance recommended. 2. No other hypermetabolic lesions are identified. 3. Moderate to large left pleural effusion with passive atelectasis. 4. Esophagectomy with gastric pull-through. 5. No hypermetabolic liver lesions are seen. 6. Enlarged prostate gland. Electronically Signed   By: Van Clines M.D.   On: 08/14/2016 09:46   Dg Chest Port 1 View  Result Date: 08/28/2016 CLINICAL DATA:  Pleural effusions. Previous smoker. History of esophageal cancer and hypertension. EXAM: PORTABLE CHEST 1 VIEW COMPARISON:  08/22/2016 FINDINGS: Small left pleural effusion is increasing in size since previous study. Atelectasis in the left lung base. Right lung is clear. Emphysematous changes bilaterally. Heart size and pulmonary vascularity are normal. Degenerative changes in the spine and shoulders. IMPRESSION: Small but increasing left pleural effusion and basilar atelectasis. Emphysematous changes in the  lungs. Electronically Signed   By: Lucienne Capers M.D.   On: 08/28/2016 22:50   Dg Chest Port 1 View  Result Date: 08/16/2016 CLINICAL DATA:  76 y/o  M; fever and history of esophageal cancer. EXAM: PORTABLE CHEST 1 VIEW COMPARISON:  08/02/2016 chest radiograph and 08/14/2016 PET-CT. FINDINGS: The heart size and mediastinal contours are within normal limits and stable. Moderate left pleural effusion is increased from prior radiograph with probably stable from prior PET-CT with redistribution to the lung base. Associated left basilar opacity. Otherwise no focal consolidation identified. The visualized skeletal structures are unremarkable. IMPRESSION: Moderate left pleural effusion increased from prior chest radiograph, probably stable from prior chest PET-CT. She fusion of the lung base. Associated left basilar opacity probably represents atelectasis, underline pneumonia is not excluded. Electronically Signed   By: Kristine Garbe M.D.   On: 08/16/2016 19:14   Ct Angio Chest/abd/pel For Dissection W And/or W/wo  Result Date: 08/22/2016 CLINICAL DATA:  History of esophageal cancer. Pleural effusion. Rule out aneurysm or dissection. EXAM: CT ANGIOGRAPHY CHEST, ABDOMEN AND PELVIS  TECHNIQUE: Multidetector CT imaging through the chest, abdomen and pelvis was performed using the standard protocol during bolus administration of intravenous contrast. Multiplanar reconstructed images and MIPs were obtained and reviewed to evaluate the vascular anatomy. CONTRAST:  100 cc Isovue 370 IV COMPARISON:  PET CT 08/14/2016 FINDINGS: CTA CHEST FINDINGS Cardiovascular: Aorta is normal caliber. No dissection. Heart is borderline in size. No filling defects in the pulmonary arteries to suggest pulmonary emboli. Mediastinum/Nodes: Changes of prior esophagectomy with gastric pull-through, stable appearance. No mediastinal, hilar, or axillary adenopathy. Lungs/Pleura: Large left pleural effusion with compressive  atelectasis in the left lower lobe, unchanged. Right lung is clear. Trace right pleural effusion. Musculoskeletal: No acute bony abnormality. Review of the MIP images confirms the above findings. CTA ABDOMEN AND PELVIS FINDINGS VASCULAR Aorta: Normal caliber.  No dissection. Celiac: Widely patent SMA: Widely patent Renals: Single bilaterally, widely patent IMA: Widely patent Inflow: Widely patent Veins: Grossly unremarkable. Review of the MIP images confirms the above findings. NON-VASCULAR Hepatobiliary: No focal hepatic abnormality. Gallbladder unremarkable. Pancreas: Mildly atrophic pancreas. No focal abnormality or ductal dilatation. Spleen: No focal abnormality.  Normal size. Adrenals/Urinary Tract: Small nodule again noted in the left adrenal gland, shown to be hypermetabolic on prior PET CT. This measures approximately 17 mm, unchanged. Right adrenal gland is unremarkable. Bilateral renal cysts. No hydronephrosis. Urinary bladder grossly unremarkable. Stomach/Bowel: Scattered sigmoid diverticulosis. No active diverticulitis. Stomach and small bowel decompressed. Jejunostomy feeding tube in place within the left small bowel loops. Lymphatic: No adenopathy. Reproductive: Prostate enlargement with central calcifications. Other: No free fluid or free air. Musculoskeletal: No acute bony abnormality. Review of the MIP images confirms the above findings. IMPRESSION: No evidence of aortic aneurysm or dissection. No evidence of pulmonary embolus. Heart is borderline in size. Stable small left adrenal nodule which was hypermetabolic on prior PET CT. Stable large left pleural effusion with compressive atelectasis in the left lower lobe. Stable appearance of the gastric pull-through post esophagectomy. Electronically Signed   By: Rolm Baptise M.D.   On: 08/22/2016 12:30   US Abdomen Limited Ruq  Result Date: 08/30/2016 CLINICAL DATA:  Elevated liver function studies. History of esophageal malignancy, hyperlipidemia,  former smoker. EXAM: US ABDOMEN LIMITED - RIGHT UPPER QUADRANT COMPARISON:  Ultrasound of August 16, 2016 FINDINGS: Gallbladder: No gallstones or wall thickening visualized. No sonographic Murphy sign noted by sonographer. Common bile duct: Diameter: 5.4 mm Liver: The hepatic echotexture is normal. In the right hepatic lobe posteriorly there is a hyperechoic focus measuring 1.4 x 1.0 x 1.0 cm most compatible with a hemangioma. This is similar in appearance to a hypoechoic liver focus on the March 20, 2016 abdominal CT scan. 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 No acute abnormality within the liver is observed. Normal appearance of the liver and common bile duct. Electronically Signed   By: David  Martinique M.D.   On: 08/30/2016 08:33   US Thoracentesis Asp Pleural Space W/img Guide  Result Date: 08/22/2016 INDICATION: Esophageal cancer, recurrent left pleural effusion. Request made for diagnostic and therapeutic left thoracentesis. EXAM: ULTRASOUND GUIDED DIAGNOSTIC AND THERAPEUTIC LEFT THORACENTESIS MEDICATIONS: None. COMPLICATIONS: None immediate. PROCEDURE: An ultrasound guided thoracentesis was thoroughly discussed with the patient and questions answered. The benefits, risks, alternatives and complications were also discussed. The patient understands and wishes to proceed with the procedure. Written consent was obtained. Ultrasound was performed to localize and mark an adequate pocket of fluid in the left chest. The area was then prepped and draped in the normal  sterile fashion. 1% Lidocaine was used for local anesthesia. Under ultrasound guidance a Safe-T-Centesis catheter was introduced. Thoracentesis was performed. The catheter was removed and a dressing applied. FINDINGS: A total of approximately 1 liter of yellow fluid was removed. Samples were sent to the laboratory as requested by the clinical team. IMPRESSION: Successful ultrasound guided diagnostic and therapeutic left thoracentesis  yielding 1 liter of pleural fluid. Read by: Rowe Robert, PA-C Electronically Signed   By: Sandi Mariscal M.D.   On: 08/22/2016 11:38   US Thoracentesis Asp Pleural Space W/img Guide  Result Date: 08/02/2016 INDICATION: Patient with history of esophageal cancer, recurrent left pleural effusion. Request made for diagnostic and therapeutic left thoracentesis. EXAM: ULTRASOUND GUIDED DIAGNOSTIC AND THERAPEUTIC LEFT THORACENTESIS MEDICATIONS: None. COMPLICATIONS: None immediate. PROCEDURE: An ultrasound guided thoracentesis was thoroughly discussed with the patient and questions answered. The benefits, risks, alternatives and complications were also discussed. The patient understands and wishes to proceed with the procedure. Written consent was obtained. Ultrasound was performed to localize and mark an adequate pocket of fluid in the left chest. The area was then prepped and draped in the normal sterile fashion. 1% Lidocaine was used for local anesthesia. Under ultrasound guidance a Safe-T-Centesis catheter was introduced. Thoracentesis was performed. The catheter was removed and a dressing applied. FINDINGS: A total of approximately 900 cc of yellow fluid was removed. Samples were sent to the laboratory as requested by the clinical team. IMPRESSION: Successful ultrasound guided diagnostic and therapeutic left thoracentesis yielding 900 cc of pleural fluid. Read by: Rowe Robert, PA-C Electronically Signed   By: Aletta Edouard M.D.   On: 08/02/2016 10:07    Labs:  CBC:  Recent Labs  08/27/16 0912 08/28/16 1950 08/29/16 0539 08/30/16 0533  WBC 13.1* 6.7 6.1 4.2  HGB 7.2* 8.1* 7.7* 7.3*  HCT 21.6* 23.9* 22.4* 21.0*  PLT 84* 52*  59* 58* 50*    COAGS:  Recent Labs  08/18/16 0503 08/19/16 0453 08/20/16 0422 08/28/16 1950  INR 1.44 1.29 1.52 1.42  APTT 54* 44* 46* 53*    BMP:  Recent Labs  08/22/16 0420 08/23/16 0439 08/27/16 0912 08/28/16 1950 08/30/16 0533  NA 135 135 133* 136 139   K 4.2 4.3 4.7 4.7 3.9  CL 99* 98*  --  101 106  CO2 26 28 23 28 26   GLUCOSE 205* 168* 190* 114* 109*  BUN 25* 27* 24.2 21* 16  CALCIUM 8.6* 8.5* 9.0 8.5* 8.2*  CREATININE 0.59* 0.53* 0.7 0.58* 0.52*  GFRNONAA >60 >60  --  >60 >60  GFRAA >60 >60  --  >60 >60    LIVER FUNCTION TESTS:  Recent Labs  08/17/16 0424 08/18/16 0503 08/22/16 0420 08/27/16 0912  BILITOT 1.7* 1.4* 1.5* 1.61*  AST 103* 84* 50* 105*  ALT 52 62 48 37  ALKPHOS 346* 326* 299* 520*  PROT 5.7* 5.3* 5.4* 6.1*  ALBUMIN 3.4* 2.9* 3.1* 3.3*    TUMOR MARKERS: No results for input(s): AFPTM, CEA, CA199, CHROMGRNA in the last 8760 hours.  Assessment and Plan:  Fred [name] is a 76 y.o. 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 He presents at the request of Dr. Burr Medico for CT-guided bone marrow biopsy to rule out metastatic disease or primary bone marrow  disease. Dr. Jarvis Newcomer has reviewed the case and Dr. Laurence Ferrari will perform the procedure tomorrow morning. Risks and benefits discussed with the patient/ wife/ daughter (over the phone) including, but not limited to infection, bleeding, significant bleeding causing loss or decrease in renal function or damage to adjacent structures. All of the patient's questions were answered, patient is agreeable to proceed.Consent signed and in chart.    Thank you for this interesting consult.  I greatly enjoyed meeting TORON BOWRING and look forward to participating in their care.  A copy of this report was sent to the requesting provider on this date.  Electronically Signed: D. Alden Server, PA-S 08/30/2016, 2:56 PM   I spent a total of 30 mins in face to face in clinical consultation, greater than 50% of which  was counseling/coordinating care for CT-guided bone marrow biopsy.

## 2016-08-30 NOTE — Anesthesia Postprocedure Evaluation (Signed)
Anesthesia Post Note  Patient: Fred [name]  Procedure(s) Performed: Procedure(s) (LRB): COLONOSCOPY (N/A)  Patient location during evaluation: Endoscopy Anesthesia Type: MAC Level of consciousness: awake and alert Pain management: pain level controlled Vital Signs Assessment: post-procedure vital signs reviewed and stable Respiratory status: spontaneous breathing, nonlabored ventilation, respiratory function stable and patient connected to nasal cannula oxygen Cardiovascular status: stable and blood pressure returned to baseline Anesthetic complications: no       Last Vitals:  Vitals:   08/30/16 0910 08/30/16 0915  BP: (!) 88/39 (!) 103/45  Pulse:    Resp:    Temp:      Last Pain:  Vitals:   08/30/16 0906  TempSrc: Oral  PainSc:                  Catalina Gravel

## 2016-08-30 NOTE — H&P (View-Only) (Signed)
Fred [name]   DOB:[birth year]   [id number]   [id number]  Hematology and Oncology follow up  Subjective: Patient was admitted yesterday for worsening anemia.  He was seen by gastroenterologist Dr. Ardis Hughs, underwent EGD this afternoon on. His Hb 7.7 this morning, will have blood transfusion again today. No other new complains.   Objective:  Vitals:   08/29/16 1425 08/29/16 1430  BP: 102/62 (!) 109/92  Pulse: 83 80  Resp: 16 (!) 24  Temp:      Body mass index is 21.68 kg/m.  Intake/Output Summary (Last 24 hours) at 08/29/16 1941 Last data filed at 08/29/16 1800  Gross per 24 hour  Intake          1623.75 ml  Output              500 ml  Net          1123.75 ml     Sclerae unicteric  Oropharynx clear  No peripheral adenopathy  Lungs clear -- no rales or rhonchi  Heart regular rate and rhythm  Abdomen benign  MSK no focal spinal tenderness, no peripheral edema  Neuro nonfocal   CBG (last 3)  No results for input(s): GLUCAP in the last 72 hours.   Labs:  Lab Results  Component Value Date   WBC 6.1 08/29/2016   HGB 7.7 (L) 08/29/2016   HCT 22.4 (L) 08/29/2016   MCV 89.6 08/29/2016   PLT 58 (L) 08/29/2016   NEUTROABS 9.1 (H) 08/27/2016   CMP Latest Ref Rng & Units 08/28/2016 08/27/2016 08/23/2016  Glucose 65 - 99 mg/dL 114(H) 190(H) 168(H)  BUN 6 - 20 mg/dL 21(H) 24.2 27(H)  Creatinine 0.61 - 1.24 mg/dL 0.58(L) 0.7 0.53(L)  Sodium 135 - 145 mmol/L 136 133(L) 135  Potassium 3.5 - 5.1 mmol/L 4.7 4.7 4.3  Chloride 101 - 111 mmol/L 101 - 98(L)  CO2 22 - 32 mmol/L 28 23 28   Calcium 8.9 - 10.3 mg/dL 8.5(L) 9.0 8.5(L)  Total Protein 6.4 - 8.3 g/dL - 6.1(L) -  Total Bilirubin 0.20 - 1.20 mg/dL - 1.61(H) -  Alkaline Phos 40 - 150 U/L - 520(H) -  AST 5 - 34 U/L - 105(H) -  ALT 0 - 55 U/L - 37 -    Urine Studies No results for input(s): UHGB, CRYS in the last 72 hours.  Invalid input(s): UACOL, UAPR, USPG, UPH, UTP, UGL, UKET, UBIL, UNIT, UROB, ULEU, UEPI, UWBC, URBC,  Lance Bosch, [place]   Basic Metabolic Panel:  Recent Labs Lab 08/23/16 0439 08/27/16 0912 08/28/16 1950  NA 135 133* 136  K 4.3 4.7 4.7  CL 98*  --  101  CO2 28 23 28   GLUCOSE 168* 190* 114*  BUN 27* 24.2 21*  CREATININE 0.53* 0.7 0.58*  CALCIUM 8.5* 9.0 8.5*   GFR Estimated Creatinine Clearance: 65.7 mL/min (by C-G formula based on SCr of 0.58 mg/dL (L)). Liver Function Tests:  Recent Labs Lab 08/27/16 0912  AST 105*  ALT 37  ALKPHOS 520*  BILITOT 1.61*  PROT 6.1*  ALBUMIN 3.3*   No results for input(s): LIPASE, AMYLASE in the last 168 hours. No results for input(s): AMMONIA in the last 168 hours. Coagulation profile  Recent Labs Lab 08/28/16 1950  INR 1.42    CBC:  Recent Labs Lab 08/23/16 0439 08/27/16 0912 08/28/16 1950 08/29/16 0539  WBC 9.9 13.1* 6.7 6.1  NEUTROABS 7.1 9.1*  --   --   HGB 8.7* 7.2* 8.1*  7.7*  HCT 25.9* 21.6* 23.9* 22.4*  MCV 86.9 92.3 86.0 89.6  PLT 106* 84* 52*  59* 58*   Cardiac Enzymes: No results for input(s): CKTOTAL, CKMB, CKMBINDEX, TROPONINI in the last 168 hours. BNP: Invalid input(s): POCBNP CBG:  Recent Labs Lab 08/22/16 2006 08/23/16 0013 08/23/16 0414  GLUCAP 109* 182* 189*   D-Dimer  Recent Labs  08/28/16 1950  DDIMER >20.00*   Hgb A1c No results for input(s): HGBA1C in the last 72 hours. Lipid Profile No results for input(s): CHOL, HDL, LDLCALC, TRIG, CHOLHDL, LDLDIRECT in the last 72 hours. Thyroid function studies No results for input(s): TSH, T4TOTAL, T3FREE, THYROIDAB in the last 72 hours.  Invalid input(s): FREET3 Anemia work up  [employer]  08/27/16 0912  RETICCTPCT 13.79*   Microbiology Recent Results (from the past 240 hour(s))  Culture, body fluid-bottle     Status: None   Collection Time: 08/22/16 11:22 AM  Result Value Ref Range Status   Specimen Description PLEURAL LEFT  Final   Special Requests NONE  Final   Culture   Final    NO GROWTH 5 DAYS Performed at  [facility], [address], [place], [place] [address]    Report Status 08/27/2016 FINAL  Final  Gram stain     Status: None   Collection Time: 08/22/16 11:22 AM  Result Value Ref Range Status   Specimen Description FLUID LEFT PLEURAL  Final   Special Requests NONE  Final   Gram Stain   Final    RARE WBC PRESENT, PREDOMINANTLY MONONUCLEAR NO ORGANISMS SEEN Performed at [facility], [address], [place], [place] [address]    Report Status 08/23/2016 FINAL  Final  TECHNOLOGIST REVIEW     Status: None   Collection Time: 08/27/16  9:12 AM  Result Value Ref Range Status   Technologist Review   Final    Metas and Myelocytes present, 5% nRBCs, moderate helmets, few spherocytes, sl basophilic stippling      Studies:  Dg Chest Port 1 View  Result Date: 08/28/2016 CLINICAL DATA:  Pleural effusions. Previous smoker. History of esophageal cancer and hypertension. EXAM: PORTABLE CHEST 1 VIEW COMPARISON:  08/22/2016 FINDINGS: Small left pleural effusion is increasing in size since previous study. Atelectasis in the left lung base. Right lung is clear. Emphysematous changes bilaterally. Heart size and pulmonary vascularity are normal. Degenerative changes in the spine and shoulders. IMPRESSION: Small but increasing left pleural effusion and basilar atelectasis. Emphysematous changes in the lungs. Electronically Signed   By: Lucienne Capers M.D.   On: 08/28/2016 22:50    Assessment: 76 y.o. with past medical history of distal esophageal adenocarcinoma, stage IIIa, status post concurrent chemotherapy and radiation, and esophagectomy in October 2017, presented with worsening anemia, and moderate thrombocytopenia   1. Hemolytic anemia and moderate thrombocytopenia, secondary to DIC 2. History of distal esophagus adenocarcinoma, status post concurrent neoadjuvant chemoirradiation, and esophagectomy, NED on PET 08/14/2016.  4. Elevated AST and bilirubin, likely secondary to hemolysis, no  evidence of biliary obstruction or cholecystitis 5. Recurrent left pleural effusion, cytplogy and cultures (-) 6. Hypertension 7. Malnutrition, anorexia, on tube feeds, OK for oral intake    Plan:  -He had extensive work up during his recent hospitalization. The etiology of his DIC is still unclear, recurrent or second malignancy needs to be ruled out -he will have colonoscopy tomorrow to rule out malignancy and source of bleeding. If negative, please consult IR for bone marrow aspiration  and biopsy, rule out metastatic bone disease or primary marrow disease  -I will order lab test to rule out PNH and G6PD deficiency   -monitor CBC daily, fibrinogen and LDH every other day  -will follow up   Truitt Merle, MD 08/29/2016

## 2016-08-30 NOTE — Transfer of Care (Signed)
Immediate Anesthesia Transfer of Care Note  Patient: CURT OATIS  Procedure(s) Performed: Procedure(s): COLONOSCOPY (N/A)  Patient Location: PACU  Anesthesia Type:MAC  Level of Consciousness: Patient easily awoken, sedated, comfortable, cooperative, following commands, responds to stimulation.   Airway & Oxygen Therapy: Patient spontaneously breathing, ventilating well, oxygen via simple oxygen mask.  Post-op Assessment: Report given to PACU RN, vital signs reviewed and stable, moving all extremities.   Post vital signs: Reviewed and stable.  Complications: No apparent anesthesia complications  Last Vitals:  Vitals:   08/30/16 0809 08/30/16 0906  BP: (!) 109/57 (!) 80/37  Pulse: (!) 103 98  Resp: 16 (!) 26  Temp: 36.8 C 36.7 C    Last Pain:  Vitals:   08/30/16 0906  TempSrc: Oral  PainSc:          Complications: No apparent anesthesia complications

## 2016-08-30 NOTE — Op Note (Addendum)
[facility] Patient Name: Fred [name] Procedure Date: 08/30/2016 MRN: [id number] Attending MD: Milus Banister , MD Date of Birth: 1940-10-08 CSN: [id number] Age: 76 Admit Type: Inpatient Procedure:                Colonoscopy Indications:              Dark stools in setting of DIC; esophageal cancer                            diagnosed by Dr. Alice Reichert 2017 s/p esophagectomy                            gastric pull through. Providers:                Milus Banister, MD, Hilma Favors, RN, William Dalton, Technician Referring MD:              Medicines:                Monitored Anesthesia Care Complications:            No immediate complications. Estimated blood loss:                            None. Estimated Blood Loss:     Estimated blood loss: none. Procedure:                Pre-Anesthesia Assessment:                           - Prior to the procedure, a History and Physical                            was performed, and patient medications and                            allergies were reviewed. The patient's tolerance of                            previous anesthesia was also reviewed. The risks                            and benefits of the procedure and the sedation                            options and risks were discussed with the patient.                            All questions were answered, and informed consent                            was obtained. Prior Anticoagulants: The patient has                            taken no previous  anticoagulant or antiplatelet                            agents. ASA Grade Assessment: IV - A patient with                            severe systemic disease that is a constant threat                            to life. After reviewing the risks and benefits,                            the patient was deemed in satisfactory condition to                            undergo the procedure.          After obtaining informed consent, the colonoscope                            was passed under direct vision. Throughout the                            procedure, the patient's blood pressure, pulse, and                            oxygen saturations were monitored continuously. The                            EC-[id number] ([id number]) scope was introduced through                            the anus and advanced to the the cecum, identified                            by appendiceal orifice and ileocecal valve. The                            colonoscopy was performed without difficulty. The                            patient tolerated the procedure well. The quality                            of the bowel preparation was good. The ileocecal                            valve, appendiceal orifice, and rectum were                            photographed. Scope In: 8:40:49 AM Scope Out: 8:58:28 AM Scope Withdrawal Time: 0 hours 5 minutes 47 seconds  Total Procedure Duration: 0 hours 17 minutes 39 seconds  Findings:      Many small and large-mouthed diverticula were found in the left  colon.      Internal hemorrhoids were found. The hemorrhoids were small.      The exam was otherwise without abnormality on direct and retroflexion       views.      No blood (old or fresh) in the colon. Impression:               - Diverticulosis in the left colon. This MAY have                            been site of recent dark stools.                           - Internal hemorrhoids.                           - The examination was otherwise normal on direct                            and retroflexion views.                           - No blood (old or fresh) in the colon. Moderate Sedation:      N/A- Per Anesthesia Care Recommendation:           - Return patient to hospital ward for ongoing care.                            Would continue to workup his DIC, that seems to be                            the  primary illness here. Dr. Burr Medico recommended a                            bone marrow biopsy. No plans for further GI testing                            at this point.                           - Resume previous diet.                           - Continue present medications. Procedure Code(s):        --- Professional ---                           [phone], Colonoscopy, flexible; diagnostic, including                            collection of specimen(s) by brushing or washing,                            when performed (separate procedure) Diagnosis Code(s):        --- Professional ---  K64.8, Other hemorrhoids                           K92.1, Melena (includes Hematochezia)                           K57.30, Diverticulosis of large intestine without                            perforation or abscess without bleeding CPT copyright 2016 American Medical Association. All rights reserved. The codes documented in this report are preliminary and upon coder review may  be revised to meet current compliance requirements. Milus Banister, MD 08/30/2016 9:07:13 AM This report has been signed electronically. Number of Addenda: 0

## 2016-08-30 NOTE — Progress Notes (Addendum)
Nutrition Follow-up  DOCUMENTATION CODES:   Non-severe (moderate) malnutrition in context of chronic illness  INTERVENTION:   Tube Feeds via PEJ- Recommend continue pt's home regimen of Osmolite 1.5 @ 32m/hr x 16hrs overnight.  Recommend 2274mfree water flushes every 6 hrs to meet 10m40mcal/day estimated needs.   Regimen provides 1800kcal/day ( 95-109% estimated needs) 75g protein/day ( 90-107% estimated needs) 1794m310mee water  NUTRITION DIAGNOSIS:   Inadequate oral intake related to inability to eat as evidenced by NPO status.   GOAL:   Patient will meet greater than or equal to 90% of their needs  MONITOR:   TF tolerance, PO intake, Labs, I & O's  ASSESSMENT:   75 y51. male with medical history significant of esophageal cancer, initially diagnosed in May 2017, status post neoadjuvant chemo and radiation therapy followed by surgical resection in October, status post esophagectomy, cervical esophagogastrostomy, pyloroplasty and J-tube placement. He has had issues with recurrent L sided pleural effusions, DIC and melena. He was last admitted in 1/25, given supportive care with a thoracentesis and blood transfusion.   Met with pt in room today. Pt is familiar to RD as RD followed this pt on previous admission. Spoke to pt and wife. Pt is feeling week today, reports that he has not eaten well for three days r/t being NPO for multiple tests. Pta, pt was eating some foods by mouth and doing tube feeds at night. Pt gets 4-5 cans per day of Osmolite 1.5 at home. Pt had EGD on 2/7 with no significant findings. Pt had colonoscopy today and was found to have diverticulosis and internal hemorrhoids but source of GI bleed was not located. Pt would like to continue his home regimen for tube feeds since he tolerates this well. Pt does not do well when his tube feed rate goes above 75ml59m Pt noted to have DIC. Pt will possibly get a bone marrow aspirate per MD note.   Medications reviewed and  include: folic acid, mirtazapine, protonix, vit B-12, zofran, oxycodone   Labs reviewed: creat 0.52(L), Ca 8.2(L) AlkPhos- 520(H), Alb 3.3(L), AST 105(H), tbili 1.61(H)- labs from 2/5 Hgb 7.3(L), Hct 21(L)  Diet Order:  Diet regular Room service appropriate? Yes; Fluid consistency: Thin  Skin:  Reviewed, no issues  Last BM:  2/7  Height:   Ht Readings from Last 1 Encounters:  08/28/16 5' 5"  (1.651 m)    Weight:   Wt Readings from Last 1 Encounters:  08/30/16 129 lb 10.1 oz (58.8 kg)    Ideal Body Weight:  61.82 kg  BMI:  Body mass index is 21.57 kg/m.  Estimated Nutritional Needs:   Kcal:  1655-1890 (28-32 kcal/kg)  Protein:  70-83 grams (1.2-1.4 grams/kg)  Fluid:  >/= 1.8 L/day  EDUCATION NEEDS:   No education needs identified at this time  CaseyKoleen Distance LDN PSunnysider #- [phone]

## 2016-08-30 NOTE — Anesthesia Preprocedure Evaluation (Addendum)
Anesthesia Evaluation  Patient identified by MRN, date of birth, ID band Patient awake    Reviewed: Allergy & Precautions, NPO status , Patient's Chart, lab work & pertinent test results, reviewed documented beta blocker date and time   Airway Mallampati: II  TM Distance: >3 FB Neck ROM: Full    Dental  (+) Teeth Intact, Dental Advisory Given, Missing   Pulmonary former smoker,    Pulmonary exam normal breath sounds clear to auscultation       Cardiovascular hypertension, Pt. on home beta blockers Normal cardiovascular exam Rhythm:Regular Rate:Normal     Neuro/Psych negative neurological ROS     GI/Hepatic Neg liver ROS, GERD  ,Esophageal cancer status post esophagectomy    Endo/Other  negative endocrine ROS  Renal/GU negative Renal ROS     Musculoskeletal negative musculoskeletal ROS (+)   Abdominal   Peds  Hematology  (+) Blood dyscrasia (thrombocytopenia), anemia , Hemolytic anemia and moderate thrombocytopenia, secondary to DIC   Anesthesia Other Findings Day of surgery medications reviewed with the patient.  Reproductive/Obstetrics                           Anesthesia Physical Anesthesia Plan  ASA: III  Anesthesia Plan: MAC   Post-op Pain Management:    Induction: Intravenous  Airway Management Planned: Nasal Cannula  Additional Equipment:   Intra-op Plan:   Post-operative Plan:   Informed Consent: I have reviewed the patients History and Physical, chart, labs and discussed the procedure including the risks, benefits and alternatives for the proposed anesthesia with the patient or authorized representative who has indicated his/her understanding and acceptance.   Dental advisory given  Plan Discussed with: CRNA and Anesthesiologist  Anesthesia Plan Comments: (Discussed risks/benefits/alternatives to MAC sedation including need for ventilatory support, hypotension, need  for conversion to general anesthesia.  All patient questions answered.  Patient/guardian wishes to proceed.)        Anesthesia Quick Evaluation

## 2016-08-30 NOTE — Interval H&P Note (Signed)
History and Physical Interval Note:  08/30/2016 8:25 AM  Fred [name]  has presented today for surgery, with the diagnosis of GI bleed  The various methods of treatment have been discussed with the patient and family. After consideration of risks, benefits and other options for treatment, the patient has consented to  Procedure(s): COLONOSCOPY (N/A) as a surgical intervention .  The patient's history has been reviewed, patient examined, no change in status, stable for surgery.  I have reviewed the patient's chart and labs.  Questions were answered to the patient's satisfaction.     Milus Banister

## 2016-08-31 ENCOUNTER — Other Ambulatory Visit: Payer: Self-pay | Admitting: Hematology

## 2016-08-31 ENCOUNTER — Other Ambulatory Visit: Payer: Self-pay | Admitting: *Deleted

## 2016-08-31 ENCOUNTER — Other Ambulatory Visit: Payer: No Typology Code available for payment source

## 2016-08-31 ENCOUNTER — Inpatient Hospital Stay (HOSPITAL_COMMUNITY): Payer: Medicare Other

## 2016-08-31 ENCOUNTER — Encounter (HOSPITAL_COMMUNITY): Payer: Self-pay | Admitting: Gastroenterology

## 2016-08-31 DIAGNOSIS — D599 Acquired hemolytic anemia, unspecified: Secondary | ICD-10-CM

## 2016-08-31 DIAGNOSIS — C155 Malignant neoplasm of lower third of esophagus: Secondary | ICD-10-CM

## 2016-08-31 HISTORY — PX: IR GENERIC HISTORICAL: IMG1180011

## 2016-08-31 LAB — CBC WITH DIFFERENTIAL/PLATELET
BASOS PCT: 1 %
Basophils Absolute: 0 10*3/uL (ref 0.0–0.1)
EOS ABS: 0 10*3/uL (ref 0.0–0.7)
Eosinophils Relative: 1 %
HCT: 27.9 % — ABNORMAL LOW (ref 39.0–52.0)
HEMOGLOBIN: 9.6 g/dL — AB (ref 13.0–17.0)
LYMPHS PCT: 25 %
Lymphs Abs: 1.1 10*3/uL (ref 0.7–4.0)
MCH: 30 pg (ref 26.0–34.0)
MCHC: 34.4 g/dL (ref 30.0–36.0)
MCV: 87.2 fL (ref 78.0–100.0)
MONOS PCT: 9 %
Monocytes Absolute: 0.4 10*3/uL (ref 0.1–1.0)
NEUTROS ABS: 3 10*3/uL (ref 1.7–7.7)
Neutrophils Relative %: 64 %
Platelets: 51 10*3/uL — ABNORMAL LOW (ref 150–400)
RBC: 3.2 MIL/uL — ABNORMAL LOW (ref 4.22–5.81)
RDW: 20.7 % — ABNORMAL HIGH (ref 11.5–15.5)
WBC: 4.5 10*3/uL (ref 4.0–10.5)

## 2016-08-31 LAB — TYPE AND SCREEN
Blood Product Expiration Date: 201803062359
Blood Product Expiration Date: 201803062359
ISSUE DATE / TIME: 201802081119
ISSUE DATE / TIME: 201802081330
Unit Type and Rh: 5100
Unit Type and Rh: 5100

## 2016-08-31 LAB — BASIC METABOLIC PANEL
Anion gap: 8 (ref 5–15)
BUN: 13 mg/dL (ref 6–20)
CHLORIDE: 105 mmol/L (ref 101–111)
CO2: 24 mmol/L (ref 22–32)
CREATININE: 0.48 mg/dL — AB (ref 0.61–1.24)
Calcium: 8.3 mg/dL — ABNORMAL LOW (ref 8.9–10.3)
GFR calc Af Amer: >60 mL/min (ref >60)
GFR calc non Af Amer: >60 mL/min (ref >60)
Glucose, Bld: 109 mg/dL — ABNORMAL HIGH (ref 65–99)
Potassium: 4 mmol/L (ref 3.5–5.1)
SODIUM: 137 mmol/L (ref 135–145)

## 2016-08-31 LAB — COMPREHENSIVE METABOLIC PANEL
ALK PHOS: 328 U/L — AB (ref 38–126)
ALT: 27 U/L (ref 17–63)
AST: 44 U/L — AB (ref 15–41)
Albumin: 3 g/dL — ABNORMAL LOW (ref 3.5–5.0)
Anion gap: 6 (ref 5–15)
BUN: 14 mg/dL (ref 6–20)
CALCIUM: 8.1 mg/dL — AB (ref 8.9–10.3)
CO2: 24 mmol/L (ref 22–32)
CREATININE: 0.58 mg/dL — AB (ref 0.61–1.24)
Chloride: 108 mmol/L (ref 101–111)
Glucose, Bld: 106 mg/dL — ABNORMAL HIGH (ref 65–99)
Potassium: 4.5 mmol/L (ref 3.5–5.1)
Sodium: 138 mmol/L (ref 135–145)
TOTAL PROTEIN: 5.4 g/dL — AB (ref 6.5–8.1)
Total Bilirubin: 1.5 mg/dL — ABNORMAL HIGH (ref 0.3–1.2)

## 2016-08-31 LAB — PATHOLOGIST SMEAR REVIEW

## 2016-08-31 LAB — D-DIMER, QUANTITATIVE: D-Dimer, Quant: >20 ug/mL-FEU — ABNORMAL HIGH (ref 0.00–0.50)

## 2016-08-31 LAB — FIBRINOGEN: FIBRINOGEN: 143 mg/dL — AB (ref 210–475)

## 2016-08-31 MED ORDER — MIDAZOLAM HCL 2 MG/2ML IJ SOLN
INTRAMUSCULAR | Status: AC | PRN
  Administered 2016-08-31 (×2): 1 mg via INTRAVENOUS

## 2016-08-31 MED ORDER — LIDOCAINE HCL 1 % IJ SOLN
INTRAMUSCULAR | Status: AC
  Filled 2016-08-31: qty 20

## 2016-08-31 MED ORDER — SODIUM CHLORIDE 0.9 % IV SOLN
INTRAVENOUS | Status: AC
  Filled 2016-08-31: qty 250

## 2016-08-31 MED ORDER — HEPARIN SOD (PORK) LOCK FLUSH 100 UNIT/ML IV SOLN
INTRAVENOUS | Status: AC | PRN
  Administered 2016-08-31: 500 [IU]

## 2016-08-31 MED ORDER — FENTANYL CITRATE (PF) 100 MCG/2ML IJ SOLN
INTRAMUSCULAR | Status: AC | PRN
  Administered 2016-08-31: 50 ug via INTRAVENOUS

## 2016-08-31 MED ORDER — FENTANYL CITRATE (PF) 100 MCG/2ML IJ SOLN
INTRAMUSCULAR | Status: AC
  Filled 2016-08-31: qty 4

## 2016-08-31 MED ORDER — MIDAZOLAM HCL 2 MG/2ML IJ SOLN
INTRAMUSCULAR | Status: AC
  Filled 2016-08-31: qty 6

## 2016-08-31 MED ORDER — HEPARIN SOD (PORK) LOCK FLUSH 100 UNIT/ML IV SOLN
INTRAVENOUS | Status: AC
  Filled 2016-08-31: qty 5

## 2016-08-31 MED ORDER — CEFAZOLIN SODIUM-DEXTROSE 2-4 GM/100ML-% IV SOLN
2.0000 g | INTRAVENOUS | Status: AC
  Administered 2016-08-31: 2 g via INTRAVENOUS
  Filled 2016-08-31: qty 100

## 2016-08-31 MED ORDER — ONDANSETRON HCL 4 MG/2ML IJ SOLN: 4.0000 mg | Freq: Once | INTRAMUSCULAR | Status: DC | PRN

## 2016-08-31 MED ORDER — OSMOLITE 1.5 CAL PO LIQD
1000.0000 mL | ORAL | Status: AC
  Administered 2016-08-31: 1000 mL
  Filled 2016-08-31 (×2): qty 1000

## 2016-08-31 MED ORDER — LIDOCAINE HCL 1 % IJ SOLN
INTRAMUSCULAR | Status: AC | PRN
  Administered 2016-08-31: 20 mL

## 2016-08-31 MED ORDER — FENTANYL CITRATE (PF) 100 MCG/2ML IJ SOLN: 25.0000 ug | INTRAMUSCULAR | Status: DC | PRN

## 2016-08-31 NOTE — Sedation Documentation (Signed)
NSR

## 2016-08-31 NOTE — Progress Notes (Signed)
NUTRITION NOTE  Pt discussed during rounds this AM. Another RD saw pt yesterday and is very familiar with this pt from a previous admission. Per rounds, pt is s/p biopsy of R iliac bone and is pending results. He also had a liver scan and hepatitis study with hepatitis study being negative.   Pt currently NPO for Port-a-cath placement but last ate at 0930 this AM while still on a Regular diet. He has PEJ and order in place for Osmolite 1.5 @ 75 mL/hr x16 hours/day and 220 mL free water QID which provides 1800 kcal, 75 grams of protein, and 1794 mL free water. Will continue to monitor TF tolerance and PO intakes.   Estimated Nutritional Needs:  Kcal:  1655-1890 (28-32 kcal/kg) Protein:  70-83 grams (1.2-1.4 grams/kg) Fluid:  >/= 1.8 L/day    Jarome Matin, MS, RD, LDN, [facility] Inpatient Clinical Dietitian Pager # [phone] After hours/weekend pager # [phone]

## 2016-08-31 NOTE — Sedation Documentation (Signed)
Responds to voice.

## 2016-08-31 NOTE — Sedation Documentation (Signed)
Alert

## 2016-08-31 NOTE — Progress Notes (Signed)
[facility] was called spoke with April, RN, CM to give update. Information given to billing concerning pt's insurance should be billed under the [place].

## 2016-08-31 NOTE — Sedation Documentation (Signed)
Responds to voice

## 2016-08-31 NOTE — Progress Notes (Signed)
Interventional Radiology Progress Note  76 yo male presenting as inpt to [place] for port catheter placement.   This am on Friday 2/9 he had a CT guided bone marrow biopsy.   While discussing the informed consent for the port, he tells me he has new right 3rd and 4th finger pain that he noticed after the CT biopsy.  He tells me he noticed it in his room afterwards.  He tells me he is unable to bend his fingers as he could previously.    He has mild tenderness at the right 3rd and 4th fingers and MCP joints.  Limited active range of motion.  No deformity or ecchymosis.  No erythema.   I think low likelihood of bony injury, though given the clearly new onset, we will order a hand Xray to rule out a fracture.   We will proceed with port catheter.    Signed,  Dulcy Fanny. Earleen Newport, DO

## 2016-08-31 NOTE — Procedures (Signed)
Interventional Radiology Procedure Note  Procedure: CT guided aspirate and core biopsy of right iliac bone Complications: None Recommendations: - Bedrest supine x 1 hrs - Hydrocodone PRN  Pain - Follow biopsy results  Signed,  Ashle Stief K. Selestino Nila, MD   

## 2016-08-31 NOTE — Progress Notes (Signed)
I have scheduled pt to have blood transfusion 2u [facility] on Monday 2/12 @9am . Pt is aware the appointment. I ordered type and cross tomorrow morning. Based on his blood transfusion requirement, I anticipate he will need blood transfusion on Monday. Pt will likely be discharged home tomorrow.   Fred [name]  08/31/2016 5:41 PM

## 2016-08-31 NOTE — Progress Notes (Signed)
PROGRESS NOTE    Fred [name]   KJZ:[id number]  DOB: 02-Dec-1940  DOA: 08/28/2016 PCP: [name], MD   Brief Narrative:  Fred [name] is a 76 y.o. male with medical history significant of esophageal cancer, initially diagnosed in May 2017, status post neoadjuvant chemo and radiation therapy followed by surgical resection in October, status post esophagectomy, cervical esophagogastrostomy, pyloroplasty and J-tube placement. He has had issues with recurrent L sided pleural effusions, DIC and melena. He was last admitted in 1/25, given supportive care with a thoracentesis and blood transfusion. Dr Burr Medico has been following DIC and has requested a direct admit for melena, anemia and thrombocytopenia. GI called for EGD.   Subjective: Blood in urine today.  Assessment & Plan:   Principal Problem:   Melena - EGD on 2/7 and Colonoscopy on 2/8 are negative- no black stool since being admitted   Active Problems: Anemia/ DIC -- Hb 7.7 and plt 58,  D Dimer > 20, LDH 1404 - due to acute blood loss and DIC - given 2 U PRBC on 2/5- Hb back down to 7.3 and transfused 2 more units on 2/8- current is 9.6 - bone marrow biopsy performed today show marrow involvement of cancer-  - Dr Burr Medico plans to start chemo- he will receive a port prior to discharge home  Hematuria - follow- transfuse platelets if platelets drop further  Elevated LFTs - Alk phos (500), AST (105), T bili elevated (1.61) - checked hepatitis panel and abdominal ultrasound which were negative  Malignant neoplasm of lower third of esophagus   - s/p surgery, chemo, radiation  Pleural effusion - recurrent L sided effusions suspected to be due to history of esophageal cancer  - had had 3 thoracentesis  - currently effusion is small- will follow closely - follows with Dr Servando Snare   DVT prophylaxis: SCDs Code Status: Full code Family Communication: daughter Disposition Plan:  Consultants:   GI  oncology Procedures:     EGD   Colonscopy Antimicrobials:  Anti-infectives    Start     Dose/Rate Route Frequency Ordered Stop   08/31/16 1530  ceFAZolin (ANCEF) IVPB 2g/100 mL premix     2 g 200 mL/hr over 30 Minutes Intravenous To Radiology 08/31/16 1045 09/01/16 1530       Objective: Vitals:   08/31/16 0822 08/31/16 0830 08/31/16 0832 08/31/16 0836  BP: 133/68 124/62 115/64 (!) 103/58  Pulse: 91 94 91 95  Resp: 18 19 (!) 21 16  Temp:      TempSrc:      SpO2: 98% 97% 97% 97%  Weight:      Height:        Intake/Output Summary (Last 24 hours) at 08/31/16 1327 Last data filed at 08/31/16 0000  Gross per 24 hour  Intake          1677.75 ml  Output                0 ml  Net          1677.75 ml   Filed Weights   08/28/16 1840 08/30/16 0439  Weight: 59.1 kg (130 lb 4.8 oz) 58.8 kg (129 lb 10.1 oz)    Examination: General exam: Appears comfortable  HEENT: PERRLA, oral mucosa moist, no sclera icterus or thrush Respiratory system: Clear to auscultation. Respiratory effort normal. Cardiovascular system: S1 & S2 heard, RRR.  No murmurs  Gastrointestinal system: Abdomen soft, non-tender, nondistended. Normal bowel sound. No organomegaly Central nervous system: Alert and  oriented. No focal neurological deficits. Extremities: No cyanosis, clubbing or edema Skin: No rashes or ulcers Psychiatry:  Mood & affect appropriate.     Data Reviewed: I have personally reviewed following labs and imaging studies  CBC:  Recent Labs Lab 08/27/16 0912 08/28/16 1950 08/29/16 0539 08/30/16 0533 08/30/16 1755 08/31/16 0508  WBC 13.1* 6.7 6.1 4.2  --  4.5  NEUTROABS 9.1*  --   --   --   --  3.0  HGB 7.2* 8.1* 7.7* 7.3* 9.7* 9.6*  HCT 21.6* 23.9* 22.4* 21.0* 29.0* 27.9*  MCV 92.3 86.0 89.6 88.2  --  87.2  PLT 84* 52*  59* 58* 50*  --  51*   Basic Metabolic Panel:  Recent Labs Lab 08/27/16 0912 08/28/16 1950 08/30/16 0533 08/31/16 0508  NA 133* 136 139 137  K 4.7 4.7 3.9 4.0  CL  --  101 106  105  CO2 23 28 26 24   GLUCOSE 190* 114* 109* 109*  BUN 24.2 21* 16 13  CREATININE 0.7 0.58* 0.52* 0.48*  CALCIUM 9.0 8.5* 8.2* 8.3*   GFR: Estimated Creatinine Clearance: 65.3 mL/min (by C-G formula based on SCr of 0.48 mg/dL (L)). Liver Function Tests:  Recent Labs Lab 08/27/16 0912  AST 105*  ALT 37  ALKPHOS 520*  BILITOT 1.61*  PROT 6.1*  ALBUMIN 3.3*   No results for input(s): LIPASE, AMYLASE in the last 168 hours. No results for input(s): AMMONIA in the last 168 hours. Coagulation Profile:  Recent Labs Lab 08/28/16 1950  INR 1.42   Cardiac Enzymes: No results for input(s): CKTOTAL, CKMB, CKMBINDEX, TROPONINI in the last 168 hours. BNP (last 3 results) No results for input(s): PROBNP in the last 8760 hours. HbA1C: No results for input(s): HGBA1C in the last 72 hours. CBG: No results for input(s): GLUCAP in the last 168 hours. Lipid Profile: No results for input(s): CHOL, HDL, LDLCALC, TRIG, CHOLHDL, LDLDIRECT in the last 72 hours. Thyroid Function Tests: No results for input(s): TSH, T4TOTAL, FREET4, T3FREE, THYROIDAB in the last 72 hours. Anemia Panel: No results for input(s): VITAMINB12, FOLATE, FERRITIN, TIBC, IRON, RETICCTPCT in the last 72 hours. Urine analysis:    Component Value Date/Time   COLORURINE AMBER (A) 08/16/2016 0429   APPEARANCEUR CLEAR 08/16/2016 0429   LABSPEC 1.025 08/16/2016 0429   LABSPEC 1.010 08/15/2016 1224   PHURINE 5.0 08/16/2016 0429   GLUCOSEU NEGATIVE 08/16/2016 0429   GLUCOSEU Negative 08/15/2016 1224   HGBUR NEGATIVE 08/16/2016 0429   BILIRUBINUR NEGATIVE 08/16/2016 0429   BILIRUBINUR Negative 08/15/2016 1224   KETONESUR NEGATIVE 08/16/2016 0429   PROTEINUR 30 (A) 08/16/2016 0429   UROBILINOGEN 0.2 08/15/2016 1224   NITRITE NEGATIVE 08/16/2016 0429   LEUKOCYTESUR NEGATIVE 08/16/2016 0429   LEUKOCYTESUR Negative 08/15/2016 1224   Sepsis Labs: @LABRCNTIP (procalcitonin:4,lacticidven:4) ) Recent Results (from the past  240 hour(s))  Culture, body fluid-bottle     Status: None   Collection Time: 08/22/16 11:22 AM  Result Value Ref Range Status   Specimen Description PLEURAL LEFT  Final   Special Requests NONE  Final   Culture   Final    NO GROWTH 5 DAYS Performed at [facility] [address], [place], [place] [address]    Report Status 08/27/2016 FINAL  Final  Gram stain     Status: None   Collection Time: 08/22/16 11:22 AM  Result Value Ref Range Status   Specimen Description FLUID LEFT PLEURAL  Final   Special Requests NONE  Final   Gram Stain   Final    RARE WBC PRESENT, PREDOMINANTLY MONONUCLEAR NO ORGANISMS SEEN Performed at [facility], [address], [place],  [address]    Report Status 08/23/2016 FINAL  Final  TECHNOLOGIST REVIEW     Status: None   Collection Time: 08/27/16  9:12 AM  Result Value Ref Range Status   Technologist Review   Final    Metas and Myelocytes present, 5% nRBCs, moderate helmets, few spherocytes, sl basophilic stippling         Radiology Studies: US Abdomen Limited Ruq  Result Date: 08/30/2016 CLINICAL DATA:  Elevated liver function studies. History of esophageal malignancy, hyperlipidemia, former smoker. EXAM: US ABDOMEN LIMITED - RIGHT UPPER QUADRANT COMPARISON:  Ultrasound of August 16, 2016 FINDINGS: Gallbladder: No gallstones or wall thickening visualized. No sonographic Murphy sign noted by sonographer. Common bile duct: Diameter: 5.4 mm Liver: The hepatic echotexture is normal. In the right hepatic lobe posteriorly there is a hyperechoic focus measuring 1.4 x 1.0 x 1.0 cm most compatible with a hemangioma. This is similar in appearance to a hypoechoic liver focus on the March 20, 2016 abdominal CT scan. 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 No acute abnormality within the liver is observed. Normal appearance of the liver and common bile duct. Electronically Signed   By: David  Martinique M.D.   On: 08/30/2016 08:33       Scheduled Meds: . sodium chloride   Intravenous Once  . atenolol  12.5 mg Oral QHS  . atenolol  25 mg Oral Daily  .  ceFAZolin (ANCEF) IV  2 g Intravenous to XRAY  . folic acid  1 mg Oral Daily  . free water  220 mL Per Tube QID  . mirtazapine  15 mg Oral QHS  . pantoprazole (PROTONIX) IV  40 mg Intravenous Q12H  . sodium chloride      . sodium chloride      . vitamin B-12  1,000 mcg Oral Daily   Continuous Infusions:    LOS: 2 days    Time spent in minutes: 40    Woodson, MD [facility] Pager: [email or web address] Password [email or web address] 08/31/2016, 1:27 PM

## 2016-08-31 NOTE — Procedures (Signed)
Interventional Radiology Procedure Note  Procedure: Placement of a right IJ approach single lumen PowerPort.  Tip is positioned at the superior cavoatrial junction and catheter is ready for immediate use.  Complications: None Recommendations:  - Ok to shower tomorrow - Do not submerge for 7 days - Routine line care   Signed,  Marena Witts S. Haani Bakula, DO   

## 2016-08-31 NOTE — Progress Notes (Addendum)
Fred [name]   DOB:01-11-1941   FV#:[id number]   FMB#:[id number]  Hematology and Oncology follow up  Subjective: Patient was admitted on 08/28/16 for worsening anemia.  He was seen by gastroenterologist Dr. Ardis Hughs, underwent EGD and colonoscopy on 08/29/16. Both were normal. His Hb 7.7 the morning of 08/29/16 and had another blood transfusion that day. Hb on 08/30/16 was 9.7 and 9.6 today. The patient denies pain, but he has hematuria. Platelets 51K today.  Objective:  Vitals:   08/31/16 0832 08/31/16 0836  BP: 115/64 (!) 103/58  Pulse: 91 95  Resp: (!) 21 16  Temp:      Body mass index is 21.57 kg/m.  Intake/Output Summary (Last 24 hours) at 08/31/16 0959 Last data filed at 08/31/16 0000  Gross per 24 hour  Intake          2497.75 ml  Output                0 ml  Net          2497.75 ml     Sclerae unicteric  Oropharynx clear  No peripheral adenopathy  Lungs clear -- no rales or rhonchi  Heart regular rate and rhythm  Abdomen benign  MSK no focal spinal tenderness, no peripheral edema  Neuro nonfocal   CBG (last 3)  No results for input(s): GLUCAP in the last 72 hours.   Labs:  Lab Results  Component Value Date   WBC 4.5 08/31/2016   HGB 9.6 (L) 08/31/2016   HCT 27.9 (L) 08/31/2016   MCV 87.2 08/31/2016   PLT 51 (L) 08/31/2016   NEUTROABS 3.0 08/31/2016   CMP Latest Ref Rng & Units 08/31/2016 08/30/2016 08/28/2016  Glucose 65 - 99 mg/dL 109(H) 109(H) 114(H)  BUN 6 - 20 mg/dL 13 16 21(H)  Creatinine 0.61 - 1.24 mg/dL 0.48(L) 0.52(L) 0.58(L)  Sodium 135 - 145 mmol/L 137 139 136  Potassium 3.5 - 5.1 mmol/L 4.0 3.9 4.7  Chloride 101 - 111 mmol/L 105 106 101  CO2 22 - 32 mmol/L 24 26 28   Calcium 8.9 - 10.3 mg/dL 8.3(L) 8.2(L) 8.5(L)  Total Protein 6.4 - 8.3 g/dL - - -  Total Bilirubin 0.20 - 1.20 mg/dL - - -  Alkaline Phos 40 - 150 U/L - - -  AST 5 - 34 U/L - - -  ALT 0 - 55 U/L - - -    Urine Studies No results for input(s): UHGB, CRYS in the last 72 hours.  Invalid  input(s): UACOL, UAPR, USPG, UPH, UTP, UGL, UKET, UBIL, UNIT, UROB, ULEU, UEPI, UWBC, [place] Duff[place], [place]   Basic Metabolic Panel:  Recent Labs Lab 08/27/16 0912  08/28/16 1950 08/30/16 0533 08/31/16 0508  NA 133*  --  136 139 137  K 4.7  < > 4.7 3.9 4.0  CL  --   --  101 106 105  CO2 23  --  28 26 24   GLUCOSE 190*  --  114* 109* 109*  BUN 24.2  --  21* 16 13  CREATININE 0.7  --  0.58* 0.52* 0.48*  CALCIUM 9.0  --  8.5* 8.2* 8.3*  < > = values in this interval not displayed. GFR Estimated Creatinine Clearance: 65.3 mL/min (by C-G formula based on SCr of 0.48 mg/dL (L)). Liver Function Tests:  Recent Labs Lab 08/27/16 0912  AST 105*  ALT 37  ALKPHOS 520*  BILITOT 1.61*  PROT 6.1*  ALBUMIN 3.3*   No results for  input(s): LIPASE, AMYLASE in the last 168 hours. No results for input(s): AMMONIA in the last 168 hours. Coagulation profile  Recent Labs Lab 08/28/16 1950  INR 1.42    CBC:  Recent Labs Lab 08/27/16 0912 08/28/16 1950 08/29/16 0539 08/30/16 0533 08/30/16 1755 08/31/16 0508  WBC 13.1* 6.7 6.1 4.2  --  4.5  NEUTROABS 9.1*  --   --   --   --  3.0  HGB 7.2* 8.1* 7.7* 7.3* 9.7* 9.6*  HCT 21.6* 23.9* 22.4* 21.0* 29.0* 27.9*  MCV 92.3 86.0 89.6 88.2  --  87.2  PLT 84* 52*  59* 58* 50*  --  51*   Cardiac Enzymes: No results for input(s): CKTOTAL, CKMB, CKMBINDEX, TROPONINI in the last 168 hours. BNP: Invalid input(s): POCBNP CBG: No results for input(s): GLUCAP in the last 168 hours. D-Dimer  Recent Labs  08/28/16 1950 08/31/16 0508  DDIMER >20.00* >20.00*   Hgb A1c No results for input(s): HGBA1C in the last 72 hours. Lipid Profile No results for input(s): CHOL, HDL, LDLCALC, TRIG, CHOLHDL, LDLDIRECT in the last 72 hours. Thyroid function studies No results for input(s): TSH, T4TOTAL, T3FREE, THYROIDAB in the last 72 hours.  Invalid input(s): FREET3 Anemia work up No results for input(s): VITAMINB12, FOLATE, FERRITIN, TIBC,  IRON, RETICCTPCT in the last 72 hours. Microbiology Recent Results (from the past 240 hour(s))  Culture, body fluid-bottle     Status: None   Collection Time: 08/22/16 11:22 AM  Result Value Ref Range Status   Specimen Description PLEURAL LEFT  Final   Special Requests NONE  Final   Culture   Final    NO GROWTH 5 DAYS Performed at [facility], [address] [place], [place] [address]    Report Status 08/27/2016 FINAL  Final  Gram stain     Status: None   Collection Time: 08/22/16 11:22 AM  Result Value Ref Range Status   Specimen Description FLUID LEFT PLEURAL  Final   Special Requests NONE  Final   Gram Stain   Final    RARE WBC PRESENT, PREDOMINANTLY MONONUCLEAR NO ORGANISMS SEEN Performed at [facility], [place] [address], [place], [place] [address]    Report Status 08/23/2016 FINAL  Final  TECHNOLOGIST REVIEW     Status: None   Collection Time: 08/27/16  9:12 AM  Result Value Ref Range Status   Technologist Review   Final    Metas and Myelocytes present, 5% nRBCs, moderate helmets, few spherocytes, sl basophilic stippling      Studies:  US Abdomen Limited Ruq  Result Date: 08/30/2016 CLINICAL DATA:  Elevated liver function studies. History of esophageal malignancy, hyperlipidemia, former smoker. EXAM: US ABDOMEN LIMITED - RIGHT UPPER QUADRANT COMPARISON:  Ultrasound of August 16, 2016 FINDINGS: Gallbladder: No gallstones or wall thickening visualized. No sonographic Murphy sign noted by sonographer. Common bile duct: Diameter: 5.4 mm Liver: The hepatic echotexture is normal. In the right hepatic lobe posteriorly there is a hyperechoic focus measuring 1.4 x 1.0 x 1.0 cm most compatible with a hemangioma. This is similar in appearance to a hypoechoic liver focus on the March 20, 2016 abdominal CT scan. 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 No acute abnormality within the liver is observed. Normal appearance of the liver and common bile duct.  Electronically Signed   By: David  Martinique M.D.   On: 08/30/2016 08:33    Assessment: 76 y.o. with past medical history of distal esophageal adenocarcinoma, stage IIIa, status  post concurrent chemotherapy and radiation, and esophagectomy in October 2017, presented with worsening anemia, and moderate thrombocytopenia   1. Hemolytic anemia and moderate thrombocytopenia, secondary to DIC 2. History of distal esophagus adenocarcinoma, status post concurrent neoadjuvant chemoirradiation, and esophagectomy, NED on PET 08/14/2016. Now biopsy confirmed diffuse bone metastasis 4. Elevated AST and bilirubin, likely secondary to hemolysis, no evidence of biliary obstruction or cholecystitis 5. Recurrent left pleural effusion, cytplogy and cultures (-) 6. Hypertension 7. Malnutrition, anorexia, on tube feeds, OK for oral intake    Plan:  -Bone marrow aspiration and biopsy this morning was dry tap, touch revealed adenocarcinoma likely from the esophageal cancer. I spoke with pathologist Dr. Tammi Klippel this morning, I will request HER2 testing of the sample. The formal pathology report will be sent out on Monday after reviewing the bone marrow biopsy.  -We discussed incurable nature of his disease since it is now in his bones.  -I'll offer him palliative chemotherapy, FOLFOX start early next week, and placement of a port by interventional radiology today or next Monday --Chemotherapy consent: Side effects including but does not not limited to, fatigue, nausea, vomiting, diarrhea, hair loss, neuropathy, fluid retention, renal and kidney dysfunction, neutropenic fever, needed for blood transfusion, bleeding, were discussed with patient in great detail. He agrees to proceed. -We'll dose reduce for for cycle due to his cytopenia, especially thrombocytopenia. -The goal of therapy is palliative -I'll request his tumor to be tested for HER-2, to see if is a candidate for Herceptin therapy -His DIC is likely secondary to  his underlying metastatic cancer  -Advised the patient to refrain using Motrin or Aspirin for pain. Advised him to not get cut or fall. -We discussed taking Vitamin D and Calcium for bone health. -The patient appears to be blood transfusion dependent and may require a blood transfusion on Monday, 09/03/16. -The patient will be discharged tomorrow if clinically stable -I'll set up for his clinical follow-up, lab, blood transfusion and chemotherapy.  Truitt Merle, MD 08/31/2016    This document serves as a record of services personally performed by Truitt Merle, MD. It was created on her behalf by Darcus Austin, a trained medical scribe. The creation of this record is based on the scribe's personal observations and the provider's statements to them. This document has been checked and approved by the attending provider.

## 2016-08-31 NOTE — Progress Notes (Signed)
START ON PATHWAY REGIMEN - Gastroesophageal  GEOS3: mFOLFOX6 q14 Days Until Progression or Unacceptable Toxicity   A cycle is every 14 days:     Oxaliplatin (Eloxatin(R)) 85 mg/m2 in 250 mL D5W IV over 2 hours day 1, q14 days Dose Mod: None     Leucovorin 400 mg/m2 in 250 mL D5W IV over 2 hours day 1, q14 days, followed immediately by Dose Mod: None     5-Fluorouracil 400 mg/m2 IV bolus over 2-4 minutes day 1, q14 days Dose Mod: None     5-Fluorouracil 2,400 mg/m2 in _____mL NS CIV as a 46 hour infusion starting on day 1, q14 days Dose Mod: None Additional Orders: **Note: order sheet contains two q14 day cycles**  **Always confirm dose/schedule in your pharmacy ordering system**    Patient Characteristics: Distant Metastases (cM1/pM1) / Locally Recurrent Disease, Adenocarcinoma - Esophageal, GE Junction, and Gastric, First Line, HER2 Negative / Unknown Histology: Adenocarcinoma Disease Classification: Esophageal Therapeutic Status: Distant Metastases (No Additional Staging) Line of therapy: First Line Would you be surprised if this patient died  in the next year? I would NOT be surprised if this patient died in the next year HER2 Status: Awaiting Test Results  Intent of Therapy: Non-Curative / Palliative Intent, Discussed with Patient 

## 2016-08-31 NOTE — Progress Notes (Signed)
Patient ID: Fred [name], male   DOB: 1940/12/29, 76 y.o.   MRN: [id number] Pt s/p CT guided BM biopsy earlier today. Request now received from Dr. Burr Medico  for port a cath placement in pt. Pt last ate at 0930 this am. Will keep NPO except meds for now and attempt port placement later this afternoon if schedule/staffing allows. Otherwise pt will be scheduled for 2/12. Risks and benefits discussed with the patient/wife including, but not limited to bleeding, infection, pneumothorax, or fibrin sheath development and need for additional procedures.All of the patient's questions were answered, patient is agreeable to proceed.Consent signed and in chart.

## 2016-09-01 DIAGNOSIS — R945 Abnormal results of liver function studies: Secondary | ICD-10-CM

## 2016-09-01 DIAGNOSIS — R319 Hematuria, unspecified: Secondary | ICD-10-CM

## 2016-09-01 DIAGNOSIS — R7989 Other specified abnormal findings of blood chemistry: Secondary | ICD-10-CM

## 2016-09-01 DIAGNOSIS — R310 Gross hematuria: Secondary | ICD-10-CM

## 2016-09-01 LAB — CBC
HEMATOCRIT: 25.8 % — AB (ref 39.0–52.0)
Hemoglobin: 8.8 g/dL — ABNORMAL LOW (ref 13.0–17.0)
MCH: 30.4 pg (ref 26.0–34.0)
MCHC: 34.1 g/dL (ref 30.0–36.0)
MCV: 89.3 fL (ref 78.0–100.0)
PLATELETS: 54 10*3/uL — AB (ref 150–400)
RBC: 2.89 MIL/uL — ABNORMAL LOW (ref 4.22–5.81)
RDW: 22 % — AB (ref 11.5–15.5)
WBC: 4.6 10*3/uL (ref 4.0–10.5)

## 2016-09-01 MED ORDER — FREE WATER: 220.0000 mL | Freq: Four times a day (QID) | Status: DC

## 2016-09-01 NOTE — Discharge Summary (Addendum)
Physician Discharge Summary  Fred [name] HAF:[id number] DOB: May 29, 1941 DOA: 08/28/2016  PCP: [name], MD  Admit date: 08/28/2016 Discharge date: 09/01/2016  Admitted From: home  Disposition:  home   Recommendations for Outpatient Follow-up:  1. Will f/u with Oncology next week on Monday 2. F/u L pleural effusion  Home Health:  ordered  Equipment/Devices:  none    Discharge Condition:  stable   CODE STATUS:  Full code   Diet recommendation:  Regular diet Consultations:  Oncology  IR  GI    Discharge Diagnoses:  Principal Problem:   Melena Active Problems:   DIC (disseminated intravascular coagulation) ([place])   Acquired hemolytic anemia (HCC)   Hematuria   Malignant neoplasm of lower third of esophagus (HCC)   Symptomatic anemia   Thrombocytopenia (HCC)   Pleural effusion   Diverticulosis of colon with hemorrhage   Hemorrhoids   Elevated LFTs    Subjective: No further hematuria today. Port and bone marrow biopsy sites are not painful. Ready to go home.   Brief Summary: Fred [name] a 76 y.o.malewith medical history significant of esophageal cancer, initially diagnosed in May 2017, status post neoadjuvantchemo and radiation therapy followed by surgical resection in [place] post esophagectomy, cervical esophagogastrostomy, pyloroplasty and J-tube placement. He has had issues with recurrent L sided pleural effusions, DIC and melena. He was last admitted in 1/25, given supportive care with a thoracentesis and blood transfusion. Dr Burr Medico has been following DIC and has requested a direct admit for melena, anemia and thrombocytopenia. GI called for EGD.    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 - On admission: Hb 7.7 and plt 58,  D Dimer > 20, LDH 1404 - transfused 2 U PRBC on 2/5 at cancer center by Dr Burr Medico - Hb back down to 7.3 and transfused 2 more units on 2/8- current is at 8.8- Dr Burr Medico expects he will need a transfusion by Monday- he will be seen at the Cancer center  - platelets have been stable in the 12s and we have not had the need to transfuse - bone marrow biopsy performed 2/9 unfortunately shows marrow involvement with adenocarcinoma-  - Dr Burr Medico plans to start chemo with FOLFOX next week - he received a port yesterday by IR and is stable to go home today  Nutrition - receives Osmolite through J tube ( fall 2017)  to supplement oral diet  Hematuria - occurred yesterday and has resolved today  Elevated LFTs - Alk phos (500), AST (105), T bili elevated (1.61) - checked hepatitis panel and abdominal ultrasound which were negative - likely due to cancer  Pleural effusion - recurrent L sided effusions suspected to be due to history of esophageal cancer  - had had 3 thoracentesis  - currently effusion is small  - follows with Dr Servando Snare   Discharge Instructions  Discharge Instructions    Diet general    Complete by:  As directed    Increase activity slowly    Complete by:  As directed      Allergies as of 09/01/2016      Reactions   No Known Allergies  Medication List    TAKE these medications   acetaminophen 325 MG tablet Commonly known as:  TYLENOL Take 650 mg by mouth every 6 (six) hours as needed.   atenolol 25 MG tablet Commonly known as:  TENORMIN Take 1 tab in the morning and half tab in the evening What changed:  how much to take  how to take this  when to take this  additional instructions   cyanocobalamin 1000 MCG tablet Take 1 tablet (1,000 mcg total) by mouth daily.   feeding supplement (OSMOLITE 1.5 CAL) Liqd Increase  Osmolite 1.5 to 75 mL/hr for 16 hours daily with 140 mL water before and after continuous feeding. Flush or drink additional 600 mL free water daily. Send formula and bags.   folic acid 1 MG tablet Commonly known as:  FOLVITE Take 1 tablet (1 mg total) by mouth daily.   free water Soln Place 220 mLs into feeding tube 4 (four) times daily.   mirtazapine 15 MG tablet Commonly known as:  REMERON Take 1 tablet (15 mg total) by mouth at bedtime.   ondansetron 8 MG disintegrating tablet Commonly known as:  ZOFRAN ODT Take 1 tablet (8 mg total) by mouth every 8 (eight) hours as needed for nausea or vomiting.   oxycodone 5 MG capsule Commonly known as:  OXY-IR Take 5 mg by mouth every 6 (six) hours as needed for pain.       Allergies  Allergen Reactions  . No Known Allergies      Procedures/Studies: EGD: - Normal post esophagectomy/gastric pull up anatomy                            without any old or fresh blood in the UGI tract Colonoscopy: - Diverticulosis in the left colon. This MAY have                            been site of recent dark stools.                           - Internal hemorrhoids.                           - The examination was otherwise normal on direct                            and retroflexion views.                           - No blood (old or fresh) in the colon.  Dg Chest 1 View  Result Date: 08/22/2016 CLINICAL DATA:  Post left thoracentesis EXAM: CHEST 1 VIEW COMPARISON:  08/16/2016 FINDINGS: Decreasing left effusion following thoracentesis. Small left effusion remains. No pneumothorax. Left base atelectasis. Right lung is clear. Heart is normal size. No acute bony abnormality. IMPRESSION: Small left pleural effusion, decreased following thoracentesis. No pneumothorax. Left base atelectasis. Electronically Signed   By: Rolm Baptise M.D.   On: 08/22/2016 11:52   Dg Chest 1 View  Result Date: 08/02/2016 CLINICAL DATA:  Cough. EXAM: CHEST 1 VIEW  COMPARISON:  07/26/2016 . FINDINGS: Mediastinum hilar structures are normal. Left lower lobe subsegmental atelectasis and or infiltrate. Small left pleural effusion. No pneumothorax. Bony lesions involving  the left posterior sixth seventh and eighth ribs again cannot be excluded. These are better demonstrated on prior study. Catheter noted over the left upper abdomen. IMPRESSION: Left lower lobe subsegmental atelectasis and or infiltrate. Small left pleural effusion. Findings have improved slightly from prior study of 07/26/2016. Electronically Signed   By: Marcello Moores  Register   On: 08/02/2016 10:20   US Abdomen Complete  Result Date: 08/17/2016 CLINICAL DATA:  Abnormal liver function. EXAM: ABDOMEN ULTRASOUND COMPLETE COMPARISON:  CT 08/14/2016 FINDINGS: Gallbladder: No gallstones or wall thickening visualized. No sonographic Murphy sign noted by sonographer. Common bile duct: Diameter: Normal caliber, 5 mm Liver: No focal lesion identified. Within normal limits in parenchymal echogenicity. IVC: No abnormality visualized. Pancreas: Visualized portion unremarkable. Spleen: Size and appearance within normal limits. Right Kidney: Length: 11.6 cm. Multiple cysts, the largest 6.1 cm. No hydronephrosis. Normal echotexture. Left Kidney: Length: 10.6 cm. Multiple cysts, the largest 4.7 cm. No hydronephrosis. Normal echotexture. Abdominal aorta: No aneurysm visualized. Other findings: None. IMPRESSION: No acute findings. Bilateral renal cysts. Electronically Signed   By: Rolm Baptise M.D.   On: 08/17/2016 08:22   Dg Hand 2 View Right  Result Date: 08/31/2016 CLINICAL DATA:  Third MCP pain. EXAM: RIGHT HAND - 2 VIEW COMPARISON:  None. FINDINGS: There is no evidence of fracture or dislocation. There is no evidence of arthropathy or other focal bone abnormality. Soft tissues are unremarkable. IMPRESSION: No cause for the patient's third MCP pain identified. Scattered degenerative changes. Electronically Signed   By: Dorise Bullion III M.D   On: 08/31/2016 18:22   Nm Pet Image Restag (ps) Skull Base To Thigh  Result Date: 08/14/2016 CLINICAL DATA:  Subsequent treatment strategy for lower esophageal malignancy. EXAM: NUCLEAR MEDICINE PET SKULL BASE TO THIGH TECHNIQUE: 6.6 mCi F-18 FDG was injected intravenously. Full-ring PET imaging was performed from the skull base to thigh after the radiotracer. CT data was obtained and used for attenuation correction and anatomic localization. FASTING BLOOD GLUCOSE:  Value: 206 mg/dl COMPARISON:  12/05/2015 FINDINGS: NECK No hypermetabolic lymph nodes in the neck. CHEST Esophagectomy with gastric pull-through. Moderate to large left pleural effusion with passive atelectasis. No hypermetabolic activity to suggest recurrent or metastatic disease in the chest at this time. ABDOMEN/PELVIS Physiologic activity in the bowel. No metastatic lesion in the liver is identified. A left adrenal mass previously measured 1.4 by 1.7 cm on 12/01/2015 and currently measures 1.4 by 1.6 cm on image 109/4. Internal density 15 Hounsfield units, appearance not specific for adenoma on prior MRI from 04/10/2016. This small mass has hypermetabolic activity, maximum SUV 6.2, previously 4.2 back on 12/05/2015. Bilateral photopenic renal cysts. Left-sided percutaneous jejunostomy. Enlarged prostate gland. There is evidence of bilateral inguinal hernia repairs in the past. No hypermetabolic liver lesions. SKELETON No focal hypermetabolic activity to suggest skeletal metastasis. IMPRESSION: 1. Small hypermetabolic left adrenal mass, this has not changed in size compared to 12/01/15 but has a maximum standard uptake value of 6.2 (previously 4.2). Indeterminate density and MRI characteristics, not specific for adenoma. Early metastatic disease is not readily excluded although the lack of change of size is somewhat reassuring. Surveillance recommended. 2. No other hypermetabolic lesions are identified. 3. Moderate to large left  pleural effusion with passive atelectasis. 4. Esophagectomy with gastric pull-through. 5. No hypermetabolic liver lesions are seen. 6. Enlarged prostate gland. Electronically Signed   By: Van Clines M.D.   On: 08/14/2016 09:46   Ir US Guide Vasc Access Right  Result Date: 08/31/2016  INDICATION: 76 year old male with anemia. He has been referred for port catheter placement. EXAM: IMPLANTED PORT A CATH PLACEMENT WITH ULTRASOUND AND FLUOROSCOPIC GUIDANCE MEDICATIONS: 2 g Ancef; The antibiotic was administered within an appropriate time interval prior to skin puncture. ANESTHESIA/SEDATION: Moderate (conscious) sedation was employed during this procedure. A total of Versed 2.0 mg and Fentanyl 100 mcg was administered intravenously. Moderate Sedation Time: 25 minutes. The patient's level of consciousness and vital signs were monitored continuously by radiology nursing throughout the procedure under my direct supervision. FLUOROSCOPY TIME:  Zero minutes, 6 seconds COMPLICATIONS: None PROCEDURE: The procedure, risks, benefits, and alternatives were explained to the patient. Questions regarding the procedure were encouraged and answered. The patient understands and consents to the procedure. Ultrasound survey was performed with images stored and sent to PACs. The right neck and chest was prepped with chlorhexidine, and draped in the usual sterile fashion using maximum barrier technique (cap and mask, sterile gown, sterile gloves, large sterile sheet, hand hygiene and cutaneous antiseptic). Antibiotic prophylaxis was provided with 2.0g Ancef administered IV one hour prior to skin incision. Local anesthesia was attained by infiltration with 1% lidocaine without epinephrine. Ultrasound demonstrated patency of the right internal jugular vein, and this was documented with an image. Under real-time ultrasound guidance, this vein was accessed with a 21 gauge micropuncture needle and image documentation was performed. A  small dermatotomy was made at the access site with an 11 scalpel. A 0.018" wire was advanced into the SVC and used to estimate the length of the internal catheter. The access needle exchanged for a 29F micropuncture vascular sheath. The 0.018" wire was then removed and a 0.035" wire advanced into the IVC. An appropriate location for the subcutaneous reservoir was selected below the clavicle and an incision was made through the skin and underlying soft tissues. The subcutaneous tissues were then dissected using a combination of blunt and sharp surgical technique and a pocket was formed. Superficial artery was encountered, requiring suture ligation. A single lumen power injectable portacatheter was then tunneled through the subcutaneous tissues from the pocket to the dermatotomy and the port reservoir placed within the subcutaneous pocket. The venous access site was then serially dilated and a peel away vascular sheath placed over the wire. The wire was removed and the port catheter advanced into position under fluoroscopic guidance. The catheter tip is positioned in the cavoatrial junction. This was documented with a spot image. The portacatheter was then tested and found to flush and aspirate well. The port was flushed with saline followed by 100 units/mL heparinized saline. The pocket was then closed in two layers using first subdermal inverted interrupted absorbable sutures followed by a running subcuticular suture. The epidermis was then sealed with Dermabond. The dermatotomy at the venous access site was also seal with Dermabond. Patient tolerated the procedure well and remained hemodynamically stable throughout. No complications encountered and no significant blood loss encountered IMPRESSION: Status post right IJ port catheter placement. Catheter ready for use. Signed, Dulcy Fanny. Earleen Newport, DO [facility] Electronically Signed   By: Corrie Mckusick D.O.   On:  08/31/2016 17:10   Ct Biopsy  Result Date: 08/31/2016 INDICATION: History of esophageal cancer and possible DIC. Concern for metastatic disease to the bone marrow versus primary bone marrow dysfunction. EXAM: CT GUIDED BONE MARROW ASPIRATION AND CORE BIOPSY Interventional Radiologist:  Criselda Peaches, MD MEDICATIONS: None. ANESTHESIA/SEDATION: Moderate (conscious) sedation was employed during this procedure. A total of 2 milligrams versed  and 50 micrograms fentanyl were administered intravenously. The patient's level of consciousness and vital signs were monitored continuously by radiology nursing throughout the procedure under my direct supervision. Total monitored sedation time: 10 minutes FLUOROSCOPY TIME:  Fluoroscopy Time: 0 minutes 0 seconds (0 mGy). COMPLICATIONS: None immediate. Estimated blood loss: <25 mL PROCEDURE: Informed written consent was obtained from the patient after a thorough discussion of the procedural risks, benefits and alternatives. All questions were addressed. Maximal Sterile Barrier Technique was utilized including caps, mask, sterile gowns, sterile gloves, sterile drape, hand hygiene and skin antiseptic. A timeout was performed prior to the initiation of the procedure. The patient was positioned prone and non-contrast localization CT was performed of the pelvis to demonstrate the iliac marrow spaces. Maximal barrier sterile technique utilized including caps, mask, sterile gowns, sterile gloves, large sterile drape, hand hygiene, and betadine prep. Under sterile conditions and local anesthesia, an 11 gauge coaxial bone biopsy needle was advanced into the right iliac marrow space. Needle position was confirmed with CT imaging. Initially, bone marrow aspiration was performed. Next, the 11 gauge outer cannula was utilized to obtain a right iliac bone marrow core biopsy. Needle was removed. Hemostasis was obtained with compression. The patient tolerated the procedure well. Samples  were prepared with the cytotechnologist. IMPRESSION: Technically successful right-sided bone marrow biopsy. Electronically Signed   By: Jacqulynn Cadet M.D.   On: 08/31/2016 17:43   Dg Chest Port 1 View  Result Date: 08/28/2016 CLINICAL DATA:  Pleural effusions. Previous smoker. History of esophageal cancer and hypertension. EXAM: PORTABLE CHEST 1 VIEW COMPARISON:  08/22/2016 FINDINGS: Small left pleural effusion is increasing in size since previous study. Atelectasis in the left lung base. Right lung is clear. Emphysematous changes bilaterally. Heart size and pulmonary vascularity are normal. Degenerative changes in the spine and shoulders. IMPRESSION: Small but increasing left pleural effusion and basilar atelectasis. Emphysematous changes in the lungs. Electronically Signed   By: Lucienne Capers M.D.   On: 08/28/2016 22:50   Dg Chest Port 1 View  Result Date: 08/16/2016 CLINICAL DATA:  76 y/o  M; fever and history of esophageal cancer. EXAM: PORTABLE CHEST 1 VIEW COMPARISON:  08/02/2016 chest radiograph and 08/14/2016 PET-CT. FINDINGS: The heart size and mediastinal contours are within normal limits and stable. Moderate left pleural effusion is increased from prior radiograph with probably stable from prior PET-CT with redistribution to the lung base. Associated left basilar opacity. Otherwise no focal consolidation identified. The visualized skeletal structures are unremarkable. IMPRESSION: Moderate left pleural effusion increased from prior chest radiograph, probably stable from prior chest PET-CT. She fusion of the lung base. Associated left basilar opacity probably represents atelectasis, underline pneumonia is not excluded. Electronically Signed   By: Kristine Garbe M.D.   On: 08/16/2016 19:14   Ct Angio Chest/abd/pel For Dissection W And/or W/wo  Result Date: 08/22/2016 CLINICAL DATA:  History of esophageal cancer. Pleural effusion. Rule out aneurysm or dissection. EXAM: CT  ANGIOGRAPHY CHEST, ABDOMEN AND PELVIS TECHNIQUE: Multidetector CT imaging through the chest, abdomen and pelvis was performed using the standard protocol during bolus administration of intravenous contrast. Multiplanar reconstructed images and MIPs were obtained and reviewed to evaluate the vascular anatomy. CONTRAST:  100 cc Isovue 370 IV COMPARISON:  PET CT 08/14/2016 FINDINGS: CTA CHEST FINDINGS Cardiovascular: Aorta is normal caliber. No dissection. Heart is borderline in size. No filling defects in the pulmonary arteries to suggest pulmonary emboli. Mediastinum/Nodes: Changes of prior esophagectomy with gastric pull-through, stable appearance. No mediastinal, hilar, or  axillary adenopathy. Lungs/Pleura: Large left pleural effusion with compressive atelectasis in the left lower lobe, unchanged. Right lung is clear. Trace right pleural effusion. Musculoskeletal: No acute bony abnormality. Review of the MIP images confirms the above findings. CTA ABDOMEN AND PELVIS FINDINGS VASCULAR Aorta: Normal caliber.  No dissection. Celiac: Widely patent SMA: Widely patent Renals: Single bilaterally, widely patent IMA: Widely patent Inflow: Widely patent Veins: Grossly unremarkable. Review of the MIP images confirms the above findings. NON-VASCULAR Hepatobiliary: No focal hepatic abnormality. Gallbladder unremarkable. Pancreas: Mildly atrophic pancreas. No focal abnormality or ductal dilatation. Spleen: No focal abnormality.  Normal size. Adrenals/Urinary Tract: Small nodule again noted in the left adrenal gland, shown to be hypermetabolic on prior PET CT. This measures approximately 17 mm, unchanged. Right adrenal gland is unremarkable. Bilateral renal cysts. No hydronephrosis. Urinary bladder grossly unremarkable. Stomach/Bowel: Scattered sigmoid diverticulosis. No active diverticulitis. Stomach and small bowel decompressed. Jejunostomy feeding tube in place within the left small bowel loops. Lymphatic: No adenopathy.  Reproductive: Prostate enlargement with central calcifications. Other: No free fluid or free air. Musculoskeletal: No acute bony abnormality. Review of the MIP images confirms the above findings. IMPRESSION: No evidence of aortic aneurysm or dissection. No evidence of pulmonary embolus. Heart is borderline in size. Stable small left adrenal nodule which was hypermetabolic on prior PET CT. Stable large left pleural effusion with compressive atelectasis in the left lower lobe. Stable appearance of the gastric pull-through post esophagectomy. Electronically Signed   By: Rolm Baptise M.D.   On: 08/22/2016 12:30   Ir Fluoro Guide Port Insertion Right  Result Date: 08/31/2016 INDICATION: 76 year old male with anemia. He has been referred for port catheter placement. EXAM: IMPLANTED PORT A CATH PLACEMENT WITH ULTRASOUND AND FLUOROSCOPIC GUIDANCE MEDICATIONS: 2 g Ancef; The antibiotic was administered within an appropriate time interval prior to skin puncture. ANESTHESIA/SEDATION: Moderate (conscious) sedation was employed during this procedure. A total of Versed 2.0 mg and Fentanyl 100 mcg was administered intravenously. Moderate Sedation Time: 25 minutes. The patient's level of consciousness and vital signs were monitored continuously by radiology nursing throughout the procedure under my direct supervision. FLUOROSCOPY TIME:  Zero minutes, 6 seconds COMPLICATIONS: None PROCEDURE: The procedure, risks, benefits, and alternatives were explained to the patient. Questions regarding the procedure were encouraged and answered. The patient understands and consents to the procedure. Ultrasound survey was performed with images stored and sent to PACs. The right neck and chest was prepped with chlorhexidine, and draped in the usual sterile fashion using maximum barrier technique (cap and mask, sterile gown, sterile gloves, large sterile sheet, hand hygiene and cutaneous antiseptic). Antibiotic prophylaxis was provided with 2.0g  Ancef administered IV one hour prior to skin incision. Local anesthesia was attained by infiltration with 1% lidocaine without epinephrine. Ultrasound demonstrated patency of the right internal jugular vein, and this was documented with an image. Under real-time ultrasound guidance, this vein was accessed with a 21 gauge micropuncture needle and image documentation was performed. A small dermatotomy was made at the access site with an 11 scalpel. A 0.018" wire was advanced into the SVC and used to estimate the length of the internal catheter. The access needle exchanged for a 42F micropuncture vascular sheath. The 0.018" wire was then removed and a 0.035" wire advanced into the IVC. An appropriate location for the subcutaneous reservoir was selected below the clavicle and an incision was made through the skin and underlying soft tissues. The subcutaneous tissues were then dissected using a combination of blunt and sharp  surgical technique and a pocket was formed. Superficial artery was encountered, requiring suture ligation. A single lumen power injectable portacatheter was then tunneled through the subcutaneous tissues from the pocket to the dermatotomy and the port reservoir placed within the subcutaneous pocket. The venous access site was then serially dilated and a peel away vascular sheath placed over the wire. The wire was removed and the port catheter advanced into position under fluoroscopic guidance. The catheter tip is positioned in the cavoatrial junction. This was documented with a spot image. The portacatheter was then tested and found to flush and aspirate well. The port was flushed with saline followed by 100 units/mL heparinized saline. The pocket was then closed in two layers using first subdermal inverted interrupted absorbable sutures followed by a running subcuticular suture. The epidermis was then sealed with Dermabond. The dermatotomy at the venous access site was also seal with Dermabond.  Patient tolerated the procedure well and remained hemodynamically stable throughout. No complications encountered and no significant blood loss encountered IMPRESSION: Status post right IJ port catheter placement. Catheter ready for use. Signed, Dulcy Fanny. Earleen Newport, DO [facility] Electronically Signed   By: Corrie Mckusick D.O.   On: 08/31/2016 17:10   US Abdomen Limited Ruq  Result Date: 08/30/2016 CLINICAL DATA:  Elevated liver function studies. History of esophageal malignancy, hyperlipidemia, former smoker. EXAM: US ABDOMEN LIMITED - RIGHT UPPER QUADRANT COMPARISON:  Ultrasound of August 16, 2016 FINDINGS: Gallbladder: No gallstones or wall thickening visualized. No sonographic Murphy sign noted by sonographer. Common bile duct: Diameter: 5.4 mm Liver: The hepatic echotexture is normal. In the right hepatic lobe posteriorly there is a hyperechoic focus measuring 1.4 x 1.0 x 1.0 cm most compatible with a hemangioma. This is similar in appearance to a hypoechoic liver focus on the March 20, 2016 abdominal CT scan. 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 No acute abnormality within the liver is observed. Normal appearance of the liver and common bile duct. Electronically Signed   By: David  Martinique M.D.   On: 08/30/2016 08:33   US Thoracentesis Asp Pleural Space W/img Guide  Result Date: 08/22/2016 INDICATION: Esophageal cancer, recurrent left pleural effusion. Request made for diagnostic and therapeutic left thoracentesis. EXAM: ULTRASOUND GUIDED DIAGNOSTIC AND THERAPEUTIC LEFT THORACENTESIS MEDICATIONS: None. COMPLICATIONS: None immediate. PROCEDURE: An ultrasound guided thoracentesis was thoroughly discussed with the patient and questions answered. The benefits, risks, alternatives and complications were also discussed. The patient understands and wishes to proceed with the procedure. Written consent was obtained. Ultrasound was  performed to localize and mark an adequate pocket of fluid in the left chest. The area was then prepped and draped in the normal sterile fashion. 1% Lidocaine was used for local anesthesia. Under ultrasound guidance a Safe-T-Centesis catheter was introduced. Thoracentesis was performed. The catheter was removed and a dressing applied. FINDINGS: A total of approximately 1 liter of yellow fluid was removed. Samples were sent to the laboratory as requested by the clinical team. IMPRESSION: Successful ultrasound guided diagnostic and therapeutic left thoracentesis yielding 1 liter of pleural fluid. Read by: Rowe Robert, PA-C Electronically Signed   By: Sandi Mariscal M.D.   On: 08/22/2016 11:38   US Thoracentesis Asp Pleural Space W/img Guide  Result Date: 08/02/2016 INDICATION: Patient with history of esophageal cancer, recurrent left pleural effusion. Request made for diagnostic and therapeutic left thoracentesis. EXAM: ULTRASOUND GUIDED DIAGNOSTIC AND THERAPEUTIC LEFT THORACENTESIS MEDICATIONS: None. COMPLICATIONS: None immediate. PROCEDURE: An ultrasound guided thoracentesis was thoroughly discussed  with the patient and questions answered. The benefits, risks, alternatives and complications were also discussed. The patient understands and wishes to proceed with the procedure. Written consent was obtained. Ultrasound was performed to localize and mark an adequate pocket of fluid in the left chest. The area was then prepped and draped in the normal sterile fashion. 1% Lidocaine was used for local anesthesia. Under ultrasound guidance a Safe-T-Centesis catheter was introduced. Thoracentesis was performed. The catheter was removed and a dressing applied. FINDINGS: A total of approximately 900 cc of yellow fluid was removed. Samples were sent to the laboratory as requested by the clinical team. IMPRESSION: Successful ultrasound guided diagnostic and therapeutic left thoracentesis yielding 900 cc of pleural fluid. Read  by: Rowe Robert, PA-C Electronically Signed   By: Aletta Edouard M.D.   On: 08/02/2016 10:07       Discharge Exam: Vitals:   08/31/16 2008 09/01/16 0433  BP: 128/60 112/64  Pulse: 81 100  Resp: 20 20  Temp: 98.1 F (36.7 C) 98.4 F (36.9 C)   Vitals:   08/31/16 1655 08/31/16 1720 08/31/16 2008 09/01/16 0433  BP: 125/64 131/65 128/60 112/64  Pulse: 97 91 81 100  Resp: (!) 24 18 20 20   Temp:   98.1 F (36.7 C) 98.4 F (36.9 C)  TempSrc:   Oral Oral  SpO2: 100% 98% 99% 99%  Weight:      Height:        General: Pt is alert, awake, not in acute distress Cardiovascular: RRR, S1/S2 +, no rubs, no gallops Respiratory: CTA bilaterally, no wheezing, no rhonchi Abdominal: Soft, NT, ND, bowel sounds + Extremities: no edema, no cyanosis    The results of significant diagnostics from this hospitalization (including imaging, microbiology, ancillary and laboratory) are listed below for reference.     Microbiology: Recent Results (from the past 240 hour(s))  Culture, body fluid-bottle     Status: None   Collection Time: 08/22/16 11:22 AM  Result Value Ref Range Status   Specimen Description PLEURAL LEFT  Final   Special Requests NONE  Final   Culture   Final    NO GROWTH 5 DAYS Performed at [facility], [address], [place], [place] [address]    Report Status 08/27/2016 FINAL  Final  Gram stain     Status: None   Collection Time: 08/22/16 11:22 AM  Result Value Ref Range Status   Specimen Description FLUID LEFT PLEURAL  Final   Special Requests NONE  Final   Gram Stain   Final    RARE WBC PRESENT, PREDOMINANTLY MONONUCLEAR NO ORGANISMS SEEN Performed at [facility], [place] [address] [place], [place] [address]    Report Status 08/23/2016 FINAL  Final  TECHNOLOGIST REVIEW     Status: None   Collection Time: 08/27/16  9:12 AM  Result Value Ref Range Status   Technologist Review   Final    Metas and Myelocytes present, 5% nRBCs, moderate helmets, few  spherocytes, sl basophilic stippling     Labs: BNP (last 3 results)  Recent Labs  05/03/16 0256  BNP 97.4   Basic Metabolic Panel:  Recent Labs Lab 08/27/16 0912 08/28/16 1950 08/30/16 0533 08/31/16 0508 08/31/16 1342  NA 133* 136 139 137 138  K 4.7 4.7 3.9 4.0 4.5  CL  --  101 106 105 108  CO2 23 28 26 24 24   GLUCOSE 190* 114* 109* 109* 106*  BUN 24.2 21* 16 13 14  CREATININE 0.7 0.58* 0.52* 0.48* 0.58*  CALCIUM 9.0 8.5* 8.2* 8.3* 8.1*   Liver Function Tests:  Recent Labs Lab 08/27/16 0912 08/31/16 1342  AST 105* 44*  ALT 37 27  ALKPHOS 520* 328*  BILITOT 1.61* 1.5*  PROT 6.1* 5.4*  ALBUMIN 3.3* 3.0*   No results for input(s): LIPASE, AMYLASE in the last 168 hours. No results for input(s): AMMONIA in the last 168 hours. CBC:  Recent Labs Lab 08/27/16 0912  08/28/16 1950 08/29/16 0539 08/30/16 0533 08/30/16 1755 08/31/16 0508 09/01/16 0505  WBC 13.1*  --  6.7 6.1 4.2  --  4.5 4.6  NEUTROABS 9.1*  --   --   --   --   --  3.0  --   HGB 7.2*  < > 8.1* 7.7* 7.3* 9.7* 9.6* 8.8*  HCT 21.6*  < > 23.9* 22.4* 21.0* 29.0* 27.9* 25.8*  MCV 92.3  --  86.0 89.6 88.2  --  87.2 89.3  PLT 84*  --  52*  59* 58* 50*  --  51* 54*  < > = values in this interval not displayed. Cardiac Enzymes: No results for input(s): CKTOTAL, CKMB, CKMBINDEX, TROPONINI in the last 168 hours. BNP: Invalid input(s): POCBNP CBG: No results for input(s): GLUCAP in the last 168 hours. D-Dimer  Recent Labs  08/31/16 0508  DDIMER >20.00*   Hgb A1c No results for input(s): HGBA1C in the last 72 hours. Lipid Profile No results for input(s): CHOL, HDL, LDLCALC, TRIG, CHOLHDL, LDLDIRECT in the last 72 hours. Thyroid function studies No results for input(s): TSH, T4TOTAL, T3FREE, THYROIDAB in the last 72 hours.  Invalid input(s): FREET3 Anemia work up No results for input(s): VITAMINB12, FOLATE, FERRITIN, TIBC, IRON, RETICCTPCT in the last 72 hours. Urinalysis    Component  Value Date/Time   COLORURINE AMBER (A) 08/16/2016 0429   APPEARANCEUR CLEAR 08/16/2016 0429   LABSPEC 1.025 08/16/2016 0429   LABSPEC 1.010 08/15/2016 1224   PHURINE 5.0 08/16/2016 0429   GLUCOSEU NEGATIVE 08/16/2016 0429   GLUCOSEU Negative 08/15/2016 1224   HGBUR NEGATIVE 08/16/2016 0429   BILIRUBINUR NEGATIVE 08/16/2016 0429   BILIRUBINUR Negative 08/15/2016 1224   KETONESUR NEGATIVE 08/16/2016 0429   PROTEINUR 30 (A) 08/16/2016 0429   UROBILINOGEN 0.2 08/15/2016 1224   NITRITE NEGATIVE 08/16/2016 0429   LEUKOCYTESUR NEGATIVE 08/16/2016 0429   LEUKOCYTESUR Negative 08/15/2016 1224   Sepsis Labs Invalid input(s): PROCALCITONIN,  WBC,  LACTICIDVEN Microbiology Recent Results (from the past 240 hour(s))  Culture, body fluid-bottle     Status: None   Collection Time: 08/22/16 11:22 AM  Result Value Ref Range Status   Specimen Description PLEURAL LEFT  Final   Special Requests NONE  Final   Culture   Final    NO GROWTH 5 DAYS Performed at [facility], [address], [place], [place] [address]    Report Status 08/27/2016 FINAL  Final  Gram stain     Status: None   Collection Time: 08/22/16 11:22 AM  Result Value Ref Range Status   Specimen Description FLUID LEFT PLEURAL  Final   Special Requests NONE  Final   Gram Stain   Final    RARE WBC PRESENT, PREDOMINANTLY MONONUCLEAR NO ORGANISMS SEEN Performed at [facility], [place] [address], [place], [place] [address]    Report Status 08/23/2016 FINAL  Final  TECHNOLOGIST REVIEW     Status: None   Collection Time: 08/27/16  9:12 AM  Result Value Ref Range  Status   Technologist Review   Final    Metas and Myelocytes present, 5% nRBCs, moderate helmets, few spherocytes, sl basophilic stippling     Time coordinating discharge: Over 30 minutes  SIGNED:   Debbe Odea, MD  [facility] 09/01/2016, 10:01 AM Pager   If 7PM-7AM, please contact night-coverage [email or web address] Password [email or web address]

## 2016-09-02 NOTE — Care Management Note (Signed)
Case Management Note  Patient Details  Name: Fred [name] MRN: [id number] Date of Birth: Jan 06, 1941  Subjective/Objective:  Melena, Malignant Neoplasm of lower third of esophagus, anemia                  Action/Plan: Discharge Planning: AVS reviewed:  NCM spoke to pt and wife via phone. Pt declines HH at this time. States he is able to do feedings at night with assistance of his wife. [facility] delivers Osmolite to the home. Arranged HH with [facility]. Contacted Liaison to cancel referral.   PCP [name]   Expected Discharge Date:  09/01/16               Expected Discharge Plan:  [place]  In-House Referral:  NA  Discharge planning Services  CM Consult  Post Acute Care Choice:  Home Health Choice offered to:  Patient  DME Arranged:  N/A DME Agency:  NA  HH Arranged:  RN [place] Agency:  [facility] [employer]  Status of Service:  Completed, signed off  If discussed at [employer], dates discussed:    Additional Comments:  Erenest Rasher, RN 09/02/2016, 2:52 PM

## 2016-09-03 ENCOUNTER — Telehealth: Payer: Self-pay

## 2016-09-03 ENCOUNTER — Ambulatory Visit (HOSPITAL_COMMUNITY)
Admission: RE | Admit: 2016-09-03 | Discharge: 2016-09-03 | Disposition: A | Discharge status: Home / Self Care | Payer: Medicare Other | Source: Ambulatory Visit | Attending: Hematology | Admitting: Hematology

## 2016-09-03 DIAGNOSIS — D599 Acquired hemolytic anemia, unspecified: Secondary | ICD-10-CM | POA: Diagnosis not present

## 2016-09-03 DIAGNOSIS — C155 Malignant neoplasm of lower third of esophagus: Secondary | ICD-10-CM | POA: Diagnosis not present

## 2016-09-03 DIAGNOSIS — D65 Disseminated intravascular coagulation [defibrination syndrome]: Secondary | ICD-10-CM | POA: Diagnosis not present

## 2016-09-03 LAB — GLUCOSE 6 PHOSPHATE DEHYDROGENASE
G6PDH: 9.4 U/g{Hb} (ref 4.6–13.5)
Hemoglobin: 7.2 g/dL — ABNORMAL LOW (ref 13.0–17.7)

## 2016-09-03 LAB — PREPARE RBC (CROSSMATCH)

## 2016-09-03 LAB — HEMATOLOGY COMMENTS:

## 2016-09-03 LAB — BONE MARROW EXAM

## 2016-09-03 MED ORDER — SODIUM CHLORIDE 0.9 % IV SOLN
250.0000 mL | Freq: Once | INTRAVENOUS | Status: AC
  Administered 2016-09-03: 250 mL via INTRAVENOUS

## 2016-09-03 MED ORDER — ACETAMINOPHEN 325 MG PO TABS
650.0000 mg | ORAL_TABLET | Freq: Once | ORAL | Status: AC
  Administered 2016-09-03: 650 mg via ORAL
  Filled 2016-09-03: qty 2

## 2016-09-03 MED ORDER — HEPARIN SOD (PORK) LOCK FLUSH 100 UNIT/ML IV SOLN: 500.0000 [IU] | Freq: Once | INTRAVENOUS | Status: DC

## 2016-09-03 MED ORDER — SODIUM CHLORIDE 0.9% FLUSH: 10.0000 mL | Freq: Once | INTRAVENOUS | Status: DC

## 2016-09-03 MED ORDER — SODIUM CHLORIDE 0.9 % IV SOLN
Freq: Once | INTRAVENOUS | Status: AC
  Administered 2016-09-03: 11:00:00 via INTRAVENOUS

## 2016-09-03 NOTE — Telephone Encounter (Signed)
Wife called to clarify pt appt. She asked for T&H on 16th in case hgb is low before the weekend. Lab added.

## 2016-09-03 NOTE — Progress Notes (Addendum)
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 Patient alert, oriented, and ambulatory at time of discharge. Discharge instructions given to patient and patient states an understanding.

## 2016-09-04 ENCOUNTER — Other Ambulatory Visit (HOSPITAL_BASED_OUTPATIENT_CLINIC_OR_DEPARTMENT_OTHER): Payer: Non-veteran care

## 2016-09-04 ENCOUNTER — Ambulatory Visit (HOSPITAL_BASED_OUTPATIENT_CLINIC_OR_DEPARTMENT_OTHER): Payer: Non-veteran care

## 2016-09-04 VITALS — BP 111/57 | HR 85 | Temp 98.8°F | Resp 16

## 2016-09-04 DIAGNOSIS — C159 Malignant neoplasm of esophagus, unspecified: Secondary | ICD-10-CM

## 2016-09-04 DIAGNOSIS — D65 Disseminated intravascular coagulation [defibrination syndrome]: Secondary | ICD-10-CM

## 2016-09-04 DIAGNOSIS — C155 Malignant neoplasm of lower third of esophagus: Secondary | ICD-10-CM

## 2016-09-04 DIAGNOSIS — D599 Acquired hemolytic anemia, unspecified: Secondary | ICD-10-CM

## 2016-09-04 DIAGNOSIS — Z5111 Encounter for antineoplastic chemotherapy: Secondary | ICD-10-CM | POA: Diagnosis not present

## 2016-09-04 DIAGNOSIS — R110 Nausea: Secondary | ICD-10-CM

## 2016-09-04 DIAGNOSIS — Z95828 Presence of other vascular implants and grafts: Secondary | ICD-10-CM

## 2016-09-04 LAB — COMPREHENSIVE METABOLIC PANEL
ALT: 21 U/L (ref 0–55)
ANION GAP: 9 meq/L (ref 3–11)
AST: 60 U/L — ABNORMAL HIGH (ref 5–34)
Albumin: 3.2 g/dL — ABNORMAL LOW (ref 3.5–5.0)
Alkaline Phosphatase: 452 U/L — ABNORMAL HIGH (ref 40–150)
BUN: 19 mg/dL (ref 7.0–26.0)
CHLORIDE: 101 meq/L (ref 98–109)
CO2: 26 meq/L (ref 22–29)
CREATININE: 0.6 mg/dL — AB (ref 0.7–1.3)
Calcium: 9 mg/dL (ref 8.4–10.4)
EGFR: >90 mL/min/{1.73_m2} (ref >90)
GLUCOSE: 115 mg/dL (ref 70–140)
Potassium: 4.8 mEq/L (ref 3.5–5.1)
SODIUM: 136 meq/L (ref 136–145)
TOTAL PROTEIN: 6.2 g/dL — AB (ref 6.4–8.3)
Total Bilirubin: 1.79 mg/dL — ABNORMAL HIGH (ref 0.20–1.20)

## 2016-09-04 LAB — CBC & DIFF AND RETIC
BASO%: 1.1 % (ref 0.0–2.0)
BASOS ABS: 0.1 10*3/uL (ref 0.0–0.1)
EOS ABS: 0.1 10*3/uL (ref 0.0–0.5)
EOS%: 1.6 % (ref 0.0–7.0)
HCT: 32.6 % — ABNORMAL LOW (ref 38.4–49.9)
HEMOGLOBIN: 11.2 g/dL — AB (ref 13.0–17.1)
IMMATURE RETIC FRACT: 29.4 % — AB (ref 3.00–10.60)
LYMPH%: 24.3 % (ref 14.0–49.0)
MCH: 30.1 pg (ref 27.2–33.4)
MCHC: 34.4 g/dL (ref 32.0–36.0)
MCV: 87.6 fL (ref 79.3–98.0)
MONO#: 0.4 10*3/uL (ref 0.1–0.9)
MONO%: 8.3 % (ref 0.0–14.0)
NEUT%: 64.7 % (ref 39.0–75.0)
NEUTROS ABS: 2.9 10*3/uL (ref 1.5–6.5)
NRBC: 10 % — AB (ref 0–0)
Platelets: 68 10*3/uL — ABNORMAL LOW (ref 140–400)
RBC: 3.72 10*6/uL — AB (ref 4.20–5.82)
RDW: 20.2 % — AB (ref 11.0–14.6)
Retic %: 7.82 % — ABNORMAL HIGH (ref 0.80–1.80)
Retic Ct Abs: 290.9 10*3/uL — ABNORMAL HIGH (ref 34.80–93.90)
WBC: 4.5 10*3/uL (ref 4.0–10.3)
lymph#: 1.1 10*3/uL (ref 0.9–3.3)

## 2016-09-04 LAB — TYPE AND SCREEN
ABO/RH(D): O POS
ANTIBODY SCREEN: NEGATIVE
UNIT DIVISION: 0
Unit division: 0

## 2016-09-04 LAB — ANTINUCLEAR ANTIBODIES, IFA

## 2016-09-04 LAB — LACTATE DEHYDROGENASE: LDH: 900 U/L — ABNORMAL HIGH (ref 125–245)

## 2016-09-04 LAB — TECHNOLOGIST REVIEW

## 2016-09-04 MED ORDER — LIDOCAINE-PRILOCAINE 2.5-2.5 % EX CREA: 1.0000 "application " | TOPICAL_CREAM | CUTANEOUS | 1 refills | Status: AC | PRN

## 2016-09-04 MED ORDER — DEXTROSE 5 % IV SOLN
Freq: Once | INTRAVENOUS | Status: AC
  Administered 2016-09-04: 14:00:00 via INTRAVENOUS

## 2016-09-04 MED ORDER — PROCHLORPERAZINE MALEATE 10 MG PO TABS: 10.0000 mg | ORAL_TABLET | Freq: Four times a day (QID) | ORAL | Status: DC | PRN

## 2016-09-04 MED ORDER — DEXAMETHASONE SODIUM PHOSPHATE 10 MG/ML IJ SOLN
INTRAMUSCULAR | Status: AC
  Filled 2016-09-04: qty 1

## 2016-09-04 MED ORDER — PALONOSETRON HCL INJECTION 0.25 MG/5ML
INTRAVENOUS | Status: AC
  Filled 2016-09-04: qty 5

## 2016-09-04 MED ORDER — PALONOSETRON HCL INJECTION 0.25 MG/5ML
0.2500 mg | Freq: Once | INTRAVENOUS | Status: AC
  Administered 2016-09-04: 0.25 mg via INTRAVENOUS

## 2016-09-04 MED ORDER — PROCHLORPERAZINE MALEATE 10 MG PO TABS: 10.0000 mg | ORAL_TABLET | Freq: Four times a day (QID) | ORAL | 0 refills | Status: DC | PRN

## 2016-09-04 MED ORDER — LEUCOVORIN CALCIUM INJECTION 350 MG
400.0000 mg/m2 | Freq: Once | INTRAVENOUS | Status: AC
  Administered 2016-09-04: 656 mg via INTRAVENOUS
  Filled 2016-09-04: qty 32.8

## 2016-09-04 MED ORDER — DEXAMETHASONE SODIUM PHOSPHATE 10 MG/ML IJ SOLN
10.0000 mg | Freq: Once | INTRAMUSCULAR | Status: AC
  Administered 2016-09-04: 10 mg via INTRAVENOUS

## 2016-09-04 MED ORDER — OXALIPLATIN CHEMO INJECTION 100 MG/20ML
60.0000 mg/m2 | Freq: Once | INTRAVENOUS | Status: AC
  Administered 2016-09-04: 100 mg via INTRAVENOUS
  Filled 2016-09-04: qty 20

## 2016-09-04 MED ORDER — SODIUM CHLORIDE 0.9 % IV SOLN
2000.0000 mg/m2 | INTRAVENOUS | Status: DC
  Administered 2016-09-04: 3300 mg via INTRAVENOUS
  Filled 2016-09-04: qty 66

## 2016-09-04 NOTE — Progress Notes (Signed)
Dr. Benay Spice reviewed today's labs and recent office notes. Advised ok to treat. Requested pharmacist review current orders to determine if there is a need for dose reduction. Pharmacy aware and advised ok to proceed with current orders as written. Patient notified and agrees to plan.

## 2016-09-04 NOTE — Patient Instructions (Signed)
[facility] Discharge Instructions for Patients Receiving Chemotherapy  Today you received the following chemotherapy agents Oxaliplatin, Leucovorin, and 5 FU  To help prevent nausea and vomiting after your treatment, we encourage you to take your nausea medication as directed If you develop nausea and vomiting that is not controlled by your nausea medication, call the clinic.   BELOW ARE SYMPTOMS THAT SHOULD BE REPORTED IMMEDIATELY:  *FEVER GREATER THAN 100.5 F  *CHILLS WITH OR WITHOUT FEVER  NAUSEA AND VOMITING THAT IS NOT CONTROLLED WITH YOUR NAUSEA MEDICATION  *UNUSUAL SHORTNESS OF BREATH  *UNUSUAL BRUISING OR BLEEDING  TENDERNESS IN MOUTH AND THROAT WITH OR WITHOUT PRESENCE OF ULCERS  *URINARY PROBLEMS  *BOWEL PROBLEMS  UNUSUAL RASH Items with * indicate a potential emergency and should be followed up as soon as possible.  Feel free to call the clinic you have any questions or concerns. The clinic phone number is [phone].  Please show the CHEMO ALERT CARD at check-in to the Emergency Department and triage nurse.   

## 2016-09-05 ENCOUNTER — Ambulatory Visit (HOSPITAL_COMMUNITY): Payer: No Typology Code available for payment source

## 2016-09-05 ENCOUNTER — Ambulatory Visit (HOSPITAL_COMMUNITY): Admission: RE | Admit: 2016-09-05 | Payer: No Typology Code available for payment source | Source: Ambulatory Visit

## 2016-09-05 LAB — FIBRINOGEN: FIBRINOGEN: 180 mg/dL — ABNORMAL LOW (ref 193–507)

## 2016-09-06 ENCOUNTER — Ambulatory Visit (HOSPITAL_BASED_OUTPATIENT_CLINIC_OR_DEPARTMENT_OTHER): Payer: No Typology Code available for payment source

## 2016-09-06 VITALS — BP 109/53 | HR 94 | Temp 98.2°F | Resp 18

## 2016-09-06 DIAGNOSIS — C155 Malignant neoplasm of lower third of esophagus: Secondary | ICD-10-CM | POA: Diagnosis not present

## 2016-09-06 LAB — PNH PROFILE (-HIGH SENSITIVITY): Viability:: 90

## 2016-09-06 MED ORDER — SODIUM CHLORIDE 0.9% FLUSH
10.0000 mL | INTRAVENOUS | Status: DC | PRN
  Administered 2016-09-06: 10 mL
  Filled 2016-09-06: qty 10

## 2016-09-06 MED ORDER — HEPARIN SOD (PORK) LOCK FLUSH 100 UNIT/ML IV SOLN
500.0000 [IU] | Freq: Once | INTRAVENOUS | Status: AC | PRN
  Administered 2016-09-06: 500 [IU]
  Filled 2016-09-06: qty 5

## 2016-09-07 ENCOUNTER — Telehealth: Payer: Self-pay

## 2016-09-07 ENCOUNTER — Other Ambulatory Visit (HOSPITAL_BASED_OUTPATIENT_CLINIC_OR_DEPARTMENT_OTHER): Payer: No Typology Code available for payment source

## 2016-09-07 DIAGNOSIS — D599 Acquired hemolytic anemia, unspecified: Secondary | ICD-10-CM

## 2016-09-07 DIAGNOSIS — C155 Malignant neoplasm of lower third of esophagus: Secondary | ICD-10-CM | POA: Diagnosis not present

## 2016-09-07 DIAGNOSIS — D65 Disseminated intravascular coagulation [defibrination syndrome]: Secondary | ICD-10-CM

## 2016-09-07 LAB — CBC & DIFF AND RETIC
BASO%: 0.5 % (ref 0.0–2.0)
BASOS ABS: 0 10*3/uL (ref 0.0–0.1)
EOS%: 0.5 % (ref 0.0–7.0)
Eosinophils Absolute: 0 10*3/uL (ref 0.0–0.5)
HEMATOCRIT: 29.1 % — AB (ref 38.4–49.9)
HEMOGLOBIN: 9.9 g/dL — AB (ref 13.0–17.1)
IMMATURE RETIC FRACT: 4.6 % (ref 3.00–10.60)
LYMPH%: 31.4 % (ref 14.0–49.0)
MCH: 30.3 pg (ref 27.2–33.4)
MCHC: 34 g/dL (ref 32.0–36.0)
MCV: 89 fL (ref 79.3–98.0)
MONO#: 0.1 10*3/uL (ref 0.1–0.9)
MONO%: 3.1 % (ref 0.0–14.0)
NEUT#: 2.5 10*3/uL (ref 1.5–6.5)
NEUT%: 64.5 % (ref 39.0–75.0)
PLATELETS: 95 10*3/uL — AB (ref 140–400)
RBC: 3.27 10*6/uL — AB (ref 4.20–5.82)
RDW: 19.6 % — ABNORMAL HIGH (ref 11.0–14.6)
Retic %: 4.95 % — ABNORMAL HIGH (ref 0.80–1.80)
Retic Ct Abs: 161.87 10*3/uL — ABNORMAL HIGH (ref 34.80–93.90)
WBC: 3.9 10*3/uL — ABNORMAL LOW (ref 4.0–10.3)
lymph#: 1.2 10*3/uL (ref 0.9–3.3)

## 2016-09-07 LAB — LACTATE DEHYDROGENASE: LDH: 782 U/L — AB (ref 125–245)

## 2016-09-07 LAB — CEA (IN HOUSE-CHCC): CEA (CHCC-In House): 2612.34 ng/mL — ABNORMAL HIGH (ref 0.00–5.00)

## 2016-09-07 LAB — TECHNOLOGIST REVIEW

## 2016-09-07 NOTE — Telephone Encounter (Signed)
Wife called to make sure there is still a type and hold in place. Pt is getting weak and she is worried he may need some blood. Will let Myrtle RN know he is coming in today at 1115 for labs.

## 2016-09-08 LAB — FIBRINOGEN: FIBRINOGEN: 127 mg/dL — AB (ref 193–507)

## 2016-09-08 LAB — CEA

## 2016-09-10 ENCOUNTER — Other Ambulatory Visit: Payer: Self-pay

## 2016-09-10 DIAGNOSIS — C155 Malignant neoplasm of lower third of esophagus: Secondary | ICD-10-CM

## 2016-09-11 ENCOUNTER — Ambulatory Visit (HOSPITAL_BASED_OUTPATIENT_CLINIC_OR_DEPARTMENT_OTHER): Payer: Non-veteran care

## 2016-09-11 ENCOUNTER — Other Ambulatory Visit (HOSPITAL_BASED_OUTPATIENT_CLINIC_OR_DEPARTMENT_OTHER): Payer: Non-veteran care

## 2016-09-11 ENCOUNTER — Ambulatory Visit: Payer: Non-veteran care

## 2016-09-11 ENCOUNTER — Ambulatory Visit (HOSPITAL_BASED_OUTPATIENT_CLINIC_OR_DEPARTMENT_OTHER): Payer: Non-veteran care | Admitting: Nurse Practitioner

## 2016-09-11 ENCOUNTER — Ambulatory Visit (HOSPITAL_COMMUNITY)
Admission: RE | Admit: 2016-09-11 | Discharge: 2016-09-11 | Disposition: A | Discharge status: Home / Self Care | Payer: Non-veteran care | Source: Ambulatory Visit | Attending: Nurse Practitioner | Admitting: Nurse Practitioner

## 2016-09-11 VITALS — BP 101/50 | HR 96 | Resp 18

## 2016-09-11 VITALS — BP 93/57 | HR 103 | Temp 98.5°F | Resp 18 | Ht 65.0 in | Wt 130.9 lb

## 2016-09-11 DIAGNOSIS — C155 Malignant neoplasm of lower third of esophagus: Secondary | ICD-10-CM | POA: Diagnosis not present

## 2016-09-11 DIAGNOSIS — D65 Disseminated intravascular coagulation [defibrination syndrome]: Secondary | ICD-10-CM

## 2016-09-11 DIAGNOSIS — I959 Hypotension, unspecified: Secondary | ICD-10-CM | POA: Diagnosis not present

## 2016-09-11 DIAGNOSIS — J90 Pleural effusion, not elsewhere classified: Secondary | ICD-10-CM | POA: Insufficient documentation

## 2016-09-11 DIAGNOSIS — J9811 Atelectasis: Secondary | ICD-10-CM | POA: Diagnosis not present

## 2016-09-11 DIAGNOSIS — R0602 Shortness of breath: Secondary | ICD-10-CM

## 2016-09-11 DIAGNOSIS — D649 Anemia, unspecified: Secondary | ICD-10-CM | POA: Diagnosis not present

## 2016-09-11 DIAGNOSIS — D599 Acquired hemolytic anemia, unspecified: Secondary | ICD-10-CM

## 2016-09-11 DIAGNOSIS — C19 Malignant neoplasm of rectosigmoid junction: Secondary | ICD-10-CM

## 2016-09-11 LAB — CBC & DIFF AND RETIC
BASO%: 1.2 % (ref 0.0–2.0)
Basophils Absolute: 0.1 10*3/uL (ref 0.0–0.1)
EOS ABS: 0.1 10*3/uL (ref 0.0–0.5)
EOS%: 2.9 % (ref 0.0–7.0)
HEMATOCRIT: 26.9 % — AB (ref 38.4–49.9)
HGB: 9 g/dL — ABNORMAL LOW (ref 13.0–17.1)
Immature Retic Fract: 46.9 % — ABNORMAL HIGH (ref 3.00–10.60)
LYMPH%: 23 % (ref 14.0–49.0)
MCH: 30.2 pg (ref 27.2–33.4)
MCHC: 33.5 g/dL (ref 32.0–36.0)
MCV: 90.3 fL (ref 79.3–98.0)
MONO#: 0.7 10*3/uL (ref 0.1–0.9)
MONO%: 14.7 % — ABNORMAL HIGH (ref 0.0–14.0)
NEUT%: 58.2 % (ref 39.0–75.0)
NEUTROS ABS: 2.9 10*3/uL (ref 1.5–6.5)
Platelets: 159 10*3/uL (ref 140–400)
RBC: 2.98 10*6/uL — ABNORMAL LOW (ref 4.20–5.82)
RDW: 20 % — ABNORMAL HIGH (ref 11.0–14.6)
Retic %: 7.1 % — ABNORMAL HIGH (ref 0.80–1.80)
Retic Ct Abs: 211.58 10*3/uL — ABNORMAL HIGH (ref 34.80–93.90)
WBC: 4.9 10*3/uL (ref 4.0–10.3)
lymph#: 1.1 10*3/uL (ref 0.9–3.3)
nRBC: 10 % — ABNORMAL HIGH (ref 0–0)

## 2016-09-11 LAB — LACTATE DEHYDROGENASE: LDH: 490 U/L — AB (ref 125–245)

## 2016-09-11 LAB — CHROMOSOME ANALYSIS, BONE MARROW

## 2016-09-11 LAB — TECHNOLOGIST REVIEW

## 2016-09-11 MED ORDER — SODIUM CHLORIDE 0.9 % IV SOLN
Freq: Once | INTRAVENOUS | Status: DC
  Administered 2016-09-11: 12:00:00 via INTRAVENOUS

## 2016-09-11 MED ORDER — HEPARIN SOD (PORK) LOCK FLUSH 100 UNIT/ML IV SOLN
500.0000 [IU] | Freq: Once | INTRAVENOUS | Status: AC
  Administered 2016-09-11: 500 [IU] via INTRAVENOUS
  Filled 2016-09-11: qty 5

## 2016-09-11 MED ORDER — OSMOLITE 1.5 CAL PO LIQD: ORAL | 0 refills | Status: DC

## 2016-09-11 MED ORDER — SODIUM CHLORIDE 0.9% FLUSH
10.0000 mL | INTRAVENOUS | Status: DC | PRN
  Administered 2016-09-11: 10 mL via INTRAVENOUS
  Filled 2016-09-11: qty 10

## 2016-09-11 NOTE — Patient Instructions (Signed)
Dehydration, Adult Dehydration is a condition in which there is not enough fluid or water in the body. This happens when you lose more fluids than you take in. Important organs, such as the kidneys, brain, and heart, cannot function without a proper amount of fluids. Any loss of fluids from the body can lead to dehydration. Dehydration can range from mild to severe. This condition should be treated right away to prevent it from becoming severe. What are the causes? This condition may be caused by:  Vomiting.  Diarrhea.  Excessive sweating, such as from heat exposure or exercise.  Not drinking enough fluid, especially:  When ill.  While doing activity that requires a lot of energy.  Excessive urination.  Fever.  Infection.  Certain medicines, such as medicines that cause the body to lose excess fluid (diuretics).  Inability to access safe drinking water.  Reduced physical ability to get adequate water and food. What increases the risk? This condition is more likely to develop in people:  Who have a poorly controlled long-term (chronic) illness, such as diabetes, heart disease, or kidney disease.  Who are age 65 or older.  Who are disabled.  Who live in a place with high altitude.  Who play endurance sports. What are the signs or symptoms? Symptoms of mild dehydration may include:   Thirst.  Dry lips.  Slightly dry mouth.  Dry, warm skin.  Dizziness. Symptoms of moderate dehydration may include:   Very dry mouth.  Muscle cramps.  Dark urine. Urine may be the color of tea.  Decreased urine production.  Decreased tear production.  Heartbeat that is irregular or faster than normal (palpitations).  Headache.  Light-headedness, especially when you stand up from a sitting position.  Fainting (syncope). Symptoms of severe dehydration may include:   Changes in skin, such as:  Cold and clammy skin.  Blotchy (mottled) or pale skin.  Skin that does  not quickly return to normal after being lightly pinched and released (poor skin turgor).  Changes in body fluids, such as:  Extreme thirst.  No tear production.  Inability to sweat when body temperature is high, such as in hot weather.  Very little urine production.  Changes in vital signs, such as:  Weak pulse.  Pulse that is more than 100 beats a minute when sitting still.  Rapid breathing.  Low blood pressure.  Other changes, such as:  Sunken eyes.  Cold hands and feet.  Confusion.  Lack of energy (lethargy).  Difficulty waking up from sleep.  Short-term weight loss.  Unconsciousness. How is this diagnosed? This condition is diagnosed based on your symptoms and a physical exam. Blood and urine tests may be done to help confirm the diagnosis. How is this treated? Treatment for this condition depends on the severity. Mild or moderate dehydration can often be treated at home. Treatment should be started right away. Do not wait until dehydration becomes severe. Severe dehydration is an emergency and it needs to be treated in a hospital. Treatment for mild dehydration may include:   Drinking more fluids.  Replacing salts and minerals in your blood (electrolytes) that you may have lost. Treatment for moderate dehydration may include:   Drinking an oral rehydration solution (ORS). This is a drink that helps you replace fluids and electrolytes (rehydrate). It can be found at pharmacies and retail stores. Treatment for severe dehydration may include:   Receiving fluids through an IV tube.  Receiving an electrolyte solution through a feeding tube that is   passed through your nose and into your stomach (nasogastric tube, or NG tube).  Correcting any abnormalities in electrolytes.  Treating the underlying cause of dehydration. Follow these instructions at home:  If directed by your health care provider, drink an ORS:  Make an ORS by following instructions on the  package.  Start by drinking small amounts, about  cup (120 mL) every 5-10 minutes.  Slowly increase how much you drink until you have taken the amount recommended by your health care provider.  Drink enough clear fluid to keep your urine clear or pale yellow. If you were told to drink an ORS, finish the ORS first, then start slowly drinking other clear fluids. Drink fluids such as:  Water. Do not drink only water. Doing that can lead to having too little salt (sodium) in the body (hyponatremia).  Ice chips.  Fruit juice that you have added water to (diluted fruit juice).  Low-calorie sports drinks.  Avoid:  Alcohol.  Drinks that contain a lot of sugar. These include high-calorie sports drinks, fruit juice that is not diluted, and soda.  Caffeine.  Foods that are greasy or contain a lot of fat or sugar.  Take over-the-counter and prescription medicines only as told by your health care provider.  Do not take sodium tablets. This can lead to having too much sodium in the body (hypernatremia).  Eat foods that contain a healthy balance of electrolytes, such as bananas, oranges, potatoes, tomatoes, and spinach.  Keep all follow-up visits as told by your health care provider. This is important. Contact a health care provider if:  You have abdominal pain that:  Gets worse.  Stays in one area (localizes).  You have a rash.  You have a stiff neck.  You are more irritable than usual.  You are sleepier or more difficult to wake up than usual.  You feel weak or dizzy.  You feel very thirsty.  You have urinated only a small amount of very dark urine over 6-8 hours. Get help right away if:  You have symptoms of severe dehydration.  You cannot drink fluids without vomiting.  Your symptoms get worse with treatment.  You have a fever.  You have a severe headache.  You have vomiting or diarrhea that:  Gets worse.  Does not go away.  You have blood or green matter  (bile) in your vomit.  You have blood in your stool. This may cause stool to look black and tarry.  You have not urinated in 6-8 hours.  You faint.  Your heart rate while sitting still is over 100 beats a minute.  You have trouble breathing. This information is not intended to replace advice given to you by your health care provider. Make sure you discuss any questions you have with your health care provider. Document Released: 07/09/2005 Document Revised: 02/03/2016 Document Reviewed: 09/02/2015 Elsevier Interactive Patient Education  2017 Elsevier Inc.  

## 2016-09-11 NOTE — Progress Notes (Signed)
Nutrition Follow-up:  Patient seen in infusion just as getting ready to leave infusion area and go home.  Wife reports patient is using 5 cans of osmolite 1.5 via jejunostomy tube via continuous pump for 16 hours per day.  She reports flushing the tube with 140ml of water before starting feeding and 178ml after feeding has finished.   Wife reports patient is drinking fluids and keeping them down about 3 cups worth per day.  Food intake is very limited with eating just few Tbsp at a time but having nausea and vomiting after trying to eat.    Current tube feeding regimen providing 1777 kcals, 74 g of protein, 1873ml fluid (includes oral fluid)  Medications: benadryl, folic acid, remeron, zofran, compazine, vit b 12  Labs: reviewed  Anthropometrics:   Weight today was 130 lb 14.4 oz decreased from 135 lb on 08/13/16  Estimated Energy Needs  Kcals: 2030-2230 calories/d Protein: 85-100 g  Fluid: 2.2 L/d  NUTRITION DIAGNOSIS: Unintentional weight loss continues   INTERVENTION:   Recommend patient increase Osmolite 1.5 to 6 cans per day via continuous pump to infuse for 19hours at rate of 4ml/hr.   Recommend patient flush with 131ml of water (3 syringe full) before starting pump and 143ml of water after pump infusion is complete.  Patient will need an additional free water flush of 137ml during the day to better meet hydration needs.  Patient will need to continue to drink 661ml or 2.5 cups of water per day to meet hydration needs. Wife confirmed that he is able to do this.  Wife and patient agreeable to increasing osmolite to 6 cans and additional water flush and oral intake.   [facility] will be notified of increase in tube feeding regimen  New tube feeding regimen will provide 2133 calories/d, 89 g of protein, 2255ml fluid/d (includes oral water as well)    MONITORING, EVALUATION, GOAL: Patient will tolerate increased continuous tube feeding regimen to prevent weight  loss.   NEXT VISIT: Feb 27th  Naeem Quillin B. Zenia Resides, [place], [place] (pager)

## 2016-09-11 NOTE — Progress Notes (Signed)
[place] OFFICE PROGRESS NOTE   Diagnosis:  Esophagus cancer, anemia Oncology History   Presented with solid dysphagia at least daily with chest pain. Involuntary weight loss of 10-12 lb over past year  Malignant neoplasm of lower third of esophagus (HCC)   Staging form: Esophagus - Adenocarcinoma, AJCC 7th Edition   - Clinical stage from 12/15/2015: Stage IIIA (T3, N1, M0) - Signed by Truitt Merle, MD on 12/24/2015   - Pathologic stage from 04/26/2016: yT0, N0, cM0, GX - Signed by [name], MD on 04/27/2016      Malignant neoplasm of lower third of esophagus ([place])   11/23/2015 Procedure    EGD per [facility](Dr. Toledo):6cm mass in lower third of esophagus      11/24/2015 Initial Diagnosis    Esophageal cancer ([place])      11/24/2015 Pathology Results    Mucinous adenocarcinoma, moderately differentiated-Her2 analysis pending      12/05/2015 Imaging    PET scan showed hypermetabolic a mass in distal esophagus, hypermetabolic activity in the left anterior prostate gland, metabolic density in left adrenal nodule, CT attenuation suggest a benign adrenal adenoma. Left apical 2 mm lung nodule.      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 Small hypermetabolic left adrenal mass, this has not changed in size compared to 12/01/15 but has a maximum  standard uptake value of 6.2 (previously 4.2). Indeterminate density and MRI characteristics, not specific for adenoma. Early metastatic disease is not readily excluded although the lack of change of size is somewhat reassuring. Surveillance recommended. 2. No other hypermetabolic lesions are identified. 3. Moderate to large left pleural effusion with passive atelectasis. 4. Esophagectomy with gastric pull-through. 5. No hypermetabolic liver lesions are seen. 6. Enlarged prostate gland.     INTERVAL HISTORY:   Mr. Carrozza returns as scheduled. He completed cycle 1 FOLFOX 09/04/2016. He had no increase in baseline nausea following the chemotherapy. He has a single mouth sore that predated chemotherapy. He has occasional loose stools. He did not experience cold sensitivity. Main source of nutrition is the feeding tube. Oral intake is diminished due to periodic vomiting. He denies pain. Over the past few days he has noted increased weakness and shortness of breath. He denies fever. He has an occasional cough. No bleeding.  Last blood transfusion was 09/03/2016.  Objective:  Vital signs in last 24 hours:  Blood pressure (!) 93/57, pulse (!) 103, temperature 98.5 F (36.9 C), temperature source Oral, resp. rate 18, height _0  (1.651 m), weight 130 lb 14.4 oz (59.4 kg), SpO2 100 %.    HEENT: No thrush or ulcers. Resp: Diminished breath sounds left lung field. No respiratory distress. Cardio: Heart is regular with mild tachycardia. GI: Abdomen soft and nontender. No hepatomegaly. Left abdomen feeding tube. Vascular: No leg edema. Neuro: Alert and oriented.  Skin: No rash. Port-A-Cath without erythema.    Lab Results:  Lab Results  Component Value Date   WBC 4.9 09/11/2016   HGB 9.0 (L) 09/11/2016   HCT 26.9 (L) 09/11/2016   MCV 90.3 09/11/2016   PLT 159 09/11/2016   NEUTROABS 2.9 09/11/2016    Imaging:  No results found.  Medications: I have reviewed the patient's  current medications.  Assessment/Plan: 1. Worsening anemia and thrombocytopenia, DIC. Bone marrow biopsy 08/31/2016 confirmed metastatic adenocarcinoma.  2. Distal esophageal adenocarcinoma, uT3N1M0, stage IIIA, ypT0N0M0. Bone marrow biopsy 08/31/2016 confirmed metastatic adenocarcinoma status post cycle 1 FOLFOX 09/04/2016. 3. Recurrent left pleural effusion possibly related to esophagectomy. Status post thoracentesis 3 with cytology negative. 4. Hypertension. 5. Malnutrition, nausea/vomiting and weight loss. He continues tube feedings. 6. Anorexia and depression. On Remeron. 7. History of sinus tachycardia currently on atenolol.   Disposition: Mr. Harkins has completed 1 cycle of FOLFOX. The platelet count has improved considerably. He is requiring less frequent red cell transfusion support.  He has noted increased weakness and shortness of breath over the past few days. We are referring him for a chest x-ray to follow-up the known left pleural effusion. He is hypotensive which is likely contributing as well. He will receive a liter of IV fluids in the office. He is currently on atenolol for rate control. We decreased the atenolol dose to 12.5 mg daily.  He will continue tube feedings, free water. We will asked the [place] dietitian to evaluate for free water needs.  He will return as scheduled on 09/14/2016. He will contact the office in the interim with any problems.  Plan reviewed with Dr. Burr Medico. 25 minutes were spent face-to-face at today's visit with the majority of that time involved in counseling/coordination of care.      Ned Card ANP/GNP-BC   09/11/2016  10:33 AM

## 2016-09-12 ENCOUNTER — Other Ambulatory Visit: Payer: Self-pay | Admitting: Nurse Practitioner

## 2016-09-12 LAB — FIBRINOGEN: FIBRINOGEN: 256 mg/dL (ref 193–507)

## 2016-09-13 ENCOUNTER — Telehealth: Payer: Self-pay

## 2016-09-13 ENCOUNTER — Other Ambulatory Visit: Payer: Self-pay | Admitting: Nurse Practitioner

## 2016-09-13 DIAGNOSIS — C155 Malignant neoplasm of lower third of esophagus: Secondary | ICD-10-CM

## 2016-09-13 DIAGNOSIS — J90 Pleural effusion, not elsewhere classified: Secondary | ICD-10-CM

## 2016-09-13 NOTE — Telephone Encounter (Signed)
-----   Message from Owens Shark, NP sent at 09/12/2016  4:05 PM EST ----- Please contact patient. The chest x-ray from yesterday shows a moderate left pleural effusion. We can refer him for a thoracentesis if he is symptomatic.

## 2016-09-13 NOTE — Progress Notes (Signed)
Fred [name]  [phone] [phone]  Clinic Follow up Note   Patient Care Team: [name], MD as PCP - General (Internal Medicine) Truitt Merle, MD as Consulting Physician (Hematology) Kyung Rudd, MD as Consulting Physician (Radiation Oncology) [name], MD as Consulting Physician (Cardiothoracic Surgery) Karie Mainland, RD as Dietitian (Nutrition) Minus Breeding, MD as Consulting Physician (Cardiology)   CHIEF COMPLAINTS:  Follow up metastatic esophageal cancer    Oncology History   Presented with solid dysphagia at least daily with chest pain. Involuntary weight loss of 10-12 lb over past year  Malignant neoplasm of lower third of esophagus (HCC)   Staging form: Esophagus - Adenocarcinoma, AJCC 7th Edition   - Clinical stage from 12/15/2015: Stage IIIA (T3, N1, M0) - Signed by Truitt Merle, MD on 12/24/2015   - Pathologic stage from 04/26/2016: yT0, N0, cM0, GX - Signed by [name], MD on 04/27/2016      Malignant neoplasm of lower third of esophagus ([place])   11/23/2015 Procedure    EGD per [facility](Dr. Toledo):6cm mass in lower third of esophagus      11/24/2015 Initial Diagnosis    Esophageal cancer ([place])      11/24/2015 Pathology Results    Mucinous adenocarcinoma, moderately differentiated-Her2 analysis pending      12/05/2015 Imaging    PET scan showed hypermetabolic a mass in distal esophagus, hypermetabolic activity in the left anterior prostate gland, metabolic density in left adrenal nodule, CT attenuation suggest a benign adrenal adenoma. Left apical 2 mm lung nodule.      12/15/2015 Procedure    EUS showed a uT3N1 lesion from 27cm to 35cm from the incisors       12/26/2015 - 02/02/2016 Chemotherapy    Weekly carboplatin AUC 2 and Taxol 45 mg/m, with concurrent radiation      12/26/2015 - 02/02/2016 Radiation Therapy    Neoadjuvant chemoradiation to the esophageal cancer      04/23/2016 Surgery     total esophagectomy with cervical esophagogastrostomy, pyloroplasty, feeding jejunostomy       04/23/2016 Pathology Results    Esophagectomy showed no residual tumor, 8 lymph nodes were all negative, incidental leiomyoma 0.2 cm      08/14/2016 PET scan    IMPRESSION: 1. Small hypermetabolic left adrenal mass, this has not changed in size compared to 12/01/15 but has a maximum standard uptake value of 6.2 (previously 4.2). Indeterminate density and MRI characteristics, not specific for adenoma. Early metastatic disease is not readily excluded although the lack of change of size is somewhat reassuring. Surveillance recommended. 2. No other hypermetabolic lesions are identified. 3. Moderate to large left pleural effusion with passive atelectasis. 4. Esophagectomy with gastric pull-through. 5. No hypermetabolic liver lesions are seen. 6. Enlarged prostate gland.      08/16/2016 - 08/23/2016 Hospital Admission    He was diagnosed with DIC, unclear etiology. He was given blood products. He underwent echocardiogram which was negative for endocarditis, CTA chest/abd which was negative for aneurysm or dissection as work up for DIC. He was on prednisone for some time, but stopped as it was not helping much for DIC.       08/28/2016 - 09/01/2016 Hospital Admission    He has had issues with recurrent L sided pleural effusions, DIC and melena. He was last admitted in 1/25, given supportive care with a thoracentesis and blood transfusion. Dr Burr Medico has been following DIC and has requested a direct admit for melena,  anemia and thrombocytopenia. GI called for EGD.        08/31/2016 Pathology Results    Bone Marrow Biopsy, right iliac - METASTATIC ADENOCARCINOMA. - SEE COMMENT. PERIPHERAL BLOOD: - NORMOCYTIC ANEMIA. - THROMBOCYTOPENIA.      08/31/2016 Progression    Bone marrow biopsy showed metastatic adenocarcinoma      09/04/2016 -  Chemotherapy    FOLFOX every 2 weeks        HISTORY OF  PRESENTING ILLNESS (12/14/2015):  Fred [name] 76 y.o. male is here because of His newly diagnosed esophageal cancer. He is accompanied by his wife and daughter to my clinic today.  He has had dysphagia for 2 months, mainly with solid food, no dysphagia with soft food or liquid. He also has mild pain in mid chest when he swallows. No nausea, abdominal pain, or other discomfort. He has lost about 15 lbs in the last year, no other symoptoms. He was seen by his primary care physician at [facility], and referred to gastroenterologist Dr. Alice Reichert. He underwent EGD on 11/23/2015, which showed a 6 cm mass in the lower third of esophagus. Biopsy showed adenocarcinoma, moderately differentiated. He was referred to [place] to consider neoadjuvant therapy. He is scheduled to see a Psychologist, sport and exercise at [facility] later this week.  He has had some urinary frequency, underwent TURP on 11/02/2015.   CURRENT THERAPY: first line chemo FOLFOX every 2 weeks started on 09/04/2016  INTERIM HISTORY: [name] returns for follow up. He presents in a wheelchair. He feels pretty good, but doesn't have much energy to do anything. After taking a shower, he is wiped out. He had some nausea last week, but it is better this week. He has been taking compazine a couple of times a day. He has a lot of mucus production that is clear in color. He has some SOB with exertion. He tube feeds about 5 cans a day. Pt occasionally has a shooting and sharp pain in his L hip. He is unsure if this is related to sitting for too long. Denies any other concerns.    MEDICAL HISTORY:  Past Medical History:  Diagnosis Date  . Esophageal cancer ([place]) 11/23/15   lower 3rd esohagus   . GERD (gastroesophageal reflux disease)   . Hyperlipidemia   . Hypertension     SURGICAL HISTORY: Past Surgical History:  Procedure Laterality Date  . CHEST TUBE INSERTION Left 04/23/2016   Procedure: CHEST TUBE INSERTION;  Surgeon: [name], MD;  Location: [place];   Service: Thoracic;  Laterality: Left;  . COLONOSCOPY N/A 08/30/2016   Procedure: COLONOSCOPY;  Surgeon: Milus Banister, MD;  Location: [facility];  Service: Endoscopy;  Laterality: N/A;  . COMPLETE ESOPHAGECTOMY N/A 04/23/2016   Procedure: TRANSHIATIAL TOTAL ESOPHAGECTOMY COMPLETE; CERVICAL ESOPHAGOGASTROSTOMY AND PYLOROPLASTY;  Surgeon: [name], MD;  Location: [place];  Service: Thoracic;  Laterality: N/A;  . cystoscope  4/12/1   prostate  . ERCP    . ESOPHAGOGASTRODUODENOSCOPY (EGD) WITH PROPOFOL N/A 08/29/2016   Procedure: ESOPHAGOGASTRODUODENOSCOPY (EGD) WITH PROPOFOL;  Surgeon: Milus Banister, MD;  Location: [facility];  Service: Endoscopy;  Laterality: N/A;  . INGUINAL HERNIA REPAIR    . IR GENERIC HISTORICAL  08/31/2016   IR FLUORO GUIDE PORT INSERTION RIGHT 08/31/2016 Corrie Mckusick, DO [facility]  . IR GENERIC HISTORICAL  08/31/2016   IR US GUIDE VASC ACCESS RIGHT 08/31/2016 Corrie Mckusick, DO [facility]  . JEJUNOSTOMY N/A 04/23/2016   Procedure: FEEDING JEJUNOSTOMY;  Surgeon: [name], MD;  Location: [place];  Service: Thoracic;  Laterality: N/A;  . LEG SURGERY Left    hole  . PROSTATE BIOPSY  10/05/15  . right shoulder surgery    . VIDEO BRONCHOSCOPY N/A 04/23/2016   Procedure: VIDEO BRONCHOSCOPY;  Surgeon: [name], MD;  Location: [facility];  Service: Thoracic;  Laterality: N/A;    SOCIAL HISTORY: Social History   Social History  . Marital status: Married    Spouse name: N/A  . Number of children: 1  . Years of education: N/A   Occupational History  . Truck Geophysicist/field seismologist    Social History Main Topics  . Smoking status: Former Smoker    Packs/day: 1.00    Years: 10.00    Quit date: 07/24/1967  . Smokeless tobacco: Never Used  . Alcohol use No  . Drug use: No  . Sexual activity: Not Currently   Other Topics Concern  . Not on file   Social History Narrative  . No narrative on file    FAMILY HISTORY: Family History  Problem Relation Age of Onset  .  Cancer Maternal Grandfather 21    gastric cancer   . Alzheimer's disease Mother   . Diabetes Mother   . Stroke Father 61  . Multiple sclerosis Sister     ALLERGIES:  is allergic to no known allergies.  MEDICATIONS:  Current Outpatient Prescriptions  Medication Sig Dispense Refill  . acetaminophen (TYLENOL) 325 MG tablet Take 650 mg by mouth every 6 (six) hours as needed.    Marland Kitchen atenolol (TENORMIN) 25 MG tablet Take 1 tab in the morning and half tab in the evening (Patient taking differently: Take 12.5-25 mg by mouth 2 (two) times daily. Take 37m in the morning and 12.525min the evening) 60 tablet 1  . diphenhydrAMINE (BENADRYL) 25 MG tablet Take 25 mg by mouth every 6 (six) hours as needed.    . folic acid (FOLVITE) 1 MG tablet Take 1 tablet (1 mg total) by mouth daily. 30 tablet 0  . lidocaine-prilocaine (EMLA) cream Apply 1 application topically as needed. Apply 1-2 tsp over port site 1 hour prior to chemotherapy 30 g 1  . mirtazapine (REMERON) 15 MG tablet Take 1 tablet (15 mg total) by mouth at bedtime. 30 tablet 2  . Nutritional Supplements (FEEDING SUPPLEMENT, OSMOLITE 1.5 CAL,) LIQD Increase osmolite 1.5 to 6 cans to be infused via continuous pump for 19 hours per day. Flush with 18053mf water before and after continuous feeding.  Give an additional 180m82m water via tube.  Drink an additional 600ml46mwater during the day.  Send formula and bags. 1422 mL 0  . oxycodone (OXY-IR) 5 MG capsule Take 5 mg by mouth every 6 (six) hours as needed for pain.     . proMarland Kitchenhlorperazine (COMPAZINE) 10 MG tablet Take 1 tablet (10 mg total) by mouth every 6 (six) hours as needed for nausea or vomiting. 30 tablet 0  . vitamin B-12 1000 MCG tablet Take 1 tablet (1,000 mcg total) by mouth daily. 30 tablet 0  . Water For Irrigation, Sterile (FREE WATER) SOLN Place 220 mLs into feeding tube 4 (four) times daily.    . ondansetron (ZOFRAN ODT) 8 MG disintegrating tablet Take 1 tablet (8 mg total) by mouth  every 8 (eight) hours as needed for nausea or vomiting. (Patient not taking: Reported on 09/14/2016) 30 tablet 2   No current facility-administered medications for this visit.  REVIEW OF SYSTEMS:   Constitutional: Denies fevers, chills or abnormal night sweats, (+) very fatigued Eyes: Denies blurriness of vision, double vision or watery eyes Ears, nose, mouth, throat, and face: Denies mucositis or sore throat (+) sputum production, running nose Respiratory: Denies cough, dyspnea or wheezes (+) SOB Cardiovascular: Denies palpitation, chest discomfort or lower extremity swelling Gastrointestinal:  Denies heartburn or change in bowel habits (+) nausea Skin: Denies abnormal skin rashes Lymphatics: Denies new lymphadenopathy or easy bruising Neurological:Denies numbness, tingling or new weaknesses Behavioral/Psych: Mood is stable, no new changes  Musculoskeletal: (+) L hip pain All other systems were reviewed with the patient and are negative.  PHYSICAL EXAMINATION: ECOG PERFORMANCE STATUS: 3  BP 97/60 (BP Location: Left Arm, Patient Position: Sitting)   Pulse 88   Temp 97.9 F (36.6 C) (Oral)   Resp 17   Ht _0  (1.651 m)   Wt 132 lb 8 oz (60.1 kg)   SpO2 100%   BMI 22.05 kg/m    GENERAL:alert, no distress and comfortable, in wheelchair SKIN: Appears to be pale, skin texture, turgor are normal, no rashes or significant lesions EYES: normal, conjunctiva are pink and non-injected, sclera clear OROPHARYNX:no exudate, no erythema and lips, buccal mucosa, and tongue normal  NECK: supple, thyroid normal size, non-tender, without nodularity LYMPH:  no palpable lymphadenopathy in the cervical, axillary or inguinal LUNGS: clear to auscultation and percussion with normal breathing effort except decreased breath sound at the left lung base HEART: regular rate & rhythm and no murmurs and no lower extremity edema ABDOMEN:abdomen soft, non-tender and normal bowel sounds, J tube in place.  (+) Hepatomegaly, liver is palpable 2-3 cm below the rib cage, nontender. Musculoskeletal:no cyanosis of digits and no clubbing  PSYCH: alert & oriented x 3 with fluent speech NEURO: no focal motor/sensory deficits  LABORATORY DATA:  I have reviewed the data as listed CBC Latest Ref Rng & Units 09/14/2016 09/11/2016 09/07/2016  WBC 4.0 - 10.3 10e3/uL 6.1 4.9 3.9(L)  Hemoglobin 13.0 - 17.1 g/dL 9.6(L) 9.0(L) 9.9(L)  Hematocrit 38.4 - 49.9 % 29.2(L) 26.9(L) 29.1(L)  Platelets 140 - 400 10e3/uL 211 159 95(L)   CMP Latest Ref Rng & Units 09/04/2016 08/31/2016 08/31/2016  Glucose 70 - 140 mg/dl 115 106(H) 109(H)  BUN 7.0 - 26.0 mg/dL 19.0 14 13  Creatinine 0.7 - 1.3 mg/dL 0.6(L) 0.58(L) 0.48(L)  Sodium 136 - 145 mEq/L 136 138 137  Potassium 3.5 - 5.1 mEq/L 4.8 4.5 4.0  Chloride 101 - 111 mmol/L - 108 105  CO2 22 - 29 mEq/L _1 Calcium 8.4 - 10.4 mg/dL 9.0 8.1(L) 8.3(L)  Total Protein 6.4 - 8.3 g/dL 6.2(L) 5.4(L) -  Total Bilirubin 0.20 - 1.20 mg/dL 1.79(H) 1.5(H) -  Alkaline Phos 40 - 150 U/L 452(H) 328(H) -  AST 5 - 34 U/L 60(H) 44(H) -  ALT 0 - 55 U/L 21 27 -   Results for CHRISTIAAN, STREBECK (MRN [id number]) as of 09/13/2016 09:50  Ref. Range 12/14/2015 14:55 12/14/2015 16:52 01/23/2016 08:51 08/09/2016 13:04 09/07/2016 10:56  CEA Latest Ref Range: 0.0 - 4.7 ng/mL 28.5 (H)   2,382.0 (H) 4,056.0 (H)  [facility] ([facility]) Latest Ref Range: 0.00 - 5.00 ng/mL    1,731.10 x 2 dilu... (H) 2,612.34 Result C... (H)    Pathology report  Diagnosis 08/31/2016 Bone Marrow Biopsy, right iliac - METASTATIC ADENOCARCINOMA. - SEE COMMENT. PERIPHERAL BLOOD: - NORMOCYTIC ANEMIA. - THROMBOCYTOPENIA.  Diagnosis 08/02/16 PLEURAL FLUID, LEFT (SPECIMEN 1  OF 1 COLLECTED 08/02/16): REACTIVE MESOTHELIAL CELLS PRESENT. LYMPHOCYTES PRESENT.  Diagnosis 11/23/2015 Mass, lower third of the esophagus, mucosal biopsy Mucinous adenocarcinoma, moderately differentiated.  HER 2 (-)   Diagnosis 04/23/2016 1. Lymph  node, biopsy, Cervical - ONE OF ONE LYMPH NODES NEGATIVE FOR CARCINOMA (0/1). 2. Lymph node, biopsy, Periesophageal - BLOOD WITH SCANT BENIGN SOFT TISSUE AND SKELETAL MUSCLE. - NO MALIGNANCY IDENTIFIED. 3. Esophagogastrectomy - ULCERATION WITH INFLAMMATION AND REACTIVE CHANGES. - FOCAL INTESTINAL METAPLASIA. - CHANGES CONSISTENT WITH TREATMENT EFFECT. - SEVEN OF SEVEN LYMPH NODES NEGATIVE FOR CARCINOMA (0/7). - INCIDENTAL LEIOMYOMA, 0.2 CM. - MARGINS ARE NEGATIVE FOR DYSPLASIA OR MALIGNANCY. - SEE ONCOLOGY TABLE. Microscopic Comment 3. ESOPHAGUS: Specimen: Esophagus and proximal stomach, cervical lymph node. Procedure: Esophagogastrectomy and cervical lymph node biopsy. Tumor Site: Presumed GE junction, see comment. Relationship of Tumor to esophagogastric junction: Can not be assessed. Distance of tumor center from esophagogastric junction: Can not be assessed. Tumor Size Greatest dimension: No residual tumor present. Histologic Type: Per medical record adenocarcinoma (no biopsy for review). Histologic Grade: N/A. Microscopic Tumor Extension: N/A. Margins: Negative. Treatment Effect: Present (TRS 0). Lymph-Vascular Invasion: Not identified. Perineural Invasion: Not identified. Lymph nodes: number examined 8; number positive: 0 TNM: ypT0, ypN0 see comment. 1 of 3 FINAL for MELVEN, STOCKARD ([id number]) Microscopic Comment(continued) Ancillary studies: None. Comments: The entire GE junction is submitted for evaluation. There is ulceration with associated inflammation. There is acellular mucin/myxoid changes which extend through the muscularis propria, but no viable/residual tumor is identified and thus the stage is ypT0. There is focal background intestinal metaplasia, which may have been pre-existing (Barrett's esophagus) or related to therapy.   RADIOGRAPHIC STUDIES: I have personally reviewed the radiological images as listed and agreed with the findings in the report. Dg  Chest 1 View  Result Date: 08/22/2016 CLINICAL DATA:  Post left thoracentesis EXAM: CHEST 1 VIEW COMPARISON:  08/16/2016 FINDINGS: Decreasing left effusion following thoracentesis. Small left effusion remains. No pneumothorax. Left base atelectasis. Right lung is clear. Heart is normal size. No acute bony abnormality. IMPRESSION: Small left pleural effusion, decreased following thoracentesis. No pneumothorax. Left base atelectasis. Electronically Signed   By: Rolm Baptise M.D.   On: 08/22/2016 11:52   Dg Chest 2 View  Result Date: 09/11/2016 CLINICAL DATA:  Malignant neoplasm of lower third of esophagus. F/u left pleural effusion. Ex-smoker. Sob for 2 days. Hx htn. EXAM: CHEST  2 VIEW COMPARISON:  08/28/2016 FINDINGS: Moderate left pleural effusion obscures the left hemidiaphragm and portions of the left heart border, without significant change from the recent prior study. No right pleural effusion. There is some dependent opacity at the left lung base adjacent to the pleural effusion consistent with atelectasis. Lungs are otherwise clear. No pneumothorax. Cardiac silhouette is normal in size. No mediastinal or hilar masses. No convincing adenopathy. There is a new right anterior chest wall Port-A-Cath extending through the right internal jugular vein to have its tip in the lower superior vena cava. Mild to moderate compression deformity noted of what appears to be L1. This is stable from a lateral chest radiograph dated 07/26/2016. IMPRESSION: 1. Persistent moderate left pleural effusion with associated dependent compressive atelectasis. 2. No evidence of pneumonia. No pulmonary edema. Lungs are otherwise clear. 3. Right anterior chest wall Port-A-Cath is well positioned. No other change from most recent prior study. Electronically Signed   By: Lajean Manes M.D.   On: 09/11/2016 14:29   US Abdomen Complete  Result Date: 08/17/2016 CLINICAL DATA:  Abnormal liver function. EXAM: ABDOMEN ULTRASOUND COMPLETE  COMPARISON:  CT 08/14/2016 FINDINGS: Gallbladder: No gallstones or wall thickening visualized. No sonographic Murphy sign noted by sonographer. Common bile duct: Diameter: Normal caliber, 5 mm Liver: No focal lesion identified. Within normal limits in parenchymal echogenicity. IVC: No abnormality visualized. Pancreas: Visualized portion unremarkable. Spleen: Size and appearance within normal limits. Right Kidney: Length: 11.6 cm. Multiple cysts, the largest 6.1 cm. No hydronephrosis. Normal echotexture. Left Kidney: Length: 10.6 cm. Multiple cysts, the largest 4.7 cm. No hydronephrosis. Normal echotexture. Abdominal aorta: No aneurysm visualized. Other findings: None. IMPRESSION: No acute findings. Bilateral renal cysts. Electronically Signed   By: Rolm Baptise M.D.   On: 08/17/2016 08:22   Dg Hand 2 View Right  Result Date: 08/31/2016 CLINICAL DATA:  Third MCP pain. EXAM: RIGHT HAND - 2 VIEW COMPARISON:  None. FINDINGS: There is no evidence of fracture or dislocation. There is no evidence of arthropathy or other focal bone abnormality. Soft tissues are unremarkable. IMPRESSION: No cause for the patient's third MCP pain identified. Scattered degenerative changes. Electronically Signed   By: Dorise Bullion III M.D   On: 08/31/2016 18:22   Ir US Guide Vasc Access Right  Result Date: 08/31/2016 INDICATION: 76 year old male with anemia. He has been referred for port catheter placement. EXAM: IMPLANTED PORT A CATH PLACEMENT WITH ULTRASOUND AND FLUOROSCOPIC GUIDANCE MEDICATIONS: 2 g Ancef; The antibiotic was administered within an appropriate time interval prior to skin puncture. ANESTHESIA/SEDATION: Moderate (conscious) sedation was employed during this procedure. A total of Versed 2.0 mg and Fentanyl 100 mcg was administered intravenously. Moderate Sedation Time: 25 minutes. The patient's level of consciousness and vital signs were monitored continuously by radiology nursing throughout the procedure under my  direct supervision. FLUOROSCOPY TIME:  Zero minutes, 6 seconds COMPLICATIONS: None PROCEDURE: The procedure, risks, benefits, and alternatives were explained to the patient. Questions regarding the procedure were encouraged and answered. The patient understands and consents to the procedure. Ultrasound survey was performed with images stored and sent to PACs. The right neck and chest was prepped with chlorhexidine, and draped in the usual sterile fashion using maximum barrier technique (cap and mask, sterile gown, sterile gloves, large sterile sheet, hand hygiene and cutaneous antiseptic). Antibiotic prophylaxis was provided with 2.0g Ancef administered IV one hour prior to skin incision. Local anesthesia was attained by infiltration with 1% lidocaine without epinephrine. Ultrasound demonstrated patency of the right internal jugular vein, and this was documented with an image. Under real-time ultrasound guidance, this vein was accessed with a 21 gauge micropuncture needle and image documentation was performed. A small dermatotomy was made at the access site with an 11 scalpel. A 0.018" wire was advanced into the SVC and used to estimate the length of the internal catheter. The access needle exchanged for a 36F micropuncture vascular sheath. The 0.018" wire was then removed and a 0.035" wire advanced into the IVC. An appropriate location for the subcutaneous reservoir was selected below the clavicle and an incision was made through the skin and underlying soft tissues. The subcutaneous tissues were then dissected using a combination of blunt and sharp surgical technique and a pocket was formed. Superficial artery was encountered, requiring suture ligation. A single lumen power injectable portacatheter was then tunneled through the subcutaneous tissues from the pocket to the dermatotomy and the port reservoir placed within the subcutaneous pocket. The venous access site was then serially dilated and a peel away  vascular sheath placed over the wire. The wire was  removed and the port catheter advanced into position under fluoroscopic guidance. The catheter tip is positioned in the cavoatrial junction. This was documented with a spot image. The portacatheter was then tested and found to flush and aspirate well. The port was flushed with saline followed by 100 units/mL heparinized saline. The pocket was then closed in two layers using first subdermal inverted interrupted absorbable sutures followed by a running subcuticular suture. The epidermis was then sealed with Dermabond. The dermatotomy at the venous access site was also seal with Dermabond. Patient tolerated the procedure well and remained hemodynamically stable throughout. No complications encountered and no significant blood loss encountered IMPRESSION: Status post right IJ port catheter placement. Catheter ready for use. Signed, Dulcy Fanny. Earleen Newport, DO [facility] Electronically Signed   By: Corrie Mckusick D.O.   On: 08/31/2016 17:10   Ct Biopsy  Result Date: 08/31/2016 INDICATION: History of esophageal cancer and possible DIC. Concern for metastatic disease to the bone marrow versus primary bone marrow dysfunction. EXAM: CT GUIDED BONE MARROW ASPIRATION AND CORE BIOPSY Interventional Radiologist:  Criselda Peaches, MD MEDICATIONS: None. ANESTHESIA/SEDATION: Moderate (conscious) sedation was employed during this procedure. A total of 2 milligrams versed and 50 micrograms fentanyl were administered intravenously. The patient's level of consciousness and vital signs were monitored continuously by radiology nursing throughout the procedure under my direct supervision. Total monitored sedation time: 10 minutes FLUOROSCOPY TIME:  Fluoroscopy Time: 0 minutes 0 seconds (0 mGy). COMPLICATIONS: None immediate. Estimated blood loss: <25 mL PROCEDURE: Informed written consent was obtained from the patient after a  thorough discussion of the procedural risks, benefits and alternatives. All questions were addressed. Maximal Sterile Barrier Technique was utilized including caps, mask, sterile gowns, sterile gloves, sterile drape, hand hygiene and skin antiseptic. A timeout was performed prior to the initiation of the procedure. The patient was positioned prone and non-contrast localization CT was performed of the pelvis to demonstrate the iliac marrow spaces. Maximal barrier sterile technique utilized including caps, mask, sterile gowns, sterile gloves, large sterile drape, hand hygiene, and betadine prep. Under sterile conditions and local anesthesia, an 11 gauge coaxial bone biopsy needle was advanced into the right iliac marrow space. Needle position was confirmed with CT imaging. Initially, bone marrow aspiration was performed. Next, the 11 gauge outer cannula was utilized to obtain a right iliac bone marrow core biopsy. Needle was removed. Hemostasis was obtained with compression. The patient tolerated the procedure well. Samples were prepared with the cytotechnologist. IMPRESSION: Technically successful right-sided bone marrow biopsy. Electronically Signed   By: Jacqulynn Cadet M.D.   On: 08/31/2016 17:43   Dg Chest Port 1 View  Result Date: 08/28/2016 CLINICAL DATA:  Pleural effusions. Previous smoker. History of esophageal cancer and hypertension. EXAM: PORTABLE CHEST 1 VIEW COMPARISON:  08/22/2016 FINDINGS: Small left pleural effusion is increasing in size since previous study. Atelectasis in the left lung base. Right lung is clear. Emphysematous changes bilaterally. Heart size and pulmonary vascularity are normal. Degenerative changes in the spine and shoulders. IMPRESSION: Small but increasing left pleural effusion and basilar atelectasis. Emphysematous changes in the lungs. Electronically Signed   By: Lucienne Capers M.D.   On: 08/28/2016 22:50   Dg Chest Port 1 View  Result Date: 08/16/2016 CLINICAL DATA:   76 y/o  M; fever and history of esophageal cancer. EXAM: PORTABLE CHEST 1 VIEW COMPARISON:  08/02/2016 chest radiograph and 08/14/2016 PET-CT. FINDINGS: The heart size and mediastinal contours are within normal limits  and stable. Moderate left pleural effusion is increased from prior radiograph with probably stable from prior PET-CT with redistribution to the lung base. Associated left basilar opacity. Otherwise no focal consolidation identified. The visualized skeletal structures are unremarkable. IMPRESSION: Moderate left pleural effusion increased from prior chest radiograph, probably stable from prior chest PET-CT. She fusion of the lung base. Associated left basilar opacity probably represents atelectasis, underline pneumonia is not excluded. Electronically Signed   By: Kristine Garbe M.D.   On: 08/16/2016 19:14   Ct Bone Marrow Biopsy & Aspiration  Result Date: 09/07/2016 INDICATION: History of esophageal cancer and possible DIC. Concern for metastatic disease to the bone marrow versus primary bone marrow dysfunction. EXAM: CT GUIDED BONE MARROW ASPIRATION AND CORE BIOPSY Interventional Radiologist:  Criselda Peaches, MD MEDICATIONS: None. ANESTHESIA/SEDATION: Moderate (conscious) sedation was employed during this procedure. A total of 2 milligrams versed and 50 micrograms fentanyl were administered intravenously. The patient's level of consciousness and vital signs were monitored continuously by radiology nursing throughout the procedure under my direct supervision. Total monitored sedation time: 10 minutes FLUOROSCOPY TIME:  Fluoroscopy Time: 0 minutes 0 seconds (0 mGy). COMPLICATIONS: None immediate. Estimated blood loss: <25 mL PROCEDURE: Informed written consent was obtained from the patient after a thorough discussion of the procedural risks, benefits and alternatives. All questions were addressed. Maximal Sterile Barrier Technique was utilized including caps, mask, sterile gowns,  sterile gloves, sterile drape, hand hygiene and skin antiseptic. A timeout was performed prior to the initiation of the procedure. The patient was positioned prone and non-contrast localization CT was performed of the pelvis to demonstrate the iliac marrow spaces. Maximal barrier sterile technique utilized including caps, mask, sterile gowns, sterile gloves, large sterile drape, hand hygiene, and betadine prep. Under sterile conditions and local anesthesia, an 11 gauge coaxial bone biopsy needle was advanced into the right iliac marrow space. Needle position was confirmed with CT imaging. Initially, bone marrow aspiration was performed. Next, the 11 gauge outer cannula was utilized to obtain a right iliac bone marrow core biopsy. Needle was removed. Hemostasis was obtained with compression. The patient tolerated the procedure well. Samples were prepared with the cytotechnologist. IMPRESSION: Technically successful right-sided bone marrow biopsy. Electronically Signed   By: Jacqulynn Cadet M.D.   On: 08/31/2016 17:43   Ct Angio Chest/abd/pel For Dissection W And/or W/wo  Result Date: 08/22/2016 CLINICAL DATA:  History of esophageal cancer. Pleural effusion. Rule out aneurysm or dissection. EXAM: CT ANGIOGRAPHY CHEST, ABDOMEN AND PELVIS TECHNIQUE: Multidetector CT imaging through the chest, abdomen and pelvis was performed using the standard protocol during bolus administration of intravenous contrast. Multiplanar reconstructed images and MIPs were obtained and reviewed to evaluate the vascular anatomy. CONTRAST:  100 cc Isovue 370 IV COMPARISON:  PET CT 08/14/2016 FINDINGS: CTA CHEST FINDINGS Cardiovascular: Aorta is normal caliber. No dissection. Heart is borderline in size. No filling defects in the pulmonary arteries to suggest pulmonary emboli. Mediastinum/Nodes: Changes of prior esophagectomy with gastric pull-through, stable appearance. No mediastinal, hilar, or axillary adenopathy. Lungs/Pleura: Large  left pleural effusion with compressive atelectasis in the left lower lobe, unchanged. Right lung is clear. Trace right pleural effusion. Musculoskeletal: No acute bony abnormality. Review of the MIP images confirms the above findings. CTA ABDOMEN AND PELVIS FINDINGS VASCULAR Aorta: Normal caliber.  No dissection. Celiac: Widely patent SMA: Widely patent Renals: Single bilaterally, widely patent IMA: Widely patent Inflow: Widely patent Veins: Grossly unremarkable. Review of the MIP images confirms the above findings. NON-VASCULAR Hepatobiliary: No  focal hepatic abnormality. Gallbladder unremarkable. Pancreas: Mildly atrophic pancreas. No focal abnormality or ductal dilatation. Spleen: No focal abnormality.  Normal size. Adrenals/Urinary Tract: Small nodule again noted in the left adrenal gland, shown to be hypermetabolic on prior PET CT. This measures approximately 17 mm, unchanged. Right adrenal gland is unremarkable. Bilateral renal cysts. No hydronephrosis. Urinary bladder grossly unremarkable. Stomach/Bowel: Scattered sigmoid diverticulosis. No active diverticulitis. Stomach and small bowel decompressed. Jejunostomy feeding tube in place within the left small bowel loops. Lymphatic: No adenopathy. Reproductive: Prostate enlargement with central calcifications. Other: No free fluid or free air. Musculoskeletal: No acute bony abnormality. Review of the MIP images confirms the above findings. IMPRESSION: No evidence of aortic aneurysm or dissection. No evidence of pulmonary embolus. Heart is borderline in size. Stable small left adrenal nodule which was hypermetabolic on prior PET CT. Stable large left pleural effusion with compressive atelectasis in the left lower lobe. Stable appearance of the gastric pull-through post esophagectomy. Electronically Signed   By: Rolm Baptise M.D.   On: 08/22/2016 12:30   Ir Fluoro Guide Port Insertion Right  Result Date: 08/31/2016 INDICATION: 76 year old male with anemia. He  has been referred for port catheter placement. EXAM: IMPLANTED PORT A CATH PLACEMENT WITH ULTRASOUND AND FLUOROSCOPIC GUIDANCE MEDICATIONS: 2 g Ancef; The antibiotic was administered within an appropriate time interval prior to skin puncture. ANESTHESIA/SEDATION: Moderate (conscious) sedation was employed during this procedure. A total of Versed 2.0 mg and Fentanyl 100 mcg was administered intravenously. Moderate Sedation Time: 25 minutes. The patient's level of consciousness and vital signs were monitored continuously by radiology nursing throughout the procedure under my direct supervision. FLUOROSCOPY TIME:  Zero minutes, 6 seconds COMPLICATIONS: None PROCEDURE: The procedure, risks, benefits, and alternatives were explained to the patient. Questions regarding the procedure were encouraged and answered. The patient understands and consents to the procedure. Ultrasound survey was performed with images stored and sent to PACs. The right neck and chest was prepped with chlorhexidine, and draped in the usual sterile fashion using maximum barrier technique (cap and mask, sterile gown, sterile gloves, large sterile sheet, hand hygiene and cutaneous antiseptic). Antibiotic prophylaxis was provided with 2.0g Ancef administered IV one hour prior to skin incision. Local anesthesia was attained by infiltration with 1% lidocaine without epinephrine. Ultrasound demonstrated patency of the right internal jugular vein, and this was documented with an image. Under real-time ultrasound guidance, this vein was accessed with a 21 gauge micropuncture needle and image documentation was performed. A small dermatotomy was made at the access site with an 11 scalpel. A 0.018" wire was advanced into the SVC and used to estimate the length of the internal catheter. The access needle exchanged for a 22F micropuncture vascular sheath. The 0.018" wire was then removed and a 0.035" wire advanced into the IVC. An appropriate location for the  subcutaneous reservoir was selected below the clavicle and an incision was made through the skin and underlying soft tissues. The subcutaneous tissues were then dissected using a combination of blunt and sharp surgical technique and a pocket was formed. Superficial artery was encountered, requiring suture ligation. A single lumen power injectable portacatheter was then tunneled through the subcutaneous tissues from the pocket to the dermatotomy and the port reservoir placed within the subcutaneous pocket. The venous access site was then serially dilated and a peel away vascular sheath placed over the wire. The wire was removed and the port catheter advanced into position under fluoroscopic guidance. The catheter tip is positioned in the cavoatrial  junction. This was documented with a spot image. The portacatheter was then tested and found to flush and aspirate well. The port was flushed with saline followed by 100 units/mL heparinized saline. The pocket was then closed in two layers using first subdermal inverted interrupted absorbable sutures followed by a running subcuticular suture. The epidermis was then sealed with Dermabond. The dermatotomy at the venous access site was also seal with Dermabond. Patient tolerated the procedure well and remained hemodynamically stable throughout. No complications encountered and no significant blood loss encountered IMPRESSION: Status post right IJ port catheter placement. Catheter ready for use. Signed, Dulcy Fanny. Earleen Newport, DO [facility] Radiology Electronically Signed   By: Corrie Mckusick D.O.   On: 08/31/2016 17:10   US Abdomen Limited Ruq  Result Date: 08/30/2016 CLINICAL DATA:  Elevated liver function studies. History of esophageal malignancy, hyperlipidemia, former smoker. EXAM: US ABDOMEN LIMITED - RIGHT UPPER QUADRANT COMPARISON:  Ultrasound of August 16, 2016 FINDINGS: Gallbladder: No gallstones or wall thickening  visualized. No sonographic Murphy sign noted by sonographer. Common bile duct: Diameter: 5.4 mm Liver: The hepatic echotexture is normal. In the right hepatic lobe posteriorly there is a hyperechoic focus measuring 1.4 x 1.0 x 1.0 cm most compatible with a hemangioma. This is similar in appearance to a hypoechoic liver focus on the March 20, 2016 abdominal CT scan. 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 No acute abnormality within the liver is observed. Normal appearance of the liver and common bile duct. Electronically Signed   By: David  Martinique M.D.   On: 08/30/2016 08:33   US Thoracentesis Asp Pleural Space W/img Guide  Result Date: 08/22/2016 INDICATION: Esophageal cancer, recurrent left pleural effusion. Request made for diagnostic and therapeutic left thoracentesis. EXAM: ULTRASOUND GUIDED DIAGNOSTIC AND THERAPEUTIC LEFT THORACENTESIS MEDICATIONS: None. COMPLICATIONS: None immediate. PROCEDURE: An ultrasound guided thoracentesis was thoroughly discussed with the patient and questions answered. The benefits, risks, alternatives and complications were also discussed. The patient understands and wishes to proceed with the procedure. Written consent was obtained. Ultrasound was performed to localize and mark an adequate pocket of fluid in the left chest. The area was then prepped and draped in the normal sterile fashion. 1% Lidocaine was used for local anesthesia. Under ultrasound guidance a Safe-T-Centesis catheter was introduced. Thoracentesis was performed. The catheter was removed and a dressing applied. FINDINGS: A total of approximately 1 liter of yellow fluid was removed. Samples were sent to the laboratory as requested by the clinical team. IMPRESSION: Successful ultrasound guided diagnostic and therapeutic left thoracentesis yielding 1 liter of pleural fluid. Read by: Rowe Robert, PA-C Electronically Signed   By: Sandi Mariscal M.D.   On: 08/22/2016 11:38   PET  08/14/2016 IMPRESSION: 1. Small hypermetabolic left adrenal mass, this has not changed in size compared to 12/01/15 but has a maximum standard uptake value of 6.2 (previously 4.2). Indeterminate density and MRI characteristics, not specific for adenoma. Early metastatic disease is not readily excluded although the lack of change of size is somewhat reassuring. Surveillance recommended. 2. No other hypermetabolic lesions are identified. 3. Moderate to large left pleural effusion with passive atelectasis. 4. Esophagectomy with gastric pull-through. 5. No hypermetabolic liver lesions are seen. 6. Enlarged prostate gland.  CT angio chest/abdomen/pelvis for dissection w/wo contrast 08/22/2016 IMPRESSION: No evidence of aortic aneurysm or dissection. No evidence of pulmonary embolus. Heart is borderline in size. Stable small left adrenal nodule which was hypermetabolic on prior PET CT. Stable large left pleural  effusion with compressive atelectasis in the left lower lobe. Stable appearance of the gastric pull-through post esophagectomy.  ASSESSMENT & PLAN:  76 y.o. male presented with dysphagia with solid food  1. Distal esophageal adenocarcinoma, uT3N1M0, stage IIIA, ypT0N0M0, diffuse bone metastasis 08/2016 -I previously reviewed his CT scan, PET scan, EGD, and the biopsy findings with patient and his family member in details. -The initial PET scan showed no definitive distant metastasis. 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 This will be followed in the future scan. -By EUS, he had T3 N1 stage III disease. -He completed neoadjuvant radiation with weekly Carbo and Taxol. -I previously reviewed his surgical pathology findings, he has had complete pathologic response to neoadjuvant chemotherapy and radiation, which predicts good prognosis -Unfortunately he developed diffuse bone metastasis in January 2018, confirmed by bone  marrow biopsy -He has started first-line chemotherapy FOLFOX, tolerating well, and his anemia and some cytopenia has improved since chemotherapy -The goal of therapy is palliative -I have requested HER2 test and Foundation one on his initial esophageal mass biopsy from outside lab to see if he would benefit from Herceptin and immunotherapy -He is clinically doing better, still quite fatigued, will continue chemotherapy every 2 weeks -We discussed his clinical response May be difficult to evaluate since his PET scan was negative when he had a bone metastasis, we'll based on the blood counts, and CEA level, also repeat PET scan every 3 months to evaluate his response.   2. 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 Some cytopenia has resolved, he has not required blood transfusions since last week. -Continue monitoring CBC  3. 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  -He has had 3 repeated thoracentesis, all cytology were negative -Continue monitoring  4. HTN -Continue medication and follow-up of his primary care physician  5. Malnutrition, nausea/vomiting, and weight loss -Doing better, he is tolerating J-tube feeding very well, he eats normally  -5 cans a day -Follow up with dietitian  6. Anorexia and depression  -continue mirtazapine.  -He hasn't talked to the nutritionist in a while, he will follow up   7. Fatigue -He would like something to help his energy. -We discussed steroids to help for this, but I told them that this is not a long term solution -He would not like steroids at this time.   8. 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 -I recommend  DNR/DNI, he will think about it   Plan -I have requested a HER-2, PD L1 test and foundation One if we have enough tissue sample. His biopsy sample is at [facility]. I will contact them.  -Continue FOLFOX every 2 weeks, we'll increase his dose next week since he cytopenia has much improved. -I will see him again on March 13 before cycle 3 chemo     All questions were answered. The patient knows to call the clinic with any problems, questions or concerns.  I spent 25 minutes counseling the patient face to face. The total time spent in the appointment was 30 minutes and more than 50% was on counseling.  This document serves as a record of services personally performed by Truitt Merle,  MD. It was created on her behalf by Martinique Casey, a trained medical scribe. The creation of this record is based on the scribe's personal observations and the provider's statements to them. This document has been checked and approved by the attending provider.  I have reviewed the above documentation for accuracy and completeness and I agree with the above.   Truitt Merle, MD 09/14/2016

## 2016-09-13 NOTE — Telephone Encounter (Signed)
Called and informed pt of x-ray results. Pt verbalized understanding and states he would like thoracentesis done tomorrow since he will be at [facility]. Informed pt this nurse would call him back with an appt confirmation. Pt verbalized understanding.

## 2016-09-14 ENCOUNTER — Telehealth: Payer: Self-pay | Admitting: Hematology

## 2016-09-14 ENCOUNTER — Ambulatory Visit (HOSPITAL_BASED_OUTPATIENT_CLINIC_OR_DEPARTMENT_OTHER): Payer: Non-veteran care | Admitting: Hematology

## 2016-09-14 ENCOUNTER — Encounter: Payer: Self-pay | Admitting: Hematology

## 2016-09-14 ENCOUNTER — Other Ambulatory Visit (HOSPITAL_BASED_OUTPATIENT_CLINIC_OR_DEPARTMENT_OTHER): Payer: Non-veteran care

## 2016-09-14 VITALS — BP 97/60 | HR 88 | Temp 97.9°F | Resp 17 | Ht 65.0 in | Wt 132.5 lb

## 2016-09-14 DIAGNOSIS — R5383 Other fatigue: Secondary | ICD-10-CM | POA: Diagnosis not present

## 2016-09-14 DIAGNOSIS — C155 Malignant neoplasm of lower third of esophagus: Secondary | ICD-10-CM

## 2016-09-14 DIAGNOSIS — D65 Disseminated intravascular coagulation [defibrination syndrome]: Secondary | ICD-10-CM | POA: Diagnosis not present

## 2016-09-14 DIAGNOSIS — F329 Major depressive disorder, single episode, unspecified: Secondary | ICD-10-CM

## 2016-09-14 DIAGNOSIS — D599 Acquired hemolytic anemia, unspecified: Secondary | ICD-10-CM

## 2016-09-14 DIAGNOSIS — J918 Pleural effusion in other conditions classified elsewhere: Secondary | ICD-10-CM

## 2016-09-14 DIAGNOSIS — Z7189 Other specified counseling: Secondary | ICD-10-CM

## 2016-09-14 DIAGNOSIS — C7951 Secondary malignant neoplasm of bone: Secondary | ICD-10-CM | POA: Diagnosis not present

## 2016-09-14 DIAGNOSIS — I10 Essential (primary) hypertension: Secondary | ICD-10-CM

## 2016-09-14 DIAGNOSIS — R630 Anorexia: Secondary | ICD-10-CM

## 2016-09-14 DIAGNOSIS — D6959 Other secondary thrombocytopenia: Secondary | ICD-10-CM

## 2016-09-14 LAB — CBC & DIFF AND RETIC
BASO%: 0.5 % (ref 0.0–2.0)
Basophils Absolute: 0 10*3/uL (ref 0.0–0.1)
EOS%: 2.1 % (ref 0.0–7.0)
Eosinophils Absolute: 0.1 10*3/uL (ref 0.0–0.5)
HCT: 29.2 % — ABNORMAL LOW (ref 38.4–49.9)
HGB: 9.6 g/dL — ABNORMAL LOW (ref 13.0–17.1)
IMMATURE RETIC FRACT: 29 % — AB (ref 3.00–10.60)
LYMPH%: 25.9 % (ref 14.0–49.0)
MCH: 31.5 pg (ref 27.2–33.4)
MCHC: 32.9 g/dL (ref 32.0–36.0)
MCV: 95.7 fL (ref 79.3–98.0)
MONO#: 0.9 10*3/uL (ref 0.1–0.9)
MONO%: 13.8 % (ref 0.0–14.0)
NEUT%: 57.7 % (ref 39.0–75.0)
NEUTROS ABS: 3.5 10*3/uL (ref 1.5–6.5)
Platelets: 211 10*3/uL (ref 140–400)
RBC: 3.05 10*6/uL — AB (ref 4.20–5.82)
RDW: 23.4 % — ABNORMAL HIGH (ref 11.0–14.6)
Retic %: 12.33 % — ABNORMAL HIGH (ref 0.80–1.80)
Retic Ct Abs: 376.07 10*3/uL — ABNORMAL HIGH (ref 34.80–93.90)
WBC: 6.1 10*3/uL (ref 4.0–10.3)
lymph#: 1.6 10*3/uL (ref 0.9–3.3)

## 2016-09-14 LAB — TECHNOLOGIST REVIEW

## 2016-09-14 NOTE — Telephone Encounter (Signed)
Appointments scheduled per 2/23 LOS. Patient given AVS report and calendars with future scheduled appointments. °

## 2016-09-17 ENCOUNTER — Encounter (HOSPITAL_COMMUNITY): Payer: Self-pay

## 2016-09-18 ENCOUNTER — Ambulatory Visit: Payer: Medicare Other

## 2016-09-18 ENCOUNTER — Ambulatory Visit (HOSPITAL_BASED_OUTPATIENT_CLINIC_OR_DEPARTMENT_OTHER): Payer: Non-veteran care

## 2016-09-18 ENCOUNTER — Other Ambulatory Visit (HOSPITAL_BASED_OUTPATIENT_CLINIC_OR_DEPARTMENT_OTHER): Payer: Non-veteran care

## 2016-09-18 VITALS — BP 110/62 | HR 94 | Temp 98.3°F | Resp 16

## 2016-09-18 DIAGNOSIS — C155 Malignant neoplasm of lower third of esophagus: Secondary | ICD-10-CM

## 2016-09-18 DIAGNOSIS — C7951 Secondary malignant neoplasm of bone: Secondary | ICD-10-CM | POA: Diagnosis not present

## 2016-09-18 DIAGNOSIS — Z5111 Encounter for antineoplastic chemotherapy: Secondary | ICD-10-CM | POA: Diagnosis not present

## 2016-09-18 LAB — COMPREHENSIVE METABOLIC PANEL
ALT: 17 U/L (ref 0–55)
ANION GAP: 10 meq/L (ref 3–11)
AST: 30 U/L (ref 5–34)
Albumin: 3.4 g/dL — ABNORMAL LOW (ref 3.5–5.0)
Alkaline Phosphatase: 617 U/L — ABNORMAL HIGH (ref 40–150)
BILIRUBIN TOTAL: 0.55 mg/dL (ref 0.20–1.20)
BUN: 14 mg/dL (ref 7.0–26.0)
CHLORIDE: 103 meq/L (ref 98–109)
CO2: 24 meq/L (ref 22–29)
CREATININE: 0.7 mg/dL (ref 0.7–1.3)
Calcium: 9.4 mg/dL (ref 8.4–10.4)
EGFR: >90 mL/min/{1.73_m2} (ref >90)
Glucose: 123 mg/dl (ref 70–140)
Potassium: 4.7 mEq/L (ref 3.5–5.1)
SODIUM: 138 meq/L (ref 136–145)
TOTAL PROTEIN: 6.7 g/dL (ref 6.4–8.3)

## 2016-09-18 LAB — CBC & DIFF AND RETIC
BASO%: 0.9 % (ref 0.0–2.0)
Basophils Absolute: 0.1 10*3/uL (ref 0.0–0.1)
EOS%: 1.4 % (ref 0.0–7.0)
Eosinophils Absolute: 0.1 10*3/uL (ref 0.0–0.5)
HCT: 31.1 % — ABNORMAL LOW (ref 38.4–49.9)
HGB: 10.1 g/dL — ABNORMAL LOW (ref 13.0–17.1)
IMMATURE RETIC FRACT: 24.8 % — AB (ref 3.00–10.60)
LYMPH#: 1.2 10*3/uL (ref 0.9–3.3)
LYMPH%: 19.9 % (ref 14.0–49.0)
MCH: 31.6 pg (ref 27.2–33.4)
MCHC: 32.5 g/dL (ref 32.0–36.0)
MCV: 97.2 fL (ref 79.3–98.0)
MONO#: 0.9 10*3/uL (ref 0.1–0.9)
MONO%: 15.5 % — ABNORMAL HIGH (ref 0.0–14.0)
NEUT#: 3.7 10*3/uL (ref 1.5–6.5)
NEUT%: 62.3 % (ref 39.0–75.0)
PLATELETS: 226 10*3/uL (ref 140–400)
RBC: 3.2 10*6/uL — AB (ref 4.20–5.82)
RDW: 22.9 % — AB (ref 11.0–14.6)
RETIC %: 10.87 % — AB (ref 0.80–1.80)
RETIC CT ABS: 347.84 10*3/uL — AB (ref 34.80–93.90)
WBC: 5.9 10*3/uL (ref 4.0–10.3)

## 2016-09-18 MED ORDER — OXALIPLATIN CHEMO INJECTION 100 MG/20ML
85.0000 mg/m2 | Freq: Once | INTRAVENOUS | Status: AC
  Administered 2016-09-18: 140 mg via INTRAVENOUS
  Filled 2016-09-18: qty 10

## 2016-09-18 MED ORDER — DEXTROSE 5 % IV SOLN
Freq: Once | INTRAVENOUS | Status: AC
  Administered 2016-09-18: 10:00:00 via INTRAVENOUS

## 2016-09-18 MED ORDER — PALONOSETRON HCL INJECTION 0.25 MG/5ML
INTRAVENOUS | Status: AC
  Filled 2016-09-18: qty 5

## 2016-09-18 MED ORDER — DEXAMETHASONE SODIUM PHOSPHATE 10 MG/ML IJ SOLN
10.0000 mg | Freq: Once | INTRAMUSCULAR | Status: AC
  Administered 2016-09-18: 10 mg via INTRAVENOUS

## 2016-09-18 MED ORDER — LEUCOVORIN CALCIUM INJECTION 350 MG
400.0000 mg/m2 | Freq: Once | INTRAVENOUS | Status: AC
  Administered 2016-09-18: 656 mg via INTRAVENOUS
  Filled 2016-09-18: qty 32.8

## 2016-09-18 MED ORDER — DEXAMETHASONE SODIUM PHOSPHATE 10 MG/ML IJ SOLN
INTRAMUSCULAR | Status: AC
  Filled 2016-09-18: qty 1

## 2016-09-18 MED ORDER — SODIUM CHLORIDE 0.9 % IV SOLN
2400.0000 mg/m2 | INTRAVENOUS | Status: DC
  Administered 2016-09-18: 3950 mg via INTRAVENOUS
  Filled 2016-09-18: qty 79

## 2016-09-18 MED ORDER — PALONOSETRON HCL INJECTION 0.25 MG/5ML
0.2500 mg | Freq: Once | INTRAVENOUS | Status: AC
  Administered 2016-09-18: 0.25 mg via INTRAVENOUS

## 2016-09-18 NOTE — Progress Notes (Addendum)
Nutrition Follow-up:   Spoke with patient and wife during infusion this am.  Wife reports that first part of the J tube is getting clogged with the 6 cans of tube feeding running via continuous pump.  Wife reports has had to take a toothpick to get the tube unclogged.  Patient does not have a feeding pump with continuous water flushes.   Patient no tolerance issues with the increase in tube feeding.  Reports that he is drinking 3 pint jars of water per day along with additional liquids.  Patient and wife have not added additional 121ml water flush during the day due to patient drinking well.  Wife reports some plegm in throat that she thinks makes him gag and due to allergies.  Patient eating few bites of soup during visit without problems.  Wife reports still not eating much orally.   Medications: reviewed  Labs: reviewed  Anthropometrics:   Noted weight at 132 pounds on 2/23.  Estimated Energy Needs  Kcals: 2030-2230 calories/d Protein: 85-100 g/d Fluid: 2.2 L/d  NUTRITION DIAGNOSIS: Unintentional weight loss improving   INTERVENTION:  Patient could benefit from feeding pump with automatic water flush capability.  Spoke with RD, Maudie Mercury at [facility] regarding suggestions for patient to try to keep tube from clogging. Maudie Mercury will ship out smaller syringes for patient to provide water flush (increased force will help to clean tube).  Talked with patient about frequent small water flush bolus during waking hours of 19 hour feeding as option.  Patient reports has tried smaller syringes before and they have not worked (was not aware of this when speaking with Lucky).  Patient and wife report they are not able to do more manuel water flush during waking hours as burdensome.  Have left additional message on Elsie, Kim's voicemail with patient and wife frustrations and have sent email message regarding patient and wife's concerns.     Will continue J tube feeding  of osmolite 1.5, 6 cans per day running continuously at 21ml/hr for 19 hours.  Wife will continue to provide 139ml water flush before and after feeding.  Patient will continue to drink at least 769ml of water orally.   Tube feeding regimen will provide 2133 calories, 89 g of protein and 2220 ml fluid (includes water flush, free water from formula and oral intake).    MONITORING, EVALUATION, GOAL: Patient will tolerate increased continuous feeding regimen to prevent weight loss.    NEXT VISIT: March 13th during infusion  Dorrie Cocuzza B. Zenia Resides, [place], [place] Registered Dietitian [phone] (pager)

## 2016-09-18 NOTE — Patient Instructions (Signed)
Return to [facility] on 09/20/2016 @ 11:15 am for pump disconnect.   Discharge Instructions for Patients Receiving Chemotherapy  Today you received the following chemotherapy agents:  Oxaliplatin, Leucovorin, and 5FU.  To help prevent nausea and vomiting after your treatment, we encourage you to take your nausea medication as directed.   If you develop nausea and vomiting that is not controlled by your nausea medication, call the clinic.   BELOW ARE SYMPTOMS THAT SHOULD BE REPORTED IMMEDIATELY:  *FEVER GREATER THAN 100.5 F  *CHILLS WITH OR WITHOUT FEVER  NAUSEA AND VOMITING THAT IS NOT CONTROLLED WITH YOUR NAUSEA MEDICATION  *UNUSUAL SHORTNESS OF BREATH  *UNUSUAL BRUISING OR BLEEDING  TENDERNESS IN MOUTH AND THROAT WITH OR WITHOUT PRESENCE OF ULCERS  *URINARY PROBLEMS  *BOWEL PROBLEMS  UNUSUAL RASH Items with * indicate a potential emergency and should be followed up as soon as possible.  Feel free to call the clinic you have any questions or concerns. The clinic phone number is [phone].  Please show the [place] at check-in to the Emergency Department and triage nurse.

## 2016-09-18 NOTE — Progress Notes (Signed)
Dr. Burr Medico aware of alk phos 617. Advises to proceed with treatment. No new orders.

## 2016-09-19 ENCOUNTER — Ambulatory Visit (HOSPITAL_COMMUNITY)
Admission: RE | Admit: 2016-09-19 | Discharge: 2016-09-19 | Disposition: A | Discharge status: Home / Self Care | Payer: Non-veteran care | Source: Ambulatory Visit | Attending: Nurse Practitioner | Admitting: Nurse Practitioner

## 2016-09-19 ENCOUNTER — Telehealth: Payer: Self-pay

## 2016-09-19 ENCOUNTER — Ambulatory Visit (HOSPITAL_COMMUNITY)
Admission: RE | Admit: 2016-09-19 | Discharge: 2016-09-19 | Disposition: A | Discharge status: Home / Self Care | Payer: Non-veteran care | Source: Ambulatory Visit | Attending: Radiology | Admitting: Radiology

## 2016-09-19 DIAGNOSIS — C155 Malignant neoplasm of lower third of esophagus: Secondary | ICD-10-CM | POA: Insufficient documentation

## 2016-09-19 DIAGNOSIS — J90 Pleural effusion, not elsewhere classified: Secondary | ICD-10-CM | POA: Diagnosis not present

## 2016-09-19 DIAGNOSIS — Z9889 Other specified postprocedural states: Secondary | ICD-10-CM | POA: Insufficient documentation

## 2016-09-19 NOTE — Procedures (Signed)
Ultrasound-guided diagnostic and therapeutic left thoracentesis performed yielding 1.5 liters of blood-tinged fluid. No immediate complications. Follow-up chest x-ray pending. A portion of the fluid was sent to the lab for cytology. Oncology team was notified of above.

## 2016-09-19 NOTE — Telephone Encounter (Signed)
Nutrition Follow-up:  Called patient to let him know that [facility] will be sending out dual bags for him to use for automatic water flush and tube feeding via pump.    Patient was in the car on his way to have thoracentesis this am.  Will mail new tube feeding regimen.  Osmolite 1.5 (6 cans per day) at rate of 76ml/hr for 19 hours continuous pump feeding.  Flush tube with 61ml of water before starting pump.  Program pump to deliver 92ml of water every hour for 19 hours (during tube feeding).  Flush tube with 29ml of water after feeding has stopped.  Patient will need to continue to drink at least 14 oz of water daily to keep hydrated.  Contact information provided.    Will plan to see patient on March 13th during infusion  Fred [name], [place], [place] Registered Dietitian [phone] (pager)

## 2016-09-20 ENCOUNTER — Ambulatory Visit (HOSPITAL_BASED_OUTPATIENT_CLINIC_OR_DEPARTMENT_OTHER): Payer: Non-veteran care

## 2016-09-20 VITALS — BP 120/54 | HR 109 | Temp 97.8°F

## 2016-09-20 DIAGNOSIS — C155 Malignant neoplasm of lower third of esophagus: Secondary | ICD-10-CM

## 2016-09-20 MED ORDER — HEPARIN SOD (PORK) LOCK FLUSH 100 UNIT/ML IV SOLN
500.0000 [IU] | Freq: Once | INTRAVENOUS | Status: AC | PRN
  Administered 2016-09-20: 500 [IU]
  Filled 2016-09-20: qty 5

## 2016-09-20 MED ORDER — SODIUM CHLORIDE 0.9% FLUSH
10.0000 mL | INTRAVENOUS | Status: DC | PRN
  Administered 2016-09-20: 10 mL
  Filled 2016-09-20: qty 10

## 2016-09-20 NOTE — Patient Instructions (Signed)
Implanted Port Home Guide An implanted port is a type of central line that is placed under the skin. Central lines are used to provide IV access when treatment or nutrition needs to be given through a person's veins. Implanted ports are used for long-term IV access. An implanted port may be placed because:  You need IV medicine that would be irritating to the small veins in your hands or arms.  You need long-term IV medicines, such as antibiotics.  You need IV nutrition for a long period.  You need frequent blood draws for lab tests.  You need dialysis.  Implanted ports are usually placed in the chest area, but they can also be placed in the upper arm, the abdomen, or the leg. An implanted port has two main parts:  Reservoir. The reservoir is round and will appear as a small, raised area under your skin. The reservoir is the part where a needle is inserted to give medicines or draw blood.  Catheter. The catheter is a thin, flexible tube that extends from the reservoir. The catheter is placed into a large vein. Medicine that is inserted into the reservoir goes into the catheter and then into the vein.  How will I care for my incision site? Do not get the incision site wet. Bathe or shower as directed by your health care provider. How is my port accessed? Special steps must be taken to access the port:  Before the port is accessed, a numbing cream can be placed on the skin. This helps numb the skin over the port site.  Your health care provider uses a sterile technique to access the port. ? Your health care provider must put on a mask and sterile gloves. ? The skin over your port is cleaned carefully with an antiseptic and allowed to dry. ? The port is gently pinched between sterile gloves, and a needle is inserted into the port.  Only "non-coring" port needles should be used to access the port. Once the port is accessed, a blood return should be checked. This helps ensure that the port  is in the vein and is not clogged.  If your port needs to remain accessed for a constant infusion, a clear (transparent) bandage will be placed over the needle site. The bandage and needle will need to be changed every week, or as directed by your health care provider.  Keep the bandage covering the needle clean and dry. Do not get it wet. Follow your health care provider's instructions on how to take a shower or bath while the port is accessed.  If your port does not need to stay accessed, no bandage is needed over the port.  What is flushing? Flushing helps keep the port from getting clogged. Follow your health care provider's instructions on how and when to flush the port. Ports are usually flushed with saline solution or a medicine called heparin. The need for flushing will depend on how the port is used.  If the port is used for intermittent medicines or blood draws, the port will need to be flushed: ? After medicines have been given. ? After blood has been drawn. ? As part of routine maintenance.  If a constant infusion is running, the port may not need to be flushed.  How long will my port stay implanted? The port can stay in for as long as your health care provider thinks it is needed. When it is time for the port to come out, surgery will be   done to remove it. The procedure is similar to the one performed when the port was put in. When should I seek immediate medical care? When you have an implanted port, you should seek immediate medical care if:  You notice a bad smell coming from the incision site.  You have swelling, redness, or drainage at the incision site.  You have more swelling or pain at the port site or the surrounding area.  You have a fever that is not controlled with medicine.  This information is not intended to replace advice given to you by your health care provider. Make sure you discuss any questions you have with your health care provider. Document  Released: 07/09/2005 Document Revised: 12/15/2015 Document Reviewed: 03/16/2013 [employer] Interactive Patient Education  2017 [employer]  

## 2016-09-25 NOTE — Progress Notes (Signed)
[place]  [phone] [phone]  Clinic Follow up Note   Patient Care Team: [name], MD as PCP - General (Internal Medicine) Truitt Merle, MD as Consulting Physician (Hematology) Kyung Rudd, MD as Consulting Physician (Radiation Oncology) [name], MD as Consulting Physician (Cardiothoracic Surgery) Karie Mainland, RD as Dietitian (Nutrition) Minus Breeding, MD as Consulting Physician (Cardiology)   CHIEF COMPLAINTS:  Follow up metastatic esophageal cancer    Oncology History   Presented with solid dysphagia at least daily with chest pain. Involuntary weight loss of 10-12 lb over past year  Malignant neoplasm of lower third of esophagus (HCC)   Staging form: Esophagus - Adenocarcinoma, AJCC 7th Edition   - Clinical stage from 12/15/2015: Stage IIIA (T3, N1, M0) - Signed by Truitt Merle, MD on 12/24/2015   - Pathologic stage from 04/26/2016: yT0, N0, cM0, GX - Signed by [name], MD on 04/27/2016      Malignant neoplasm of lower third of esophagus ([place])   11/23/2015 Procedure    EGD per [facility](Dr. Toledo):6cm mass in lower third of esophagus      11/24/2015 Initial Diagnosis    Esophageal cancer ([place])      11/24/2015 Pathology Results    Mucinous adenocarcinoma, moderately differentiated-Her2 analysis pending      12/05/2015 Imaging    PET scan showed hypermetabolic a mass in distal esophagus, hypermetabolic activity in the left anterior prostate gland, metabolic density in left adrenal nodule, CT attenuation suggest a benign adrenal adenoma. Left apical 2 mm lung nodule.      12/15/2015 Procedure    EUS showed a uT3N1 lesion from 27cm to 35cm from the incisors       12/26/2015 - 02/02/2016 Chemotherapy    Weekly carboplatin AUC 2 and Taxol 45 mg/m, with concurrent radiation      12/26/2015 - 02/02/2016 Radiation Therapy    Neoadjuvant chemoradiation to the esophageal cancer      04/23/2016 Surgery     total esophagectomy with cervical esophagogastrostomy, pyloroplasty, feeding jejunostomy       04/23/2016 Pathology Results    Esophagectomy showed no residual tumor, 8 lymph nodes were all negative, incidental leiomyoma 0.2 cm      08/14/2016 PET scan    IMPRESSION: 1. Small hypermetabolic left adrenal mass, this has not changed in size compared to 12/01/15 but has a maximum standard uptake value of 6.2 (previously 4.2). Indeterminate density and MRI characteristics, not specific for adenoma. Early metastatic disease is not readily excluded although the lack of change of size is somewhat reassuring. Surveillance recommended. 2. No other hypermetabolic lesions are identified. 3. Moderate to large left pleural effusion with passive atelectasis. 4. Esophagectomy with gastric pull-through. 5. No hypermetabolic liver lesions are seen. 6. Enlarged prostate gland.      08/16/2016 - 08/23/2016 Hospital Admission    He was diagnosed with DIC, unclear etiology. He was given blood products. He underwent echocardiogram which was negative for endocarditis, CTA chest/abd which was negative for aneurysm or dissection as work up for DIC. He was on prednisone for some time, but stopped as it was not helping much for DIC.       08/28/2016 - 09/01/2016 Hospital Admission    He has had issues with recurrent L sided pleural effusions, DIC and melena. He was last admitted in 1/25, given supportive care with a thoracentesis and blood transfusion. Dr Burr Medico has been following DIC and has requested a direct admit for melena,  anemia and thrombocytopenia. GI called for EGD.        08/31/2016 Pathology Results    Bone Marrow Biopsy, right iliac - METASTATIC ADENOCARCINOMA. - SEE COMMENT. PERIPHERAL BLOOD: - NORMOCYTIC ANEMIA. - THROMBOCYTOPENIA.      08/31/2016 Progression    Bone marrow biopsy showed metastatic adenocarcinoma      09/04/2016 -  Chemotherapy    FOLFOX every 2 weeks       09/19/2016 Procedure     Therapeutic thoracentesis of a left pleural effusion performed on 09/19/16 yielded 1.5 liters of blood-tinged fluid. Reactive appearing mesothelial cells were present.       HISTORY OF PRESENTING ILLNESS (12/14/2015):  ANGELOS WASCO 76 y.o. male is here because of His newly diagnosed esophageal cancer. He is accompanied by his wife and daughter to my clinic today.  He has had dysphagia for 2 months, mainly with solid food, no dysphagia with soft food or liquid. He also has mild pain in mid chest when he swallows. No nausea, abdominal pain, or other discomfort. He has lost about 15 lbs in the last year, no other symoptoms. He was seen by his primary care physician at [facility], and referred to gastroenterologist Dr. Alice Reichert. He underwent EGD on 11/23/2015, which showed a 6 cm mass in the lower third of esophagus. Biopsy showed adenocarcinoma, moderately differentiated. He was referred to [place] to consider neoadjuvant therapy. He is scheduled to see a Psychologist, sport and exercise at [facility] later this week.  He has had some urinary frequency, underwent TURP on 11/02/2015.   CURRENT THERAPY: first line chemo FOLFOX every 2 weeks started on 09/04/2016  INTERIM HISTORY: Mr. Tallarico returns for follow up with his wife. Therapeutic thoracentesis of a left pleural effusion performed on 09/19/16 yielded 1.5 liters of blood-tinged fluid. He presents in a wheelchair. He feels pretty good, but doesn't have much energy to do anything. Reactive appearing mesothelial cells were present. He reports his temperature continues to rise and fall. Urinalysis, blood culture, and urine culture from 09/26/16 were negative. He feels better after getting fluids last week. Reports trying to stay hydrated. Reports urinating and bowel movements without issues. He denies SOB at this time. The patient saw Dr. Servando Snare on 09/27/16.   MEDICAL HISTORY:  Past Medical History:  Diagnosis Date  . Esophageal cancer ([place]) 11/23/15   lower 3rd esohagus   . GERD  (gastroesophageal reflux disease)   . Hyperlipidemia   . Hypertension     SURGICAL HISTORY: Past Surgical History:  Procedure Laterality Date  . CHEST TUBE INSERTION Left 04/23/2016   Procedure: CHEST TUBE INSERTION;  Surgeon: [name], MD;  Location: [place];  Service: Thoracic;  Laterality: Left;  . COLONOSCOPY N/A 08/30/2016   Procedure: COLONOSCOPY;  Surgeon: Milus Banister, MD;  Location: [facility];  Service: Endoscopy;  Laterality: N/A;  . COMPLETE ESOPHAGECTOMY N/A 04/23/2016   Procedure: TRANSHIATIAL TOTAL ESOPHAGECTOMY COMPLETE; CERVICAL ESOPHAGOGASTROSTOMY AND PYLOROPLASTY;  Surgeon: [name], MD;  Location: [place];  Service: Thoracic;  Laterality: N/A;  . cystoscope  4/12/1   prostate  . ERCP    . ESOPHAGOGASTRODUODENOSCOPY (EGD) WITH PROPOFOL N/A 08/29/2016   Procedure: ESOPHAGOGASTRODUODENOSCOPY (EGD) WITH PROPOFOL;  Surgeon: Milus Banister, MD;  Location: [facility];  Service: Endoscopy;  Laterality: N/A;  . INGUINAL HERNIA REPAIR    . IR GENERIC HISTORICAL  08/31/2016   IR FLUORO GUIDE PORT INSERTION RIGHT 08/31/2016 Corrie Mckusick, DO [facility]-INTER[facility]  . IR GENERIC HISTORICAL  08/31/2016  IR US GUIDE VASC ACCESS RIGHT 08/31/2016 Corrie Mckusick, DO [facility]  . JEJUNOSTOMY N/A 04/23/2016   Procedure: FEEDING JEJUNOSTOMY;  Surgeon: [name], MD;  Location: [place];  Service: Thoracic;  Laterality: N/A;  . LEG SURGERY Left    hole  . PROSTATE BIOPSY  10/05/15  . right shoulder surgery    . VIDEO BRONCHOSCOPY N/A 04/23/2016   Procedure: VIDEO BRONCHOSCOPY;  Surgeon: [name], MD;  Location: [facility] OR;  Service: Thoracic;  Laterality: N/A;    SOCIAL HISTORY: Social History   Social History  . Marital status: Married    Spouse name: N/A  . Number of children: 1  . Years of education: N/A   Occupational History  . Truck Geophysicist/field seismologist    Social History Main Topics  . Smoking status: Former Smoker    Packs/day: 1.00    Years: 10.00    Quit date:  07/24/1967  . Smokeless tobacco: Never Used  . Alcohol use No  . Drug use: No  . Sexual activity: Not Currently   Other Topics Concern  . Not on file   Social History Narrative  . No narrative on file    FAMILY HISTORY: Family History  Problem Relation Age of Onset  . Cancer Maternal Grandfather 90    gastric cancer   . Alzheimer's disease Mother   . Diabetes Mother   . Stroke Father 104  . Multiple sclerosis Sister     ALLERGIES:  is allergic to no known allergies.  MEDICATIONS:  Current Outpatient Prescriptions  Medication Sig Dispense Refill  . acetaminophen (TYLENOL) 325 MG tablet Take 650 mg by mouth every 6 (six) hours as needed.    Marland Kitchen atenolol (TENORMIN) 25 MG tablet Take 1 tab in the morning and half tab in the evening (Patient taking differently: Take 12.5-25 mg by mouth 2 (two) times daily. Take 62m in the morning and 12.542min the evening) 60 tablet 1  . diphenhydrAMINE (BENADRYL) 25 MG tablet Take 25 mg by mouth every 6 (six) hours as needed.    . folic acid (FOLVITE) 1 MG tablet Take 1 tablet (1 mg total) by mouth daily. 30 tablet 0  . lidocaine-prilocaine (EMLA) cream Apply 1 application topically as needed. Apply 1-2 tsp over port site 1 hour prior to chemotherapy 30 g 1  . mirtazapine (REMERON) 15 MG tablet Take 1 tablet (15 mg total) by mouth at bedtime. 30 tablet 2  . Nutritional Supplements (FEEDING SUPPLEMENT, OSMOLITE 1.5 CAL,) LIQD Increase osmolite 1.5 to 6 cans to be infused via continuous pump for 19 hours per day. Flush with 18044mf water before and after continuous feeding.  Give an additional 180m4m water via tube.  Drink an additional 600ml58mwater during the day.  Send formula and bags. 1422 mL 0  . ondansetron (ZOFRAN ODT) 8 MG disintegrating tablet Take 1 tablet (8 mg total) by mouth every 8 (eight) hours as needed for nausea or vomiting. 30 tablet 2  . prochlorperazine (COMPAZINE) 10 MG tablet Take 1 tablet (10 mg total) by mouth every 6 (six)  hours as needed for nausea or vomiting. 30 tablet 3  . vitamin B-12 1000 MCG tablet Take 1 tablet (1,000 mcg total) by mouth daily. 30 tablet 0  . Water For Irrigation, Sterile (FREE WATER) SOLN Place 220 mLs into feeding tube 4 (four) times daily.    . oxyMarland Kitchenodone (OXY-IR) 5 MG capsule Take 5 mg by mouth every 6 (six) hours as  needed for pain.      No current facility-administered medications for this visit.    Facility-Administered Medications Ordered in Other Visits  Medication Dose Route Frequency Provider Last Rate Last Dose  . dexamethasone (DECADRON) injection 10 mg  10 mg Intravenous Once Truitt Merle, MD      . dextrose 5 % solution   Intravenous Once Truitt Merle, MD      . fluorouracil (ADRUCIL) 3,300 mg in sodium chloride 0.9 % 84 mL chemo infusion  2,000 mg/m2 (Treatment Plan Recorded) Intravenous 1 day or 1 dose Truitt Merle, MD      . heparin lock flush 100 unit/mL  500 Units Intracatheter Once PRN Truitt Merle, MD      . leucovorin 656 mg in dextrose 5 % 250 mL infusion  400 mg/m2 (Treatment Plan Recorded) Intravenous Once Truitt Merle, MD      . oxaliplatin (ELOXATIN) 100 mg in dextrose 5 % 500 mL chemo infusion  60 mg/m2 (Treatment Plan Recorded) Intravenous Once Truitt Merle, MD      . palonosetron (ALOXI) injection 0.25 mg  0.25 mg Intravenous Once Truitt Merle, MD      . sodium chloride flush (NS) 0.9 % injection 10 mL  10 mL Intracatheter PRN Truitt Merle, MD        REVIEW OF SYSTEMS:   Constitutional: Denies fevers, chills or abnormal night sweats, (+) very fatigued Eyes: Denies blurriness of vision, double vision or watery eyes Ears, nose, mouth, throat, and face: Denies mucositis or sore throat Respiratory: Denies cough, dyspnea or wheezes Cardiovascular: Denies palpitation, chest discomfort or lower extremity swelling Gastrointestinal:  Denies heartburn or change in bowel habits Skin: Denies abnormal skin rashes Lymphatics: Denies new lymphadenopathy or easy bruising Neurological:Denies numbness,  tingling or new weaknesses Behavioral/Psych: Mood is stable, no new changes  Musculoskeletal: (+) L hip pain All other systems were reviewed with the patient and are negative.  PHYSICAL EXAMINATION: ECOG PERFORMANCE STATUS: 2  BP (!) 106/54 (BP Location: Left Arm, Patient Position: Sitting)   Pulse 100   Temp 97.9 F (36.6 C) (Oral)   Resp 18   Ht 5' 5"  (1.651 m)   Wt 131 lb 11.2 oz (59.7 kg)   SpO2 100%   BMI 21.92 kg/m    GENERAL:alert, no distress and comfortable, in wheelchair SKIN: Appears to be pale, skin texture, turgor are normal, no rashes or significant lesions EYES: normal, conjunctiva are pink and non-injected, sclera clear OROPHARYNX:no exudate, no erythema and lips, buccal mucosa, and tongue normal  NECK: supple, thyroid normal size, non-tender, without nodularity LYMPH:  no palpable lymphadenopathy in the cervical, axillary or inguinal LUNGS: clear to auscultation and percussion with normal breathing effort (+) except decreased breath sound at the left ung base. HEART: regular rate & rhythm and no murmurs and no lower extremity edema ABDOMEN:abdomen soft, non-tender and normal bowel sounds, J tube in place. No skin erythema or pus. (+) Hepatomegaly, liver is palpable 2-3 cm below the rib cage, nontender. Musculoskeletal:no cyanosis of digits and no clubbing  PSYCH: alert & oriented x 3 with fluent speech NEURO: no focal motor/sensory deficits  LABORATORY DATA:  I have reviewed the data as listed CBC Latest Ref Rng & Units 10/02/2016 09/26/2016 09/18/2016  WBC 4.0 - 10.3 10e3/uL 4.3 5.6 5.9  Hemoglobin 13.0 - 17.1 g/dL 10.0(L) 10.7(L) 10.1(L)  Hematocrit 38.4 - 49.9 % 29.6(L) 31.7(L) 31.1(L)  Platelets 140 - 400 10e3/uL 289 232 226   CMP Latest Ref Rng & Units  10/02/2016 09/26/2016 09/18/2016  Glucose 70 - 140 mg/dl 112 138 123  BUN 7.0 - 26.0 mg/dL 12.8 13.1 14.0  Creatinine 0.7 - 1.3 mg/dL 0.6(L) 0.6(L) 0.7  Sodium 136 - 145 mEq/L 137 134(L) 138  Potassium 3.5 -  5.1 mEq/L 4.3 4.5 4.7  Chloride 101 - 111 mmol/L - - -  CO2 22 - 29 mEq/L 25 27 24   Calcium 8.4 - 10.4 mg/dL 9.3 9.2 9.4  Total Protein 6.4 - 8.3 g/dL 6.5 6.4 6.7  Total Bilirubin 0.20 - 1.20 mg/dL 0.40 0.60 0.55  Alkaline Phos 40 - 150 U/L 284(H) 404(H) 617(H)  AST 5 - 34 U/L 41(H) 33 30  ALT 0 - 55 U/L 38 21 17   Results for DIRK, VANAMAN (MRN [id number]) as of 09/25/2016 08:08  Ref. Range 12/14/2015 14:55 12/14/2015 16:52 01/23/2016 08:51 08/09/2016 13:04 09/07/2016 10:56  CA 19-9 Latest Ref Range: 0 - 35 U/mL 38 (H)      CEA Latest Ref Range: 0.0 - 4.7 ng/mL 28.5 (H)   2,382.0 (H) 4,056.0 (H)  CEA ([facility]) Latest Ref Range: 0.00 - 5.00 ng/mL    1,731.10 x 2 dilu... (H) 2,612.34 Result C... (H)  PSA Latest Ref Range: 0.0 - 4.0 ng/mL  2.4 3.0     Pathology report  Diagnosis 09/19/2016 PLEURAL FLUID, LEFT (SPECIMEN 1 OF 1 COLLECTED 09/19/16): REACTIVE APPEARING MESOTHELIAL CELLS PRESENT.   [facility] 08/31/2016   Diagnosis 08/31/2016 Bone Marrow Biopsy, right iliac - METASTATIC ADENOCARCINOMA. - SEE COMMENT. PERIPHERAL BLOOD: - NORMOCYTIC ANEMIA. - THROMBOCYTOPENIA.  Diagnosis 08/02/16 PLEURAL FLUID, LEFT (SPECIMEN 1 OF 1 COLLECTED 08/02/16): REACTIVE MESOTHELIAL CELLS PRESENT. LYMPHOCYTES PRESENT.  Diagnosis 11/23/2015 Mass, lower third of the esophagus, mucosal biopsy Mucinous adenocarcinoma, moderately differentiated.  Diagnosis 04/23/2016 1. Lymph node, biopsy, Cervical - ONE OF ONE LYMPH NODES NEGATIVE FOR CARCINOMA (0/1). 2. Lymph node, biopsy, Periesophageal - BLOOD WITH SCANT BENIGN SOFT TISSUE AND SKELETAL MUSCLE. - NO MALIGNANCY IDENTIFIED. 3. Esophagogastrectomy - ULCERATION WITH INFLAMMATION AND REACTIVE CHANGES. - FOCAL INTESTINAL METAPLASIA. - CHANGES CONSISTENT WITH TREATMENT EFFECT. - SEVEN OF SEVEN LYMPH NODES NEGATIVE FOR CARCINOMA (0/7). - INCIDENTAL LEIOMYOMA, 0.2 CM. - MARGINS ARE NEGATIVE FOR DYSPLASIA OR MALIGNANCY. - SEE ONCOLOGY  TABLE. Microscopic Comment 3. ESOPHAGUS: Specimen: Esophagus and proximal stomach, cervical lymph node. Procedure: Esophagogastrectomy and cervical lymph node biopsy. Tumor Site: Presumed GE junction, see comment. Relationship of Tumor to esophagogastric junction: Can not be assessed. Distance of tumor center from esophagogastric junction: Can not be assessed. Tumor Size Greatest dimension: No residual tumor present. Histologic Type: Per medical record adenocarcinoma (no biopsy for review). Histologic Grade: N/A. Microscopic Tumor Extension: N/A. Margins: Negative. Treatment Effect: Present (TRS 0). Lymph-Vascular Invasion: Not identified. Perineural Invasion: Not identified. Lymph nodes: number examined 8; number positive: 0 TNM: ypT0, ypN0 see comment. 1 of 3 FINAL for WILLIS, HOLQUIN ([id number]) Microscopic Comment(continued) Ancillary studies: None. Comments: The entire GE junction is submitted for evaluation. There is ulceration with associated inflammation. There is acellular mucin/myxoid changes which extend through the muscularis propria, but no viable/residual tumor is identified and thus the stage is ypT0. There is focal background intestinal metaplasia, which may have been pre-existing (Barrett's esophagus) or related to therapy.   RADIOGRAPHIC STUDIES: I have personally reviewed the radiological images as listed and agreed with the findings in the report. Dg Chest 1 View  Result Date: 09/19/2016 CLINICAL DATA:  Status post left thoracentesis today. EXAM: CHEST 1 VIEW COMPARISON:  PA and lateral  chest 09/11/2016. FINDINGS: Left pleural effusion is markedly decreased after thoracentesis. No pneumothorax. Right lung is expanded and clear. Postoperative change of esophagectomy and gastric pull-through noted. Heart size is normal. Port-A-Cath is in place. Jejunostomy tube is partially visualized. IMPRESSION: Marked decrease in left pleural effusion after thoracentesis.  Negative for pneumothorax. No new abnormality. Electronically Signed   By: Inge Rise M.D.   On: 09/19/2016 11:02   Dg Chest 2 View  Result Date: 09/11/2016 CLINICAL DATA:  Malignant neoplasm of lower third of esophagus. F/u left pleural effusion. Ex-smoker. Sob for 2 days. Hx htn. EXAM: CHEST  2 VIEW COMPARISON:  08/28/2016 FINDINGS: Moderate left pleural effusion obscures the left hemidiaphragm and portions of the left heart border, without significant change from the recent prior study. No right pleural effusion. There is some dependent opacity at the left lung base adjacent to the pleural effusion consistent with atelectasis. Lungs are otherwise clear. No pneumothorax. Cardiac silhouette is normal in size. No mediastinal or hilar masses. No convincing adenopathy. There is a new right anterior chest wall Port-A-Cath extending through the right internal jugular vein to have its tip in the lower superior vena cava. Mild to moderate compression deformity noted of what appears to be L1. This is stable from a lateral chest radiograph dated 07/26/2016. IMPRESSION: 1. Persistent moderate left pleural effusion with associated dependent compressive atelectasis. 2. No evidence of pneumonia. No pulmonary edema. Lungs are otherwise clear. 3. Right anterior chest wall Port-A-Cath is well positioned. No other change from most recent prior study. Electronically Signed   By: Lajean Manes M.D.   On: 09/11/2016 14:29   US Thoracentesis Asp Pleural Space W/img Guide  Result Date: 09/19/2016 INDICATION: Patient with history of metastatic esophageal cancer, dyspnea, recurrent left pleural effusion. Request made for diagnostic and therapeutic left thoracentesis. EXAM: ULTRASOUND GUIDED DIAGNOSTIC AND THERAPEUTIC LEFT THORACENTESIS MEDICATIONS: None. COMPLICATIONS: None immediate. PROCEDURE: An ultrasound guided thoracentesis was thoroughly discussed with the patient and questions answered. The benefits, risks,  alternatives and complications were also discussed. The patient understands and wishes to proceed with the procedure. Written consent was obtained. Ultrasound was performed to localize and mark an adequate pocket of fluid in the left chest. The area was then prepped and draped in the normal sterile fashion. 1% Lidocaine was used for local anesthesia. Under ultrasound guidance a Safe-T-Centesis catheter was introduced. Thoracentesis was performed. The catheter was removed and a dressing applied. FINDINGS: A total of approximately 1.5 liters of blood-tinged fluid was removed. Samples were sent to the laboratory as requested by the clinical team. The Oncology service was notified of the above findings. IMPRESSION: Successful ultrasound guided diagnostic and therapeutic left thoracentesis yielding 1.5 liters of pleural fluid. Read by: Rowe Robert, PA-C Electronically Signed   By: Lucrezia Europe M.D.   On: 09/19/2016 12:37   PET 08/14/2016 IMPRESSION: 1. Small hypermetabolic left adrenal mass, this has not changed in size compared to 12/01/15 but has a maximum standard uptake value of 6.2 (previously 4.2). Indeterminate density and MRI characteristics, not specific for adenoma. Early metastatic disease is not readily excluded although the lack of change of size is somewhat reassuring. Surveillance recommended. 2. No other hypermetabolic lesions are identified. 3. Moderate to large left pleural effusion with passive atelectasis. 4. Esophagectomy with gastric pull-through. 5. No hypermetabolic liver lesions are seen. 6. Enlarged prostate gland.  CT angio chest/abdomen/pelvis for dissection w/wo contrast 08/22/2016 IMPRESSION: No evidence of aortic aneurysm or dissection. No evidence of pulmonary embolus. Heart is  borderline in size. Stable small left adrenal nodule which was hypermetabolic on prior PET CT. Stable large left pleural effusion with compressive atelectasis in the left lower lobe. Stable  appearance of the gastric pull-through post esophagectomy.  ASSESSMENT & PLAN:  76 y.o. male presented with dysphagia with solid food  1. Distal esophageal adenocarcinoma, uT3N1M0, stage IIIA, ypT0N0M0, diffuse bone metastasis 08/2016 -I previously reviewed his CT scan, PET scan, EGD, and the biopsy findings with patient and his family member in details. -The initial PET scan showed no definitive distant metastasis. 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 This will be followed in the future scan. -By EUS, he had T3 N1 stage III disease. -He completed neoadjuvant radiation with weekly Carbo and Taxol. -I previously reviewed his surgical pathology findings, he has had complete pathologic response to neoadjuvant chemotherapy and radiation, which predicts good prognosis -Unfortunately he developed diffuse bone metastasis in January 2018, confirmed by bone marrow biopsy -He has started first-line chemotherapy FOLFOX, tolerating well, and his anemia and some cytopenia has improved since chemotherapy -The goal of therapy is palliative -I have requested HER2 test and Foundation one on his initial esophageal mass biopsy from outside lab to see if he would benefit from Herceptin and immunotherapy. Unfortunately, there was insufficient tissue. -He is clinically doing better, still quite fatigued from his recent infection, will continue chemotherapy every 2 weeks. -We discussed his clinical response may be difficult to evaluate since his PET scan was negative when he had a bone metastasis, we'll based on the blood counts, and CEA level, also repeat PET scan every 3 months to evaluate his response.   2. 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 thrombocytopenia has resolved. -The patient had many blood transfusions with the last on 09/28/16, he has not required any  blood transfusion since he started chemotherapy. -Continue monitoring CBC  3. 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  -He has had 3 repeated thoracentesis, all cytology were negative, -Therapeutic thoracentesis performed on 09/19/16 yielded 1.5 liters of blood-tinged fluid. Reactive appearing mesothelial cells were present. -Continue monitoring  4. HTN -Continue medication and follow-up of his primary care physician  5. Malnutrition, nausea/vomiting, and weight loss -Doing better, he is tolerating J-tube feeding very well, he eats normally  -5 cans a day -Follow up with dietitian  6. Anorexia and depression  -continue mirtazapine.  -He hasn't talked to the nutritionist in a while, he will follow up   7. Fatigue -He would like something to help his energy. -We discussed steroids to help for this, but I told them that this is not a long term solution -He would not like steroids at this time.   8. 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 -I recommend DNR/DNI, he will think about it   9. Fever -He had episodes of fever last week, blood, urine cultures were negative, reviewed with patient -His fever has resolved after a course of antibiotics -We'll continue monitoring.  Plan -Lab reviewed, adequate for treatment today. Continue FOLFOX every 2 weeks, reduce dose today due to a recent infection. -Lab, flush, and FOLFOX in 2 and 4 weeks -NS 535m today with chemo   All questions  were answered. The patient knows to call the clinic with any problems, questions or concerns.  I spent 25 minutes counseling the patient face to face. The total time spent in the appointment was 30 minutes and more than 50% was on counseling.   Truitt Merle, MD 10/02/2016    This document serves as a record of services personally performed by Truitt Merle, MD. It was created on her behalf by Darcus Austin, a trained medical scribe. The creation of this record is based on the scribe's personal observations and the provider's statements to them. This document has been checked and approved by the attending provider.

## 2016-09-26 ENCOUNTER — Ambulatory Visit (HOSPITAL_BASED_OUTPATIENT_CLINIC_OR_DEPARTMENT_OTHER): Payer: No Typology Code available for payment source

## 2016-09-26 ENCOUNTER — Telehealth: Payer: Self-pay | Admitting: *Deleted

## 2016-09-26 ENCOUNTER — Encounter: Payer: Self-pay | Admitting: Hematology

## 2016-09-26 ENCOUNTER — Ambulatory Visit (HOSPITAL_BASED_OUTPATIENT_CLINIC_OR_DEPARTMENT_OTHER): Payer: No Typology Code available for payment source | Admitting: Nurse Practitioner

## 2016-09-26 ENCOUNTER — Ambulatory Visit (HOSPITAL_BASED_OUTPATIENT_CLINIC_OR_DEPARTMENT_OTHER): Payer: No Typology Code available for payment source | Admitting: Hematology

## 2016-09-26 VITALS — BP 122/62 | HR 114 | Temp 100.0°F | Resp 18 | Ht 65.0 in | Wt 133.1 lb

## 2016-09-26 DIAGNOSIS — R5081 Fever presenting with conditions classified elsewhere: Secondary | ICD-10-CM

## 2016-09-26 DIAGNOSIS — C155 Malignant neoplasm of lower third of esophagus: Secondary | ICD-10-CM | POA: Diagnosis not present

## 2016-09-26 DIAGNOSIS — R634 Abnormal weight loss: Secondary | ICD-10-CM | POA: Diagnosis not present

## 2016-09-26 LAB — CBC & DIFF AND RETIC
BASO%: 0.4 % (ref 0.0–2.0)
Basophils Absolute: 0 10*3/uL (ref 0.0–0.1)
EOS ABS: 0.1 10*3/uL (ref 0.0–0.5)
EOS%: 1.4 % (ref 0.0–7.0)
HCT: 31.7 % — ABNORMAL LOW (ref 38.4–49.9)
HEMOGLOBIN: 10.7 g/dL — AB (ref 13.0–17.1)
Immature Retic Fract: 28.6 % — ABNORMAL HIGH (ref 3.00–10.60)
LYMPH%: 16.8 % (ref 14.0–49.0)
MCH: 32.3 pg (ref 27.2–33.4)
MCHC: 33.8 g/dL (ref 32.0–36.0)
MCV: 95.8 fL (ref 79.3–98.0)
MONO#: 1 10*3/uL — AB (ref 0.1–0.9)
MONO%: 17.5 % — AB (ref 0.0–14.0)
NEUT%: 63.9 % (ref 39.0–75.0)
NEUTROS ABS: 3.6 10*3/uL (ref 1.5–6.5)
Platelets: 232 10*3/uL (ref 140–400)
RBC: 3.31 10*6/uL — AB (ref 4.20–5.82)
RDW: 20.9 % — AB (ref 11.0–14.6)
RETIC %: 6.26 % — AB (ref 0.80–1.80)
Retic Ct Abs: 207.21 10*3/uL — ABNORMAL HIGH (ref 34.80–93.90)
WBC: 5.6 10*3/uL (ref 4.0–10.3)
lymph#: 0.9 10*3/uL (ref 0.9–3.3)

## 2016-09-26 LAB — COMPREHENSIVE METABOLIC PANEL
ALT: 21 U/L (ref 0–55)
ANION GAP: 9 meq/L (ref 3–11)
AST: 33 U/L (ref 5–34)
Albumin: 3 g/dL — ABNORMAL LOW (ref 3.5–5.0)
Alkaline Phosphatase: 404 U/L — ABNORMAL HIGH (ref 40–150)
BUN: 13.1 mg/dL (ref 7.0–26.0)
CALCIUM: 9.2 mg/dL (ref 8.4–10.4)
CHLORIDE: 99 meq/L (ref 98–109)
CO2: 27 mEq/L (ref 22–29)
CREATININE: 0.6 mg/dL — AB (ref 0.7–1.3)
EGFR: >90 mL/min/{1.73_m2} (ref >90)
Glucose: 138 mg/dl (ref 70–140)
POTASSIUM: 4.5 meq/L (ref 3.5–5.1)
Sodium: 134 mEq/L — ABNORMAL LOW (ref 136–145)
Total Bilirubin: 0.6 mg/dL (ref 0.20–1.20)
Total Protein: 6.4 g/dL (ref 6.4–8.3)

## 2016-09-26 LAB — URINALYSIS, MICROSCOPIC - CHCC
Bilirubin (Urine): NEGATIVE
GLUCOSE UR CHCC: NEGATIVE mg/dL
KETONES: NEGATIVE mg/dL
LEUKOCYTE ESTERASE: NEGATIVE
Nitrite: NEGATIVE
PH: 7 (ref 4.6–8.0)
PROTEIN: 100 mg/dL
SPECIFIC GRAVITY, URINE: 1.01 (ref 1.003–1.035)
Urobilinogen, UR: 0.2 mg/dL (ref 0.2–1)

## 2016-09-26 MED ORDER — HEPARIN SOD (PORK) LOCK FLUSH 100 UNIT/ML IV SOLN
500.0000 [IU] | Freq: Once | INTRAVENOUS | Status: AC
  Administered 2016-09-26: 500 [IU] via INTRAVENOUS
  Filled 2016-09-26: qty 5

## 2016-09-26 MED ORDER — SODIUM CHLORIDE 0.9% FLUSH
10.0000 mL | Freq: Once | INTRAVENOUS | Status: AC
  Administered 2016-09-26: 10 mL via INTRAVENOUS
  Filled 2016-09-26: qty 10

## 2016-09-26 MED ORDER — SODIUM CHLORIDE 0.9 % IV SOLN
1000.0000 mL | Freq: Once | INTRAVENOUS | Status: AC
  Administered 2016-09-26: 500 mL via INTRAVENOUS

## 2016-09-26 MED ORDER — LEVOFLOXACIN 750 MG PO TABS: 750.0000 mg | ORAL_TABLET | Freq: Every day | ORAL | 0 refills | Status: DC

## 2016-09-26 NOTE — Progress Notes (Signed)
RN Visit only.  Urine specimen obtained for u/a and culture.

## 2016-09-26 NOTE — Telephone Encounter (Signed)
Received call from pt's wife stating that pt had fever of 100.5 yest & took tylenol & temp went down but back up during hs to 101 & 101.3 now this am.  He denies any other symptoms.  He feels cold when fever is up but no chills.  Informed Dr Burr Medico & then informed pt to come in for labs & see Dr Burr Medico @ 1:15 pm today.  Message to scheduler.

## 2016-09-26 NOTE — Patient Instructions (Signed)
Fever, Adult A fever is an increase in the body's temperature. It is often defined as a temperature of 100 F (38C) or higher. Short mild or moderate fevers often have no long-term effects. They also often do not need treatment. Moderate or high fevers may make you feel uncomfortable. Sometimes, they can also be a sign of a serious illness or disease. The sweating that may happen with repeated fevers or fevers that last a while may also cause you to not have enough fluid in your body (dehydration). You can take your temperature with a thermometer to see if you have a fever. A measured temperature can change with:  Age.  Time of day.  Where the thermometer is placed:  Mouth (oral).  Rectum (rectal).  Ear (tympanic).  Underarm (axillary).  Forehead (temporal). Follow these instructions at home: Pay attention to any changes in your symptoms. Take these actions to help with your condition:  Take over-the-counter and prescription medicines only as told by your doctor. Follow the dosing instructions carefully.  If you were prescribed an antibiotic medicine, take it as told by your doctor. Do not stop taking the antibiotic even if you start to feel better.  Rest as needed.  Drink enough fluid to keep your pee (urine) clear or pale yellow.  Sponge yourself or bathe with room-temperature water as needed. This helps to lower your body temperature . Do not use ice water.  Do not wear too many blankets or heavy clothes. Contact a doctor if:  You throw up (vomit).  You cannot eat or drink without throwing up.  You have watery poop (diarrhea).  It hurts when you pee.  Your symptoms do not get better with treatment.  You have new symptoms.  You feel very weak. Get help right away if:  You are short of breath or have trouble breathing.  You are dizzy or you pass out (faint).  You feel confused.  You have signs of not having enough fluid in your body, such as:  A dry  mouth.  Peeing less.  Looking pale.  You have very bad pain in your belly (abdomen).  You keep throwing up or having water poop.  You have a skin rash.  Your symptoms suddenly get worse. This information is not intended to replace advice given to you by your health care provider. Make sure you discuss any questions you have with your health care provider. Document Released: 04/17/2008 Document Revised: 12/15/2015 Document Reviewed: 09/02/2014 [employer] Interactive Patient Education  2017 [employer]  

## 2016-09-26 NOTE — Progress Notes (Signed)
Fred [name]  [phone] [phone]  Clinic Follow up Note   Patient Care Team: [name], MD as PCP - General (Internal Medicine) Truitt Merle, MD as Consulting Physician (Hematology) Kyung Rudd, MD as Consulting Physician (Radiation Oncology) [name], MD as Consulting Physician (Cardiothoracic Surgery) Karie Mainland, RD as Dietitian (Nutrition) Minus Breeding, MD as Consulting Physician (Cardiology)   CHIEF COMPLAINTS:  Fever   Oncology History   Presented with solid dysphagia at least daily with chest pain. Involuntary weight loss of 10-12 lb over past year  Malignant neoplasm of lower third of esophagus (HCC)   Staging form: Esophagus - Adenocarcinoma, AJCC 7th Edition   - Clinical stage from 12/15/2015: Stage IIIA (T3, N1, M0) - Signed by Truitt Merle, MD on 12/24/2015   - Pathologic stage from 04/26/2016: yT0, N0, cM0, GX - Signed by [name], MD on 04/27/2016      Malignant neoplasm of lower third of esophagus ([place])   11/23/2015 Procedure    EGD per [facility](Dr. Toledo):6cm mass in lower third of esophagus      11/24/2015 Initial Diagnosis    Esophageal cancer ([place])      11/24/2015 Pathology Results    Mucinous adenocarcinoma, moderately differentiated-Her2 analysis pending      12/05/2015 Imaging    PET scan showed hypermetabolic a mass in distal esophagus, hypermetabolic activity in the left anterior prostate gland, metabolic density in left adrenal nodule, CT attenuation suggest a benign adrenal adenoma. Left apical 2 mm lung nodule.      12/15/2015 Procedure    EUS showed a uT3N1 lesion from 27cm to 35cm from the incisors       12/26/2015 - 02/02/2016 Chemotherapy    Weekly carboplatin AUC 2 and Taxol 45 mg/m, with concurrent radiation      12/26/2015 - 02/02/2016 Radiation Therapy    Neoadjuvant chemoradiation to the esophageal cancer      04/23/2016 Surgery    total esophagectomy with  cervical esophagogastrostomy, pyloroplasty, feeding jejunostomy       04/23/2016 Pathology Results    Esophagectomy showed no residual tumor, 8 lymph nodes were all negative, incidental leiomyoma 0.2 cm      08/14/2016 PET scan    IMPRESSION: 1. Small hypermetabolic left adrenal mass, this has not changed in size compared to 12/01/15 but has a maximum standard uptake value of 6.2 (previously 4.2). Indeterminate density and MRI characteristics, not specific for adenoma. Early metastatic disease is not readily excluded although the lack of change of size is somewhat reassuring. Surveillance recommended. 2. No other hypermetabolic lesions are identified. 3. Moderate to large left pleural effusion with passive atelectasis. 4. Esophagectomy with gastric pull-through. 5. No hypermetabolic liver lesions are seen. 6. Enlarged prostate gland.      08/16/2016 - 08/23/2016 Hospital Admission    He was diagnosed with DIC, unclear etiology. He was given blood products. He underwent echocardiogram which was negative for endocarditis, CTA chest/abd which was negative for aneurysm or dissection as work up for DIC. He was on prednisone for some time, but stopped as it was not helping much for DIC.       08/28/2016 - 09/01/2016 Hospital Admission    He has had issues with recurrent L sided pleural effusions, DIC and melena. He was last admitted in 1/25, given supportive care with a thoracentesis and blood transfusion. Dr Burr Medico has been following DIC and has requested a direct admit for melena, anemia and thrombocytopenia. GI called  for EGD.        08/31/2016 Pathology Results    Bone Marrow Biopsy, right iliac - METASTATIC ADENOCARCINOMA. - SEE COMMENT. PERIPHERAL BLOOD: - NORMOCYTIC ANEMIA. - THROMBOCYTOPENIA.      08/31/2016 Progression    Bone marrow biopsy showed metastatic adenocarcinoma      09/04/2016 -  Chemotherapy    FOLFOX every 2 weeks       09/19/2016 Procedure    Therapeutic  thoracentesis performed on 09/19/16 yielded 1.5 liters of blood-tinged fluid. Reactive appearing mesothelial cells were present.       HISTORY OF PRESENTING ILLNESS (12/14/2015):  Fred [name] 76 y.o. male is here because of His newly diagnosed esophageal cancer. He is accompanied by his wife and daughter to my clinic today.  He has had dysphagia for 2 months, mainly with solid food, no dysphagia with soft food or liquid. He also has mild pain in mid chest when he swallows. No nausea, abdominal pain, or other discomfort. He has lost about 15 lbs in the last year, no other symoptoms. He was seen by his primary care physician at [facility], and referred to gastroenterologist Dr. Alice Reichert. He underwent EGD on 11/23/2015, which showed a 6 cm mass in the lower third of esophagus. Biopsy showed adenocarcinoma, moderately differentiated. He was referred to [place] to consider neoadjuvant therapy. He is scheduled to see a Psychologist, sport and exercise at [facility] later this week.  He has had some urinary frequency, underwent TURP on 11/02/2015.   CURRENT THERAPY: first line chemo FOLFOX every 2 weeks started on 09/04/2016  INTERIM HISTORY: Mr. Thrall returns for urgent visit due to his fever. Patient is accompanied by wife and was in a wheelchair. Reports fever at around 2 AM this morning. Fever has occurred since yesterday and went up to 100.5 with the highest being 102.4. Tyenol has reduced temperature. Patient is weak and gets "shaky" during the day. Reports soreness with PEG tube in abdomen with slight discharge which patient says is normal. No problems with urine or diarrhea, slight cough with white discharge. Coughing occasionally results in vomiting.    MEDICAL HISTORY:  Past Medical History:  Diagnosis Date  . Esophageal cancer ([place]) 11/23/15   lower 3rd esohagus   . GERD (gastroesophageal reflux disease)   . Hyperlipidemia   . Hypertension     SURGICAL HISTORY: Past Surgical History:  Procedure Laterality Date  .  CHEST TUBE INSERTION Left 04/23/2016   Procedure: CHEST TUBE INSERTION;  Surgeon: [name], MD;  Location: [place];  Service: Thoracic;  Laterality: Left;  . COLONOSCOPY N/A 08/30/2016   Procedure: COLONOSCOPY;  Surgeon: Milus Banister, MD;  Location: [facility];  Service: Endoscopy;  Laterality: N/A;  . COMPLETE ESOPHAGECTOMY N/A 04/23/2016   Procedure: TRANSHIATIAL TOTAL ESOPHAGECTOMY COMPLETE; CERVICAL ESOPHAGOGASTROSTOMY AND PYLOROPLASTY;  Surgeon: [name], MD;  Location: [place];  Service: Thoracic;  Laterality: N/A;  . cystoscope  4/12/1   prostate  . ERCP    . ESOPHAGOGASTRODUODENOSCOPY (EGD) WITH PROPOFOL N/A 08/29/2016   Procedure: ESOPHAGOGASTRODUODENOSCOPY (EGD) WITH PROPOFOL;  Surgeon: Milus Banister, MD;  Location: [facility];  Service: Endoscopy;  Laterality: N/A;  . INGUINAL HERNIA REPAIR    . IR GENERIC HISTORICAL  08/31/2016   IR FLUORO GUIDE PORT INSERTION RIGHT 08/31/2016 Corrie Mckusick, DO [facility]  . IR GENERIC HISTORICAL  08/31/2016   IR US GUIDE VASC ACCESS RIGHT 08/31/2016 Corrie Mckusick, DO [facility]  . JEJUNOSTOMY N/A 04/23/2016   Procedure: FEEDING  JEJUNOSTOMY;  Surgeon: [name], MD;  Location: [place];  Service: Thoracic;  Laterality: N/A;  . LEG SURGERY Left    hole  . PROSTATE BIOPSY  10/05/15  . right shoulder surgery    . VIDEO BRONCHOSCOPY N/A 04/23/2016   Procedure: VIDEO BRONCHOSCOPY;  Surgeon: [name], MD;  [facility];  Service: Thoracic;  Laterality: N/A;    SOCIAL HISTORY: Social History   Social History  . Marital status: Married    Spouse name: N/A  . Number of children: 1  . Years of education: N/A   Occupational History  . Truck Geophysicist/field seismologist    Social History Main Topics  . Smoking status: Former Smoker    Packs/day: 1.00    Years: 10.00    Quit date: 07/24/1967  . Smokeless tobacco: Never Used  . Alcohol use No  . Drug use: No  . Sexual activity: Not Currently   Other Topics Concern  . Not on file    Social History Narrative  . No narrative on file    FAMILY HISTORY: Family History  Problem Relation Age of Onset  . Cancer Maternal Grandfather 60    gastric cancer   . Alzheimer's disease Mother   . Diabetes Mother   . Stroke Father 11  . Multiple sclerosis Sister     ALLERGIES:  is allergic to no known allergies.  MEDICATIONS:  Current Outpatient Prescriptions  Medication Sig Dispense Refill  . acetaminophen (TYLENOL) 325 MG tablet Take 650 mg by mouth every 6 (six) hours as needed.    Marland Kitchen atenolol (TENORMIN) 25 MG tablet Take 1 tab in the morning and half tab in the evening (Patient taking differently: Take 12.5-25 mg by mouth 2 (two) times daily. Take 47m in the morning and 12.53min the evening) 60 tablet 1  . diphenhydrAMINE (BENADRYL) 25 MG tablet Take 25 mg by mouth every 6 (six) hours as needed.    . folic acid (FOLVITE) 1 MG tablet Take 1 tablet (1 mg total) by mouth daily. 30 tablet 0  . lidocaine-prilocaine (EMLA) cream Apply 1 application topically as needed. Apply 1-2 tsp over port site 1 hour prior to chemotherapy 30 g 1  . mirtazapine (REMERON) 15 MG tablet Take 1 tablet (15 mg total) by mouth at bedtime. 30 tablet 2  . Nutritional Supplements (FEEDING SUPPLEMENT, OSMOLITE 1.5 CAL,) LIQD Increase osmolite 1.5 to 6 cans to be infused via continuous pump for 19 hours per day. Flush with 18093mf water before and after continuous feeding.  Give an additional 180m62m water via tube.  Drink an additional 600ml86mwater during the day.  Send formula and bags. 1422 mL 0  . oxycodone (OXY-IR) 5 MG capsule Take 5 mg by mouth every 6 (six) hours as needed for pain.     . proMarland Kitchenhlorperazine (COMPAZINE) 10 MG tablet Take 1 tablet (10 mg total) by mouth every 6 (six) hours as needed for nausea or vomiting. 30 tablet 0  . vitamin B-12 1000 MCG tablet Take 1 tablet (1,000 mcg total) by mouth daily. 30 tablet 0  . Water For Irrigation, Sterile (FREE WATER) SOLN Place 220 mLs into  feeding tube 4 (four) times daily.    . levMarland Kitchenfloxacin (LEVAQUIN) 750 MG tablet Take 1 tablet (750 mg total) by mouth daily. 7 tablet 0  . ondansetron (ZOFRAN ODT) 8 MG disintegrating tablet Take 1 tablet (8 mg total) by mouth every 8 (eight) hours as needed for nausea or  vomiting. (Patient not taking: Reported on 09/14/2016) 30 tablet 2   No current facility-administered medications for this visit.     REVIEW OF SYSTEMS:   Constitutional: Denies chills or abnormal night sweats, (+) very fatigued (+) reports fever Eyes: Denies blurriness of vision, double vision or watery eyes Ears, nose, mouth, throat, and face: Denies mucositis or sore throat (+) sputum production, running nose Respiratory: Denies dyspnea or wheezes (+) SOB, cough with phlegm Cardiovascular: Denies palpitation, chest discomfort or lower extremity swelling Gastrointestinal:  Denies heartburn, diarrhea and hematochezia (+) nausea/vomiting Skin: Denies abnormal skin rashes Lymphatics: Denies new lymphadenopathy or easy bruising Neurological:Denies numbness, tingling or new weaknesses Behavioral/Psych: Mood is stable, no new changes  All other systems were reviewed with the patient and are negative.  PHYSICAL EXAMINATION: ECOG PERFORMANCE STATUS: 3  BP 122/62 (BP Location: Left Arm, Patient Position: Sitting)   Pulse (!) 114   Temp 100 F (37.8 C) (Oral)   Resp 18   Ht 5' 5"  (1.651 m)   Wt 133 lb 1.6 oz (60.4 kg)   SpO2 99%   BMI 22.15 kg/m    GENERAL:alert, no distress and comfortable, in wheelchair SKIN: Appears to be pale, skin texture, turgor are normal, no rashes or significant lesions EYES: normal, conjunctiva are pink and non-injected, sclera clear OROPHARYNX:no exudate, no erythema and lips, buccal mucosa, and tongue normal  NECK: supple, thyroid normal size, non-tender, without nodularity LYMPH:  no palpable lymphadenopathy in the cervical, axillary or inguinal VASCULAR: (+) PAC with no discharge  LUNGS:  clear to auscultation and percussion with normal breathing effort except decreased breath sound at the left lung base HEART: regular rate & rhythm and no murmurs and no lower extremity edema ABDOMEN:abdomen soft, non-tender and normal bowel sounds, J tube in place. (+)discharge from PEG tube but no sign of infection Musculoskeletal:no cyanosis of digits and no clubbing  PSYCH: alert & oriented x 3 with fluent speech NEURO: no focal motor/sensory deficits  LABORATORY DATA:  I have reviewed the data as listed CBC Latest Ref Rng & Units 09/26/2016 09/18/2016 09/14/2016  WBC 4.0 - 10.3 10e3/uL 5.6 5.9 6.1  Hemoglobin 13.0 - 17.1 g/dL 10.7(L) 10.1(L) 9.6(L)  Hematocrit 38.4 - 49.9 % 31.7(L) 31.1(L) 29.2(L)  Platelets 140 - 400 10e3/uL 232 226 211   CMP Latest Ref Rng & Units 09/26/2016 09/18/2016 09/04/2016  Glucose 70 - 140 mg/dl 138 123 115  BUN 7.0 - 26.0 mg/dL 13.1 14.0 19.0  Creatinine 0.7 - 1.3 mg/dL 0.6(L) 0.7 0.6(L)  Sodium 136 - 145 mEq/L 134(L) 138 136  Potassium 3.5 - 5.1 mEq/L 4.5 4.7 4.8  Chloride 101 - 111 mmol/L - - -  CO2 22 - 29 mEq/L 27 24 26   Calcium 8.4 - 10.4 mg/dL 9.2 9.4 9.0  Total Protein 6.4 - 8.3 g/dL 6.4 6.7 6.2(L)  Total Bilirubin 0.20 - 1.20 mg/dL 0.60 0.55 1.79(H)  Alkaline Phos 40 - 150 U/L 404(H) 617(H) 452(H)  AST 5 - 34 U/L 33 30 60(H)  ALT 0 - 55 U/L 21 17 21    Results for JATERRIUS, RICKETSON (MRN [id number]) as of 09/13/2016 09:50  Ref. Range 12/14/2015 14:55 12/14/2015 16:52 01/23/2016 08:51 08/09/2016 13:04 09/07/2016 10:56  CEA Latest Ref Range: 0.0 - 4.7 ng/mL 28.5 (H)   2,382.0 (H) 4,056.0 (H)  CEA ([facility]) Latest Ref Range: 0.00 - 5.00 ng/mL    1,731.10 x 2 dilu... (H) 2,612.34 Result C... (H)    Pathology report  Cytology 09/19/16 Diagnosis  PLEURAL FLUID, LEFT (SPECIMEN 1 OF 1 COLLECTED 09/19/16): REACTIVE APPEARING MESOTHELIAL CELLS PRESENT.  Diagnosis 08/31/2016 Bone Marrow Biopsy, right iliac - METASTATIC ADENOCARCINOMA. - SEE COMMENT. PERIPHERAL  BLOOD: - NORMOCYTIC ANEMIA. - THROMBOCYTOPENIA.  Diagnosis 08/02/16 PLEURAL FLUID, LEFT (SPECIMEN 1 OF 1 COLLECTED 08/02/16): REACTIVE MESOTHELIAL CELLS PRESENT. LYMPHOCYTES PRESENT.  Diagnosis 11/23/2015 Mass, lower third of the esophagus, mucosal biopsy Mucinous adenocarcinoma, moderately differentiated.  HER 2 (-)   Diagnosis 04/23/2016 1. Lymph node, biopsy, Cervical - ONE OF ONE LYMPH NODES NEGATIVE FOR CARCINOMA (0/1). 2. Lymph node, biopsy, Periesophageal - BLOOD WITH SCANT BENIGN SOFT TISSUE AND SKELETAL MUSCLE. - NO MALIGNANCY IDENTIFIED. 3. Esophagogastrectomy - ULCERATION WITH INFLAMMATION AND REACTIVE CHANGES. - FOCAL INTESTINAL METAPLASIA. - CHANGES CONSISTENT WITH TREATMENT EFFECT. - SEVEN OF SEVEN LYMPH NODES NEGATIVE FOR CARCINOMA (0/7). - INCIDENTAL LEIOMYOMA, 0.2 CM. - MARGINS ARE NEGATIVE FOR DYSPLASIA OR MALIGNANCY. - SEE ONCOLOGY TABLE.   RADIOGRAPHIC STUDIES: I have personally reviewed the radiological images as listed and agreed with the findings in the report. Dg Chest 1 View  Result Date: 09/19/2016 CLINICAL DATA:  Status post left thoracentesis today. EXAM: CHEST 1 VIEW COMPARISON:  PA and lateral chest 09/11/2016. FINDINGS: Left pleural effusion is markedly decreased after thoracentesis. No pneumothorax. Right lung is expanded and clear. Postoperative change of esophagectomy and gastric pull-through noted. Heart size is normal. Port-A-Cath is in place. Jejunostomy tube is partially visualized. IMPRESSION: Marked decrease in left pleural effusion after thoracentesis. Negative for pneumothorax. No new abnormality. Electronically Signed   By: Inge Rise M.D.   On: 09/19/2016 11:02   Dg Chest 2 View  Result Date: 09/11/2016 CLINICAL DATA:  Malignant neoplasm of lower third of esophagus. F/u left pleural effusion. Ex-smoker. Sob for 2 days. Hx htn. EXAM: CHEST  2 VIEW COMPARISON:  08/28/2016 FINDINGS: Moderate left pleural effusion obscures the left  hemidiaphragm and portions of the left heart border, without significant change from the recent prior study. No right pleural effusion. There is some dependent opacity at the left lung base adjacent to the pleural effusion consistent with atelectasis. Lungs are otherwise clear. No pneumothorax. Cardiac silhouette is normal in size. No mediastinal or hilar masses. No convincing adenopathy. There is a new right anterior chest wall Port-A-Cath extending through the right internal jugular vein to have its tip in the lower superior vena cava. Mild to moderate compression deformity noted of what appears to be L1. This is stable from a lateral chest radiograph dated 07/26/2016. IMPRESSION: 1. Persistent moderate left pleural effusion with associated dependent compressive atelectasis. 2. No evidence of pneumonia. No pulmonary edema. Lungs are otherwise clear. 3. Right anterior chest wall Port-A-Cath is well positioned. No other change from most recent prior study. Electronically Signed   By: Lajean Manes M.D.   On: 09/11/2016 14:29   Dg Hand 2 View Right  Result Date: 08/31/2016 CLINICAL DATA:  Third MCP pain. EXAM: RIGHT HAND - 2 VIEW COMPARISON:  None. FINDINGS: There is no evidence of fracture or dislocation. There is no evidence of arthropathy or other focal bone abnormality. Soft tissues are unremarkable. IMPRESSION: No cause for the patient's third MCP pain identified. Scattered degenerative changes. Electronically Signed   By: Dorise Bullion III M.D   On: 08/31/2016 18:22   Ir US Guide Vasc Access Right  Result Date: 08/31/2016 INDICATION: 76 year old male with anemia. He has been referred for port catheter placement. EXAM: IMPLANTED PORT A CATH PLACEMENT WITH ULTRASOUND AND FLUOROSCOPIC GUIDANCE MEDICATIONS: 2 g Ancef; The  antibiotic was administered within an appropriate time interval prior to skin puncture. ANESTHESIA/SEDATION: Moderate (conscious) sedation was employed during this procedure. A total of  Versed 2.0 mg and Fentanyl 100 mcg was administered intravenously. Moderate Sedation Time: 25 minutes. The patient's level of consciousness and vital signs were monitored continuously by radiology nursing throughout the procedure under my direct supervision. FLUOROSCOPY TIME:  Zero minutes, 6 seconds COMPLICATIONS: None PROCEDURE: The procedure, risks, benefits, and alternatives were explained to the patient. Questions regarding the procedure were encouraged and answered. The patient understands and consents to the procedure. Ultrasound survey was performed with images stored and sent to PACs. The right neck and chest was prepped with chlorhexidine, and draped in the usual sterile fashion using maximum barrier technique (cap and mask, sterile gown, sterile gloves, large sterile sheet, hand hygiene and cutaneous antiseptic). Antibiotic prophylaxis was provided with 2.0g Ancef administered IV one hour prior to skin incision. Local anesthesia was attained by infiltration with 1% lidocaine without epinephrine. Ultrasound demonstrated patency of the right internal jugular vein, and this was documented with an image. Under real-time ultrasound guidance, this vein was accessed with a 21 gauge micropuncture needle and image documentation was performed. A small dermatotomy was made at the access site with an 11 scalpel. A 0.018" wire was advanced into the SVC and used to estimate the length of the internal catheter. The access needle exchanged for a 40F micropuncture vascular sheath. The 0.018" wire was then removed and a 0.035" wire advanced into the IVC. An appropriate location for the subcutaneous reservoir was selected below the clavicle and an incision was made through the skin and underlying soft tissues. The subcutaneous tissues were then dissected using a combination of blunt and sharp surgical technique and a pocket was formed. Superficial artery was encountered, requiring suture ligation. A single lumen power  injectable portacatheter was then tunneled through the subcutaneous tissues from the pocket to the dermatotomy and the port reservoir placed within the subcutaneous pocket. The venous access site was then serially dilated and a peel away vascular sheath placed over the wire. The wire was removed and the port catheter advanced into position under fluoroscopic guidance. The catheter tip is positioned in the cavoatrial junction. This was documented with a spot image. The portacatheter was then tested and found to flush and aspirate well. The port was flushed with saline followed by 100 units/mL heparinized saline. The pocket was then closed in two layers using first subdermal inverted interrupted absorbable sutures followed by a running subcuticular suture. The epidermis was then sealed with Dermabond. The dermatotomy at the venous access site was also seal with Dermabond. Patient tolerated the procedure well and remained hemodynamically stable throughout. No complications encountered and no significant blood loss encountered IMPRESSION: Status post right IJ port catheter placement. Catheter ready for use. Signed, Dulcy Fanny. Earleen Newport, DO [facility] Electronically Signed   By: Corrie Mckusick D.O.   On: 08/31/2016 17:10   Ct Biopsy  Result Date: 08/31/2016 INDICATION: History of esophageal cancer and possible DIC. Concern for metastatic disease to the bone marrow versus primary bone marrow dysfunction. EXAM: CT GUIDED BONE MARROW ASPIRATION AND CORE BIOPSY Interventional Radiologist:  Criselda Peaches, MD MEDICATIONS: None. ANESTHESIA/SEDATION: Moderate (conscious) sedation was employed during this procedure. A total of 2 milligrams versed and 50 micrograms fentanyl were administered intravenously. The patient's level of consciousness and vital signs were monitored continuously by radiology nursing throughout the procedure under my direct supervision. Total  monitored sedation time: 10 minutes FLUOROSCOPY TIME:  Fluoroscopy Time: 0 minutes 0 seconds (0 mGy). COMPLICATIONS: None immediate. Estimated blood loss: <25 mL PROCEDURE: Informed written consent was obtained from the patient after a thorough discussion of the procedural risks, benefits and alternatives. All questions were addressed. Maximal Sterile Barrier Technique was utilized including caps, mask, sterile gowns, sterile gloves, sterile drape, hand hygiene and skin antiseptic. A timeout was performed prior to the initiation of the procedure. The patient was positioned prone and non-contrast localization CT was performed of the pelvis to demonstrate the iliac marrow spaces. Maximal barrier sterile technique utilized including caps, mask, sterile gowns, sterile gloves, large sterile drape, hand hygiene, and betadine prep. Under sterile conditions and local anesthesia, an 11 gauge coaxial bone biopsy needle was advanced into the right iliac marrow space. Needle position was confirmed with CT imaging. Initially, bone marrow aspiration was performed. Next, the 11 gauge outer cannula was utilized to obtain a right iliac bone marrow core biopsy. Needle was removed. Hemostasis was obtained with compression. The patient tolerated the procedure well. Samples were prepared with the cytotechnologist. IMPRESSION: Technically successful right-sided bone marrow biopsy. Electronically Signed   By: Jacqulynn Cadet M.D.   On: 08/31/2016 17:43   Dg Chest Port 1 View  Result Date: 08/28/2016 CLINICAL DATA:  Pleural effusions. Previous smoker. History of esophageal cancer and hypertension. EXAM: PORTABLE CHEST 1 VIEW COMPARISON:  08/22/2016 FINDINGS: Small left pleural effusion is increasing in size since previous study. Atelectasis in the left lung base. Right lung is clear. Emphysematous changes bilaterally. Heart size and pulmonary vascularity are normal. Degenerative changes in the spine and shoulders. IMPRESSION: Small  but increasing left pleural effusion and basilar atelectasis. Emphysematous changes in the lungs. Electronically Signed   By: Lucienne Capers M.D.   On: 08/28/2016 22:50   Ct Bone Marrow Biopsy & Aspiration  Result Date: 09/07/2016 INDICATION: History of esophageal cancer and possible DIC. Concern for metastatic disease to the bone marrow versus primary bone marrow dysfunction. EXAM: CT GUIDED BONE MARROW ASPIRATION AND CORE BIOPSY Interventional Radiologist:  Criselda Peaches, MD MEDICATIONS: None. ANESTHESIA/SEDATION: Moderate (conscious) sedation was employed during this procedure. A total of 2 milligrams versed and 50 micrograms fentanyl were administered intravenously. The patient's level of consciousness and vital signs were monitored continuously by radiology nursing throughout the procedure under my direct supervision. Total monitored sedation time: 10 minutes FLUOROSCOPY TIME:  Fluoroscopy Time: 0 minutes 0 seconds (0 mGy). COMPLICATIONS: None immediate. Estimated blood loss: <25 mL PROCEDURE: Informed written consent was obtained from the patient after a thorough discussion of the procedural risks, benefits and alternatives. All questions were addressed. Maximal Sterile Barrier Technique was utilized including caps, mask, sterile gowns, sterile gloves, sterile drape, hand hygiene and skin antiseptic. A timeout was performed prior to the initiation of the procedure. The patient was positioned prone and non-contrast localization CT was performed of the pelvis to demonstrate the iliac marrow spaces. Maximal barrier sterile technique utilized including caps, mask, sterile gowns, sterile gloves, large sterile drape, hand hygiene, and betadine prep. Under sterile conditions and local anesthesia, an 11 gauge coaxial bone biopsy needle was advanced into the right iliac marrow space. Needle position was confirmed with CT imaging. Initially, bone marrow aspiration was performed. Next, the 11 gauge outer  cannula was utilized to obtain a right iliac bone marrow core biopsy. Needle was removed. Hemostasis was obtained with compression. The patient tolerated the procedure well. Samples were prepared with the cytotechnologist. IMPRESSION: Technically  successful right-sided bone marrow biopsy. Electronically Signed   By: Jacqulynn Cadet M.D.   On: 08/31/2016 17:43   Ir Fluoro Guide Port Insertion Right  Result Date: 08/31/2016 INDICATION: 76 year old male with anemia. He has been referred for port catheter placement. EXAM: IMPLANTED PORT A CATH PLACEMENT WITH ULTRASOUND AND FLUOROSCOPIC GUIDANCE MEDICATIONS: 2 g Ancef; The antibiotic was administered within an appropriate time interval prior to skin puncture. ANESTHESIA/SEDATION: Moderate (conscious) sedation was employed during this procedure. A total of Versed 2.0 mg and Fentanyl 100 mcg was administered intravenously. Moderate Sedation Time: 25 minutes. The patient's level of consciousness and vital signs were monitored continuously by radiology nursing throughout the procedure under my direct supervision. FLUOROSCOPY TIME:  Zero minutes, 6 seconds COMPLICATIONS: None PROCEDURE: The procedure, risks, benefits, and alternatives were explained to the patient. Questions regarding the procedure were encouraged and answered. The patient understands and consents to the procedure. Ultrasound survey was performed with images stored and sent to PACs. The right neck and chest was prepped with chlorhexidine, and draped in the usual sterile fashion using maximum barrier technique (cap and mask, sterile gown, sterile gloves, large sterile sheet, hand hygiene and cutaneous antiseptic). Antibiotic prophylaxis was provided with 2.0g Ancef administered IV one hour prior to skin incision. Local anesthesia was attained by infiltration with 1% lidocaine without epinephrine. Ultrasound demonstrated patency of the right internal jugular vein, and this was documented with an image.  Under real-time ultrasound guidance, this vein was accessed with a 21 gauge micropuncture needle and image documentation was performed. A small dermatotomy was made at the access site with an 11 scalpel. A 0.018" wire was advanced into the SVC and used to estimate the length of the internal catheter. The access needle exchanged for a 48F micropuncture vascular sheath. The 0.018" wire was then removed and a 0.035" wire advanced into the IVC. An appropriate location for the subcutaneous reservoir was selected below the clavicle and an incision was made through the skin and underlying soft tissues. The subcutaneous tissues were then dissected using a combination of blunt and sharp surgical technique and a pocket was formed. Superficial artery was encountered, requiring suture ligation. A single lumen power injectable portacatheter was then tunneled through the subcutaneous tissues from the pocket to the dermatotomy and the port reservoir placed within the subcutaneous pocket. The venous access site was then serially dilated and a peel away vascular sheath placed over the wire. The wire was removed and the port catheter advanced into position under fluoroscopic guidance. The catheter tip is positioned in the cavoatrial junction. This was documented with a spot image. The portacatheter was then tested and found to flush and aspirate well. The port was flushed with saline followed by 100 units/mL heparinized saline. The pocket was then closed in two layers using first subdermal inverted interrupted absorbable sutures followed by a running subcuticular suture. The epidermis was then sealed with Dermabond. The dermatotomy at the venous access site was also seal with Dermabond. Patient tolerated the procedure well and remained hemodynamically stable throughout. No complications encountered and no significant blood loss encountered IMPRESSION: Status post right IJ port catheter placement. Catheter ready for use. Signed, Dulcy Fanny. Earleen Newport, DO [facility] Electronically Signed   By: Corrie Mckusick D.O.   On: 08/31/2016 17:10   US Abdomen Limited Ruq  Result Date: 08/30/2016 CLINICAL DATA:  Elevated liver function studies. History of esophageal malignancy, hyperlipidemia, former smoker. EXAM: US ABDOMEN LIMITED - RIGHT UPPER  QUADRANT COMPARISON:  Ultrasound of August 16, 2016 FINDINGS: Gallbladder: No gallstones or wall thickening visualized. No sonographic Murphy sign noted by sonographer. Common bile duct: Diameter: 5.4 mm Liver: The hepatic echotexture is normal. In the right hepatic lobe posteriorly there is a hyperechoic focus measuring 1.4 x 1.0 x 1.0 cm most compatible with a hemangioma. This is similar in appearance to a hypoechoic liver focus on the March 20, 2016 abdominal CT scan. 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 No acute abnormality within the liver is observed. Normal appearance of the liver and common bile duct. Electronically Signed   By: David  Martinique M.D.   On: 08/30/2016 08:33   US Thoracentesis Asp Pleural Space W/img Guide  Result Date: 09/19/2016 INDICATION: Patient with history of metastatic esophageal cancer, dyspnea, recurrent left pleural effusion. Request made for diagnostic and therapeutic left thoracentesis. EXAM: ULTRASOUND GUIDED DIAGNOSTIC AND THERAPEUTIC LEFT THORACENTESIS MEDICATIONS: None. COMPLICATIONS: None immediate. PROCEDURE: An ultrasound guided thoracentesis was thoroughly discussed with the patient and questions answered. The benefits, risks, alternatives and complications were also discussed. The patient understands and wishes to proceed with the procedure. Written consent was obtained. Ultrasound was performed to localize and mark an adequate pocket of fluid in the left chest. The area was then prepped and draped in the normal sterile fashion. 1% Lidocaine was used for local anesthesia. Under ultrasound guidance  a Safe-T-Centesis catheter was introduced. Thoracentesis was performed. The catheter was removed and a dressing applied. FINDINGS: A total of approximately 1.5 liters of blood-tinged fluid was removed. Samples were sent to the laboratory as requested by the clinical team. The Oncology service was notified of the above findings. IMPRESSION: Successful ultrasound guided diagnostic and therapeutic left thoracentesis yielding 1.5 liters of pleural fluid. Read by: Rowe Robert, PA-C Electronically Signed   By: Lucrezia Europe M.D.   On: 09/19/2016 12:37   PET 08/14/2016 IMPRESSION: 1. Small hypermetabolic left adrenal mass, this has not changed in size compared to 12/01/15 but has a maximum standard uptake value of 6.2 (previously 4.2). Indeterminate density and MRI characteristics, not specific for adenoma. Early metastatic disease is not readily excluded although the lack of change of size is somewhat reassuring. Surveillance recommended. 2. No other hypermetabolic lesions are identified. 3. Moderate to large left pleural effusion with passive atelectasis. 4. Esophagectomy with gastric pull-through. 5. No hypermetabolic liver lesions are seen. 6. Enlarged prostate gland.  CT angio chest/abdomen/pelvis for dissection w/wo contrast 08/22/2016 IMPRESSION: No evidence of aortic aneurysm or dissection. No evidence of pulmonary embolus. Heart is borderline in size. Stable small left adrenal nodule which was hypermetabolic on prior PET CT. Stable large left pleural effusion with compressive atelectasis in the left lower lobe. Stable appearance of the gastric pull-through post esophagectomy.  ASSESSMENT & PLAN:  76 y.o. male presented with dysphagia with solid food  1. Fever -He has had intermittent fever for today -Lab reviewed, ANC normal, physical exam was unremarkable, no obvious clinical signs of pneumonia or UTI. No signs of infection at  his port site and J-tube site. -I'll obtain blood culture  from his port today, check UA and urine culture -Since he had chemotherapy last week, I'll give him a prescription of Levaquin 750 mg once daily for 7 days while I'm waiting for the urine and blood cultures.  2. Distal esophageal adenocarcinoma, [id number], stage IIIA, [id number], diffuse bone metastasis 08/2016 -I previously reviewed his CT scan, PET scan, EGD, and the biopsy findings with patient and his family member in details. -  The initial PET scan showed no definitive distant metastasis. 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 This will be followed in the future scan. -By EUS, he had T3 N1 stage III disease. -He completed neoadjuvant radiation with weekly Carbo and Taxol. -I previously reviewed his surgical pathology findings, he has had complete pathologic response to neoadjuvant chemotherapy and radiation, which predicts good prognosis -Unfortunately he developed diffuse bone metastasis in January 2018, confirmed by bone marrow biopsy -He has started first-line chemotherapy FOLFOX, tolerating well, and his anemia and some cytopenia has improved since chemotherapy -The goal of therapy is palliative -I have previously requested HER2 test and Foundation one on his initial esophageal mass biopsy from outside lab to see if he would benefit from Herceptin and immunotherapy -He is clinically doing better, still quite fatigued, will continue chemotherapy every 2 weeks -We previously discussed his clinical response May be difficult to evaluate since his PET scan was negative when he had a bone metastasis, we'll based on the blood counts, and CEA level, also repeat PET scan every 3 months to evaluate his response.  -if his fever does not resolve, will postpone his next week chemotherapy   Plan - Blood and urine culture will be done today  -500 normal Saline will be given today - I prescribed Levoquin today. Patient knows to take once daily  for 7 days -will call him in 2 days to see if his fever resolves, will follow his culture results  - Follow up with chemo next week if fever resolves   All questions were answered. The patient knows to call the clinic with any problems, questions or concerns.  I spent 25 minutes counseling the patient face to face. The total time spent in the appointment was 30 minutes and more than 50% was on counseling.  This document serves as a record of services personally performed by Truitt Merle, MD. It was created on her behalf by Maryla Morrow, a trained medical scribe. The creation of this record is based on the scribe's personal observations and the provider's statements to them. This document has been checked and approved by the attending provider.  I have reviewed the above documentation for accuracy and completeness and I agree with the above.   Truitt Merle, MD 09/26/2016

## 2016-09-27 ENCOUNTER — Ambulatory Visit (INDEPENDENT_AMBULATORY_CARE_PROVIDER_SITE_OTHER): Payer: Non-veteran care | Admitting: Cardiothoracic Surgery

## 2016-09-27 ENCOUNTER — Other Ambulatory Visit: Payer: Self-pay | Admitting: *Deleted

## 2016-09-27 ENCOUNTER — Encounter: Payer: Self-pay | Admitting: Cardiothoracic Surgery

## 2016-09-27 VITALS — BP 105/63 | HR 108 | Temp 97.2°F | Resp 16 | Ht 65.0 in | Wt 132.6 lb

## 2016-09-27 DIAGNOSIS — C159 Malignant neoplasm of esophagus, unspecified: Secondary | ICD-10-CM | POA: Diagnosis not present

## 2016-09-27 DIAGNOSIS — C155 Malignant neoplasm of lower third of esophagus: Secondary | ICD-10-CM

## 2016-09-27 DIAGNOSIS — R110 Nausea: Secondary | ICD-10-CM

## 2016-09-27 DIAGNOSIS — Z09 Encounter for follow-up examination after completed treatment for conditions other than malignant neoplasm: Secondary | ICD-10-CM

## 2016-09-27 LAB — URINE CULTURE: Organism ID, Bacteria: NO GROWTH

## 2016-09-27 MED ORDER — PROCHLORPERAZINE MALEATE 10 MG PO TABS: 10.0000 mg | ORAL_TABLET | Freq: Four times a day (QID) | ORAL | 3 refills | Status: AC | PRN

## 2016-09-27 NOTE — Progress Notes (Signed)
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 SURGEON:  Lanelle Bal, M.D.  Cancer Staging Malignant neoplasm of lower third of esophagus [facility]) Staging form: Esophagus - Adenocarcinoma, AJCC 7th Edition - Clinical stage from 12/15/2015: Stage IIIA (T3, N1, M0) - Signed by Truitt Merle, MD on 12/24/2015 - Pathologic stage from 04/26/2016: yT0, N0, cM0, GX - Signed by [name], MD on 04/27/2016    History of Present Illness:     Patient returns to the office today,  Since last seen he did develop increasing fatigue and severe anemia. Evaluation included upper GI endoscopy and colonoscopy without evidence of source of bleeding or recurrent tumor. Bone marrow biopsy was done confirming metastatic adenocarcinoma. The patient has had improve marrow function after the first cycle of chemotherapy. He still remains easily fatigued. For the past several days sees had episodic fever, has seen oncology and was started on by mouth antibiotics.    Diagnosis PLEURAL FLUID, LEFT (SPECIMEN 1 OF 1 COLLECTED 07/04/16): REACTIVE APPEARING MESOTHELIAL CELLS. Enid Cutter MD Pathologist, Electronic Signature     Past Medical History:  Diagnosis Date  . Esophageal cancer ([place]) 11/23/15   lower 3rd esohagus    . GERD (gastroesophageal reflux disease)   . Hyperlipidemia   . Hypertension      History  Smoking Status  . Former Smoker  . Packs/day: 1.00  . Years: 10.00  . Quit date: 07/24/1967  Smokeless Tobacco  . Never Used    History  Alcohol Use No     Allergies  Allergen Reactions  . No Known Allergies     Current Outpatient Prescriptions  Medication Sig Dispense Refill  . atenolol (TENORMIN) 25 MG tablet Take 1 tab in the morning and half tab in the evening (Patient taking differently: Take 12.5-25 mg by mouth 2 (two) times daily. Take 65m in the morning and 12.555min the evening) 60 tablet 1  . diphenhydrAMINE (BENADRYL) 25 MG tablet Take 25 mg by mouth every 6 (six) hours as needed.    . folic acid (FOLVITE) 1 MG tablet Take 1 tablet (1 mg total) by mouth daily. 30 tablet 0  . levofloxacin (LEVAQUIN) 750 MG tablet Take 1 tablet (750 mg total) by mouth daily. 7 tablet 0  . lidocaine-prilocaine (EMLA) cream Apply 1 application topically as needed. Apply 1-2 tsp over port site 1 hour prior to chemotherapy 30 g 1  . mirtazapine (REMERON) 15 MG tablet Take 1 tablet (15 mg total) by mouth at bedtime. 30 tablet 2  . Nutritional Supplements (FEEDING SUPPLEMENT, OSMOLITE 1.5 CAL,) LIQD Increase osmolite 1.5 to 6 cans to be infused via continuous pump for 19 hours per day. Flush with 1804mf water before and after continuous feeding.  Give an additional 180m5m water via tube.  Drink an additional 600ml78mwater during the  day.  Send formula and bags. 1422 mL 0  . ondansetron (ZOFRAN ODT) 8 MG disintegrating tablet Take 1 tablet (8 mg total) by mouth every 8 (eight) hours as needed for nausea or vomiting. 30 tablet 2  . oxycodone (OXY-IR) 5 MG capsule Take 5 mg by mouth every 6 (six) hours as needed for pain.     Marland Kitchen prochlorperazine (COMPAZINE) 10 MG tablet Take 1 tablet (10 mg total) by mouth every 6 (six) hours as needed for nausea or vomiting. 30 tablet 0  . vitamin B-12 1000 MCG tablet  Take 1 tablet (1,000 mcg total) by mouth daily. 30 tablet 0  . Water For Irrigation, Sterile (FREE WATER) SOLN Place 220 mLs into feeding tube 4 (four) times daily.    Marland Kitchen acetaminophen (TYLENOL) 325 MG tablet Take 650 mg by mouth every 6 (six) hours as needed.     No current facility-administered medications for this visit.        Physical Exam: BP 105/63 (BP Location: Right Arm, Patient Position: Sitting, Cuff Size: Normal)   Pulse (!) 108   Temp 97.2 F (36.2 C) (Oral)   Resp 16   Ht 5' 5"  (1.651 m)   Wt 132 lb 9.6 oz (60.1 kg)   SpO2 98% Comment: RA  BMI 22.07 kg/m   General appearance: alert and cooperative Neurologic: intact Heart: regular rate and rhythm, S1, S2 normal, no murmur, click, rub or gallop Lungs: diminished breath sounds LLL Abdomen: soft, non-tender; bowel sounds normal; no masses,  no organomegaly Extremities: extremities normal, atraumatic, no cyanosis or edema and Homans sign is negative, no sign of DVT Wound: Neck and abdominal incision are well-healed, feeding tube is intact. There is no cervical or supraclavicular adenopathy No obvious infection around the patient's jejunostomy tube  Diagnostic Studies & Laboratory data:     Recent Radiology Findings:  No results found.      Recent Lab Findings: Lab Results  Component Value Date   WBC 5.6 09/26/2016   HGB 10.7 (L) 09/26/2016   HCT 31.7 (L) 09/26/2016   PLT 232 09/26/2016   GLUCOSE 138 09/26/2016   ALT 21 09/26/2016   AST 33 09/26/2016   NA 134 (L) 09/26/2016   K 4.5 09/26/2016   CL 108 08/31/2016   CREATININE 0.6 (L) 09/26/2016   BUN 13.1 09/26/2016   CO2 27 09/26/2016   TSH 2.422 05/03/2016   INR 1.42 08/28/2016   Wt Readings from Last 3 Encounters:  09/27/16 132 lb 9.6 oz (60.1 kg)  09/26/16 133 lb 1.6 oz (60.4 kg)  09/14/16 132 lb 8 oz (60.1 kg)      Assessment / Plan:    Patient now stable after esophageal resection but found to have 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 Initial treatment by oncology has been positive with improved marrow function.  Patient has had intermittent fever currently on medical oncology   Antibiotics.  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 SURGEON:  Lanelle Bal, M.D.  Cancer Staging Malignant neoplasm of lower third of esophagus [facility]) Staging form: Esophagus - Adenocarcinoma, AJCC 7th Edition - Clinical stage from 12/15/2015: Stage IIIA (T3, N1, M0) - Signed by Truitt Merle, MD on 12/24/2015 - Pathologic stage from 04/26/2016: yT0, N0, cM0, GX - Signed by [name], MD on 04/27/2016    History of Present Illness:     Patient returns to the office today,In February he had developed significant anemia requiring transfusions upper GI endoscopy and colonoscopy did not reveal any source of bleeding or recurrent tumor, bone marrow biopsy revealed metastatic adenocarcinoma. He's been started on chemotherapy and has had bone marrow function improved after the first cycle of chemotherapy. He continues to the week and  fatigues easily,     Diagnosis Bone Marrow Biopsy, right iliac - METASTATIC ADENOCARCINOMA. - SEE COMMENT. PERIPHERAL BLOOD: - NORMOCYTIC ANEMIA. - THROMBOCYTOPENIA.  PLEURAL FLUID, LEFT (SPECIMEN 1 OF 1 COLLECTED 07/04/16): REACTIVE APPEARING MESOTHELIAL CELLS. Enid Cutter MD Pathologist, Electronic Signature     Past Medical History:  Diagnosis Date  . Esophageal cancer ([place]) 11/23/15   lower 3rd esohagus   . GERD (gastroesophageal reflux disease)   . Hyperlipidemia   . Hypertension      History  Smoking Status  . Former Smoker  . Packs/day: 1.00  . Years: 10.00  . Quit date: 07/24/1967  Smokeless Tobacco  . Never Used    History  Alcohol Use No     Allergies  Allergen Reactions  . No Known Allergies     Current Outpatient Prescriptions  Medication Sig Dispense Refill  . atenolol (TENORMIN) 25 MG tablet Take 1 tab in the morning and half tab in the evening (Patient taking differently: Take 12.5-25 mg by mouth 2 (two) times daily. Take 8m in the morning and 12.537min the evening) 60 tablet 1  . diphenhydrAMINE (BENADRYL) 25 MG tablet Take 25 mg by mouth every 6 (six) hours as needed.    . folic acid (FOLVITE) 1 MG tablet Take 1 tablet (1 mg total) by mouth daily. 30 tablet 0  . levofloxacin (LEVAQUIN) 750 MG tablet Take 1 tablet (750 mg total) by mouth daily. 7 tablet 0  . lidocaine-prilocaine (EMLA) cream Apply 1 application topically as needed. Apply 1-2 tsp over port site 1 hour prior to chemotherapy 30 g 1  . mirtazapine (REMERON) 15 MG tablet Take 1 tablet (15 mg total) by mouth at bedtime. 30 tablet 2  . Nutritional Supplements (FEEDING SUPPLEMENT, OSMOLITE 1.5 CAL,) LIQD Increase osmolite 1.5 to 6 cans to be infused via continuous pump for 19 hours per day. Flush with 18022mf water before and after continuous feeding.  Give an additional 180m6m water via tube.  Drink an additional 600ml51mwater during the day.  Send formula and bags. 1422 mL 0  .  ondansetron (ZOFRAN ODT) 8 MG disintegrating tablet Take 1 tablet (8 mg total) by mouth every 8 (eight) hours as needed for nausea or vomiting. 30 tablet 2  . oxycodone (OXY-IR) 5 MG capsule Take 5 mg by mouth every 6 (six)  hours as needed for pain.     Marland Kitchen prochlorperazine (COMPAZINE) 10 MG tablet Take 1 tablet (10 mg total) by mouth every 6 (six) hours as needed for nausea or vomiting. 30 tablet 0  . vitamin B-12 1000 MCG tablet Take 1 tablet (1,000 mcg total) by mouth daily. 30 tablet 0  . Water For Irrigation, Sterile (FREE WATER) SOLN Place 220 mLs into feeding tube 4 (four) times daily.    Marland Kitchen acetaminophen (TYLENOL) 325 MG tablet Take 650 mg by mouth every 6 (six) hours as needed.     No current facility-administered medications for this visit.        Physical Exam: BP 105/63 (BP Location: Right Arm, Patient Position: Sitting, Cuff Size: Normal)   Pulse (!) 108   Temp 97.2 F (36.2 C) (Oral)   Resp 16   Ht 5' 5"  (1.651 m)   Wt 132 lb 9.6 oz (60.1 kg)   SpO2 98% Comment: RA  BMI 22.07 kg/m   General appearance: alert and cooperative Neurologic: intact Heart: regular rate and rhythm, S1, S2 normal, no murmur, click, rub or gallop Lungs: diminished breath sounds LLL Abdomen: soft, non-tender; bowel sounds normal; no masses,  no organomegaly Extremities: extremities normal, atraumatic, no cyanosis or edema and Homans sign is negative, no sign of DVT Wound: Neck and abdominal incision are well-healed, feeding tube is intact. There is no cervical or supraclavicular adenopathy   Diagnostic Studies & Laboratory data:     Recent Radiology Findings:  No results found.  I have independently reviewed the above radiology studies  and reviewed the findings with the patient. I reviewed the x-ray with the patient, with his previous recent x-rays the observation of possible bony metastasis in the left posterior 6/7 and eighth ribs may be an over call- the patient has no pain or symptoms in  this area. He does have some increase in the left pleural effusion and this will be re-tapped.    Recent Lab Findings: Lab Results  Component Value Date   WBC 5.6 09/26/2016   HGB 10.7 (L) 09/26/2016   HCT 31.7 (L) 09/26/2016   PLT 232 09/26/2016   GLUCOSE 138 09/26/2016   ALT 21 09/26/2016   AST 33 09/26/2016   NA 134 (L) 09/26/2016   K 4.5 09/26/2016   CL 108 08/31/2016   CREATININE 0.6 (L) 09/26/2016   BUN 13.1 09/26/2016   CO2 27 09/26/2016   TSH 2.422 05/03/2016   INR 1.42 08/28/2016   Wt Readings from Last 3 Encounters:  09/27/16 132 lb 9.6 oz (60.1 kg)  09/26/16 133 lb 1.6 oz (60.4 kg)  09/14/16 132 lb 8 oz (60.1 kg)      Assessment / Plan:    Patient continues to make steady progress following esophagectomy, continues to increase his weight- will plan continue nighttime tube feedings and nothing by mouth diet during the day Repeat left thoracentesis for effusion, will again check cytology and also chemistries including lipid I discussed  The chest x-ray with the patient, with his previous recent x-rays the observation of possible bony metastasis in the left posterior 6/7 and eighth ribs may be an over call- the patient,  has no pain or symptoms in this area.  [name] MD      [address] [place] [address] Office [phone]   Beeper [phone]  09/27/2016 10:18 AM

## 2016-10-02 ENCOUNTER — Ambulatory Visit: Payer: Non-veteran care

## 2016-10-02 ENCOUNTER — Ambulatory Visit (HOSPITAL_BASED_OUTPATIENT_CLINIC_OR_DEPARTMENT_OTHER): Payer: Non-veteran care | Admitting: Hematology

## 2016-10-02 ENCOUNTER — Telehealth: Payer: Self-pay | Admitting: Hematology

## 2016-10-02 ENCOUNTER — Other Ambulatory Visit (HOSPITAL_BASED_OUTPATIENT_CLINIC_OR_DEPARTMENT_OTHER): Payer: Non-veteran care

## 2016-10-02 ENCOUNTER — Encounter: Payer: Self-pay | Admitting: Hematology

## 2016-10-02 ENCOUNTER — Ambulatory Visit (HOSPITAL_BASED_OUTPATIENT_CLINIC_OR_DEPARTMENT_OTHER): Payer: Non-veteran care

## 2016-10-02 VITALS — BP 106/54 | HR 100 | Temp 97.9°F | Resp 18 | Ht 65.0 in | Wt 131.7 lb

## 2016-10-02 DIAGNOSIS — J90 Pleural effusion, not elsewhere classified: Secondary | ICD-10-CM | POA: Diagnosis not present

## 2016-10-02 DIAGNOSIS — C7951 Secondary malignant neoplasm of bone: Secondary | ICD-10-CM

## 2016-10-02 DIAGNOSIS — I10 Essential (primary) hypertension: Secondary | ICD-10-CM | POA: Diagnosis not present

## 2016-10-02 DIAGNOSIS — Z5111 Encounter for antineoplastic chemotherapy: Secondary | ICD-10-CM | POA: Diagnosis not present

## 2016-10-02 DIAGNOSIS — C155 Malignant neoplasm of lower third of esophagus: Secondary | ICD-10-CM

## 2016-10-02 LAB — COMPREHENSIVE METABOLIC PANEL
ALBUMIN: 2.8 g/dL — AB (ref 3.5–5.0)
ALT: 38 U/L (ref 0–55)
ANION GAP: 10 meq/L (ref 3–11)
AST: 41 U/L — ABNORMAL HIGH (ref 5–34)
Alkaline Phosphatase: 284 U/L — ABNORMAL HIGH (ref 40–150)
BILIRUBIN TOTAL: 0.4 mg/dL (ref 0.20–1.20)
BUN: 12.8 mg/dL (ref 7.0–26.0)
CALCIUM: 9.3 mg/dL (ref 8.4–10.4)
CO2: 25 mEq/L (ref 22–29)
CREATININE: 0.6 mg/dL — AB (ref 0.7–1.3)
Chloride: 102 mEq/L (ref 98–109)
EGFR: >90 mL/min/{1.73_m2} (ref >90)
Glucose: 112 mg/dl (ref 70–140)
Potassium: 4.3 mEq/L (ref 3.5–5.1)
Sodium: 137 mEq/L (ref 136–145)
TOTAL PROTEIN: 6.5 g/dL (ref 6.4–8.3)

## 2016-10-02 LAB — CBC & DIFF AND RETIC
BASO%: 0.5 % (ref 0.0–2.0)
Basophils Absolute: 0 10*3/uL (ref 0.0–0.1)
EOS%: 1.4 % (ref 0.0–7.0)
Eosinophils Absolute: 0.1 10*3/uL (ref 0.0–0.5)
HCT: 29.6 % — ABNORMAL LOW (ref 38.4–49.9)
HEMOGLOBIN: 10 g/dL — AB (ref 13.0–17.1)
Immature Retic Fract: 22.2 % — ABNORMAL HIGH (ref 3.00–10.60)
LYMPH%: 21.9 % (ref 14.0–49.0)
MCH: 32.3 pg (ref 27.2–33.4)
MCHC: 33.8 g/dL (ref 32.0–36.0)
MCV: 95.5 fL (ref 79.3–98.0)
MONO#: 0.8 10*3/uL (ref 0.1–0.9)
MONO%: 19.4 % — AB (ref 0.0–14.0)
NEUT%: 56.8 % (ref 39.0–75.0)
NEUTROS ABS: 2.5 10*3/uL (ref 1.5–6.5)
NRBC: 0 % (ref 0–0)
PLATELETS: 289 10*3/uL (ref 140–400)
RBC: 3.1 10*6/uL — ABNORMAL LOW (ref 4.20–5.82)
RDW: 19.5 % — AB (ref 11.0–14.6)
Retic %: 4.59 % — ABNORMAL HIGH (ref 0.80–1.80)
Retic Ct Abs: 142.29 10*3/uL — ABNORMAL HIGH (ref 34.80–93.90)
WBC: 4.3 10*3/uL (ref 4.0–10.3)
lymph#: 1 10*3/uL (ref 0.9–3.3)

## 2016-10-02 LAB — CULTURE, BLOOD (SINGLE)

## 2016-10-02 LAB — CEA (IN HOUSE-CHCC): CEA (CHCC-IN HOUSE): 397.9 ng/mL — AB (ref 0.00–5.00)

## 2016-10-02 MED ORDER — DEXAMETHASONE SODIUM PHOSPHATE 10 MG/ML IJ SOLN
10.0000 mg | Freq: Once | INTRAMUSCULAR | Status: AC
Start: 2016-10-02 — End: 2016-10-02
  Administered 2016-10-02: 10 mg via INTRAVENOUS

## 2016-10-02 MED ORDER — SODIUM CHLORIDE 0.9% FLUSH
10.0000 mL | INTRAVENOUS | Status: DC | PRN
  Filled 2016-10-02: qty 10

## 2016-10-02 MED ORDER — SODIUM CHLORIDE 0.9 % IV SOLN
2000.0000 mg/m2 | INTRAVENOUS | Status: DC
  Administered 2016-10-02: 3300 mg via INTRAVENOUS
  Filled 2016-10-02: qty 66

## 2016-10-02 MED ORDER — PALONOSETRON HCL INJECTION 0.25 MG/5ML
0.2500 mg | Freq: Once | INTRAVENOUS | Status: AC
  Administered 2016-10-02: 0.25 mg via INTRAVENOUS

## 2016-10-02 MED ORDER — SODIUM CHLORIDE 0.9 % IV SOLN
Freq: Once | INTRAVENOUS | Status: AC
  Administered 2016-10-02: 11:00:00 via INTRAVENOUS

## 2016-10-02 MED ORDER — DEXAMETHASONE SODIUM PHOSPHATE 10 MG/ML IJ SOLN
INTRAMUSCULAR | Status: AC
  Filled 2016-10-02: qty 1

## 2016-10-02 MED ORDER — DEXTROSE 5 % IV SOLN
Freq: Once | INTRAVENOUS | Status: AC
  Administered 2016-10-02: 12:00:00 via INTRAVENOUS

## 2016-10-02 MED ORDER — HEPARIN SOD (PORK) LOCK FLUSH 100 UNIT/ML IV SOLN
500.0000 [IU] | Freq: Once | INTRAVENOUS | Status: DC | PRN
  Filled 2016-10-02: qty 5

## 2016-10-02 MED ORDER — DEXTROSE 5 % IV SOLN
60.0000 mg/m2 | Freq: Once | INTRAVENOUS | Status: AC
  Administered 2016-10-02: 100 mg via INTRAVENOUS
  Filled 2016-10-02: qty 20

## 2016-10-02 MED ORDER — LEUCOVORIN CALCIUM INJECTION 350 MG
400.0000 mg/m2 | Freq: Once | INTRAMUSCULAR | Status: AC
  Administered 2016-10-02: 656 mg via INTRAVENOUS
  Filled 2016-10-02: qty 32.8

## 2016-10-02 MED ORDER — PALONOSETRON HCL INJECTION 0.25 MG/5ML
INTRAVENOUS | Status: AC
  Filled 2016-10-02: qty 5

## 2016-10-02 NOTE — Telephone Encounter (Signed)
Gave patient avs report and appointments for March and April.  °

## 2016-10-02 NOTE — Progress Notes (Signed)
OK to treat with AST 41 as per Dr. Burr Medico.

## 2016-10-02 NOTE — Patient Instructions (Signed)
[facility] Discharge Instructions for Patients Receiving Chemotherapy  Today you received the following chemotherapy agents Oxaliplatin, Leucovorin, and 5 FU  To help prevent nausea and vomiting after your treatment, we encourage you to take your nausea medication as directed If you develop nausea and vomiting that is not controlled by your nausea medication, call the clinic.   BELOW ARE SYMPTOMS THAT SHOULD BE REPORTED IMMEDIATELY:  *FEVER GREATER THAN 100.5 F  *CHILLS WITH OR WITHOUT FEVER  NAUSEA AND VOMITING THAT IS NOT CONTROLLED WITH YOUR NAUSEA MEDICATION  *UNUSUAL SHORTNESS OF BREATH  *UNUSUAL BRUISING OR BLEEDING  TENDERNESS IN MOUTH AND THROAT WITH OR WITHOUT PRESENCE OF ULCERS  *URINARY PROBLEMS  *BOWEL PROBLEMS  UNUSUAL RASH Items with * indicate a potential emergency and should be followed up as soon as possible.  Feel free to call the clinic you have any questions or concerns. The clinic phone number is [phone].  Please show the CHEMO ALERT CARD at check-in to the Emergency Department and triage nurse.   

## 2016-10-02 NOTE — Progress Notes (Signed)
Nutrition Follow-up:   Nutrition follow-up completed in infusion this pm.  Spoke with patient and wife.  Wife reports that patient is taking 4 -5 cans of osmolite 1.5 at this time as he is eating more orally (mostly 4 cans).  Wife reports that patient eats more orally when only taking 4 cans of formula. Wife reports patient feels full and can't tolerate additional cans of osmolite. Now has automatic water flush pump but wife reports still having some trouble with tube getting clogged at times.   Wife reports yesterday patient was able to eat eggs, sausage and gravy (small portion) for breakfast then had barbecue chicken and mashed potatoes for supper and scoop of ice cream.   Water flush continues at 39ml of water before and after starting pump then 58ml per hour water flush during continuous feeding.    Noted paracentesis on 2/28 with 1.5 liters removed  Medications: reviewed  Labs: reviewed  Anthropometrics:   Patient weight today 130 pounds decreased from 132 pounds on 2/23.     Estimated Energy Needs  Kcals: 2030-2230 calories/d Protein: 85-100 g/d Fluid: 2.2 L/d  NUTRITION DIAGNOSIS: Unintentional weight loss continues   INTERVENTION:   Continue with 4 cans of osmolite 1.5 via J tube continuous at this time.  Will monitor weight and oral nutrition.  Patient and wife aware that will need to increase tube feeding regimen if patient continues to lose weight.    MONITORING, EVALUATION, GOAL: Patient will tolerate continuous feeding regimen to prevent weight loss   NEXT VISIT: as needed  Zaya Kessenich B. Zenia Resides, [place], [place] Registered Dietitian [phone] (pager)

## 2016-10-02 NOTE — Patient Instructions (Signed)
Implanted Port Home Guide An implanted port is a type of central line that is placed under the skin. Central lines are used to provide IV access when treatment or nutrition needs to be given through a person's veins. Implanted ports are used for long-term IV access. An implanted port may be placed because:  You need IV medicine that would be irritating to the small veins in your hands or arms.  You need long-term IV medicines, such as antibiotics.  You need IV nutrition for a long period.  You need frequent blood draws for lab tests.  You need dialysis.  Implanted ports are usually placed in the chest area, but they can also be placed in the upper arm, the abdomen, or the leg. An implanted port has two main parts:  Reservoir. The reservoir is round and will appear as a small, raised area under your skin. The reservoir is the part where a needle is inserted to give medicines or draw blood.  Catheter. The catheter is a thin, flexible tube that extends from the reservoir. The catheter is placed into a large vein. Medicine that is inserted into the reservoir goes into the catheter and then into the vein.  How will I care for my incision site? Do not get the incision site wet. Bathe or shower as directed by your health care provider. How is my port accessed? Special steps must be taken to access the port:  Before the port is accessed, a numbing cream can be placed on the skin. This helps numb the skin over the port site.  Your health care provider uses a sterile technique to access the port. ? Your health care provider must put on a mask and sterile gloves. ? The skin over your port is cleaned carefully with an antiseptic and allowed to dry. ? The port is gently pinched between sterile gloves, and a needle is inserted into the port.  Only "non-coring" port needles should be used to access the port. Once the port is accessed, a blood return should be checked. This helps ensure that the port  is in the vein and is not clogged.  If your port needs to remain accessed for a constant infusion, a clear (transparent) bandage will be placed over the needle site. The bandage and needle will need to be changed every week, or as directed by your health care provider.  Keep the bandage covering the needle clean and dry. Do not get it wet. Follow your health care provider's instructions on how to take a shower or bath while the port is accessed.  If your port does not need to stay accessed, no bandage is needed over the port.  What is flushing? Flushing helps keep the port from getting clogged. Follow your health care provider's instructions on how and when to flush the port. Ports are usually flushed with saline solution or a medicine called heparin. The need for flushing will depend on how the port is used.  If the port is used for intermittent medicines or blood draws, the port will need to be flushed: ? After medicines have been given. ? After blood has been drawn. ? As part of routine maintenance.  If a constant infusion is running, the port may not need to be flushed.  How long will my port stay implanted? The port can stay in for as long as your health care provider thinks it is needed. When it is time for the port to come out, surgery will be   done to remove it. The procedure is similar to the one performed when the port was put in. When should I seek immediate medical care? When you have an implanted port, you should seek immediate medical care if:  You notice a bad smell coming from the incision site.  You have swelling, redness, or drainage at the incision site.  You have more swelling or pain at the port site or the surrounding area.  You have a fever that is not controlled with medicine.  This information is not intended to replace advice given to you by your health care provider. Make sure you discuss any questions you have with your health care provider. Document  Released: 07/09/2005 Document Revised: 12/15/2015 Document Reviewed: 03/16/2013 [employer] Interactive Patient Education  2017 [employer]  

## 2016-10-04 ENCOUNTER — Ambulatory Visit (HOSPITAL_BASED_OUTPATIENT_CLINIC_OR_DEPARTMENT_OTHER): Payer: Non-veteran care

## 2016-10-04 VITALS — BP 117/55 | HR 78 | Temp 98.3°F | Resp 18

## 2016-10-04 DIAGNOSIS — I959 Hypotension, unspecified: Secondary | ICD-10-CM

## 2016-10-04 DIAGNOSIS — C155 Malignant neoplasm of lower third of esophagus: Secondary | ICD-10-CM

## 2016-10-04 MED ORDER — SODIUM CHLORIDE 0.9 % IV SOLN
Freq: Once | INTRAVENOUS | Status: AC
  Administered 2016-10-04: 13:00:00 via INTRAVENOUS

## 2016-10-04 MED ORDER — HEPARIN SOD (PORK) LOCK FLUSH 100 UNIT/ML IV SOLN
500.0000 [IU] | Freq: Once | INTRAVENOUS | Status: AC | PRN
  Administered 2016-10-04: 500 [IU]
  Filled 2016-10-04: qty 5

## 2016-10-04 MED ORDER — SODIUM CHLORIDE 0.9% FLUSH
10.0000 mL | INTRAVENOUS | Status: DC | PRN
  Administered 2016-10-04: 10 mL
  Filled 2016-10-04: qty 10

## 2016-10-04 NOTE — Progress Notes (Signed)
Patient hypotensive. Patient denies dizziness or weakness. However, patient required additional fluids with his chemotherapy treatment earlier this week. Patient's spouse reports patient does not drink well at home. Selena Lesser, NP notified. Order given and carried out for normal saline over 2 hours. Patient and spouse verbalized understanding.

## 2016-10-04 NOTE — Patient Instructions (Addendum)
Dehydration, Adult Dehydration is a condition in which there is not enough fluid or water in the body. This happens when you lose more fluids than you take in. Important organs, such as the kidneys, brain, and heart, cannot function without a proper amount of fluids. Any loss of fluids from the body can lead to dehydration. Dehydration can range from mild to severe. This condition should be treated right away to prevent it from becoming severe. What are the causes? This condition may be caused by:  Vomiting.  Diarrhea.  Excessive sweating, such as from heat exposure or exercise.  Not drinking enough fluid, especially:  When ill.  While doing activity that requires a lot of energy.  Excessive urination.  Fever.  Infection.  Certain medicines, such as medicines that cause the body to lose excess fluid (diuretics).  Inability to access safe drinking water.  Reduced physical ability to get adequate water and food. What increases the risk? This condition is more likely to develop in people:  Who have a poorly controlled long-term (chronic) illness, such as diabetes, heart disease, or kidney disease.  Who are age 65 or older.  Who are disabled.  Who live in a place with high altitude.  Who play endurance sports. What are the signs or symptoms? Symptoms of mild dehydration may include:   Thirst.  Dry lips.  Slightly dry mouth.  Dry, warm skin.  Dizziness. Symptoms of moderate dehydration may include:   Very dry mouth.  Muscle cramps.  Dark urine. Urine may be the color of tea.  Decreased urine production.  Decreased tear production.  Heartbeat that is irregular or faster than normal (palpitations).  Headache.  Light-headedness, especially when you stand up from a sitting position.  Fainting (syncope). Symptoms of severe dehydration may include:   Changes in skin, such as:  Cold and clammy skin.  Blotchy (mottled) or pale skin.  Skin that does  not quickly return to normal after being lightly pinched and released (poor skin turgor).  Changes in body fluids, such as:  Extreme thirst.  No tear production.  Inability to sweat when body temperature is high, such as in hot weather.  Very little urine production.  Changes in vital signs, such as:  Weak pulse.  Pulse that is more than 100 beats a minute when sitting still.  Rapid breathing.  Low blood pressure.  Other changes, such as:  Sunken eyes.  Cold hands and feet.  Confusion.  Lack of energy (lethargy).  Difficulty waking up from sleep.  Short-term weight loss.  Unconsciousness. How is this diagnosed? This condition is diagnosed based on your symptoms and a physical exam. Blood and urine tests may be done to help confirm the diagnosis. How is this treated? Treatment for this condition depends on the severity. Mild or moderate dehydration can often be treated at home. Treatment should be started right away. Do not wait until dehydration becomes severe. Severe dehydration is an emergency and it needs to be treated in a hospital. Treatment for mild dehydration may include:   Drinking more fluids.  Replacing salts and minerals in your blood (electrolytes) that you may have lost. Treatment for moderate dehydration may include:   Drinking an oral rehydration solution (ORS). This is a drink that helps you replace fluids and electrolytes (rehydrate). It can be found at pharmacies and retail stores. Treatment for severe dehydration may include:   Receiving fluids through an IV tube.  Receiving an electrolyte solution through a feeding tube that is   passed through your nose and into your stomach (nasogastric tube, or NG tube).  Correcting any abnormalities in electrolytes.  Treating the underlying cause of dehydration. Follow these instructions at home:  If directed by your health care provider, drink an ORS:  Make an ORS by following instructions on the  package.  Start by drinking small amounts, about  cup (120 mL) every 5-10 minutes.  Slowly increase how much you drink until you have taken the amount recommended by your health care provider.  Drink enough clear fluid to keep your urine clear or pale yellow. If you were told to drink an ORS, finish the ORS first, then start slowly drinking other clear fluids. Drink fluids such as:  Water. Do not drink only water. Doing that can lead to having too little salt (sodium) in the body (hyponatremia).  Ice chips.  Fruit juice that you have added water to (diluted fruit juice).  Low-calorie sports drinks.  Avoid:  Alcohol.  Drinks that contain a lot of sugar. These include high-calorie sports drinks, fruit juice that is not diluted, and soda.  Caffeine.  Foods that are greasy or contain a lot of fat or sugar.  Take over-the-counter and prescription medicines only as told by your health care provider.  Do not take sodium tablets. This can lead to having too much sodium in the body (hypernatremia).  Eat foods that contain a healthy balance of electrolytes, such as bananas, oranges, potatoes, tomatoes, and spinach.  Keep all follow-up visits as told by your health care provider. This is important. Contact a health care provider if:  You have abdominal pain that:  Gets worse.  Stays in one area (localizes).  You have a rash.  You have a stiff neck.  You are more irritable than usual.  You are sleepier or more difficult to wake up than usual.  You feel weak or dizzy.  You feel very thirsty.  You have urinated only a small amount of very dark urine over 6-8 hours. Get help right away if:  You have symptoms of severe dehydration.  You cannot drink fluids without vomiting.  Your symptoms get worse with treatment.  You have a fever.  You have a severe headache.  You have vomiting or diarrhea that:  Gets worse.  Does not go away.  You have blood or green matter  (bile) in your vomit.  You have blood in your stool. This may cause stool to look black and tarry.  You have not urinated in 6-8 hours.  You faint.  Your heart rate while sitting still is over 100 beats a minute.  You have trouble breathing. This information is not intended to replace advice given to you by your health care provider. Make sure you discuss any questions you have with your health care provider. Document Released: 07/09/2005 Document Revised: 02/03/2016 Document Reviewed: 09/02/2015 Elsevier Interactive Patient Education  2017 [employer]  

## 2016-10-05 ENCOUNTER — Encounter (HOSPITAL_COMMUNITY): Payer: Self-pay

## 2016-10-15 NOTE — Progress Notes (Signed)
Fred [name]  [phone] [phone]  Clinic Follow up Note   Patient Care Team: [name], MD as PCP - General (Internal Medicine) Truitt Merle, MD as Consulting Physician (Hematology) Kyung Rudd, MD as Consulting Physician (Radiation Oncology) [name], MD as Consulting Physician (Cardiothoracic Surgery) Karie Mainland, RD as Dietitian (Nutrition) Minus Breeding, MD as Consulting Physician (Cardiology)   CHIEF COMPLAINTS:  Follow up metastatic esophageal cancer    Oncology History   Presented with solid dysphagia at least daily with chest pain. Involuntary weight loss of 10-12 lb over past year  Malignant neoplasm of lower third of esophagus (HCC)   Staging form: Esophagus - Adenocarcinoma, AJCC 7th Edition   - Clinical stage from 12/15/2015: Stage IIIA (T3, N1, M0) - Signed by Truitt Merle, MD on 12/24/2015   - Pathologic stage from 04/26/2016: yT0, N0, cM0, GX - Signed by [name], MD on 04/27/2016      Malignant neoplasm of lower third of esophagus ([place])   11/23/2015 Procedure    EGD per [facility](Dr. Toledo):6cm mass in lower third of esophagus      11/24/2015 Initial Diagnosis    Esophageal cancer ([place])      11/24/2015 Pathology Results    Mucinous adenocarcinoma, moderately differentiated-Her2 analysis pending      12/05/2015 Imaging    PET scan showed hypermetabolic a mass in distal esophagus, hypermetabolic activity in the left anterior prostate gland, metabolic density in left adrenal nodule, CT attenuation suggest a benign adrenal adenoma. Left apical 2 mm lung nodule.      12/15/2015 Procedure    EUS showed a uT3N1 lesion from 27cm to 35cm from the incisors       12/26/2015 - 02/02/2016 Chemotherapy    Weekly carboplatin AUC 2 and Taxol 45 mg/m, with concurrent radiation      12/26/2015 - 02/02/2016 Radiation Therapy    Neoadjuvant chemoradiation to the esophageal cancer      04/23/2016 Surgery     total esophagectomy with cervical esophagogastrostomy, pyloroplasty, feeding jejunostomy       04/23/2016 Pathology Results    Esophagectomy showed no residual tumor, 8 lymph nodes were all negative, incidental leiomyoma 0.2 cm      08/14/2016 PET scan    IMPRESSION: 1. Small hypermetabolic left adrenal mass, this has not changed in size compared to 12/01/15 but has a maximum standard uptake value of 6.2 (previously 4.2). Indeterminate density and MRI characteristics, not specific for adenoma. Early metastatic disease is not readily excluded although the lack of change of size is somewhat reassuring. Surveillance recommended. 2. No other hypermetabolic lesions are identified. 3. Moderate to large left pleural effusion with passive atelectasis. 4. Esophagectomy with gastric pull-through. 5. No hypermetabolic liver lesions are seen. 6. Enlarged prostate gland.      08/16/2016 - 08/23/2016 Hospital Admission    He was diagnosed with DIC, unclear etiology. He was given blood products. He underwent echocardiogram which was negative for endocarditis, CTA chest/abd which was negative for aneurysm or dissection as work up for DIC. He was on prednisone for some time, but stopped as it was not helping much for DIC.       08/28/2016 - 09/01/2016 Hospital Admission    He has had issues with recurrent L sided pleural effusions, DIC and melena. He was last admitted in 1/25, given supportive care with a thoracentesis and blood transfusion. Dr Burr Medico has been following DIC and has requested a direct admit for melena,  anemia and thrombocytopenia. GI called for EGD.        08/31/2016 Pathology Results    Bone Marrow Biopsy, right iliac - METASTATIC ADENOCARCINOMA. - SEE COMMENT. PERIPHERAL BLOOD: - NORMOCYTIC ANEMIA. - THROMBOCYTOPENIA.      08/31/2016 Progression    Bone marrow biopsy showed metastatic adenocarcinoma      09/04/2016 -  Chemotherapy    FOLFOX every 2 weeks       09/19/2016 Procedure     Therapeutic thoracentesis of a left pleural effusion performed on 09/19/16 yielded 1.5 liters of blood-tinged fluid. Reactive appearing mesothelial cells were present.       HISTORY OF PRESENTING ILLNESS (12/14/2015):  Fred [name] 76 y.o. male is here because of His newly diagnosed esophageal cancer. He is accompanied by his wife and daughter to my clinic today.  He has had dysphagia for 2 months, mainly with solid food, no dysphagia with soft food or liquid. He also has mild pain in mid chest when he swallows. No nausea, abdominal pain, or other discomfort. He has lost about 15 lbs in the last year, no other symoptoms. He was seen by his primary care physician at [facility], and referred to gastroenterologist Dr. Alice Reichert. He underwent EGD on 11/23/2015, which showed a 6 cm mass in the lower third of esophagus. Biopsy showed adenocarcinoma, moderately differentiated. He was referred to [place] to consider neoadjuvant therapy. He is scheduled to see a Psychologist, sport and exercise at [facility] later this week.  He has had some urinary frequency, underwent TURP on 11/02/2015.   CURRENT THERAPY: first line chemo FOLFOX every 2 weeks started on 09/04/2016  INTERIM HISTORY: Fred [name] returns for follow up with his wife. The patient reports bleeding around his stoma occasionally when he removes bandaging. He notes drainage that is mucous looking occasionally. He changes his bandage every other day or sometimes once daily. He reports some days the feeding tube site hurts and others it does not. He does 12 feedings per day. He is taking in more and more orally daily. He had a sausage biscuit for breakfast. He denies vomiting or any problems keeping food down for the last several weeks. He is able to eat whatever he likes, he just cannot eat much at a time. He denies any major issues with chemotherapy.    MEDICAL HISTORY:  Past Medical History:  Diagnosis Date  . Esophageal cancer ([place]) 11/23/15   lower 3rd esohagus   . GERD  (gastroesophageal reflux disease)   . Hyperlipidemia   . Hypertension     SURGICAL HISTORY: Past Surgical History:  Procedure Laterality Date  . CHEST TUBE INSERTION Left 04/23/2016   Procedure: CHEST TUBE INSERTION;  Surgeon: [name], MD;  Location: [place];  Service: Thoracic;  Laterality: Left;  . COLONOSCOPY N/A 08/30/2016   Procedure: COLONOSCOPY;  Surgeon: Milus Banister, MD;  Location: [facility];  Service: Endoscopy;  Laterality: N/A;  . COMPLETE ESOPHAGECTOMY N/A 04/23/2016   Procedure: TRANSHIATIAL TOTAL ESOPHAGECTOMY COMPLETE; CERVICAL ESOPHAGOGASTROSTOMY AND PYLOROPLASTY;  Surgeon: [name], MD;  Location: [place];  Service: Thoracic;  Laterality: N/A;  . cystoscope  4/12/1   prostate  . ERCP    . ESOPHAGOGASTRODUODENOSCOPY (EGD) WITH PROPOFOL N/A 08/29/2016   Procedure: ESOPHAGOGASTRODUODENOSCOPY (EGD) WITH PROPOFOL;  Surgeon: Milus Banister, MD;  Location: [facility];  Service: Endoscopy;  Laterality: N/A;  . INGUINAL HERNIA REPAIR    . IR GENERIC HISTORICAL  08/31/2016   IR FLUORO GUIDE PORT INSERTION RIGHT  08/31/2016 Corrie Mckusick, DO [facility]  . IR GENERIC HISTORICAL  08/31/2016   IR US GUIDE VASC ACCESS RIGHT 08/31/2016 Corrie Mckusick, DO [facility]  . JEJUNOSTOMY N/A 04/23/2016   Procedure: FEEDING JEJUNOSTOMY;  Surgeon: [name], MD;  Location: [place];  Service: Thoracic;  Laterality: N/A;  . LEG SURGERY Left    hole  . PROSTATE BIOPSY  10/05/15  . right shoulder surgery    . VIDEO BRONCHOSCOPY N/A 04/23/2016   Procedure: VIDEO BRONCHOSCOPY;  Surgeon: [name], MD;  Location: [facility] OR;  Service: Thoracic;  Laterality: N/A;    SOCIAL HISTORY: Social History   Social History  . Marital status: Married    Spouse name: N/A  . Number of children: 1  . Years of education: N/A   Occupational History  . Truck Geophysicist/field seismologist    Social History Main Topics  . Smoking status: Former Smoker    Packs/day: 1.00    Years: 10.00    Quit date:  07/24/1967  . Smokeless tobacco: Never Used  . Alcohol use No  . Drug use: No  . Sexual activity: Not Currently   Other Topics Concern  . Not on file   Social History Narrative  . No narrative on file    FAMILY HISTORY: Family History  Problem Relation Age of Onset  . Cancer Maternal Grandfather 29    gastric cancer   . Alzheimer's disease Mother   . Diabetes Mother   . Stroke Father 38  . Multiple sclerosis Sister     ALLERGIES:  is allergic to no known allergies.  MEDICATIONS:  Current Outpatient Prescriptions  Medication Sig Dispense Refill  . acetaminophen (TYLENOL) 325 MG tablet Take 650 mg by mouth every 6 (six) hours as needed.    Marland Kitchen atenolol (TENORMIN) 25 MG tablet Take 1 tab in the morning and half tab in the evening (Patient taking differently: Take 12.5-25 mg by mouth 2 (two) times daily. Take 34m in the morning and 12.588min the evening) 60 tablet 1  . folic acid (FOLVITE) 1 MG tablet Take 1 tablet (1 mg total) by mouth daily. 30 tablet 0  . lidocaine-prilocaine (EMLA) cream Apply 1 application topically as needed. Apply 1-2 tsp over port site 1 hour prior to chemotherapy 30 g 1  . Nutritional Supplements (FEEDING SUPPLEMENT, OSMOLITE 1.5 CAL,) LIQD Increase osmolite 1.5 to 6 cans to be infused via continuous pump for 19 hours per day. Flush with 18078mf water before and after continuous feeding.  Give an additional 180m14m water via tube.  Drink an additional 600ml34mwater during the day.  Send formula and bags. 1422 mL 0  . ondansetron (ZOFRAN ODT) 8 MG disintegrating tablet Take 1 tablet (8 mg total) by mouth every 8 (eight) hours as needed for nausea or vomiting. 30 tablet 2  . prochlorperazine (COMPAZINE) 10 MG tablet Take 1 tablet (10 mg total) by mouth every 6 (six) hours as needed for nausea or vomiting. 30 tablet 3  . vitamin B-12 1000 MCG tablet Take 1 tablet (1,000 mcg total) by mouth daily. 30 tablet 0  . Water For Irrigation, Sterile (FREE WATER) SOLN  Place 220 mLs into feeding tube 4 (four) times daily.    . diphenhydrAMINE (BENADRYL) 25 MG tablet Take 25 mg by mouth every 6 (six) hours as needed.    . mirtazapine (REMERON) 15 MG tablet Take 1 tablet (15 mg total) by mouth at bedtime. (Patient not taking: Reported on  10/17/2016) 30 tablet 2  . oxycodone (OXY-IR) 5 MG capsule Take 5 mg by mouth every 6 (six) hours as needed for pain.      No current facility-administered medications for this visit.     REVIEW OF SYSTEMS:   Constitutional: Denies fevers, chills or abnormal night sweats Eyes: Denies blurriness of vision, double vision or watery eyes Ears, nose, mouth, throat, and face: Denies mucositis or sore throat Respiratory: Denies cough, dyspnea or wheezes Cardiovascular: Denies palpitation, chest discomfort or lower extremity swelling Gastrointestinal:  Denies heartburn or change in bowel habits Skin: Denies abnormal skin rashes Lymphatics: Denies new lymphadenopathy or easy bruising Neurological:Denies numbness, tingling or new weaknesses Behavioral/Psych: Mood is stable, no new changes  Musculoskeletal: (+) L hip pain All other systems were reviewed with the patient and are negative.  PHYSICAL EXAMINATION: ECOG PERFORMANCE STATUS: 2  BP 106/86 (BP Location: Left Arm, Patient Position: Sitting)   Pulse 96   Temp 98.4 F (36.9 C) (Oral)   Resp 18   Ht 5' 5"  (1.651 m)   Wt 134 lb 6.4 oz (61 kg)   SpO2 100%   BMI 22.37 kg/m    GENERAL:alert, no distress and comfortable, in wheelchair SKIN: Appears to be pale, skin texture, turgor are normal, no rashes or significant lesions EYES: normal, conjunctiva are pink and non-injected, sclera clear OROPHARYNX:no exudate, no erythema and lips, buccal mucosa, and tongue normal  NECK: supple, thyroid normal size, non-tender, without nodularity LYMPH:  no palpable lymphadenopathy in the cervical, axillary or inguinal LUNGS: clear to auscultation and percussion with normal breathing  effort (+) except decreased breath sound at the left lung base. HEART: regular rate & rhythm and no murmurs and no lower extremity edema ABDOMEN:abdomen soft, non-tender and normal bowel sounds, J tube in place. No skin erythema or pus. (+) Hepatomegaly, liver is palpable 2-3 cm below the rib cage, nontender. (+) Feeding tube site with small bleeding around feeding tube site, minimal amount of yellowish secretion. No skin erythema around J tube. Musculoskeletal:no cyanosis of digits and no clubbing  PSYCH: alert & oriented x 3 with fluent speech NEURO: no focal motor/sensory deficits  LABORATORY DATA:  I have reviewed the data as listed CBC Latest Ref Rng & Units 10/17/2016 10/02/2016 09/26/2016  WBC 4.0 - 10.3 10e3/uL 5.3 4.3 5.6  Hemoglobin 13.0 - 17.1 g/dL 10.8(L) 10.0(L) 10.7(L)  Hematocrit 38.4 - 49.9 % 32.0(L) 29.6(L) 31.7(L)  Platelets 140 - 400 10e3/uL 181 289 232   CMP Latest Ref Rng & Units 10/17/2016 10/02/2016 09/26/2016  Glucose 70 - 140 mg/dl 127 112 138  BUN 7.0 - 26.0 mg/dL 12.7 12.8 13.1  Creatinine 0.7 - 1.3 mg/dL 0.6(L) 0.6(L) 0.6(L)  Sodium 136 - 145 mEq/L 137 137 134(L)  Potassium 3.5 - 5.1 mEq/L 4.4 4.3 4.5  Chloride 101 - 111 mmol/L - - -  CO2 22 - 29 mEq/L 24 25 27   Calcium 8.4 - 10.4 mg/dL 9.4 9.3 9.2  Total Protein 6.4 - 8.3 g/dL 6.3(L) 6.5 6.4  Total Bilirubin 0.20 - 1.20 mg/dL 0.41 0.40 0.60  Alkaline Phos 40 - 150 U/L 180(H) 284(H) 404(H)  AST 5 - 34 U/L 22 41(H) 33  ALT 0 - 55 U/L 18 38 21   Results for Fred [name], Fred [name] (MRN [id number]) as of 10/17/2016 12:27  Ref. Range 08/09/2016 13:04 09/07/2016 10:56 10/02/2016 09:17  CEA Latest Ref Range: 0.0 - 4.7 ng/mL 2,382.0 (H) 4,056.0 (H)   CEA ([facility]) Latest Ref Range: 0.00 -  5.00 ng/mL 1,731.10 x 2 dilu... (H) 2,612.34 Result C... (H) 397.90 (H)    Pathology report  PD-L1 Testing 09/29/2016   Diagnosis 09/27/2016 Consult- Comprehensive, [phone] - 1A Mass, Lower third of Esophagus, Mucosal Biopsy -  ADENOCARCINOMA WITH EXTRACELLULAR MUCIN. - SEE COMMENT. Microscopic Comment Provided Her2 IHC with the appropriate controls is negative. Per report Her2 FISH is also negative. There is limited tissue remaining, but Foundation One and PDL-1 testing will be attempted as requested.  Diagnosis 09/19/2016 PLEURAL FLUID, LEFT (SPECIMEN 1 OF 1 COLLECTED 09/19/16): REACTIVE APPEARING MESOTHELIAL CELLS PRESENT.   Diagnosis 08/31/2016 Bone Marrow Biopsy, right iliac - METASTATIC ADENOCARCINOMA. - SEE COMMENT. PERIPHERAL BLOOD: - NORMOCYTIC ANEMIA. - THROMBOCYTOPENIA.  Diagnosis 08/02/16 PLEURAL FLUID, LEFT (SPECIMEN 1 OF 1 COLLECTED 08/02/16): REACTIVE MESOTHELIAL CELLS PRESENT. LYMPHOCYTES PRESENT.  Diagnosis 11/23/2015 Mass, lower third of the esophagus, mucosal biopsy Mucinous adenocarcinoma, moderately differentiated.  Diagnosis 04/23/2016 1. Lymph node, biopsy, Cervical - ONE OF ONE LYMPH NODES NEGATIVE FOR CARCINOMA (0/1). 2. Lymph node, biopsy, Periesophageal - BLOOD WITH SCANT BENIGN SOFT TISSUE AND SKELETAL MUSCLE. - NO MALIGNANCY IDENTIFIED. 3. Esophagogastrectomy - ULCERATION WITH INFLAMMATION AND REACTIVE CHANGES. - FOCAL INTESTINAL METAPLASIA. - CHANGES CONSISTENT WITH TREATMENT EFFECT. - SEVEN OF SEVEN LYMPH NODES NEGATIVE FOR CARCINOMA (0/7). - INCIDENTAL LEIOMYOMA, 0.2 CM. - MARGINS ARE NEGATIVE FOR DYSPLASIA OR MALIGNANCY. - SEE ONCOLOGY TABLE. Microscopic Comment 3. ESOPHAGUS: Specimen: Esophagus and proximal stomach, cervical lymph node. Procedure: Esophagogastrectomy and cervical lymph node biopsy. Tumor Site: Presumed GE junction, see comment. Relationship of Tumor to esophagogastric junction: Can not be assessed. Distance of tumor center from esophagogastric junction: Can not be assessed. Tumor Size Greatest dimension: No residual tumor present. Histologic Type: Per medical record adenocarcinoma (no biopsy for review). Histologic Grade: N/A. Microscopic Tumor  Extension: N/A. Margins: Negative. Treatment Effect: Present (TRS 0). Lymph-Vascular Invasion: Not identified. Perineural Invasion: Not identified. Lymph nodes: number examined 8; number positive: 0 TNM: ypT0, ypN0 see comment. 1 of 3 FINAL for Fred [name], Fred [name] ([id number]) Microscopic Comment(continued) Ancillary studies: None. Comments: The entire GE junction is submitted for evaluation. There is ulceration with associated inflammation. There is acellular mucin/myxoid changes which extend through the muscularis propria, but no viable/residual tumor is identified and thus the stage is ypT0. There is focal background intestinal metaplasia, which may have been pre-existing (Barrett's esophagus) or related to therapy.   RADIOGRAPHIC STUDIES: I have personally reviewed the radiological images as listed and agreed with the findings in the report. Dg Chest 1 View  Result Date: 09/19/2016 CLINICAL DATA:  Status post left thoracentesis today. EXAM: CHEST 1 VIEW COMPARISON:  PA and lateral chest 09/11/2016. FINDINGS: Left pleural effusion is markedly decreased after thoracentesis. No pneumothorax. Right lung is expanded and clear. Postoperative change of esophagectomy and gastric pull-through noted. Heart size is normal. Port-A-Cath is in place. Jejunostomy tube is partially visualized. IMPRESSION: Marked decrease in left pleural effusion after thoracentesis. Negative for pneumothorax. No new abnormality. Electronically Signed   By: Inge Rise M.D.   On: 09/19/2016 11:02   US Thoracentesis Asp Pleural Space W/img Guide  Result Date: 09/19/2016 INDICATION: Patient with history of metastatic esophageal cancer, dyspnea, recurrent left pleural effusion. Request made for diagnostic and therapeutic left thoracentesis. EXAM: ULTRASOUND GUIDED DIAGNOSTIC AND THERAPEUTIC LEFT THORACENTESIS MEDICATIONS: None. COMPLICATIONS: None immediate. PROCEDURE: An ultrasound guided thoracentesis was thoroughly  discussed with the patient and questions answered. The benefits, risks, alternatives and complications were also discussed. The patient understands and wishes to proceed with  the procedure. Written consent was obtained. Ultrasound was performed to localize and mark an adequate pocket of fluid in the left chest. The area was then prepped and draped in the normal sterile fashion. 1% Lidocaine was used for local anesthesia. Under ultrasound guidance a Safe-T-Centesis catheter was introduced. Thoracentesis was performed. The catheter was removed and a dressing applied. FINDINGS: A total of approximately 1.5 liters of blood-tinged fluid was removed. Samples were sent to the laboratory as requested by the clinical team. The Oncology service was notified of the above findings. IMPRESSION: Successful ultrasound guided diagnostic and therapeutic left thoracentesis yielding 1.5 liters of pleural fluid. Read by: Rowe Robert, PA-C Electronically Signed   By: Lucrezia Europe M.D.   On: 09/19/2016 12:37   PET 08/14/2016 IMPRESSION: 1. Small hypermetabolic left adrenal mass, this has not changed in size compared to 12/01/15 but has a maximum standard uptake value of 6.2 (previously 4.2). Indeterminate density and MRI characteristics, not specific for adenoma. Early metastatic disease is not readily excluded although the lack of change of size is somewhat reassuring. Surveillance recommended. 2. No other hypermetabolic lesions are identified. 3. Moderate to large left pleural effusion with passive atelectasis. 4. Esophagectomy with gastric pull-through. 5. No hypermetabolic liver lesions are seen. 6. Enlarged prostate gland.  CT angio chest/abdomen/pelvis for dissection w/wo contrast 08/22/2016 IMPRESSION: No evidence of aortic aneurysm or dissection. No evidence of pulmonary embolus. Heart is borderline in size. Stable small left adrenal nodule which was hypermetabolic on prior PET CT. Stable large left pleural  effusion with compressive atelectasis in the left lower lobe. Stable appearance of the gastric pull-through post esophagectomy.  US Thoracentesis asp pleural space 09/19/2016 IMPRESSION: Successful ultrasound guided diagnostic and therapeutic left thoracentesis yielding 1.5 liters of pleural fluid.  ASSESSMENT & PLAN:  76 y.o. male presented with dysphagia with solid food  1. Distal esophageal adenocarcinoma, uT3N1M0, stage IIIA, ypT0N0M0, diffuse bone metastasis 08/2016 -I previously reviewed his CT scan, PET scan, EGD, and the biopsy findings with patient and his family member in details. -The initial PET scan showed no definitive distant metastasis. 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 This will be followed in the future scan. -By EUS, he had T3 N1 stage III disease. -He completed neoadjuvant radiation with weekly Carbo and Taxol. -I previously reviewed his surgical pathology findings, he has had complete pathologic response to neoadjuvant chemotherapy and radiation, which predicts good prognosis -Unfortunately he developed diffuse bone metastasis in January 2018, confirmed by bone marrow biopsy -He has started first-line chemotherapy FOLFOX, tolerating well, and his anemia and some cytopenia has improved since chemotherapy -The goal of therapy is palliative -I have requested HER2 test and Foundation one on his initial esophageal mass biopsy from outside lab to see if he would benefit from Herceptin and immunotherapy. Unfortunately, there was insufficient tissue. -his tumor was negative for PD-L1 expression, he is not a candidate for immunotherapy -He is clinically doing well overall, has not required any blood transfusions since he started chemotherapy, no significant noticeable side effects from chemotherapy, he has gained some weight. We will continue chemotherapy every 2 weeks. -We discussed his clinical response may be  difficult to evaluate since his PET scan was negative when he had a bone metastasis, we'll based on the blood counts, and CEA level, also repeat PET scan every 3 months to evaluate his response.  -Plan to repeat PET in 3 weeks after next cycle chemo   2.  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 thrombocytopenia has resolved. -The patient had many blood transfusions with the last on 09/28/16, he has not required any blood transfusion since he started chemotherapy. -Continue monitoring CBC  3. 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  -He has had 3 repeated thoracentesis, all cytology were negative, -Therapeutic thoracentesis performed on 09/19/16 yielded 1.5 liters of blood-tinged fluid. Reactive appearing mesothelial cells were present. -Continue monitoring  4. HTN -Continue medication and follow-up of his primary care physician -his PCP at [facility] has changed his BP meds lately   5. Malnutrition, nausea/vomiting, and weight loss -Doing better, he is tolerating J-tube feeding very well, he eats normally. Encouraged the patient to eat small frequent meals. -12 feedings daily -Follow up with dietitian -Feeding tube site has mild bleeding. A picture was taken today and sent to surgeon Dr. Servando Snare  6. Anorexia and depression  -continue mirtazapine. Overall improved lately  -He hasn't talked to the nutritionist in a while, he will follow up   7. Fatigue -He would like something to help his energy. -We discussed steroids to help for this, but I told them that this is not a long term solution -He would not like steroids at this time.   8. Goal of care discussion  -We discussed the incurable nature of his cancer, and the overall poor prognosis, especially if he does not have good response to chemotherapy or progress on chemo -The patient understands the goal of care is  palliative. -I recommend DNR/DNI, he will think about it    Plan -Lab reviewed, adequate for treatment today. Continue FOLFOX every 2 weeks -Lab, flush, and FOLFOX in 2 and 4 weeks -Repeat PET in 3 weeks after next cycle  -I will see him again in 2 weeks  All questions were answered. The patient knows to call the clinic with any problems, questions or concerns.  I spent 25 minutes counseling the patient face to face. The total time spent in the appointment was 30 minutes and more than 50% was on counseling.  This document serves as a record of services personally performed by Truitt Merle, MD. It was created on her behalf by Arlyce Harman, a trained medical scribe. The creation of this record is based on the scribe's personal observations and the provider's statements to them. This document has been checked and approved by the attending provider.    Truitt Merle, MD 10/17/2016

## 2016-10-16 ENCOUNTER — Telehealth: Payer: Self-pay | Admitting: *Deleted

## 2016-10-16 NOTE — Telephone Encounter (Signed)
On 10-16-16 [facility], it was consult note, sim & planning note, end of tx note, follow up note

## 2016-10-17 ENCOUNTER — Other Ambulatory Visit (HOSPITAL_BASED_OUTPATIENT_CLINIC_OR_DEPARTMENT_OTHER): Payer: No Typology Code available for payment source

## 2016-10-17 ENCOUNTER — Ambulatory Visit (HOSPITAL_BASED_OUTPATIENT_CLINIC_OR_DEPARTMENT_OTHER): Payer: No Typology Code available for payment source

## 2016-10-17 ENCOUNTER — Encounter: Payer: Self-pay | Admitting: Hematology

## 2016-10-17 ENCOUNTER — Ambulatory Visit (HOSPITAL_BASED_OUTPATIENT_CLINIC_OR_DEPARTMENT_OTHER): Payer: No Typology Code available for payment source | Admitting: Hematology

## 2016-10-17 ENCOUNTER — Ambulatory Visit: Payer: No Typology Code available for payment source

## 2016-10-17 VITALS — BP 106/86 | HR 96 | Temp 98.4°F | Resp 18 | Ht 65.0 in | Wt 134.4 lb

## 2016-10-17 DIAGNOSIS — R630 Anorexia: Secondary | ICD-10-CM | POA: Diagnosis not present

## 2016-10-17 DIAGNOSIS — C7951 Secondary malignant neoplasm of bone: Secondary | ICD-10-CM

## 2016-10-17 DIAGNOSIS — I10 Essential (primary) hypertension: Secondary | ICD-10-CM | POA: Diagnosis not present

## 2016-10-17 DIAGNOSIS — C155 Malignant neoplasm of lower third of esophagus: Secondary | ICD-10-CM

## 2016-10-17 DIAGNOSIS — R634 Abnormal weight loss: Secondary | ICD-10-CM

## 2016-10-17 DIAGNOSIS — D589 Hereditary hemolytic anemia, unspecified: Secondary | ICD-10-CM | POA: Diagnosis not present

## 2016-10-17 DIAGNOSIS — J90 Pleural effusion, not elsewhere classified: Secondary | ICD-10-CM

## 2016-10-17 DIAGNOSIS — D65 Disseminated intravascular coagulation [defibrination syndrome]: Secondary | ICD-10-CM

## 2016-10-17 DIAGNOSIS — R110 Nausea: Secondary | ICD-10-CM | POA: Diagnosis not present

## 2016-10-17 DIAGNOSIS — Z5111 Encounter for antineoplastic chemotherapy: Secondary | ICD-10-CM

## 2016-10-17 DIAGNOSIS — R5383 Other fatigue: Secondary | ICD-10-CM

## 2016-10-17 DIAGNOSIS — E43 Unspecified severe protein-calorie malnutrition: Secondary | ICD-10-CM | POA: Diagnosis not present

## 2016-10-17 LAB — CBC & DIFF AND RETIC
BASO%: 0.4 % (ref 0.0–2.0)
BASOS ABS: 0 10*3/uL (ref 0.0–0.1)
EOS ABS: 0.1 10*3/uL (ref 0.0–0.5)
EOS%: 1.3 % (ref 0.0–7.0)
HEMATOCRIT: 32 % — AB (ref 38.4–49.9)
HEMOGLOBIN: 10.8 g/dL — AB (ref 13.0–17.1)
IMMATURE RETIC FRACT: 9.5 % (ref 3.00–10.60)
LYMPH#: 0.7 10*3/uL — AB (ref 0.9–3.3)
LYMPH%: 13.9 % — ABNORMAL LOW (ref 14.0–49.0)
MCH: 32.6 pg (ref 27.2–33.4)
MCHC: 33.8 g/dL (ref 32.0–36.0)
MCV: 96.7 fL (ref 79.3–98.0)
MONO#: 0.8 10*3/uL (ref 0.1–0.9)
MONO%: 14.1 % — ABNORMAL HIGH (ref 0.0–14.0)
NEUT#: 3.7 10*3/uL (ref 1.5–6.5)
NEUT%: 70.3 % (ref 39.0–75.0)
Platelets: 181 10*3/uL (ref 140–400)
RBC: 3.31 10*6/uL — ABNORMAL LOW (ref 4.20–5.82)
RDW: 18.5 % — AB (ref 11.0–14.6)
RETIC %: 4.91 % — AB (ref 0.80–1.80)
RETIC CT ABS: 162.52 10*3/uL — AB (ref 34.80–93.90)
WBC: 5.3 10*3/uL (ref 4.0–10.3)

## 2016-10-17 LAB — COMPREHENSIVE METABOLIC PANEL
ALT: 18 U/L (ref 0–55)
ANION GAP: 11 meq/L (ref 3–11)
AST: 22 U/L (ref 5–34)
Albumin: 3.2 g/dL — ABNORMAL LOW (ref 3.5–5.0)
Alkaline Phosphatase: 180 U/L — ABNORMAL HIGH (ref 40–150)
BUN: 12.7 mg/dL (ref 7.0–26.0)
CHLORIDE: 103 meq/L (ref 98–109)
CO2: 24 meq/L (ref 22–29)
Calcium: 9.4 mg/dL (ref 8.4–10.4)
Creatinine: 0.6 mg/dL — ABNORMAL LOW (ref 0.7–1.3)
Glucose: 127 mg/dl (ref 70–140)
Potassium: 4.4 mEq/L (ref 3.5–5.1)
SODIUM: 137 meq/L (ref 136–145)
Total Bilirubin: 0.41 mg/dL (ref 0.20–1.20)
Total Protein: 6.3 g/dL — ABNORMAL LOW (ref 6.4–8.3)

## 2016-10-17 LAB — CEA (IN HOUSE-CHCC): CEA (CHCC-In House): 86.66 ng/mL — ABNORMAL HIGH (ref 0.00–5.00)

## 2016-10-17 MED ORDER — PALONOSETRON HCL INJECTION 0.25 MG/5ML
0.2500 mg | Freq: Once | INTRAVENOUS | Status: AC
  Administered 2016-10-17: 0.25 mg via INTRAVENOUS

## 2016-10-17 MED ORDER — DEXAMETHASONE SODIUM PHOSPHATE 10 MG/ML IJ SOLN
10.0000 mg | Freq: Once | INTRAMUSCULAR | Status: AC
  Administered 2016-10-17: 10 mg via INTRAVENOUS

## 2016-10-17 MED ORDER — OXALIPLATIN CHEMO INJECTION 100 MG/20ML
60.0000 mg/m2 | Freq: Once | INTRAVENOUS | Status: AC
  Administered 2016-10-17: 100 mg via INTRAVENOUS
  Filled 2016-10-17: qty 20

## 2016-10-17 MED ORDER — DEXTROSE 5 % IV SOLN
Freq: Once | INTRAVENOUS | Status: AC
  Administered 2016-10-17: 13:00:00 via INTRAVENOUS

## 2016-10-17 MED ORDER — DEXAMETHASONE SODIUM PHOSPHATE 10 MG/ML IJ SOLN
INTRAMUSCULAR | Status: AC
  Filled 2016-10-17: qty 1

## 2016-10-17 MED ORDER — LEUCOVORIN CALCIUM INJECTION 350 MG
400.0000 mg/m2 | Freq: Once | INTRAMUSCULAR | Status: AC
  Administered 2016-10-17: 656 mg via INTRAVENOUS
  Filled 2016-10-17: qty 32.8

## 2016-10-17 MED ORDER — SODIUM CHLORIDE 0.9% FLUSH
10.0000 mL | INTRAVENOUS | Status: DC | PRN
  Administered 2016-10-17: 10 mL via INTRAVENOUS
  Filled 2016-10-17: qty 10

## 2016-10-17 MED ORDER — PALONOSETRON HCL INJECTION 0.25 MG/5ML
INTRAVENOUS | Status: AC
  Filled 2016-10-17: qty 5

## 2016-10-17 MED ORDER — SODIUM CHLORIDE 0.9 % IV SOLN
2000.0000 mg/m2 | INTRAVENOUS | Status: DC
  Administered 2016-10-17: 3300 mg via INTRAVENOUS
  Filled 2016-10-17: qty 66

## 2016-10-17 NOTE — Progress Notes (Signed)
Port flushed and presented positive blood return before connecting 5 FU pump.

## 2016-10-17 NOTE — Patient Instructions (Signed)
[facility] Discharge Instructions for Patients Receiving Chemotherapy  Today you received the following chemotherapy agents 5 FU/Oxaliplatin/Leucovorin To help prevent nausea and vomiting after your treatment, we encourage you to take your nausea medication as prescribed.   If you develop nausea and vomiting that is not controlled by your nausea medication, call the clinic.   BELOW ARE SYMPTOMS THAT SHOULD BE REPORTED IMMEDIATELY:  *FEVER GREATER THAN 100.5 F  *CHILLS WITH OR WITHOUT FEVER  NAUSEA AND VOMITING THAT IS NOT CONTROLLED WITH YOUR NAUSEA MEDICATION  *UNUSUAL SHORTNESS OF BREATH  *UNUSUAL BRUISING OR BLEEDING  TENDERNESS IN MOUTH AND THROAT WITH OR WITHOUT PRESENCE OF ULCERS  *URINARY PROBLEMS  *BOWEL PROBLEMS  UNUSUAL RASH Items with * indicate a potential emergency and should be followed up as soon as possible.  Feel free to call the clinic you have any questions or concerns. The clinic phone number is [phone].  Please show the [facility] at check-in to the Emergency Department and triage nurse.

## 2016-10-19 ENCOUNTER — Encounter (HOSPITAL_COMMUNITY): Payer: Self-pay

## 2016-10-19 ENCOUNTER — Ambulatory Visit (HOSPITAL_BASED_OUTPATIENT_CLINIC_OR_DEPARTMENT_OTHER): Payer: Medicare Other

## 2016-10-19 VITALS — BP 114/63 | HR 93 | Temp 99.0°F | Resp 18

## 2016-10-19 DIAGNOSIS — C155 Malignant neoplasm of lower third of esophagus: Secondary | ICD-10-CM

## 2016-10-19 DIAGNOSIS — Z452 Encounter for adjustment and management of vascular access device: Secondary | ICD-10-CM | POA: Diagnosis not present

## 2016-10-19 MED ORDER — HEPARIN SOD (PORK) LOCK FLUSH 100 UNIT/ML IV SOLN
500.0000 [IU] | Freq: Once | INTRAVENOUS | Status: AC | PRN
  Administered 2016-10-19: 500 [IU]
  Filled 2016-10-19: qty 5

## 2016-10-19 MED ORDER — SODIUM CHLORIDE 0.9% FLUSH
10.0000 mL | INTRAVENOUS | Status: DC | PRN
  Administered 2016-10-19: 10 mL
  Filled 2016-10-19: qty 10

## 2016-10-19 NOTE — Patient Instructions (Signed)
Implanted Port Home Guide An implanted port is a type of central line that is placed under the skin. Central lines are used to provide IV access when treatment or nutrition needs to be given through a person's veins. Implanted ports are used for long-term IV access. An implanted port may be placed because:  You need IV medicine that would be irritating to the small veins in your hands or arms.  You need long-term IV medicines, such as antibiotics.  You need IV nutrition for a long period.  You need frequent blood draws for lab tests.  You need dialysis.  Implanted ports are usually placed in the chest area, but they can also be placed in the upper arm, the abdomen, or the leg. An implanted port has two main parts:  Reservoir. The reservoir is round and will appear as a small, raised area under your skin. The reservoir is the part where a needle is inserted to give medicines or draw blood.  Catheter. The catheter is a thin, flexible tube that extends from the reservoir. The catheter is placed into a large vein. Medicine that is inserted into the reservoir goes into the catheter and then into the vein.  How will I care for my incision site? Do not get the incision site wet. Bathe or shower as directed by your health care provider. How is my port accessed? Special steps must be taken to access the port:  Before the port is accessed, a numbing cream can be placed on the skin. This helps numb the skin over the port site.  Your health care provider uses a sterile technique to access the port. ? Your health care provider must put on a mask and sterile gloves. ? The skin over your port is cleaned carefully with an antiseptic and allowed to dry. ? The port is gently pinched between sterile gloves, and a needle is inserted into the port.  Only "non-coring" port needles should be used to access the port. Once the port is accessed, a blood return should be checked. This helps ensure that the port  is in the vein and is not clogged.  If your port needs to remain accessed for a constant infusion, a clear (transparent) bandage will be placed over the needle site. The bandage and needle will need to be changed every week, or as directed by your health care provider.  Keep the bandage covering the needle clean and dry. Do not get it wet. Follow your health care provider's instructions on how to take a shower or bath while the port is accessed.  If your port does not need to stay accessed, no bandage is needed over the port.  What is flushing? Flushing helps keep the port from getting clogged. Follow your health care provider's instructions on how and when to flush the port. Ports are usually flushed with saline solution or a medicine called heparin. The need for flushing will depend on how the port is used.  If the port is used for intermittent medicines or blood draws, the port will need to be flushed: ? After medicines have been given. ? After blood has been drawn. ? As part of routine maintenance.  If a constant infusion is running, the port may not need to be flushed.  How long will my port stay implanted? The port can stay in for as long as your health care provider thinks it is needed. When it is time for the port to come out, surgery will be   done to remove it. The procedure is similar to the one performed when the port was put in. When should I seek immediate medical care? When you have an implanted port, you should seek immediate medical care if:  You notice a bad smell coming from the incision site.  You have swelling, redness, or drainage at the incision site.  You have more swelling or pain at the port site or the surrounding area.  You have a fever that is not controlled with medicine.  This information is not intended to replace advice given to you by your health care provider. Make sure you discuss any questions you have with your health care provider. Document  Released: 07/09/2005 Document Revised: 12/15/2015 Document Reviewed: 03/16/2013 [employer] Interactive Patient Education  2017 [employer]  

## 2016-10-25 ENCOUNTER — Telehealth: Payer: Self-pay | Admitting: Hematology

## 2016-10-25 NOTE — Telephone Encounter (Signed)
MAILED PT RECORDS TO [facility] @ [facility]

## 2016-10-30 NOTE — Progress Notes (Signed)
We will continue as is  [facility]  Telephone:([phone] Fax:([phone]) [phone]  Clinic Follow up Note   Patient Care Team: [name], MD as PCP - General (Internal Medicine) Truitt Merle, MD as Consulting Physician (Hematology) Kyung Rudd, MD as Consulting Physician (Radiation Oncology) [name], MD as Consulting Physician (Cardiothoracic Surgery) Karie Mainland, RD as Dietitian (Nutrition) Minus Breeding, MD as Consulting Physician (Cardiology)   CHIEF COMPLAINTS:  Follow up metastatic esophageal cancer    Oncology History   Presented with solid dysphagia at least daily with chest pain. Involuntary weight loss of 10-12 lb over past year  Malignant neoplasm of lower third of esophagus (HCC)   Staging form: Esophagus - Adenocarcinoma, AJCC 7th Edition   - Clinical stage from 12/15/2015: Stage IIIA (T3, N1, M0) - Signed by Truitt Merle, MD on 12/24/2015   - Pathologic stage from 04/26/2016: yT0, N0, cM0, GX - Signed by [name], MD on 04/27/2016      Malignant neoplasm of lower third of esophagus ([place])   11/23/2015 Procedure    EGD per [facility](Dr. Toledo):6cm mass in lower third of esophagus      11/24/2015 Initial Diagnosis    Esophageal cancer ([place])      11/24/2015 Pathology Results    Mucinous adenocarcinoma, moderately differentiated-Her2 analysis pending      12/05/2015 Imaging    PET scan showed hypermetabolic a mass in distal esophagus, hypermetabolic activity in the left anterior prostate gland, metabolic density in left adrenal nodule, CT attenuation suggest a benign adrenal adenoma. Left apical 2 mm lung nodule.      12/15/2015 Procedure    EUS showed a uT3N1 lesion from 27cm to 35cm from the incisors       12/26/2015 - 02/02/2016 Chemotherapy    Weekly carboplatin AUC 2 and Taxol 45 mg/m, with concurrent radiation      12/26/2015 - 02/02/2016 Radiation Therapy    Neoadjuvant chemoradiation to the esophageal cancer        04/23/2016 Surgery    total esophagectomy with cervical esophagogastrostomy, pyloroplasty, feeding jejunostomy       04/23/2016 Pathology Results    Esophagectomy showed no residual tumor, 8 lymph nodes were all negative, incidental leiomyoma 0.2 cm      08/14/2016 PET scan    IMPRESSION: 1. Small hypermetabolic left adrenal mass, this has not changed in size compared to 12/01/15 but has a maximum standard uptake value of 6.2 (previously 4.2). Indeterminate density and MRI characteristics, not specific for adenoma. Early metastatic disease is not readily excluded although the lack of change of size is somewhat reassuring. Surveillance recommended. 2. No other hypermetabolic lesions are identified. 3. Moderate to large left pleural effusion with passive atelectasis. 4. Esophagectomy with gastric pull-through. 5. No hypermetabolic liver lesions are seen. 6. Enlarged prostate gland.      08/16/2016 - 08/23/2016 Hospital Admission    He was diagnosed with DIC, unclear etiology. He was given blood products. He underwent echocardiogram which was negative for endocarditis, CTA chest/abd which was negative for aneurysm or dissection as work up for DIC. He was on prednisone for some time, but stopped as it was not helping much for DIC.       08/28/2016 - 09/01/2016 Hospital Admission    He has had issues with recurrent L sided pleural effusions, DIC and melena. He was last admitted in 1/25, given supportive care with a thoracentesis and blood transfusion. Dr Burr Medico has been following DIC and  has requested a direct admit for melena, anemia and thrombocytopenia. GI called for EGD.        08/31/2016 Pathology Results    Bone Marrow Biopsy, right iliac - METASTATIC ADENOCARCINOMA. - SEE COMMENT. PERIPHERAL BLOOD: - NORMOCYTIC ANEMIA. - THROMBOCYTOPENIA.      08/31/2016 Progression    Bone marrow biopsy showed metastatic adenocarcinoma      09/04/2016 -  Chemotherapy    FOLFOX every 2 weeks        09/19/2016 Procedure    Therapeutic thoracentesis of a left pleural effusion performed on 09/19/16 yielded 1.5 liters of blood-tinged fluid. Reactive appearing mesothelial cells were present.       HISTORY OF PRESENTING ILLNESS (12/14/2015):  Fred [name] 76 y.o. male is here because of His newly diagnosed esophageal cancer. He is accompanied by his wife and daughter to my clinic today.  He has had dysphagia for 2 months, mainly with solid food, no dysphagia with soft food or liquid. He also has mild pain in mid chest when he swallows. No nausea, abdominal pain, or other discomfort. He has lost about 15 lbs in the last year, no other symoptoms. He was seen by his primary care physician at [facility], and referred to gastroenterologist Dr. Alice Reichert. He underwent EGD on 11/23/2015, which showed a 6 cm mass in the lower third of esophagus. Biopsy showed adenocarcinoma, moderately differentiated. He was referred to [place] to consider neoadjuvant therapy. He is scheduled to see a Psychologist, sport and exercise at [facility] later this week.  He has had some urinary frequency, underwent TURP on 11/02/2015.   CURRENT THERAPY: first line chemo FOLFOX every 2 weeks started on 09/04/2016  INTERIM HISTORY: Mr. Deman returns for follow up and 5th cycle chemo with his wife. He was seen in the infusion room. The patient states he feels well. He reports feeling short of breath. He dose 12 feedings a day via feeding tube. He eats by mouth as well and is gradually increasing the amount. He reports the area around his feeding tube has not changed, with drainage.   MEDICAL HISTORY:  Past Medical History:  Diagnosis Date  . Esophageal cancer ([place]) 11/23/15   lower 3rd esohagus   . GERD (gastroesophageal reflux disease)   . Hyperlipidemia   . Hypertension     SURGICAL HISTORY: Past Surgical History:  Procedure Laterality Date  . CHEST TUBE INSERTION Left 04/23/2016   Procedure: CHEST TUBE INSERTION;  Surgeon: [name], MD;   Location: [place];  Service: Thoracic;  Laterality: Left;  . COLONOSCOPY N/A 08/30/2016   Procedure: COLONOSCOPY;  Surgeon: Milus Banister, MD;  Location: [facility];  Service: Endoscopy;  Laterality: N/A;  . COMPLETE ESOPHAGECTOMY N/A 04/23/2016   Procedure: TRANSHIATIAL TOTAL ESOPHAGECTOMY COMPLETE; CERVICAL ESOPHAGOGASTROSTOMY AND PYLOROPLASTY;  Surgeon: [name], MD;  Location: [place];  Service: Thoracic;  Laterality: N/A;  . cystoscope  4/12/1   prostate  . ERCP    . ESOPHAGOGASTRODUODENOSCOPY (EGD) WITH PROPOFOL N/A 08/29/2016   Procedure: ESOPHAGOGASTRODUODENOSCOPY (EGD) WITH PROPOFOL;  Surgeon: Milus Banister, MD;  Location: [facility];  Service: Endoscopy;  Laterality: N/A;  . INGUINAL HERNIA REPAIR    . IR GENERIC HISTORICAL  08/31/2016   IR FLUORO GUIDE PORT INSERTION RIGHT 08/31/2016 Corrie Mckusick, DO [facility]-INTER[facility]  . IR GENERIC HISTORICAL  08/31/2016   IR US GUIDE VASC ACCESS RIGHT 08/31/2016 Corrie Mckusick, DO [facility]  . JEJUNOSTOMY N/A 04/23/2016   Procedure: FEEDING JEJUNOSTOMY;  Surgeon: Lilia Argue  Servando Snare, MD;  Location: [place];  Service: Thoracic;  Laterality: N/A;  . LEG SURGERY Left    hole  . PROSTATE BIOPSY  10/05/15  . right shoulder surgery    . VIDEO BRONCHOSCOPY N/A 04/23/2016   Procedure: VIDEO BRONCHOSCOPY;  Surgeon: [name], MD;  Location: [facility] OR;  Service: Thoracic;  Laterality: N/A;    SOCIAL HISTORY: Social History   Social History  . Marital status: Married    Spouse name: N/A  . Number of children: 1  . Years of education: N/A   Occupational History  . Truck Geophysicist/field seismologist    Social History Main Topics  . Smoking status: Former Smoker    Packs/day: 1.00    Years: 10.00    Quit date: 07/24/1967  . Smokeless tobacco: Never Used  . Alcohol use No  . Drug use: No  . Sexual activity: Not Currently   Other Topics Concern  . Not on file   Social History Narrative  . No narrative on file    FAMILY HISTORY: Family History  Problem  Relation Age of Onset  . Cancer Maternal Grandfather 69    gastric cancer   . Alzheimer's disease Mother   . Diabetes Mother   . Stroke Father 25  . Multiple sclerosis Sister     ALLERGIES:  is allergic to no known allergies.  MEDICATIONS:  Current Outpatient Prescriptions  Medication Sig Dispense Refill  . acetaminophen (TYLENOL) 325 MG tablet Take 650 mg by mouth every 6 (six) hours as needed.    Marland Kitchen atenolol (TENORMIN) 25 MG tablet Take 1 tab in the morning and half tab in the evening (Patient taking differently: Take 12.5-25 mg by mouth 2 (two) times daily. Take 25m in the morning and 12.541min the evening) 60 tablet 1  . diphenhydrAMINE (BENADRYL) 25 MG tablet Take 25 mg by mouth every 6 (six) hours as needed.    . folic acid (FOLVITE) 1 MG tablet Take 1 tablet (1 mg total) by mouth daily. 30 tablet 0  . lidocaine-prilocaine (EMLA) cream Apply 1 application topically as needed. Apply 1-2 tsp over port site 1 hour prior to chemotherapy 30 g 1  . mirtazapine (REMERON) 15 MG tablet Take 1 tablet (15 mg total) by mouth at bedtime. (Patient not taking: Reported on 10/17/2016) 30 tablet 2  . Nutritional Supplements (FEEDING SUPPLEMENT, OSMOLITE 1.5 CAL,) LIQD Increase osmolite 1.5 to 6 cans to be infused via continuous pump for 19 hours per day. Flush with 18078mf water before and after continuous feeding.  Give an additional 180m80m water via tube.  Drink an additional 600ml57mwater during the day.  Send formula and bags. 1422 mL 0  . ondansetron (ZOFRAN ODT) 8 MG disintegrating tablet Take 1 tablet (8 mg total) by mouth every 8 (eight) hours as needed for nausea or vomiting. 30 tablet 2  . oxycodone (OXY-IR) 5 MG capsule Take 5 mg by mouth every 6 (six) hours as needed for pain.     . proMarland Kitchenhlorperazine (COMPAZINE) 10 MG tablet Take 1 tablet (10 mg total) by mouth every 6 (six) hours as needed for nausea or vomiting. 30 tablet 3  . vitamin B-12 1000 MCG tablet Take 1 tablet (1,000 mcg total)  by mouth daily. 30 tablet 0  . Water For Irrigation, Sterile (FREE WATER) SOLN Place 220 mLs into feeding tube 4 (four) times daily.     No current facility-administered medications for this visit.     REVIEW  OF SYSTEMS:   Constitutional: Denies fevers, chills or abnormal night sweats Eyes: Denies blurriness of vision, double vision or watery eyes Ears, nose, mouth, throat, and face: Denies mucositis or sore throat Respiratory: Denies cough, dyspnea or wheezes Cardiovascular: Denies palpitation, chest discomfort or lower extremity swelling Gastrointestinal:  Denies heartburn or change in bowel habits (+) Drainage around J-tube. Skin: Denies abnormal skin rashes Lymphatics: Denies new lymphadenopathy or easy bruising Neurological:Denies numbness, tingling or new weaknesses Behavioral/Psych: Mood is stable, no new changes  Musculoskeletal: (+) L hip pain All other systems were reviewed with the patient and are negative.  PHYSICAL EXAMINATION: ECOG PERFORMANCE STATUS: 2 The pressure 119/70, heart rate 92, respiratory rate 16, pulse ox 98% on room air GENERAL:alert, no distress and comfortable, in wheelchair SKIN: Appears to be pale, skin texture, turgor are normal, no rashes or significant lesions EYES: normal, conjunctiva are pink and non-injected, sclera clear OROPHARYNX:no exudate, no erythema and lips, buccal mucosa, and tongue normal  NECK: supple, thyroid normal size, non-tender, without nodularity LYMPH:  no palpable lymphadenopathy in the cervical, axillary or inguinal LUNGS: clear to auscultation and percussion with normal breathing effort (+) except decreased breath sound at the left lung base. HEART: regular rate & rhythm and no murmurs and no lower extremity edema ABDOMEN:abdomen soft, non-tender and normal bowel sounds, J tube in place. No skin erythema or pus. (+) Hepatomegaly, liver is palpable 2-3 cm below the rib cage, nontender. (+) Feeding tube site with small bleeding  around feeding tube site, minimal amount of yellowish secretion. No skin erythema around J tube. Musculoskeletal:no cyanosis of digits and no clubbing  PSYCH: alert & oriented x 3 with fluent speech NEURO: no focal motor/sensory deficits  LABORATORY DATA:  I have reviewed the data as listed CBC Latest Ref Rng & Units 10/31/2016 10/17/2016 10/02/2016  WBC 4.0 - 10.3 10e3/uL 5.2 5.3 4.3  Hemoglobin 13.0 - 17.1 g/dL 12.1(L) 10.8(L) 10.0(L)  Hematocrit 38.4 - 49.9 % 35.6(L) 32.0(L) 29.6(L)  Platelets 140 - 400 10e3/uL 188 181 289   CMP Latest Ref Rng & Units 10/31/2016 10/17/2016 10/02/2016  Glucose 70 - 140 mg/dl 110 127 112  BUN 7.0 - 26.0 mg/dL 11.4 12.7 12.8  Creatinine 0.7 - 1.3 mg/dL 0.6(L) 0.6(L) 0.6(L)  Sodium 136 - 145 mEq/L 138 137 137  Potassium 3.5 - 5.1 mEq/L 4.4 4.4 4.3  Chloride 101 - 111 mmol/L - - -  CO2 22 - 29 mEq/L 24 24 25   Calcium 8.4 - 10.4 mg/dL 9.4 9.4 9.3  Total Protein 6.4 - 8.3 g/dL 6.5 6.3(L) 6.5  Total Bilirubin 0.20 - 1.20 mg/dL 0.40 0.41 0.40  Alkaline Phos 40 - 150 U/L 160(H) 180(H) 284(H)  AST 5 - 34 U/L 26 22 41(H)  ALT 0 - 55 U/L 15 18 38   Results for LINCON, SAHLIN (MRN [id number]) as of 10/30/2016 17:23  Ref. Range 08/09/2016 13:04 09/07/2016 10:56 10/02/2016 09:17 10/17/2016 11:15  CEA Latest Ref Range: 0.0 - 4.7 ng/mL 2,382.0 (H) 4,056.0 (H)    CEA ([facility]) Latest Ref Range: 0.00 - 5.00 ng/mL 1,731.10 x 2 dilu... (H) 2,612.34 Result C... (H) 397.90 (H) 86.66 (H)    Pathology report [facility] TESTING 09/29/2016   PD-L1 Testing 09/29/2016   Diagnosis 09/27/2016 Consult- Comprehensive, [id number] - 1A Mass, Lower third of Esophagus, Mucosal Biopsy - ADENOCARCINOMA WITH EXTRACELLULAR MUCIN. - SEE COMMENT. Microscopic Comment Provided Her2 IHC with the appropriate controls is negative. Per report Her2 FISH is also negative. There  is limited tissue remaining, but Foundation One and PDL-1 testing will be attempted as requested.  Diagnosis  09/19/2016 PLEURAL FLUID, LEFT (SPECIMEN 1 OF 1 COLLECTED 09/19/16): REACTIVE APPEARING MESOTHELIAL CELLS PRESENT.  Diagnosis 08/31/2016 Bone Marrow Biopsy, right iliac - METASTATIC ADENOCARCINOMA. - SEE COMMENT. PERIPHERAL BLOOD: - NORMOCYTIC ANEMIA. - THROMBOCYTOPENIA.  Diagnosis 08/02/16 PLEURAL FLUID, LEFT (SPECIMEN 1 OF 1 COLLECTED 08/02/16): REACTIVE MESOTHELIAL CELLS PRESENT. LYMPHOCYTES PRESENT.  Diagnosis 11/23/2015 Mass, lower third of the esophagus, mucosal biopsy Mucinous adenocarcinoma, moderately differentiated.  Diagnosis 04/23/2016 1. Lymph node, biopsy, Cervical - ONE OF ONE LYMPH NODES NEGATIVE FOR CARCINOMA (0/1). 2. Lymph node, biopsy, Periesophageal - BLOOD WITH SCANT BENIGN SOFT TISSUE AND SKELETAL MUSCLE. - NO MALIGNANCY IDENTIFIED. 3. Esophagogastrectomy - ULCERATION WITH INFLAMMATION AND REACTIVE CHANGES. - FOCAL INTESTINAL METAPLASIA. - CHANGES CONSISTENT WITH TREATMENT EFFECT. - SEVEN OF SEVEN LYMPH NODES NEGATIVE FOR CARCINOMA (0/7). - INCIDENTAL LEIOMYOMA, 0.2 CM. - MARGINS ARE NEGATIVE FOR DYSPLASIA OR MALIGNANCY. - SEE ONCOLOGY TABLE. Microscopic Comment 3. ESOPHAGUS: Specimen: Esophagus and proximal stomach, cervical lymph node. Procedure: Esophagogastrectomy and cervical lymph node biopsy. Tumor Site: Presumed GE junction, see comment. Relationship of Tumor to esophagogastric junction: Can not be assessed. Distance of tumor center from esophagogastric junction: Can not be assessed. Tumor Size Greatest dimension: No residual tumor present. Histologic Type: Per medical record adenocarcinoma (no biopsy for review). Histologic Grade: N/A. Microscopic Tumor Extension: N/A. Margins: Negative. Treatment Effect: Present (TRS 0). Lymph-Vascular Invasion: Not identified. Perineural Invasion: Not identified. Lymph nodes: number examined 8; number positive: 0 TNM: ypT0, ypN0 see comment. 1 of 3 FINAL for KERVIN, BONES ([id number]) Microscopic  Comment(continued) Ancillary studies: None. Comments: The entire GE junction is submitted for evaluation. There is ulceration with associated inflammation. There is acellular mucin/myxoid changes which extend through the muscularis propria, but no viable/residual tumor is identified and thus the stage is ypT0. There is focal background intestinal metaplasia, which may have been pre-existing (Barrett's esophagus) or related to therapy.   RADIOGRAPHIC STUDIES: I have personally reviewed the radiological images as listed and agreed with the findings in the report. Dg Chest 2 View  Result Date: 10/31/2016 CLINICAL DATA:  Left pleural effusion.  Esophageal carcinoma. EXAM: CHEST  2 VIEW COMPARISON:  09/19/2016 FINDINGS: Moderate left pleural effusion has increased in size since previous study, with associated left lower lung atelectasis. Right lung is clear. Heart size is within normal limits. Right-sided Port-A-Cath remains in appropriate position. IMPRESSION: Increased moderate left pleural effusion and left lower lobe atelectasis. Electronically Signed   By: Earle Gell M.D.   On: 10/31/2016 20:10   PET 08/14/2016 IMPRESSION: 1. Small hypermetabolic left adrenal mass, this has not changed in size compared to 12/01/15 but has a maximum standard uptake value of 6.2 (previously 4.2). Indeterminate density and MRI characteristics, not specific for adenoma. Early metastatic disease is not readily excluded although the lack of change of size is somewhat reassuring. Surveillance recommended. 2. No other hypermetabolic lesions are identified. 3. Moderate to large left pleural effusion with passive atelectasis. 4. Esophagectomy with gastric pull-through. 5. No hypermetabolic liver lesions are seen. 6. Enlarged prostate gland.  CT angio chest/abdomen/pelvis for dissection w/wo contrast 08/22/2016 IMPRESSION: No evidence of aortic aneurysm or dissection. No evidence of pulmonary embolus. Heart is  borderline in size. Stable small left adrenal nodule which was hypermetabolic on prior PET CT. Stable large left pleural effusion with compressive atelectasis in the left lower lobe. Stable appearance of the gastric pull-through post esophagectomy.  US  Thoracentesis asp pleural space 09/19/2016 IMPRESSION: Successful ultrasound guided diagnostic and therapeutic left thoracentesis yielding 1.5 liters of pleural fluid.  ASSESSMENT & PLAN:  76 y.o. male presented with dysphagia with solid food  1. Distal esophageal adenocarcinoma, uT3N1M0, stage IIIA, ypT0N0M0, diffuse bone metastasis 08/2016 -I previously reviewed his CT scan, PET scan, EGD, and the biopsy findings with patient and his family member in details. -The initial PET scan showed no definitive distant metastasis. 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 This will be followed in the future scan. -By EUS, he had T3 N1 stage III disease. -He completed neoadjuvant radiation with weekly Carbo and Taxol. -I previously reviewed his surgical pathology findings, he has had complete pathologic response to neoadjuvant chemotherapy and radiation, which predicts good prognosis -Unfortunately he developed diffuse bone metastasis in January 2018, confirmed by bone marrow biopsy -He has started first-line chemotherapy FOLFOX, tolerating well, and his anemia and some cytopenia has improved since chemotherapy -The goal of therapy is palliative -I have requested HER2 IHC test but was not sufficient tissue -his tumor was negative for PD-L1 expression, he is not a candidate for immunotherapy -I reviewed his Foundation One genomic testing results, unfortunately there is no good targeted therapy available. His tumor has MSI stable -He is clinically doing well overall, has required blood transfusion (2 PRBCs) on 09/28/16 since he started chemotherapy, no significant noticeable side effects from  chemotherapy, he has gained some weight. We will continue chemotherapy every 2 weeks. -We discussed his clinical response may be difficult to evaluate since his PET scan was negative when he had a bone metastasis, we'll based on the blood counts, and CEA level, also repeat PET scan every 3 months to evaluate his response. -CEA has decreased significant since starting FOLFOX in February 2018. -Plan to repeat PET after cycle 5 or 6   2. 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 thrombocytopenia has resolved. -The patient had many blood transfusions with the last on 09/28/16, he has not required any blood transfusion since he started chemotherapy. -Continue monitoring CBC  3. 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  -He has had 3 repeated thoracentesis, all cytology were negative, -Therapeutic thoracentesis performed on 09/19/16 yielded 1.5 liters of blood-tinged fluid. Reactive appearing mesothelial cells were present. -Continue monitoring, will repeat CXR today -He is scheduled to follow-up with Dr. Pia Mau tomorrow  4. HTN -Continue medication and follow-up of his primary care physician -his PCP at [facility] has changed his BP meds lately   5. Malnutrition, nausea/vomiting, and weight loss -Doing better, he is tolerating J-tube feeding very well, he eats normally. Encouraged the patient to eat small frequent meals. -12 feedings daily -Follow up with dietitian -Feeding tube site has mild bleeding. A picture was previously taken and sent to surgeon Dr. Servando Snare  6. Anorexia and depression  -continue mirtazapine. Overall improved lately  -He hasn't talked to the nutritionist in a while, he will follow up   7. Fatigue -He would like something to help his energy. -We discussed steroids to help for this, but I told them that this is not a long term solution -He would not like  steroids at this time.   8. Goal of care discussion  -We previously discussed the incurable nature of his cancer, and the overall poor prognosis, especially if he does not have good response to chemotherapy or progress on chemo -  The patient understands the goal of care is palliative. -I recommend DNR/DNI, he will think about it    Plan -Lab reviewed, adequate for treatment today. Continue FOLFOX every 2 weeks. This is cycle 5. -PET scan in 3 weeks. -Fax medical records to the [place]. -Lab, flush, and FOLFOX in 2 and 4 weeks -Chest X-ray today. -F/u in 4 weeks  -He is scheduled to see Dr. Servando Snare on 11/01/16.  All questions were answered. The patient knows to call the clinic with any problems, questions or concerns.  I spent 25 minutes counseling the patient face to face. The total time spent in the appointment was 30 minutes and more than 50% was on counseling.   Truitt Merle, MD 10/31/2016   This document serves as a record of services personally performed by Truitt Merle, MD. It was created on her behalf by Darcus Austin, a trained medical scribe. The creation of this record is based on the scribe's personal observations and the provider's statements to them. This document has been checked and approved by the attending provider.

## 2016-10-31 ENCOUNTER — Other Ambulatory Visit: Payer: Self-pay | Admitting: Hematology

## 2016-10-31 ENCOUNTER — Ambulatory Visit (HOSPITAL_COMMUNITY)
Admission: RE | Admit: 2016-10-31 | Discharge: 2016-10-31 | Disposition: A | Discharge status: Home / Self Care | Payer: No Typology Code available for payment source | Source: Ambulatory Visit | Attending: Hematology | Admitting: Hematology

## 2016-10-31 ENCOUNTER — Ambulatory Visit (HOSPITAL_BASED_OUTPATIENT_CLINIC_OR_DEPARTMENT_OTHER): Payer: No Typology Code available for payment source | Admitting: Hematology

## 2016-10-31 ENCOUNTER — Ambulatory Visit: Payer: No Typology Code available for payment source

## 2016-10-31 ENCOUNTER — Other Ambulatory Visit (HOSPITAL_BASED_OUTPATIENT_CLINIC_OR_DEPARTMENT_OTHER): Payer: No Typology Code available for payment source

## 2016-10-31 ENCOUNTER — Encounter (HOSPITAL_COMMUNITY): Payer: Self-pay

## 2016-10-31 ENCOUNTER — Telehealth: Payer: Self-pay | Admitting: Hematology

## 2016-10-31 ENCOUNTER — Ambulatory Visit (HOSPITAL_BASED_OUTPATIENT_CLINIC_OR_DEPARTMENT_OTHER): Payer: No Typology Code available for payment source

## 2016-10-31 DIAGNOSIS — C155 Malignant neoplasm of lower third of esophagus: Secondary | ICD-10-CM

## 2016-10-31 DIAGNOSIS — C7951 Secondary malignant neoplasm of bone: Secondary | ICD-10-CM

## 2016-10-31 DIAGNOSIS — J9811 Atelectasis: Secondary | ICD-10-CM | POA: Insufficient documentation

## 2016-10-31 DIAGNOSIS — J90 Pleural effusion, not elsewhere classified: Secondary | ICD-10-CM | POA: Insufficient documentation

## 2016-10-31 DIAGNOSIS — Z5111 Encounter for antineoplastic chemotherapy: Secondary | ICD-10-CM

## 2016-10-31 DIAGNOSIS — E43 Unspecified severe protein-calorie malnutrition: Secondary | ICD-10-CM

## 2016-10-31 DIAGNOSIS — I10 Essential (primary) hypertension: Secondary | ICD-10-CM

## 2016-10-31 LAB — COMPREHENSIVE METABOLIC PANEL
ALT: 15 U/L (ref 0–55)
ANION GAP: 11 meq/L (ref 3–11)
AST: 26 U/L (ref 5–34)
Albumin: 3.3 g/dL — ABNORMAL LOW (ref 3.5–5.0)
Alkaline Phosphatase: 160 U/L — ABNORMAL HIGH (ref 40–150)
BUN: 11.4 mg/dL (ref 7.0–26.0)
CALCIUM: 9.4 mg/dL (ref 8.4–10.4)
CHLORIDE: 102 meq/L (ref 98–109)
CO2: 24 meq/L (ref 22–29)
Creatinine: 0.6 mg/dL — ABNORMAL LOW (ref 0.7–1.3)
Glucose: 110 mg/dl (ref 70–140)
POTASSIUM: 4.4 meq/L (ref 3.5–5.1)
Sodium: 138 mEq/L (ref 136–145)
Total Bilirubin: 0.4 mg/dL (ref 0.20–1.20)
Total Protein: 6.5 g/dL (ref 6.4–8.3)

## 2016-10-31 LAB — CBC & DIFF AND RETIC
BASO%: 0.2 % (ref 0.0–2.0)
BASOS ABS: 0 10*3/uL (ref 0.0–0.1)
EOS ABS: 0.2 10*3/uL (ref 0.0–0.5)
EOS%: 2.9 % (ref 0.0–7.0)
HCT: 35.6 % — ABNORMAL LOW (ref 38.4–49.9)
HEMOGLOBIN: 12.1 g/dL — AB (ref 13.0–17.1)
IMMATURE RETIC FRACT: 8.1 % (ref 3.00–10.60)
LYMPH#: 0.9 10*3/uL (ref 0.9–3.3)
LYMPH%: 18.1 % (ref 14.0–49.0)
MCH: 32.9 pg (ref 27.2–33.4)
MCHC: 34 g/dL (ref 32.0–36.0)
MCV: 96.7 fL (ref 79.3–98.0)
MONO#: 0.7 10*3/uL (ref 0.1–0.9)
MONO%: 14.1 % — AB (ref 0.0–14.0)
NEUT%: 64.7 % (ref 39.0–75.0)
NEUTROS ABS: 3.4 10*3/uL (ref 1.5–6.5)
Platelets: 188 10*3/uL (ref 140–400)
RBC: 3.68 10*6/uL — ABNORMAL LOW (ref 4.20–5.82)
RDW: 16.6 % — AB (ref 11.0–14.6)
RETIC %: 3.01 % — AB (ref 0.80–1.80)
RETIC CT ABS: 110.77 10*3/uL — AB (ref 34.80–93.90)
WBC: 5.2 10*3/uL (ref 4.0–10.3)

## 2016-10-31 MED ORDER — PALONOSETRON HCL INJECTION 0.25 MG/5ML
0.2500 mg | Freq: Once | INTRAVENOUS | Status: AC
  Administered 2016-10-31: 0.25 mg via INTRAVENOUS

## 2016-10-31 MED ORDER — SODIUM CHLORIDE 0.9 % IV SOLN
2000.0000 mg/m2 | INTRAVENOUS | Status: DC
  Administered 2016-10-31: 3300 mg via INTRAVENOUS
  Filled 2016-10-31: qty 66

## 2016-10-31 MED ORDER — DEXTROSE 5 % IV SOLN
400.0000 mg/m2 | Freq: Once | INTRAVENOUS | Status: AC
  Administered 2016-10-31: 656 mg via INTRAVENOUS
  Filled 2016-10-31: qty 32.8

## 2016-10-31 MED ORDER — DEXTROSE 5 % IV SOLN
Freq: Once | INTRAVENOUS | Status: AC
  Administered 2016-10-31: 12:00:00 via INTRAVENOUS

## 2016-10-31 MED ORDER — DEXAMETHASONE SODIUM PHOSPHATE 10 MG/ML IJ SOLN
10.0000 mg | Freq: Once | INTRAMUSCULAR | Status: AC
  Administered 2016-10-31: 10 mg via INTRAVENOUS

## 2016-10-31 MED ORDER — OXALIPLATIN CHEMO INJECTION 100 MG/20ML
60.0000 mg/m2 | Freq: Once | INTRAVENOUS | Status: AC
  Administered 2016-10-31: 100 mg via INTRAVENOUS
  Filled 2016-10-31: qty 20

## 2016-10-31 MED ORDER — PALONOSETRON HCL INJECTION 0.25 MG/5ML
INTRAVENOUS | Status: AC
  Filled 2016-10-31: qty 5

## 2016-10-31 MED ORDER — DEXAMETHASONE SODIUM PHOSPHATE 10 MG/ML IJ SOLN
INTRAMUSCULAR | Status: AC
  Filled 2016-10-31: qty 1

## 2016-10-31 NOTE — Telephone Encounter (Signed)
Scheduled appts per 10/31/2016 los. Patient is aware of appts added and will pick up new schedule on 4/13. Per MD messag okay for no MD visit in 2 weeks due to availability and 5/9 okay per Dr. Burr Medico to have lab and flush after MD visit.

## 2016-10-31 NOTE — Patient Instructions (Signed)
[place] Discharge Instructions for Patients Receiving Chemotherapy  Today you received the following chemotherapy agents 5 FU/Oxaliplatin/Leucovorin To help prevent nausea and vomiting after your treatment, we encourage you to take your nausea medication as prescribed.   If you develop nausea and vomiting that is not controlled by your nausea medication, call the clinic.   BELOW ARE SYMPTOMS THAT SHOULD BE REPORTED IMMEDIATELY:  *FEVER GREATER THAN 100.5 F  *CHILLS WITH OR WITHOUT FEVER  NAUSEA AND VOMITING THAT IS NOT CONTROLLED WITH YOUR NAUSEA MEDICATION  *UNUSUAL SHORTNESS OF BREATH  *UNUSUAL BRUISING OR BLEEDING  TENDERNESS IN MOUTH AND THROAT WITH OR WITHOUT PRESENCE OF ULCERS  *URINARY PROBLEMS  *BOWEL PROBLEMS  UNUSUAL RASH Items with * indicate a potential emergency and should be followed up as soon as possible.  Feel free to call the clinic you have any questions or concerns. The clinic phone number is [phone].  Please show the [place] at check-in to the Emergency Department and triage nurse.

## 2016-11-01 ENCOUNTER — Other Ambulatory Visit: Payer: Self-pay | Admitting: *Deleted

## 2016-11-01 ENCOUNTER — Ambulatory Visit (INDEPENDENT_AMBULATORY_CARE_PROVIDER_SITE_OTHER): Payer: Non-veteran care | Admitting: Surgical

## 2016-11-01 ENCOUNTER — Encounter: Payer: Self-pay | Admitting: Hematology

## 2016-11-01 VITALS — BP 119/70 | HR 92 | Resp 16 | Ht 65.0 in | Wt 136.2 lb

## 2016-11-01 DIAGNOSIS — Z09 Encounter for follow-up examination after completed treatment for conditions other than malignant neoplasm: Secondary | ICD-10-CM

## 2016-11-01 DIAGNOSIS — J90 Pleural effusion, not elsewhere classified: Secondary | ICD-10-CM

## 2016-11-01 DIAGNOSIS — C159 Malignant neoplasm of esophagus, unspecified: Secondary | ICD-10-CM

## 2016-11-01 NOTE — Patient Instructions (Signed)
Cont to increase nutrition as able

## 2016-11-01 NOTE — Progress Notes (Signed)
[address]       [place] [address]             [phone]                  Fabien B [facility] Medical Record #[id number] Date of Birth: 08/09/1940  Referring BL:[id number], Krista Blue, MD Primary Cardiology: Primary Care:WYMER,ANTOINETTE, MD  Chief Complaint:  Follow Up Visit  Cancer Staging Malignant neoplasm of lower third of esophagus [facility]) Staging form: Esophagus - Adenocarcinoma, AJCC 7th Edition - Clinical stage from 12/15/2015: Stage IIIA (T3, N1, M0) - Signed by Truitt Merle, MD on 12/24/2015 - Pathologic stage from 04/26/2016: yT0, N0, cM0, GX - Signed by [name], MD on 04/27/2016  04/23/2016 OPERATIVE REPORT PREOPERATIVE DIAGNOSIS: 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 SURGEON: Lanelle Bal, M.D.  Malignant neoplasm of lower third of esophagus [facility])   Staging form: Esophagus - Adenocarcinoma, AJCC 7th Edition   - Clinical stage from 12/15/2015: Stage IIIA (T3, N1, M0) - Signed by Truitt Merle, MD on 12/24/2015   - Pathologic stage from 04/26/2016: yT0, N0, cM0, GX - Signed by [name], MD on 04/27/2016    History of Present Illness:    The patient seen in the office on today's date ongoing surgical follow-up from above procedure. He continues to be followed closely at the cancer center and is undergoing chemotherapy currently. He is managing to gain some weight and appetite is improving. He continues to eat small amounts per each meal and has nighttime tube feedings. He denies shortness of breath.         Zubrod Score: At the time of surgery this  patient's most appropriate activity status/level should be described as: []     0    Normal activity, no symptoms []     1    Restricted in physical strenuous activity but ambulatory, able to do out light work []     2    Ambulatory and capable of self care, unable to do work activities, up and about                 >50 % of waking hours                                                                                   []     3    Only limited self care, in bed greater than 50% of waking hours []     4    Completely disabled, no self care, confined to bed or chair []     5    Moribund  History  Smoking Status  . Former Smoker  . Packs/day: 1.00  . Years: 10.00  . Quit date: 07/24/1967  Smokeless Tobacco  . Never Used       Allergies  Allergen Reactions  . No Known Allergies     Current Outpatient Prescriptions  Medication Sig Dispense Refill  . acetaminophen (TYLENOL) 325 MG tablet Take 650 mg by mouth every 6 (six) hours as needed.    Marland Kitchen atenolol (TENORMIN) 25 MG tablet Take 1 tab in the morning and half tab in the evening (Patient taking differently: Take 12.5-25 mg by mouth 2 (two) times daily. Take 25mg  in the morning and 12.5mg  in the evening) 60 tablet 1  . diphenhydrAMINE (BENADRYL) 25 MG tablet Take 25 mg by mouth every 6 (six) hours as needed.    . folic acid (FOLVITE) 1 MG tablet Take 1 tablet (1 mg total) by mouth daily. 30 tablet 0  . lidocaine-prilocaine (EMLA) cream Apply 1 application topically as needed. Apply 1-2 tsp over port site 1 hour prior to chemotherapy 30 g 1  . mirtazapine (REMERON) 15 MG tablet Take 1 tablet (15 mg total) by mouth at bedtime. (Patient not taking: Reported on 10/17/2016) 30 tablet 2  . Nutritional Supplements (FEEDING SUPPLEMENT, OSMOLITE 1.5 CAL,) LIQD Increase osmolite 1.5 to 6 cans to be infused via continuous pump for 19 hours per day. Flush with 179ml of water before and after continuous feeding.  Give an additional 169ml of water via tube.  Drink  an additional 654ml of water during the day.  Send formula and bags. 1422 mL 0  . ondansetron (ZOFRAN ODT) 8 MG disintegrating tablet Take 1 tablet (8 mg total) by mouth every 8 (eight) hours as needed for nausea or vomiting. 30 tablet 2  . oxycodone (OXY-IR) 5 MG capsule Take 5 mg by mouth every 6 (six) hours as needed for pain.     Marland Kitchen prochlorperazine (COMPAZINE) 10 MG tablet Take 1 tablet (10 mg total) by mouth every 6 (six) hours as needed for nausea or vomiting. 30 tablet 3  . vitamin B-12 1000 MCG tablet Take 1 tablet (1,000 mcg total) by mouth daily. 30 tablet 0  . Water For Irrigation, Sterile (FREE WATER) SOLN Place 220 mLs into feeding tube 4 (four) times daily.     No current facility-administered medications for this visit.        Physical Exam: Resp 16   Ht 5\' 5"  (1.651 m)   Wt 136 lb 3.2 oz (61.8 kg)   BMI 22.66 kg/m   General appearance: alert, cooperative and no distress Heart: regular rate and rhythm Lungs: Diminished in the left lower fields. Abdomen: Soft nontender, nondistended there is some thickened granulation type tissue surrounding the feeding tube without evidence of infection Wounds:  Diagnostic Studies & Laboratory data:         Recent Radiology Findings: Dg Chest 2 View  Result Date: 10/31/2016 CLINICAL DATA:  Left pleural effusion.  Esophageal carcinoma. EXAM: CHEST  2 VIEW COMPARISON:  09/19/2016 FINDINGS: Moderate left pleural effusion has increased in size since previous study, with associated left lower lung atelectasis. Right lung is clear. Heart size is within normal limits. Right-sided Port-A-Cath remains in appropriate position. IMPRESSION: Increased moderate left pleural effusion and left lower lobe atelectasis. Electronically Signed   By: Earle Gell M.D.   On: 10/31/2016 20:10      I have independently reviewed the above radiology findings and reviewed findings  with the patient.  Recent Labs: Lab Results  Component Value Date   WBC 5.2  10/31/2016   HGB 12.1 (L) 10/31/2016   HCT 35.6 (L) 10/31/2016   PLT 188 10/31/2016   GLUCOSE 110 10/31/2016   ALT 15 10/31/2016   AST 26 10/31/2016   NA 138 10/31/2016  K 4.4 10/31/2016   CL 108 08/31/2016   CREATININE 0.6 (L) 10/31/2016   BUN 11.4 10/31/2016   CO2 24 10/31/2016   TSH 2.422 05/03/2016   INR 1.42 08/28/2016      Assessment / Plan:  The patient is clinically pretty stable. He continues with close oncology follow-up. He does appear on chest x-ray to have a recurrent left sided pleural effusion that would benefit from thoracentesis. I did discuss a potential option of a Pleurx catheter placement but he is not interested in doing that at this time. We will schedule an ultrasound-guided left thoracentesis. We will follow up in the office in 6 weeks with repeat chest x-ray. His nutritional status appears to be steadily improving over time.        Kina Shiffman E 11/01/2016 9:35 AM

## 2016-11-02 ENCOUNTER — Ambulatory Visit (HOSPITAL_COMMUNITY)
Admission: RE | Admit: 2016-11-02 | Discharge: 2016-11-02 | Disposition: A | Discharge status: Home / Self Care | Payer: No Typology Code available for payment source | Source: Ambulatory Visit | Attending: Radiology | Admitting: Radiology

## 2016-11-02 ENCOUNTER — Ambulatory Visit (HOSPITAL_COMMUNITY)
Admission: RE | Admit: 2016-11-02 | Discharge: 2016-11-02 | Disposition: A | Discharge status: Home / Self Care | Payer: No Typology Code available for payment source | Source: Ambulatory Visit | Attending: Surgical | Admitting: Surgical

## 2016-11-02 ENCOUNTER — Ambulatory Visit (HOSPITAL_BASED_OUTPATIENT_CLINIC_OR_DEPARTMENT_OTHER): Payer: No Typology Code available for payment source

## 2016-11-02 VITALS — BP 122/71 | HR 103 | Temp 98.8°F | Resp 16

## 2016-11-02 DIAGNOSIS — I700 Atherosclerosis of aorta: Secondary | ICD-10-CM | POA: Diagnosis not present

## 2016-11-02 DIAGNOSIS — J90 Pleural effusion, not elsewhere classified: Secondary | ICD-10-CM | POA: Diagnosis not present

## 2016-11-02 DIAGNOSIS — Z9889 Other specified postprocedural states: Secondary | ICD-10-CM | POA: Insufficient documentation

## 2016-11-02 DIAGNOSIS — C155 Malignant neoplasm of lower third of esophagus: Secondary | ICD-10-CM

## 2016-11-02 MED ORDER — SODIUM CHLORIDE 0.9% FLUSH
10.0000 mL | INTRAVENOUS | Status: DC | PRN
  Administered 2016-11-02: 10 mL
  Filled 2016-11-02: qty 10

## 2016-11-02 MED ORDER — HEPARIN SOD (PORK) LOCK FLUSH 100 UNIT/ML IV SOLN
500.0000 [IU] | Freq: Once | INTRAVENOUS | Status: AC | PRN
  Administered 2016-11-02: 500 [IU]
  Filled 2016-11-02: qty 5

## 2016-11-02 NOTE — Procedures (Signed)
PROCEDURE SUMMARY:  Successful US guided left thoracentesis. Yielded 1.2 L of clear yellow fluid. Pt tolerated procedure well. No immediate complications.  Specimen was not sent for labs. CXR ordered.  Ascencion Dike PA-C 11/02/2016 10:38 AM

## 2016-11-07 NOTE — Progress Notes (Signed)
Nutrition Follow-up:  Spoke with patient via phone this pm regarding tube feeding.  Patient being treated for esophageal cancer, has J tube and a patient of Dr. Burr Medico.    Patient reports that he is now receiving all of his tube feeding supplies from [place] (ie pump, formula nutren 1.5, supplies and prescriptions). Reports that he is taking 4 cans of nutren 1.5 continuous via night time feeding as before and water flushes are the same.  "Everything is the same."  Reports that he is eating orally, small frequent meals.  Noted thoracentesis on 4.13 with 1.2 L fluid removed.  Medications: reviewed  Labs: reviewed  Anthropometrics:   Weight on 4/12 136 lb 3.2 oz increased from 130 pounds on 3/13.    Estimated Energy Needs  Kcals: 2030-2230 calories/d Protein: 85-100 g/d Fluid: 2.2 L/d  NUTRITION DIAGNOSIS: Unintentional weight loss improved   INTERVENTION:  Continue with 4 cans of nutren 1.5 via J-tube continuous at this time.  Patient reports Erasmo Downer, RD at [facility] checks in on him as well regarding tube feeding.  Agreeable for [facility] R[facility] to continue to follow as well. Continue to monitor weight and oral nutrition    MONITORING, EVALUATION, GOAL: patient will tolerate continuous feeding regimen to prevent weight loss.   NEXT VISIT: May 9 during infusion  Alfredia Desanctis B. Zenia Resides, [place], [place] Registered Dietitian [phone] (pager)

## 2016-11-12 ENCOUNTER — Ambulatory Visit (HOSPITAL_COMMUNITY)
Admission: RE | Admit: 2016-11-12 | Discharge: 2016-11-12 | Disposition: A | Discharge status: Home / Self Care | Payer: No Typology Code available for payment source | Source: Ambulatory Visit | Attending: Hematology | Admitting: Hematology

## 2016-11-12 DIAGNOSIS — J90 Pleural effusion, not elsewhere classified: Secondary | ICD-10-CM | POA: Insufficient documentation

## 2016-11-12 DIAGNOSIS — R938 Abnormal findings on diagnostic imaging of other specified body structures: Secondary | ICD-10-CM | POA: Diagnosis not present

## 2016-11-12 DIAGNOSIS — C155 Malignant neoplasm of lower third of esophagus: Secondary | ICD-10-CM | POA: Insufficient documentation

## 2016-11-12 LAB — GLUCOSE, CAPILLARY: Glucose-Capillary: 103 mg/dL — ABNORMAL HIGH (ref 65–99)

## 2016-11-12 MED ORDER — FLUDEOXYGLUCOSE F - 18 (FDG) INJECTION
6.8000 | Freq: Once | INTRAVENOUS | Status: AC | PRN
  Administered 2016-11-12: 6.8 via INTRAVENOUS

## 2016-11-14 ENCOUNTER — Other Ambulatory Visit (HOSPITAL_BASED_OUTPATIENT_CLINIC_OR_DEPARTMENT_OTHER): Payer: No Typology Code available for payment source

## 2016-11-14 ENCOUNTER — Ambulatory Visit (HOSPITAL_BASED_OUTPATIENT_CLINIC_OR_DEPARTMENT_OTHER): Payer: No Typology Code available for payment source

## 2016-11-14 VITALS — BP 100/48 | HR 78 | Temp 98.0°F | Resp 16

## 2016-11-14 DIAGNOSIS — C155 Malignant neoplasm of lower third of esophagus: Secondary | ICD-10-CM

## 2016-11-14 DIAGNOSIS — C7951 Secondary malignant neoplasm of bone: Secondary | ICD-10-CM

## 2016-11-14 DIAGNOSIS — Z5111 Encounter for antineoplastic chemotherapy: Secondary | ICD-10-CM

## 2016-11-14 LAB — CBC & DIFF AND RETIC
BASO%: 0.4 % (ref 0.0–2.0)
Basophils Absolute: 0 10*3/uL (ref 0.0–0.1)
EOS%: 3.4 % (ref 0.0–7.0)
Eosinophils Absolute: 0.2 10*3/uL (ref 0.0–0.5)
HCT: 35.1 % — ABNORMAL LOW (ref 38.4–49.9)
HGB: 12.2 g/dL — ABNORMAL LOW (ref 13.0–17.1)
IMMATURE RETIC FRACT: 6.5 % (ref 3.00–10.60)
LYMPH%: 23.5 % (ref 14.0–49.0)
MCH: 33.5 pg — AB (ref 27.2–33.4)
MCHC: 34.8 g/dL (ref 32.0–36.0)
MCV: 96.4 fL (ref 79.3–98.0)
MONO#: 0.8 10*3/uL (ref 0.1–0.9)
MONO%: 17.7 % — AB (ref 0.0–14.0)
NEUT%: 55 % (ref 39.0–75.0)
NEUTROS ABS: 2.6 10*3/uL (ref 1.5–6.5)
Platelets: 196 10*3/uL (ref 140–400)
RBC: 3.64 10*6/uL — AB (ref 4.20–5.82)
RDW: 15.1 % — AB (ref 11.0–14.6)
Retic %: 2.15 % — ABNORMAL HIGH (ref 0.80–1.80)
Retic Ct Abs: 78.26 10*3/uL (ref 34.80–93.90)
WBC: 4.7 10*3/uL (ref 4.0–10.3)
lymph#: 1.1 10*3/uL (ref 0.9–3.3)

## 2016-11-14 LAB — COMPREHENSIVE METABOLIC PANEL
ALBUMIN: 3.2 g/dL — AB (ref 3.5–5.0)
ALK PHOS: 165 U/L — AB (ref 40–150)
ALT: 16 U/L (ref 0–55)
ANION GAP: 10 meq/L (ref 3–11)
AST: 24 U/L (ref 5–34)
BUN: 12.4 mg/dL (ref 7.0–26.0)
CALCIUM: 9.2 mg/dL (ref 8.4–10.4)
CO2: 26 mEq/L (ref 22–29)
Chloride: 105 mEq/L (ref 98–109)
Creatinine: 0.6 mg/dL — ABNORMAL LOW (ref 0.7–1.3)
EGFR: >90 mL/min/{1.73_m2} (ref >90)
Glucose: 114 mg/dl (ref 70–140)
POTASSIUM: 4.4 meq/L (ref 3.5–5.1)
Sodium: 140 mEq/L (ref 136–145)
Total Bilirubin: 0.28 mg/dL (ref 0.20–1.20)
Total Protein: 6.3 g/dL — ABNORMAL LOW (ref 6.4–8.3)

## 2016-11-14 LAB — CEA (IN HOUSE-CHCC): CEA (CHCC-In House): 12.19 ng/mL — ABNORMAL HIGH (ref 0.00–5.00)

## 2016-11-14 MED ORDER — OXALIPLATIN CHEMO INJECTION 100 MG/20ML
60.0000 mg/m2 | Freq: Once | INTRAVENOUS | Status: AC
  Administered 2016-11-14: 100 mg via INTRAVENOUS
  Filled 2016-11-14: qty 20

## 2016-11-14 MED ORDER — DEXTROSE 5 % IV SOLN
Freq: Once | INTRAVENOUS | Status: AC
  Administered 2016-11-14: 13:00:00 via INTRAVENOUS

## 2016-11-14 MED ORDER — SODIUM CHLORIDE 0.9 % IV SOLN
2000.0000 mg/m2 | INTRAVENOUS | Status: DC
  Administered 2016-11-14: 3300 mg via INTRAVENOUS
  Filled 2016-11-14: qty 66

## 2016-11-14 MED ORDER — LEUCOVORIN CALCIUM INJECTION 350 MG
400.0000 mg/m2 | Freq: Once | INTRAMUSCULAR | Status: AC
  Administered 2016-11-14: 656 mg via INTRAVENOUS
  Filled 2016-11-14: qty 32.8

## 2016-11-14 MED ORDER — DEXAMETHASONE SODIUM PHOSPHATE 10 MG/ML IJ SOLN
INTRAMUSCULAR | Status: AC
  Filled 2016-11-14: qty 1

## 2016-11-14 MED ORDER — DEXAMETHASONE SODIUM PHOSPHATE 10 MG/ML IJ SOLN
10.0000 mg | Freq: Once | INTRAMUSCULAR | Status: AC
  Administered 2016-11-14: 10 mg via INTRAVENOUS

## 2016-11-14 MED ORDER — PALONOSETRON HCL INJECTION 0.25 MG/5ML
0.2500 mg | Freq: Once | INTRAVENOUS | Status: AC
  Administered 2016-11-14: 0.25 mg via INTRAVENOUS

## 2016-11-14 MED ORDER — PALONOSETRON HCL INJECTION 0.25 MG/5ML
INTRAVENOUS | Status: AC
  Filled 2016-11-14: qty 5

## 2016-11-14 NOTE — Patient Instructions (Signed)
[facility] Discharge Instructions for Patients Receiving Chemotherapy  Today you received the following chemotherapy agents: Oxaliplatin, Leucovorin and Adrucil.   To help prevent nausea and vomiting after your treatment, we encourage you to take your nausea medication as directed.  If you develop nausea and vomiting that is not controlled by your nausea medication, call the clinic.   BELOW ARE SYMPTOMS THAT SHOULD BE REPORTED IMMEDIATELY:  *FEVER GREATER THAN 100.5 F  *CHILLS WITH OR WITHOUT FEVER  NAUSEA AND VOMITING THAT IS NOT CONTROLLED WITH YOUR NAUSEA MEDICATION  *UNUSUAL SHORTNESS OF BREATH  *UNUSUAL BRUISING OR BLEEDING  TENDERNESS IN MOUTH AND THROAT WITH OR WITHOUT PRESENCE OF ULCERS  *URINARY PROBLEMS  *BOWEL PROBLEMS  UNUSUAL RASH Items with * indicate a potential emergency and should be followed up as soon as possible.  Feel free to call the clinic you have any questions or concerns. The clinic phone number is [phone].  Please show the CHEMO ALERT CARD at check-in to the Emergency Department and triage nurse.   

## 2016-11-16 ENCOUNTER — Ambulatory Visit (HOSPITAL_BASED_OUTPATIENT_CLINIC_OR_DEPARTMENT_OTHER): Payer: No Typology Code available for payment source

## 2016-11-16 VITALS — BP 109/58 | HR 99 | Temp 98.8°F | Resp 18

## 2016-11-16 DIAGNOSIS — C155 Malignant neoplasm of lower third of esophagus: Secondary | ICD-10-CM | POA: Diagnosis not present

## 2016-11-16 MED ORDER — HEPARIN SOD (PORK) LOCK FLUSH 100 UNIT/ML IV SOLN
500.0000 [IU] | Freq: Once | INTRAVENOUS | Status: AC | PRN
  Administered 2016-11-16: 500 [IU]
  Filled 2016-11-16: qty 5

## 2016-11-16 MED ORDER — SODIUM CHLORIDE 0.9% FLUSH
10.0000 mL | INTRAVENOUS | Status: DC | PRN
  Administered 2016-11-16: 10 mL
  Filled 2016-11-16: qty 10

## 2016-11-16 NOTE — Patient Instructions (Signed)
Implanted Port Home Guide An implanted port is a type of central line that is placed under the skin. Central lines are used to provide IV access when treatment or nutrition needs to be given through a person's veins. Implanted ports are used for long-term IV access. An implanted port may be placed because:  You need IV medicine that would be irritating to the small veins in your hands or arms.  You need long-term IV medicines, such as antibiotics.  You need IV nutrition for a long period.  You need frequent blood draws for lab tests.  You need dialysis.  Implanted ports are usually placed in the chest area, but they can also be placed in the upper arm, the abdomen, or the leg. An implanted port has two main parts:  Reservoir. The reservoir is round and will appear as a small, raised area under your skin. The reservoir is the part where a needle is inserted to give medicines or draw blood.  Catheter. The catheter is a thin, flexible tube that extends from the reservoir. The catheter is placed into a large vein. Medicine that is inserted into the reservoir goes into the catheter and then into the vein.  How will I care for my incision site? Do not get the incision site wet. Bathe or shower as directed by your health care provider. How is my port accessed? Special steps must be taken to access the port:  Before the port is accessed, a numbing cream can be placed on the skin. This helps numb the skin over the port site.  Your health care provider uses a sterile technique to access the port. ? Your health care provider must put on a mask and sterile gloves. ? The skin over your port is cleaned carefully with an antiseptic and allowed to dry. ? The port is gently pinched between sterile gloves, and a needle is inserted into the port.  Only "non-coring" port needles should be used to access the port. Once the port is accessed, a blood return should be checked. This helps ensure that the port  is in the vein and is not clogged.  If your port needs to remain accessed for a constant infusion, a clear (transparent) bandage will be placed over the needle site. The bandage and needle will need to be changed every week, or as directed by your health care provider.  Keep the bandage covering the needle clean and dry. Do not get it wet. Follow your health care provider's instructions on how to take a shower or bath while the port is accessed.  If your port does not need to stay accessed, no bandage is needed over the port.  What is flushing? Flushing helps keep the port from getting clogged. Follow your health care provider's instructions on how and when to flush the port. Ports are usually flushed with saline solution or a medicine called heparin. The need for flushing will depend on how the port is used.  If the port is used for intermittent medicines or blood draws, the port will need to be flushed: ? After medicines have been given. ? After blood has been drawn. ? As part of routine maintenance.  If a constant infusion is running, the port may not need to be flushed.  How long will my port stay implanted? The port can stay in for as long as your health care provider thinks it is needed. When it is time for the port to come out, surgery will be   done to remove it. The procedure is similar to the one performed when the port was put in. When should I seek immediate medical care? When you have an implanted port, you should seek immediate medical care if:  You notice a bad smell coming from the incision site.  You have swelling, redness, or drainage at the incision site.  You have more swelling or pain at the port site or the surrounding area.  You have a fever that is not controlled with medicine.  This information is not intended to replace advice given to you by your health care provider. Make sure you discuss any questions you have with your health care provider. Document  Released: 07/09/2005 Document Revised: 12/15/2015 Document Reviewed: 03/16/2013 [employer] Interactive Patient Education  2017 [employer]  

## 2016-11-19 ENCOUNTER — Other Ambulatory Visit: Payer: Self-pay | Admitting: *Deleted

## 2016-11-19 DIAGNOSIS — C155 Malignant neoplasm of lower third of esophagus: Secondary | ICD-10-CM

## 2016-11-19 MED ORDER — MIRTAZAPINE 15 MG PO TABS: 15.0000 mg | ORAL_TABLET | Freq: Every day | ORAL | 2 refills | Status: AC

## 2016-11-20 ENCOUNTER — Telehealth: Payer: Self-pay | Admitting: Hematology

## 2016-11-20 NOTE — Telephone Encounter (Signed)
Patient daughter called and wanted some clarification of the last appointment and the latest test results.  She said her mom came with her dad and she was a little confused as to what is going on.

## 2016-11-21 ENCOUNTER — Telehealth: Payer: Self-pay | Admitting: Hematology

## 2016-11-21 ENCOUNTER — Encounter: Payer: Self-pay | Admitting: Hematology

## 2016-11-21 NOTE — Telephone Encounter (Signed)
Spoke with daughter & Tiffany/Medical Records took care of getting her some information.  Informed that Dr Burr Medico will call her today.

## 2016-11-21 NOTE — Telephone Encounter (Signed)
Fred [name], daughter of patient came in to pick up copy of patient's bone marrow biopsy. Called patient to verify that this was ok which he said it was and filled out verbal request for release of medical records form after verifying 3 identifiers. Gave copy of requested info to daughter and scanned copy of verbal request form in to Epic. Daughter also had questions about diagnosis and alternative treatment options other than chemo. Spoke to Dr. Ernestina Penna nurse Janifer Adie in regards to speaking to patient's daughter about this and she stated that she will go out and talk to her

## 2016-11-22 ENCOUNTER — Telehealth: Payer: Self-pay | Admitting: *Deleted

## 2016-11-22 NOTE — Telephone Encounter (Signed)
Mailed letter signed by Dr. Burr Medico to pt's home today  11/22/16.

## 2016-11-26 NOTE — Progress Notes (Signed)
We will continue as is  [facility]  Telephone:([phone] [phone]  Clinic Follow up Note   Patient Care Team: [name], MD as PCP - General (Internal Medicine) Truitt Merle, MD as Consulting Physician (Hematology) Kyung Rudd, MD as Consulting Physician (Radiation Oncology) [name], MD as Consulting Physician (Cardiothoracic Surgery) Karie Mainland, RD as Dietitian (Nutrition) Minus Breeding, MD as Consulting Physician (Cardiology)   CHIEF COMPLAINTS:  Follow up metastatic esophageal cancer    Oncology History   Presented with solid dysphagia at least daily with chest pain. Involuntary weight loss of 10-12 lb over past year  Malignant neoplasm of lower third of esophagus (HCC)   Staging form: Esophagus - Adenocarcinoma, AJCC 7th Edition   - Clinical stage from 12/15/2015: Stage IIIA (T3, N1, M0) - Signed by Truitt Merle, MD on 12/24/2015   - Pathologic stage from 04/26/2016: yT0, N0, cM0, GX - Signed by [name], MD on 04/27/2016      Malignant neoplasm of lower third of esophagus ([place])   11/23/2015 Procedure    EGD per [facility](Dr. Toledo):6cm mass in lower third of esophagus      11/24/2015 Initial Diagnosis    Esophageal cancer ([place])      11/24/2015 Pathology Results    Mucinous adenocarcinoma, moderately differentiated-Her2 analysis pending      12/05/2015 Imaging    PET scan showed hypermetabolic a mass in distal esophagus, hypermetabolic activity in the left anterior prostate gland, metabolic density in left adrenal nodule, CT attenuation suggest a benign adrenal adenoma. Left apical 2 mm lung nodule.      12/15/2015 Procedure    EUS showed a uT3N1 lesion from 27cm to 35cm from the incisors       12/26/2015 - 02/02/2016 Chemotherapy    Weekly carboplatin AUC 2 and Taxol 45 mg/m, with concurrent radiation      12/26/2015 - 02/02/2016 Radiation Therapy    Neoadjuvant chemoradiation to the esophageal  cancer      04/23/2016 Surgery    total esophagectomy with cervical esophagogastrostomy, pyloroplasty, feeding jejunostomy       04/23/2016 Pathology Results    Esophagectomy showed no residual tumor, 8 lymph nodes were all negative, incidental leiomyoma 0.2 cm      08/14/2016 PET scan    IMPRESSION: 1. Small hypermetabolic left adrenal mass, this has not changed in size compared to 12/01/15 but has a maximum standard uptake value of 6.2 (previously 4.2). Indeterminate density and MRI characteristics, not specific for adenoma. Early metastatic disease is not readily excluded although the lack of change of size is somewhat reassuring. Surveillance recommended. 2. No other hypermetabolic lesions are identified. 3. Moderate to large left pleural effusion with passive atelectasis. 4. Esophagectomy with gastric pull-through. 5. No hypermetabolic liver lesions are seen. 6. Enlarged prostate gland.      08/16/2016 - 08/23/2016 Hospital Admission    He was diagnosed with DIC, unclear etiology. He was given blood products. He underwent echocardiogram which was negative for endocarditis, CTA chest/abd which was negative for aneurysm or dissection as work up for DIC. He was on prednisone for some time, but stopped as it was not helping much for DIC.       08/28/2016 - 09/01/2016 Hospital Admission    He has had issues with recurrent L sided pleural effusions, DIC and melena. He was last admitted in 1/25, given supportive care with a thoracentesis and blood transfusion. Dr Burr Medico has been following DIC and has  requested a direct admit for melena, anemia and thrombocytopenia. GI called for EGD.        08/31/2016 Pathology Results    Bone Marrow Biopsy, right iliac - METASTATIC ADENOCARCINOMA. - SEE COMMENT. PERIPHERAL BLOOD: - NORMOCYTIC ANEMIA. - THROMBOCYTOPENIA.      08/31/2016 Progression    Bone marrow biopsy showed metastatic adenocarcinoma      09/04/2016 -  Chemotherapy    FOLFOX every 2  weeks       09/19/2016 Procedure    Therapeutic thoracentesis of a left pleural effusion performed on 09/19/16 yielded 1.5 liters of blood-tinged fluid. Reactive appearing mesothelial cells were present.       HISTORY OF PRESENTING ILLNESS (12/14/2015):  Fred [name] 76 y.o. male is here because of His newly diagnosed esophageal cancer. He is accompanied by his wife and daughter to my clinic today.  He has had dysphagia for 2 months, mainly with solid food, no dysphagia with soft food or liquid. He also has mild pain in mid chest when he swallows. No nausea, abdominal pain, or other discomfort. He has lost about 15 lbs in the last year, no other symoptoms. He was seen by his primary care physician at [facility], and referred to gastroenterologist Dr. Alice Reichert. He underwent EGD on 11/23/2015, which showed a 6 cm mass in the lower third of esophagus. Biopsy showed adenocarcinoma, moderately differentiated. He was referred to [place] to consider neoadjuvant therapy. He is scheduled to see a Psychologist, sport and exercise at [facility] later this week.  He has had some urinary frequency, underwent TURP on 11/02/2015.   CURRENT THERAPY: first line chemo FOLFOX every 2 weeks started on 09/04/2016  INTERIM HISTORY:  Fred [name] returns for follow up and 7th cycle chemo. He presents to the clinic today with his wife and daughter. He reports to vomiting after chemo a few times and taking nausea medication that helps. He reports his feeding tube is bleeding more than it ways and he is doing mostly 13 hours of feeding. He changes the dressing every other day. Some days his Tube site bleeds more than others. His wife reports using clear plastic covers while in the shower. They do not recall if  Dr. Clover Mealy said they could not get it wet. His daughter reports the feeding tube get stopped up everyday now. The wife requests an extension authorization for the [place] that would be good to September. Misty Kenton Kingfisher is the person of contact based on the  family. We put a new dressing on feeding tube in clinic. The daughter reports he has low energy.    He was seen in the infusion room. The patient states he feels well. He reports feeling short of breath. He dose 12 feedings a day via feeding tube. He eats by mouth as well and is gradually increasing the amount. He reports the area around his feeding tube has not changed, with drainage.   MEDICAL HISTORY:  Past Medical History:  Diagnosis Date  . Esophageal cancer ([place]) 11/23/15   lower 3rd esohagus   . GERD (gastroesophageal reflux disease)   . Hyperlipidemia   . Hypertension     SURGICAL HISTORY: Past Surgical History:  Procedure Laterality Date  . CHEST TUBE INSERTION Left 04/23/2016   Procedure: CHEST TUBE INSERTION;  Surgeon: [name], MD;  Location: [place];  Service: Thoracic;  Laterality: Left;  . COLONOSCOPY N/A 08/30/2016   Procedure: COLONOSCOPY;  Surgeon: Milus Banister, MD;  Location: [facility];  Service: Endoscopy;  Laterality: N/A;  . COMPLETE ESOPHAGECTOMY N/A 04/23/2016   Procedure: TRANSHIATIAL TOTAL ESOPHAGECTOMY COMPLETE; CERVICAL ESOPHAGOGASTROSTOMY AND PYLOROPLASTY;  Surgeon: [name], MD;  Location: [place];  Service: Thoracic;  Laterality: N/A;  . cystoscope  4/12/1   prostate  . ERCP    . ESOPHAGOGASTRODUODENOSCOPY (EGD) WITH PROPOFOL N/A 08/29/2016   Procedure: ESOPHAGOGASTRODUODENOSCOPY (EGD) WITH PROPOFOL;  Surgeon: Milus Banister, MD;  Location: [facility];  Service: Endoscopy;  Laterality: N/A;  . INGUINAL HERNIA REPAIR    . IR GENERIC HISTORICAL  08/31/2016   IR FLUORO GUIDE PORT INSERTION RIGHT 08/31/2016 Corrie Mckusick, DO [facility]-INTER[facility]  . IR GENERIC HISTORICAL  08/31/2016   IR US GUIDE VASC ACCESS RIGHT 08/31/2016 Corrie Mckusick, DO [facility]  . JEJUNOSTOMY N/A 04/23/2016   Procedure: FEEDING JEJUNOSTOMY;  Surgeon: [name], MD;  Location: [place];  Service: Thoracic;  Laterality: N/A;  . LEG SURGERY Left    hole  . PROSTATE BIOPSY   10/05/15  . right shoulder surgery    . VIDEO BRONCHOSCOPY N/A 04/23/2016   Procedure: VIDEO BRONCHOSCOPY;  Surgeon: [name], MD;  Location: [facility] OR;  Service: Thoracic;  Laterality: N/A;    SOCIAL HISTORY: Social History   Social History  . Marital status: Married    Spouse name: N/A  . Number of children: 1  . Years of education: N/A   Occupational History  . Truck Geophysicist/field seismologist    Social History Main Topics  . Smoking status: Former Smoker    Packs/day: 1.00    Years: 10.00    Quit date: 07/24/1967  . Smokeless tobacco: Never Used  . Alcohol use No  . Drug use: No  . Sexual activity: Not Currently   Other Topics Concern  . Not on file   Social History Narrative  . No narrative on file    FAMILY HISTORY: Family History  Problem Relation Age of Onset  . Cancer Maternal Grandfather 56    gastric cancer   . Alzheimer's disease Mother   . Diabetes Mother   . Stroke Father 52  . Multiple sclerosis Sister     ALLERGIES:  is allergic to no known allergies.  MEDICATIONS:  Current Outpatient Prescriptions  Medication Sig Dispense Refill  . acetaminophen (TYLENOL) 325 MG tablet Take 650 mg by mouth every 6 (six) hours as needed.    Marland Kitchen atenolol (TENORMIN) 25 MG tablet Take 1 tab in the morning and half tab in the evening (Patient taking differently: Take 12.5-25 mg by mouth 2 (two) times daily. Take 53m in the morning and 12.528min the evening) 60 tablet 1  . folic acid (FOLVITE) 1 MG tablet Take 1 tablet (1 mg total) by mouth daily. 30 tablet 0  . lidocaine-prilocaine (EMLA) cream Apply 1 application topically as needed. Apply 1-2 tsp over port site 1 hour prior to chemotherapy 30 g 1  . mirtazapine (REMERON) 15 MG tablet Take 1 tablet (15 mg total) by mouth at bedtime. 30 tablet 2  . ondansetron (ZOFRAN ODT) 8 MG disintegrating tablet Take 1 tablet (8 mg total) by mouth every 8 (eight) hours as needed for nausea or vomiting. 30 tablet 2  . prochlorperazine (COMPAZINE)  10 MG tablet Take 1 tablet (10 mg total) by mouth every 6 (six) hours as needed for nausea or vomiting. 30 tablet 3  . UNABLE TO FIND by Feeding Tube route. Med Name: Nutrem feeding supplement-4 cans continuous at hs vias pump -Supplied via [place] [place]  .  vitamin B-12 1000 MCG tablet Take 1 tablet (1,000 mcg total) by mouth daily. 30 tablet 0  . Water For Irrigation, Sterile (FREE WATER) SOLN Place 220 mLs into feeding tube 4 (four) times daily.    . Nutritional Supplements (FEEDING SUPPLEMENT, OSMOLITE 1.5 CAL,) LIQD Increase osmolite 1.5 to 6 cans to be infused via continuous pump for 19 hours per day. Flush with 148m of water before and after continuous feeding.  Give an additional 1842mof water via tube.  Drink an additional 60066mf water during the day.  Send formula and bags. (Patient not taking: Reported on 11/28/2016) 1422 mL 0  . oxycodone (OXY-IR) 5 MG capsule Take 5 mg by mouth every 6 (six) hours as needed for pain.      No current facility-administered medications for this visit.    Facility-Administered Medications Ordered in Other Visits  Medication Dose Route Frequency Provider Last Rate Last Dose  . dexamethasone (DECADRON) injection 10 mg  10 mg Intravenous Once FenTruitt MerleD      . fluorouracil (ADRUCIL) 3,300 mg in sodium chloride 0.9 % 84 mL chemo infusion  2,000 mg/m2 (Treatment Plan Recorded) Intravenous 1 day or 1 dose FenTruitt MerleD      . leucovorin 656 mg in dextrose 5 % 250 mL infusion  400 mg/m2 (Treatment Plan Recorded) Intravenous Once FenTruitt MerleD      . oxaliplatin (ELOXATIN) 100 mg in dextrose 5 % 500 mL chemo infusion  60 mg/m2 (Treatment Plan Recorded) Intravenous Once FenTruitt MerleD        REVIEW OF SYSTEMS:   Constitutional: Denies fevers, chills or abnormal night sweats (+) loss of energy (+) fatigue Eyes: Denies blurriness of vision, double vision or watery eyes Ears, nose, mouth, throat, and face: Denies mucositis or sore throat Respiratory: Denies cough,  dyspnea or wheezes Cardiovascular: Denies palpitation, chest discomfort or lower extremity swelling Gastrointestinal:  Denies heartburn or change in bowel habits (+) Drainage around J-tube. (+) some bleeding from feeding tube (+)  Nausea  Skin: Denies abnormal skin rashes Lymphatics: Denies new lymphadenopathy or easy bruising Neurological:Denies numbness, tingling or new weaknesses Behavioral/Psych: Mood is stable, no new changes  Musculoskeletal: (+) L hip pain All other systems were reviewed with the patient and are negative.  PHYSICAL EXAMINATION: ECOG PERFORMANCE STATUS: 2 The pressure 119/70, heart rate 92, respiratory rate 16, pulse ox 98% on room air Vitals:   11/28/16 0907  BP: 102/63  Pulse: 88  Resp: 18  Temp: 98.9 F (37.2 C)  TempSrc: Oral  SpO2: 99%  Weight: 136 lb 1.6 oz (61.7 kg)  Height: 5' 5"  (1.651 m)      GENERAL:alert, no distress and comfortable, in wheelchair SKIN: Appears to be pale, skin texture, turgor are normal, no rashes or significant lesions EYES: normal, conjunctiva are pink and non-injected, sclera clear OROPHARYNX:no exudate, no erythema and lips, buccal mucosa, and tongue normal  NECK: supple, thyroid normal size, non-tender, without nodularity LYMPH:  no palpable lymphadenopathy in the cervical, axillary or inguinal LUNGS: clear to auscultation and percussion with normal breathing effort (+) except decreased breath sound at the left lung base. HEART: regular rate & rhythm and no murmurs and no lower extremity edema ABDOMEN:abdomen soft, non-tender and normal bowel sounds, J tube in place. No skin erythema or pus. (+) Hepatomegaly, liver is palpable 2-3 cm below the rib cage, nontender. (+) Feeding tube site with small bleeding around feeding tube site, minimal amount of yellowish secretion. No skin  erythema around J tube. Normal mucosa  Musculoskeletal:no cyanosis of digits and no clubbing  PSYCH: alert & oriented x 3 with fluent speech NEURO:  no focal motor/sensory deficits  LABORATORY DATA:  I have reviewed the data as listed CBC Latest Ref Rng & Units 11/28/2016 11/14/2016 10/31/2016  WBC 4.0 - 10.3 10e3/uL 5.7 4.7 5.2  Hemoglobin 13.0 - 17.1 g/dL 12.2(L) 12.2(L) 12.1(L)  Hematocrit 38.4 - 49.9 % 35.9(L) 35.1(L) 35.6(L)  Platelets 140 - 400 10e3/uL 160 196 188   CMP Latest Ref Rng & Units 11/28/2016 11/14/2016 10/31/2016  Glucose 70 - 140 mg/dl 115 114 110  BUN 7.0 - 26.0 mg/dL 13.8 12.4 11.4  Creatinine 0.7 - 1.3 mg/dL 0.6(L) 0.6(L) 0.6(L)  Sodium 136 - 145 mEq/L 138 140 138  Potassium 3.5 - 5.1 mEq/L 4.5 4.4 4.4  Chloride 101 - 111 mmol/L - - -  CO2 22 - 29 mEq/L 25 26 24   Calcium 8.4 - 10.4 mg/dL 9.4 9.2 9.4  Total Protein 6.4 - 8.3 g/dL 6.5 6.3(L) 6.5  Total Bilirubin 0.20 - 1.20 mg/dL 0.45 0.28 0.40  Alkaline Phos 40 - 150 U/L 161(H) 165(H) 160(H)  AST 5 - 34 U/L 24 24 26   ALT 0 - 55 U/L 16 16 15    Results for Fred [name], Fred [name] (MRN [id number]) as of 11/26/2016 08:42  Ref. Range 10/02/2016 09:17 10/17/2016 11:15 11/14/2016 11:04  [facility] ([facility]) Latest Ref Range: 0.00 - 5.00 ng/mL 397.90 (H) 86.66 (H) 12.19 (H)    Pathology report [facility] TEST[facility] 09/29/2016   PD-L1 Testing 09/29/2016   Diagnosis 09/27/2016 Consult- Comprehensive, [id number] - 1A Mass, Lower third of Esophagus, Mucosal Biopsy - ADENOCARCINOMA WITH EXTRACELLULAR MUCIN. - SEE COMMENT. Microscopic Comment Provided Her2 IHC with the appropriate controls is negative. Per report Her2 FISH is also negative. There is limited tissue remaining, but [facility] and PDL-1 testing will be attempted as requested.  Diagnosis 09/19/2016 PLEURAL FLUID, LEFT (SPECIMEN 1 OF 1 COLLECTED 09/19/16): REACTIVE APPEARING MESOTHELIAL CELLS PRESENT.  Diagnosis 08/31/2016 Bone Marrow Biopsy, right iliac - METASTATIC ADENOCARCINOMA. - SEE COMMENT. PERIPHERAL BLOOD: - NORMOCYTIC ANEMIA. - THROMBOCYTOPENIA.  Diagnosis 08/02/16 PLEURAL FLUID, LEFT (SPECIMEN 1 OF 1  COLLECTED 08/02/16): REACTIVE MESOTHELIAL CELLS PRESENT. LYMPHOCYTES PRESENT.  Diagnosis 11/23/2015 Mass, lower third of the esophagus, mucosal biopsy Mucinous adenocarcinoma, moderately differentiated.  Diagnosis 04/23/2016 1. Lymph node, biopsy, Cervical - ONE OF ONE LYMPH NODES NEGATIVE FOR CARCINOMA (0/1). 2. Lymph node, biopsy, Periesophageal - BLOOD WITH SCANT BENIGN SOFT TISSUE AND SKELETAL MUSCLE. - NO MALIGNANCY IDENTIFIED. 3. Esophagogastrectomy - ULCERATION WITH INFLAMMATION AND REACTIVE CHANGES. - FOCAL INTESTINAL METAPLASIA. - CHANGES CONSISTENT WITH TREATMENT EFFECT. - SEVEN OF SEVEN LYMPH NODES NEGATIVE FOR CARCINOMA (0/7). - INCIDENTAL LEIOMYOMA, 0.2 CM. - MARGINS ARE NEGATIVE FOR DYSPLASIA OR MALIGNANCY. - SEE ONCOLOGY TABLE. Microscopic Comment 3. ESOPHAGUS: Specimen: Esophagus and proximal stomach, cervical lymph node. Procedure: Esophagogastrectomy and cervical lymph node biopsy. Tumor Site: Presumed GE junction, see comment. Relationship of Tumor to esophagogastric junction: Can not be assessed. Distance of tumor center from esophagogastric junction: Can not be assessed. Tumor Size Greatest dimension: No residual tumor present. Histologic Type: Per medical record adenocarcinoma (no biopsy for review). Histologic Grade: N/A. Microscopic Tumor Extension: N/A. Margins: Negative. Treatment Effect: Present (TRS 0). Lymph-Vascular Invasion: Not identified. Perineural Invasion: Not identified. Lymph nodes: number examined 8; number positive: 0 TNM: ypT0, ypN0 see comment. 1 of 3 FINAL for Fred [name], Fred [name] ([id number]) Microscopic Comment(continued) Ancillary studies: None. Comments: The entire  GE junction is submitted for evaluation. There is ulceration with associated inflammation. There is acellular mucin/myxoid changes which extend through the muscularis propria, but no viable/residual tumor is identified and thus the stage is ypT0. There is focal background  intestinal metaplasia, which may have been pre-existing (Barrett's esophagus) or related to therapy.   RADIOGRAPHIC STUDIES: I have personally reviewed the radiological images as listed and agreed with the findings in the report. Dg Chest 1 View  Result Date: 11/02/2016 CLINICAL DATA:  Status post left thoracentesis EXAM: CHEST 1 VIEW COMPARISON:  October 31, 2016 FINDINGS: Left pleural effusion is significantly smaller following thoracentesis. No pneumothorax. There remains small left pleural effusion with left base atelectasis. Lungs elsewhere are clear. Heart size is normal. Pulmonary vascularity is normal. No adenopathy. There is aortic atherosclerosis. There is a Port-A-Cath with tip in superior vena cava. No evident bone lesions. IMPRESSION: No pneumothorax. Small residual left pleural effusion with left base atelectasis. Lungs elsewhere clear. Stable cardiac silhouette. There is aortic atherosclerosis. Electronically Signed   By: Lowella Grip III M.D.   On: 11/02/2016 10:47   Dg Chest 2 View  Result Date: 10/31/2016 CLINICAL DATA:  Left pleural effusion.  Esophageal carcinoma. EXAM: CHEST  2 VIEW COMPARISON:  09/19/2016 FINDINGS: Moderate left pleural effusion has increased in size since previous study, with associated left lower lung atelectasis. Right lung is clear. Heart size is within normal limits. Right-sided Port-A-Cath remains in appropriate position. IMPRESSION: Increased moderate left pleural effusion and left lower lobe atelectasis. Electronically Signed   By: Earle Gell M.D.   On: 10/31/2016 20:10   Nm Pet Image Restag (ps) Skull Base To Thigh  Result Date: 11/12/2016 CLINICAL DATA:  Subsequent treatment strategy for distal esophageal carcinoma. EXAM: NUCLEAR MEDICINE PET SKULL BASE TO THIGH TECHNIQUE: 6.8 mCi F-18 FDG was injected intravenously. Full-ring PET imaging was performed from the skull base to thigh after the radiotracer. CT data was obtained and used for attenuation  correction and anatomic localization. FASTING BLOOD GLUCOSE:  Value: 103 mg/dl COMPARISON:  August 14, 2016 FINDINGS: NECK No hypermetabolic lymph nodes in the neck. CHEST Mild uptake in bilateral hila may simply represent reactive nodes with no CT correlate. The left-sided pleural effusion may be a little larger in the interval and appears to have loculated components. Mild underlying uptake may be due to atelectasis. The patient is status post esophagectomy with pull-through. No other abnormality seen within the chest. ABDOMEN/PELVIS There is mild uptake in the right adrenal gland without CT correlate. This uptake was not seen previously. The maximum SUV is 3.0. The uptake within the left adrenal gland persists. The maximum SUV is 3.7 today versus 6.2 previously, decreased in the interval. The nodule itself does not demonstrate any worsening. The patient has a jejunostomy tube. There is uptake surrounding the jejunostomy tube, likely reactive. Physiologic bowel uptake is identified. No hepatic metastases are seen. No FDG avid adenopathy is seen in the abdomen or pelvis. There is focal uptake in the anterior left prostate gland which is more prominent the interval. It is difficult to accurately measure the FDG uptake due to the close proximity of the bladder. However, the region of uptake is definitively larger in the interval. No other abnormal soft tissue uptake is seen in the abdomen or pelvis. SKELETON Heterogeneous uptake in the pelvic bones lumbar spine, and lower thoracic spine are consistent with known metastatic disease to the bones. IMPRESSION: 1. Heterogeneous bony uptake with several mildly avid foci primarily in the  lower thoracic spine, lumbar spine, and pelvic bones consistent with known bony metastatic disease. 2. There is new uptake in the right adrenal gland not seen previously with no CT correlate. Recommend attention on follow-up. 3. A rounded focus of uptake in the anterior peripheral left  prostate is larger in the interval. The findings are nonspecific and could be infectious, inflammatory, or neoplastic. Focal prostatitis or prostate cancer are most likely. 4. The uptake in the left adrenal gland has decreased in the interval. 5. Mild uptake in the bilateral hila may simply represent reactive nodes. Recommend attention on follow-up. 6. The left-sided pleural effusion is a little larger in the interval with loculated components. Electronically Signed   By: Dorise Bullion III M.D   On: 11/12/2016 17:01   US Thoracentesis Asp Pleural Space W/img Guide  Result Date: 11/02/2016 INDICATION: Metastatic esophageal cancer. Shortness of breath. Recurrent left pleural effusion. Request for therapeutic thoracentesis. EXAM: ULTRASOUND GUIDED LEFT THORACENTESIS MEDICATIONS: None. COMPLICATIONS: None immediate. Postprocedural chest x-ray negative for new mass PROCEDURE: An ultrasound guided thoracentesis was thoroughly discussed with the patient and questions answered. The benefits, risks, alternatives and complications were also discussed. The patient understands and wishes to proceed with the procedure. Written consent was obtained. Ultrasound was performed to localize and mark an adequate pocket of fluid in the left chest. The area was then prepped and draped in the normal sterile fashion. 1% Lidocaine was used for local anesthesia. Under ultrasound guidance a Safe-T-Centesis catheter was introduced. Thoracentesis was performed. The catheter was removed and a dressing applied. FINDINGS: A total of approximately 1.2 L of clear yellow fluid was removed. IMPRESSION: Successful ultrasound guided left thoracentesis yielding 1.2 L of pleural fluid. Read by: Ascencion Dike PA-C Electronically Signed   By: Markus Daft M.D.   On: 11/02/2016 11:00   PET 08/14/2016 IMPRESSION: 1. Small hypermetabolic left adrenal mass, this has not changed in size compared to 12/01/15 but has a maximum standard uptake value of 6.2  (previously 4.2). Indeterminate density and MRI characteristics, not specific for adenoma. Early metastatic disease is not readily excluded although the lack of change of size is somewhat reassuring. Surveillance recommended. 2. No other hypermetabolic lesions are identified. 3. Moderate to large left pleural effusion with passive atelectasis. 4. Esophagectomy with gastric pull-through. 5. No hypermetabolic liver lesions are seen. 6. Enlarged prostate gland.  CT angio chest/abdomen/pelvis for dissection w/wo contrast 08/22/2016 IMPRESSION: No evidence of aortic aneurysm or dissection. No evidence of pulmonary embolus. Heart is borderline in size. Stable small left adrenal nodule which was hypermetabolic on prior PET CT. Stable large left pleural effusion with compressive atelectasis in the left lower lobe. Stable appearance of the gastric pull-through post esophagectomy.  US Thoracentesis asp pleural space 09/19/2016 IMPRESSION: Successful ultrasound guided diagnostic and therapeutic left thoracentesis yielding 1.5 liters of pleural fluid.  ASSESSMENT & PLAN:  76 y.o. male presented with dysphagia with solid food  1. Distal esophageal adenocarcinoma, uT3N1M0, stage IIIA, ypT0N0M0, diffuse bone metastasis 08/2016 -I previously reviewed his CT scan, PET scan, EGD, and the biopsy findings with patient and his family member in details. -The initial PET scan showed no definitive distant metastasis. 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 This will be followed in the future scan. -By EUS, he had T3 N1 stage III disease. -He completed neoadjuvant radiation with weekly Carbo and Taxol. -I previously reviewed his surgical pathology findings, he has had complete pathologic response to neoadjuvant  chemotherapy and radiation, which predicts good prognosis -Unfortunately he developed diffuse bone metastasis in January 2018, confirmed  by bone marrow biopsy -He has started first-line chemotherapy FOLFOX, tolerating well, and his anemia and some cytopenia has previously improved since chemotherapy -The goal of therapy is palliative -I have requested HER2 IHC test but was not sufficient tissue -his tumor was negative for PD-L1 expression, he is not a candidate for immunotherapy -I previously reviewed his [facility] genomic testing results, unfortunately there is no good targeted therapy available. His tumor has MSI stable -He has clinically been doing well overall, has required blood transfusion (2 PRBCs) on 09/28/16 since he started chemotherapy, previously no significant noticeable side effects from chemotherapy, he has gained some weight. We will continue chemotherapy every 2 weeks. -We previously discussed his clinical response may be difficult to evaluate since his PET scan was negative when he had a bone metastasis, we'll based on the blood counts, and CEA level, also repeat PET scan every 3 months to evaluate his response. -CEA has decreased significant since starting FOLFOX in February 2018. - restaging PET Scan taken 11/12/16 were reviewed with pt and his family.  No concern for other new metastasis, his diffuse bone mets is difficult to evaluate on scan, but clinically he is doing well with CEA significantly dropping, corresponding to good response  -continue chemo every 2 weeks    2. 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 thrombocytopenia has resolved. -The patient had many blood transfusions with the last on 09/28/16, he has previously not required any blood transfusion since he started chemotherapy. -Continue monitoring CBC  3. 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  -He has had 3 repeated thoracentesis, all cytology were negative, -Therapeutic thoracentesis performed on 09/19/16 yielded  1.5 liters of blood-tinged fluid. Reactive appearing mesothelial cells were present. -Continue monitoring, previously repeated CXR -He was previously scheduled to follow-up with Dr. Pia Mau   4. HTN -Continue medication and follow-up of his primary care physician -his PCP at [facility] has changed his BP meds lately   5. Malnutrition, nausea/vomiting, and weight loss -previously Doing better, he is tolerating J-tube feeding very well, he eats normally. Encouraged the patient to eat small frequent meals. -12 feedings daily -Follow up with dietitian -Feeding tube site has mild bleeding. A picture was previously taken and sent to surgeon Dr. Servando Snare  6. Anorexia and depression  -continue mirtazapine. Overall, previously improved lately  -He previously hasn't talked to the nutritionist in a while, he will follow up   7. Fatigue -He would like something to help his energy. -We previously discussed steroids to help for this, but I told them that this is not a long term solution -He would not like steroids at this time.  -We discussed fatigue being related to chemo or anemia.  -I encouraged him to eat well with protein and be more active   8. Goal of care discussion  -We previously discussed the incurable nature of his cancer, and the overall poor prognosis, especially if he does not have good response to chemotherapy or progress on chemo -The patient understands the goal of care is palliative. -I recommend DNR/DNI, he will think about it   9. Prostate -He has an enlarged prostate, the recent PET scan showed some focal uptake in prostate,  and would like to get a PSA done -Will Schedule PSA by next visit    Plan -PET scan reviewed -  Lab reviewed, adequate for treatment today. Continue FOLFOX every 2 weeks. This is cycle 7 -lab, flush and chemo FOLFOX every 2 weeks X4 -f/u in 4 and 8 weeks -Call [facility] for extension authorization   All questions were answered. The patient knows to call the  clinic with any problems, questions or concerns.  I spent 25 minutes counseling the patient face to face. The total time spent in the appointment was 30 minutes and more than 50% was on counseling.   Truitt Merle, MD 11/28/2016   This document serves as a record of services personally performed by Truitt Merle, MD. It was created on her behalf by Joslyn Devon, a trained medical scribe. The creation of this record is based on the scribe's personal observations and the provider's statements to them. This document has been checked and approved by the attending provider.

## 2016-11-28 ENCOUNTER — Ambulatory Visit: Payer: No Typology Code available for payment source | Admitting: Nutrition

## 2016-11-28 ENCOUNTER — Other Ambulatory Visit (HOSPITAL_BASED_OUTPATIENT_CLINIC_OR_DEPARTMENT_OTHER): Payer: No Typology Code available for payment source

## 2016-11-28 ENCOUNTER — Telehealth: Payer: Self-pay | Admitting: Hematology

## 2016-11-28 ENCOUNTER — Encounter: Payer: Self-pay | Admitting: Hematology

## 2016-11-28 ENCOUNTER — Ambulatory Visit (HOSPITAL_BASED_OUTPATIENT_CLINIC_OR_DEPARTMENT_OTHER): Payer: No Typology Code available for payment source | Admitting: Hematology

## 2016-11-28 ENCOUNTER — Ambulatory Visit (HOSPITAL_BASED_OUTPATIENT_CLINIC_OR_DEPARTMENT_OTHER): Payer: No Typology Code available for payment source

## 2016-11-28 ENCOUNTER — Encounter: Payer: No Typology Code available for payment source | Admitting: Nutrition

## 2016-11-28 ENCOUNTER — Ambulatory Visit: Payer: No Typology Code available for payment source

## 2016-11-28 VITALS — BP 102/63 | HR 88 | Temp 98.9°F | Resp 18 | Ht 65.0 in | Wt 136.1 lb

## 2016-11-28 DIAGNOSIS — R5383 Other fatigue: Secondary | ICD-10-CM

## 2016-11-28 DIAGNOSIS — C155 Malignant neoplasm of lower third of esophagus: Secondary | ICD-10-CM

## 2016-11-28 DIAGNOSIS — Z5111 Encounter for antineoplastic chemotherapy: Secondary | ICD-10-CM

## 2016-11-28 DIAGNOSIS — E46 Unspecified protein-calorie malnutrition: Secondary | ICD-10-CM | POA: Diagnosis not present

## 2016-11-28 DIAGNOSIS — C7951 Secondary malignant neoplasm of bone: Secondary | ICD-10-CM

## 2016-11-28 DIAGNOSIS — Z125 Encounter for screening for malignant neoplasm of prostate: Secondary | ICD-10-CM

## 2016-11-28 LAB — COMPREHENSIVE METABOLIC PANEL
ALBUMIN: 3.3 g/dL — AB (ref 3.5–5.0)
ALK PHOS: 161 U/L — AB (ref 40–150)
ALT: 16 U/L (ref 0–55)
AST: 24 U/L (ref 5–34)
Anion Gap: 9 mEq/L (ref 3–11)
BILIRUBIN TOTAL: 0.45 mg/dL (ref 0.20–1.20)
BUN: 13.8 mg/dL (ref 7.0–26.0)
CO2: 25 mEq/L (ref 22–29)
Calcium: 9.4 mg/dL (ref 8.4–10.4)
Chloride: 104 mEq/L (ref 98–109)
Creatinine: 0.6 mg/dL — ABNORMAL LOW (ref 0.7–1.3)
EGFR: >90 mL/min/{1.73_m2} (ref >90)
GLUCOSE: 115 mg/dL (ref 70–140)
Potassium: 4.5 mEq/L (ref 3.5–5.1)
SODIUM: 138 meq/L (ref 136–145)
TOTAL PROTEIN: 6.5 g/dL (ref 6.4–8.3)

## 2016-11-28 LAB — CBC & DIFF AND RETIC
BASO%: 0.5 % (ref 0.0–2.0)
BASOS ABS: 0 10*3/uL (ref 0.0–0.1)
EOS%: 2.8 % (ref 0.0–7.0)
Eosinophils Absolute: 0.2 10*3/uL (ref 0.0–0.5)
HEMATOCRIT: 35.9 % — AB (ref 38.4–49.9)
HGB: 12.2 g/dL — ABNORMAL LOW (ref 13.0–17.1)
IMMATURE RETIC FRACT: 8.5 % (ref 3.00–10.60)
LYMPH%: 12.9 % — AB (ref 14.0–49.0)
MCH: 33.3 pg (ref 27.2–33.4)
MCHC: 34 g/dL (ref 32.0–36.0)
MCV: 98.1 fL — ABNORMAL HIGH (ref 79.3–98.0)
MONO#: 1 10*3/uL — ABNORMAL HIGH (ref 0.1–0.9)
MONO%: 18.2 % — ABNORMAL HIGH (ref 0.0–14.0)
NEUT#: 3.8 10*3/uL (ref 1.5–6.5)
NEUT%: 65.6 % (ref 39.0–75.0)
Platelets: 160 10*3/uL (ref 140–400)
RBC: 3.66 10*6/uL — AB (ref 4.20–5.82)
RDW: 15.3 % — ABNORMAL HIGH (ref 11.0–14.6)
RETIC %: 2.77 % — AB (ref 0.80–1.80)
RETIC CT ABS: 101.38 10*3/uL — AB (ref 34.80–93.90)
WBC: 5.7 10*3/uL (ref 4.0–10.3)
lymph#: 0.7 10*3/uL — ABNORMAL LOW (ref 0.9–3.3)

## 2016-11-28 MED ORDER — DEXTROSE 5 % IV SOLN
400.0000 mg/m2 | Freq: Once | INTRAVENOUS | Status: AC
  Administered 2016-11-28: 656 mg via INTRAVENOUS
  Filled 2016-11-28: qty 32.8

## 2016-11-28 MED ORDER — SODIUM CHLORIDE 0.9 % IV SOLN
2000.0000 mg/m2 | INTRAVENOUS | Status: DC
  Administered 2016-11-28: 3300 mg via INTRAVENOUS
  Filled 2016-11-28: qty 66

## 2016-11-28 MED ORDER — DEXAMETHASONE SODIUM PHOSPHATE 10 MG/ML IJ SOLN
INTRAMUSCULAR | Status: AC
Start: 2016-11-28 — End: 2016-11-28
  Filled 2016-11-28: qty 1

## 2016-11-28 MED ORDER — DEXTROSE 5 % IV SOLN
Freq: Once | INTRAVENOUS | Status: AC
Start: 2016-11-28 — End: 2016-11-28
  Administered 2016-11-28: 11:00:00 via INTRAVENOUS

## 2016-11-28 MED ORDER — PALONOSETRON HCL INJECTION 0.25 MG/5ML
0.2500 mg | Freq: Once | INTRAVENOUS | Status: AC
  Administered 2016-11-28: 0.25 mg via INTRAVENOUS

## 2016-11-28 MED ORDER — DEXAMETHASONE SODIUM PHOSPHATE 10 MG/ML IJ SOLN
10.0000 mg | Freq: Once | INTRAMUSCULAR | Status: AC
  Administered 2016-11-28: 10 mg via INTRAVENOUS

## 2016-11-28 MED ORDER — PALONOSETRON HCL INJECTION 0.25 MG/5ML
INTRAVENOUS | Status: AC
  Filled 2016-11-28: qty 5

## 2016-11-28 MED ORDER — OXALIPLATIN CHEMO INJECTION 100 MG/20ML
60.0000 mg/m2 | Freq: Once | INTRAVENOUS | Status: AC
  Administered 2016-11-28: 100 mg via INTRAVENOUS
  Filled 2016-11-28: qty 20

## 2016-11-28 NOTE — Patient Instructions (Signed)
[facility] Discharge Instructions for Patients Receiving Chemotherapy  Today you received the following chemotherapy agents: Oxaliplatin, Leucovorin and Adrucil.   To help prevent nausea and vomiting after your treatment, we encourage you to take your nausea medication as directed.  If you develop nausea and vomiting that is not controlled by your nausea medication, call the clinic.   BELOW ARE SYMPTOMS THAT SHOULD BE REPORTED IMMEDIATELY:  *FEVER GREATER THAN 100.5 F  *CHILLS WITH OR WITHOUT FEVER  NAUSEA AND VOMITING THAT IS NOT CONTROLLED WITH YOUR NAUSEA MEDICATION  *UNUSUAL SHORTNESS OF BREATH  *UNUSUAL BRUISING OR BLEEDING  TENDERNESS IN MOUTH AND THROAT WITH OR WITHOUT PRESENCE OF ULCERS  *URINARY PROBLEMS  *BOWEL PROBLEMS  UNUSUAL RASH Items with * indicate a potential emergency and should be followed up as soon as possible.  Feel free to call the clinic you have any questions or concerns. The clinic phone number is [phone].  Please show the CHEMO ALERT CARD at check-in to the Emergency Department and triage nurse.   

## 2016-11-28 NOTE — Progress Notes (Signed)
Nutrition follow-up completed with patient and wife, during chemotherapy. Patient is followed by dietitian at the [facility]. He is tolerating 4 cans of Nutren 1.5 continuous nighttime feedings with 50 mL free water every hour while tube feeding is infusing. Patient flushes his feeding tube with 120 mL free water before and after continuous feedings. Weight is stable and documented as 136.1 pounds on May 9. Patient denies nutrition impact symptoms. He continues to eat small amounts.  Nutrition diagnosis: Unintentional weight loss improved.  Estimated nutrition needs: 2030-2230 calories, 85-100 grams protein, 2.2 L fluid.  Intervention: Patient will continue Nutren 1.5 via J-tube as well as oral nutrition to maintain weight. Teach back method used.  Monitoring, evaluation, goals: Patient will continue weight maintenance with tolerance of continuous tube feeding and oral intake.  Next visit: Wednesday 26 during infusion.  **Disclaimer: This note was dictated with voice recognition software. Similar sounding words can inadvertently be transcribed and this note may contain transcription errors which may not have been corrected upon publication of note.**

## 2016-11-28 NOTE — Telephone Encounter (Signed)
Gave patient AVS and calender per 5/9 los.  

## 2016-11-30 ENCOUNTER — Ambulatory Visit (HOSPITAL_BASED_OUTPATIENT_CLINIC_OR_DEPARTMENT_OTHER): Payer: No Typology Code available for payment source

## 2016-11-30 VITALS — BP 100/50 | HR 78 | Temp 98.1°F | Resp 18

## 2016-11-30 DIAGNOSIS — C155 Malignant neoplasm of lower third of esophagus: Secondary | ICD-10-CM | POA: Diagnosis not present

## 2016-11-30 MED ORDER — HEPARIN SOD (PORK) LOCK FLUSH 100 UNIT/ML IV SOLN
500.0000 [IU] | Freq: Once | INTRAVENOUS | Status: AC | PRN
Start: 2016-11-30 — End: 2016-11-30
  Administered 2016-11-30: 500 [IU]
  Filled 2016-11-30: qty 5

## 2016-11-30 MED ORDER — SODIUM CHLORIDE 0.9% FLUSH
10.0000 mL | INTRAVENOUS | Status: DC | PRN
  Administered 2016-11-30: 10 mL
  Filled 2016-11-30: qty 10

## 2016-11-30 NOTE — Patient Instructions (Signed)
Implanted Port Home Guide An implanted port is a type of central line that is placed under the skin. Central lines are used to provide IV access when treatment or nutrition needs to be given through a person's veins. Implanted ports are used for long-term IV access. An implanted port may be placed because:  You need IV medicine that would be irritating to the small veins in your hands or arms.  You need long-term IV medicines, such as antibiotics.  You need IV nutrition for a long period.  You need frequent blood draws for lab tests.  You need dialysis.  Implanted ports are usually placed in the chest area, but they can also be placed in the upper arm, the abdomen, or the leg. An implanted port has two main parts:  Reservoir. The reservoir is round and will appear as a small, raised area under your skin. The reservoir is the part where a needle is inserted to give medicines or draw blood.  Catheter. The catheter is a thin, flexible tube that extends from the reservoir. The catheter is placed into a large vein. Medicine that is inserted into the reservoir goes into the catheter and then into the vein.  How will I care for my incision site? Do not get the incision site wet. Bathe or shower as directed by your health care provider. How is my port accessed? Special steps must be taken to access the port:  Before the port is accessed, a numbing cream can be placed on the skin. This helps numb the skin over the port site.  Your health care provider uses a sterile technique to access the port. ? Your health care provider must put on a mask and sterile gloves. ? The skin over your port is cleaned carefully with an antiseptic and allowed to dry. ? The port is gently pinched between sterile gloves, and a needle is inserted into the port.  Only "non-coring" port needles should be used to access the port. Once the port is accessed, a blood return should be checked. This helps ensure that the port  is in the vein and is not clogged.  If your port needs to remain accessed for a constant infusion, a clear (transparent) bandage will be placed over the needle site. The bandage and needle will need to be changed every week, or as directed by your health care provider.  Keep the bandage covering the needle clean and dry. Do not get it wet. Follow your health care provider's instructions on how to take a shower or bath while the port is accessed.  If your port does not need to stay accessed, no bandage is needed over the port.  What is flushing? Flushing helps keep the port from getting clogged. Follow your health care provider's instructions on how and when to flush the port. Ports are usually flushed with saline solution or a medicine called heparin. The need for flushing will depend on how the port is used.  If the port is used for intermittent medicines or blood draws, the port will need to be flushed: ? After medicines have been given. ? After blood has been drawn. ? As part of routine maintenance.  If a constant infusion is running, the port may not need to be flushed.  How long will my port stay implanted? The port can stay in for as long as your health care provider thinks it is needed. When it is time for the port to come out, surgery will be   done to remove it. The procedure is similar to the one performed when the port was put in. When should I seek immediate medical care? When you have an implanted port, you should seek immediate medical care if:  You notice a bad smell coming from the incision site.  You have swelling, redness, or drainage at the incision site.  You have more swelling or pain at the port site or the surrounding area.  You have a fever that is not controlled with medicine.  This information is not intended to replace advice given to you by your health care provider. Make sure you discuss any questions you have with your health care provider. Document  Released: 07/09/2005 Document Revised: 12/15/2015 Document Reviewed: 03/16/2013 [employer] Interactive Patient Education  2017 [employer]  

## 2016-12-12 ENCOUNTER — Ambulatory Visit (HOSPITAL_BASED_OUTPATIENT_CLINIC_OR_DEPARTMENT_OTHER): Payer: No Typology Code available for payment source

## 2016-12-12 ENCOUNTER — Ambulatory Visit: Payer: No Typology Code available for payment source

## 2016-12-12 ENCOUNTER — Other Ambulatory Visit: Payer: Self-pay | Admitting: Cardiothoracic Surgery

## 2016-12-12 ENCOUNTER — Other Ambulatory Visit (HOSPITAL_BASED_OUTPATIENT_CLINIC_OR_DEPARTMENT_OTHER): Payer: No Typology Code available for payment source

## 2016-12-12 VITALS — BP 115/56 | HR 85 | Temp 98.5°F | Resp 18

## 2016-12-12 DIAGNOSIS — Z5111 Encounter for antineoplastic chemotherapy: Secondary | ICD-10-CM | POA: Diagnosis not present

## 2016-12-12 DIAGNOSIS — C159 Malignant neoplasm of esophagus, unspecified: Secondary | ICD-10-CM

## 2016-12-12 DIAGNOSIS — C155 Malignant neoplasm of lower third of esophagus: Secondary | ICD-10-CM | POA: Diagnosis not present

## 2016-12-12 DIAGNOSIS — C7951 Secondary malignant neoplasm of bone: Secondary | ICD-10-CM

## 2016-12-12 DIAGNOSIS — Z95828 Presence of other vascular implants and grafts: Secondary | ICD-10-CM

## 2016-12-12 DIAGNOSIS — Z125 Encounter for screening for malignant neoplasm of prostate: Secondary | ICD-10-CM

## 2016-12-12 LAB — COMPREHENSIVE METABOLIC PANEL
ALT: 14 U/L (ref 0–55)
ANION GAP: 7 meq/L (ref 3–11)
AST: 24 U/L (ref 5–34)
Albumin: 3.2 g/dL — ABNORMAL LOW (ref 3.5–5.0)
Alkaline Phosphatase: 176 U/L — ABNORMAL HIGH (ref 40–150)
BUN: 13.3 mg/dL (ref 7.0–26.0)
CALCIUM: 9.5 mg/dL (ref 8.4–10.4)
CHLORIDE: 105 meq/L (ref 98–109)
CO2: 26 mEq/L (ref 22–29)
CREATININE: 0.6 mg/dL — AB (ref 0.7–1.3)
EGFR: >90 mL/min/{1.73_m2} (ref >90)
Glucose: 115 mg/dl (ref 70–140)
Potassium: 4.1 mEq/L (ref 3.5–5.1)
Sodium: 138 mEq/L (ref 136–145)
Total Bilirubin: 0.35 mg/dL (ref 0.20–1.20)
Total Protein: 6 g/dL — ABNORMAL LOW (ref 6.4–8.3)

## 2016-12-12 LAB — CBC & DIFF AND RETIC
BASO%: 0.2 % (ref 0.0–2.0)
Basophils Absolute: 0 10*3/uL (ref 0.0–0.1)
EOS%: 4.3 % (ref 0.0–7.0)
Eosinophils Absolute: 0.2 10*3/uL (ref 0.0–0.5)
HEMATOCRIT: 35 % — AB (ref 38.4–49.9)
HGB: 11.8 g/dL — ABNORMAL LOW (ref 13.0–17.1)
Immature Retic Fract: 8.4 % (ref 3.00–10.60)
LYMPH%: 18.1 % (ref 14.0–49.0)
MCH: 32.9 pg (ref 27.2–33.4)
MCHC: 33.7 g/dL (ref 32.0–36.0)
MCV: 97.5 fL (ref 79.3–98.0)
MONO#: 0.8 10*3/uL (ref 0.1–0.9)
MONO%: 17.6 % — AB (ref 0.0–14.0)
NEUT%: 59.8 % (ref 39.0–75.0)
NEUTROS ABS: 2.8 10*3/uL (ref 1.5–6.5)
Platelets: 142 10*3/uL (ref 140–400)
RBC: 3.59 10*6/uL — AB (ref 4.20–5.82)
RDW: 14.9 % — AB (ref 11.0–14.6)
RETIC %: 2.56 % — AB (ref 0.80–1.80)
Retic Ct Abs: 91.9 10*3/uL (ref 34.80–93.90)
WBC: 4.7 10*3/uL (ref 4.0–10.3)
lymph#: 0.8 10*3/uL — ABNORMAL LOW (ref 0.9–3.3)

## 2016-12-12 LAB — CEA (IN HOUSE-CHCC): CEA (CHCC-In House): 4.33 ng/mL (ref 0.00–5.00)

## 2016-12-12 MED ORDER — DEXTROSE 5 % IV SOLN
Freq: Once | INTRAVENOUS | Status: AC
  Administered 2016-12-12: 14:00:00 via INTRAVENOUS

## 2016-12-12 MED ORDER — SODIUM CHLORIDE 0.9 % IV SOLN
2000.0000 mg/m2 | INTRAVENOUS | Status: DC
  Administered 2016-12-12: 3300 mg via INTRAVENOUS
  Filled 2016-12-12: qty 66

## 2016-12-12 MED ORDER — DEXAMETHASONE SODIUM PHOSPHATE 10 MG/ML IJ SOLN
10.0000 mg | Freq: Once | INTRAMUSCULAR | Status: AC
  Administered 2016-12-12: 10 mg via INTRAVENOUS

## 2016-12-12 MED ORDER — LEUCOVORIN CALCIUM INJECTION 350 MG
400.0000 mg/m2 | Freq: Once | INTRAVENOUS | Status: AC
  Administered 2016-12-12: 656 mg via INTRAVENOUS
  Filled 2016-12-12: qty 32.8

## 2016-12-12 MED ORDER — PALONOSETRON HCL INJECTION 0.25 MG/5ML
0.2500 mg | Freq: Once | INTRAVENOUS | Status: AC
  Administered 2016-12-12: 0.25 mg via INTRAVENOUS

## 2016-12-12 MED ORDER — OXALIPLATIN CHEMO INJECTION 100 MG/20ML
60.0000 mg/m2 | Freq: Once | INTRAVENOUS | Status: AC
  Administered 2016-12-12: 100 mg via INTRAVENOUS
  Filled 2016-12-12: qty 20

## 2016-12-12 MED ORDER — DEXAMETHASONE SODIUM PHOSPHATE 10 MG/ML IJ SOLN
INTRAMUSCULAR | Status: AC
  Filled 2016-12-12: qty 1

## 2016-12-12 MED ORDER — PALONOSETRON HCL INJECTION 0.25 MG/5ML
INTRAVENOUS | Status: AC
  Filled 2016-12-12: qty 5

## 2016-12-12 MED ORDER — SODIUM CHLORIDE 0.9% FLUSH
10.0000 mL | INTRAVENOUS | Status: DC | PRN
  Administered 2016-12-12: 10 mL via INTRAVENOUS
  Filled 2016-12-12: qty 10

## 2016-12-12 NOTE — Patient Instructions (Signed)

## 2016-12-12 NOTE — Patient Instructions (Signed)
[facility] Discharge Instructions for Patients Receiving Chemotherapy  Today you received the following chemotherapy agents Oxaliplatin/Leucovorin/5 FU  To help prevent nausea and vomiting after your treatment, we encourage you to take your nausea medication as prescribed.   If you develop nausea and vomiting that is not controlled by your nausea medication, call the clinic.   BELOW ARE SYMPTOMS THAT SHOULD BE REPORTED IMMEDIATELY:  *FEVER GREATER THAN 100.5 F  *CHILLS WITH OR WITHOUT FEVER  NAUSEA AND VOMITING THAT IS NOT CONTROLLED WITH YOUR NAUSEA MEDICATION  *UNUSUAL SHORTNESS OF BREATH  *UNUSUAL BRUISING OR BLEEDING  TENDERNESS IN MOUTH AND THROAT WITH OR WITHOUT PRESENCE OF ULCERS  *URINARY PROBLEMS  *BOWEL PROBLEMS  UNUSUAL RASH Items with * indicate a potential emergency and should be followed up as soon as possible.  Feel free to call the clinic you have any questions or concerns. The clinic phone number is [phone].  Please show the CHEMO ALERT CARD at check-in to the Emergency Department and triage nurse.   

## 2016-12-13 ENCOUNTER — Encounter: Payer: Self-pay | Admitting: Cardiothoracic Surgery

## 2016-12-13 ENCOUNTER — Ambulatory Visit
Admission: RE | Admit: 2016-12-13 | Discharge: 2016-12-13 | Disposition: A | Discharge status: Home / Self Care | Payer: No Typology Code available for payment source | Source: Ambulatory Visit | Attending: Cardiothoracic Surgery | Admitting: Cardiothoracic Surgery

## 2016-12-13 ENCOUNTER — Ambulatory Visit (INDEPENDENT_AMBULATORY_CARE_PROVIDER_SITE_OTHER): Payer: No Typology Code available for payment source | Admitting: Cardiothoracic Surgery

## 2016-12-13 VITALS — BP 109/60 | HR 67 | Resp 16 | Ht 65.0 in | Wt 138.0 lb

## 2016-12-13 DIAGNOSIS — J90 Pleural effusion, not elsewhere classified: Secondary | ICD-10-CM | POA: Diagnosis not present

## 2016-12-13 DIAGNOSIS — C159 Malignant neoplasm of esophagus, unspecified: Secondary | ICD-10-CM | POA: Diagnosis not present

## 2016-12-13 DIAGNOSIS — Z09 Encounter for follow-up examination after completed treatment for conditions other than malignant neoplasm: Secondary | ICD-10-CM | POA: Diagnosis not present

## 2016-12-13 LAB — PSA: Prostate Specific Ag, Serum: 3 ng/mL (ref 0.0–4.0)

## 2016-12-13 NOTE — Progress Notes (Signed)
[address]       [place] [address]             [phone]      Nazir B Haxton [place] Medical Record #[id number] Date of Birth: 20-Jan-1941  Referring: Truitt Merle, MD Primary Care: [name], MD  Chief Complaint:   POST OP FOLLOW UP 04/23/2016 OPERATIVE REPORT PREOPERATIVE DIAGNOSIS:  Adenocarcinoma of the distal esophagus previously treated with radiation and chemotherapy, clinically staged at IIIA POSTOPERATIVE DIAGNOSIS:  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 SURGEON:  Lanelle Bal, M.D.  Cancer Staging Malignant neoplasm of lower third of esophagus [facility]) Staging form: Esophagus - Adenocarcinoma, AJCC 7th Edition - Clinical stage from 12/15/2015: Stage IIIA (T3, N1, M0) - Signed by Truitt Merle, MD on 12/24/2015 - Pathologic stage from 04/26/2016: yT0, N0, cM0, GX - Signed by [name], MD on 04/27/2016    History of Present Illness:     Patient returns to the office today in follow-up after esophageal resection. In February he had developed significant anemia requiring transfusions upper GI endoscopy and colonoscopy did not reveal any source of bleeding or recurrent tumor, bone marrow biopsy revealed metastatic adenocarcinoma. He's been started on chemotherapy and has had bone marrow function improved after the first cycle of chemotherapy. He continues to build a take a by mouth diet but this is supplemented with tube feedings night. He has not required any further blood transfusions. His respiratory status has been stable. He has had intermittent thoracentesis for left pleural effusion    Diagnosis Bone Marrow Biopsy, right iliac - METASTATIC ADENOCARCINOMA. - SEE COMMENT. PERIPHERAL BLOOD: -  NORMOCYTIC ANEMIA. - THROMBOCYTOPENIA.  PLEURAL FLUID, LEFT (SPECIMEN 1 OF 1 COLLECTED 07/04/16): REACTIVE APPEARING MESOTHELIAL CELLS. Enid Cutter MD Pathologist, Electronic Signature     Past Medical History:  Diagnosis Date  . Esophageal cancer ([place]) 11/23/15   lower 3rd esohagus   . GERD (gastroesophageal reflux disease)   . Hyperlipidemia   . Hypertension      History  Smoking Status  . Former Smoker  . Packs/day: 1.00  . Years: 10.00  . Quit date: 07/24/1967  Smokeless Tobacco  . Never Used    History  Alcohol Use No     Allergies  Allergen Reactions  . No Known Allergies     Current Outpatient Prescriptions  Medication Sig Dispense Refill  . acetaminophen (TYLENOL) 325 MG tablet Take 650 mg by mouth every 6 (six) hours as needed.    Marland Kitchen atenolol (TENORMIN) 25 MG tablet Take 1 tab in the morning and half tab in the evening (Patient taking differently: Take 12.5-25 mg by mouth 2 (two) times daily. Take 80m in the morning and 12.551min the evening) 60 tablet 1  . folic acid (FOLVITE) 1 MG tablet Take 1 tablet (1 mg total) by mouth daily. 30 tablet 0  . lidocaine-prilocaine (EMLA) cream Apply 1 application topically as needed. Apply 1-2 tsp over port site 1 hour prior to chemotherapy 30 g 1  . mirtazapine (REMERON) 15 MG tablet Take 1 tablet (15 mg total) by mouth at bedtime. 30 tablet 2  . Nutritional Supplements (NUTREN 1.5) LIQD Take 250 mLs by mouth at bedtime. 4 CANS VIA FEEDING TUBE    . ondansetron (ZOFRAN ODT) 8 MG disintegrating tablet Take 1  tablet (8 mg total) by mouth every 8 (eight) hours as needed for nausea or vomiting. 30 tablet 2  . oxycodone (OXY-IR) 5 MG capsule Take 5 mg by mouth every 6 (six) hours as needed for pain.     Marland Kitchen prochlorperazine (COMPAZINE) 10 MG tablet Take 1 tablet (10 mg total) by mouth every 6 (six) hours as needed for nausea or vomiting. 30 tablet 3  . vitamin B-12 1000 MCG tablet Take 1 tablet (1,000 mcg total) by mouth daily. 30  tablet 0  . Water For Irrigation, Sterile (FREE WATER) SOLN Place 220 mLs into feeding tube 4 (four) times daily.     No current facility-administered medications for this visit.        Physical Exam: BP 109/60 (BP Location: Left Arm, Patient Position: Sitting, Cuff Size: Normal)   Pulse 67   Resp 16   Ht 5' 5"  (1.651 m)   Wt 138 lb (62.6 kg)   SpO2 98% Comment: ON RA  BMI 22.96 kg/m   Wt Readings from Last 3 Encounters:  12/13/16 138 lb (62.6 kg)  11/28/16 136 lb 1.6 oz (61.7 kg)  11/01/16 136 lb 3.2 oz (61.8 kg)    General appearance: alert and cooperative Neurologic: intact Heart: regular rate and rhythm, S1, S2 normal, no murmur, click, rub or gallop Lungs: diminished breath sounds LLL Abdomen: soft, non-tender; bowel sounds normal; no masses,  no organomegaly, abdominal incision is intact Extremities: extremities normal, atraumatic, no cyanosis or edema and Homans sign is negative, no sign of DVT Wound: Neck and abdominal incision are well-healed, feeding tube is intact. There is significant amount of granulation tissue around the feeding jejunostomy tube , this was cauterized with silver nitrate today   patient has no cervical supraclavicular adenopathy  Diagnostic Studies & Laboratory data:     Recent Radiology Findings:   Dg Chest 2 View  Result Date: 12/13/2016 CLINICAL DATA:  Esophageal cancer. Pleural effusion. Some shortness of breath. EXAM: CHEST  2 VIEW COMPARISON:  Chest radiograph 11/02/2016.  PET-CT 11/12/2016. FINDINGS: Right jugular Port-A-Cath terminates over the mid SVC, unchanged. Cardiomediastinal silhouette is unchanged. A moderate size left pleural effusion appears larger than on the prior radiograph but may not be substantially changed in overall volume compared to the interval PET-CT. There is associated left basilar atelectasis. The right lung is clear. A trace right pleural effusion is again seen. No pneumothorax is identified. A percutaneous  jejunostomy tube is partially visualized. No acute osseous abnormality is seen. IMPRESSION: Moderate left pleural effusion, increased in size from 11/02/2016 chest radiograph. Left basilar atelectasis. Electronically Signed   By: Logan Bores M.D.   On: 12/13/2016 11:14       Recent Lab Findings: Lab Results  Component Value Date   WBC 4.7 12/12/2016   HGB 11.8 (L) 12/12/2016   HCT 35.0 (L) 12/12/2016   PLT 142 12/12/2016   GLUCOSE 115 12/12/2016   ALT 14 12/12/2016   AST 24 12/12/2016   NA 138 12/12/2016   K 4.1 12/12/2016   CL 108 08/31/2016   CREATININE 0.6 (L) 12/12/2016   BUN 13.3 12/12/2016   CO2 26 12/12/2016   TSH 2.422 05/03/2016   INR 1.42 08/28/2016   Wt Readings from Last 3 Encounters:  12/13/16 138 lb (62.6 kg)  11/28/16 136 lb 1.6 oz (61.7 kg)  11/01/16 136 lb 3.2 oz (61.8 kg)      Assessment / Plan:   Patient remains stable clinically with known stage IV esophageal  cancer, currently is tolerating therapy, maintaining blood count and not requiring repeated blood transfusions He has a moderate left pleural effusion which has not increased, I plan to see him back in one month with a follow-up chest x-ray   [name] MD      [address] [place] [address] Office [phone]   Beeper [phone]  12/13/2016 1:05 PM

## 2016-12-14 ENCOUNTER — Ambulatory Visit (HOSPITAL_BASED_OUTPATIENT_CLINIC_OR_DEPARTMENT_OTHER): Payer: No Typology Code available for payment source

## 2016-12-14 DIAGNOSIS — Z452 Encounter for adjustment and management of vascular access device: Secondary | ICD-10-CM

## 2016-12-14 DIAGNOSIS — Z95828 Presence of other vascular implants and grafts: Secondary | ICD-10-CM

## 2016-12-14 DIAGNOSIS — C155 Malignant neoplasm of lower third of esophagus: Secondary | ICD-10-CM | POA: Diagnosis not present

## 2016-12-14 MED ORDER — SODIUM CHLORIDE 0.9% FLUSH
10.0000 mL | INTRAVENOUS | Status: DC | PRN
  Administered 2016-12-14: 10 mL via INTRAVENOUS
  Filled 2016-12-14: qty 10

## 2016-12-14 MED ORDER — HEPARIN SOD (PORK) LOCK FLUSH 100 UNIT/ML IV SOLN
500.0000 [IU] | Freq: Once | INTRAVENOUS | Status: AC
  Administered 2016-12-14: 500 [IU] via INTRAVENOUS
  Filled 2016-12-14: qty 5

## 2016-12-14 NOTE — Patient Instructions (Signed)

## 2016-12-20 NOTE — Progress Notes (Signed)
[place]  [phone] [phone]  Clinic Follow up Note   Patient Care Team: [name], MD as PCP - General (Internal Medicine) Truitt Merle, MD as Consulting Physician (Hematology) Kyung Rudd, MD as Consulting Physician (Radiation Oncology) [name], MD as Consulting Physician (Cardiothoracic Surgery) Karie Mainland, RD as Dietitian (Nutrition) Minus Breeding, MD as Consulting Physician (Cardiology)   CHIEF COMPLAINTS:  Follow up metastatic esophageal cancer    Oncology History   Presented with solid dysphagia at least daily with chest pain. Involuntary weight loss of 10-12 lb over past year  Malignant neoplasm of lower third of esophagus (HCC)   Staging form: Esophagus - Adenocarcinoma, AJCC 7th Edition   - Clinical stage from 12/15/2015: Stage IIIA (T3, N1, M0) - Signed by Truitt Merle, MD on 12/24/2015   - Pathologic stage from 04/26/2016: yT0, N0, cM0, GX - Signed by [name], MD on 04/27/2016      Malignant neoplasm of lower third of esophagus ([place])   11/23/2015 Procedure    EGD per [facility](Dr. Toledo):6cm mass in lower third of esophagus      11/24/2015 Initial Diagnosis    Esophageal cancer ([place])      11/24/2015 Pathology Results    Mucinous adenocarcinoma, moderately differentiated-Her2 analysis pending      12/05/2015 Imaging    PET scan showed hypermetabolic a mass in distal esophagus, hypermetabolic activity in the left anterior prostate gland, metabolic density in left adrenal nodule, CT attenuation suggest a benign adrenal adenoma. Left apical 2 mm lung nodule.      12/15/2015 Procedure    EUS showed a uT3N1 lesion from 27cm to 35cm from the incisors       12/26/2015 - 02/02/2016 Chemotherapy    Weekly carboplatin AUC 2 and Taxol 45 mg/m, with concurrent radiation      12/26/2015 - 02/02/2016 Radiation Therapy    Neoadjuvant chemoradiation to the esophageal cancer      04/23/2016  Surgery    total esophagectomy with cervical esophagogastrostomy, pyloroplasty, feeding jejunostomy       04/23/2016 Pathology Results    Esophagectomy showed no residual tumor, 8 lymph nodes were all negative, incidental leiomyoma 0.2 cm      08/14/2016 PET scan    IMPRESSION: 1. Small hypermetabolic left adrenal mass, this has not changed in size compared to 12/01/15 but has a maximum standard uptake value of 6.2 (previously 4.2). Indeterminate density and MRI characteristics, not specific for adenoma. Early metastatic disease is not readily excluded although the lack of change of size is somewhat reassuring. Surveillance recommended. 2. No other hypermetabolic lesions are identified. 3. Moderate to large left pleural effusion with passive atelectasis. 4. Esophagectomy with gastric pull-through. 5. No hypermetabolic liver lesions are seen. 6. Enlarged prostate gland.      08/16/2016 - 08/23/2016 Hospital Admission    He was diagnosed with DIC, unclear etiology. He was given blood products. He underwent echocardiogram which was negative for endocarditis, CTA chest/abd which was negative for aneurysm or dissection as work up for DIC. He was on prednisone for some time, but stopped as it was not helping much for DIC.       08/28/2016 - 09/01/2016 Hospital Admission    He has had issues with recurrent L sided pleural effusions, DIC and melena. He was last admitted in 1/25, given supportive care with a thoracentesis and blood transfusion. Dr Burr Medico has been following DIC and has requested a direct admit for melena,  anemia and thrombocytopenia. GI called for EGD.        08/31/2016 Pathology Results    Bone Marrow Biopsy, right iliac - METASTATIC ADENOCARCINOMA. - SEE COMMENT. PERIPHERAL BLOOD: - NORMOCYTIC ANEMIA. - THROMBOCYTOPENIA.      08/31/2016 Progression    Bone marrow biopsy showed metastatic adenocarcinoma      09/04/2016 -  Chemotherapy    FOLFOX every 2 weeks       09/19/2016  Procedure    Therapeutic thoracentesis of a left pleural effusion performed on 09/19/16 yielded 1.5 liters of blood-tinged fluid. Reactive appearing mesothelial cells were present.       HISTORY OF PRESENTING ILLNESS (12/14/2015):  Fred [name] 76 y.o. male is here because of His newly diagnosed esophageal cancer. He is accompanied by his wife and daughter to my clinic today.  He has had dysphagia for 2 months, mainly with solid food, no dysphagia with soft food or liquid. He also has mild pain in mid chest when he swallows. No nausea, abdominal pain, or other discomfort. He has lost about 15 lbs in the last year, no other symoptoms. He was seen by his primary care physician at [facility], and referred to gastroenterologist Dr. Alice Reichert. He underwent EGD on 11/23/2015, which showed a 6 cm mass in the lower third of esophagus. Biopsy showed adenocarcinoma, moderately differentiated. He was referred to [place] to consider neoadjuvant therapy. He is scheduled to see a Psychologist, sport and exercise at [facility] later this week.  He has had some urinary frequency, underwent TURP on 11/02/2015.   CURRENT THERAPY: first line chemo FOLFOX every 2 weeks started on 09/04/2016  INTERIM HISTORY:  Mr. Maysonet returns for follow up and 9th cycle chemo. He presents to the clinic today in the infusion room with his wife. He reports no trouble with chemo as far as he can tell. No numbness and tingling. He is drinking 3 cans of ensure in the evening and eating during the day. He is doing a little more around the hlme. He has some aches in the left arm. They are still waiting to hear from [place] about reimbursing the insurance company.     MEDICAL HISTORY:  Past Medical History:  Diagnosis Date  . Esophageal cancer ([place]) 11/23/15   lower 3rd esohagus   . GERD (gastroesophageal reflux disease)   . Hyperlipidemia   . Hypertension     SURGICAL HISTORY: Past Surgical History:  Procedure Laterality Date  . CHEST TUBE INSERTION Left 04/23/2016     Procedure: CHEST TUBE INSERTION;  Surgeon: [name], MD;  Location: [place];  Service: Thoracic;  Laterality: Left;  . COLONOSCOPY N/A 08/30/2016   Procedure: COLONOSCOPY;  Surgeon: Milus Banister, MD;  Location: [facility];  Service: Endoscopy;  Laterality: N/A;  . COMPLETE ESOPHAGECTOMY N/A 04/23/2016   Procedure: TRANSHIATIAL TOTAL ESOPHAGECTOMY COMPLETE; CERVICAL ESOPHAGOGASTROSTOMY AND PYLOROPLASTY;  Surgeon: [name], MD;  Location: [place];  Service: Thoracic;  Laterality: N/A;  . cystoscope  4/12/1   prostate  . ERCP    . ESOPHAGOGASTRODUODENOSCOPY (EGD) WITH PROPOFOL N/A 08/29/2016   Procedure: ESOPHAGOGASTRODUODENOSCOPY (EGD) WITH PROPOFOL;  Surgeon: Milus Banister, MD;  Location: [facility];  Service: Endoscopy;  Laterality: N/A;  . INGUINAL HERNIA REPAIR    . IR GENERIC HISTORICAL  08/31/2016   IR FLUORO GUIDE PORT INSERTION RIGHT 08/31/2016 Corrie Mckusick, DO [facility]-INTER[facility] RAD  . IR GENERIC HISTORICAL  08/31/2016   IR US GUIDE VASC ACCESS RIGHT 08/31/2016 Corrie Mckusick, DO [facility]  RAD  . JEJUNOSTOMY N/A 04/23/2016   Procedure: FEEDING JEJUNOSTOMY;  Surgeon: [name], MD;  Location: [place];  Service: Thoracic;  Laterality: N/A;  . LEG SURGERY Left    hole  . PROSTATE BIOPSY  10/05/15  . right shoulder surgery    . VIDEO BRONCHOSCOPY N/A 04/23/2016   Procedure: VIDEO BRONCHOSCOPY;  Surgeon: [name], MD;  Location: [facility] OR;  Service: Thoracic;  Laterality: N/A;    SOCIAL HISTORY: Social History   Social History  . Marital status: Married    Spouse name: N/A  . Number of children: 1  . Years of education: N/A   Occupational History  . Truck Geophysicist/field seismologist    Social History Main Topics  . Smoking status: Former Smoker    Packs/day: 1.00    Years: 10.00    Quit date: 07/24/1967  . Smokeless tobacco: Never Used  . Alcohol use No  . Drug use: No  . Sexual activity: Not Currently   Other Topics Concern  . Not on file   Social History Narrative  . No  narrative on file    FAMILY HISTORY: Family History  Problem Relation Age of Onset  . Cancer Maternal Grandfather 26       gastric cancer   . Alzheimer's disease Mother   . Diabetes Mother   . Stroke Father 31  . Multiple sclerosis Sister     ALLERGIES:  is allergic to no known allergies.  MEDICATIONS:  Current Outpatient Prescriptions  Medication Sig Dispense Refill  . acetaminophen (TYLENOL) 325 MG tablet Take 650 mg by mouth every 6 (six) hours as needed.    Marland Kitchen atenolol (TENORMIN) 25 MG tablet Take 1 tab in the morning and half tab in the evening (Patient taking differently: Take 12.5-25 mg by mouth 2 (two) times daily. Take 72m in the morning and 12.528min the evening) 60 tablet 1  . folic acid (FOLVITE) 1 MG tablet Take 1 tablet (1 mg total) by mouth daily. 30 tablet 0  . lidocaine-prilocaine (EMLA) cream Apply 1 application topically as needed. Apply 1-2 tsp over port site 1 hour prior to chemotherapy 30 g 1  . mirtazapine (REMERON) 15 MG tablet Take 1 tablet (15 mg total) by mouth at bedtime. 30 tablet 2  . Nutritional Supplements (NUTREN 1.5) LIQD Take 250 mLs by mouth at bedtime. 3 CANS VIA FEEDING TUBE    . ondansetron (ZOFRAN ODT) 8 MG disintegrating tablet Take 1 tablet (8 mg total) by mouth every 8 (eight) hours as needed for nausea or vomiting. 30 tablet 2  . oxycodone (OXY-IR) 5 MG capsule Take 5 mg by mouth every 6 (six) hours as needed for pain.     . Marland Kitchenrochlorperazine (COMPAZINE) 10 MG tablet Take 1 tablet (10 mg total) by mouth every 6 (six) hours as needed for nausea or vomiting. 30 tablet 3  . vitamin B-12 1000 MCG tablet Take 1 tablet (1,000 mcg total) by mouth daily. 30 tablet 0  . Water For Irrigation, Sterile (FREE WATER) SOLN Place 220 mLs into feeding tube 4 (four) times daily.     No current facility-administered medications for this visit.     REVIEW OF SYSTEMS:   Constitutional: Denies fevers, chills or abnormal night sweats (+) loss of energy (+)  fatigue Eyes: Denies blurriness of vision, double vision or watery eyes Ears, nose, mouth, throat, and face: Denies mucositis or sore throat Respiratory: Denies cough, dyspnea or wheezes Cardiovascular: Denies palpitation, chest discomfort or  lower extremity swelling Gastrointestinal:  Denies heartburn or change in bowel habits (+) Drainage around J-tube. (+) some bleeding from feeding tube (+)  Nausea  Skin: Denies abnormal skin rashes Lymphatics: Denies new lymphadenopathy or easy bruising Neurological:Denies numbness, tingling or new weaknesses Behavioral/Psych: Mood is stable, no new changes  Musculoskeletal: (+) L hip pain (+) left arm aches All other systems were reviewed with the patient and are negative.  PHYSICAL EXAMINATION: ECOG PERFORMANCE STATUS: 2  Vitals:   12/26/16 1023  BP: (!) 105/55  Pulse: 78  Resp: 20  Temp: 98.2 F (36.8 C)  TempSrc: Oral  SpO2: 100%  Weight: 138 lb 4.8 oz (62.7 kg)  Height: 5' 5" (1.651 m)    GENERAL:alert, no distress and comfortable, in wheelchair SKIN: Appears to be pale, skin texture, turgor are normal, no rashes or significant lesions EYES: normal, conjunctiva are pink and non-injected, sclera clear OROPHARYNX:no exudate, no erythema and lips, buccal mucosa, and tongue normal  NECK: supple, thyroid normal size, non-tender, without nodularity LYMPH:  no palpable lymphadenopathy in the cervical, axillary or inguinal LUNGS: clear to auscultation and percussion with normal breathing effort (+) except decreased breath sound at the left lung base. HEART: regular rate & rhythm and no murmurs and no lower extremity edema ABDOMEN:abdomen soft, non-tender and normal bowel sounds, J tube in place. No skin erythema or pus. (+) Hepatomegaly, liver is palpable 2-3 cm below the rib cage, nontender. (+) Feeding tube site with small bleeding around feeding tube site, minimal amount of yellowish secretion. No skin erythema around J tube. Normal mucosa    Musculoskeletal:no cyanosis of digits and no clubbing  PSYCH: alert & oriented x 3 with fluent speech NEURO: no focal motor/sensory deficits  LABORATORY DATA:  I have reviewed the data as listed CBC Latest Ref Rng & Units 12/26/2016 12/12/2016 11/28/2016  WBC 4.0 - 10.3 10e3/uL 5.0 4.7 5.7  Hemoglobin 13.0 - 17.1 g/dL 11.5(L) 11.8(L) 12.2(L)  Hematocrit 38.4 - 49.9 % 34.0(L) 35.0(L) 35.9(L)  Platelets 140 - 400 10e3/uL 148 142 160   CMP Latest Ref Rng & Units 12/26/2016 12/12/2016 11/28/2016  Glucose 70 - 140 mg/dl 139 115 115  BUN 7.0 - 26.0 mg/dL 13.1 13.3 13.8  Creatinine 0.7 - 1.3 mg/dL 0.6(L) 0.6(L) 0.6(L)  Sodium 136 - 145 mEq/L 138 138 138  Potassium 3.5 - 5.1 mEq/L 4.3 4.1 4.5  Chloride 101 - 111 mmol/L - - -  CO2 22 - 29 mEq/L _0 Calcium 8.4 - 10.4 mg/dL 9.0 9.5 9.4  Total Protein 6.4 - 8.3 g/dL 6.0(L) 6.0(L) 6.5  Total Bilirubin 0.20 - 1.20 mg/dL 0.31 0.35 0.45  Alkaline Phos 40 - 150 U/L 159(H) 176(H) 161(H)  AST 5 - 34 U/L _1 ALT 0 - 55 U/L _2 Results for GRIFFITH, SANTILLI (MRN [id number]) as of 12/20/2016 13:14  Ref. Range 11/14/2016 11:04 12/12/2016 12:01 12/12/2016 12:02  CEA ([facility]In House) Latest Ref Range: 0.00 - 5.00 ng/mL 12.19 (H) 4.33   PSA Latest Ref Range: 0.0 - 4.0 ng/mL   3.0     Pathology report [facility] TESTING 09/29/2016   PD-L1 Testing 09/29/2016   Diagnosis 09/27/2016 Consult- Comprehensive, [id number] - 1A Mass, Lower third of Esophagus, Mucosal Biopsy - ADENOCARCINOMA WITH EXTRACELLULAR MUCIN. - SEE COMMENT. Microscopic Comment Provided Her2 IHC with the appropriate controls is negative. Per report Her2 FISH is also negative. There is limited tissue remaining, but Foundation One and PDL-1  testing will be attempted as requested.  Diagnosis 09/19/2016 PLEURAL FLUID, LEFT (SPECIMEN 1 OF 1 COLLECTED 09/19/16): REACTIVE APPEARING MESOTHELIAL CELLS PRESENT.  Diagnosis 08/31/2016 Bone Marrow Biopsy, right iliac - METASTATIC  ADENOCARCINOMA. - SEE COMMENT. PERIPHERAL BLOOD: - NORMOCYTIC ANEMIA. - THROMBOCYTOPENIA.  Diagnosis 08/02/16 PLEURAL FLUID, LEFT (SPECIMEN 1 OF 1 COLLECTED 08/02/16): REACTIVE MESOTHELIAL CELLS PRESENT. LYMPHOCYTES PRESENT.  Diagnosis 11/23/2015 Mass, lower third of the esophagus, mucosal biopsy Mucinous adenocarcinoma, moderately differentiated.  Diagnosis 04/23/2016 1. Lymph node, biopsy, Cervical - ONE OF ONE LYMPH NODES NEGATIVE FOR CARCINOMA (0/1). 2. Lymph node, biopsy, Periesophageal - BLOOD WITH SCANT BENIGN SOFT TISSUE AND SKELETAL MUSCLE. - NO MALIGNANCY IDENTIFIED. 3. Esophagogastrectomy - ULCERATION WITH INFLAMMATION AND REACTIVE CHANGES. - FOCAL INTESTINAL METAPLASIA. - CHANGES CONSISTENT WITH TREATMENT EFFECT. - SEVEN OF SEVEN LYMPH NODES NEGATIVE FOR CARCINOMA (0/7). - INCIDENTAL LEIOMYOMA, 0.2 CM. - MARGINS ARE NEGATIVE FOR DYSPLASIA OR MALIGNANCY. - SEE ONCOLOGY TABLE. Microscopic Comment 3. ESOPHAGUS: Specimen: Esophagus and proximal stomach, cervical lymph node. Procedure: Esophagogastrectomy and cervical lymph node biopsy. Tumor Site: Presumed GE junction, see comment. Relationship of Tumor to esophagogastric junction: Can not be assessed. Distance of tumor center from esophagogastric junction: Can not be assessed. Tumor Size Greatest dimension: No residual tumor present. Histologic Type: Per medical record adenocarcinoma (no biopsy for review). Histologic Grade: N/A. Microscopic Tumor Extension: N/A. Margins: Negative. Treatment Effect: Present (TRS 0). Lymph-Vascular Invasion: Not identified. Perineural Invasion: Not identified. Lymph nodes: number examined 8; number positive: 0 TNM: ypT0, ypN0 see comment. 1 of 3 FINAL for LUCUS, LAMBERTSON ([id number]) Microscopic Comment(continued) Ancillary studies: None. Comments: The entire GE junction is submitted for evaluation. There is ulceration with associated inflammation. There is acellular  mucin/myxoid changes which extend through the muscularis propria, but no viable/residual tumor is identified and thus the stage is ypT0. There is focal background intestinal metaplasia, which may have been pre-existing (Barrett's esophagus) or related to therapy.   RADIOGRAPHIC STUDIES: I have personally reviewed the radiological images as listed and agreed with the findings in the report. Dg Chest 2 View  Result Date: 12/13/2016 CLINICAL DATA:  Esophageal cancer. Pleural effusion. Some shortness of breath. EXAM: CHEST  2 VIEW COMPARISON:  Chest radiograph 11/02/2016.  PET-CT 11/12/2016. FINDINGS: Right jugular Port-A-Cath terminates over the mid SVC, unchanged. Cardiomediastinal silhouette is unchanged. A moderate size left pleural effusion appears larger than on the prior radiograph but may not be substantially changed in overall volume compared to the interval PET-CT. There is associated left basilar atelectasis. The right lung is clear. A trace right pleural effusion is again seen. No pneumothorax is identified. A percutaneous jejunostomy tube is partially visualized. No acute osseous abnormality is seen. IMPRESSION: Moderate left pleural effusion, increased in size from 11/02/2016 chest radiograph. Left basilar atelectasis. Electronically Signed   By: Logan Bores M.D.   On: 12/13/2016 11:14   PET 08/14/2016 IMPRESSION: 1. Small hypermetabolic left adrenal mass, this has not changed in size compared to 12/01/15 but has a maximum standard uptake value of 6.2 (previously 4.2). Indeterminate density and MRI characteristics, not specific for adenoma. Early metastatic disease is not readily excluded although the lack of change of size is somewhat reassuring. Surveillance recommended. 2. No other hypermetabolic lesions are identified. 3. Moderate to large left pleural effusion with passive atelectasis. 4. Esophagectomy with gastric pull-through. 5. No hypermetabolic liver lesions are seen. 6.  Enlarged prostate gland.  CT angio chest/abdomen/pelvis for dissection w/wo contrast 08/22/2016 IMPRESSION: No evidence of aortic aneurysm or dissection.  No evidence of pulmonary embolus. Heart is borderline in size. Stable small left adrenal nodule which was hypermetabolic on prior PET CT. Stable large left pleural effusion with compressive atelectasis in the left lower lobe. Stable appearance of the gastric pull-through post esophagectomy.  US Thoracentesis asp pleural space 09/19/2016 IMPRESSION: Successful ultrasound guided diagnostic and therapeutic left thoracentesis yielding 1.5 liters of pleural fluid.  ASSESSMENT & PLAN:  76 y.o. male presented with dysphagia with solid food  1. Distal esophageal adenocarcinoma, uT3N1M0, stage IIIA, ypT0N0M0, diffuse bone metastasis 08/2016 -I previously reviewed his CT scan, PET scan, EGD, and the biopsy findings with patient and his family member in details. -The initial PET scan showed no definitive distant metastasis. 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 This will be followed in the future scan. -By EUS, he had T3 N1 stage III disease. -He completed neoadjuvant radiation with weekly Carbo and Taxol. -I previously reviewed his surgical pathology findings, he has had complete pathologic response to neoadjuvant chemotherapy and radiation, which predicts good prognosis -Unfortunately he developed diffuse bone metastasis in January 2018, confirmed by bone marrow biopsy -He has started first-line chemotherapy FOLFOX, tolerating well, and his anemia and some cytopenia has previously improved since chemotherapy -The goal of therapy is palliative -I have requested HER2 IHC test but was not sufficient tissue -his tumor was negative for PD-L1 expression, he is not a candidate for immunotherapy -I previously reviewed his [facility] One genomic testing results, unfortunately there is no  good targeted therapy available. His tumor has MSI stable -He has clinically been doing well overall, has required blood transfusion (2 PRBCs) on 09/28/16 since he started chemotherapy, previously no significant noticeable side effects from chemotherapy, he has gained some weight. We will continue chemotherapy every 2 weeks. -We previously discussed his clinical response may be difficult to evaluate since his PET scan was negative when he had a bone metastasis, we'll based on the blood counts, and CEA level, also repeat PET scan every 3 months to evaluate his response. -CEA has decreased significant since starting FOLFOX in February 2018. - restaging PET Scan taken 11/12/16 were reviewed with pt and his family.  No concern for other new metastasis, his diffuse bone mets is difficult to evaluate on scan, but clinically he is doing well with CEA significantly dropping, corresponding to good response  -continue chemo every 2 weeks  -Labs adequate for treatment today, he is clinically doing well, tolerating chemo very well, will continue    2. 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 thrombocytopenia has resolved. -The patient had many blood transfusions with the last on 09/28/16, he has not required any blood transfusion since he started chemotherapy. -Continue monitoring CBC  3. 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  -He has had 3 repeated thoracentesis, all cytology were negative, -Therapeutic thoracentesis performed on 09/19/16 yielded 1.5 liters of blood-tinged fluid. Reactive appearing mesothelial cells were present. -Continue monitoring, previously repeated CXR -He was previously scheduled to follow-up with Dr. Pia Mau   4. HTN -Continue medication and follow-up of his primary care physician -his PCP at [facility] has changed his BP meds lately   5. Malnutrition -Doing  better, he is tolerating J-tube feeding very well, he eats normally. Encouraged the patient to eat small frequent meals. -he has decreased his tube feeds lately  -Follow up with dietitian -Feeding tube site is clean and  no bleeding lately   6. Anorexia and depression  -continue mirtazapine. Overall, previously improved lately  -He previously hasn't talked to the nutritionist in a while, he will follow up   7. Fatigue -He would like something to help his energy. -We previously discussed steroids to help for this, but I told them that this is not a long term solution -He would not like steroids at this time.  -We previously discussed fatigue being related to chemo or anemia.  -I previously encouraged him to eat well with protein and be more active   8. Goal of care discussion  -We previously discussed the incurable nature of his cancer, and the overall poor prognosis, especially if he does not have good response to chemotherapy or progress on chemo -The patient understands the goal of care is palliative. -I recommend DNR/DNI, he will think about it   9. Prostate -He has an enlarged prostate, the recent PET scan showed some focal uptake in prostate -PSA normal on 12/12/2016, no suspicion for prostate cancer   Plan -Lab reviewed, adequate for treatment today. Continue FOLFOX every 2 weeks.  -labs, flush and f/u in 2 and 4 weeks  -f/u in 4 weeks     All questions were answered. The patient knows to call the clinic with any problems, questions or concerns.  I spent 25 minutes counseling the patient face to face. The total time spent in the appointment was 30 minutes and more than 50% was on counseling.   Truitt Merle, MD 12/26/2016   This document serves as a record of services personally performed by Truitt Merle, MD. It was created on her behalf by Joslyn Devon, a trained medical scribe. The creation of this record is based on the scribe's personal observations and the provider's statements  to them. This document has been checked and approved by the attending provider.

## 2016-12-26 ENCOUNTER — Ambulatory Visit (HOSPITAL_BASED_OUTPATIENT_CLINIC_OR_DEPARTMENT_OTHER): Payer: No Typology Code available for payment source | Admitting: Hematology

## 2016-12-26 ENCOUNTER — Ambulatory Visit: Payer: No Typology Code available for payment source

## 2016-12-26 ENCOUNTER — Ambulatory Visit (HOSPITAL_BASED_OUTPATIENT_CLINIC_OR_DEPARTMENT_OTHER): Payer: No Typology Code available for payment source

## 2016-12-26 ENCOUNTER — Ambulatory Visit: Payer: No Typology Code available for payment source | Admitting: Nutrition

## 2016-12-26 ENCOUNTER — Other Ambulatory Visit (HOSPITAL_BASED_OUTPATIENT_CLINIC_OR_DEPARTMENT_OTHER): Payer: No Typology Code available for payment source

## 2016-12-26 VITALS — BP 105/55 | HR 78 | Temp 98.2°F | Resp 20 | Ht 65.0 in | Wt 138.3 lb

## 2016-12-26 DIAGNOSIS — Z5111 Encounter for antineoplastic chemotherapy: Secondary | ICD-10-CM

## 2016-12-26 DIAGNOSIS — C7951 Secondary malignant neoplasm of bone: Secondary | ICD-10-CM | POA: Diagnosis not present

## 2016-12-26 DIAGNOSIS — C61 Malignant neoplasm of prostate: Secondary | ICD-10-CM | POA: Diagnosis not present

## 2016-12-26 DIAGNOSIS — R1310 Dysphagia, unspecified: Secondary | ICD-10-CM | POA: Diagnosis not present

## 2016-12-26 DIAGNOSIS — I10 Essential (primary) hypertension: Secondary | ICD-10-CM

## 2016-12-26 DIAGNOSIS — C155 Malignant neoplasm of lower third of esophagus: Secondary | ICD-10-CM

## 2016-12-26 DIAGNOSIS — E440 Moderate protein-calorie malnutrition: Secondary | ICD-10-CM

## 2016-12-26 LAB — COMPREHENSIVE METABOLIC PANEL
ALBUMIN: 3.1 g/dL — AB (ref 3.5–5.0)
ALT: 13 U/L (ref 0–55)
AST: 22 U/L (ref 5–34)
Alkaline Phosphatase: 159 U/L — ABNORMAL HIGH (ref 40–150)
Anion Gap: 9 mEq/L (ref 3–11)
BILIRUBIN TOTAL: 0.31 mg/dL (ref 0.20–1.20)
BUN: 13.1 mg/dL (ref 7.0–26.0)
CALCIUM: 9 mg/dL (ref 8.4–10.4)
CHLORIDE: 106 meq/L (ref 98–109)
CO2: 23 mEq/L (ref 22–29)
CREATININE: 0.6 mg/dL — AB (ref 0.7–1.3)
EGFR: >90 mL/min/{1.73_m2} (ref >90)
Glucose: 139 mg/dl (ref 70–140)
Potassium: 4.3 mEq/L (ref 3.5–5.1)
Sodium: 138 mEq/L (ref 136–145)
TOTAL PROTEIN: 6 g/dL — AB (ref 6.4–8.3)

## 2016-12-26 LAB — CBC & DIFF AND RETIC
BASO%: 0.4 % (ref 0.0–2.0)
BASOS ABS: 0 10*3/uL (ref 0.0–0.1)
EOS%: 2.4 % (ref 0.0–7.0)
Eosinophils Absolute: 0.1 10*3/uL (ref 0.0–0.5)
HEMATOCRIT: 34 % — AB (ref 38.4–49.9)
HEMOGLOBIN: 11.5 g/dL — AB (ref 13.0–17.1)
Immature Retic Fract: 10.3 % (ref 3.00–10.60)
LYMPH#: 1.1 10*3/uL (ref 0.9–3.3)
LYMPH%: 21.1 % (ref 14.0–49.0)
MCH: 33 pg (ref 27.2–33.4)
MCHC: 33.8 g/dL (ref 32.0–36.0)
MCV: 97.7 fL (ref 79.3–98.0)
MONO#: 0.9 10*3/uL (ref 0.1–0.9)
MONO%: 17.1 % — ABNORMAL HIGH (ref 0.0–14.0)
NEUT#: 2.9 10*3/uL (ref 1.5–6.5)
NEUT%: 59 % (ref 39.0–75.0)
PLATELETS: 148 10*3/uL (ref 140–400)
RBC: 3.48 10*6/uL — ABNORMAL LOW (ref 4.20–5.82)
RDW: 15 % — ABNORMAL HIGH (ref 11.0–14.6)
RETIC %: 2.88 % — AB (ref 0.80–1.80)
RETIC CT ABS: 100.22 10*3/uL — AB (ref 34.80–93.90)
WBC: 5 10*3/uL (ref 4.0–10.3)
nRBC: 0 % (ref 0–0)

## 2016-12-26 MED ORDER — SODIUM CHLORIDE 0.9% FLUSH
10.0000 mL | INTRAVENOUS | Status: DC | PRN
  Filled 2016-12-26: qty 10

## 2016-12-26 MED ORDER — PALONOSETRON HCL INJECTION 0.25 MG/5ML
INTRAVENOUS | Status: AC
  Filled 2016-12-26: qty 5

## 2016-12-26 MED ORDER — DEXAMETHASONE SODIUM PHOSPHATE 10 MG/ML IJ SOLN
INTRAMUSCULAR | Status: AC
  Filled 2016-12-26: qty 1

## 2016-12-26 MED ORDER — LEUCOVORIN CALCIUM INJECTION 350 MG
400.0000 mg/m2 | Freq: Once | INTRAVENOUS | Status: AC
  Administered 2016-12-26: 656 mg via INTRAVENOUS
  Filled 2016-12-26: qty 32.8

## 2016-12-26 MED ORDER — PALONOSETRON HCL INJECTION 0.25 MG/5ML
0.2500 mg | Freq: Once | INTRAVENOUS | Status: AC
  Administered 2016-12-26: 0.25 mg via INTRAVENOUS

## 2016-12-26 MED ORDER — DEXAMETHASONE SODIUM PHOSPHATE 10 MG/ML IJ SOLN
10.0000 mg | Freq: Once | INTRAMUSCULAR | Status: AC
Start: 2016-12-26 — End: 2016-12-26
  Administered 2016-12-26: 10 mg via INTRAVENOUS

## 2016-12-26 MED ORDER — SODIUM CHLORIDE 0.9 % IV SOLN
2000.0000 mg/m2 | INTRAVENOUS | Status: DC
  Administered 2016-12-26: 3300 mg via INTRAVENOUS
  Filled 2016-12-26: qty 66

## 2016-12-26 MED ORDER — DEXTROSE 5 % IV SOLN
Freq: Once | INTRAVENOUS | Status: AC
  Administered 2016-12-26: 11:00:00 via INTRAVENOUS

## 2016-12-26 MED ORDER — SODIUM CHLORIDE 0.9 % IJ SOLN
10.0000 mL | Freq: Once | INTRAMUSCULAR | Status: AC
  Administered 2016-12-26: 10 mL via INTRAVENOUS
  Filled 2016-12-26: qty 10

## 2016-12-26 MED ORDER — OXALIPLATIN CHEMO INJECTION 100 MG/20ML
60.0000 mg/m2 | Freq: Once | INTRAVENOUS | Status: AC
  Administered 2016-12-26: 100 mg via INTRAVENOUS
  Filled 2016-12-26: qty 20

## 2016-12-26 MED ORDER — HEPARIN SOD (PORK) LOCK FLUSH 100 UNIT/ML IV SOLN
500.0000 [IU] | Freq: Once | INTRAVENOUS | Status: DC | PRN
  Filled 2016-12-26: qty 5

## 2016-12-26 NOTE — Progress Notes (Signed)
Nutrition follow-up completed with patient and wife, during chemotherapy for esophageal cancer. Patient reports tolerating 3 cans Nutren 1.5 with 600 mL free water via jejunostomy tube. He is eating more. Weight has improved and was documented as 138.3 pounds on June 6. Patient denies nutrition impact symptoms.  Nutrition diagnosis: Unintentional weight loss improved.  Intervention:  Patient will continue Nutren 1.5 via J-tube as well as oral nutrition to maintain weight. Teach back method used.  Monitoring, evaluation, goals:  Patient will tolerate oral intake plus tube feeding for weight maintenance.  Next visit:  To be scheduled as needed.  Patient and wife have my contact information for any questions or concerns.  **Disclaimer: This note was dictated with voice recognition software. Similar sounding words can inadvertently be transcribed and this note may contain transcription errors which may not have been corrected upon publication of note.**

## 2016-12-26 NOTE — Patient Instructions (Signed)
[facility] Discharge Instructions for Patients Receiving Chemotherapy  Today you received the following chemotherapy agents Eloxatin, Lecovorin, Adrucil  To help prevent nausea and vomiting after your treatment, we encourage you to take your nausea medication as directed If you develop nausea and vomiting that is not controlled by your nausea medication, call the clinic.   BELOW ARE SYMPTOMS THAT SHOULD BE REPORTED IMMEDIATELY:  *FEVER GREATER THAN 100.5 F  *CHILLS WITH OR WITHOUT FEVER  NAUSEA AND VOMITING THAT IS NOT CONTROLLED WITH YOUR NAUSEA MEDICATION  *UNUSUAL SHORTNESS OF BREATH  *UNUSUAL BRUISING OR BLEEDING  TENDERNESS IN MOUTH AND THROAT WITH OR WITHOUT PRESENCE OF ULCERS  *URINARY PROBLEMS  *BOWEL PROBLEMS  UNUSUAL RASH Items with * indicate a potential emergency and should be followed up as soon as possible.  Feel free to call the clinic you have any questions or concerns. The clinic phone number is [phone].  Please show the [place] at check-in to the Emergency Department and triage nurse.

## 2016-12-26 NOTE — Patient Instructions (Signed)

## 2016-12-28 ENCOUNTER — Ambulatory Visit (HOSPITAL_BASED_OUTPATIENT_CLINIC_OR_DEPARTMENT_OTHER): Payer: No Typology Code available for payment source

## 2016-12-28 ENCOUNTER — Telehealth: Payer: Self-pay | Admitting: Hematology

## 2016-12-28 VITALS — BP 103/53 | HR 80 | Temp 98.6°F | Resp 18

## 2016-12-28 DIAGNOSIS — C155 Malignant neoplasm of lower third of esophagus: Secondary | ICD-10-CM | POA: Diagnosis not present

## 2016-12-28 DIAGNOSIS — Z452 Encounter for adjustment and management of vascular access device: Secondary | ICD-10-CM | POA: Diagnosis not present

## 2016-12-28 MED ORDER — SODIUM CHLORIDE 0.9% FLUSH
10.0000 mL | INTRAVENOUS | Status: DC | PRN
  Administered 2016-12-28: 10 mL
  Filled 2016-12-28: qty 10

## 2016-12-28 MED ORDER — HEPARIN SOD (PORK) LOCK FLUSH 100 UNIT/ML IV SOLN
500.0000 [IU] | Freq: Once | INTRAVENOUS | Status: AC | PRN
  Administered 2016-12-28: 500 [IU]
  Filled 2016-12-28: qty 5

## 2016-12-28 NOTE — Telephone Encounter (Signed)
Per 6/6 no los 

## 2016-12-29 ENCOUNTER — Encounter: Payer: Self-pay | Admitting: Hematology

## 2017-01-09 ENCOUNTER — Ambulatory Visit: Payer: No Typology Code available for payment source

## 2017-01-09 ENCOUNTER — Other Ambulatory Visit (HOSPITAL_BASED_OUTPATIENT_CLINIC_OR_DEPARTMENT_OTHER): Payer: No Typology Code available for payment source

## 2017-01-09 ENCOUNTER — Ambulatory Visit (HOSPITAL_BASED_OUTPATIENT_CLINIC_OR_DEPARTMENT_OTHER): Payer: No Typology Code available for payment source

## 2017-01-09 VITALS — BP 110/54 | HR 82 | Temp 98.4°F | Resp 18

## 2017-01-09 DIAGNOSIS — Z5111 Encounter for antineoplastic chemotherapy: Secondary | ICD-10-CM

## 2017-01-09 DIAGNOSIS — C155 Malignant neoplasm of lower third of esophagus: Secondary | ICD-10-CM | POA: Diagnosis not present

## 2017-01-09 DIAGNOSIS — Z95828 Presence of other vascular implants and grafts: Secondary | ICD-10-CM

## 2017-01-09 LAB — CBC & DIFF AND RETIC
BASO%: 0.2 % (ref 0.0–2.0)
Basophils Absolute: 0 10*3/uL (ref 0.0–0.1)
EOS ABS: 0.1 10*3/uL (ref 0.0–0.5)
EOS%: 2 % (ref 0.0–7.0)
HCT: 35.4 % — ABNORMAL LOW (ref 38.4–49.9)
HGB: 12.2 g/dL — ABNORMAL LOW (ref 13.0–17.1)
IMMATURE RETIC FRACT: 10.3 % (ref 3.00–10.60)
LYMPH#: 1 10*3/uL (ref 0.9–3.3)
LYMPH%: 18.8 % (ref 14.0–49.0)
MCH: 33.2 pg (ref 27.2–33.4)
MCHC: 34.5 g/dL (ref 32.0–36.0)
MCV: 96.2 fL (ref 79.3–98.0)
MONO#: 0.9 10*3/uL (ref 0.1–0.9)
MONO%: 18.4 % — ABNORMAL HIGH (ref 0.0–14.0)
NEUT#: 3.1 10*3/uL (ref 1.5–6.5)
NEUT%: 60.6 % (ref 39.0–75.0)
NRBC: 0 % (ref 0–0)
PLATELETS: 156 10*3/uL (ref 140–400)
RBC: 3.68 10*6/uL — AB (ref 4.20–5.82)
RDW: 14.7 % — AB (ref 11.0–14.6)
RETIC %: 2.66 % — AB (ref 0.80–1.80)
RETIC CT ABS: 97.89 10*3/uL — AB (ref 34.80–93.90)
WBC: 5.1 10*3/uL (ref 4.0–10.3)

## 2017-01-09 LAB — COMPREHENSIVE METABOLIC PANEL
ALBUMIN: 3.2 g/dL — AB (ref 3.5–5.0)
ALK PHOS: 161 U/L — AB (ref 40–150)
ALT: 15 U/L (ref 0–55)
AST: 25 U/L (ref 5–34)
Anion Gap: 11 mEq/L (ref 3–11)
BUN: 10.2 mg/dL (ref 7.0–26.0)
CALCIUM: 9.3 mg/dL (ref 8.4–10.4)
CO2: 23 mEq/L (ref 22–29)
Chloride: 106 mEq/L (ref 98–109)
Creatinine: 0.6 mg/dL — ABNORMAL LOW (ref 0.7–1.3)
Glucose: 112 mg/dl (ref 70–140)
POTASSIUM: 4.3 meq/L (ref 3.5–5.1)
Sodium: 141 mEq/L (ref 136–145)
Total Bilirubin: 0.4 mg/dL (ref 0.20–1.20)
Total Protein: 6.4 g/dL (ref 6.4–8.3)

## 2017-01-09 LAB — CEA (IN HOUSE-CHCC): CEA (CHCC-IN HOUSE): 2.88 ng/mL (ref 0.00–5.00)

## 2017-01-09 MED ORDER — SODIUM CHLORIDE 0.9% FLUSH
10.0000 mL | INTRAVENOUS | Status: DC | PRN
  Administered 2017-01-09: 10 mL via INTRAVENOUS
  Filled 2017-01-09: qty 10

## 2017-01-09 MED ORDER — PALONOSETRON HCL INJECTION 0.25 MG/5ML
0.2500 mg | Freq: Once | INTRAVENOUS | Status: AC
  Administered 2017-01-09: 0.25 mg via INTRAVENOUS

## 2017-01-09 MED ORDER — SODIUM CHLORIDE 0.9 % IV SOLN
2000.0000 mg/m2 | INTRAVENOUS | Status: DC
  Administered 2017-01-09: 3300 mg via INTRAVENOUS
  Filled 2017-01-09: qty 66

## 2017-01-09 MED ORDER — DEXTROSE 5 % IV SOLN
Freq: Once | INTRAVENOUS | Status: AC
  Administered 2017-01-09: 11:00:00 via INTRAVENOUS

## 2017-01-09 MED ORDER — OXALIPLATIN CHEMO INJECTION 100 MG/20ML
60.0000 mg/m2 | Freq: Once | INTRAVENOUS | Status: AC
  Administered 2017-01-09: 100 mg via INTRAVENOUS
  Filled 2017-01-09: qty 20

## 2017-01-09 MED ORDER — DEXAMETHASONE SODIUM PHOSPHATE 10 MG/ML IJ SOLN
10.0000 mg | Freq: Once | INTRAMUSCULAR | Status: AC
  Administered 2017-01-09: 10 mg via INTRAVENOUS

## 2017-01-09 MED ORDER — LEUCOVORIN CALCIUM INJECTION 350 MG
400.0000 mg/m2 | Freq: Once | INTRAVENOUS | Status: AC
  Administered 2017-01-09: 656 mg via INTRAVENOUS
  Filled 2017-01-09: qty 32.8

## 2017-01-09 MED ORDER — PALONOSETRON HCL INJECTION 0.25 MG/5ML
INTRAVENOUS | Status: AC
  Filled 2017-01-09: qty 5

## 2017-01-09 MED ORDER — DEXAMETHASONE SODIUM PHOSPHATE 10 MG/ML IJ SOLN
INTRAMUSCULAR | Status: AC
  Filled 2017-01-09: qty 1

## 2017-01-09 NOTE — Patient Instructions (Signed)
Implanted Port Home Guide An implanted port is a type of central line that is placed under the skin. Central lines are used to provide IV access when treatment or nutrition needs to be given through a person's veins. Implanted ports are used for long-term IV access. An implanted port may be placed because:  You need IV medicine that would be irritating to the small veins in your hands or arms.  You need long-term IV medicines, such as antibiotics.  You need IV nutrition for a long period.  You need frequent blood draws for lab tests.  You need dialysis.  Implanted ports are usually placed in the chest area, but they can also be placed in the upper arm, the abdomen, or the leg. An implanted port has two main parts:  Reservoir. The reservoir is round and will appear as a small, raised area under your skin. The reservoir is the part where a needle is inserted to give medicines or draw blood.  Catheter. The catheter is a thin, flexible tube that extends from the reservoir. The catheter is placed into a large vein. Medicine that is inserted into the reservoir goes into the catheter and then into the vein.  How will I care for my incision site? Do not get the incision site wet. Bathe or shower as directed by your health care provider. How is my port accessed? Special steps must be taken to access the port:  Before the port is accessed, a numbing cream can be placed on the skin. This helps numb the skin over the port site.  Your health care provider uses a sterile technique to access the port. ? Your health care provider must put on a mask and sterile gloves. ? The skin over your port is cleaned carefully with an antiseptic and allowed to dry. ? The port is gently pinched between sterile gloves, and a needle is inserted into the port.  Only "non-coring" port needles should be used to access the port. Once the port is accessed, a blood return should be checked. This helps ensure that the port  is in the vein and is not clogged.  If your port needs to remain accessed for a constant infusion, a clear (transparent) bandage will be placed over the needle site. The bandage and needle will need to be changed every week, or as directed by your health care provider.  Keep the bandage covering the needle clean and dry. Do not get it wet. Follow your health care provider's instructions on how to take a shower or bath while the port is accessed.  If your port does not need to stay accessed, no bandage is needed over the port.  What is flushing? Flushing helps keep the port from getting clogged. Follow your health care provider's instructions on how and when to flush the port. Ports are usually flushed with saline solution or a medicine called heparin. The need for flushing will depend on how the port is used.  If the port is used for intermittent medicines or blood draws, the port will need to be flushed: ? After medicines have been given. ? After blood has been drawn. ? As part of routine maintenance.  If a constant infusion is running, the port may not need to be flushed.  How long will my port stay implanted? The port can stay in for as long as your health care provider thinks it is needed. When it is time for the port to come out, surgery will be   done to remove it. The procedure is similar to the one performed when the port was put in. When should I seek immediate medical care? When you have an implanted port, you should seek immediate medical care if:  You notice a bad smell coming from the incision site.  You have swelling, redness, or drainage at the incision site.  You have more swelling or pain at the port site or the surrounding area.  You have a fever that is not controlled with medicine.  This information is not intended to replace advice given to you by your health care provider. Make sure you discuss any questions you have with your health care provider. Document  Released: 07/09/2005 Document Revised: 12/15/2015 Document Reviewed: 03/16/2013 [employer] Interactive Patient Education  2017 [employer]  

## 2017-01-09 NOTE — Patient Instructions (Signed)
[facility] Discharge Instructions for Patients Receiving Chemotherapy  Today you received the following chemotherapy agents Oxaliplatin, Leucovorin, Adrucil  To help prevent nausea and vomiting after your treatment, we encourage you to take your nausea medication    If you develop nausea and vomiting that is not controlled by your nausea medication, call the clinic.   BELOW ARE SYMPTOMS THAT SHOULD BE REPORTED IMMEDIATELY:  *FEVER GREATER THAN 100.5 F  *CHILLS WITH OR WITHOUT FEVER  NAUSEA AND VOMITING THAT IS NOT CONTROLLED WITH YOUR NAUSEA MEDICATION  *UNUSUAL SHORTNESS OF BREATH  *UNUSUAL BRUISING OR BLEEDING  TENDERNESS IN MOUTH AND THROAT WITH OR WITHOUT PRESENCE OF ULCERS  *URINARY PROBLEMS  *BOWEL PROBLEMS  UNUSUAL RASH Items with * indicate a potential emergency and should be followed up as soon as possible.  Feel free to call the clinic you have any questions or concerns. The clinic phone number is [phone].  Please show the CHEMO ALERT CARD at check-in to the Emergency Department and triage nurse.   

## 2017-01-11 ENCOUNTER — Ambulatory Visit (HOSPITAL_BASED_OUTPATIENT_CLINIC_OR_DEPARTMENT_OTHER): Payer: No Typology Code available for payment source

## 2017-01-11 VITALS — BP 122/60 | HR 107 | Temp 98.2°F | Resp 18

## 2017-01-11 DIAGNOSIS — C155 Malignant neoplasm of lower third of esophagus: Secondary | ICD-10-CM | POA: Diagnosis not present

## 2017-01-11 MED ORDER — HEPARIN SOD (PORK) LOCK FLUSH 100 UNIT/ML IV SOLN
250.0000 [IU] | Freq: Once | INTRAVENOUS | Status: AC | PRN
Start: 2017-01-11 — End: 2017-01-11
  Administered 2017-01-11: 500 [IU]
  Filled 2017-01-11: qty 5

## 2017-01-11 MED ORDER — SODIUM CHLORIDE 0.9% FLUSH
10.0000 mL | INTRAVENOUS | Status: DC | PRN
  Administered 2017-01-11: 10 mL
  Filled 2017-01-11: qty 10

## 2017-01-14 ENCOUNTER — Other Ambulatory Visit: Payer: Self-pay | Admitting: Cardiothoracic Surgery

## 2017-01-14 DIAGNOSIS — C155 Malignant neoplasm of lower third of esophagus: Secondary | ICD-10-CM

## 2017-01-17 ENCOUNTER — Encounter: Payer: Self-pay | Admitting: Cardiothoracic Surgery

## 2017-01-17 ENCOUNTER — Ambulatory Visit
Admission: RE | Admit: 2017-01-17 | Discharge: 2017-01-17 | Disposition: A | Discharge status: Home / Self Care | Payer: No Typology Code available for payment source | Source: Ambulatory Visit | Attending: Cardiothoracic Surgery | Admitting: Cardiothoracic Surgery

## 2017-01-17 ENCOUNTER — Ambulatory Visit (INDEPENDENT_AMBULATORY_CARE_PROVIDER_SITE_OTHER): Payer: No Typology Code available for payment source | Admitting: Cardiothoracic Surgery

## 2017-01-17 ENCOUNTER — Telehealth: Payer: Self-pay | Admitting: *Deleted

## 2017-01-17 VITALS — BP 109/68 | HR 100 | Resp 20 | Ht 65.0 in | Wt 136.6 lb

## 2017-01-17 DIAGNOSIS — C159 Malignant neoplasm of esophagus, unspecified: Secondary | ICD-10-CM | POA: Diagnosis not present

## 2017-01-17 DIAGNOSIS — C155 Malignant neoplasm of lower third of esophagus: Secondary | ICD-10-CM

## 2017-01-17 DIAGNOSIS — J90 Pleural effusion, not elsewhere classified: Secondary | ICD-10-CM

## 2017-01-17 NOTE — Progress Notes (Signed)
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 SURGEON:  Lanelle Bal, M.D.  Cancer Staging Malignant neoplasm of lower third of esophagus [facility]) Staging form: Esophagus - Adenocarcinoma, AJCC 7th Edition - Clinical stage from 12/15/2015: Stage IIIA (T3, N1, M0) - Signed by Truitt Merle, MD on 12/24/2015 - Pathologic stage from 04/26/2016: yT0, N0, cM0, GX - Signed by [name], MD on 04/27/2016    History of Present Illness:     Patient returns to the office today in follow-up after esophageal resection. In February he had developed significant anemia requiring transfusions upper GI endoscopy and colonoscopy did not reveal any source of bleeding or recurrent tumor, bone marrow biopsy revealed metastatic adenocarcinoma. He's been started on chemotherapy and has had bone marrow function improved after the first cycle of chemotherapy. He continues to build a take a by mouth diet but this is supplemented with tube feedings night. He has not required any further blood transfusions. His respiratory status has been stable. He has had intermittent thoracentesis for left pleural effusion    Diagnosis Bone Marrow Biopsy, right iliac - METASTATIC ADENOCARCINOMA. - SEE COMMENT. PERIPHERAL BLOOD: -  NORMOCYTIC ANEMIA. - THROMBOCYTOPENIA.  PLEURAL FLUID, LEFT (SPECIMEN 1 OF 1 COLLECTED 07/04/16): REACTIVE APPEARING MESOTHELIAL CELLS. Enid Cutter MD Pathologist, Electronic Signature     Past Medical History:  Diagnosis Date  . Esophageal cancer ([place]) 11/23/15   lower 3rd esohagus   . GERD (gastroesophageal reflux disease)   . Hyperlipidemia   . Hypertension      History  Smoking Status  . Former Smoker  . Packs/day: 1.00  . Years: 10.00  . Quit date: 07/24/1967  Smokeless Tobacco  . Never Used    History  Alcohol Use No     Allergies  Allergen Reactions  . No Known Allergies     Current Outpatient Prescriptions  Medication Sig Dispense Refill  . acetaminophen (TYLENOL) 325 MG tablet Take 650 mg by mouth every 6 (six) hours as needed.    Marland Kitchen atenolol (TENORMIN) 25 MG tablet Take 1 tab in the morning and half tab in the evening (Patient taking differently: Take 12.5-25 mg by mouth 2 (two) times daily. Take 70m in the morning and 12.572min the evening) 60 tablet 1  . folic acid (FOLVITE) 1 MG tablet Take 1 tablet (1 mg total) by mouth daily. 30 tablet 0  . lidocaine-prilocaine (EMLA) cream Apply 1 application topically as needed. Apply 1-2 tsp over port site 1 hour prior to chemotherapy 30 g 1  . mirtazapine (REMERON) 15 MG tablet Take 1 tablet (15 mg total) by mouth at bedtime. 30 tablet 2  . Nutritional Supplements (NUTREN 1.5) LIQD Take 250 mLs by mouth at bedtime. 3 CANS VIA FEEDING TUBE    . ondansetron (ZOFRAN ODT) 8 MG disintegrating tablet Take 1  tablet (8 mg total) by mouth every 8 (eight) hours as needed for nausea or vomiting. 30 tablet 2  . oxycodone (OXY-IR) 5 MG capsule Take 5 mg by mouth every 6 (six) hours as needed for pain.     Marland Kitchen prochlorperazine (COMPAZINE) 10 MG tablet Take 1 tablet (10 mg total) by mouth every 6 (six) hours as needed for nausea or vomiting. 30 tablet 3  . vitamin B-12 1000 MCG tablet Take 1 tablet (1,000 mcg total) by mouth daily. 30  tablet 0  . Water For Irrigation, Sterile (FREE WATER) SOLN Place 220 mLs into feeding tube 4 (four) times daily.     No current facility-administered medications for this visit.        Physical Exam: BP 109/68   Pulse 100   Resp 20   Ht 5' 5"  (1.651 m)   Wt 136 lb 9.6 oz (62 kg)   SpO2 98% Comment: RA  BMI 22.73 kg/m   Wt Readings from Last 3 Encounters:  01/17/17 136 lb 9.6 oz (62 kg)  12/26/16 138 lb 4.8 oz (62.7 kg)  12/13/16 138 lb (62.6 kg)    General appearance: Alert and cooperative appears well Neurologic: intact Heart: Regular rate and rhythm without murmur gallop  Lungs: Diminished breath sounds left lower lobe Abdomen: Abdomen soft without tenderness or ascites Extremities: Extremities were out swelling or evidence of DVT  Wound: Wounds are healing well.  patient has no cervical supraclavicular adenopathy   Diagnostic Studies & Laboratory data:     Recent Radiology Findings:   Dg Chest 2 View  Result Date: 01/17/2017 CLINICAL DATA:  Esophageal cancer treated with radiation and chemotherapy. Evaluate pleural effusion. EXAM: CHEST  2 VIEW COMPARISON:  12/13/2016 and previous FINDINGS: Power port inserted on the right has its tip in the SVC above the right atrium. Right chest again shows minimal blunting of the posterior costophrenic angle but is otherwise clear. On the left, there is a moderate effusion layering dependently, similar to the previous exam, probably slightly reduced. Dependent pulmonary atelectasis associated with that. No acute bone finding. Partial compression fracture of L1 as seen previously. IMPRESSION: Persistent left effusion, probably slightly smaller than on the previous exam. Tiny amount of fluid in the posterior costophrenic angle on the right. Electronically Signed   By: Nelson Chimes M.D.   On: 01/17/2017 16:12     I have independently reviewed the above radiology studies  and reviewed the findings with the patient.   Recent Lab  Findings: Lab Results  Component Value Date   WBC 5.1 01/09/2017   HGB 12.2 (L) 01/09/2017   HCT 35.4 (L) 01/09/2017   PLT 156 01/09/2017   GLUCOSE 112 01/09/2017   ALT 15 01/09/2017   AST 25 01/09/2017   NA 141 01/09/2017   K 4.3 01/09/2017   CL 108 08/31/2016   CREATININE 0.6 (L) 01/09/2017   BUN 10.2 01/09/2017   CO2 23 01/09/2017   TSH 2.422 05/03/2016   INR 1.42 08/28/2016   Wt Readings from Last 3 Encounters:  01/17/17 136 lb 9.6 oz (62 kg)  12/26/16 138 lb 4.8 oz (62.7 kg)  12/13/16 138 lb (62.6 kg)      Assessment / Plan:    Stable moderate left pleural effusion slightly decreased- follow-up chest x-ray 2 months Patient range clinically stable with known stage IV esophageal cancer currently tolerating every other week chemotherapy.   [name] MD      [place]  [employer] [address] Office [place] [phone]  01/17/2017 5:18 PM

## 2017-01-17 NOTE — Telephone Encounter (Addendum)
"  I need to speak with Darlena or Shelia about my husband's billing.  Insurance was billed and the V.A. was not.  Freda Munro knows what I'm talking about.  [facility] is to reimburse the insurance."  Managed Care Team Lead not in today.  Provided Billing number [phone].

## 2017-01-22 NOTE — Progress Notes (Signed)
[place]  [phone] [phone]  Clinic Follow up Note   Patient Care Team: [name], MD as PCP - General (Internal Medicine) Truitt Merle, MD as Consulting Physician (Hematology) Kyung Rudd, MD as Consulting Physician (Radiation Oncology) [name], MD as Consulting Physician (Cardiothoracic Surgery) Karie Mainland, RD as Dietitian (Nutrition) Minus Breeding, MD as Consulting Physician (Cardiology)   CHIEF COMPLAINTS:  Follow up metastatic esophageal cancer    Oncology History   Presented with solid dysphagia at least daily with chest pain. Involuntary weight loss of 10-12 lb over past year  Malignant neoplasm of lower third of esophagus (HCC)   Staging form: Esophagus - Adenocarcinoma, AJCC 7th Edition   - Clinical stage from 12/15/2015: Stage IIIA (T3, N1, M0) - Signed by Truitt Merle, MD on 12/24/2015   - Pathologic stage from 04/26/2016: yT0, N0, cM0, GX - Signed by [name], MD on 04/27/2016      Malignant neoplasm of lower third of esophagus ([place])   11/23/2015 Procedure    EGD per [facility](Dr. Toledo):6cm mass in lower third of esophagus      11/24/2015 Initial Diagnosis    Esophageal cancer ([place])      11/24/2015 Pathology Results    Mucinous adenocarcinoma, moderately differentiated-Her2 analysis pending      12/05/2015 Imaging    PET scan showed hypermetabolic a mass in distal esophagus, hypermetabolic activity in the left anterior prostate gland, metabolic density in left adrenal nodule, CT attenuation suggest a benign adrenal adenoma. Left apical 2 mm lung nodule.      12/15/2015 Procedure    EUS showed a uT3N1 lesion from 27cm to 35cm from the incisors       12/26/2015 - 02/02/2016 Chemotherapy    Weekly carboplatin AUC 2 and Taxol 45 mg/m, with concurrent radiation      12/26/2015 - 02/02/2016 Radiation Therapy    Neoadjuvant chemoradiation to the esophageal cancer      04/23/2016  Surgery    total esophagectomy with cervical esophagogastrostomy, pyloroplasty, feeding jejunostomy       04/23/2016 Pathology Results    Esophagectomy showed no residual tumor, 8 lymph nodes were all negative, incidental leiomyoma 0.2 cm      08/14/2016 PET scan    IMPRESSION: 1. Small hypermetabolic left adrenal mass, this has not changed in size compared to 12/01/15 but has a maximum standard uptake value of 6.2 (previously 4.2). Indeterminate density and MRI characteristics, not specific for adenoma. Early metastatic disease is not readily excluded although the lack of change of size is somewhat reassuring. Surveillance recommended. 2. No other hypermetabolic lesions are identified. 3. Moderate to large left pleural effusion with passive atelectasis. 4. Esophagectomy with gastric pull-through. 5. No hypermetabolic liver lesions are seen. 6. Enlarged prostate gland.      08/16/2016 - 08/23/2016 Hospital Admission    He was diagnosed with DIC, unclear etiology. He was given blood products. He underwent echocardiogram which was negative for endocarditis, CTA chest/abd which was negative for aneurysm or dissection as work up for DIC. He was on prednisone for some time, but stopped as it was not helping much for DIC.       08/28/2016 - 09/01/2016 Hospital Admission    He has had issues with recurrent L sided pleural effusions, DIC and melena. He was last admitted in 1/25, given supportive care with a thoracentesis and blood transfusion. Dr Burr Medico has been following DIC and has requested a direct admit for melena,  anemia and thrombocytopenia. GI called for EGD.        08/31/2016 Pathology Results    Bone Marrow Biopsy, right iliac - METASTATIC ADENOCARCINOMA. - SEE COMMENT. PERIPHERAL BLOOD: - NORMOCYTIC ANEMIA. - THROMBOCYTOPENIA.      08/31/2016 Progression    Bone marrow biopsy showed metastatic adenocarcinoma      09/04/2016 -  Chemotherapy    FOLFOX every 2 weeks       09/19/2016  Procedure    Therapeutic thoracentesis of a left pleural effusion performed on 09/19/16 yielded 1.5 liters of blood-tinged fluid. Reactive appearing mesothelial cells were present.      11/12/2016 PET scan     IMPRESSION: 1. Heterogeneous bony uptake with several mildly avid foci primarily in the lower thoracic spine, lumbar spine, and pelvic bones consistent with known bony metastatic disease. 2. There is new uptake in the right adrenal gland not seen previously with no CT correlate. Recommend attention on follow-up. 3. A rounded focus of uptake in the anterior peripheral left prostate is larger in the interval. The findings are nonspecific and could be infectious, inflammatory, or neoplastic. Focal prostatitis or prostate cancer are most likely. 4. The uptake in the left adrenal gland has decreased in the interval. 5. Mild uptake in the bilateral hila may simply represent reactive nodes. Recommend attention on follow-up. 6. The left-sided pleural effusion is a little larger in the interval with loculated components.       HISTORY OF PRESENTING ILLNESS (12/14/2015):  Fred [name] 76 y.o. male is here because of His newly diagnosed esophageal cancer. He is accompanied by his wife and daughter to my clinic today.  He has had dysphagia for 2 months, mainly with solid food, no dysphagia with soft food or liquid. He also has mild pain in mid chest when he swallows. No nausea, abdominal pain, or other discomfort. He has lost about 15 lbs in the last year, no other symoptoms. He was seen by his primary care physician at [facility], and referred to gastroenterologist Dr. Alice Reichert. He underwent EGD on 11/23/2015, which showed a 6 cm mass in the lower third of esophagus. Biopsy showed adenocarcinoma, moderately differentiated. He was referred to [place] to consider neoadjuvant therapy. He is scheduled to see a Psychologist, sport and exercise at [facility] later this week.  He has had some urinary frequency, underwent TURP on  11/02/2015.   CURRENT THERAPY: first line chemo FOLFOX every 2 weeks started on 09/04/2016  INTERIM HISTORY:  Fred [name] returns for follow up and treatment. He has been doing well with no changes since the last visit. He has some pain and some bleeding around his feeding tube. He uses 3 cans a night. His taste buds are effected, but he tries to eat some. Denies troubles with swallowing. He denies numbness, tingling. He does has cold sensitivity after chemo treatments. He is drinking water and Gatorade  He is now insured through the [place]. If he has to go into the hospital, Misty would like to know.   MEDICAL HISTORY:  Past Medical History:  Diagnosis Date  . Esophageal cancer ([place]) 11/23/15   lower 3rd esohagus   . GERD (gastroesophageal reflux disease)   . Hyperlipidemia   . Hypertension     SURGICAL HISTORY: Past Surgical History:  Procedure Laterality Date  . CHEST TUBE INSERTION Left 04/23/2016   Procedure: CHEST TUBE INSERTION;  Surgeon: [name], MD;  Location: [place];  Service: Thoracic;  Laterality: Left;  . COLONOSCOPY N/A 08/30/2016  Procedure: COLONOSCOPY;  Surgeon: Milus Banister, MD;  Location: [facility];  Service: Endoscopy;  Laterality: N/A;  . COMPLETE ESOPHAGECTOMY N/A 04/23/2016   Procedure: TRANSHIATIAL TOTAL ESOPHAGECTOMY COMPLETE; CERVICAL ESOPHAGOGASTROSTOMY AND PYLOROPLASTY;  Surgeon: [name], MD;  Location: [place];  Service: Thoracic;  Laterality: N/A;  . cystoscope  4/12/1   prostate  . ERCP    . ESOPHAGOGASTRODUODENOSCOPY (EGD) WITH PROPOFOL N/A 08/29/2016   Procedure: ESOPHAGOGASTRODUODENOSCOPY (EGD) WITH PROPOFOL;  Surgeon: Milus Banister, MD;  Location: [facility];  Service: Endoscopy;  Laterality: N/A;  . INGUINAL HERNIA REPAIR    . IR GENERIC HISTORICAL  08/31/2016   IR FLUORO GUIDE PORT INSERTION RIGHT 08/31/2016 Corrie Mckusick, DO [facility]INTER[facility]  . IR GENERIC HISTORICAL  08/31/2016   IR US GUIDE VASC ACCESS RIGHT 08/31/2016 Corrie Mckusick, DO  [facility]  . JEJUNOSTOMY N/A 04/23/2016   Procedure: FEEDING JEJUNOSTOMY;  Surgeon: [name], MD;  Location: [place];  Service: Thoracic;  Laterality: N/A;  . LEG SURGERY Left    hole  . PROSTATE BIOPSY  10/05/15  . right shoulder surgery    . VIDEO BRONCHOSCOPY N/A 04/23/2016   Procedure: VIDEO BRONCHOSCOPY;  Surgeon: [name], MD;  Location[facility];  Service: Thoracic;  Laterality: N/A;    SOCIAL HISTORY: Social History   Social History  . Marital status: Married    Spouse name: N/A  . Number of children: 1  . Years of education: N/A   Occupational History  . Truck Geophysicist/field seismologist    Social History Main Topics  . Smoking status: Former Smoker    Packs/day: 1.00    Years: 10.00    Quit date: 07/24/1967  . Smokeless tobacco: Never Used  . Alcohol use No  . Drug use: No  . Sexual activity: Not Currently   Other Topics Concern  . Not on file   Social History Narrative  . No narrative on file    FAMILY HISTORY: Family History  Problem Relation Age of Onset  . Cancer Maternal Grandfather 62       gastric cancer   . Alzheimer's disease Mother   . Diabetes Mother   . Stroke Father 38  . Multiple sclerosis Sister     ALLERGIES:  is allergic to no known allergies.  MEDICATIONS:  Current Outpatient Prescriptions  Medication Sig Dispense Refill  . atenolol (TENORMIN) 25 MG tablet Take 1 tab in the morning and half tab in the evening (Patient taking differently: Take 12.5-25 mg by mouth 2 (two) times daily. Take '25mg'$  in the morning and 12.'5mg'$  in the evening) 60 tablet 1  . lidocaine-prilocaine (EMLA) cream Apply 1 application topically as needed. Apply 1-2 tsp over port site 1 hour prior to chemotherapy 30 g 1  . mirtazapine (REMERON) 15 MG tablet Take 1 tablet (15 mg total) by mouth at bedtime. 30 tablet 2  . Nutritional Supplements (NUTREN 1.5) LIQD Take 250 mLs by mouth at bedtime. 3 CANS VIA FEEDING TUBE    . prochlorperazine (COMPAZINE) 10 MG tablet Take 1  tablet (10 mg total) by mouth every 6 (six) hours as needed for nausea or vomiting. 30 tablet 3  . acetaminophen (TYLENOL) 325 MG tablet Take 650 mg by mouth every 6 (six) hours as needed.    . folic acid (FOLVITE) 1 MG tablet Take 1 tablet (1 mg total) by mouth daily. (Patient not taking: Reported on 01/24/2017) 30 tablet 0  . ondansetron (ZOFRAN ODT) 8 MG disintegrating tablet  Take 1 tablet (8 mg total) by mouth every 8 (eight) hours as needed for nausea or vomiting. (Patient not taking: Reported on 01/24/2017) 30 tablet 2  . oxycodone (OXY-IR) 5 MG capsule Take 5 mg by mouth every 6 (six) hours as needed for pain.     . vitamin B-12 1000 MCG tablet Take 1 tablet (1,000 mcg total) by mouth daily. (Patient not taking: Reported on 01/24/2017) 30 tablet 0  . Water For Irrigation, Sterile (FREE WATER) SOLN Place 220 mLs into feeding tube 4 (four) times daily.     No current facility-administered medications for this visit.     REVIEW OF SYSTEMS:   Constitutional: Denies fevers, chills or abnormal night sweats  Eyes: Denies blurriness of vision, double vision or watery eyes Ears, nose, mouth, throat, and face: Denies mucositis or sore throat Respiratory: Denies cough, dyspnea or wheezes Cardiovascular: Denies palpitation, chest discomfort or lower extremity swelling Gastrointestinal:  Denies heartburn or change in bowel habits (+) Drainage around J-tube. (+) some bleeding from feeding tube  Skin: Denies abnormal skin rashes Lymphatics: Denies new lymphadenopathy or easy bruising Neurological:Denies numbness, tingling or new weaknesses Behavioral/Psych: Mood is stable, no new changes  Musculoskeletal: All other systems were reviewed with the patient and are negative.  PHYSICAL EXAMINATION:  ECOG PERFORMANCE STATUS: 2  Vitals:   01/24/17 1013  BP: (!) 108/52  Pulse: 77  Resp: 18  Temp: 98.4 F (36.9 C)  TempSrc: Oral  SpO2: 100%  Weight: 136 lb 14.4 oz (62.1 kg)  Height: '5\' 5"'$  (1.651 m)     GENERAL:alert, no distress and comfortable, in wheelchair SKIN: Appears to be pale, skin texture, turgor are normal, no rashes or significant lesions EYES: normal, conjunctiva are pink and non-injected, sclera clear OROPHARYNX:no exudate, no erythema and lips, buccal mucosa, and tongue normal  NECK: supple, thyroid normal size, non-tender, without nodularity LYMPH:  no palpable lymphadenopathy in the cervical, axillary or inguinal LUNGS: clear to auscultation and percussion with normal breathing effort HEART: regular rate & rhythm and no murmurs and no lower extremity edema ABDOMEN:abdomen soft, non-tender and normal bowel sounds, J tube in place. No skin erythema or pus.(+) Feeding tube site with small bleeding around feeding tube site, minimal amount of yellowish secretion. No skin erythema around J tube. Normal mucosa  Musculoskeletal:no cyanosis of digits and no clubbing  PSYCH: alert & oriented x 3 with fluent speech NEURO: no focal motor/sensory deficits  LABORATORY DATA:  I have reviewed the data as listed CBC Latest Ref Rng & Units 01/24/2017 01/09/2017 12/26/2016  WBC 4.0 - 10.3 10e3/uL 4.7 5.1 5.0  Hemoglobin 13.0 - 17.1 g/dL 11.9(L) 12.2(L) 11.5(L)  Hematocrit 38.4 - 49.9 % 35.0(L) 35.4(L) 34.0(L)  Platelets 140 - 400 10e3/uL 163 156 148   CMP Latest Ref Rng & Units 01/24/2017 01/09/2017 12/26/2016  Glucose 70 - 140 mg/dl 110 112 139  BUN 7.0 - 26.0 mg/dL 10.7 10.2 13.1  Creatinine 0.7 - 1.3 mg/dL 0.6(L) 0.6(L) 0.6(L)  Sodium 136 - 145 mEq/L 139 141 138  Potassium 3.5 - 5.1 mEq/L 4.2 4.3 4.3  Chloride 101 - 111 mmol/L - - -  CO2 22 - 29 mEq/L '24 23 23  '$ Calcium 8.4 - 10.4 mg/dL 9.3 9.3 9.0  Total Protein 6.4 - 8.3 g/dL 6.3(L) 6.4 6.0(L)  Total Bilirubin 0.20 - 1.20 mg/dL 0.37 0.40 0.31  Alkaline Phos 40 - 150 U/L 164(H) 161(H) 159(H)  AST 5 - 34 U/L '26 25 22  '$ ALT 0 -  55 U/L '13 15 13   '$ Results for Fred [name], Fred [name] (MRN [id number]) as of 01/22/2017 13:59  Ref. Range 12/12/2016 12:01  12/12/2016 12:02 01/09/2017 09:10  CEA ([facility]In House) Latest Ref Range: 0.00 - 5.00 ng/mL 4.33  2.88  Prostate Specific Ag, Serum Latest Ref Range: 0.0 - 4.0 ng/mL  3.0      Pathology report [facility] TESTING 09/29/2016   PD-L1 Testing 09/29/2016   Diagnosis 09/27/2016 Consult- Comprehensive, [id number] - 1A Mass, Lower third of Esophagus, Mucosal Biopsy - ADENOCARCINOMA WITH EXTRACELLULAR MUCIN. - SEE COMMENT. Microscopic Comment Provided Her2 IHC with the appropriate controls is negative. Per report Her2 FISH is also negative. There is limited tissue remaining, but [facility] One and PDL-1 testing will be attempted as requested.  Diagnosis 09/19/2016 PLEURAL FLUID, LEFT (SPECIMEN 1 OF 1 COLLECTED 09/19/16): REACTIVE APPEARING MESOTHELIAL CELLS PRESENT.  Diagnosis 08/31/2016 Bone Marrow Biopsy, right iliac - METASTATIC ADENOCARCINOMA. - SEE COMMENT. PERIPHERAL BLOOD: - NORMOCYTIC ANEMIA. - THROMBOCYTOPENIA.  Diagnosis 08/02/16 PLEURAL FLUID, LEFT (SPECIMEN 1 OF 1 COLLECTED 08/02/16): REACTIVE MESOTHELIAL CELLS PRESENT. LYMPHOCYTES PRESENT.  Diagnosis 11/23/2015 Mass, lower third of the esophagus, mucosal biopsy Mucinous adenocarcinoma, moderately differentiated.  Diagnosis 04/23/2016 1. Lymph node, biopsy, Cervical - ONE OF ONE LYMPH NODES NEGATIVE FOR CARCINOMA (0/1). 2. Lymph node, biopsy, Periesophageal - BLOOD WITH SCANT BENIGN SOFT TISSUE AND SKELETAL MUSCLE. - NO MALIGNANCY IDENTIFIED. 3. Esophagogastrectomy - ULCERATION WITH INFLAMMATION AND REACTIVE CHANGES. - FOCAL INTESTINAL METAPLASIA. - CHANGES CONSISTENT WITH TREATMENT EFFECT. - SEVEN OF SEVEN LYMPH NODES NEGATIVE FOR CARCINOMA (0/7). - INCIDENTAL LEIOMYOMA, 0.2 CM. - MARGINS ARE NEGATIVE FOR DYSPLASIA OR MALIGNANCY. - SEE ONCOLOGY TABLE. Microscopic Comment 3. ESOPHAGUS: Specimen: Esophagus and proximal stomach, cervical lymph node. Procedure: Esophagogastrectomy and cervical lymph node biopsy. Tumor  Site: Presumed GE junction, see comment. Relationship of Tumor to esophagogastric junction: Can not be assessed. Distance of tumor center from esophagogastric junction: Can not be assessed. Tumor Size Greatest dimension: No residual tumor present. Histologic Type: Per medical record adenocarcinoma (no biopsy for review). Histologic Grade: N/A. Microscopic Tumor Extension: N/A. Margins: Negative. Treatment Effect: Present (TRS 0). Lymph-Vascular Invasion: Not identified. Perineural Invasion: Not identified. Lymph nodes: number examined 8; number positive: 0 TNM: ypT0, ypN0 see comment. 1 of 3 FINAL for Fred [name], Fred [name] ([id number]) Microscopic Comment(continued) Ancillary studies: None. Comments: The entire GE junction is submitted for evaluation. There is ulceration with associated inflammation. There is acellular mucin/myxoid changes which extend through the muscularis propria, but no viable/residual tumor is identified and thus the stage is ypT0. There is focal background intestinal metaplasia, which may have been pre-existing (Barrett's esophagus) or related to therapy.   RADIOGRAPHIC STUDIES: I have personally reviewed the radiological images as listed and agreed with the findings in the report. Dg Chest 2 View  Result Date: 01/17/2017 CLINICAL DATA:  Esophageal cancer treated with radiation and chemotherapy. Evaluate pleural effusion. EXAM: CHEST  2 VIEW COMPARISON:  12/13/2016 and previous FINDINGS: Power port inserted on the right has its tip in the SVC above the right atrium. Right chest again shows minimal blunting of the posterior costophrenic angle but is otherwise clear. On the left, there is a moderate effusion layering dependently, similar to the previous exam, probably slightly reduced. Dependent pulmonary atelectasis associated with that. No acute bone finding. Partial compression fracture of L1 as seen previously. IMPRESSION: Persistent left effusion, probably slightly  smaller than on the previous exam. Tiny amount of fluid in the posterior costophrenic angle on the right. Electronically  Signed   By: Nelson Chimes M.D.   On: 01/17/2017 16:12   PET 08/14/2016 IMPRESSION: 1. Small hypermetabolic left adrenal mass, this has not changed in size compared to 12/01/15 but has a maximum standard uptake value of 6.2 (previously 4.2). Indeterminate density and MRI characteristics, not specific for adenoma. Early metastatic disease is not readily excluded although the lack of change of size is somewhat reassuring. Surveillance recommended. 2. No other hypermetabolic lesions are identified. 3. Moderate to large left pleural effusion with passive atelectasis. 4. Esophagectomy with gastric pull-through. 5. No hypermetabolic liver lesions are seen. 6. Enlarged prostate gland.  CT angio chest/abdomen/pelvis for dissection w/wo contrast 08/22/2016 IMPRESSION: No evidence of aortic aneurysm or dissection. No evidence of pulmonary embolus. Heart is borderline in size. Stable small left adrenal nodule which was hypermetabolic on prior PET CT. Stable large left pleural effusion with compressive atelectasis in the left lower lobe. Stable appearance of the gastric pull-through post esophagectomy.  US Thoracentesis asp pleural space 09/19/2016 IMPRESSION: Successful ultrasound guided diagnostic and therapeutic left thoracentesis yielding 1.5 liters of pleural fluid.  PET 11/12/2016 IMPRESSION: 1. Heterogeneous bony uptake with several mildly avid foci primarily in the lower thoracic spine, lumbar spine, and pelvic bones consistent with known bony metastatic disease. 2. There is new uptake in the right adrenal gland not seen previously with no CT correlate. Recommend attention on follow-up. 3. A rounded focus of uptake in the anterior peripheral left prostate is larger in the interval. The findings are nonspecific and could be infectious, inflammatory, or neoplastic.  Focal prostatitis or prostate cancer are most likely. 4. The uptake in the left adrenal gland has decreased in the interval. 5. Mild uptake in the bilateral hila may simply represent reactive nodes. Recommend attention on follow-up. 6. The left-sided pleural effusion is a little larger in the interval with loculated components.   ASSESSMENT & PLAN:  76 y.o. male presented with dysphagia with solid food  1. Distal esophageal adenocarcinoma, uT3N1M0, stage IIIA, ypT0N0M0, diffuse bone metastasis 08/2016, HER2 unknown -I previously reviewed his CT scan, PET scan, EGD, and the biopsy findings with patient and his family member in details. -The initial PET scan showed no definitive distant metastasis. 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 This will be followed in the future scan. -By EUS, he had T3 N1 stage III disease. -He completed neoadjuvant radiation with weekly Carbo and Taxol. -I previously reviewed his surgical pathology findings, he has had complete pathologic response to neoadjuvant chemotherapy and radiation, which predicts good prognosis -Unfortunately he developed diffuse bone metastasis in January 2018, confirmed by bone marrow biopsy -He has started first-line chemotherapy FOLFOX, tolerating well, and his anemia and some cytopenia has previously improved since chemotherapy -The goal of therapy is palliative -I have requested HER2 IHC test but was not sufficient tissue -his tumor was negative for PD-L1 expression, he is not a candidate for immunotherapy -I previously reviewed his Foundation One genomic testing results, unfortunately there is no good targeted therapy available. His tumor has MSI stable -He has clinically been doing well overall, has required blood transfusion (2 PRBCs) on 09/28/16 since he started chemotherapy, previously no significant noticeable side effects from chemotherapy, he has gained some  weight. We will continue chemotherapy every 2 weeks. -We previously discussed his clinical response may be difficult to evaluate since his PET scan was negative when he had a bone metastasis, we'll based on the blood counts, and CEA  level, also repeat PET scan every 3 months to evaluate his response. -CEA has decreased significant since starting FOLFOX in February 2018. - restaging PET Scan taken 11/12/16 were reviewed with pt and his family.  No concern for other new metastasis, his diffuse bone mets is difficult to evaluate on scan, but clinically he is doing well with CEA significantly dropping, corresponding to good response  -continue chemo every 2 weeks  -Labs adequate for treatment today, he is clinically doing well, tolerating chemo very well, will continue  - he should watch out for neuropathy from chemo - will continue as long as he is tolerating well, we will also consider maintenance therapy if his disease is well controlled (may require a repeated bone marrow biopsy)  - PET Scan in 3 weeks   2. 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 thrombocytopenia has resolved. -The patient had many blood transfusions with the last on 09/28/16, he has not required any blood transfusion since he started chemotherapy. -Continue monitoring CBC  3. 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  -He has had 3 repeated thoracentesis, all cytology were negative, -Therapeutic thoracentesis performed on 09/19/16 yielded 1.5 liters of blood-tinged fluid. Reactive appearing mesothelial cells were present. -Continue monitoring, previously repeated CXR -He was previously scheduled to follow-up with Dr. Pia Mau   4. HTN -Continue medication and follow-up of his primary care physician -his PCP at [facility] has changed his BP meds lately   5. Malnutrition -Doing better, he is tolerating J-tube  feeding very well, he eats normally. Encouraged the patient to eat small frequent meals. -he has decreased his tube feeds lately  -Follow up with dietitian -Feeding tube site is clean and no bleeding lately   6. Anorexia and depression  -continue mirtazapine. Overall, previously improved lately  -He previously hasn't talked to the nutritionist in a while, he will follow up   7. Fatigue -He would like something to help his energy. -We previously discussed steroids to help for this, but I told them that this is not a long term solution -He would not like steroids at this time.  -We previously discussed fatigue being related to chemo or anemia.  -I previously encouraged him to eat well with protein and be more active   8. Goal of care discussion  -We previously discussed the incurable nature of his cancer, and the overall poor prognosis, especially if he does not have good response to chemotherapy or progress on chemo -The patient understands the goal of care is palliative. -I recommend DNR/DNI, he will think about it   9. BPH  -He has an enlarged prostate, the recent PET scan showed some focal uptake in prostate -PSA normal on 12/12/2016, no suspicion for prostate cancer   Plan -Lab reviewed, adequate for treatment today. Continue FOLFOX every 2 weeks on wednesdays  - PET Scan in 3 weeks -labs, flush and f/u in 2 and 4 weeks  -f/u in 4 weeks    All questions were answered. The patient knows to call the clinic with any problems, questions or concerns.  I spent 25 minutes counseling the patient face to face. The total time spent in the appointment was 30 minutes and more than 50% was on counseling.   Truitt Merle, MD 01/24/2017   This document serves as a record of services personally performed by Truitt Merle, MD. It was created on her behalf by Brandt Loosen, a trained medical scribe.  The creation of this record is based on the scribe's personal observations and the provider's statements  to them. This document has been checked and approved by the attending provider.

## 2017-01-24 ENCOUNTER — Ambulatory Visit: Payer: No Typology Code available for payment source

## 2017-01-24 ENCOUNTER — Telehealth: Payer: Self-pay | Admitting: Hematology

## 2017-01-24 ENCOUNTER — Ambulatory Visit (HOSPITAL_BASED_OUTPATIENT_CLINIC_OR_DEPARTMENT_OTHER): Payer: No Typology Code available for payment source | Admitting: Hematology

## 2017-01-24 ENCOUNTER — Ambulatory Visit (HOSPITAL_BASED_OUTPATIENT_CLINIC_OR_DEPARTMENT_OTHER): Payer: No Typology Code available for payment source

## 2017-01-24 ENCOUNTER — Other Ambulatory Visit (HOSPITAL_BASED_OUTPATIENT_CLINIC_OR_DEPARTMENT_OTHER): Payer: No Typology Code available for payment source

## 2017-01-24 VITALS — BP 108/52 | HR 77 | Temp 98.4°F | Resp 18 | Ht 65.0 in | Wt 136.9 lb

## 2017-01-24 DIAGNOSIS — C7951 Secondary malignant neoplasm of bone: Secondary | ICD-10-CM

## 2017-01-24 DIAGNOSIS — C155 Malignant neoplasm of lower third of esophagus: Secondary | ICD-10-CM

## 2017-01-24 DIAGNOSIS — E43 Unspecified severe protein-calorie malnutrition: Secondary | ICD-10-CM | POA: Diagnosis not present

## 2017-01-24 DIAGNOSIS — Z95828 Presence of other vascular implants and grafts: Secondary | ICD-10-CM

## 2017-01-24 DIAGNOSIS — Z5111 Encounter for antineoplastic chemotherapy: Secondary | ICD-10-CM

## 2017-01-24 DIAGNOSIS — I10 Essential (primary) hypertension: Secondary | ICD-10-CM

## 2017-01-24 LAB — CBC & DIFF AND RETIC
BASO%: 0.4 % (ref 0.0–2.0)
BASOS ABS: 0 10*3/uL (ref 0.0–0.1)
EOS%: 1.5 % (ref 0.0–7.0)
Eosinophils Absolute: 0.1 10*3/uL (ref 0.0–0.5)
HEMATOCRIT: 35 % — AB (ref 38.4–49.9)
HEMOGLOBIN: 11.9 g/dL — AB (ref 13.0–17.1)
IMMATURE RETIC FRACT: 9.4 % (ref 3.00–10.60)
LYMPH#: 1 10*3/uL (ref 0.9–3.3)
LYMPH%: 20.3 % (ref 14.0–49.0)
MCH: 32.8 pg (ref 27.2–33.4)
MCHC: 34 g/dL (ref 32.0–36.0)
MCV: 96.4 fL (ref 79.3–98.0)
MONO#: 0.9 10*3/uL (ref 0.1–0.9)
MONO%: 19.4 % — ABNORMAL HIGH (ref 0.0–14.0)
NEUT#: 2.7 10*3/uL (ref 1.5–6.5)
NEUT%: 58.4 % (ref 39.0–75.0)
PLATELETS: 163 10*3/uL (ref 140–400)
RBC: 3.63 10*6/uL — ABNORMAL LOW (ref 4.20–5.82)
RDW: 14.6 % (ref 11.0–14.6)
RETIC CT ABS: 87.85 10*3/uL (ref 34.80–93.90)
Retic %: 2.42 % — ABNORMAL HIGH (ref 0.80–1.80)
WBC: 4.7 10*3/uL (ref 4.0–10.3)

## 2017-01-24 LAB — COMPREHENSIVE METABOLIC PANEL
ALT: 13 U/L (ref 0–55)
AST: 26 U/L (ref 5–34)
Albumin: 3.2 g/dL — ABNORMAL LOW (ref 3.5–5.0)
Alkaline Phosphatase: 164 U/L — ABNORMAL HIGH (ref 40–150)
Anion Gap: 10 mEq/L (ref 3–11)
BILIRUBIN TOTAL: 0.37 mg/dL (ref 0.20–1.20)
BUN: 10.7 mg/dL (ref 7.0–26.0)
CO2: 24 meq/L (ref 22–29)
Calcium: 9.3 mg/dL (ref 8.4–10.4)
Chloride: 106 mEq/L (ref 98–109)
Creatinine: 0.6 mg/dL — ABNORMAL LOW (ref 0.7–1.3)
GLUCOSE: 110 mg/dL (ref 70–140)
Potassium: 4.2 mEq/L (ref 3.5–5.1)
SODIUM: 139 meq/L (ref 136–145)
TOTAL PROTEIN: 6.3 g/dL — AB (ref 6.4–8.3)

## 2017-01-24 MED ORDER — DEXAMETHASONE SODIUM PHOSPHATE 10 MG/ML IJ SOLN
10.0000 mg | Freq: Once | INTRAMUSCULAR | Status: AC
  Administered 2017-01-24: 10 mg via INTRAVENOUS

## 2017-01-24 MED ORDER — SODIUM CHLORIDE 0.9% FLUSH
10.0000 mL | INTRAVENOUS | Status: DC | PRN
  Administered 2017-01-24: 10 mL via INTRAVENOUS
  Filled 2017-01-24: qty 10

## 2017-01-24 MED ORDER — LEUCOVORIN CALCIUM INJECTION 350 MG
400.0000 mg/m2 | Freq: Once | INTRAVENOUS | Status: AC
  Administered 2017-01-24: 656 mg via INTRAVENOUS
  Filled 2017-01-24: qty 32.8

## 2017-01-24 MED ORDER — OXALIPLATIN CHEMO INJECTION 100 MG/20ML
60.0000 mg/m2 | Freq: Once | INTRAVENOUS | Status: AC
  Administered 2017-01-24: 100 mg via INTRAVENOUS
  Filled 2017-01-24: qty 20

## 2017-01-24 MED ORDER — PALONOSETRON HCL INJECTION 0.25 MG/5ML
0.2500 mg | Freq: Once | INTRAVENOUS | Status: AC
  Administered 2017-01-24: 0.25 mg via INTRAVENOUS

## 2017-01-24 MED ORDER — DEXTROSE 5 % IV SOLN
Freq: Once | INTRAVENOUS | Status: AC
  Administered 2017-01-24: 11:00:00 via INTRAVENOUS

## 2017-01-24 MED ORDER — FLUOROURACIL CHEMO INJECTION 5 GM/100ML
2000.0000 mg/m2 | INTRAVENOUS | Status: DC
  Administered 2017-01-24: 3300 mg via INTRAVENOUS
  Filled 2017-01-24: qty 66

## 2017-01-24 MED ORDER — PALONOSETRON HCL INJECTION 0.25 MG/5ML
INTRAVENOUS | Status: AC
  Filled 2017-01-24: qty 5

## 2017-01-24 MED ORDER — DEXAMETHASONE SODIUM PHOSPHATE 10 MG/ML IJ SOLN
INTRAMUSCULAR | Status: AC
  Filled 2017-01-24: qty 1

## 2017-01-24 NOTE — Patient Instructions (Signed)
[facility] Discharge Instructions for Patients Receiving Chemotherapy  Today you received the following chemotherapy agents: Oxaliplatin, Leucovorin and Adrucil.   To help prevent nausea and vomiting after your treatment, we encourage you to take your nausea medication as directed.  If you develop nausea and vomiting that is not controlled by your nausea medication, call the clinic.   BELOW ARE SYMPTOMS THAT SHOULD BE REPORTED IMMEDIATELY:  *FEVER GREATER THAN 100.5 F  *CHILLS WITH OR WITHOUT FEVER  NAUSEA AND VOMITING THAT IS NOT CONTROLLED WITH YOUR NAUSEA MEDICATION  *UNUSUAL SHORTNESS OF BREATH  *UNUSUAL BRUISING OR BLEEDING  TENDERNESS IN MOUTH AND THROAT WITH OR WITHOUT PRESENCE OF ULCERS  *URINARY PROBLEMS  *BOWEL PROBLEMS  UNUSUAL RASH Items with * indicate a potential emergency and should be followed up as soon as possible.  Feel free to call the clinic you have any questions or concerns. The clinic phone number is [phone].  Please show the CHEMO ALERT CARD at check-in to the Emergency Department and triage nurse.   

## 2017-01-24 NOTE — Telephone Encounter (Signed)
Scheduled appt per 7/5 los - patient to get new schedule in the treatment area.

## 2017-01-24 NOTE — Patient Instructions (Signed)
Implanted Port Home Guide An implanted port is a type of central line that is placed under the skin. Central lines are used to provide IV access when treatment or nutrition needs to be given through a person's veins. Implanted ports are used for long-term IV access. An implanted port may be placed because:  You need IV medicine that would be irritating to the small veins in your hands or arms.  You need long-term IV medicines, such as antibiotics.  You need IV nutrition for a long period.  You need frequent blood draws for lab tests.  You need dialysis.  Implanted ports are usually placed in the chest area, but they can also be placed in the upper arm, the abdomen, or the leg. An implanted port has two main parts:  Reservoir. The reservoir is round and will appear as a small, raised area under your skin. The reservoir is the part where a needle is inserted to give medicines or draw blood.  Catheter. The catheter is a thin, flexible tube that extends from the reservoir. The catheter is placed into a large vein. Medicine that is inserted into the reservoir goes into the catheter and then into the vein.  How will I care for my incision site? Do not get the incision site wet. Bathe or shower as directed by your health care provider. How is my port accessed? Special steps must be taken to access the port:  Before the port is accessed, a numbing cream can be placed on the skin. This helps numb the skin over the port site.  Your health care provider uses a sterile technique to access the port. ? Your health care provider must put on a mask and sterile gloves. ? The skin over your port is cleaned carefully with an antiseptic and allowed to dry. ? The port is gently pinched between sterile gloves, and a needle is inserted into the port.  Only "non-coring" port needles should be used to access the port. Once the port is accessed, a blood return should be checked. This helps ensure that the port  is in the vein and is not clogged.  If your port needs to remain accessed for a constant infusion, a clear (transparent) bandage will be placed over the needle site. The bandage and needle will need to be changed every week, or as directed by your health care provider.  Keep the bandage covering the needle clean and dry. Do not get it wet. Follow your health care provider's instructions on how to take a shower or bath while the port is accessed.  If your port does not need to stay accessed, no bandage is needed over the port.  What is flushing? Flushing helps keep the port from getting clogged. Follow your health care provider's instructions on how and when to flush the port. Ports are usually flushed with saline solution or a medicine called heparin. The need for flushing will depend on how the port is used.  If the port is used for intermittent medicines or blood draws, the port will need to be flushed: ? After medicines have been given. ? After blood has been drawn. ? As part of routine maintenance.  If a constant infusion is running, the port may not need to be flushed.  How long will my port stay implanted? The port can stay in for as long as your health care provider thinks it is needed. When it is time for the port to come out, surgery will be   done to remove it. The procedure is similar to the one performed when the port was put in. When should I seek immediate medical care? When you have an implanted port, you should seek immediate medical care if:  You notice a bad smell coming from the incision site.  You have swelling, redness, or drainage at the incision site.  You have more swelling or pain at the port site or the surrounding area.  You have a fever that is not controlled with medicine.  This information is not intended to replace advice given to you by your health care provider. Make sure you discuss any questions you have with your health care provider. Document  Released: 07/09/2005 Document Revised: 12/15/2015 Document Reviewed: 03/16/2013 [employer] Interactive Patient Education  2017 [employer]  

## 2017-01-26 ENCOUNTER — Encounter: Payer: Self-pay | Admitting: Hematology

## 2017-01-26 ENCOUNTER — Ambulatory Visit (HOSPITAL_BASED_OUTPATIENT_CLINIC_OR_DEPARTMENT_OTHER): Payer: Self-pay

## 2017-01-26 VITALS — BP 113/58 | HR 74 | Temp 98.7°F

## 2017-01-26 DIAGNOSIS — C155 Malignant neoplasm of lower third of esophagus: Secondary | ICD-10-CM

## 2017-01-26 MED ORDER — HEPARIN SOD (PORK) LOCK FLUSH 100 UNIT/ML IV SOLN
500.0000 [IU] | Freq: Once | INTRAVENOUS | Status: AC | PRN
  Administered 2017-01-26: 500 [IU]
  Filled 2017-01-26: qty 5

## 2017-01-26 MED ORDER — SODIUM CHLORIDE 0.9% FLUSH
10.0000 mL | INTRAVENOUS | Status: DC | PRN
Start: 2017-01-26 — End: 2017-01-26
  Administered 2017-01-26: 10 mL
  Filled 2017-01-26: qty 10

## 2017-01-26 NOTE — Patient Instructions (Signed)

## 2017-02-05 ENCOUNTER — Telehealth: Payer: Self-pay | Admitting: *Deleted

## 2017-02-05 NOTE — Telephone Encounter (Signed)
Received call from wife Fred [name] wanting to talk to nurse.  Spoke with Fred [name], and was informed that for past few days, pt has experienced " pins and needles " in fingers, toes, and heels.  Stated pt can still walk ok, can feed himself fine, just the tingling sensation. Dr. Burr Medico notified.   MD to see pt in infusion room on Wed  02/06/17 prior to chemo.  Scheduled message sent. Fred [name]'s    Phone      [phone].

## 2017-02-06 ENCOUNTER — Ambulatory Visit (HOSPITAL_BASED_OUTPATIENT_CLINIC_OR_DEPARTMENT_OTHER): Payer: No Typology Code available for payment source | Admitting: Hematology

## 2017-02-06 ENCOUNTER — Other Ambulatory Visit (HOSPITAL_BASED_OUTPATIENT_CLINIC_OR_DEPARTMENT_OTHER): Payer: No Typology Code available for payment source

## 2017-02-06 ENCOUNTER — Ambulatory Visit (HOSPITAL_BASED_OUTPATIENT_CLINIC_OR_DEPARTMENT_OTHER): Payer: No Typology Code available for payment source

## 2017-02-06 VITALS — BP 123/56 | HR 84 | Temp 98.5°F | Resp 18 | Wt 137.2 lb

## 2017-02-06 DIAGNOSIS — R5383 Other fatigue: Secondary | ICD-10-CM

## 2017-02-06 DIAGNOSIS — J90 Pleural effusion, not elsewhere classified: Secondary | ICD-10-CM

## 2017-02-06 DIAGNOSIS — N400 Enlarged prostate without lower urinary tract symptoms: Secondary | ICD-10-CM

## 2017-02-06 DIAGNOSIS — Z5111 Encounter for antineoplastic chemotherapy: Secondary | ICD-10-CM | POA: Diagnosis not present

## 2017-02-06 DIAGNOSIS — D6481 Anemia due to antineoplastic chemotherapy: Secondary | ICD-10-CM

## 2017-02-06 DIAGNOSIS — I10 Essential (primary) hypertension: Secondary | ICD-10-CM | POA: Diagnosis not present

## 2017-02-06 DIAGNOSIS — F329 Major depressive disorder, single episode, unspecified: Secondary | ICD-10-CM

## 2017-02-06 DIAGNOSIS — G620 Drug-induced polyneuropathy: Secondary | ICD-10-CM

## 2017-02-06 DIAGNOSIS — D591 Other autoimmune hemolytic anemias: Secondary | ICD-10-CM | POA: Diagnosis not present

## 2017-02-06 DIAGNOSIS — E46 Unspecified protein-calorie malnutrition: Secondary | ICD-10-CM | POA: Diagnosis not present

## 2017-02-06 DIAGNOSIS — R630 Anorexia: Secondary | ICD-10-CM

## 2017-02-06 DIAGNOSIS — C7951 Secondary malignant neoplasm of bone: Secondary | ICD-10-CM | POA: Diagnosis not present

## 2017-02-06 DIAGNOSIS — C155 Malignant neoplasm of lower third of esophagus: Secondary | ICD-10-CM

## 2017-02-06 DIAGNOSIS — E279 Disorder of adrenal gland, unspecified: Secondary | ICD-10-CM

## 2017-02-06 DIAGNOSIS — Z7189 Other specified counseling: Secondary | ICD-10-CM

## 2017-02-06 LAB — COMPREHENSIVE METABOLIC PANEL
ALBUMIN: 3.2 g/dL — AB (ref 3.5–5.0)
ALK PHOS: 158 U/L — AB (ref 40–150)
ALT: 14 U/L (ref 0–55)
AST: 25 U/L (ref 5–34)
Anion Gap: 11 mEq/L (ref 3–11)
BILIRUBIN TOTAL: 0.45 mg/dL (ref 0.20–1.20)
BUN: 9 mg/dL (ref 7.0–26.0)
CALCIUM: 9.3 mg/dL (ref 8.4–10.4)
CO2: 23 mEq/L (ref 22–29)
Chloride: 103 mEq/L (ref 98–109)
Creatinine: 0.6 mg/dL — ABNORMAL LOW (ref 0.7–1.3)
GLUCOSE: 100 mg/dL (ref 70–140)
POTASSIUM: 4.2 meq/L (ref 3.5–5.1)
SODIUM: 136 meq/L (ref 136–145)
TOTAL PROTEIN: 6.3 g/dL — AB (ref 6.4–8.3)

## 2017-02-06 LAB — CBC & DIFF AND RETIC
BASO%: 0.4 % (ref 0.0–2.0)
Basophils Absolute: 0 10*3/uL (ref 0.0–0.1)
EOS%: 1.6 % (ref 0.0–7.0)
Eosinophils Absolute: 0.1 10*3/uL (ref 0.0–0.5)
HCT: 34.9 % — ABNORMAL LOW (ref 38.4–49.9)
HGB: 12 g/dL — ABNORMAL LOW (ref 13.0–17.1)
IMMATURE RETIC FRACT: 8.4 % (ref 3.00–10.60)
LYMPH#: 1 10*3/uL (ref 0.9–3.3)
LYMPH%: 17.5 % (ref 14.0–49.0)
MCH: 32.9 pg (ref 27.2–33.4)
MCHC: 34.4 g/dL (ref 32.0–36.0)
MCV: 95.6 fL (ref 79.3–98.0)
MONO#: 1 10*3/uL — AB (ref 0.1–0.9)
MONO%: 17.3 % — ABNORMAL HIGH (ref 0.0–14.0)
NEUT#: 3.5 10*3/uL (ref 1.5–6.5)
NEUT%: 63.2 % (ref 39.0–75.0)
PLATELETS: 153 10*3/uL (ref 140–400)
RBC: 3.65 10*6/uL — AB (ref 4.20–5.82)
RDW: 14.7 % — AB (ref 11.0–14.6)
RETIC %: 2.67 % — AB (ref 0.80–1.80)
RETIC CT ABS: 97.46 10*3/uL — AB (ref 34.80–93.90)
WBC: 5.6 10*3/uL (ref 4.0–10.3)

## 2017-02-06 LAB — CEA (IN HOUSE-CHCC): CEA (CHCC-IN HOUSE): 3.11 ng/mL (ref 0.00–5.00)

## 2017-02-06 MED ORDER — PROCHLORPERAZINE MALEATE 10 MG PO TABS
10.0000 mg | ORAL_TABLET | Freq: Four times a day (QID) | ORAL | Status: DC | PRN
  Administered 2017-02-06: 10 mg via ORAL

## 2017-02-06 MED ORDER — PROCHLORPERAZINE MALEATE 10 MG PO TABS
ORAL_TABLET | ORAL | Status: AC
  Filled 2017-02-06: qty 1

## 2017-02-06 MED ORDER — SODIUM CHLORIDE 0.9 % IV SOLN
2400.0000 mg/m2 | INTRAVENOUS | Status: DC
  Administered 2017-02-06: 3950 mg via INTRAVENOUS
  Filled 2017-02-06: qty 79

## 2017-02-06 NOTE — Progress Notes (Signed)
[place]  [phone] [phone]  Clinic Follow up Note   Patient Care Team: [name], MD as PCP - General (Internal Medicine) Truitt Merle, MD as Consulting Physician (Hematology) Kyung Rudd, MD as Consulting Physician (Radiation Oncology) [name], MD as Consulting Physician (Cardiothoracic Surgery) Karie Mainland, RD as Dietitian (Nutrition) Minus Breeding, MD as Consulting Physician (Cardiology)   CHIEF COMPLAINTS:  Follow up metastatic esophageal cancer    Oncology History   Presented with solid dysphagia at least daily with chest pain. Involuntary weight loss of 10-12 lb over past year  Malignant neoplasm of lower third of esophagus (HCC)   Staging form: Esophagus - Adenocarcinoma, AJCC 7th Edition   - Clinical stage from 12/15/2015: Stage IIIA (T3, N1, M0) - Signed by Truitt Merle, MD on 12/24/2015   - Pathologic stage from 04/26/2016: yT0, N0, cM0, GX - Signed by [name], MD on 04/27/2016      Malignant neoplasm of lower third of esophagus ([place])   11/23/2015 Procedure    EGD per [facility](Dr. Toledo):6cm mass in lower third of esophagus      11/24/2015 Initial Diagnosis    Esophageal cancer ([place])      11/24/2015 Pathology Results    Mucinous adenocarcinoma, moderately differentiated-Her2 analysis pending      12/05/2015 Imaging    PET scan showed hypermetabolic a mass in distal esophagus, hypermetabolic activity in the left anterior prostate gland, metabolic density in left adrenal nodule, CT attenuation suggest a benign adrenal adenoma. Left apical 2 mm lung nodule.      12/15/2015 Procedure    EUS showed a uT3N1 lesion from 27cm to 35cm from the incisors       12/26/2015 - 02/02/2016 Chemotherapy    Weekly carboplatin AUC 2 and Taxol 45 mg/m, with concurrent radiation      12/26/2015 - 02/02/2016 Radiation Therapy    Neoadjuvant chemoradiation to the esophageal cancer      04/23/2016  Surgery    total esophagectomy with cervical esophagogastrostomy, pyloroplasty, feeding jejunostomy       04/23/2016 Pathology Results    Esophagectomy showed no residual tumor, 8 lymph nodes were all negative, incidental leiomyoma 0.2 cm      08/14/2016 PET scan    IMPRESSION: 1. Small hypermetabolic left adrenal mass, this has not changed in size compared to 12/01/15 but has a maximum standard uptake value of 6.2 (previously 4.2). Indeterminate density and MRI characteristics, not specific for adenoma. Early metastatic disease is not readily excluded although the lack of change of size is somewhat reassuring. Surveillance recommended. 2. No other hypermetabolic lesions are identified. 3. Moderate to large left pleural effusion with passive atelectasis. 4. Esophagectomy with gastric pull-through. 5. No hypermetabolic liver lesions are seen. 6. Enlarged prostate gland.      08/16/2016 - 08/23/2016 Hospital Admission    He was diagnosed with DIC, unclear etiology. He was given blood products. He underwent echocardiogram which was negative for endocarditis, CTA chest/abd which was negative for aneurysm or dissection as work up for DIC. He was on prednisone for some time, but stopped as it was not helping much for DIC.       08/28/2016 - 09/01/2016 Hospital Admission    He has had issues with recurrent L sided pleural effusions, DIC and melena. He was last admitted in 1/25, given supportive care with a thoracentesis and blood transfusion. Dr Burr Medico has been following DIC and has requested a direct admit for melena,  anemia and thrombocytopenia. GI called for EGD.        08/31/2016 Pathology Results    Bone Marrow Biopsy, right iliac - METASTATIC ADENOCARCINOMA. - SEE COMMENT. PERIPHERAL BLOOD: - NORMOCYTIC ANEMIA. - THROMBOCYTOPENIA.      08/31/2016 Progression    Bone marrow biopsy showed metastatic adenocarcinoma      09/04/2016 -  Chemotherapy    FOLFOX every 2 weeks       09/19/2016  Procedure    Therapeutic thoracentesis of a left pleural effusion performed on 09/19/16 yielded 1.5 liters of blood-tinged fluid. Reactive appearing mesothelial cells were present.      11/12/2016 PET scan     IMPRESSION: 1. Heterogeneous bony uptake with several mildly avid foci primarily in the lower thoracic spine, lumbar spine, and pelvic bones consistent with known bony metastatic disease. 2. There is new uptake in the right adrenal gland not seen previously with no CT correlate. Recommend attention on follow-up. 3. A rounded focus of uptake in the anterior peripheral left prostate is larger in the interval. The findings are nonspecific and could be infectious, inflammatory, or neoplastic. Focal prostatitis or prostate cancer are most likely. 4. The uptake in the left adrenal gland has decreased in the interval. 5. Mild uptake in the bilateral hila may simply represent reactive nodes. Recommend attention on follow-up. 6. The left-sided pleural effusion is a little larger in the interval with loculated components.       HISTORY OF PRESENTING ILLNESS (12/14/2015):  Fred [name] 76 y.o. male is here because of His newly diagnosed esophageal cancer. He is accompanied by his wife and daughter to my clinic today.  He has had dysphagia for 2 months, mainly with solid food, no dysphagia with soft food or liquid. He also has mild pain in mid chest when he swallows. No nausea, abdominal pain, or other discomfort. He has lost about 15 lbs in the last year, no other symoptoms. He was seen by his primary care physician at [facility], and referred to gastroenterologist Dr. Alice Reichert. He underwent EGD on 11/23/2015, which showed a 6 cm mass in the lower third of esophagus. Biopsy showed adenocarcinoma, moderately differentiated. He was referred to [place] to consider neoadjuvant therapy. He is scheduled to see a Psychologist, sport and exercise at [facility] later this week.  He has had some urinary frequency, underwent TURP on  11/02/2015.   CURRENT THERAPY: first line chemo FOLFOX every 2 weeks started on 09/04/2016  INTERIM HISTORY: Fred [name] returns for follow up and treatment. I saw him in the infusion room today. He has developed numbness and tingling on his fingers and toes, since a week ago. It is mild to moderate. He denies significant pain. His hand function are normal. No history of fall at home. He otherwise is doing well, still on tube feeds at night, eating well, weight is stable. No other pain or other new complaints. He is tolerating chemotherapy otherwise well.    MEDICAL HISTORY:  Past Medical History:  Diagnosis Date  . Esophageal cancer ([place]) 11/23/15   lower 3rd esohagus   . GERD (gastroesophageal reflux disease)   . Hyperlipidemia   . Hypertension     SURGICAL HISTORY: Past Surgical History:  Procedure Laterality Date  . CHEST TUBE INSERTION Left 04/23/2016   Procedure: CHEST TUBE INSERTION;  Surgeon: [name], MD;  Location: [place];  Service: Thoracic;  Laterality: Left;  . COLONOSCOPY N/A 08/30/2016   Procedure: COLONOSCOPY;  Surgeon: Milus Banister, MD;  Location:  [facility];  Service: Endoscopy;  Laterality: N/A;  . COMPLETE ESOPHAGECTOMY N/A 04/23/2016   Procedure: TRANSHIATIAL TOTAL ESOPHAGECTOMY COMPLETE; CERVICAL ESOPHAGOGASTROSTOMY AND PYLOROPLASTY;  Surgeon: [name], MD;  Location: [place];  Service: Thoracic;  Laterality: N/A;  . cystoscope  4/12/1   prostate  . ERCP    . ESOPHAGOGASTRODUODENOSCOPY (EGD) WITH PROPOFOL N/A 08/29/2016   Procedure: ESOPHAGOGASTRODUODENOSCOPY (EGD) WITH PROPOFOL;  Surgeon: Milus Banister, MD;  Location: [facility];  Service: Endoscopy;  Laterality: N/A;  . INGUINAL HERNIA REPAIR    . IR GENERIC HISTORICAL  08/31/2016   IR FLUORO GUIDE PORT INSERTION RIGHT 08/31/2016 Corrie Mckusick, DO [facility]-INTER[facility] RAD  . IR GENERIC HISTORICAL  08/31/2016   IR US GUIDE VASC ACCESS RIGHT 08/31/2016 Corrie Mckusick, DO [facility]  . JEJUNOSTOMY N/A 04/23/2016    Procedure: FEEDING JEJUNOSTOMY;  Surgeon: [name], MD;  Location: [place];  Service: Thoracic;  Laterality: N/A;  . LEG SURGERY Left    hole  . PROSTATE BIOPSY  10/05/15  . right shoulder surgery    . VIDEO BRONCHOSCOPY N/A 04/23/2016   Procedure: VIDEO BRONCHOSCOPY;  Surgeon: [name], MD;  Location[facility];  Service: Thoracic;  Laterality: N/A;    SOCIAL HISTORY: Social History   Social History  . Marital status: Married    Spouse name: N/A  . Number of children: 1  . Years of education: N/A   Occupational History  . Truck Geophysicist/field seismologist    Social History Main Topics  . Smoking status: Former Smoker    Packs/day: 1.00    Years: 10.00    Quit date: 07/24/1967  . Smokeless tobacco: Never Used  . Alcohol use No  . Drug use: No  . Sexual activity: Not Currently   Other Topics Concern  . Not on file   Social History Narrative  . No narrative on file    FAMILY HISTORY: Family History  Problem Relation Age of Onset  . Cancer Maternal Grandfather 96       gastric cancer   . Alzheimer's disease Mother   . Diabetes Mother   . Stroke Father 20  . Multiple sclerosis Sister     ALLERGIES:  is allergic to no known allergies.  MEDICATIONS:  Current Outpatient Prescriptions  Medication Sig Dispense Refill  . acetaminophen (TYLENOL) 325 MG tablet Take 650 mg by mouth every 6 (six) hours as needed.    Marland Kitchen atenolol (TENORMIN) 25 MG tablet Take 1 tab in the morning and half tab in the evening (Patient taking differently: Take 12.5-25 mg by mouth 2 (two) times daily. Take '25mg'$  in the morning and 12.'5mg'$  in the evening) 60 tablet 1  . folic acid (FOLVITE) 1 MG tablet Take 1 tablet (1 mg total) by mouth daily. (Patient not taking: Reported on 01/24/2017) 30 tablet 0  . lidocaine-prilocaine (EMLA) cream Apply 1 application topically as needed. Apply 1-2 tsp over port site 1 hour prior to chemotherapy 30 g 1  . mirtazapine (REMERON) 15 MG tablet Take 1 tablet (15 mg total) by mouth  at bedtime. 30 tablet 2  . Nutritional Supplements (NUTREN 1.5) LIQD Take 250 mLs by mouth at bedtime. 3 CANS VIA FEEDING TUBE    . ondansetron (ZOFRAN ODT) 8 MG disintegrating tablet Take 1 tablet (8 mg total) by mouth every 8 (eight) hours as needed for nausea or vomiting. (Patient not taking: Reported on 01/24/2017) 30 tablet 2  . oxycodone (OXY-IR) 5 MG capsule Take 5 mg by  mouth every 6 (six) hours as needed for pain.     Marland Kitchen prochlorperazine (COMPAZINE) 10 MG tablet Take 1 tablet (10 mg total) by mouth every 6 (six) hours as needed for nausea or vomiting. 30 tablet 3  . vitamin B-12 1000 MCG tablet Take 1 tablet (1,000 mcg total) by mouth daily. (Patient not taking: Reported on 01/24/2017) 30 tablet 0  . Water For Irrigation, Sterile (FREE WATER) SOLN Place 220 mLs into feeding tube 4 (four) times daily.     No current facility-administered medications for this visit.     REVIEW OF SYSTEMS:   Constitutional: Denies fevers, chills or abnormal night sweats  Eyes: Denies blurriness of vision, double vision or watery eyes Ears, nose, mouth, throat, and face: Denies mucositis or sore throat Respiratory: Denies cough, dyspnea or wheezes Cardiovascular: Denies palpitation, chest discomfort or lower extremity swelling Gastrointestinal:  Denies heartburn or change in bowel habits (+) Drainage around J-tube. (+) some bleeding from feeding tube  Skin: Denies abnormal skin rashes Lymphatics: Denies new lymphadenopathy or easy bruising Neurological:Denies numbness, tingling or new weaknesses Behavioral/Psych: Mood is stable, no new changes  Musculoskeletal: All other systems were reviewed with the patient and are negative.  PHYSICAL EXAMINATION:  ECOG PERFORMANCE STATUS: 1 Blood pressure 123/56, heart rate 84, respiratory rate 18, temperature 36.9, pulse ox 100% GENERAL:alert, no distress and comfortable, in wheelchair SKIN: Appears to be pale, skin texture, turgor are normal, no rashes or  significant lesions EYES: normal, conjunctiva are pink and non-injected, sclera clear OROPHARYNX:no exudate, no erythema and lips, buccal mucosa, and tongue normal  NECK: supple, thyroid normal size, non-tender, without nodularity LYMPH:  no palpable lymphadenopathy in the cervical, axillary or inguinal LUNGS: clear to auscultation and percussion with normal breathing effort HEART: regular rate & rhythm and no murmurs and no lower extremity edema ABDOMEN:abdomen soft, non-tender and normal bowel sounds, J tube in place. No skin erythema or pus.(+) Feeding tube site with small bleeding around feeding tube site, no discharge. No skin erythema around J tube. Normal mucosa  Musculoskeletal:no cyanosis of digits and no clubbing  PSYCH: alert & oriented x 3 with fluent speech NEURO: no focal motor/sensory deficits  LABORATORY DATA:  I have reviewed the data as listed CBC Latest Ref Rng & Units 02/06/2017 01/24/2017 01/09/2017  WBC 4.0 - 10.3 10e3/uL 5.6 4.7 5.1  Hemoglobin 13.0 - 17.1 g/dL 12.0(L) 11.9(L) 12.2(L)  Hematocrit 38.4 - 49.9 % 34.9(L) 35.0(L) 35.4(L)  Platelets 140 - 400 10e3/uL 153 163 156   CMP Latest Ref Rng & Units 02/06/2017 01/24/2017 01/09/2017  Glucose 70 - 140 mg/dl 100 110 112  BUN 7.0 - 26.0 mg/dL 9.0 10.7 10.2  Creatinine 0.7 - 1.3 mg/dL 0.6(L) 0.6(L) 0.6(L)  Sodium 136 - 145 mEq/L 136 139 141  Potassium 3.5 - 5.1 mEq/L 4.2 4.2 4.3  Chloride 101 - 111 mmol/L - - -  CO2 22 - 29 mEq/L '23 24 23  '$ Calcium 8.4 - 10.4 mg/dL 9.3 9.3 9.3  Total Protein 6.4 - 8.3 g/dL 6.3(L) 6.3(L) 6.4  Total Bilirubin 0.20 - 1.20 mg/dL 0.45 0.37 0.40  Alkaline Phos 40 - 150 U/L 158(H) 164(H) 161(H)  AST 5 - 34 U/L '25 26 25  '$ ALT 0 - 55 U/L '14 13 15    '$ Results for Fred [name], Fred [name] (MRN [id number]) as of 01/22/2017 13:59  Ref. Range 12/12/2016 12:01 12/12/2016 12:02 01/09/2017 09:10  CEA ([facility]In House) Latest Ref Range: 0.00 - 5.00 ng/mL 4.33  2.88  Prostate  Specific Ag, Serum Latest Ref Range: 0.0 -  4.0 ng/mL  3.0      Pathology report [facility] ONE TESTING 09/29/2016   PD-L1 Testing 09/29/2016   Diagnosis 09/27/2016 Consult- Comprehensive, [id number] - 1A Mass, Lower third of Esophagus, Mucosal Biopsy - ADENOCARCINOMA WITH EXTRACELLULAR MUCIN. - SEE COMMENT. Microscopic Comment Provided Her2 IHC with the appropriate controls is negative. Per report Her2 FISH is also negative. There is limited tissue remaining, but Foundation One and PDL-1 testing will be attempted as requested.  Diagnosis 09/19/2016 PLEURAL FLUID, LEFT (SPECIMEN 1 OF 1 COLLECTED 09/19/16): REACTIVE APPEARING MESOTHELIAL CELLS PRESENT.  Diagnosis 08/31/2016 Bone Marrow Biopsy, right iliac - METASTATIC ADENOCARCINOMA. - SEE COMMENT. PERIPHERAL BLOOD: - NORMOCYTIC ANEMIA. - THROMBOCYTOPENIA.  Diagnosis 08/02/16 PLEURAL FLUID, LEFT (SPECIMEN 1 OF 1 COLLECTED 08/02/16): REACTIVE MESOTHELIAL CELLS PRESENT. LYMPHOCYTES PRESENT.  Diagnosis 11/23/2015 Mass, lower third of the esophagus, mucosal biopsy Mucinous adenocarcinoma, moderately differentiated.  Diagnosis 04/23/2016 1. Lymph node, biopsy, Cervical - ONE OF ONE LYMPH NODES NEGATIVE FOR CARCINOMA (0/1). 2. Lymph node, biopsy, Periesophageal - BLOOD WITH SCANT BENIGN SOFT TISSUE AND SKELETAL MUSCLE. - NO MALIGNANCY IDENTIFIED. 3. Esophagogastrectomy - ULCERATION WITH INFLAMMATION AND REACTIVE CHANGES. - FOCAL INTESTINAL METAPLASIA. - CHANGES CONSISTENT WITH TREATMENT EFFECT. - SEVEN OF SEVEN LYMPH NODES NEGATIVE FOR CARCINOMA (0/7). - INCIDENTAL LEIOMYOMA, 0.2 CM. - MARGINS ARE NEGATIVE FOR DYSPLASIA OR MALIGNANCY. - SEE ONCOLOGY TABLE. Microscopic Comment 3. ESOPHAGUS: Specimen: Esophagus and proximal stomach, cervical lymph node. Procedure: Esophagogastrectomy and cervical lymph node biopsy. Tumor Site: Presumed GE junction, see comment. Relationship of Tumor to esophagogastric junction: Can not be assessed. Distance of tumor center from  esophagogastric junction: Can not be assessed. Tumor Size Greatest dimension: No residual tumor present. Histologic Type: Per medical record adenocarcinoma (no biopsy for review). Histologic Grade: N/A. Microscopic Tumor Extension: N/A. Margins: Negative. Treatment Effect: Present (TRS 0). Lymph-Vascular Invasion: Not identified. Perineural Invasion: Not identified. Lymph nodes: number examined 8; number positive: 0 TNM: ypT0, ypN0 see comment. 1 of 3 FINAL for Fred [name], Fred [name] ([id number]) Microscopic Comment(continued) Ancillary studies: None. Comments: The entire GE junction is submitted for evaluation. There is ulceration with associated inflammation. There is acellular mucin/myxoid changes which extend through the muscularis propria, but no viable/residual tumor is identified and thus the stage is ypT0. There is focal background intestinal metaplasia, which may have been pre-existing (Barrett's esophagus) or related to therapy.   RADIOGRAPHIC STUDIES: I have personally reviewed the radiological images as listed and agreed with the findings in the report. Dg Chest 2 View  Result Date: 01/17/2017 CLINICAL DATA:  Esophageal cancer treated with radiation and chemotherapy. Evaluate pleural effusion. EXAM: CHEST  2 VIEW COMPARISON:  12/13/2016 and previous FINDINGS: Power port inserted on the right has its tip in the SVC above the right atrium. Right chest again shows minimal blunting of the posterior costophrenic angle but is otherwise clear. On the left, there is a moderate effusion layering dependently, similar to the previous exam, probably slightly reduced. Dependent pulmonary atelectasis associated with that. No acute bone finding. Partial compression fracture of L1 as seen previously. IMPRESSION: Persistent left effusion, probably slightly smaller than on the previous exam. Tiny amount of fluid in the posterior costophrenic angle on the right. Electronically Signed   By: Paulina Fusi  M.D.   On: 01/17/2017 16:12   PET 08/14/2016 IMPRESSION: 1. Small hypermetabolic left adrenal mass, this has not changed in size compared to 12/01/15 but has a maximum standard uptake value of 6.2 (previously  4.2). Indeterminate density and MRI characteristics, not specific for adenoma. Early metastatic disease is not readily excluded although the lack of change of size is somewhat reassuring. Surveillance recommended. 2. No other hypermetabolic lesions are identified. 3. Moderate to large left pleural effusion with passive atelectasis. 4. Esophagectomy with gastric pull-through. 5. No hypermetabolic liver lesions are seen. 6. Enlarged prostate gland.  CT angio chest/abdomen/pelvis for dissection w/wo contrast 08/22/2016 IMPRESSION: No evidence of aortic aneurysm or dissection. No evidence of pulmonary embolus. Heart is borderline in size. Stable small left adrenal nodule which was hypermetabolic on prior PET CT. Stable large left pleural effusion with compressive atelectasis in the left lower lobe. Stable appearance of the gastric pull-through post esophagectomy.  US Thoracentesis asp pleural space 09/19/2016 IMPRESSION: Successful ultrasound guided diagnostic and therapeutic left thoracentesis yielding 1.5 liters of pleural fluid.  PET 11/12/2016 IMPRESSION: 1. Heterogeneous bony uptake with several mildly avid foci primarily in the lower thoracic spine, lumbar spine, and pelvic bones consistent with known bony metastatic disease. 2. There is new uptake in the right adrenal gland not seen previously with no CT correlate. Recommend attention on follow-up. 3. A rounded focus of uptake in the anterior peripheral left prostate is larger in the interval. The findings are nonspecific and could be infectious, inflammatory, or neoplastic. Focal prostatitis or prostate cancer are most likely. 4. The uptake in the left adrenal gland has decreased in the interval. 5. Mild uptake in the  bilateral hila may simply represent reactive nodes. Recommend attention on follow-up. 6. The left-sided pleural effusion is a little larger in the interval with loculated components.   ASSESSMENT & PLAN:  76 y.o. male presented with dysphagia with solid food  1. Distal esophageal adenocarcinoma, uT3N1M0, stage IIIA, ypT0N0M0, diffuse bone metastasis 08/2016, HER2 unknown -I previously reviewed his CT scan, PET scan, EGD, and the biopsy findings with patient and his family member in details. -The initial PET scan showed no definitive distant metastasis. 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 This will be followed in the future scan. -By EUS, he had T3 N1 stage III disease. -He completed neoadjuvant radiation with weekly Carbo and Taxol. -I previously reviewed his surgical pathology findings, he has had complete pathologic response to neoadjuvant chemotherapy and radiation, which predicts good prognosis -Unfortunately he developed diffuse bone metastasis in January 2018, confirmed by bone marrow biopsy -He has started first-line chemotherapy FOLFOX, tolerating well, and his anemia and some cytopenia has previously improved since chemotherapy -The goal of therapy is palliative -I have requested HER2 IHC test but was not sufficient tissue -his tumor was negative for PD-L1 expression, he is not a candidate for immunotherapy -I previously reviewed his Foundation One genomic testing results, unfortunately there is no good targeted therapy available. His tumor has MSI stable -He has clinically been doing well overall, has required blood transfusion (2 PRBCs) on 09/28/16 since he started chemotherapy, previously no significant noticeable side effects from chemotherapy, he has gained some weight. We will continue chemotherapy every 2 weeks. -CEA has decreased significant since starting FOLFOX in February 2018. - restaging PET Scan taken  11/12/16 were reviewed with pt and his family.  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 I also discussed the option of maintenance Xeloda, he opted 5-fu  -We discussed the role of bone marrow biopsy to see if he has achieved complete response, I may consider this in the next 3-6 months. If he is clinically doing well, with stable CBC and no evidence of DIC, will continue 5-FU maintenance therapy. -Labs adequate for treatment today, he is clinically doing well, tolerating chemo very well, will continue  -repeat PET scan in the next two weeks    2. 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 thrombocytopenia has resolved. -The patient had many blood transfusions with the last on 09/28/16, he has not required any blood transfusion since he started chemotherapy. -Continue monitoring CBC  3. 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  -He has had 3 repeated thoracentesis, all cytology were negative, -Therapeutic thoracentesis performed on 09/19/16 yielded 1.5 liters of blood-tinged fluid. Reactive appearing mesothelial cells were present. -Continue monitoring, previously repeated CXR -He was previously scheduled to follow-up with Dr. Pia Mau   4. HTN -Continue medication and follow-up of his primary care physician -his PCP at [facility] has changed his BP meds lately   5. Malnutrition -Doing better, he is tolerating J-tube feeding very well, he eats normally. Encouraged the patient to eat small frequent meals. -he has decreased his tube feeds lately  -Follow  up with dietitian -Feeding tube site is clean and no bleeding lately   6. Anorexia and depression  -continue mirtazapine. Overall, previously improved lately  -He previously hasn't talked to the nutritionist in a while, he will follow up   7. Fatigue -He would like something to help his energy. -We previously discussed steroids to help for this, but I told them that this is not a long term solution -He would not like steroids at this time.  -We previously discussed fatigue being related to chemo or anemia.  -I previously encouraged him to eat well with protein and be more active   8. Goal of care discussion  -We previously discussed the incurable nature of his cancer, and the overall poor prognosis, especially if he does not have good response to chemotherapy or progress on chemo -The patient understands the goal of care is palliative. -I recommend DNR/DNI, he will think about it   9. 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 -f/u in 2 weeks with restaging PET   All questions were answered. The patient knows to call the clinic with any problems, questions or concerns.  I spent 25 minutes counseling the patient face to face. The total time spent in the appointment was 30 minutes and more than 50% was on counseling.   Truitt Merle, MD 02/06/2017   This document serves as a record of services personally performed by Truitt Merle, MD. It was created on her behalf by Joslyn Devon, a trained medical scribe. The creation of this record is based on the scribe's personal observations and the provider's statements to them. This document  has been checked and approved by the attending provider.

## 2017-02-06 NOTE — Patient Instructions (Addendum)
[facility] Discharge Instructions for Patients Receiving Chemotherapy  Today you received the following chemotherapy agents: Adrucil   To help prevent nausea and vomiting after your treatment, we encourage you to take your nausea medication as directed.    If you develop nausea and vomiting that is not controlled by your nausea medication, call the clinic.   BELOW ARE SYMPTOMS THAT SHOULD BE REPORTED IMMEDIATELY:  *FEVER GREATER THAN 100.5 F  *CHILLS WITH OR WITHOUT FEVER  NAUSEA AND VOMITING THAT IS NOT CONTROLLED WITH YOUR NAUSEA MEDICATION  *UNUSUAL SHORTNESS OF BREATH  *UNUSUAL BRUISING OR BLEEDING  TENDERNESS IN MOUTH AND THROAT WITH OR WITHOUT PRESENCE OF ULCERS  *URINARY PROBLEMS  *BOWEL PROBLEMS  UNUSUAL RASH Items with * indicate a potential emergency and should be followed up as soon as possible.  Feel free to call the clinic you have any questions or concerns. The clinic phone number is [phone].  Please show the CHEMO ALERT CARD at check-in to the Emergency Department and triage nurse.   

## 2017-02-06 NOTE — Progress Notes (Signed)
Patient has neuropathy.  Change from FOLFOX to 5-FU pump only (dose increase).

## 2017-02-07 ENCOUNTER — Encounter: Payer: Self-pay | Admitting: Hematology

## 2017-02-08 ENCOUNTER — Ambulatory Visit (HOSPITAL_BASED_OUTPATIENT_CLINIC_OR_DEPARTMENT_OTHER): Payer: No Typology Code available for payment source

## 2017-02-08 DIAGNOSIS — C155 Malignant neoplasm of lower third of esophagus: Secondary | ICD-10-CM

## 2017-02-08 DIAGNOSIS — Z452 Encounter for adjustment and management of vascular access device: Secondary | ICD-10-CM | POA: Diagnosis not present

## 2017-02-08 DIAGNOSIS — Z95828 Presence of other vascular implants and grafts: Secondary | ICD-10-CM

## 2017-02-08 MED ORDER — SODIUM CHLORIDE 0.9% FLUSH
10.0000 mL | INTRAVENOUS | Status: DC | PRN
  Administered 2017-02-08: 10 mL via INTRAVENOUS
  Filled 2017-02-08: qty 10

## 2017-02-08 MED ORDER — HEPARIN SOD (PORK) LOCK FLUSH 100 UNIT/ML IV SOLN
500.0000 [IU] | Freq: Once | INTRAVENOUS | Status: AC
  Administered 2017-02-08: 500 [IU] via INTRAVENOUS
  Filled 2017-02-08: qty 5

## 2017-02-08 NOTE — Patient Instructions (Signed)

## 2017-02-19 ENCOUNTER — Ambulatory Visit (HOSPITAL_COMMUNITY)
Admission: RE | Admit: 2017-02-19 | Discharge: 2017-02-19 | Disposition: A | Discharge status: Home / Self Care | Payer: Non-veteran care | Source: Ambulatory Visit | Attending: Hematology | Admitting: Hematology

## 2017-02-19 DIAGNOSIS — J90 Pleural effusion, not elsewhere classified: Secondary | ICD-10-CM | POA: Diagnosis not present

## 2017-02-19 DIAGNOSIS — R938 Abnormal findings on diagnostic imaging of other specified body structures: Secondary | ICD-10-CM | POA: Diagnosis not present

## 2017-02-19 DIAGNOSIS — C155 Malignant neoplasm of lower third of esophagus: Secondary | ICD-10-CM

## 2017-02-19 LAB — GLUCOSE, CAPILLARY: GLUCOSE-CAPILLARY: 123 mg/dL — AB (ref 65–99)

## 2017-02-19 MED ORDER — FLUDEOXYGLUCOSE F - 18 (FDG) INJECTION
6.6000 | Freq: Once | INTRAVENOUS | Status: AC | PRN
  Administered 2017-02-19: 6.6 via INTRAVENOUS

## 2017-02-20 ENCOUNTER — Ambulatory Visit (HOSPITAL_BASED_OUTPATIENT_CLINIC_OR_DEPARTMENT_OTHER): Payer: No Typology Code available for payment source | Admitting: Hematology

## 2017-02-20 ENCOUNTER — Encounter: Payer: Self-pay | Admitting: Hematology

## 2017-02-20 ENCOUNTER — Ambulatory Visit (HOSPITAL_BASED_OUTPATIENT_CLINIC_OR_DEPARTMENT_OTHER): Payer: No Typology Code available for payment source

## 2017-02-20 ENCOUNTER — Ambulatory Visit: Payer: No Typology Code available for payment source

## 2017-02-20 ENCOUNTER — Other Ambulatory Visit (HOSPITAL_BASED_OUTPATIENT_CLINIC_OR_DEPARTMENT_OTHER): Payer: Non-veteran care

## 2017-02-20 ENCOUNTER — Telehealth: Payer: Self-pay | Admitting: Hematology

## 2017-02-20 VITALS — BP 113/65 | HR 85 | Temp 98.3°F | Resp 18 | Ht 65.0 in | Wt 134.3 lb

## 2017-02-20 DIAGNOSIS — C155 Malignant neoplasm of lower third of esophagus: Secondary | ICD-10-CM | POA: Diagnosis not present

## 2017-02-20 DIAGNOSIS — Z5111 Encounter for antineoplastic chemotherapy: Secondary | ICD-10-CM

## 2017-02-20 DIAGNOSIS — C7951 Secondary malignant neoplasm of bone: Secondary | ICD-10-CM | POA: Diagnosis not present

## 2017-02-20 DIAGNOSIS — I10 Essential (primary) hypertension: Secondary | ICD-10-CM | POA: Diagnosis not present

## 2017-02-20 DIAGNOSIS — J90 Pleural effusion, not elsewhere classified: Secondary | ICD-10-CM | POA: Diagnosis not present

## 2017-02-20 DIAGNOSIS — E440 Moderate protein-calorie malnutrition: Secondary | ICD-10-CM | POA: Diagnosis not present

## 2017-02-20 DIAGNOSIS — Z95828 Presence of other vascular implants and grafts: Secondary | ICD-10-CM

## 2017-02-20 LAB — COMPREHENSIVE METABOLIC PANEL
ALT: 11 U/L (ref 0–55)
AST: 21 U/L (ref 5–34)
Albumin: 3.2 g/dL — ABNORMAL LOW (ref 3.5–5.0)
Alkaline Phosphatase: 148 U/L (ref 40–150)
Anion Gap: 9 mEq/L (ref 3–11)
BUN: 9.5 mg/dL (ref 7.0–26.0)
CALCIUM: 9.2 mg/dL (ref 8.4–10.4)
CHLORIDE: 106 meq/L (ref 98–109)
CO2: 25 meq/L (ref 22–29)
Creatinine: 0.6 mg/dL — ABNORMAL LOW (ref 0.7–1.3)
EGFR: >90 mL/min/{1.73_m2} (ref >90)
Glucose: 112 mg/dl (ref 70–140)
POTASSIUM: 4 meq/L (ref 3.5–5.1)
SODIUM: 139 meq/L (ref 136–145)
Total Bilirubin: 0.42 mg/dL (ref 0.20–1.20)
Total Protein: 6.2 g/dL — ABNORMAL LOW (ref 6.4–8.3)

## 2017-02-20 LAB — CBC & DIFF AND RETIC
BASO%: 0.3 % (ref 0.0–2.0)
Basophils Absolute: 0 10*3/uL (ref 0.0–0.1)
EOS ABS: 0.1 10*3/uL (ref 0.0–0.5)
EOS%: 1.1 % (ref 0.0–7.0)
HCT: 34.9 % — ABNORMAL LOW (ref 38.4–49.9)
HEMOGLOBIN: 11.9 g/dL — AB (ref 13.0–17.1)
IMMATURE RETIC FRACT: 11.4 % — AB (ref 3.00–10.60)
LYMPH%: 13.8 % — AB (ref 14.0–49.0)
MCH: 33 pg (ref 27.2–33.4)
MCHC: 34.1 g/dL (ref 32.0–36.0)
MCV: 96.7 fL (ref 79.3–98.0)
MONO#: 0.9 10*3/uL (ref 0.1–0.9)
MONO%: 13.8 % (ref 0.0–14.0)
NEUT%: 71 % (ref 39.0–75.0)
NEUTROS ABS: 4.5 10*3/uL (ref 1.5–6.5)
Platelets: 161 10*3/uL (ref 140–400)
RBC: 3.61 10*6/uL — ABNORMAL LOW (ref 4.20–5.82)
RDW: 15 % — ABNORMAL HIGH (ref 11.0–14.6)
RETIC %: 2.8 % — AB (ref 0.80–1.80)
Retic Ct Abs: 101.08 10*3/uL — ABNORMAL HIGH (ref 34.80–93.90)
WBC: 6.4 10*3/uL (ref 4.0–10.3)
lymph#: 0.9 10*3/uL (ref 0.9–3.3)

## 2017-02-20 LAB — CEA (IN HOUSE-CHCC): CEA (CHCC-IN HOUSE): 2.89 ng/mL (ref 0.00–5.00)

## 2017-02-20 MED ORDER — PROCHLORPERAZINE MALEATE 10 MG PO TABS
ORAL_TABLET | ORAL | Status: AC
  Filled 2017-02-20: qty 1

## 2017-02-20 MED ORDER — PROCHLORPERAZINE MALEATE 10 MG PO TABS
10.0000 mg | ORAL_TABLET | Freq: Four times a day (QID) | ORAL | Status: DC | PRN
  Administered 2017-02-20: 10 mg via ORAL

## 2017-02-20 MED ORDER — SODIUM CHLORIDE 0.9 % IV SOLN
2400.0000 mg/m2 | INTRAVENOUS | Status: DC
  Administered 2017-02-20: 3950 mg via INTRAVENOUS
  Filled 2017-02-20: qty 79

## 2017-02-20 MED ORDER — SODIUM CHLORIDE 0.9% FLUSH
10.0000 mL | INTRAVENOUS | Status: DC | PRN
  Administered 2017-02-20: 10 mL via INTRAVENOUS
  Filled 2017-02-20: qty 10

## 2017-02-20 NOTE — Patient Instructions (Signed)

## 2017-02-20 NOTE — Patient Instructions (Signed)
[facility] Discharge Instructions for Patients Receiving Chemotherapy  Today you received the following chemotherapy agents: Adrucil   To help prevent nausea and vomiting after your treatment, we encourage you to take your nausea medication as directed.    If you develop nausea and vomiting that is not controlled by your nausea medication, call the clinic.   BELOW ARE SYMPTOMS THAT SHOULD BE REPORTED IMMEDIATELY:  *FEVER GREATER THAN 100.5 F  *CHILLS WITH OR WITHOUT FEVER  NAUSEA AND VOMITING THAT IS NOT CONTROLLED WITH YOUR NAUSEA MEDICATION  *UNUSUAL SHORTNESS OF BREATH  *UNUSUAL BRUISING OR BLEEDING  TENDERNESS IN MOUTH AND THROAT WITH OR WITHOUT PRESENCE OF ULCERS  *URINARY PROBLEMS  *BOWEL PROBLEMS  UNUSUAL RASH Items with * indicate a potential emergency and should be followed up as soon as possible.  Feel free to call the clinic you have any questions or concerns. The clinic phone number is [phone].  Please show the CHEMO ALERT CARD at check-in to the Emergency Department and triage nurse.   

## 2017-02-20 NOTE — Progress Notes (Signed)
[place]  [phone] [phone]  Clinic Follow up Note   Patient Care Team: [name], MD as PCP - General (Internal Medicine) Truitt Merle, MD as Consulting Physician (Hematology) Kyung Rudd, MD as Consulting Physician (Radiation Oncology) [name], MD as Consulting Physician (Cardiothoracic Surgery) Karie Mainland, RD as Dietitian (Nutrition) Minus Breeding, MD as Consulting Physician (Cardiology)   CHIEF COMPLAINTS:  Follow up metastatic esophageal cancer    Oncology History   Presented with solid dysphagia at least daily with chest pain. Involuntary weight loss of 10-12 lb over past year  Malignant neoplasm of lower third of esophagus (HCC)   Staging form: Esophagus - Adenocarcinoma, AJCC 7th Edition   - Clinical stage from 12/15/2015: Stage IIIA (T3, N1, M0) - Signed by Truitt Merle, MD on 12/24/2015   - Pathologic stage from 04/26/2016: yT0, N0, cM0, GX - Signed by [name], MD on 04/27/2016      Malignant neoplasm of lower third of esophagus ([place])   11/23/2015 Procedure    EGD per [facility](Dr. Toledo):6cm mass in lower third of esophagus      11/24/2015 Initial Diagnosis    Esophageal cancer ([place])      11/24/2015 Pathology Results    Mucinous adenocarcinoma, moderately differentiated-Her2 analysis pending      12/05/2015 Imaging    PET scan showed hypermetabolic a mass in distal esophagus, hypermetabolic activity in the left anterior prostate gland, metabolic density in left adrenal nodule, CT attenuation suggest a benign adrenal adenoma. Left apical 2 mm lung nodule.      12/15/2015 Procedure    EUS showed a uT3N1 lesion from 27cm to 35cm from the incisors       12/26/2015 - 02/02/2016 Chemotherapy    Weekly carboplatin AUC 2 and Taxol 45 mg/m, with concurrent radiation      12/26/2015 - 02/02/2016 Radiation Therapy    Neoadjuvant chemoradiation to the esophageal cancer      04/23/2016  Surgery    total esophagectomy with cervical esophagogastrostomy, pyloroplasty, feeding jejunostomy       04/23/2016 Pathology Results    Esophagectomy showed no residual tumor, 8 lymph nodes were all negative, incidental leiomyoma 0.2 cm      08/14/2016 PET scan    IMPRESSION: 1. Small hypermetabolic left adrenal mass, this has not changed in size compared to 12/01/15 but has a maximum standard uptake value of 6.2 (previously 4.2). Indeterminate density and MRI characteristics, not specific for adenoma. Early metastatic disease is not readily excluded although the lack of change of size is somewhat reassuring. Surveillance recommended. 2. No other hypermetabolic lesions are identified. 3. Moderate to large left pleural effusion with passive atelectasis. 4. Esophagectomy with gastric pull-through. 5. No hypermetabolic liver lesions are seen. 6. Enlarged prostate gland.      08/16/2016 - 08/23/2016 Hospital Admission    He was diagnosed with DIC, unclear etiology. He was given blood products. He underwent echocardiogram which was negative for endocarditis, CTA chest/abd which was negative for aneurysm or dissection as work up for DIC. He was on prednisone for some time, but stopped as it was not helping much for DIC.       08/28/2016 - 09/01/2016 Hospital Admission    He has had issues with recurrent L sided pleural effusions, DIC and melena. He was last admitted in 1/25, given supportive care with a thoracentesis and blood transfusion. Dr Burr Medico has been following DIC and has requested a direct admit for melena,  anemia and thrombocytopenia. GI called for EGD.        08/31/2016 Pathology Results    Bone Marrow Biopsy, right iliac - METASTATIC ADENOCARCINOMA. - SEE COMMENT. PERIPHERAL BLOOD: - NORMOCYTIC ANEMIA. - THROMBOCYTOPENIA.      08/31/2016 Progression    Bone marrow biopsy showed metastatic adenocarcinoma      09/04/2016 -  Chemotherapy    FOLFOX every 2 weeks; changed to  maintenance 5-FU every 2 weeks starting 02/06/17      09/19/2016 Procedure    Therapeutic thoracentesis of a left pleural effusion performed on 09/19/16 yielded 1.5 liters of blood-tinged fluid. Reactive appearing mesothelial cells were present.      11/12/2016 PET scan     IMPRESSION: 1. Heterogeneous bony uptake with several mildly avid foci primarily in the lower thoracic spine, lumbar spine, and pelvic bones consistent with known bony metastatic disease. 2. There is new uptake in the right adrenal gland not seen previously with no CT correlate. Recommend attention on follow-up. 3. A rounded focus of uptake in the anterior peripheral left prostate is larger in the interval. The findings are nonspecific and could be infectious, inflammatory, or neoplastic. Focal prostatitis or prostate cancer are most likely. 4. The uptake in the left adrenal gland has decreased in the interval. 5. Mild uptake in the bilateral hila may simply represent reactive nodes. Recommend attention on follow-up. 6. The left-sided pleural effusion is a little larger in the interval with loculated components.       HISTORY OF PRESENTING ILLNESS (12/14/2015):  Fred [name] 76 y.o. male is here because of His newly diagnosed esophageal cancer. He is accompanied by his wife and daughter to my clinic today.  He has had dysphagia for 2 months, mainly with solid food, no dysphagia with soft food or liquid. He also has mild pain in mid chest when he swallows. No nausea, abdominal pain, or other discomfort. He has lost about 15 lbs in the last year, no other symoptoms. He was seen by his primary care physician at [facility], and referred to gastroenterologist Dr. Alice Reichert. He underwent EGD on 11/23/2015, which showed a 6 cm mass in the lower third of esophagus. Biopsy showed adenocarcinoma, moderately differentiated. He was referred to [place] to consider neoadjuvant therapy. He is scheduled to see a Psychologist, sport and exercise at [facility] later  this week.  He has had some urinary frequency, underwent TURP on 11/02/2015.   CURRENT THERAPY: first line chemo FOLFOX every 2 weeks started on 09/04/2016; changed to maintenance 5-FU every 2 weeks starting 02/06/17.  INTERIM HISTORY:  Mr. Marrow returns for follow up and treatment. He presents to the clinic today with his wife. He reports to having tingling and numbness in his hands. He has no problems with his hand function and his feet are effected as well but he is able to feel the ground. His wife says he takes more nausea pills when the pump DC.  He takes nausea pills when he eats and feels as if he is going to vomit. He is tube feeding every night for 12-12.5 hours. He eats by mouth during the day with no issue.  They use to travel for months in their camper but has not in the last 2 years. His wife says he is really weak and she does not think they can travel anywhere.  His brother in-law recently died form leukemia and the wife reports that it is taking a toll on both of them. He bleeds a little from  his feeding tube but overall well. His wife says Dr. Milton Ferguson told them he had less fluid in his lungs.     MEDICAL HISTORY:  Past Medical History:  Diagnosis Date  . Esophageal cancer ([place]) 11/23/15   lower 3rd esohagus   . GERD (gastroesophageal reflux disease)   . Hyperlipidemia   . Hypertension     SURGICAL HISTORY: Past Surgical History:  Procedure Laterality Date  . CHEST TUBE INSERTION Left 04/23/2016   Procedure: CHEST TUBE INSERTION;  Surgeon: [name], MD;  Location: [place];  Service: Thoracic;  Laterality: Left;  . COLONOSCOPY N/A 08/30/2016   Procedure: COLONOSCOPY;  Surgeon: Milus Banister, MD;  Location: [facility];  Service: Endoscopy;  Laterality: N/A;  . COMPLETE ESOPHAGECTOMY N/A 04/23/2016   Procedure: TRANSHIATIAL TOTAL ESOPHAGECTOMY COMPLETE; CERVICAL ESOPHAGOGASTROSTOMY AND PYLOROPLASTY;  Surgeon: [name], MD;  Location: [place];  Service: Thoracic;   Laterality: N/A;  . cystoscope  4/12/1   prostate  . ERCP    . ESOPHAGOGASTRODUODENOSCOPY (EGD) WITH PROPOFOL N/A 08/29/2016   Procedure: ESOPHAGOGASTRODUODENOSCOPY (EGD) WITH PROPOFOL;  Surgeon: Milus Banister, MD;  Location: [facility];  Service: Endoscopy;  Laterality: N/A;  . INGUINAL HERNIA REPAIR    . IR GENERIC HISTORICAL  08/31/2016   IR FLUORO GUIDE PORT INSERTION RIGHT 08/31/2016 Corrie Mckusick, DO [facility]  . IR GENERIC HISTORICAL  08/31/2016   IR US GUIDE VASC ACCESS RIGHT 08/31/2016 Corrie Mckusick, DO [facility]  . JEJUNOSTOMY N/A 04/23/2016   Procedure: FEEDING JEJUNOSTOMY;  Surgeon: [name], MD;  Location: [place];  Service: Thoracic;  Laterality: N/A;  . LEG SURGERY Left    hole  . PROSTATE BIOPSY  10/05/15  . right shoulder surgery    . VIDEO BRONCHOSCOPY N/A 04/23/2016   Procedure: VIDEO BRONCHOSCOPY;  Surgeon: [name], MD;  Location: [facility] OR;  Service: Thoracic;  Laterality: N/A;    SOCIAL HISTORY: Social History   Social History  . Marital status: Married    Spouse name: N/A  . Number of children: 1  . Years of education: N/A   Occupational History  . Truck Geophysicist/field seismologist    Social History Main Topics  . Smoking status: Former Smoker    Packs/day: 1.00    Years: 10.00    Quit date: 07/24/1967  . Smokeless tobacco: Never Used  . Alcohol use No  . Drug use: No  . Sexual activity: Not Currently   Other Topics Concern  . Not on file   Social History Narrative  . No narrative on file    FAMILY HISTORY: Family History  Problem Relation Age of Onset  . Cancer Maternal Grandfather 41       gastric cancer   . Alzheimer's disease Mother   . Diabetes Mother   . Stroke Father 69  . Multiple sclerosis Sister     ALLERGIES:  is allergic to no known allergies.  MEDICATIONS:  Current Outpatient Prescriptions  Medication Sig Dispense Refill  . acetaminophen (TYLENOL) 325 MG tablet Take 650 mg by mouth every 6 (six) hours as needed.    Marland Kitchen atenolol  (TENORMIN) 25 MG tablet Take 1 tab in the morning and half tab in the evening (Patient taking differently: Take 12.5-25 mg by mouth 2 (two) times daily. Take '25mg'$  in the morning and 12.'5mg'$  in the evening) 60 tablet 1  . folic acid (FOLVITE) 1 MG tablet Take 1 tablet (1 mg total) by mouth daily. 30 tablet 0  .  lidocaine-prilocaine (EMLA) cream Apply 1 application topically as needed. Apply 1-2 tsp over port site 1 hour prior to chemotherapy 30 g 1  . mirtazapine (REMERON) 15 MG tablet Take 1 tablet (15 mg total) by mouth at bedtime. 30 tablet 2  . Nutritional Supplements (NUTREN 1.5) LIQD Take 250 mLs by mouth at bedtime. 3 CANS VIA FEEDING TUBE    . ondansetron (ZOFRAN ODT) 8 MG disintegrating tablet Take 1 tablet (8 mg total) by mouth every 8 (eight) hours as needed for nausea or vomiting. 30 tablet 2  . prochlorperazine (COMPAZINE) 10 MG tablet Take 1 tablet (10 mg total) by mouth every 6 (six) hours as needed for nausea or vomiting. 30 tablet 3  . vitamin B-12 1000 MCG tablet Take 1 tablet (1,000 mcg total) by mouth daily. 30 tablet 0  . Water For Irrigation, Sterile (FREE WATER) SOLN Place 220 mLs into feeding tube 4 (four) times daily.    Marland Kitchen oxycodone (OXY-IR) 5 MG capsule Take 5 mg by mouth every 6 (six) hours as needed for pain.      No current facility-administered medications for this visit.     REVIEW OF SYSTEMS:   Constitutional: Denies fevers, chills or abnormal night sweats  Eyes: Denies blurriness of vision, double vision or watery eyes Ears, nose, mouth, throat, and face: Denies mucositis or sore throat Respiratory: Denies cough, dyspnea or wheezes Cardiovascular: Denies palpitation, chest discomfort or lower extremity swelling Gastrointestinal:  Denies heartburn or change in bowel habits (+) Drainage around J-tube. (+) some bleeding from feeding tube  Skin: Denies abnormal skin rashes Lymphatics: Denies new lymphadenopathy or easy bruising Neurological:Denies numbness, tingling  or new weaknesses Behavioral/Psych: Mood is stable, no new changes  Musculoskeletal: All other systems were reviewed with the patient and are negative.  PHYSICAL EXAMINATION:  ECOG PERFORMANCE STATUS: 2 Vitals:   02/20/17 1240  BP: 113/65  Pulse: 85  Resp: 18  Temp: 98.3 F (36.8 C)  TempSrc: Oral  SpO2: 100%  Weight: 134 lb 4.8 oz (60.9 kg)  Height: '5\' 5"'$  (1.651 m)    GENERAL:alert, no distress and comfortable, in wheelchair SKIN: Appears to be pale, skin texture, turgor are normal, no rashes or significant lesions EYES: normal, conjunctiva are pink and non-injected, sclera clear OROPHARYNX:no exudate, no erythema and lips, buccal mucosa, and tongue normal  NECK: supple, thyroid normal size, non-tender, without nodularity LYMPH:  no palpable lymphadenopathy in the cervical, axillary or inguinal LUNGS: clear to auscultation and percussion with normal breathing effort HEART: regular rate & rhythm and no murmurs and no lower extremity edema ABDOMEN:abdomen soft, non-tender and normal bowel sounds, J tube in place. No skin erythema or pus.(+) Feeding tube site with small bleeding around feeding tube site, no discharge. No skin erythema around J tube. Normal mucosa  Musculoskeletal:no cyanosis of digits and no clubbing  PSYCH: alert & oriented x 3 with fluent speech NEURO: no focal motor/sensory deficits  LABORATORY DATA:  I have reviewed the data as listed CBC Latest Ref Rng & Units 02/20/2017 02/06/2017 01/24/2017  WBC 4.0 - 10.3 10e3/uL 6.4 5.6 4.7  Hemoglobin 13.0 - 17.1 g/dL 11.9(L) 12.0(L) 11.9(L)  Hematocrit 38.4 - 49.9 % 34.9(L) 34.9(L) 35.0(L)  Platelets 140 - 400 10e3/uL 161 153 163   CMP Latest Ref Rng & Units 02/20/2017 02/06/2017 01/24/2017  Glucose 70 - 140 mg/dl 112 100 110  BUN 7.0 - 26.0 mg/dL 9.5 9.0 10.7  Creatinine 0.7 - 1.3 mg/dL 0.6(L) 0.6(L) 0.6(L)  Sodium 136 -  145 mEq/L 139 136 139  Potassium 3.5 - 5.1 mEq/L 4.0 4.2 4.2  Chloride 101 - 111 mmol/L - - -  CO2  22 - 29 mEq/L '25 23 24  '$ Calcium 8.4 - 10.4 mg/dL 9.2 9.3 9.3  Total Protein 6.4 - 8.3 g/dL 6.2(L) 6.3(L) 6.3(L)  Total Bilirubin 0.20 - 1.20 mg/dL 0.42 0.45 0.37  Alkaline Phos 40 - 150 U/L 148 158(H) 164(H)  AST 5 - 34 U/L '21 25 26  '$ ALT 0 - 55 U/L '11 14 13    '$ Results for YAMA, NIELSON (MRN [id number]) as of 02/20/2017 13:06  Ref. Range 12/12/2016 12:02 01/09/2017 09:10 02/06/2017 11:19  CEA ([facility]) Latest Ref Range: 0.00 - 5.00 ng/mL  2.88 3.11  Prostate Specific Ag, Serum Latest Ref Range: 0.0 - 4.0 ng/mL 3.0     PENDING 02/20/17    Pathology report [facility] TESTING 09/29/2016   PD-L1 Testing 09/29/2016   Diagnosis 09/27/2016 Consult- Comprehensive, [id number] - 1A Mass, Lower third of Esophagus, Mucosal Biopsy - ADENOCARCINOMA WITH EXTRACELLULAR MUCIN. - SEE COMMENT. Microscopic Comment Provided Her2 IHC with the appropriate controls is negative. Per report Her2 FISH is also negative. There is limited tissue remaining, but [facility] and PDL-1 testing will be attempted as requested.  Diagnosis 09/19/2016 PLEURAL FLUID, LEFT (SPECIMEN 1 OF 1 COLLECTED 09/19/16): REACTIVE APPEARING MESOTHELIAL CELLS PRESENT.  Diagnosis 08/31/2016 Bone Marrow Biopsy, right iliac - METASTATIC ADENOCARCINOMA. - SEE COMMENT. PERIPHERAL BLOOD: - NORMOCYTIC ANEMIA. - THROMBOCYTOPENIA.  Diagnosis 08/02/16 PLEURAL FLUID, LEFT (SPECIMEN 1 OF 1 COLLECTED 08/02/16): REACTIVE MESOTHELIAL CELLS PRESENT. LYMPHOCYTES PRESENT.  Diagnosis 11/23/2015 Mass, lower third of the esophagus, mucosal biopsy Mucinous adenocarcinoma, moderately differentiated.  Diagnosis 04/23/2016 1. Lymph node, biopsy, Cervical - ONE OF ONE LYMPH NODES NEGATIVE FOR CARCINOMA (0/1). 2. Lymph node, biopsy, Periesophageal - BLOOD WITH SCANT BENIGN SOFT TISSUE AND SKELETAL MUSCLE. - NO MALIGNANCY IDENTIFIED. 3. Esophagogastrectomy - ULCERATION WITH INFLAMMATION AND REACTIVE CHANGES. - FOCAL INTESTINAL METAPLASIA. -  CHANGES CONSISTENT WITH TREATMENT EFFECT. - SEVEN OF SEVEN LYMPH NODES NEGATIVE FOR CARCINOMA (0/7). - INCIDENTAL LEIOMYOMA, 0.2 CM. - MARGINS ARE NEGATIVE FOR DYSPLASIA OR MALIGNANCY. - SEE ONCOLOGY TABLE. Microscopic Comment 3. ESOPHAGUS: Specimen: Esophagus and proximal stomach, cervical lymph node. Procedure: Esophagogastrectomy and cervical lymph node biopsy. Tumor Site: Presumed GE junction, see comment. Relationship of Tumor to esophagogastric junction: Can not be assessed. Distance of tumor center from esophagogastric junction: Can not be assessed. Tumor Size Greatest dimension: No residual tumor present. Histologic Type: Per medical record adenocarcinoma (no biopsy for review). Histologic Grade: N/A. Microscopic Tumor Extension: N/A. Margins: Negative. Treatment Effect: Present (TRS 0). Lymph-Vascular Invasion: Not identified. Perineural Invasion: Not identified. Lymph nodes: number examined 8; number positive: 0 TNM: ypT0, ypN0 see comment. 1 of 3 FINAL for SHALIN, VONBARGEN ([id number]) Microscopic Comment(continued) Ancillary studies: None. Comments: The entire GE junction is submitted for evaluation. There is ulceration with associated inflammation. There is acellular mucin/myxoid changes which extend through the muscularis propria, but no viable/residual tumor is identified and thus the stage is ypT0. There is focal background intestinal metaplasia, which may have been pre-existing (Barrett's esophagus) or related to therapy.   RADIOGRAPHIC STUDIES: I have personally reviewed the radiological images as listed and agreed with the findings in the report. Nm Pet Image Restag (ps) Skull Base To Thigh  Result Date: 02/20/2017 CLINICAL DATA:  Subsequent treatment strategy for esophageal cancer. EXAM: NUCLEAR MEDICINE PET SKULL BASE TO THIGH TECHNIQUE: 6.6 mCi F-18 FDG was injected  intravenously. Full-ring PET imaging was performed from the skull base to thigh after the  radiotracer. CT data was obtained and used for attenuation correction and anatomic localization. FASTING BLOOD GLUCOSE:  Value: 123 mg/dl COMPARISON:  PET-CT 11/12/2016 and 08/14/2016. FINDINGS: NECK No hypermetabolic cervical lymph nodes are identified.There are no lesions of the pharyngeal mucosal space. CHEST There are no hypermetabolic mediastinal, hilar or axillary lymph nodes. Patient is status post esophagectomy and gastric pull-through. There is no abnormal associated metabolic activity. There is no suspicious pleural or pulmonary activity. Moderate size partially loculated left pleural effusion and small dependent right pleural effusion are unchanged. There is stable dependent left lower lobe atelectasis. No suspicious pulmonary nodule. ABDOMEN/PELVIS There is no hypermetabolic activity within the liver, right adrenal gland, spleen or pancreas. Mildly hypermetabolic left adrenal nodule is again noted. This has an SUV max of 4.9 (3.7 previously, and 6.2 on the study before that There is no hypermetabolic nodal activity. Focal hypermetabolic activity is again noted in the left prostate lobe anteriorly. This has an SUV max of 14.4 also similar to prior study. There is stable metabolic activity surrounding the insertion of the gastrojejunostomy. Bilateral renal cysts are noted. SKELETON There is no hypermetabolic activity to suggest osseous metastatic disease. Stable mild chronic compression deformity at L1. There are few scattered Schmorl's nodes in the thoracic spine. IMPRESSION: 1. No activity suspicious for metastatic adenocarcinoma status post esophagectomy and gastric pull-through. 2. Stable mild prominence of the left adrenal gland and associated hypermetabolic activity from several prior studies. The lack of change implies a benign etiology. No abnormal activity within the right adrenal gland. 3. Persistent focal activity peripherally in the left lobe of the prostate, unchanged from the most recent  study. Appearance is nonspecific, and could reflect prostate cancer or a benign nodule. Focal prostatitis less likely given persistence. 4. Stable loculated left pleural effusion and left basilar atelectasis. 5. These results were called by telephone at the time of interpretation on 02/20/2017 at 1:47 pm to Dr. Truitt Merle , who verbally acknowledged these results. Electronically Signed   By: Richardean Sale M.D.   On: 02/20/2017 13:50   PET 08/14/2016 IMPRESSION: 1. Small hypermetabolic left adrenal mass, this has not changed in size compared to 12/01/15 but has a maximum standard uptake value of 6.2 (previously 4.2). Indeterminate density and MRI characteristics, not specific for adenoma. Early metastatic disease is not readily excluded although the lack of change of size is somewhat reassuring. Surveillance recommended. 2. No other hypermetabolic lesions are identified. 3. Moderate to large left pleural effusion with passive atelectasis. 4. Esophagectomy with gastric pull-through. 5. No hypermetabolic liver lesions are seen. 6. Enlarged prostate gland.  CT angio chest/abdomen/pelvis for dissection w/wo contrast 08/22/2016 IMPRESSION: No evidence of aortic aneurysm or dissection. No evidence of pulmonary embolus. Heart is borderline in size. Stable small left adrenal nodule which was hypermetabolic on prior PET CT. Stable large left pleural effusion with compressive atelectasis in the left lower lobe. Stable appearance of the gastric pull-through post esophagectomy.  US Thoracentesis asp pleural space 09/19/2016 IMPRESSION: Successful ultrasound guided diagnostic and therapeutic left thoracentesis yielding 1.5 liters of pleural fluid.  PET 11/12/2016 IMPRESSION: 1. Heterogeneous bony uptake with several mildly avid foci primarily in the lower thoracic spine, lumbar spine, and pelvic bones consistent with known bony metastatic disease. 2. There is new uptake in the right adrenal gland not  seen previously with no CT correlate. Recommend attention on follow-up. 3. A rounded focus of uptake in  the anterior peripheral left prostate is larger in the interval. The findings are nonspecific and could be infectious, inflammatory, or neoplastic. Focal prostatitis or prostate cancer are most likely. 4. The uptake in the left adrenal gland has decreased in the interval. 5. Mild uptake in the bilateral hila may simply represent reactive nodes. Recommend attention on follow-up. 6. The left-sided pleural effusion is a little larger in the interval with loculated components.    ASSESSMENT & PLAN:  76 y.o. male presented with dysphagia with solid food  1. Distal esophageal adenocarcinoma, uT3N1M0, stage IIIA, ypT0N0M0, diffuse bone metastasis 08/2016, HER2 unknown -I previously reviewed his CT scan, PET scan, EGD, and the biopsy findings with patient and his family member in details. -The initial PET scan showed no definitive distant metastasis. 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 This will be followed in the future scan. -By EUS, he had T3 N1 stage III disease. -He completed neoadjuvant radiation with weekly Carbo and Taxol. -I previously reviewed his surgical pathology findings, he has had complete pathologic response to neoadjuvant chemotherapy and radiation, which predicts good prognosis -Unfortunately he developed diffuse bone metastasis in January 2018, confirmed by bone marrow biopsy -He has started first-line chemotherapy FOLFOX, tolerating well, and his anemia and thrombocytopenia has previously improved/resolved since chemotherapy -The goal of therapy is palliative -I have requested HER2 IHC test but was not sufficient tissue -his tumor was negative for PD-L1 expression and MSI-stable,  he is not a candidate for immunotherapy -I previously reviewed his [facility] One genomic testing results, unfortunately there  is no good targeted therapy available. His tumor has MSI stable -He has clinically been doing well overall, tolerating chemo very well  -CEA has decreased significant since starting FOLFOX in February 2018. - restaging PET Scan taken 11/12/16 were reviewed with pt and his family.  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 -We discussed the role of bone marrow biopsy to see if he has achieved complete response, I would only consider this if we plan to stop chemo. We discussed the incurable nature of his metastatic cancer, I'll hold bone marrow biopsy at this point. If he is clinically doing well, with stable CBC and no evidence of DIC, will continue 5-FU maintenance therapy. -Lab reviewed, adequate for treatment, will proceed with  5-Fu maintenance therapy every 2 weeks.   2. 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 thrombocytopenia has resolved. -The patient had many blood transfusions with the last on 09/28/16, he has not required any blood transfusion since he started chemotherapy. -Continue monitoring CBC  3. 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  -He has had 3 repeated thoracentesis, all cytology were negative, -Therapeutic thoracentesis performed on 09/19/16 yielded 1.5  liters of blood-tinged fluid. Reactive appearing mesothelial cells were present. -He still has small residual left pleural effusion, overall stable, -Continue monitoring,  previously repeated CXR -He was previously scheduled to follow-up with Dr. Pia Mau   4. HTN -Continue medication and follow-up of his primary care physician -his PCP at [facility] has changed his BP meds lately   5. Malnutrition -Doing better, he is tolerating J-tube feeding very well, he eats normally. Encouraged the patient to eat small frequent meals. -he has decreased his tube feeds lately  -Follow up with dietitian -Feeding tube site is clean and no bleeding lately   6. Anorexia and depression  -continue mirtazapine. Overall, previously improved lately  -He previously hasn't talked to the nutritionist in a while, he will follow up   7. Fatigue -He would like something to help his energy. -We previously discussed steroids to help for this, but I told them that this is not a long term solution -He would not like steroids at this time.  -We previously discussed fatigue being related to chemo or anemia.  -I previously encouraged him to eat well with protein and be more active   8. Goal of care discussion  -We previously discussed the incurable nature of his cancer, and the overall poor prognosis, especially if he does not have good response to chemotherapy or progress on chemo -The patient understands the goal of care is palliative. -I recommend DNR/DNI, he will think about it   9. 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 The patient knows to call the clinic with any problems, questions or concerns.  I spent 25 minutes counseling the patient face to face. The total time spent in the appointment was 30 minutes and more than 50% was on counseling.   Truitt Merle, MD 02/20/2017   This document serves as a record of services personally performed by Truitt Merle, MD. It was created on her behalf by Joslyn Devon, a trained medical scribe. The creation of this record is based on the scribe's personal observations and the provider's statements to them. This document has been checked and approved by the attending provider.

## 2017-02-20 NOTE — Telephone Encounter (Signed)
Gave patient avs report and appointments for August and September  °

## 2017-02-22 ENCOUNTER — Ambulatory Visit (HOSPITAL_BASED_OUTPATIENT_CLINIC_OR_DEPARTMENT_OTHER): Payer: No Typology Code available for payment source

## 2017-02-22 DIAGNOSIS — Z95828 Presence of other vascular implants and grafts: Secondary | ICD-10-CM

## 2017-02-22 DIAGNOSIS — C155 Malignant neoplasm of lower third of esophagus: Secondary | ICD-10-CM | POA: Diagnosis not present

## 2017-02-22 MED ORDER — HEPARIN SOD (PORK) LOCK FLUSH 100 UNIT/ML IV SOLN
500.0000 [IU] | Freq: Once | INTRAVENOUS | Status: DC
  Filled 2017-02-22: qty 5

## 2017-02-22 MED ORDER — SODIUM CHLORIDE 0.9% FLUSH
10.0000 mL | INTRAVENOUS | Status: DC | PRN
  Administered 2017-02-22: 10 mL via INTRAVENOUS
  Filled 2017-02-22: qty 10

## 2017-02-22 NOTE — Patient Instructions (Signed)

## 2017-03-04 NOTE — Progress Notes (Signed)
[place]  [phone] [phone]  Clinic Follow up Note   Patient Care Team: [name], MD as PCP - General (Internal Medicine) Truitt Merle, MD as Consulting Physician (Hematology) Kyung Rudd, MD as Consulting Physician (Radiation Oncology) [name], MD as Consulting Physician (Cardiothoracic Surgery) Karie Mainland, RD as Dietitian (Nutrition) Minus Breeding, MD as Consulting Physician (Cardiology)   CHIEF COMPLAINTS:  Follow up metastatic esophageal cancer    Oncology History   Presented with solid dysphagia at least daily with chest pain. Involuntary weight loss of 10-12 lb over past year  Malignant neoplasm of lower third of esophagus (HCC)   Staging form: Esophagus - Adenocarcinoma, AJCC 7th Edition   - Clinical stage from 12/15/2015: Stage IIIA (T3, N1, M0) - Signed by Truitt Merle, MD on 12/24/2015   - Pathologic stage from 04/26/2016: yT0, N0, cM0, GX - Signed by [name], MD on 04/27/2016      Malignant neoplasm of lower third of esophagus ([place])   11/23/2015 Procedure    EGD per [facility](Dr. Toledo):6cm mass in lower third of esophagus      11/24/2015 Initial Diagnosis    Esophageal cancer ([place])      11/24/2015 Pathology Results    Mucinous adenocarcinoma, moderately differentiated-Her2 analysis pending      12/05/2015 Imaging    PET scan showed hypermetabolic a mass in distal esophagus, hypermetabolic activity in the left anterior prostate gland, metabolic density in left adrenal nodule, CT attenuation suggest a benign adrenal adenoma. Left apical 2 mm lung nodule.      12/15/2015 Procedure    EUS showed a uT3N1 lesion from 27cm to 35cm from the incisors       12/26/2015 - 02/02/2016 Chemotherapy    Weekly carboplatin AUC 2 and Taxol 45 mg/m, with concurrent radiation      12/26/2015 - 02/02/2016 Radiation Therapy    Neoadjuvant chemoradiation to the esophageal cancer      04/23/2016  Surgery    total esophagectomy with cervical esophagogastrostomy, pyloroplasty, feeding jejunostomy       04/23/2016 Pathology Results    Esophagectomy showed no residual tumor, 8 lymph nodes were all negative, incidental leiomyoma 0.2 cm      08/14/2016 PET scan    IMPRESSION: 1. Small hypermetabolic left adrenal mass, this has not changed in size compared to 12/01/15 but has a maximum standard uptake value of 6.2 (previously 4.2). Indeterminate density and MRI characteristics, not specific for adenoma. Early metastatic disease is not readily excluded although the lack of change of size is somewhat reassuring. Surveillance recommended. 2. No other hypermetabolic lesions are identified. 3. Moderate to large left pleural effusion with passive atelectasis. 4. Esophagectomy with gastric pull-through. 5. No hypermetabolic liver lesions are seen. 6. Enlarged prostate gland.      08/16/2016 - 08/23/2016 Hospital Admission    He was diagnosed with DIC, unclear etiology. He was given blood products. He underwent echocardiogram which was negative for endocarditis, CTA chest/abd which was negative for aneurysm or dissection as work up for DIC. He was on prednisone for some time, but stopped as it was not helping much for DIC.       08/28/2016 - 09/01/2016 Hospital Admission    He has had issues with recurrent L sided pleural effusions, DIC and melena. He was last admitted in 1/25, given supportive care with a thoracentesis and blood transfusion. Dr Burr Medico has been following DIC and has requested a direct admit for melena,  anemia and thrombocytopenia. GI called for EGD.        08/31/2016 Pathology Results    Bone Marrow Biopsy, right iliac - METASTATIC ADENOCARCINOMA. - SEE COMMENT. PERIPHERAL BLOOD: - NORMOCYTIC ANEMIA. - THROMBOCYTOPENIA.      08/31/2016 Progression    Bone marrow biopsy showed metastatic adenocarcinoma      09/04/2016 -  Chemotherapy    FOLFOX every 2 weeks; changed to  maintenance 5-FU every 2 weeks starting 02/06/17      09/19/2016 Procedure    Therapeutic thoracentesis of a left pleural effusion performed on 09/19/16 yielded 1.5 liters of blood-tinged fluid. Reactive appearing mesothelial cells were present.      11/12/2016 PET scan     IMPRESSION: 1. Heterogeneous bony uptake with several mildly avid foci primarily in the lower thoracic spine, lumbar spine, and pelvic bones consistent with known bony metastatic disease. 2. There is new uptake in the right adrenal gland not seen previously with no CT correlate. Recommend attention on follow-up. 3. A rounded focus of uptake in the anterior peripheral left prostate is larger in the interval. The findings are nonspecific and could be infectious, inflammatory, or neoplastic. Focal prostatitis or prostate cancer are most likely. 4. The uptake in the left adrenal gland has decreased in the interval. 5. Mild uptake in the bilateral hila may simply represent reactive nodes. Recommend attention on follow-up. 6. The left-sided pleural effusion is a little larger in the interval with loculated components.      02/20/2017 PET scan    PET 02/20/17 IMPRESSION: 1. No activity suspicious for metastatic adenocarcinoma status post esophagectomy and gastric pull-through. 2. Stable mild prominence of the left adrenal gland and associated hypermetabolic activity from several prior studies. The lack of change implies a benign etiology. No abnormal activity within the right adrenal gland. 3. Persistent focal activity peripherally in the left lobe of the prostate, unchanged from the most recent study. Appearance is nonspecific, and could reflect prostate cancer or a benign nodule. Focal prostatitis less likely given persistence. 4. Stable loculated left pleural effusion and left basilar atelectasis. 5. These results were called by telephone at the time of interpretation on 02/20/2017 at 1:47 pm to Dr. Malachy Mood , who  verbally acknowledged these results.       HISTORY OF PRESENTING ILLNESS (12/14/2015):  Fred [name] 76 y.o. male is here because of His newly diagnosed esophageal cancer. He is accompanied by his wife and daughter to my clinic today.  He has had dysphagia for 2 months, mainly with solid food, no dysphagia with soft food or liquid. He also has mild pain in mid chest when he swallows. No nausea, abdominal pain, or other discomfort. He has lost about 15 lbs in the last year, no other symoptoms. He was seen by his primary care physician at [facility], and referred to gastroenterologist Dr. Norma Fredrickson. He underwent EGD on 11/23/2015, which showed a 6 cm mass in the lower third of esophagus. Biopsy showed adenocarcinoma, moderately differentiated. He was referred to [place] to consider neoadjuvant therapy. He is scheduled to see a Careers adviser at [facility] later this week.  He has had some urinary frequency, underwent TURP on 11/02/2015.   CURRENT THERAPY: first line chemo FOLFOX every 2 weeks started on 09/04/2016; changed to maintenance 5-FU every 2 weeks starting 02/06/17.  INTERIM HISTORY:  Fred [name] returns for follow up and treatment. He presents to the clinic today with his wife. He reports his chemo went fine last time. He  reports to not noticing a difference since change to just 5-FU. He reports he thinks his neuropathy has not gotten better. It's hard to button shirt but he has not dropped anything. When he swallows it feels like it may get stuck with solid food occasionally and sometimes with liquids. His wife says he is getting his medication from the [place]. He will see his PCP next week.     MEDICAL HISTORY:  Past Medical History:  Diagnosis Date  . Esophageal cancer ([place]) 11/23/15   lower 3rd esohagus   . GERD (gastroesophageal reflux disease)   . Hyperlipidemia   . Hypertension     SURGICAL HISTORY: Past Surgical History:  Procedure Laterality Date  . CHEST TUBE INSERTION Left 04/23/2016    Procedure: CHEST TUBE INSERTION;  Surgeon: [name], MD;  Location: [place];  Service: Thoracic;  Laterality: Left;  . COLONOSCOPY N/A 08/30/2016   Procedure: COLONOSCOPY;  Surgeon: Milus Banister, MD;  Location: [facility];  Service: Endoscopy;  Laterality: N/A;  . COMPLETE ESOPHAGECTOMY N/A 04/23/2016   Procedure: TRANSHIATIAL TOTAL ESOPHAGECTOMY COMPLETE; CERVICAL ESOPHAGOGASTROSTOMY AND PYLOROPLASTY;  Surgeon: [name], MD;  Location: [place];  Service: Thoracic;  Laterality: N/A;  . cystoscope  4/12/1   prostate  . ERCP    . ESOPHAGOGASTRODUODENOSCOPY (EGD) WITH PROPOFOL N/A 08/29/2016   Procedure: ESOPHAGOGASTRODUODENOSCOPY (EGD) WITH PROPOFOL;  Surgeon: Milus Banister, MD;  Location: [facility];  Service: Endoscopy;  Laterality: N/A;  . INGUINAL HERNIA REPAIR    . IR GENERIC HISTORICAL  08/31/2016   IR FLUORO GUIDE PORT INSERTION RIGHT 08/31/2016 Corrie Mckusick, DO [facility]-INTER[facility]  . IR GENERIC HISTORICAL  08/31/2016   IR US GUIDE VASC ACCESS RIGHT 08/31/2016 Corrie Mckusick, DO [facility]  . JEJUNOSTOMY N/A 04/23/2016   Procedure: FEEDING JEJUNOSTOMY;  Surgeon: [name], MD;  Location: [place];  Service: Thoracic;  Laterality: N/A;  . LEG SURGERY Left    hole  . PROSTATE BIOPSY  10/05/15  . right shoulder surgery    . VIDEO BRONCHOSCOPY N/A 04/23/2016   Procedure: VIDEO BRONCHOSCOPY;  Surgeon: [name], MD;  [facility];  Service: Thoracic;  Laterality: N/A;    SOCIAL HISTORY: Social History   Social History  . Marital status: Married    Spouse name: N/A  . Number of children: 1  . Years of education: N/A   Occupational History  . Truck Geophysicist/field seismologist    Social History Main Topics  . Smoking status: Former Smoker    Packs/day: 1.00    Years: 10.00    Quit date: 07/24/1967  . Smokeless tobacco: Never Used  . Alcohol use No  . Drug use: No  . Sexual activity: Not Currently   Other Topics Concern  . Not on file   Social History Narrative  . No  narrative on file    FAMILY HISTORY: Family History  Problem Relation Age of Onset  . Cancer Maternal Grandfather 11       gastric cancer   . Alzheimer's disease Mother   . Diabetes Mother   . Stroke Father 3  . Multiple sclerosis Sister     ALLERGIES:  is allergic to no known allergies.  MEDICATIONS:  Current Outpatient Prescriptions  Medication Sig Dispense Refill  . acetaminophen (TYLENOL) 325 MG tablet Take 650 mg by mouth every 6 (six) hours as needed.    Marland Kitchen atenolol (TENORMIN) 25 MG tablet Take 1 tab in the morning and half tab in  the evening (Patient taking differently: Take 12.5-25 mg by mouth 2 (two) times daily. Take '25mg'$  in the morning and 12.'5mg'$  in the evening) 60 tablet 1  . folic acid (FOLVITE) 1 MG tablet Take 1 tablet (1 mg total) by mouth daily. 30 tablet 0  . lidocaine-prilocaine (EMLA) cream Apply 1 application topically as needed. Apply 1-2 tsp over port site 1 hour prior to chemotherapy 30 g 1  . mirtazapine (REMERON) 15 MG tablet Take 1 tablet (15 mg total) by mouth at bedtime. 30 tablet 2  . Nutritional Supplements (NUTREN 1.5) LIQD 250 mLs by Jejunal Tube route at bedtime. 3 CANS VIA FEEDING TUBE    . ondansetron (ZOFRAN ODT) 8 MG disintegrating tablet Take 1 tablet (8 mg total) by mouth every 8 (eight) hours as needed for nausea or vomiting. 30 tablet 2  . oxycodone (OXY-IR) 5 MG capsule Take 5 mg by mouth every 6 (six) hours as needed for pain.     Marland Kitchen prochlorperazine (COMPAZINE) 10 MG tablet Take 1 tablet (10 mg total) by mouth every 6 (six) hours as needed for nausea or vomiting. 30 tablet 3  . vitamin B-12 1000 MCG tablet Take 1 tablet (1,000 mcg total) by mouth daily. 30 tablet 0  . Water For Irrigation, Sterile (FREE WATER) SOLN Place 220 mLs into feeding tube 4 (four) times daily. (Patient taking differently: Place 120 mLs into feeding tube as directed. Flush tube feeding with 120 ml of water pre and post tube feed each time.)     No current  facility-administered medications for this visit.     REVIEW OF SYSTEMS:   Constitutional: Denies fevers, chills or abnormal night sweats  Eyes: Denies blurriness of vision, double vision or watery eyes Ears, nose, mouth, throat, and face: Denies mucositis or sore throat (+) occasionally having trouble swallowing, Solids > Liquids  Respiratory: Denies cough, dyspnea or wheezes Cardiovascular: Denies palpitation, chest discomfort or lower extremity swelling Gastrointestinal:  Denies heartburn or change in bowel habits (+) Drainage around J-tube. (+) some bleeding from feeding tube  Skin: Denies abnormal skin rashes Lymphatics: Denies new lymphadenopathy or easy bruising Neurological:Denies numbness, tingling or new weaknesses Behavioral/Psych: Mood is stable, no new changes  Musculoskeletal: All other systems were reviewed with the patient and are negative.  PHYSICAL EXAMINATION:  ECOG PERFORMANCE STATUS: 2 Vitals:   03/06/17 1023  BP: (!) 110/56  Pulse: 77  Resp: 17  Temp: 98.2 F (36.8 C)  TempSrc: Oral  SpO2: 100%  Weight: 135 lb 12.8 oz (61.6 kg)  Height: '5\' 5"'$  (1.651 m)    GENERAL:alert, no distress and comfortable, in wheelchair SKIN: Appears to be pale, skin texture, turgor are normal, no rashes or significant lesions EYES: normal, conjunctiva are pink and non-injected, sclera clear OROPHARYNX:no exudate, no erythema and lips, buccal mucosa, and tongue normal  NECK: supple, thyroid normal size, non-tender, without nodularity LYMPH:  no palpable lymphadenopathy in the cervical, axillary or inguinal LUNGS: clear to auscultation and percussion with normal breathing effort HEART: regular rate & rhythm and no murmurs and no lower extremity edema ABDOMEN:abdomen soft, non-tender and normal bowel sounds, J tube in place. No skin erythema or pus.(+) Feeding tube site with small bleeding around feeding tube site, no discharge. No skin erythema around J tube. Normal mucosa    Musculoskeletal:no cyanosis of digits and no clubbing  PSYCH: alert & oriented x 3 with fluent speech NEURO: no focal motor/sensory deficits  LABORATORY DATA:  I have reviewed the data as  listed CBC Latest Ref Rng & Units 03/06/2017 02/20/2017 02/06/2017  WBC 4.0 - 10.3 10e3/uL 5.3 6.4 5.6  Hemoglobin 13.0 - 17.1 g/dL 12.0(L) 11.9(L) 12.0(L)  Hematocrit 38.4 - 49.9 % 34.9(L) 34.9(L) 34.9(L)  Platelets 140 - 400 10e3/uL 186 161 153   CMP Latest Ref Rng & Units 03/06/2017 02/20/2017 02/06/2017  Glucose 70 - 140 mg/dl 100 112 100  BUN 7.0 - 26.0 mg/dL 9.4 9.5 9.0  Creatinine 0.7 - 1.3 mg/dL 0.6(L) 0.6(L) 0.6(L)  Sodium 136 - 145 mEq/L 137 139 136  Potassium 3.5 - 5.1 mEq/L 4.2 4.0 4.2  Chloride 101 - 111 mmol/L - - -  CO2 22 - 29 mEq/L '25 25 23  '$ Calcium 8.4 - 10.4 mg/dL 9.4 9.2 9.3  Total Protein 6.4 - 8.3 g/dL 6.3(L) 6.2(L) 6.3(L)  Total Bilirubin 0.20 - 1.20 mg/dL 0.43 0.42 0.45  Alkaline Phos 40 - 150 U/L 142 148 158(H)  AST 5 - 34 U/L '22 21 25  '$ ALT 0 - 55 U/L '12 11 14     '$ Results for Fred [name], Fred [name] (MRN [id number]) as of 03/04/2017 13:01  Ref. Range 01/09/2017 09:10 02/06/2017 11:19 02/20/2017 12:10  [facility] ([facility]) Latest Ref Range: 0.00 - 5.00 ng/mL 2.88 3.11 2.89      Pathology report [facility] TESTING 09/29/2016   PD-L1 Testing 09/29/2016   Diagnosis 09/27/2016 Consult- Comprehensive, [id number] - 1A Mass, Lower third of Esophagus, Mucosal Biopsy - ADENOCARCINOMA WITH EXTRACELLULAR MUCIN. - SEE COMMENT. Microscopic Comment Provided Her2 IHC with the appropriate controls is negative. Per report Her2 FISH is also negative. There is limited tissue remaining, but [facility] and PDL-1 testing will be attempted as requested.  Diagnosis 09/19/2016 PLEURAL FLUID, LEFT (SPECIMEN 1 OF 1 COLLECTED 09/19/16): REACTIVE APPEARING MESOTHELIAL CELLS PRESENT.  Diagnosis 08/31/2016 Bone Marrow Biopsy, right iliac - METASTATIC ADENOCARCINOMA. - SEE COMMENT. PERIPHERAL BLOOD: -  NORMOCYTIC ANEMIA. - THROMBOCYTOPENIA.  Diagnosis 08/02/16 PLEURAL FLUID, LEFT (SPECIMEN 1 OF 1 COLLECTED 08/02/16): REACTIVE MESOTHELIAL CELLS PRESENT. LYMPHOCYTES PRESENT.  Diagnosis 11/23/2015 Mass, lower third of the esophagus, mucosal biopsy Mucinous adenocarcinoma, moderately differentiated.  Diagnosis 04/23/2016 1. Lymph node, biopsy, Cervical - ONE OF ONE LYMPH NODES NEGATIVE FOR CARCINOMA (0/1). 2. Lymph node, biopsy, Periesophageal - BLOOD WITH SCANT BENIGN SOFT TISSUE AND SKELETAL MUSCLE. - NO MALIGNANCY IDENTIFIED. 3. Esophagogastrectomy - ULCERATION WITH INFLAMMATION AND REACTIVE CHANGES. - FOCAL INTESTINAL METAPLASIA. - CHANGES CONSISTENT WITH TREATMENT EFFECT. - SEVEN OF SEVEN LYMPH NODES NEGATIVE FOR CARCINOMA (0/7). - INCIDENTAL LEIOMYOMA, 0.2 CM. - MARGINS ARE NEGATIVE FOR DYSPLASIA OR MALIGNANCY. - SEE ONCOLOGY TABLE. Microscopic Comment 3. ESOPHAGUS: Specimen: Esophagus and proximal stomach, cervical lymph node. Procedure: Esophagogastrectomy and cervical lymph node biopsy. Tumor Site: Presumed GE junction, see comment. Relationship of Tumor to esophagogastric junction: Can not be assessed. Distance of tumor center from esophagogastric junction: Can not be assessed. Tumor Size Greatest dimension: No residual tumor present. Histologic Type: Per medical record adenocarcinoma (no biopsy for review). Histologic Grade: N/A. Microscopic Tumor Extension: N/A. Margins: Negative. Treatment Effect: Present (TRS 0). Lymph-Vascular Invasion: Not identified. Perineural Invasion: Not identified. Lymph nodes: number examined 8; number positive: 0 TNM: ypT0, ypN0 see comment. 1 of 3 FINAL for Fred [name], Fred [name] ([id number]) Microscopic Comment(continued) Ancillary studies: None. Comments: The entire GE junction is submitted for evaluation. There is ulceration with associated inflammation. There is acellular mucin/myxoid changes which extend through the muscularis  propria, but no viable/residual tumor is identified and thus the stage is ypT0. There is focal  background intestinal metaplasia, which may have been pre-existing (Barrett's esophagus) or related to therapy.   RADIOGRAPHIC STUDIES: I have personally reviewed the radiological images as listed and agreed with the findings in the report. Nm Pet Image Restag (ps) Skull Base To Thigh  Result Date: 02/20/2017 CLINICAL DATA:  Subsequent treatment strategy for esophageal cancer. EXAM: NUCLEAR MEDICINE PET SKULL BASE TO THIGH TECHNIQUE: 6.6 mCi F-18 FDG was injected intravenously. Full-ring PET imaging was performed from the skull base to thigh after the radiotracer. CT data was obtained and used for attenuation correction and anatomic localization. FASTING BLOOD GLUCOSE:  Value: 123 mg/dl COMPARISON:  PET-CT 11/12/2016 and 08/14/2016. FINDINGS: NECK No hypermetabolic cervical lymph nodes are identified.There are no lesions of the pharyngeal mucosal space. CHEST There are no hypermetabolic mediastinal, hilar or axillary lymph nodes. Patient is status post esophagectomy and gastric pull-through. There is no abnormal associated metabolic activity. There is no suspicious pleural or pulmonary activity. Moderate size partially loculated left pleural effusion and small dependent right pleural effusion are unchanged. There is stable dependent left lower lobe atelectasis. No suspicious pulmonary nodule. ABDOMEN/PELVIS There is no hypermetabolic activity within the liver, right adrenal gland, spleen or pancreas. Mildly hypermetabolic left adrenal nodule is again noted. This has an SUV max of 4.9 (3.7 previously, and 6.2 on the study before that There is no hypermetabolic nodal activity. Focal hypermetabolic activity is again noted in the left prostate lobe anteriorly. This has an SUV max of 14.4 also similar to prior study. There is stable metabolic activity surrounding the insertion of the gastrojejunostomy. Bilateral renal  cysts are noted. SKELETON There is no hypermetabolic activity to suggest osseous metastatic disease. Stable mild chronic compression deformity at L1. There are few scattered Schmorl's nodes in the thoracic spine. IMPRESSION: 1. No activity suspicious for metastatic adenocarcinoma status post esophagectomy and gastric pull-through. 2. Stable mild prominence of the left adrenal gland and associated hypermetabolic activity from several prior studies. The lack of change implies a benign etiology. No abnormal activity within the right adrenal gland. 3. Persistent focal activity peripherally in the left lobe of the prostate, unchanged from the most recent study. Appearance is nonspecific, and could reflect prostate cancer or a benign nodule. Focal prostatitis less likely given persistence. 4. Stable loculated left pleural effusion and left basilar atelectasis. 5. These results were called by telephone at the time of interpretation on 02/20/2017 at 1:47 pm to Dr. Truitt Merle , who verbally acknowledged these results. Electronically Signed   By: Richardean Sale M.D.   On: 02/20/2017 13:50   PET 08/14/2016 IMPRESSION: 1. Small hypermetabolic left adrenal mass, this has not changed in size compared to 12/01/15 but has a maximum standard uptake value of 6.2 (previously 4.2). Indeterminate density and MRI characteristics, not specific for adenoma. Early metastatic disease is not readily excluded although the lack of change of size is somewhat reassuring. Surveillance recommended. 2. No other hypermetabolic lesions are identified. 3. Moderate to large left pleural effusion with passive atelectasis. 4. Esophagectomy with gastric pull-through. 5. No hypermetabolic liver lesions are seen. 6. Enlarged prostate gland.  CT angio chest/abdomen/pelvis for dissection w/wo contrast 08/22/2016 IMPRESSION: No evidence of aortic aneurysm or dissection. No evidence of pulmonary embolus. Heart is borderline in size. Stable small  left adrenal nodule which was hypermetabolic on prior PET CT. Stable large left pleural effusion with compressive atelectasis in the left lower lobe. Stable appearance of the gastric pull-through post esophagectomy.  US Thoracentesis asp pleural space 09/19/2016 IMPRESSION: Successful  ultrasound guided diagnostic and therapeutic left thoracentesis yielding 1.5 liters of pleural fluid.  PET 11/12/2016 IMPRESSION: 1. Heterogeneous bony uptake with several mildly avid foci primarily in the lower thoracic spine, lumbar spine, and pelvic bones consistent with known bony metastatic disease. 2. There is new uptake in the right adrenal gland not seen previously with no CT correlate. Recommend attention on follow-up. 3. A rounded focus of uptake in the anterior peripheral left prostate is larger in the interval. The findings are nonspecific and could be infectious, inflammatory, or neoplastic. Focal prostatitis or prostate cancer are most likely. 4. The uptake in the left adrenal gland has decreased in the interval. 5. Mild uptake in the bilateral hila may simply represent reactive nodes. Recommend attention on follow-up. 6. The left-sided pleural effusion is a little larger in the interval with loculated components.    ASSESSMENT & PLAN:  76 y.o. male presented with dysphagia with solid food  1. Distal esophageal adenocarcinoma, uT3N1M0, stage IIIA, ypT0N0M0, diffuse bone metastasis 08/2016, HER2 unknown -I previously reviewed his CT scan, PET scan, EGD, and the biopsy findings with patient and his family member in details. -The initial PET scan showed no definitive distant metastasis. 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 This will be followed in the future scan. -By EUS, he had T3 N1 stage III disease. -He completed neoadjuvant radiation with weekly Carbo and Taxol. -I previously reviewed his surgical pathology findings,  he has had complete pathologic response to neoadjuvant chemotherapy and radiation, which predicts good prognosis -Unfortunately he developed diffuse bone metastasis in January 2018, confirmed by bone marrow biopsy -He has started first-line chemotherapy FOLFOX, tolerating well, and his anemia and thrombocytopenia has previously improved/resolved since chemotherapy -The goal of therapy is palliative -I have requested HER2 IHC test but was not sufficient tissue -his tumor was negative for PD-L1 expression and MSI-stable,  he is not a candidate for immunotherapy -I previously reviewed his [facility] genomic testing results, unfortunately there is no good targeted therapy available. His tumor has MSI stable -He has clinically been doing well overall, tolerating chemo very well  -CEA has decreased significant since starting FOLFOX in February 2018. - restaging PET Scan taken 11/12/16 were reviewed with pt and his family.  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 -We discussed the role of bone marrow biopsy to see if he has achieved complete response, I would only consider this if we plan to stop chemo. We discussed the incurable nature of his metastatic cancer, I'll hold bone marrow biopsy at this point. If he is clinically doing well, with stable CBC and no evidence of DIC, will continue 5-FU maintenance  therapy -Lab reviewed, adequate for treatment, will proceed with  5-Fu maintenance therapy every 2 weeks.  -I will f/u with him in 4 weeks   2. 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 thrombocytopenia has resolved. -The patient had many blood transfusions with the last on 09/28/16, he has not required any blood transfusion since he started chemotherapy. -Continue monitoring CBC  3. 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  -He has had 3 repeated thoracentesis, all cytology were negative, -Therapeutic thoracentesis performed on 09/19/16 yielded 1.5 liters of blood-tinged fluid. Reactive appearing mesothelial cells were present. -He still has small residual left pleural effusion, overall stable, -Continue monitoring, previously repeated CXR -He was previously scheduled to follow-up with Dr. Pia Mau   4. HTN -Continue medication and follow-up of his primary care physician -his PCP at [facility] has changed his BP meds lately   5. Malnutrition -Doing better, he is tolerating J-tube feeding very well, he eats normally. Encouraged the patient to eat small frequent meals. -he has decreased his tube feeds lately  -Follow up with dietitian -Feeding tube site is clean and no bleeding lately   6. Anorexia and depression  -continue mirtazapine. Overall, previously improved lately  -He previously hasn't talked to the nutritionist in a while, he will follow up   7. Fatigue -He would like something to help his energy. -We previously discussed steroids to help for this, but I told them that this is not a long term solution -He would not like steroids at this time.  -We previously discussed fatigue being related to chemo or anemia.  -I previously encouraged him to eat well with protein and be more active   8. Goal of care discussion  -We previously discussed the incurable nature  of his cancer, and the overall poor prognosis, especially if he does not have good response to chemotherapy or progress on chemo -The patient understands the goal of care is palliative. -I recommend DNR/DNI, he will think about it   9. 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   Plan -Lab reviewed, adequate for treatment, we'll proceed maintenance 5-FU today and continue every 2 weeks -I'll see him back in 4 weeks   All questions were answered. The patient knows to call the clinic with any problems, questions or concerns.  I spent 15 minutes counseling the patient face to face. The total time spent in the appointment was 20 minutes and more than 50% was on counseling.   Truitt Merle, MD 03/06/2017   This document serves as a record of services personally performed by Truitt Merle, MD. It was created on her behalf by Joslyn Devon, a trained medical scribe. The creation of this record is based on the scribe's personal observations and the provider's statements to them. This document has been checked and approved by the attending provider.

## 2017-03-06 ENCOUNTER — Ambulatory Visit (HOSPITAL_BASED_OUTPATIENT_CLINIC_OR_DEPARTMENT_OTHER): Payer: No Typology Code available for payment source | Admitting: Hematology

## 2017-03-06 ENCOUNTER — Ambulatory Visit: Payer: No Typology Code available for payment source

## 2017-03-06 ENCOUNTER — Encounter: Payer: Self-pay | Admitting: Hematology

## 2017-03-06 ENCOUNTER — Ambulatory Visit: Payer: No Typology Code available for payment source | Admitting: Nutrition

## 2017-03-06 ENCOUNTER — Telehealth: Payer: Self-pay | Admitting: Hematology

## 2017-03-06 ENCOUNTER — Other Ambulatory Visit (HOSPITAL_BASED_OUTPATIENT_CLINIC_OR_DEPARTMENT_OTHER): Payer: No Typology Code available for payment source

## 2017-03-06 ENCOUNTER — Ambulatory Visit (HOSPITAL_BASED_OUTPATIENT_CLINIC_OR_DEPARTMENT_OTHER): Payer: Non-veteran care

## 2017-03-06 VITALS — BP 110/56 | HR 77 | Temp 98.2°F | Resp 17 | Ht 65.0 in | Wt 135.8 lb

## 2017-03-06 DIAGNOSIS — I10 Essential (primary) hypertension: Secondary | ICD-10-CM

## 2017-03-06 DIAGNOSIS — C7951 Secondary malignant neoplasm of bone: Secondary | ICD-10-CM

## 2017-03-06 DIAGNOSIS — K921 Melena: Secondary | ICD-10-CM

## 2017-03-06 DIAGNOSIS — J90 Pleural effusion, not elsewhere classified: Secondary | ICD-10-CM

## 2017-03-06 DIAGNOSIS — C155 Malignant neoplasm of lower third of esophagus: Secondary | ICD-10-CM | POA: Diagnosis not present

## 2017-03-06 DIAGNOSIS — R634 Abnormal weight loss: Secondary | ICD-10-CM

## 2017-03-06 DIAGNOSIS — Z5111 Encounter for antineoplastic chemotherapy: Secondary | ICD-10-CM

## 2017-03-06 DIAGNOSIS — E440 Moderate protein-calorie malnutrition: Secondary | ICD-10-CM | POA: Diagnosis not present

## 2017-03-06 LAB — CBC & DIFF AND RETIC
BASO%: 0.4 % (ref 0.0–2.0)
Basophils Absolute: 0 10*3/uL (ref 0.0–0.1)
EOS ABS: 0.1 10*3/uL (ref 0.0–0.5)
EOS%: 2.3 % (ref 0.0–7.0)
HEMATOCRIT: 34.9 % — AB (ref 38.4–49.9)
HEMOGLOBIN: 12 g/dL — AB (ref 13.0–17.1)
Immature Retic Fract: 6.9 % (ref 3.00–10.60)
LYMPH%: 16.2 % (ref 14.0–49.0)
MCH: 33.2 pg (ref 27.2–33.4)
MCHC: 34.4 g/dL (ref 32.0–36.0)
MCV: 96.7 fL (ref 79.3–98.0)
MONO#: 0.8 10*3/uL (ref 0.1–0.9)
MONO%: 14.9 % — AB (ref 0.0–14.0)
NEUT%: 66.2 % (ref 39.0–75.0)
NEUTROS ABS: 3.5 10*3/uL (ref 1.5–6.5)
Platelets: 186 10*3/uL (ref 140–400)
RBC: 3.61 10*6/uL — ABNORMAL LOW (ref 4.20–5.82)
RDW: 14.7 % — ABNORMAL HIGH (ref 11.0–14.6)
RETIC %: 2.36 % — AB (ref 0.80–1.80)
Retic Ct Abs: 85.2 10*3/uL (ref 34.80–93.90)
WBC: 5.3 10*3/uL (ref 4.0–10.3)
lymph#: 0.9 10*3/uL (ref 0.9–3.3)

## 2017-03-06 LAB — COMPREHENSIVE METABOLIC PANEL
ALBUMIN: 3.3 g/dL — AB (ref 3.5–5.0)
ALT: 12 U/L (ref 0–55)
ANION GAP: 9 meq/L (ref 3–11)
AST: 22 U/L (ref 5–34)
Alkaline Phosphatase: 142 U/L (ref 40–150)
BILIRUBIN TOTAL: 0.43 mg/dL (ref 0.20–1.20)
BUN: 9.4 mg/dL (ref 7.0–26.0)
CALCIUM: 9.4 mg/dL (ref 8.4–10.4)
CHLORIDE: 104 meq/L (ref 98–109)
CO2: 25 mEq/L (ref 22–29)
CREATININE: 0.6 mg/dL — AB (ref 0.7–1.3)
EGFR: >90 mL/min/{1.73_m2} (ref >90)
Glucose: 100 mg/dl (ref 70–140)
Potassium: 4.2 mEq/L (ref 3.5–5.1)
Sodium: 137 mEq/L (ref 136–145)
TOTAL PROTEIN: 6.3 g/dL — AB (ref 6.4–8.3)

## 2017-03-06 MED ORDER — PROCHLORPERAZINE MALEATE 10 MG PO TABS
ORAL_TABLET | ORAL | Status: AC
  Filled 2017-03-06: qty 1

## 2017-03-06 MED ORDER — PROCHLORPERAZINE MALEATE 10 MG PO TABS
10.0000 mg | ORAL_TABLET | Freq: Once | ORAL | Status: AC
  Administered 2017-03-06: 10 mg via ORAL

## 2017-03-06 MED ORDER — SODIUM CHLORIDE 0.9 % IV SOLN
2400.0000 mg/m2 | INTRAVENOUS | Status: DC
  Administered 2017-03-06: 3950 mg via INTRAVENOUS
  Filled 2017-03-06: qty 79

## 2017-03-06 NOTE — Progress Notes (Signed)
Nutrition follow-up completed with patient and his wife, during treatment for esophageal cancer. Patient reports good tolerance of Nutren 1.5 via jejunostomy tube daily.  Patient typically tolerates 3 cans daily with oral intake during the day. Patient reports he occasionally skips nocturnal feeding when he goes out of town Patient complains of taste alterations. Weight decreased and documented as 134.3 pounds decreased from 138.3 pounds June 6.  Nutrition diagnosis: Unintentional weight loss continues.  Intervention: Educated patient to continue Nutren 1.5 via J-tube and increase oral intake throughout the day. Encouraged patient to "make up" missed tube feeding when possible. Discouraged further weight loss.  Monitoring, evaluation, goals: Patient will tolerate oral diet along with jejunostomy tube feedings to promote weight maintenance.  Next visit: To be scheduled as needed.  **Disclaimer: This note was dictated with voice recognition software. Similar sounding words can inadvertently be transcribed and this note may contain transcription errors which may not have been corrected upon publication of note.**

## 2017-03-06 NOTE — Telephone Encounter (Signed)
Scheduled appt per 8/15 los - left message with appt date and time -

## 2017-03-06 NOTE — Patient Instructions (Signed)

## 2017-03-06 NOTE — Patient Instructions (Signed)
[facility] Discharge Instructions for Patients Receiving Chemotherapy  Today you received the following chemotherapy agents: Adrucil   To help prevent nausea and vomiting after your treatment, we encourage you to take your nausea medication as directed.    If you develop nausea and vomiting that is not controlled by your nausea medication, call the clinic.   BELOW ARE SYMPTOMS THAT SHOULD BE REPORTED IMMEDIATELY:  *FEVER GREATER THAN 100.5 F  *CHILLS WITH OR WITHOUT FEVER  NAUSEA AND VOMITING THAT IS NOT CONTROLLED WITH YOUR NAUSEA MEDICATION  *UNUSUAL SHORTNESS OF BREATH  *UNUSUAL BRUISING OR BLEEDING  TENDERNESS IN MOUTH AND THROAT WITH OR WITHOUT PRESENCE OF ULCERS  *URINARY PROBLEMS  *BOWEL PROBLEMS  UNUSUAL RASH Items with * indicate a potential emergency and should be followed up as soon as possible.  Feel free to call the clinic you have any questions or concerns. The clinic phone number is [phone].  Please show the CHEMO ALERT CARD at check-in to the Emergency Department and triage nurse.   

## 2017-03-08 ENCOUNTER — Ambulatory Visit (HOSPITAL_BASED_OUTPATIENT_CLINIC_OR_DEPARTMENT_OTHER): Payer: No Typology Code available for payment source

## 2017-03-08 VITALS — BP 118/64 | HR 68 | Temp 98.4°F | Resp 18

## 2017-03-08 DIAGNOSIS — C155 Malignant neoplasm of lower third of esophagus: Secondary | ICD-10-CM | POA: Diagnosis not present

## 2017-03-08 MED ORDER — SODIUM CHLORIDE 0.9% FLUSH
10.0000 mL | INTRAVENOUS | Status: DC | PRN
  Administered 2017-03-08: 10 mL
  Filled 2017-03-08: qty 10

## 2017-03-08 MED ORDER — HEPARIN SOD (PORK) LOCK FLUSH 100 UNIT/ML IV SOLN
500.0000 [IU] | Freq: Once | INTRAVENOUS | Status: AC | PRN
  Administered 2017-03-08: 500 [IU]
  Filled 2017-03-08: qty 5

## 2017-03-12 ENCOUNTER — Ambulatory Visit: Payer: No Typology Code available for payment source | Admitting: Cardiothoracic Surgery

## 2017-03-13 ENCOUNTER — Ambulatory Visit: Payer: No Typology Code available for payment source | Admitting: Cardiothoracic Surgery

## 2017-03-14 ENCOUNTER — Ambulatory Visit: Payer: No Typology Code available for payment source | Admitting: Cardiothoracic Surgery

## 2017-03-20 ENCOUNTER — Other Ambulatory Visit (HOSPITAL_BASED_OUTPATIENT_CLINIC_OR_DEPARTMENT_OTHER): Payer: Non-veteran care

## 2017-03-20 ENCOUNTER — Ambulatory Visit (HOSPITAL_BASED_OUTPATIENT_CLINIC_OR_DEPARTMENT_OTHER): Payer: Non-veteran care

## 2017-03-20 ENCOUNTER — Other Ambulatory Visit: Payer: No Typology Code available for payment source

## 2017-03-20 ENCOUNTER — Ambulatory Visit: Payer: No Typology Code available for payment source | Admitting: Hematology

## 2017-03-20 VITALS — HR 78 | Temp 98.6°F | Resp 18 | Wt 134.8 lb

## 2017-03-20 DIAGNOSIS — Z5111 Encounter for antineoplastic chemotherapy: Secondary | ICD-10-CM

## 2017-03-20 DIAGNOSIS — C155 Malignant neoplasm of lower third of esophagus: Secondary | ICD-10-CM

## 2017-03-20 DIAGNOSIS — C7951 Secondary malignant neoplasm of bone: Secondary | ICD-10-CM | POA: Diagnosis not present

## 2017-03-20 LAB — COMPREHENSIVE METABOLIC PANEL
ALBUMIN: 3.3 g/dL — AB (ref 3.5–5.0)
ALK PHOS: 122 U/L (ref 40–150)
ALT: 13 U/L (ref 0–55)
AST: 22 U/L (ref 5–34)
Anion Gap: 7 mEq/L (ref 3–11)
BUN: 7.2 mg/dL (ref 7.0–26.0)
CALCIUM: 9.3 mg/dL (ref 8.4–10.4)
CO2: 25 mEq/L (ref 22–29)
CREATININE: 0.7 mg/dL (ref 0.7–1.3)
Chloride: 107 mEq/L (ref 98–109)
EGFR: >90 mL/min/{1.73_m2} (ref >90)
GLUCOSE: 104 mg/dL (ref 70–140)
POTASSIUM: 3.9 meq/L (ref 3.5–5.1)
SODIUM: 139 meq/L (ref 136–145)
TOTAL PROTEIN: 6.5 g/dL (ref 6.4–8.3)
Total Bilirubin: 0.54 mg/dL (ref 0.20–1.20)

## 2017-03-20 LAB — CBC & DIFF AND RETIC
BASO%: 0.4 % (ref 0.0–2.0)
Basophils Absolute: 0 10*3/uL (ref 0.0–0.1)
EOS ABS: 0.1 10*3/uL (ref 0.0–0.5)
EOS%: 2 % (ref 0.0–7.0)
HCT: 36.9 % — ABNORMAL LOW (ref 38.4–49.9)
HGB: 12.6 g/dL — ABNORMAL LOW (ref 13.0–17.1)
Immature Retic Fract: 3.3 % (ref 3.00–10.60)
LYMPH%: 17.6 % (ref 14.0–49.0)
MCH: 33.1 pg (ref 27.2–33.4)
MCHC: 34.1 g/dL (ref 32.0–36.0)
MCV: 96.9 fL (ref 79.3–98.0)
MONO#: 0.7 10*3/uL (ref 0.1–0.9)
MONO%: 14.2 % — AB (ref 0.0–14.0)
NEUT%: 65.8 % (ref 39.0–75.0)
NEUTROS ABS: 3.3 10*3/uL (ref 1.5–6.5)
Platelets: 181 10*3/uL (ref 140–400)
RBC: 3.81 10*6/uL — AB (ref 4.20–5.82)
RDW: 14.5 % (ref 11.0–14.6)
RETIC %: 1.36 % (ref 0.80–1.80)
Retic Ct Abs: 51.82 10*3/uL (ref 34.80–93.90)
WBC: 5 10*3/uL (ref 4.0–10.3)
lymph#: 0.9 10*3/uL (ref 0.9–3.3)

## 2017-03-20 LAB — CEA (IN HOUSE-CHCC): CEA (CHCC-IN HOUSE): 3.37 ng/mL (ref 0.00–5.00)

## 2017-03-20 MED ORDER — PROCHLORPERAZINE MALEATE 10 MG PO TABS
10.0000 mg | ORAL_TABLET | Freq: Once | ORAL | Status: AC
  Administered 2017-03-20: 10 mg via ORAL

## 2017-03-20 MED ORDER — SODIUM CHLORIDE 0.9 % IV SOLN
2400.0000 mg/m2 | INTRAVENOUS | Status: DC
  Administered 2017-03-20: 3950 mg via INTRAVENOUS
  Filled 2017-03-20: qty 79

## 2017-03-20 MED ORDER — PROCHLORPERAZINE MALEATE 10 MG PO TABS
ORAL_TABLET | ORAL | Status: AC
  Filled 2017-03-20: qty 1

## 2017-03-20 NOTE — Patient Instructions (Signed)
[facility] Discharge Instructions for Patients Receiving Chemotherapy  Today you received the following chemotherapy agents: Adrucil   To help prevent nausea and vomiting after your treatment, we encourage you to take your nausea medication as directed.    If you develop nausea and vomiting that is not controlled by your nausea medication, call the clinic.   BELOW ARE SYMPTOMS THAT SHOULD BE REPORTED IMMEDIATELY:  *FEVER GREATER THAN 100.5 F  *CHILLS WITH OR WITHOUT FEVER  NAUSEA AND VOMITING THAT IS NOT CONTROLLED WITH YOUR NAUSEA MEDICATION  *UNUSUAL SHORTNESS OF BREATH  *UNUSUAL BRUISING OR BLEEDING  TENDERNESS IN MOUTH AND THROAT WITH OR WITHOUT PRESENCE OF ULCERS  *URINARY PROBLEMS  *BOWEL PROBLEMS  UNUSUAL RASH Items with * indicate a potential emergency and should be followed up as soon as possible.  Feel free to call the clinic you have any questions or concerns. The clinic phone number is [phone].  Please show the CHEMO ALERT CARD at check-in to the Emergency Department and triage nurse.   

## 2017-03-22 ENCOUNTER — Ambulatory Visit (HOSPITAL_BASED_OUTPATIENT_CLINIC_OR_DEPARTMENT_OTHER): Payer: No Typology Code available for payment source

## 2017-03-22 VITALS — BP 118/61 | HR 73 | Temp 98.3°F | Resp 17

## 2017-03-22 DIAGNOSIS — C155 Malignant neoplasm of lower third of esophagus: Secondary | ICD-10-CM

## 2017-03-22 DIAGNOSIS — Z452 Encounter for adjustment and management of vascular access device: Secondary | ICD-10-CM | POA: Diagnosis not present

## 2017-03-22 DIAGNOSIS — C7951 Secondary malignant neoplasm of bone: Secondary | ICD-10-CM

## 2017-03-22 MED ORDER — HEPARIN SOD (PORK) LOCK FLUSH 100 UNIT/ML IV SOLN
500.0000 [IU] | Freq: Once | INTRAVENOUS | Status: AC | PRN
  Administered 2017-03-22: 500 [IU]
  Filled 2017-03-22: qty 5

## 2017-03-22 MED ORDER — SODIUM CHLORIDE 0.9% FLUSH
10.0000 mL | INTRAVENOUS | Status: DC | PRN
  Administered 2017-03-22: 10 mL
  Filled 2017-03-22: qty 10

## 2017-03-22 NOTE — Patient Instructions (Signed)

## 2017-03-27 ENCOUNTER — Other Ambulatory Visit: Payer: Self-pay | Admitting: Cardiothoracic Surgery

## 2017-03-27 DIAGNOSIS — C159 Malignant neoplasm of esophagus, unspecified: Secondary | ICD-10-CM

## 2017-03-28 ENCOUNTER — Ambulatory Visit
Admission: RE | Admit: 2017-03-28 | Discharge: 2017-03-28 | Disposition: A | Discharge status: Home / Self Care | Payer: PRIVATE HEALTH INSURANCE | Source: Ambulatory Visit | Attending: Cardiothoracic Surgery | Admitting: Cardiothoracic Surgery

## 2017-03-28 ENCOUNTER — Other Ambulatory Visit: Payer: Self-pay | Admitting: *Deleted

## 2017-03-28 ENCOUNTER — Ambulatory Visit (INDEPENDENT_AMBULATORY_CARE_PROVIDER_SITE_OTHER): Payer: Non-veteran care | Admitting: Cardiothoracic Surgery

## 2017-03-28 ENCOUNTER — Encounter: Payer: Self-pay | Admitting: Cardiothoracic Surgery

## 2017-03-28 VITALS — BP 104/61 | HR 70 | Resp 16 | Ht 65.0 in | Wt 132.2 lb

## 2017-03-28 DIAGNOSIS — J90 Pleural effusion, not elsewhere classified: Secondary | ICD-10-CM | POA: Diagnosis not present

## 2017-03-28 DIAGNOSIS — C159 Malignant neoplasm of esophagus, unspecified: Secondary | ICD-10-CM | POA: Diagnosis not present

## 2017-03-28 DIAGNOSIS — Z09 Encounter for follow-up examination after completed treatment for conditions other than malignant neoplasm: Secondary | ICD-10-CM | POA: Diagnosis not present

## 2017-03-28 DIAGNOSIS — C155 Malignant neoplasm of lower third of esophagus: Secondary | ICD-10-CM

## 2017-03-28 DIAGNOSIS — K222 Esophageal obstruction: Secondary | ICD-10-CM

## 2017-03-28 NOTE — Progress Notes (Signed)
[address]       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 SURGEON:  Lanelle Bal, M.D.  Cancer Staging Malignant neoplasm of lower third of esophagus [facility]) Staging form: Esophagus - Adenocarcinoma, AJCC 7th Edition - Clinical stage from 12/15/2015: Stage IIIA (T3, N1, M0) - Signed by Truitt Merle, MD on 12/24/2015 - Pathologic stage from 04/26/2016: yT0, N0, cM0, GX - Signed by [name], MD on 04/27/2016    History of Present Illness:     Patient returns to the office today in follow-up after esophageal resection. In February he had developed significant anemia requiring transfusions upper GI endoscopy and colonoscopy did not reveal any source of bleeding or recurrent tumor, bone marrow biopsy revealed metastatic adenocarcinoma. He's been started on chemotherapy and has had bone marrow function improved after the first cycle of chemotherapy. He has had date cut back on his chemotherapy regiment due to neuropathy. Recent PET scan did not show any evidence of recurrent disease, he is to have a follow-up bone marrow in the near future.   He has noted some difficulty in swallowing and a sensation of food sticking in his cervical esophagus. He's been taking only a by mouth diet, but over the past several  weeks has lost a couple pounds.   Diagnosis Bone Marrow Biopsy, right iliac - METASTATIC ADENOCARCINOMA. - SEE COMMENT. PERIPHERAL BLOOD: - NORMOCYTIC ANEMIA. - THROMBOCYTOPENIA.  PLEURAL FLUID, LEFT (SPECIMEN 1 OF 1 COLLECTED 07/04/16): REACTIVE APPEARING MESOTHELIAL CELLS. Enid Cutter MD Pathologist, Electronic Signature     Past Medical History:  Diagnosis Date  . Esophageal cancer ([place]) 11/23/15   lower 3rd esohagus   . GERD (gastroesophageal reflux disease)   . Hyperlipidemia   . Hypertension      History  Smoking Status  . Former Smoker  . Packs/day: 1.00  . Years: 10.00  . Quit date: 07/24/1967  Smokeless Tobacco  . Never Used    History  Alcohol Use No     Allergies  Allergen Reactions  . No Known Allergies     Current Outpatient Prescriptions  Medication Sig Dispense Refill  . acetaminophen (TYLENOL) 325 MG tablet Take 650 mg by mouth every 6 (six) hours as needed.    Marland Kitchen atenolol (TENORMIN) 25 MG tablet Take 1 tab in the morning and half tab in the evening (Patient taking differently: Take 12.5-25 mg by mouth 2 (two) times daily. Take 27m in the morning and 12.553min the evening) 60 tablet 1  . folic acid (FOLVITE) 1 MG tablet Take 1 tablet (1 mg total) by mouth daily. 30 tablet 0  . lidocaine-prilocaine (EMLA) cream Apply 1 application topically as needed. Apply 1-2 tsp over port site 1 hour prior to chemotherapy 30 g 1  . mirtazapine (REMERON) 15 MG tablet Take 1 tablet (15 mg total) by mouth at bedtime. 30 tablet 2  .  Nutritional Supplements (NUTREN 1.5) LIQD 250 mLs by Jejunal Tube route at bedtime. 3 CANS VIA FEEDING TUBE    . ondansetron (ZOFRAN ODT) 8 MG disintegrating tablet Take 1 tablet (8 mg total) by mouth every 8 (eight) hours as needed for nausea or vomiting. 30 tablet 2  . oxycodone (OXY-IR) 5 MG capsule Take 5 mg by mouth every 6 (six) hours as needed for pain.     Marland Kitchen prochlorperazine (COMPAZINE) 10 MG tablet Take 1 tablet (10 mg total) by  mouth every 6 (six) hours as needed for nausea or vomiting. 30 tablet 3  . vitamin B-12 1000 MCG tablet Take 1 tablet (1,000 mcg total) by mouth daily. 30 tablet 0   No current facility-administered medications for this visit.        Physical Exam: BP 104/61 (BP Location: Right Arm, Patient Position: Sitting, Cuff Size: Normal)   Pulse 70   Resp 16   Ht 5' 5"  (1.651 m)   Wt 132 lb 3.2 oz (60 kg)   SpO2 98% Comment: ON RA  BMI 22.00 kg/m   Wt Readings from Last 3 Encounters:  03/28/17 132 lb 3.2 oz (60 kg)  03/20/17 134 lb 12 oz (61.1 kg)  03/06/17 135 lb 12.8 oz (61.6 kg)   Physical Exam  Constitutional: He is oriented to person, place, and time. No distress.  HENT:  Mouth/Throat: No oropharyngeal exudate.  Eyes: Right eye exhibits no discharge. Left eye exhibits no discharge. No scleral icterus.  Neck: No JVD present. No tracheal deviation present. No thyromegaly present.  Cardiovascular: Exam reveals no gallop and no friction rub.   No murmur heard. Respiratory: No stridor. No respiratory distress. He has no wheezes. He has no rales. He exhibits no tenderness.  GI: He exhibits no distension and no mass. There is no tenderness. There is no rebound and no guarding.  Lymphadenopathy:    He has no cervical adenopathy.  Neurological: He is alert and oriented to person, place, and time.  Skin: Skin is warm and dry. He is not diaphoretic.  Psychiatric: He has a normal mood and affect. His behavior is normal. Judgment normal.  Patient has granulation tissue around the jejunostomy tube site, silver nitrate was placed on this in the office today The patient has no cervical or supraclavicular adenopathy Slight decreased breath sounds in the left base Diagnostic Studies & Laboratory data:     Recent Radiology Findings:   Dg Chest 2 View  Result Date: 03/28/2017 CLINICAL DATA:  Esophageal cancer. EXAM: CHEST  2 VIEW COMPARISON:  02/19/2017 PET-CT FINDINGS: Moderate size left  pleural effusion with passive atelectasis. Power injectable right Port-A-Cath tip: SVC. Trace right pleural effusion with blunting of the right costophrenic angle. Thoracic spondylosis. Gas density along the mediastinum compatible with esophageal dilatation. Tubing projects over the left abdomen. Upper lumbar compression fracture, likely chronic. IMPRESSION: 1. Moderate left and trace right pleural effusions. Associated passive atelectasis. 2. Suspected dilated esophagus. 3. Likely chronic upper lumbar compression fracture. Electronically Signed   By: Van Clines M.D.   On: 03/28/2017 12:33   Study Result   CLINICAL DATA:  Subsequent treatment strategy for esophageal cancer.  EXAM: NUCLEAR MEDICINE PET SKULL BASE TO THIGH  TECHNIQUE: 6.6 mCi F-18 FDG was injected intravenously. Full-ring PET imaging was performed from the skull base to thigh after the radiotracer. CT data was obtained and used for attenuation correction and anatomic localization.  FASTING BLOOD GLUCOSE:  Value: 123 mg/dl  COMPARISON:  PET-CT 11/12/2016  and 08/14/2016.  FINDINGS: NECK  No hypermetabolic cervical lymph nodes are identified.There are no lesions of the pharyngeal mucosal space.  CHEST  There are no hypermetabolic mediastinal, hilar or axillary lymph nodes. Patient is status post esophagectomy and gastric pull-through. There is no abnormal associated metabolic activity. There is no suspicious pleural or pulmonary activity. Moderate size partially loculated left pleural effusion and small dependent right pleural effusion are unchanged. There is stable dependent left lower lobe atelectasis. No suspicious pulmonary nodule.  ABDOMEN/PELVIS  There is no hypermetabolic activity within the liver, right adrenal gland, spleen or pancreas. Mildly hypermetabolic left adrenal nodule is again noted. This has an SUV max of 4.9 (3.7 previously, and 6.2 on the study before that There is no  hypermetabolic nodal activity. Focal hypermetabolic activity is again noted in the left prostate lobe anteriorly. This has an SUV max of 14.4 also similar to prior study. There is stable metabolic activity surrounding the insertion of the gastrojejunostomy. Bilateral renal cysts are noted.  SKELETON  There is no hypermetabolic activity to suggest osseous metastatic disease. Stable mild chronic compression deformity at L1. There are few scattered Schmorl's nodes in the thoracic spine.  IMPRESSION: 1. No activity suspicious for metastatic adenocarcinoma status post esophagectomy and gastric pull-through. 2. Stable mild prominence of the left adrenal gland and associated hypermetabolic activity from several prior studies. The lack of change implies a benign etiology. No abnormal activity within the right adrenal gland. 3. Persistent focal activity peripherally in the left lobe of the prostate, unchanged from the most recent study. Appearance is nonspecific, and could reflect prostate cancer or a benign nodule. Focal prostatitis less likely given persistence. 4. Stable loculated left pleural effusion and left basilar atelectasis. 5. These results were called by telephone at the time of interpretation on 02/20/2017 at 1:47 pm to Dr. Truitt Merle , who verbally acknowledged these results.   Electronically Signed   By: Richardean Sale M.D.   On: 02/20/2017 13:50       I have independently reviewed the above radiology studies  and reviewed the findings with the patient.   Recent Lab Findings: Lab Results  Component Value Date   WBC 5.0 03/20/2017   HGB 12.6 (L) 03/20/2017   HCT 36.9 (L) 03/20/2017   PLT 181 03/20/2017   GLUCOSE 104 03/20/2017   ALT 13 03/20/2017   AST 22 03/20/2017   NA 139 03/20/2017   K 3.9 03/20/2017   CL 108 08/31/2016   CREATININE 0.7 03/20/2017   BUN 7.2 03/20/2017   CO2 25 03/20/2017   TSH 2.422 05/03/2016   INR 1.42 08/28/2016   Wt Readings  from Last 3 Encounters:  03/28/17 132 lb 3.2 oz (60 kg)  03/20/17 134 lb 12 oz (61.1 kg)  03/06/17 135 lb 12.8 oz (61.6 kg)      Assessment / Plan:   We'll plan on Gastrografin swallow in the near future to rule out the possibility of anastomotic stricture in the cervical esophagus, if there is a stricture present we'll plan on contacting GI to perform esophageal dilatation. Stable moderate left pleural effusion slightly decreased-   Patient range clinically stable with known stage IV esophageal cancer    [name] MD      [address] [place] [address] Office [phone]   Beeper [phone]  03/28/2017 1:30 PM

## 2017-04-01 NOTE — Progress Notes (Signed)
[place]  [phone] [phone]  Clinic Follow up Note   Patient Care Team: [name], MD as PCP - General (Internal Medicine) Truitt Merle, MD as Consulting Physician (Hematology) Kyung Rudd, MD as Consulting Physician (Radiation Oncology) [name], MD as Consulting Physician (Cardiothoracic Surgery) Karie Mainland, RD as Dietitian (Nutrition) Minus Breeding, MD as Consulting Physician (Cardiology)   CHIEF COMPLAINTS:  Follow up metastatic esophageal cancer    Oncology History   Presented with solid dysphagia at least daily with chest pain. Involuntary weight loss of 10-12 lb over past year  Malignant neoplasm of lower third of esophagus (HCC)   Staging form: Esophagus - Adenocarcinoma, AJCC 7th Edition   - Clinical stage from 12/15/2015: Stage IIIA (T3, N1, M0) - Signed by Truitt Merle, MD on 12/24/2015   - Pathologic stage from 04/26/2016: yT0, N0, cM0, GX - Signed by [name], MD on 04/27/2016      Malignant neoplasm of lower third of esophagus ([place])   11/23/2015 Procedure    EGD per [facility](Dr. Toledo):6cm mass in lower third of esophagus      11/24/2015 Initial Diagnosis    Esophageal cancer ([place])      11/24/2015 Pathology Results    Mucinous adenocarcinoma, moderately differentiated-Her2 analysis pending      12/05/2015 Imaging    PET scan showed hypermetabolic a mass in distal esophagus, hypermetabolic activity in the left anterior prostate gland, metabolic density in left adrenal nodule, CT attenuation suggest a benign adrenal adenoma. Left apical 2 mm lung nodule.      12/15/2015 Procedure    EUS showed a uT3N1 lesion from 27cm to 35cm from the incisors       12/26/2015 - 02/02/2016 Chemotherapy    Weekly carboplatin AUC 2 and Taxol 45 mg/m, with concurrent radiation      12/26/2015 - 02/02/2016 Radiation Therapy    Neoadjuvant chemoradiation to the esophageal cancer      04/23/2016  Surgery    total esophagectomy with cervical esophagogastrostomy, pyloroplasty, feeding jejunostomy       04/23/2016 Pathology Results    Esophagectomy showed no residual tumor, 8 lymph nodes were all negative, incidental leiomyoma 0.2 cm      08/14/2016 PET scan    IMPRESSION: 1. Small hypermetabolic left adrenal mass, this has not changed in size compared to 12/01/15 but has a maximum standard uptake value of 6.2 (previously 4.2). Indeterminate density and MRI characteristics, not specific for adenoma. Early metastatic disease is not readily excluded although the lack of change of size is somewhat reassuring. Surveillance recommended. 2. No other hypermetabolic lesions are identified. 3. Moderate to large left pleural effusion with passive atelectasis. 4. Esophagectomy with gastric pull-through. 5. No hypermetabolic liver lesions are seen. 6. Enlarged prostate gland.      08/16/2016 - 08/23/2016 Hospital Admission    He was diagnosed with DIC, unclear etiology. He was given blood products. He underwent echocardiogram which was negative for endocarditis, CTA chest/abd which was negative for aneurysm or dissection as work up for DIC. He was on prednisone for some time, but stopped as it was not helping much for DIC.       08/28/2016 - 09/01/2016 Hospital Admission    He has had issues with recurrent L sided pleural effusions, DIC and melena. He was last admitted in 1/25, given supportive care with a thoracentesis and blood transfusion. Dr Burr Medico has been following DIC and has requested a direct admit for melena,  anemia and thrombocytopenia. GI called for EGD.        08/31/2016 Pathology Results    Bone Marrow Biopsy, right iliac - METASTATIC ADENOCARCINOMA. - SEE COMMENT. PERIPHERAL BLOOD: - NORMOCYTIC ANEMIA. - THROMBOCYTOPENIA.      08/31/2016 Progression    Bone marrow biopsy showed metastatic adenocarcinoma      09/04/2016 -  Chemotherapy    FOLFOX every 2 weeks; changed to  maintenance 5-FU every 2 weeks starting 02/06/17      09/19/2016 Procedure    Therapeutic thoracentesis of a left pleural effusion performed on 09/19/16 yielded 1.5 liters of blood-tinged fluid. Reactive appearing mesothelial cells were present.      11/12/2016 PET scan     IMPRESSION: 1. Heterogeneous bony uptake with several mildly avid foci primarily in the lower thoracic spine, lumbar spine, and pelvic bones consistent with known bony metastatic disease. 2. There is new uptake in the right adrenal gland not seen previously with no CT correlate. Recommend attention on follow-up. 3. A rounded focus of uptake in the anterior peripheral left prostate is larger in the interval. The findings are nonspecific and could be infectious, inflammatory, or neoplastic. Focal prostatitis or prostate cancer are most likely. 4. The uptake in the left adrenal gland has decreased in the interval. 5. Mild uptake in the bilateral hila may simply represent reactive nodes. Recommend attention on follow-up. 6. The left-sided pleural effusion is a little larger in the interval with loculated components.      02/20/2017 PET scan    PET 02/20/17 IMPRESSION: 1. No activity suspicious for metastatic adenocarcinoma status post esophagectomy and gastric pull-through. 2. Stable mild prominence of the left adrenal gland and associated hypermetabolic activity from several prior studies. The lack of change implies a benign etiology. No abnormal activity within the right adrenal gland. 3. Persistent focal activity peripherally in the left lobe of the prostate, unchanged from the most recent study. Appearance is nonspecific, and could reflect prostate cancer or a benign nodule. Focal prostatitis less likely given persistence. 4. Stable loculated left pleural effusion and left basilar atelectasis. 5. These results were called by telephone at the time of interpretation on 02/20/2017 at 1:47 pm to Dr. Truitt Merle , who  verbally acknowledged these results.       HISTORY OF PRESENTING ILLNESS (12/14/2015):  Fred [name] 76 y.o. male is here because of His newly diagnosed esophageal cancer. He is accompanied by his wife and daughter to my clinic today.  He has had dysphagia for 2 months, mainly with solid food, no dysphagia with soft food or liquid. He also has mild pain in mid chest when he swallows. No nausea, abdominal pain, or other discomfort. He has lost about 15 lbs in the last year, no other symoptoms. He was seen by his primary care physician at [facility], and referred to gastroenterologist Dr. Alice Reichert. He underwent EGD on 11/23/2015, which showed a 6 cm mass in the lower third of esophagus. Biopsy showed adenocarcinoma, moderately differentiated. He was referred to [place] to consider neoadjuvant therapy. He is scheduled to see a Psychologist, sport and exercise at [facility] later this week.  He has had some urinary frequency, underwent TURP on 11/02/2015.   CURRENT THERAPY: first line chemo FOLFOX every 2 weeks started on 09/04/2016; changed to maintenance 5-FU every 2 weeks starting 02/06/17.  INTERIM HISTORY:  Fred [name] returns for follow up and treatment. He presents to the clinic today with his wife.  He presents with a slight wound on his  right wrist that is healing.  He notes numbness on his fingertips. It takes longer for him to button his buttons but can walk fine and hold on to objects still.  His wife reports a swallow test coming up tomorrow as he feels food is getting caught in his throat. They want to know will his pump interfere with his test.  With 5-FU he will get nauseous when he eats food that does not go down his throat comfortably. His wife asks if our clinic can order a endoscopy instead of through Dr. Servando Snare.  He will not see him again until 3 months from now.  His appetite is not good, but he thinks his energy fluctuates, not much has changed.  He reports to holding the feeding tube for 2 weeks and [place] 4  [place] He will go back on feeding tube this week.     MEDICAL HISTORY:  Past Medical History:  Diagnosis Date  . Esophageal cancer ([place]) 11/23/15   lower 3rd esohagus   . GERD (gastroesophageal reflux disease)   . Hyperlipidemia   . Hypertension     SURGICAL HISTORY: Past Surgical History:  Procedure Laterality Date  . CHEST TUBE INSERTION Left 04/23/2016   Procedure: CHEST TUBE INSERTION;  Surgeon: [name], MD;  Location: [place];  Service: Thoracic;  Laterality: Left;  . COLONOSCOPY N/A 08/30/2016   Procedure: COLONOSCOPY;  Surgeon: Milus Banister, MD;  Location: [facility];  Service: Endoscopy;  Laterality: N/A;  . COMPLETE ESOPHAGECTOMY N/A 04/23/2016   Procedure: TRANSHIATIAL TOTAL ESOPHAGECTOMY COMPLETE; CERVICAL ESOPHAGOGASTROSTOMY AND PYLOROPLASTY;  Surgeon: [name], MD;  Location: [place];  Service: Thoracic;  Laterality: N/A;  . cystoscope  4/12/1   prostate  . ERCP    . ESOPHAGOGASTRODUODENOSCOPY (EGD) WITH PROPOFOL N/A 08/29/2016   Procedure: ESOPHAGOGASTRODUODENOSCOPY (EGD) WITH PROPOFOL;  Surgeon: Milus Banister, MD;  Location: [facility];  Service: Endoscopy;  Laterality: N/A;  . INGUINAL HERNIA REPAIR    . IR GENERIC HISTORICAL  08/31/2016   IR FLUORO GUIDE PORT INSERTION RIGHT 08/31/2016 Corrie Mckusick, DO [facility]  . IR GENERIC HISTORICAL  08/31/2016   IR US GUIDE VASC ACCESS RIGHT 08/31/2016 Corrie Mckusick, DO [facility]  . JEJUNOSTOMY N/A 04/23/2016   Procedure: FEEDING JEJUNOSTOMY;  Surgeon: [name], MD;  Location: [place];  Service: Thoracic;  Laterality: N/A;  . LEG SURGERY Left    hole  . PROSTATE BIOPSY  10/05/15  . right shoulder surgery    . VIDEO BRONCHOSCOPY N/A 04/23/2016   Procedure: VIDEO BRONCHOSCOPY;  Surgeon: [name], MD;  Location: [facility] OR;  Service: Thoracic;  Laterality: N/A;    SOCIAL HISTORY: Social History   Social History  . Marital status: Married    Spouse name: N/A  .  Number of children: 1  . Years of education: N/A   Occupational History  . Truck Geophysicist/field seismologist    Social History Main Topics  . Smoking status: Former Smoker    Packs/day: 1.00    Years: 10.00    Quit date: 07/24/1967  . Smokeless tobacco: Never Used  . Alcohol use No  . Drug use: No  . Sexual activity: Not Currently   Other Topics Concern  . Not on file   Social History Narrative  . No narrative on file    FAMILY HISTORY: Family History  Problem Relation Age of Onset  . Cancer Maternal Grandfather 3  gastric cancer   . Alzheimer's disease Mother   . Diabetes Mother   . Stroke Father 60  . Multiple sclerosis Sister     ALLERGIES:  is allergic to no known allergies.  MEDICATIONS:  Current Outpatient Prescriptions  Medication Sig Dispense Refill  . acetaminophen (TYLENOL) 325 MG tablet Take 650 mg by mouth every 6 (six) hours as needed.    Marland Kitchen atenolol (TENORMIN) 25 MG tablet Take 1 tab in the morning and half tab in the evening (Patient taking differently: Take 12.5-25 mg by mouth 2 (two) times daily. Take '25mg'$  in the morning and 12.'5mg'$  in the evening) 60 tablet 1  . folic acid (FOLVITE) 1 MG tablet Take 1 tablet (1 mg total) by mouth daily. 30 tablet 0  . lidocaine-prilocaine (EMLA) cream Apply 1 application topically as needed. Apply 1-2 tsp over port site 1 hour prior to chemotherapy 30 g 1  . mirtazapine (REMERON) 15 MG tablet Take 1 tablet (15 mg total) by mouth at bedtime. 30 tablet 2  . Nutritional Supplements (NUTREN 1.5) LIQD 250 mLs by Jejunal Tube route at bedtime. 3 CANS VIA FEEDING TUBE    . ondansetron (ZOFRAN ODT) 8 MG disintegrating tablet Take 1 tablet (8 mg total) by mouth every 8 (eight) hours as needed for nausea or vomiting. 30 tablet 2  . oxycodone (OXY-IR) 5 MG capsule Take 5 mg by mouth every 6 (six) hours as needed for pain.     Marland Kitchen prochlorperazine (COMPAZINE) 10 MG tablet Take 1 tablet (10 mg total) by mouth every 6 (six) hours as needed for nausea or  vomiting. 30 tablet 3  . vitamin B-12 1000 MCG tablet Take 1 tablet (1,000 mcg total) by mouth daily. 30 tablet 0   No current facility-administered medications for this visit.    Facility-Administered Medications Ordered in Other Visits  Medication Dose Route Frequency Provider Last Rate Last Dose  . fluorouracil (ADRUCIL) 3,950 mg in sodium chloride 0.9 % 71 mL chemo infusion  2,400 mg/m2 (Treatment Plan Recorded) Intravenous 1 day or 1 dose Truitt Merle, MD   3,950 mg at 04/03/17 1345    REVIEW OF SYSTEMS:   Constitutional: Denies fevers, chills or abnormal night sweats  (+) low appetite (+) Weight loss Eyes: Denies blurriness of vision, double vision or watery eyes Ears, nose, mouth, throat, and face: Denies mucositis or sore throat (+) occasionally having trouble swallowing, Solids > Liquids  Respiratory: Denies cough, dyspnea or wheezes Cardiovascular: Denies palpitation, chest discomfort or lower extremity swelling Gastrointestinal:  Denies heartburn or change in bowel habits (+) Drainage around J-tube. (+) some bleeding from feeding tube  Skin: Denies abnormal skin rashes Lymphatics: Denies new lymphadenopathy or easy bruising Neurological:Denies numbness, tingling or new weaknesses (+) Neuropathy, worsening numbness in hands.  Behavioral/Psych: Mood is stable, no new changes  Musculoskeletal: All other systems were reviewed with the patient and are negative.  PHYSICAL EXAMINATION:  ECOG PERFORMANCE STATUS: 2 Vitals:   04/03/17 1133  BP: (!) 96/56  Pulse: 68  Resp: 18  Temp: 98.2 F (36.8 C)  TempSrc: Oral  SpO2: 99%  Weight: 131 lb 11.2 oz (59.7 kg)  Height: '5\' 5"'$  (1.651 m)    GENERAL:alert, no distress and comfortable, in wheelchair SKIN: Appears to be pale, skin texture, turgor are normal, no rashes or significant lesions EYES: normal, conjunctiva are pink and non-injected, sclera clear OROPHARYNX:no exudate, no erythema and lips, buccal mucosa, and tongue normal    NECK: supple, thyroid normal size, non-tender,  without nodularity LYMPH:  no palpable lymphadenopathy in the cervical, axillary or inguinal LUNGS: clear to auscultation and percussion with normal breathing effort HEART: regular rate & rhythm and no murmurs and no lower extremity edema ABDOMEN:abdomen soft, non-tender and normal bowel sounds, J tube in place. No skin erythema or pus. (+) Feeding tube site with old blood around feeding tube site, no discharge. No skin erythema around J tube. Normal mucosa  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  LABORATORY DATA:  I have reviewed the data as listed CBC Latest Ref Rng & Units 04/03/2017 03/20/2017 03/06/2017  WBC 4.0 - 10.3 10e3/uL 6.0 5.0 5.3  Hemoglobin 13.0 - 17.1 g/dL 12.7(L) 12.6(L) 12.0(L)  Hematocrit 38.4 - 49.9 % 37.3(L) 36.9(L) 34.9(L)  Platelets 140 - 400 10e3/uL 184 181 186   CMP Latest Ref Rng & Units 04/03/2017 03/20/2017 03/06/2017  Glucose 70 - 140 mg/dl 113 104 100  BUN 7.0 - 26.0 mg/dL 9.5 7.2 9.4  Creatinine 0.7 - 1.3 mg/dL 0.6(L) 0.7 0.6(L)  Sodium 136 - 145 mEq/L 138 139 137  Potassium 3.5 - 5.1 mEq/L 4.3 3.9 4.2  Chloride 101 - 111 mmol/L - - -  CO2 22 - 29 mEq/L '25 25 25  '$ Calcium 8.4 - 10.4 mg/dL 9.4 9.3 9.4  Total Protein 6.4 - 8.3 g/dL 6.5 6.5 6.3(L)  Total Bilirubin 0.20 - 1.20 mg/dL 0.39 0.54 0.43  Alkaline Phos 40 - 150 U/L 132 122 142  AST 5 - 34 U/L '16 22 22  '$ ALT 0 - 55 U/L '8 13 12     '$ Results for Fred, [name] (MRN [id number]) as of 04/01/2017 09:25  Ref. Range 02/06/2017 11:19 02/20/2017 12:10 03/20/2017 09:42  CEA ([facility]) Latest Ref Range: 0.00 - 5.00 ng/mL 3.11 2.89 3.37       Pathology report [facility] TESTING 09/29/2016   PD-L1 Testing 09/29/2016   Diagnosis 09/27/2016 Consult- Comprehensive, [id number] - 1A Mass, Lower third of Esophagus, Mucosal Biopsy -  ADENOCARCINOMA WITH EXTRACELLULAR MUCIN. - SEE COMMENT. Microscopic Comment Provided Her2 IHC with the appropriate controls is negative. Per report Her2 FISH is also negative. There is limited tissue remaining, but [facility] One and PDL-1 testing will be attempted as requested.  Diagnosis 09/19/2016 PLEURAL FLUID, LEFT (SPECIMEN 1 OF 1 COLLECTED 09/19/16): REACTIVE APPEARING MESOTHELIAL CELLS PRESENT.  Diagnosis 08/31/2016 Bone Marrow Biopsy, right iliac - METASTATIC ADENOCARCINOMA. - SEE COMMENT. PERIPHERAL BLOOD: - NORMOCYTIC ANEMIA. - THROMBOCYTOPENIA.  Diagnosis 08/02/16 PLEURAL FLUID, LEFT (SPECIMEN 1 OF 1 COLLECTED 08/02/16): REACTIVE MESOTHELIAL CELLS PRESENT. LYMPHOCYTES PRESENT.  Diagnosis 11/23/2015 Mass, lower third of the esophagus, mucosal biopsy Mucinous adenocarcinoma, moderately differentiated.  Diagnosis 04/23/2016 1. Lymph node, biopsy, Cervical - ONE OF ONE LYMPH NODES NEGATIVE FOR CARCINOMA (0/1). 2. Lymph node, biopsy, Periesophageal - BLOOD WITH SCANT BENIGN SOFT TISSUE AND SKELETAL MUSCLE. - NO MALIGNANCY IDENTIFIED. 3. Esophagogastrectomy - ULCERATION WITH INFLAMMATION AND REACTIVE CHANGES. - FOCAL INTESTINAL METAPLASIA. - CHANGES CONSISTENT WITH TREATMENT EFFECT. - SEVEN OF SEVEN LYMPH NODES NEGATIVE FOR CARCINOMA (0/7). - INCIDENTAL LEIOMYOMA, 0.2 CM. - MARGINS ARE NEGATIVE FOR DYSPLASIA OR MALIGNANCY. - SEE ONCOLOGY TABLE. Microscopic Comment 3. ESOPHAGUS: Specimen: Esophagus and proximal stomach, cervical lymph node. Procedure: Esophagogastrectomy and cervical lymph node biopsy. Tumor Site: Presumed GE junction, see comment. Relationship of Tumor to esophagogastric junction: Can not be assessed. Distance of tumor center from esophagogastric junction:  Can not be assessed. Tumor Size Greatest dimension: No residual tumor present. Histologic Type: Per medical record adenocarcinoma (no biopsy for review). Histologic Grade: N/A. Microscopic Tumor  Extension: N/A. Margins: Negative. Treatment Effect: Present (TRS 0). Lymph-Vascular Invasion: Not identified. Perineural Invasion: Not identified. Lymph nodes: number examined 8; number positive: 0 TNM: ypT0, ypN0 see comment. 1 of 3 FINAL for Fred [name], Fred [name] ([id number]) Microscopic Comment(continued) Ancillary studies: None. Comments: The entire GE junction is submitted for evaluation. There is ulceration with associated inflammation. There is acellular mucin/myxoid changes which extend through the muscularis propria, but no viable/residual tumor is identified and thus the stage is ypT0. There is focal background intestinal metaplasia, which may have been pre-existing (Barrett's esophagus) or related to therapy.   RADIOGRAPHIC STUDIES: I have personally reviewed the radiological images as listed and agreed with the findings in the report. Dg Chest 2 View  Result Date: 03/28/2017 CLINICAL DATA:  Esophageal cancer. EXAM: CHEST  2 VIEW COMPARISON:  02/19/2017 PET-CT FINDINGS: Moderate size left pleural effusion with passive atelectasis. Power injectable right Port-A-Cath tip: SVC. Trace right pleural effusion with blunting of the right costophrenic angle. Thoracic spondylosis. Gas density along the mediastinum compatible with esophageal dilatation. Tubing projects over the left abdomen. Upper lumbar compression fracture, likely chronic. IMPRESSION: 1. Moderate left and trace right pleural effusions. Associated passive atelectasis. 2. Suspected dilated esophagus. 3. Likely chronic upper lumbar compression fracture. Electronically Signed   By: Van Clines M.D.   On: 03/28/2017 12:33   PET 08/14/2016 IMPRESSION: 1. Small hypermetabolic left adrenal mass, this has not changed in size compared to 12/01/15 but has a maximum standard uptake value of 6.2 (previously 4.2). Indeterminate density and MRI characteristics, not specific for adenoma. Early metastatic disease is not readily excluded  although the lack of change of size is somewhat reassuring. Surveillance recommended. 2. No other hypermetabolic lesions are identified. 3. Moderate to large left pleural effusion with passive atelectasis. 4. Esophagectomy with gastric pull-through. 5. No hypermetabolic liver lesions are seen. 6. Enlarged prostate gland.  CT angio chest/abdomen/pelvis for dissection w/wo contrast 08/22/2016 IMPRESSION: No evidence of aortic aneurysm or dissection. No evidence of pulmonary embolus. Heart is borderline in size. Stable small left adrenal nodule which was hypermetabolic on prior PET CT. Stable large left pleural effusion with compressive atelectasis in the left lower lobe. Stable appearance of the gastric pull-through post esophagectomy.  US Thoracentesis asp pleural space 09/19/2016 IMPRESSION: Successful ultrasound guided diagnostic and therapeutic left thoracentesis yielding 1.5 liters of pleural fluid.  PET 11/12/2016 IMPRESSION: 1. Heterogeneous bony uptake with several mildly avid foci primarily in the lower thoracic spine, lumbar spine, and pelvic bones consistent with known bony metastatic disease. 2. There is new uptake in the right adrenal gland not seen previously with no CT correlate. Recommend attention on follow-up. 3. A rounded focus of uptake in the anterior peripheral left prostate is larger in the interval. The findings are nonspecific and could be infectious, inflammatory, or neoplastic. Focal prostatitis or prostate cancer are most likely. 4. The uptake in the left adrenal gland has decreased in the interval. 5. Mild uptake in the bilateral hila may simply represent reactive nodes. Recommend attention on follow-up. 6. The left-sided pleural effusion is a little larger in the interval with loculated components.    ASSESSMENT & PLAN:  76 y.o. male presented with dysphagia with solid food  1. 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 -The initial PET scan showed no definitive distant metastasis. 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 This will be followed in the future scan. -By EUS, he had T3 N1 stage III disease. -He completed neoadjuvant radiation with weekly Carbo and Taxol. -I previously reviewed his surgical pathology findings, he has had complete pathologic response to neoadjuvant chemotherapy and radiation, which predicts good prognosis -Unfortunately he developed diffuse bone metastasis in January 2018, confirmed by bone marrow biopsy -He has started first-line chemotherapy FOLFOX, tolerating well, and his anemia and thrombocytopenia has previously improved/resolved since chemotherapy -The goal of therapy is palliative -I have requested HER2 IHC test but was not sufficient tissue -his tumor was negative for PD-L1 expression and MSI-stable,  he is not a candidate for immunotherapy -I previously reviewed his [facility] One genomic testing results, unfortunately there is no good targeted therapy available. His tumor has MSI stable -He has clinically been doing well overall, tolerating chemo very well  -CEA has decreased significant since starting FOLFOX in February 2018. - restaging PET Scan taken 11/12/16 were reviewed with pt and his family.  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 I also discussed the option of maintenance Xeloda, he opted 5-fu  -I discussed his restaging PET scan from 02/19/2017, which  showed no definitive evidence of hypermetabolic lesions except hypermetabolism in left adrenal gland and left lobe of prostate, which are unlikely related to his metastatic esophageal cancer. -We discussed the role of bone marrow biopsy to see if he has achieved complete response, I would only consider this if we plan to stop chemo. We discussed the incurable nature of his metastatic cancer, I'll hold bone marrow biopsy at this point. If he is clinically doing well, with stable CBC and no evidence of DIC, will continue 5-FU maintenance therapy -After a lengthy discussion we will decide if he should get a blood marrow biopsy after his next scan.  -Lab reviewed, adequate for treatment, will proceed with  5-Fu maintenance therapy today and every 2 weeks.  -I will f/u with him in 4 weeks   2. 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 thrombocytopenia has resolved. -The patient had many blood transfusions with the last on 09/28/16, he has not required any blood transfusion since he started chemotherapy. -Continue monitoring CBC  3. 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  -He has had 3 repeated thoracentesis, all cytology were negative, -Therapeutic thoracentesis performed on 09/19/16 yielded 1.5 liters of blood-tinged fluid. Reactive appearing mesothelial cells were present. -He still has small residual left pleural effusion, overall stable, -Continue monitoring, previously repeated CXR -follow-up with Dr. Pia Mau   4. HTN -Continue medication and follow-up of his primary care physician -his PCP at [facility] has changed his BP meds lately   5. Malnutrition -Doing better, he is tolerating J-tube feeding very well, he eats normally. Encouraged the patient to eat small frequent meals. -he has decreased his tube feeds lately  -Follow up with dietitian -Feeding tube site is clean  and no bleeding lately   6. Anorexia  and depression  -continue mirtazapine. Overall, previously improved lately  -He previously hasn't talked to the nutritionist in a while, he will follow up   7. Fatigue -He would like something to help his energy. -We previously discussed steroids to help for this, but I told them that this is not a long term solution -He would not like steroids at this time.  -We previously discussed fatigue being related to chemo or anemia.  -I previously encouraged him to eat well with protein and be more active   8. Goal of care discussion  -We previously discussed the incurable nature of his cancer, and the overall poor prognosis, especially if he does not have good response to chemotherapy or progress on chemo -The patient understands the goal of care is palliative. -I recommend DNR/DNI, he will think about it   9. 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  -He has been off Oxaliplatin for 2 months now -His numbness has increased further up his hands. He denies any tingling or pain. If this progresses further I may stop 5FU  Plan -Lab reviewed, adequate for treatment, we'll proceed maintenance 5-FU today and continue every 2 weeks -I'll see him back in 4 weeks  -Will keep him posted on pump DC later this week. -will order restaging PET on next visit    All questions were answered. The patient knows to call the clinic with any problems, questions or concerns.  I spent 20 minutes counseling the patient face to face. The total time spent in the appointment was 25 minutes and more than 50% was on counseling.   Truitt Merle,  MD 04/03/2017   This document serves as a record of services personally performed by Truitt Merle, MD. It was created on her behalf by Joslyn Devon, a trained medical scribe. The creation of this record is based on the scribe's personal observations and the provider's statements to them. This document has been checked and approved by the attending provider.

## 2017-04-03 ENCOUNTER — Encounter: Payer: Self-pay | Admitting: Hematology

## 2017-04-03 ENCOUNTER — Ambulatory Visit (HOSPITAL_BASED_OUTPATIENT_CLINIC_OR_DEPARTMENT_OTHER): Payer: Non-veteran care

## 2017-04-03 ENCOUNTER — Other Ambulatory Visit (HOSPITAL_BASED_OUTPATIENT_CLINIC_OR_DEPARTMENT_OTHER): Payer: Non-veteran care

## 2017-04-03 ENCOUNTER — Telehealth: Payer: Self-pay | Admitting: Hematology

## 2017-04-03 ENCOUNTER — Ambulatory Visit: Payer: PRIVATE HEALTH INSURANCE

## 2017-04-03 ENCOUNTER — Ambulatory Visit (HOSPITAL_BASED_OUTPATIENT_CLINIC_OR_DEPARTMENT_OTHER): Payer: Non-veteran care | Admitting: Hematology

## 2017-04-03 VITALS — BP 96/56 | HR 68 | Temp 98.2°F | Resp 18 | Ht 65.0 in | Wt 131.7 lb

## 2017-04-03 DIAGNOSIS — E440 Moderate protein-calorie malnutrition: Secondary | ICD-10-CM | POA: Diagnosis not present

## 2017-04-03 DIAGNOSIS — Z5111 Encounter for antineoplastic chemotherapy: Secondary | ICD-10-CM | POA: Diagnosis not present

## 2017-04-03 DIAGNOSIS — Z95828 Presence of other vascular implants and grafts: Secondary | ICD-10-CM | POA: Insufficient documentation

## 2017-04-03 DIAGNOSIS — C155 Malignant neoplasm of lower third of esophagus: Secondary | ICD-10-CM | POA: Diagnosis not present

## 2017-04-03 DIAGNOSIS — I10 Essential (primary) hypertension: Secondary | ICD-10-CM | POA: Diagnosis not present

## 2017-04-03 DIAGNOSIS — C7951 Secondary malignant neoplasm of bone: Secondary | ICD-10-CM | POA: Diagnosis not present

## 2017-04-03 LAB — COMPREHENSIVE METABOLIC PANEL
ALT: 8 U/L (ref 0–55)
AST: 16 U/L (ref 5–34)
Albumin: 3.5 g/dL (ref 3.5–5.0)
Alkaline Phosphatase: 132 U/L (ref 40–150)
Anion Gap: 8 mEq/L (ref 3–11)
BILIRUBIN TOTAL: 0.39 mg/dL (ref 0.20–1.20)
BUN: 9.5 mg/dL (ref 7.0–26.0)
CO2: 25 meq/L (ref 22–29)
CREATININE: 0.6 mg/dL — AB (ref 0.7–1.3)
Calcium: 9.4 mg/dL (ref 8.4–10.4)
Chloride: 105 mEq/L (ref 98–109)
EGFR: >90 mL/min/{1.73_m2} (ref >90)
GLUCOSE: 113 mg/dL (ref 70–140)
Potassium: 4.3 mEq/L (ref 3.5–5.1)
SODIUM: 138 meq/L (ref 136–145)
TOTAL PROTEIN: 6.5 g/dL (ref 6.4–8.3)

## 2017-04-03 LAB — CBC & DIFF AND RETIC
BASO%: 0.3 % (ref 0.0–2.0)
BASOS ABS: 0 10*3/uL (ref 0.0–0.1)
EOS%: 1.8 % (ref 0.0–7.0)
Eosinophils Absolute: 0.1 10*3/uL (ref 0.0–0.5)
HCT: 37.3 % — ABNORMAL LOW (ref 38.4–49.9)
HEMOGLOBIN: 12.7 g/dL — AB (ref 13.0–17.1)
Immature Retic Fract: 6.9 % (ref 3.00–10.60)
LYMPH%: 14.6 % (ref 14.0–49.0)
MCH: 32.9 pg (ref 27.2–33.4)
MCHC: 34 g/dL (ref 32.0–36.0)
MCV: 96.6 fL (ref 79.3–98.0)
MONO#: 0.8 10*3/uL (ref 0.1–0.9)
MONO%: 12.8 % (ref 0.0–14.0)
NEUT%: 70.5 % (ref 39.0–75.0)
NEUTROS ABS: 4.2 10*3/uL (ref 1.5–6.5)
Platelets: 184 10*3/uL (ref 140–400)
RBC: 3.86 10*6/uL — AB (ref 4.20–5.82)
RDW: 14.4 % (ref 11.0–14.6)
RETIC %: 1.68 % (ref 0.80–1.80)
Retic Ct Abs: 64.85 10*3/uL (ref 34.80–93.90)
WBC: 6 10*3/uL (ref 4.0–10.3)
lymph#: 0.9 10*3/uL (ref 0.9–3.3)

## 2017-04-03 MED ORDER — SODIUM CHLORIDE 0.9% FLUSH
10.0000 mL | Freq: Once | INTRAVENOUS | Status: AC
  Administered 2017-04-03: 10 mL
  Filled 2017-04-03: qty 10

## 2017-04-03 MED ORDER — PROCHLORPERAZINE MALEATE 10 MG PO TABS
ORAL_TABLET | ORAL | Status: AC
  Filled 2017-04-03: qty 1

## 2017-04-03 MED ORDER — SODIUM CHLORIDE 0.9 % IV SOLN
2400.0000 mg/m2 | INTRAVENOUS | Status: DC
  Administered 2017-04-03: 3950 mg via INTRAVENOUS
  Filled 2017-04-03: qty 79

## 2017-04-03 MED ORDER — PROCHLORPERAZINE MALEATE 10 MG PO TABS
10.0000 mg | ORAL_TABLET | Freq: Once | ORAL | Status: AC
  Administered 2017-04-03: 10 mg via ORAL

## 2017-04-03 NOTE — Patient Instructions (Signed)
[facility] Discharge Instructions for Patients Receiving Chemotherapy  Today you received the following chemotherapy agents: Adrucil   To help prevent nausea and vomiting after your treatment, we encourage you to take your nausea medication as directed.    If you develop nausea and vomiting that is not controlled by your nausea medication, call the clinic.   BELOW ARE SYMPTOMS THAT SHOULD BE REPORTED IMMEDIATELY:  *FEVER GREATER THAN 100.5 F  *CHILLS WITH OR WITHOUT FEVER  NAUSEA AND VOMITING THAT IS NOT CONTROLLED WITH YOUR NAUSEA MEDICATION  *UNUSUAL SHORTNESS OF BREATH  *UNUSUAL BRUISING OR BLEEDING  TENDERNESS IN MOUTH AND THROAT WITH OR WITHOUT PRESENCE OF ULCERS  *URINARY PROBLEMS  *BOWEL PROBLEMS  UNUSUAL RASH Items with * indicate a potential emergency and should be followed up as soon as possible.  Feel free to call the clinic you have any questions or concerns. The clinic phone number is [phone].  Please show the CHEMO ALERT CARD at check-in to the Emergency Department and triage nurse.   

## 2017-04-03 NOTE — Telephone Encounter (Signed)
Scheduled appt per 9/12 los - Gave patient AVS and calender per los.  

## 2017-04-04 ENCOUNTER — Ambulatory Visit
Admission: RE | Admit: 2017-04-04 | Discharge: 2017-04-04 | Disposition: A | Discharge status: Home / Self Care | Payer: PRIVATE HEALTH INSURANCE | Source: Ambulatory Visit | Attending: Cardiothoracic Surgery | Admitting: Cardiothoracic Surgery

## 2017-04-04 DIAGNOSIS — C155 Malignant neoplasm of lower third of esophagus: Secondary | ICD-10-CM

## 2017-04-04 DIAGNOSIS — K222 Esophageal obstruction: Secondary | ICD-10-CM

## 2017-04-05 ENCOUNTER — Ambulatory Visit (HOSPITAL_BASED_OUTPATIENT_CLINIC_OR_DEPARTMENT_OTHER): Payer: Non-veteran care

## 2017-04-05 VITALS — BP 105/60 | HR 79 | Temp 98.1°F | Resp 18

## 2017-04-05 DIAGNOSIS — C155 Malignant neoplasm of lower third of esophagus: Secondary | ICD-10-CM

## 2017-04-05 MED ORDER — HEPARIN SOD (PORK) LOCK FLUSH 100 UNIT/ML IV SOLN
500.0000 [IU] | Freq: Once | INTRAVENOUS | Status: AC | PRN
  Administered 2017-04-05: 500 [IU]
  Filled 2017-04-05: qty 5

## 2017-04-05 MED ORDER — SODIUM CHLORIDE 0.9% FLUSH
10.0000 mL | INTRAVENOUS | Status: DC | PRN
  Administered 2017-04-05: 10 mL
  Filled 2017-04-05: qty 10

## 2017-04-05 NOTE — Patient Instructions (Signed)
Implanted Port Home Guide An implanted port is a type of central line that is placed under the skin. Central lines are used to provide IV access when treatment or nutrition needs to be given through a person's veins. Implanted ports are used for long-term IV access. An implanted port may be placed because:  You need IV medicine that would be irritating to the small veins in your hands or arms.  You need long-term IV medicines, such as antibiotics.  You need IV nutrition for a long period.  You need frequent blood draws for lab tests.  You need dialysis.  Implanted ports are usually placed in the chest area, but they can also be placed in the upper arm, the abdomen, or the leg. An implanted port has two main parts:  Reservoir. The reservoir is round and will appear as a small, raised area under your skin. The reservoir is the part where a needle is inserted to give medicines or draw blood.  Catheter. The catheter is a thin, flexible tube that extends from the reservoir. The catheter is placed into a large vein. Medicine that is inserted into the reservoir goes into the catheter and then into the vein.  How will I care for my incision site? Do not get the incision site wet. Bathe or shower as directed by your health care provider. How is my port accessed? Special steps must be taken to access the port:  Before the port is accessed, a numbing cream can be placed on the skin. This helps numb the skin over the port site.  Your health care provider uses a sterile technique to access the port. ? Your health care provider must put on a mask and sterile gloves. ? The skin over your port is cleaned carefully with an antiseptic and allowed to dry. ? The port is gently pinched between sterile gloves, and a needle is inserted into the port.  Only "non-coring" port needles should be used to access the port. Once the port is accessed, a blood return should be checked. This helps ensure that the port  is in the vein and is not clogged.  If your port needs to remain accessed for a constant infusion, a clear (transparent) bandage will be placed over the needle site. The bandage and needle will need to be changed every week, or as directed by your health care provider.  Keep the bandage covering the needle clean and dry. Do not get it wet. Follow your health care provider's instructions on how to take a shower or bath while the port is accessed.  If your port does not need to stay accessed, no bandage is needed over the port.  What is flushing? Flushing helps keep the port from getting clogged. Follow your health care provider's instructions on how and when to flush the port. Ports are usually flushed with saline solution or a medicine called heparin. The need for flushing will depend on how the port is used.  If the port is used for intermittent medicines or blood draws, the port will need to be flushed: ? After medicines have been given. ? After blood has been drawn. ? As part of routine maintenance.  If a constant infusion is running, the port may not need to be flushed.  How long will my port stay implanted? The port can stay in for as long as your health care provider thinks it is needed. When it is time for the port to come out, surgery will be   done to remove it. The procedure is similar to the one performed when the port was put in. When should I seek immediate medical care? When you have an implanted port, you should seek immediate medical care if:  You notice a bad smell coming from the incision site.  You have swelling, redness, or drainage at the incision site.  You have more swelling or pain at the port site or the surrounding area.  You have a fever that is not controlled with medicine.  This information is not intended to replace advice given to you by your health care provider. Make sure you discuss any questions you have with your health care provider. Document  Released: 07/09/2005 Document Revised: 12/15/2015 Document Reviewed: 03/16/2013 [employer] Interactive Patient Education  2017 [employer]  

## 2017-04-17 ENCOUNTER — Telehealth: Payer: Self-pay

## 2017-04-17 ENCOUNTER — Other Ambulatory Visit (HOSPITAL_BASED_OUTPATIENT_CLINIC_OR_DEPARTMENT_OTHER): Payer: Non-veteran care

## 2017-04-17 ENCOUNTER — Ambulatory Visit: Payer: PRIVATE HEALTH INSURANCE

## 2017-04-17 ENCOUNTER — Ambulatory Visit (HOSPITAL_BASED_OUTPATIENT_CLINIC_OR_DEPARTMENT_OTHER): Payer: Non-veteran care

## 2017-04-17 ENCOUNTER — Ambulatory Visit: Payer: PRIVATE HEALTH INSURANCE | Admitting: Nutrition

## 2017-04-17 VITALS — BP 119/57 | HR 73 | Temp 98.2°F | Resp 17

## 2017-04-17 DIAGNOSIS — Z5111 Encounter for antineoplastic chemotherapy: Secondary | ICD-10-CM | POA: Diagnosis not present

## 2017-04-17 DIAGNOSIS — C155 Malignant neoplasm of lower third of esophagus: Secondary | ICD-10-CM

## 2017-04-17 DIAGNOSIS — Z95828 Presence of other vascular implants and grafts: Secondary | ICD-10-CM

## 2017-04-17 LAB — COMPREHENSIVE METABOLIC PANEL
ALK PHOS: 150 U/L (ref 40–150)
ALT: 11 U/L (ref 0–55)
ANION GAP: 9 meq/L (ref 3–11)
AST: 20 U/L (ref 5–34)
Albumin: 3.5 g/dL (ref 3.5–5.0)
BUN: 8.2 mg/dL (ref 7.0–26.0)
CHLORIDE: 104 meq/L (ref 98–109)
CO2: 25 mEq/L (ref 22–29)
Calcium: 9.3 mg/dL (ref 8.4–10.4)
Creatinine: 0.6 mg/dL — ABNORMAL LOW (ref 0.7–1.3)
EGFR: >90 mL/min/{1.73_m2} (ref >90)
Glucose: 101 mg/dl (ref 70–140)
POTASSIUM: 4.2 meq/L (ref 3.5–5.1)
Sodium: 138 mEq/L (ref 136–145)
Total Bilirubin: 0.41 mg/dL (ref 0.20–1.20)
Total Protein: 6.5 g/dL (ref 6.4–8.3)

## 2017-04-17 LAB — CBC & DIFF AND RETIC
BASO%: 0.2 % (ref 0.0–2.0)
Basophils Absolute: 0 10*3/uL (ref 0.0–0.1)
EOS ABS: 0.1 10*3/uL (ref 0.0–0.5)
EOS%: 2 % (ref 0.0–7.0)
HCT: 36.7 % — ABNORMAL LOW (ref 38.4–49.9)
HEMOGLOBIN: 12.7 g/dL — AB (ref 13.0–17.1)
IMMATURE RETIC FRACT: 5.3 % (ref 3.00–10.60)
LYMPH%: 18 % (ref 14.0–49.0)
MCH: 33.3 pg (ref 27.2–33.4)
MCHC: 34.6 g/dL (ref 32.0–36.0)
MCV: 96.3 fL (ref 79.3–98.0)
MONO#: 0.7 10*3/uL (ref 0.1–0.9)
MONO%: 10.7 % (ref 0.0–14.0)
NEUT%: 69.1 % (ref 39.0–75.0)
NEUTROS ABS: 4.2 10*3/uL (ref 1.5–6.5)
Platelets: 174 10*3/uL (ref 140–400)
RBC: 3.81 10*6/uL — AB (ref 4.20–5.82)
RDW: 14.5 % (ref 11.0–14.6)
RETIC %: 1.77 % (ref 0.80–1.80)
Retic Ct Abs: 67.44 10*3/uL (ref 34.80–93.90)
WBC: 6.1 10*3/uL (ref 4.0–10.3)
lymph#: 1.1 10*3/uL (ref 0.9–3.3)

## 2017-04-17 LAB — CEA (IN HOUSE-CHCC): CEA (CHCC-IN HOUSE): 11.14 ng/mL — AB (ref 0.00–5.00)

## 2017-04-17 MED ORDER — SODIUM CHLORIDE 0.9 % IV SOLN
2400.0000 mg/m2 | INTRAVENOUS | Status: DC
  Administered 2017-04-17: 3950 mg via INTRAVENOUS
  Filled 2017-04-17: qty 79

## 2017-04-17 MED ORDER — PROCHLORPERAZINE MALEATE 10 MG PO TABS
10.0000 mg | ORAL_TABLET | Freq: Once | ORAL | Status: AC
  Administered 2017-04-17: 10 mg via ORAL

## 2017-04-17 MED ORDER — SODIUM CHLORIDE 0.9% FLUSH
10.0000 mL | Freq: Once | INTRAVENOUS | Status: AC
  Administered 2017-04-17: 10 mL
  Filled 2017-04-17: qty 10

## 2017-04-17 MED ORDER — PROCHLORPERAZINE MALEATE 10 MG PO TABS
ORAL_TABLET | ORAL | Status: AC
  Filled 2017-04-17: qty 1

## 2017-04-17 NOTE — Patient Instructions (Signed)
[facility] Discharge Instructions for Patients Receiving Chemotherapy  Today you received the following chemotherapy agents: Adrucil   To help prevent nausea and vomiting after your treatment, we encourage you to take your nausea medication as directed.    If you develop nausea and vomiting that is not controlled by your nausea medication, call the clinic.   BELOW ARE SYMPTOMS THAT SHOULD BE REPORTED IMMEDIATELY:  *FEVER GREATER THAN 100.5 F  *CHILLS WITH OR WITHOUT FEVER  NAUSEA AND VOMITING THAT IS NOT CONTROLLED WITH YOUR NAUSEA MEDICATION  *UNUSUAL SHORTNESS OF BREATH  *UNUSUAL BRUISING OR BLEEDING  TENDERNESS IN MOUTH AND THROAT WITH OR WITHOUT PRESENCE OF ULCERS  *URINARY PROBLEMS  *BOWEL PROBLEMS  UNUSUAL RASH Items with * indicate a potential emergency and should be followed up as soon as possible.  Feel free to call the clinic you have any questions or concerns. The clinic phone number is [phone].  Please show the CHEMO ALERT CARD at check-in to the Emergency Department and triage nurse.   

## 2017-04-17 NOTE — Telephone Encounter (Signed)
Spoke with patient concerning appoint changes. Per 9/26 messages

## 2017-04-17 NOTE — Progress Notes (Signed)
Nutrition follow-up completed with patient during treatment for esophageal cancer. Weight has decreased and was documented as 131.7 pounds on September 12 decreased from 134.3 pounds August 15. Patient was on a vacation and did not use his tube feeding during that time.  He attributes recent weight loss to not using his tube during vacation. Denies other nutrition impact symptoms. He has resumed nocturnal tube feedings using Nutren 1.5 via jejunostomy tube in order to regain weight.  Nutrition diagnosis: Unintentional weight loss continues.  Intervention: Educated patient to continue oral intake as tolerated and nocturnal tube feeding to meet greater than 90% of estimated nutrition needs.  Monitoring, evaluation, goals: Patient will minimize further weight loss.  Next visit: To be scheduled as needed.  **Disclaimer: This note was dictated with voice recognition software. Similar sounding words can inadvertently be transcribed and this note may contain transcription errors which may not have been corrected upon publication of note.**

## 2017-04-19 ENCOUNTER — Ambulatory Visit (HOSPITAL_BASED_OUTPATIENT_CLINIC_OR_DEPARTMENT_OTHER): Payer: Non-veteran care

## 2017-04-19 VITALS — BP 111/57 | HR 94 | Temp 98.9°F | Resp 18

## 2017-04-19 DIAGNOSIS — C155 Malignant neoplasm of lower third of esophagus: Secondary | ICD-10-CM

## 2017-04-19 MED ORDER — HEPARIN SOD (PORK) LOCK FLUSH 100 UNIT/ML IV SOLN
500.0000 [IU] | Freq: Once | INTRAVENOUS | Status: AC | PRN
  Administered 2017-04-19: 500 [IU]
  Filled 2017-04-19: qty 5

## 2017-04-19 MED ORDER — SODIUM CHLORIDE 0.9% FLUSH
10.0000 mL | INTRAVENOUS | Status: DC | PRN
  Administered 2017-04-19: 10 mL
  Filled 2017-04-19: qty 10

## 2017-04-19 NOTE — Patient Instructions (Signed)
Implanted Port Home Guide An implanted port is a type of central line that is placed under the skin. Central lines are used to provide IV access when treatment or nutrition needs to be given through a person's veins. Implanted ports are used for long-term IV access. An implanted port may be placed because:  You need IV medicine that would be irritating to the small veins in your hands or arms.  You need long-term IV medicines, such as antibiotics.  You need IV nutrition for a long period.  You need frequent blood draws for lab tests.  You need dialysis.  Implanted ports are usually placed in the chest area, but they can also be placed in the upper arm, the abdomen, or the leg. An implanted port has two main parts:  Reservoir. The reservoir is round and will appear as a small, raised area under your skin. The reservoir is the part where a needle is inserted to give medicines or draw blood.  Catheter. The catheter is a thin, flexible tube that extends from the reservoir. The catheter is placed into a large vein. Medicine that is inserted into the reservoir goes into the catheter and then into the vein.  How will I care for my incision site? Do not get the incision site wet. Bathe or shower as directed by your health care provider. How is my port accessed? Special steps must be taken to access the port:  Before the port is accessed, a numbing cream can be placed on the skin. This helps numb the skin over the port site.  Your health care provider uses a sterile technique to access the port. ? Your health care provider must put on a mask and sterile gloves. ? The skin over your port is cleaned carefully with an antiseptic and allowed to dry. ? The port is gently pinched between sterile gloves, and a needle is inserted into the port.  Only "non-coring" port needles should be used to access the port. Once the port is accessed, a blood return should be checked. This helps ensure that the port  is in the vein and is not clogged.  If your port needs to remain accessed for a constant infusion, a clear (transparent) bandage will be placed over the needle site. The bandage and needle will need to be changed every week, or as directed by your health care provider.  Keep the bandage covering the needle clean and dry. Do not get it wet. Follow your health care provider's instructions on how to take a shower or bath while the port is accessed.  If your port does not need to stay accessed, no bandage is needed over the port.  What is flushing? Flushing helps keep the port from getting clogged. Follow your health care provider's instructions on how and when to flush the port. Ports are usually flushed with saline solution or a medicine called heparin. The need for flushing will depend on how the port is used.  If the port is used for intermittent medicines or blood draws, the port will need to be flushed: ? After medicines have been given. ? After blood has been drawn. ? As part of routine maintenance.  If a constant infusion is running, the port may not need to be flushed.  How long will my port stay implanted? The port can stay in for as long as your health care provider thinks it is needed. When it is time for the port to come out, surgery will be   done to remove it. The procedure is similar to the one performed when the port was put in. When should I seek immediate medical care? When you have an implanted port, you should seek immediate medical care if:  You notice a bad smell coming from the incision site.  You have swelling, redness, or drainage at the incision site.  You have more swelling or pain at the port site or the surrounding area.  You have a fever that is not controlled with medicine.  This information is not intended to replace advice given to you by your health care provider. Make sure you discuss any questions you have with your health care provider. Document  Released: 07/09/2005 Document Revised: 12/15/2015 Document Reviewed: 03/16/2013 [employer] Interactive Patient Education  2017 [employer]  

## 2017-04-30 NOTE — Progress Notes (Signed)
[place]  [phone] [phone]  Clinic Follow up Note   Patient Care Team: [name], MD as PCP - General (Internal Medicine) Truitt Merle, MD as Consulting Physician (Hematology) Kyung Rudd, MD as Consulting Physician (Radiation Oncology) [name], MD as Consulting Physician (Cardiothoracic Surgery) Karie Mainland, RD as Dietitian (Nutrition) Minus Breeding, MD as Consulting Physician (Cardiology)   CHIEF COMPLAINTS:  Follow up metastatic esophageal cancer    Oncology History   Presented with solid dysphagia at least daily with chest pain. Involuntary weight loss of 10-12 lb over past year  Malignant neoplasm of lower third of esophagus (HCC)   Staging form: Esophagus - Adenocarcinoma, AJCC 7th Edition   - Clinical stage from 12/15/2015: Stage IIIA (T3, N1, M0) - Signed by Truitt Merle, MD on 12/24/2015   - Pathologic stage from 04/26/2016: yT0, N0, cM0, GX - Signed by [name], MD on 04/27/2016      Malignant neoplasm of lower third of esophagus ([place])   11/23/2015 Procedure    EGD per [facility](Dr. Toledo):6cm mass in lower third of esophagus      11/24/2015 Initial Diagnosis    Esophageal cancer ([place])      11/24/2015 Pathology Results    Mucinous adenocarcinoma, moderately differentiated-Her2 analysis pending      12/05/2015 Imaging    PET scan showed hypermetabolic a mass in distal esophagus, hypermetabolic activity in the left anterior prostate gland, metabolic density in left adrenal nodule, CT attenuation suggest a benign adrenal adenoma. Left apical 2 mm lung nodule.      12/15/2015 Procedure    EUS showed a uT3N1 lesion from 27cm to 35cm from the incisors       12/26/2015 - 02/02/2016 Chemotherapy    Weekly carboplatin AUC 2 and Taxol 45 mg/m, with concurrent radiation      12/26/2015 - 02/02/2016 Radiation Therapy    Neoadjuvant chemoradiation to the esophageal cancer      04/23/2016  Surgery    total esophagectomy with cervical esophagogastrostomy, pyloroplasty, feeding jejunostomy       04/23/2016 Pathology Results    Esophagectomy showed no residual tumor, 8 lymph nodes were all negative, incidental leiomyoma 0.2 cm      08/14/2016 PET scan    IMPRESSION: 1. Small hypermetabolic left adrenal mass, this has not changed in size compared to 12/01/15 but has a maximum standard uptake value of 6.2 (previously 4.2). Indeterminate density and MRI characteristics, not specific for adenoma. Early metastatic disease is not readily excluded although the lack of change of size is somewhat reassuring. Surveillance recommended. 2. No other hypermetabolic lesions are identified. 3. Moderate to large left pleural effusion with passive atelectasis. 4. Esophagectomy with gastric pull-through. 5. No hypermetabolic liver lesions are seen. 6. Enlarged prostate gland.      08/16/2016 - 08/23/2016 Hospital Admission    He was diagnosed with DIC, unclear etiology. He was given blood products. He underwent echocardiogram which was negative for endocarditis, CTA chest/abd which was negative for aneurysm or dissection as work up for DIC. He was on prednisone for some time, but stopped as it was not helping much for DIC.       08/28/2016 - 09/01/2016 Hospital Admission    He has had issues with recurrent L sided pleural effusions, DIC and melena. He was last admitted in 1/25, given supportive care with a thoracentesis and blood transfusion. Dr Burr Medico has been following DIC and has requested a direct admit for melena,  anemia and thrombocytopenia. GI called for EGD.        08/31/2016 Pathology Results    Bone Marrow Biopsy, right iliac - METASTATIC ADENOCARCINOMA. - SEE COMMENT. PERIPHERAL BLOOD: - NORMOCYTIC ANEMIA. - THROMBOCYTOPENIA.      08/31/2016 Progression    Bone marrow biopsy showed metastatic adenocarcinoma      09/04/2016 -  Chemotherapy    FOLFOX every 2 weeks; changed to  maintenance 5-FU every 2 weeks starting 02/06/17      09/19/2016 Procedure    Therapeutic thoracentesis of a left pleural effusion performed on 09/19/16 yielded 1.5 liters of blood-tinged fluid. Reactive appearing mesothelial cells were present.      11/12/2016 PET scan     IMPRESSION: 1. Heterogeneous bony uptake with several mildly avid foci primarily in the lower thoracic spine, lumbar spine, and pelvic bones consistent with known bony metastatic disease. 2. There is new uptake in the right adrenal gland not seen previously with no CT correlate. Recommend attention on follow-up. 3. A rounded focus of uptake in the anterior peripheral left prostate is larger in the interval. The findings are nonspecific and could be infectious, inflammatory, or neoplastic. Focal prostatitis or prostate cancer are most likely. 4. The uptake in the left adrenal gland has decreased in the interval. 5. Mild uptake in the bilateral hila may simply represent reactive nodes. Recommend attention on follow-up. 6. The left-sided pleural effusion is a little larger in the interval with loculated components.      02/20/2017 PET scan    PET 02/20/17 IMPRESSION: 1. No activity suspicious for metastatic adenocarcinoma status post esophagectomy and gastric pull-through. 2. Stable mild prominence of the left adrenal gland and associated hypermetabolic activity from several prior studies. The lack of change implies a benign etiology. No abnormal activity within the right adrenal gland. 3. Persistent focal activity peripherally in the left lobe of the prostate, unchanged from the most recent study. Appearance is nonspecific, and could reflect prostate cancer or a benign nodule. Focal prostatitis less likely given persistence. 4. Stable loculated left pleural effusion and left basilar atelectasis. 5. These results were called by telephone at the time of interpretation on 02/20/2017 at 1:47 pm to Dr. Truitt Merle , who  verbally acknowledged these results.       HISTORY OF PRESENTING ILLNESS (12/14/2015):  Fred [name] 76 y.o. male is here because of His newly diagnosed esophageal cancer. He is accompanied by his wife and daughter to my clinic today.  He has had dysphagia for 2 months, mainly with solid food, no dysphagia with soft food or liquid. He also has mild pain in mid chest when he swallows. No nausea, abdominal pain, or other discomfort. He has lost about 15 lbs in the last year, no other symoptoms. He was seen by his primary care physician at [facility], and referred to gastroenterologist Dr. Alice Reichert. He underwent EGD on 11/23/2015, which showed a 6 cm mass in the lower third of esophagus. Biopsy showed adenocarcinoma, moderately differentiated. He was referred to [place] to consider neoadjuvant therapy. He is scheduled to see a Psychologist, sport and exercise at [facility] later this week.  He has had some urinary frequency, underwent TURP on 11/02/2015.   CURRENT THERAPY: first line chemo FOLFOX every 2 weeks started on 09/04/2016; changed to maintenance 5-FU every 2 weeks starting 02/06/17.  INTERIM HISTORY:  Mr. Shugart returns for follow up and treatment. He presents to the clinic today reporting still having numbness and tingling in his hands. He is able  to function with his fine motor skills, it just takes longer. His toes are tingling but he is bale to walk normally. He feels the same after changing to maintenance 5-FU.  He eats 3-4 cans a night. He had flu shot last week at [place]. He notes to having fever before flu shot but resolved before getting it. He ambulates with a cane but is still able to drive himself.     MEDICAL HISTORY:  Past Medical History:  Diagnosis Date  . Esophageal cancer ([place]) 11/23/15   lower 3rd esohagus   . GERD (gastroesophageal reflux disease)   . Hyperlipidemia   . Hypertension     SURGICAL HISTORY: Past Surgical History:  Procedure Laterality Date  . CHEST TUBE INSERTION Left 04/23/2016    Procedure: CHEST TUBE INSERTION;  Surgeon: [name], MD;  Location: [facility];  Service: Thoracic;  Laterality: Left;  . COLONOSCOPY N/A 08/30/2016   Procedure: COLONOSCOPY;  Surgeon: Milus Banister, MD;  Location: [facility];  Service: Endoscopy;  Laterality: N/A;  . COMPLETE ESOPHAGECTOMY N/A 04/23/2016   Procedure: TRANSHIATIAL TOTAL ESOPHAGECTOMY COMPLETE; CERVICAL ESOPHAGOGASTROSTOMY AND PYLOROPLASTY;  Surgeon: [name], MD;  Location: [facility];  Service: Thoracic;  Laterality: N/A;  . cystoscope  4/12/1   prostate  . ERCP    . ESOPHAGOGASTRODUODENOSCOPY (EGD) WITH PROPOFOL N/A 08/29/2016   Procedure: ESOPHAGOGASTRODUODENOSCOPY (EGD) WITH PROPOFOL;  Surgeon: Milus Banister, MD;  Location: [facility];  Service: Endoscopy;  Laterality: N/A;  . INGUINAL HERNIA REPAIR    . IR GENERIC HISTORICAL  08/31/2016   IR FLUORO GUIDE PORT INSERTION RIGHT 08/31/2016 Corrie Mckusick, DO [facility]  . IR GENERIC HISTORICAL  08/31/2016   IR US GUIDE VASC ACCESS RIGHT 08/31/2016 Corrie Mckusick, DO [facility]  . JEJUNOSTOMY N/A 04/23/2016   Procedure: FEEDING JEJUNOSTOMY;  Surgeon: [name], MD;  Location: [facility];  Service: Thoracic;  Laterality: N/A;  . LEG SURGERY Left    hole  . PROSTATE BIOPSY  10/05/15  . right shoulder surgery    . VIDEO BRONCHOSCOPY N/A 04/23/2016   Procedure: VIDEO BRONCHOSCOPY;  Surgeon: [name], MD;  Location: [facility] OR;  Service: Thoracic;  Laterality: N/A;    SOCIAL HISTORY: Social History   Social History  . Marital status: Married    Spouse name: N/A  . Number of children: 1  . Years of education: N/A   Occupational History  . Truck Geophysicist/field seismologist    Social History Main Topics  . Smoking status: Former Smoker    Packs/day: 1.00    Years: 10.00    Quit date: 07/24/1967  . Smokeless tobacco: Never Used  . Alcohol use No  . Drug use: No  . Sexual activity: Not Currently   Other Topics Concern  . Not on file   Social History Narrative  . No  narrative on file    FAMILY HISTORY: Family History  Problem Relation Age of Onset  . Cancer Maternal Grandfather 49       gastric cancer   . Alzheimer's disease Mother   . Diabetes Mother   . Stroke Father 82  . Multiple sclerosis Sister     ALLERGIES:  is allergic to no known allergies.  MEDICATIONS:  Current Outpatient Prescriptions  Medication Sig Dispense Refill  . acetaminophen (TYLENOL) 325 MG tablet Take 650 mg by mouth every 6 (six) hours as needed.    Marland Kitchen atenolol (TENORMIN) 25 MG tablet Take 1 tab in the morning and  half tab in the evening (Patient taking differently: Take 12.5-25 mg by mouth 2 (two) times daily. Take '25mg'$  in the morning and 12.'5mg'$  in the evening) 60 tablet 1  . folic acid (FOLVITE) 1 MG tablet Take 1 tablet (1 mg total) by mouth daily. 30 tablet 0  . lidocaine-prilocaine (EMLA) cream Apply 1 application topically as needed. Apply 1-2 tsp over port site 1 hour prior to chemotherapy 30 g 1  . mirtazapine (REMERON) 15 MG tablet Take 1 tablet (15 mg total) by mouth at bedtime. 30 tablet 2  . Nutritional Supplements (NUTREN 1.5) LIQD 250 mLs by Jejunal Tube route at bedtime. 3 CANS VIA FEEDING TUBE    . ondansetron (ZOFRAN ODT) 8 MG disintegrating tablet Take 1 tablet (8 mg total) by mouth every 8 (eight) hours as needed for nausea or vomiting. 30 tablet 2  . oxycodone (OXY-IR) 5 MG capsule Take 5 mg by mouth every 6 (six) hours as needed for pain.     Marland Kitchen prochlorperazine (COMPAZINE) 10 MG tablet Take 1 tablet (10 mg total) by mouth every 6 (six) hours as needed for nausea or vomiting. 30 tablet 3  . vitamin B-12 1000 MCG tablet Take 1 tablet (1,000 mcg total) by mouth daily. 30 tablet 0   No current facility-administered medications for this visit.     REVIEW OF SYSTEMS:   Constitutional: Denies fevers, chills or abnormal night sweats   (+) Weight stable Eyes: Denies blurriness of vision, double vision or watery eyes Ears, nose, mouth, throat, and face:  Denies mucositis or sore throat  Respiratory: Denies cough, dyspnea or wheezes Cardiovascular: Denies palpitation, chest discomfort or lower extremity swelling Gastrointestinal:  Denies heartburn or change in bowel habits Skin: Denies abnormal skin rashes Lymphatics: Denies new lymphadenopathy or easy bruising Neurological:Denies numbness, tingling or new weaknesses (+) Neuropathy, improved Behavioral/Psych: Mood is stable, no new changes  Musculoskeletal: (+) ambulates with cane All other systems were reviewed with the patient and are negative.  PHYSICAL EXAMINATION:  ECOG PERFORMANCE STATUS: 2 Vitals:   05/01/17 1203  BP: 114/67  Pulse: 89  Resp: 18  Temp: 98.7 F (37.1 C)  TempSrc: Oral  SpO2: 100%  Weight: 133 lb 6.4 oz (60.5 kg)  Height: '5\' 5"'$  (1.651 m)    GENERAL:alert, no distress and comfortable, in wheelchair SKIN: Appears to be pale, skin texture, turgor are normal, no rashes or significant lesions EYES: normal, conjunctiva are pink and non-injected, sclera clear OROPHARYNX:no exudate, no erythema and lips, buccal mucosa, and tongue normal  NECK: supple, thyroid normal size, non-tender, without nodularity LYMPH:  no palpable lymphadenopathy in the cervical, axillary or inguinal LUNGS: clear to auscultation and percussion with normal breathing effort HEART: regular rate & rhythm and no murmurs and no lower extremity edema ABDOMEN:abdomen soft, non-tender and normal bowel sounds, J tube in place. No skin erythema or pus. (+) Feeding tube site with old blood around feeding tube site, no discharge. No skin erythema around J tube. Normal mucosa  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  LABORATORY DATA:  I have reviewed the data as listed CBC Latest Ref Rng & Units 05/01/2017 04/17/2017 04/03/2017  WBC 4.0 - 10.3 10e3/uL 7.0 6.1 6.0  Hemoglobin 13.0  - 17.1 g/dL 12.3(L) 12.7(L) 12.7(L)  Hematocrit 38.4 - 49.9 % 36.1(L) 36.7(L) 37.3(L)  Platelets 140 - 400 10e3/uL 246 174 184  CMP Latest Ref Rng & Units 05/01/2017 04/17/2017 04/03/2017  Glucose 70 - 140 mg/dl 101 101 113  BUN 7.0 - 26.0 mg/dL 10.5 8.2 9.5  Creatinine 0.7 - 1.3 mg/dL 0.6(L) 0.6(L) 0.6(L)  Sodium 136 - 145 mEq/L 140 138 138  Potassium 3.5 - 5.1 mEq/L 4.2 4.2 4.3  Chloride 101 - 111 mmol/L - - -  CO2 22 - 29 mEq/L '25 25 25  '$ Calcium 8.4 - 10.4 mg/dL 9.1 9.3 9.4  Total Protein 6.4 - 8.3 g/dL 6.3(L) 6.5 6.5  Total Bilirubin 0.20 - 1.20 mg/dL 0.35 0.41 0.39  Alkaline Phos 40 - 150 U/L 153(H) 150 132  AST 5 - 34 U/L '24 20 16  '$ ALT 0 - 55 U/L '19 11 8    '$ Results for THOAMS, SIEFERT (MRN [id number]) as of 04/30/2017 16:56  Ref. Range 02/06/2017 11:19 02/20/2017 12:10 03/20/2017 09:42 04/17/2017 09:24  CEA ([facility]) Latest Ref Range: 0.00 - 5.00 ng/mL 3.11 2.89 3.37 11.14 (H)     Pathology report [facility] TESTING 09/29/2016   PD-L1 Testing 09/29/2016   Diagnosis 09/27/2016 Consult- Comprehensive, [id number] - 1A Mass, Lower third of Esophagus, Mucosal Biopsy - ADENOCARCINOMA WITH EXTRACELLULAR MUCIN. - SEE COMMENT. Microscopic Comment Provided Her2 IHC with the appropriate controls is negative. Per report Her2 FISH is also negative. There is limited tissue remaining, but [facility] and PDL-1 testing will be attempted as requested.  Diagnosis 09/19/2016 PLEURAL FLUID, LEFT (SPECIMEN 1 OF 1 COLLECTED 09/19/16): REACTIVE APPEARING MESOTHELIAL CELLS PRESENT.  Diagnosis 08/31/2016 Bone Marrow Biopsy, right iliac - METASTATIC ADENOCARCINOMA. - SEE COMMENT. PERIPHERAL BLOOD: - NORMOCYTIC ANEMIA. - THROMBOCYTOPENIA.  Diagnosis 08/02/16 PLEURAL FLUID, LEFT (SPECIMEN 1 OF 1 COLLECTED 08/02/16): REACTIVE MESOTHELIAL CELLS PRESENT. LYMPHOCYTES PRESENT.  Diagnosis 11/23/2015 Mass, lower third of the esophagus, mucosal biopsy Mucinous adenocarcinoma, moderately  differentiated.  Diagnosis 04/23/2016 1. Lymph node, biopsy, Cervical - ONE OF ONE LYMPH NODES NEGATIVE FOR CARCINOMA (0/1). 2. Lymph node, biopsy, Periesophageal - BLOOD WITH SCANT BENIGN SOFT TISSUE AND SKELETAL MUSCLE. - NO MALIGNANCY IDENTIFIED. 3. Esophagogastrectomy - ULCERATION WITH INFLAMMATION AND REACTIVE CHANGES. - FOCAL INTESTINAL METAPLASIA. - CHANGES CONSISTENT WITH TREATMENT EFFECT. - SEVEN OF SEVEN LYMPH NODES NEGATIVE FOR CARCINOMA (0/7). - INCIDENTAL LEIOMYOMA, 0.2 CM. - MARGINS ARE NEGATIVE FOR DYSPLASIA OR MALIGNANCY. - SEE ONCOLOGY TABLE. Microscopic Comment 3. ESOPHAGUS: Specimen: Esophagus and proximal stomach, cervical lymph node. Procedure: Esophagogastrectomy and cervical lymph node biopsy. Tumor Site: Presumed GE junction, see comment. Relationship of Tumor to esophagogastric junction: Can not be assessed. Distance of tumor center from esophagogastric junction: Can not be assessed. Tumor Size Greatest dimension: No residual tumor present. Histologic Type: Per medical record adenocarcinoma (no biopsy for review). Histologic Grade: N/A. Microscopic Tumor Extension: N/A. Margins: Negative. Treatment Effect: Present (TRS 0). Lymph-Vascular Invasion: Not identified. Perineural Invasion: Not identified. Lymph nodes: number examined 8; number positive: 0 TNM: ypT0, ypN0 see comment. 1 of 3 FINAL for CRANSTON, KOORS ([id number]) Microscopic Comment(continued) Ancillary studies: None. Comments: The entire GE junction is submitted for evaluation. There is ulceration with associated inflammation. There is acellular mucin/myxoid changes which extend through the muscularis propria, but no viable/residual tumor is identified and thus the stage is ypT0. There is focal background intestinal metaplasia, which may have been pre-existing (Barrett's esophagus) or related to therapy.   RADIOGRAPHIC STUDIES: I have personally reviewed the radiological images as listed  and agreed with the findings in the report. Dg Esophagus  Result Date: 04/04/2017 CLINICAL DATA:  History of  resection of esophageal carcinoma with gastric pull-through, evaluate for possible stricture EXAM: ESOPHOGRAM/BARIUM SWALLOW TECHNIQUE: Single contrast examination was performed using  thin barium. FLUOROSCOPY TIME:  Fluoroscopy Time:  1 minutes 24 seconds Radiation Exposure Index (if provided by the fluoroscopic device): 246 mGy Number of Acquired Spot Images: 0 COMPARISON:  Postop esophagram of 04/30/2016 FINDINGS: Initially rapid sequence spot films of the cervical esophagus were performed. There is some prominence of the cricopharyngeus muscle. The lower cervical esophagus is narrowed at this level. However the anastomosis of lower cervical esophagus with gastric pull-through appears patent. The gastric pull-through is unremarkable. There is some delay in passage of barium in to the upper abdomen which is within normal limits. A barium pill was given at the end of the study which passed into the upper abdomen without significant delay. IMPRESSION: 1. Prominence of the cricopharyngeus muscle. The lower cervical esophagus is somewhat narrowed at this level. 2. The anastomotic site of the lower cervical esophagus with gastric pull-through appears relatively patent. 3. No delay in passage barium pill into the upper abdomen. Electronically Signed   By: Ivar Drape M.D.   On: 04/04/2017 11:19   PET 08/14/2016 IMPRESSION: 1. Small hypermetabolic left adrenal mass, this has not changed in size compared to 12/01/15 but has a maximum standard uptake value of 6.2 (previously 4.2). Indeterminate density and MRI characteristics, not specific for adenoma. Early metastatic disease is not readily excluded although the lack of change of size is somewhat reassuring. Surveillance recommended. 2. No other hypermetabolic lesions are identified. 3. Moderate to large left pleural effusion with passive  atelectasis. 4. Esophagectomy with gastric pull-through. 5. No hypermetabolic liver lesions are seen. 6. Enlarged prostate gland.  CT angio chest/abdomen/pelvis for dissection w/wo contrast 08/22/2016 IMPRESSION: No evidence of aortic aneurysm or dissection. No evidence of pulmonary embolus. Heart is borderline in size. Stable small left adrenal nodule which was hypermetabolic on prior PET CT. Stable large left pleural effusion with compressive atelectasis in the left lower lobe. Stable appearance of the gastric pull-through post esophagectomy.  US Thoracentesis asp pleural space 09/19/2016 IMPRESSION: Successful ultrasound guided diagnostic and therapeutic left thoracentesis yielding 1.5 liters of pleural fluid.  PET 11/12/2016 IMPRESSION: 1. Heterogeneous bony uptake with several mildly avid foci primarily in the lower thoracic spine, lumbar spine, and pelvic bones consistent with known bony metastatic disease. 2. There is new uptake in the right adrenal gland not seen previously with no CT correlate. Recommend attention on follow-up. 3. A rounded focus of uptake in the anterior peripheral left prostate is larger in the interval. The findings are nonspecific and could be infectious, inflammatory, or neoplastic. Focal prostatitis or prostate cancer are most likely. 4. The uptake in the left adrenal gland has decreased in the interval. 5. Mild uptake in the bilateral hila may simply represent reactive nodes. Recommend attention on follow-up. 6. The left-sided pleural effusion is a little larger in the interval with loculated components.    ASSESSMENT & PLAN:  76 y.o. male presented with dysphagia with solid food  1. Distal esophageal adenocarcinoma, uT3N1M0, stage IIIA, ypT0N0M0, diffuse bone metastasis 08/2016, HER2(-), PD-L1 (-) -I previously reviewed his CT scan, PET scan, EGD, and the biopsy findings with patient and his family member in details. -The initial PET scan  showed no definitive distant metastasis. 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 This will be followed in the future scan. -By EUS, he had T3 N1 stage III disease. -  He completed neoadjuvant radiation with weekly Carbo and Taxol. -I previously reviewed his surgical pathology findings, he has had complete pathologic response to neoadjuvant chemotherapy and radiation, which predicts good prognosis -Unfortunately he developed diffuse bone metastasis in January 2018, confirmed by bone marrow biopsy -He has started first-line chemotherapy FOLFOX, tolerating well, and his anemia and thrombocytopenia has previously improved/resolved since chemotherapy -The goal of therapy is palliative -his tumor was negative for HER2 and PD-L1 expression and MSI-stable,  he is not a candidate for immunotherapy or anti-her2 therapy  -I previously reviewed his [facility] genomic testing results, unfortunately there is no good targeted therapy available. His tumor has MSI stable -He has clinically been doing well overall, tolerating chemo very well  -CEA has decreased significant since starting FOLFOX in February 2018. - restaging PET Scan taken 11/12/16 were reviewed with pt and his family.  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 -We discussed the role of bone  marrow biopsy to see if he has achieved complete response, I would only consider this if we plan to stop chemo. We discussed the incurable nature of his metastatic cancer, I'll hold bone marrow biopsy at this point. If he is clinically doing well, with stable CBC and no evidence of DIC, will continue 5-FU maintenance therapy -Labs reviewed and his last tumor marker has recently elevated from normal to 11.14 since stopping FOLFOX. I suggest we may have to change his treatment if his CEA continue to increase. Other labs reviewed and adequate to proceed with maintenance 5-FU today  -He had flu shot last week through the [place].  -f/u in 4 weeks with PET scan    2. 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 thrombocytopenia has resolved. -The patient had many blood transfusions with the last on 09/28/16, he has not required any blood transfusion since he started chemotherapy. -Continue monitoring CBC -Plt remain normal and stable, slight anemia stable at 12.3 as of 05/01/17  3. 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  -He has had 3 repeated thoracentesis, all cytology were negative, -Therapeutic thoracentesis performed on 09/19/16 yielded 1.5 liters of blood-tinged fluid. Reactive appearing mesothelial cells were present. -He still has small residual left pleural effusion, overall stable, -Continue monitoring, previously repeated CXR -follow-up with Dr. Tyrone Sage  4. HTN -Continue medication and follow-up of his primary care physician -his PCP at [facility] has changed his BP meds lately   5. Malnutrition  -Doing better, he is tolerating J-tube feeding very well, he eats normally. Encouraged the patient to eat small frequent meals. -he has decreased his tube feeds lately  -Follow up with dietitian -Feeding tube site is clean and no bleeding lately  -Weight has been stable lately, he  takes 3-4 cans a day in addition to eating.   6. Anorexia and depression  -continue mirtazapine. Overall, previously improved lately  -He hasn't talked to the nutritionist  in a while, he will follow up   7. Fatigue  -He would like something to help his energy. -We previously discussed steroids to help for this, but I told them that this is not a long term solution -He would not like steroids at this time.  -We previously discussed fatigue being related to chemo or anemia.  -I previously encouraged him to eat well with protein and be more active   8. Goal of care discussion  -We previously discussed the incurable nature of his cancer, and the overall poor prognosis, especially if he does not have good response to chemotherapy or progress on chemo -The patient understands the goal of care is palliative. -I recommend DNR/DNI, he will think about it   9. 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  -He has been off Oxaliplatin for 2 months now -His numbness has increased further up his hands. He denies any tingling or pain. If this progresses further I may stop 5FU -residual neuropathy still present   Plan -Lab reviewed, adequate for treatment, we'll proceed maintenance 5-FU today and continue every 2 weeks -Lab, flush, and 5-fu in 2, 4, and 6 weeks  -PET a few days before next office visit in 4 weeks    All questions were answered. The patient knows to call the clinic with any problems, questions or concerns.  I spent 20 minutes counseling the patient face to face. The total time spent in the appointment was 25 minutes and more than 50% was on  counseling.   Truitt Merle, MD 05/01/2017   This document serves as a record of services personally performed by Truitt Merle, MD. It was created on her behalf by Joslyn Devon, a trained medical scribe. The creation of this record is based on the scribe's personal observations and the provider's statements to them. This document has been checked and approved by the attending provider.

## 2017-05-01 ENCOUNTER — Ambulatory Visit (HOSPITAL_BASED_OUTPATIENT_CLINIC_OR_DEPARTMENT_OTHER): Payer: Non-veteran care

## 2017-05-01 ENCOUNTER — Other Ambulatory Visit (HOSPITAL_BASED_OUTPATIENT_CLINIC_OR_DEPARTMENT_OTHER): Payer: Non-veteran care

## 2017-05-01 ENCOUNTER — Ambulatory Visit: Payer: PRIVATE HEALTH INSURANCE

## 2017-05-01 ENCOUNTER — Encounter: Payer: Self-pay | Admitting: Hematology

## 2017-05-01 ENCOUNTER — Telehealth: Payer: Self-pay | Admitting: Hematology

## 2017-05-01 ENCOUNTER — Ambulatory Visit (HOSPITAL_BASED_OUTPATIENT_CLINIC_OR_DEPARTMENT_OTHER): Payer: Non-veteran care | Admitting: Hematology

## 2017-05-01 VITALS — BP 114/67 | HR 89 | Temp 98.7°F | Resp 18 | Ht 65.0 in | Wt 133.4 lb

## 2017-05-01 DIAGNOSIS — C155 Malignant neoplasm of lower third of esophagus: Secondary | ICD-10-CM

## 2017-05-01 DIAGNOSIS — Z95828 Presence of other vascular implants and grafts: Secondary | ICD-10-CM

## 2017-05-01 DIAGNOSIS — E440 Moderate protein-calorie malnutrition: Secondary | ICD-10-CM

## 2017-05-01 DIAGNOSIS — Z5111 Encounter for antineoplastic chemotherapy: Secondary | ICD-10-CM

## 2017-05-01 DIAGNOSIS — C7951 Secondary malignant neoplasm of bone: Secondary | ICD-10-CM | POA: Diagnosis not present

## 2017-05-01 DIAGNOSIS — I10 Essential (primary) hypertension: Secondary | ICD-10-CM | POA: Diagnosis not present

## 2017-05-01 LAB — CBC & DIFF AND RETIC
BASO%: 0.1 % (ref 0.0–2.0)
BASOS ABS: 0 10*3/uL (ref 0.0–0.1)
EOS%: 0.6 % (ref 0.0–7.0)
Eosinophils Absolute: 0 10*3/uL (ref 0.0–0.5)
HEMATOCRIT: 36.1 % — AB (ref 38.4–49.9)
HGB: 12.3 g/dL — ABNORMAL LOW (ref 13.0–17.1)
Immature Retic Fract: 12.8 % — ABNORMAL HIGH (ref 3.00–10.60)
LYMPH%: 16.7 % (ref 14.0–49.0)
MCH: 32.7 pg (ref 27.2–33.4)
MCHC: 34.1 g/dL (ref 32.0–36.0)
MCV: 96 fL (ref 79.3–98.0)
MONO#: 0.7 10*3/uL (ref 0.1–0.9)
MONO%: 9.9 % (ref 0.0–14.0)
NEUT#: 5.1 10*3/uL (ref 1.5–6.5)
NEUT%: 72.7 % (ref 39.0–75.0)
PLATELETS: 246 10*3/uL (ref 140–400)
RBC: 3.76 10*6/uL — ABNORMAL LOW (ref 4.20–5.82)
RDW: 14.2 % (ref 11.0–14.6)
Retic %: 1.75 % (ref 0.80–1.80)
Retic Ct Abs: 65.8 10*3/uL (ref 34.80–93.90)
WBC: 7 10*3/uL (ref 4.0–10.3)
lymph#: 1.2 10*3/uL (ref 0.9–3.3)

## 2017-05-01 LAB — COMPREHENSIVE METABOLIC PANEL
ALBUMIN: 3.3 g/dL — AB (ref 3.5–5.0)
ALT: 19 U/L (ref 0–55)
AST: 24 U/L (ref 5–34)
Alkaline Phosphatase: 153 U/L — ABNORMAL HIGH (ref 40–150)
Anion Gap: 9 mEq/L (ref 3–11)
BILIRUBIN TOTAL: 0.35 mg/dL (ref 0.20–1.20)
BUN: 10.5 mg/dL (ref 7.0–26.0)
CO2: 25 meq/L (ref 22–29)
CREATININE: 0.6 mg/dL — AB (ref 0.7–1.3)
Calcium: 9.1 mg/dL (ref 8.4–10.4)
Chloride: 106 mEq/L (ref 98–109)
EGFR: >60 mL/min/{1.73_m2} (ref >60)
GLUCOSE: 101 mg/dL (ref 70–140)
Potassium: 4.2 mEq/L (ref 3.5–5.1)
SODIUM: 140 meq/L (ref 136–145)
TOTAL PROTEIN: 6.3 g/dL — AB (ref 6.4–8.3)

## 2017-05-01 MED ORDER — PROCHLORPERAZINE MALEATE 10 MG PO TABS
ORAL_TABLET | ORAL | Status: AC
  Filled 2017-05-01: qty 1

## 2017-05-01 MED ORDER — SODIUM CHLORIDE 0.9% FLUSH
10.0000 mL | Freq: Once | INTRAVENOUS | Status: AC
  Administered 2017-05-01: 10 mL
  Filled 2017-05-01: qty 10

## 2017-05-01 MED ORDER — SODIUM CHLORIDE 0.9% FLUSH
10.0000 mL | INTRAVENOUS | Status: DC | PRN
Start: 2017-05-01 — End: 2017-05-01
  Filled 2017-05-01: qty 10

## 2017-05-01 MED ORDER — SODIUM CHLORIDE 0.9 % IV SOLN
2400.0000 mg/m2 | INTRAVENOUS | Status: DC
  Administered 2017-05-01: 3950 mg via INTRAVENOUS
  Filled 2017-05-01: qty 79

## 2017-05-01 MED ORDER — PROCHLORPERAZINE MALEATE 10 MG PO TABS
10.0000 mg | ORAL_TABLET | Freq: Once | ORAL | Status: AC
  Administered 2017-05-01: 10 mg via ORAL

## 2017-05-01 MED ORDER — HEPARIN SOD (PORK) LOCK FLUSH 100 UNIT/ML IV SOLN
500.0000 [IU] | Freq: Once | INTRAVENOUS | Status: DC | PRN
  Filled 2017-05-01: qty 5

## 2017-05-01 NOTE — Telephone Encounter (Signed)
Scheduled appt per 10/10 los - Gave patient AVS and calender per los.  

## 2017-05-01 NOTE — Patient Instructions (Signed)
[facility] Discharge Instructions for Patients Receiving Chemotherapy  Today you received the following chemotherapy agents: Adrucil   To help prevent nausea and vomiting after your treatment, we encourage you to take your nausea medication as directed.    If you develop nausea and vomiting that is not controlled by your nausea medication, call the clinic.   BELOW ARE SYMPTOMS THAT SHOULD BE REPORTED IMMEDIATELY:  *FEVER GREATER THAN 100.5 F  *CHILLS WITH OR WITHOUT FEVER  NAUSEA AND VOMITING THAT IS NOT CONTROLLED WITH YOUR NAUSEA MEDICATION  *UNUSUAL SHORTNESS OF BREATH  *UNUSUAL BRUISING OR BLEEDING  TENDERNESS IN MOUTH AND THROAT WITH OR WITHOUT PRESENCE OF ULCERS  *URINARY PROBLEMS  *BOWEL PROBLEMS  UNUSUAL RASH Items with * indicate a potential emergency and should be followed up as soon as possible.  Feel free to call the clinic you have any questions or concerns. The clinic phone number is [phone].  Please show the CHEMO ALERT CARD at check-in to the Emergency Department and triage nurse.   

## 2017-05-01 NOTE — Telephone Encounter (Signed)
Left message for patient regarding appt per 10/10 sch msg

## 2017-05-03 ENCOUNTER — Ambulatory Visit (HOSPITAL_BASED_OUTPATIENT_CLINIC_OR_DEPARTMENT_OTHER): Payer: Non-veteran care

## 2017-05-03 VITALS — BP 114/59 | HR 90 | Temp 98.0°F | Resp 20

## 2017-05-03 DIAGNOSIS — C155 Malignant neoplasm of lower third of esophagus: Secondary | ICD-10-CM | POA: Diagnosis not present

## 2017-05-03 DIAGNOSIS — Z452 Encounter for adjustment and management of vascular access device: Secondary | ICD-10-CM | POA: Diagnosis not present

## 2017-05-03 MED ORDER — HEPARIN SOD (PORK) LOCK FLUSH 100 UNIT/ML IV SOLN
500.0000 [IU] | Freq: Once | INTRAVENOUS | Status: AC | PRN
  Administered 2017-05-03: 500 [IU]
  Filled 2017-05-03: qty 5

## 2017-05-03 MED ORDER — SODIUM CHLORIDE 0.9% FLUSH
10.0000 mL | INTRAVENOUS | Status: DC | PRN
  Administered 2017-05-03: 10 mL
  Filled 2017-05-03: qty 10

## 2017-05-03 NOTE — Patient Instructions (Signed)
Implanted Port Home Guide An implanted port is a type of central line that is placed under the skin. Central lines are used to provide IV access when treatment or nutrition needs to be given through a person's veins. Implanted ports are used for long-term IV access. An implanted port may be placed because:  You need IV medicine that would be irritating to the small veins in your hands or arms.  You need long-term IV medicines, such as antibiotics.  You need IV nutrition for a long period.  You need frequent blood draws for lab tests.  You need dialysis.  Implanted ports are usually placed in the chest area, but they can also be placed in the upper arm, the abdomen, or the leg. An implanted port has two main parts:  Reservoir. The reservoir is round and will appear as a small, raised area under your skin. The reservoir is the part where a needle is inserted to give medicines or draw blood.  Catheter. The catheter is a thin, flexible tube that extends from the reservoir. The catheter is placed into a large vein. Medicine that is inserted into the reservoir goes into the catheter and then into the vein.  How will I care for my incision site? Do not get the incision site wet. Bathe or shower as directed by your health care provider. How is my port accessed? Special steps must be taken to access the port:  Before the port is accessed, a numbing cream can be placed on the skin. This helps numb the skin over the port site.  Your health care provider uses a sterile technique to access the port. ? Your health care provider must put on a mask and sterile gloves. ? The skin over your port is cleaned carefully with an antiseptic and allowed to dry. ? The port is gently pinched between sterile gloves, and a needle is inserted into the port.  Only "non-coring" port needles should be used to access the port. Once the port is accessed, a blood return should be checked. This helps ensure that the port  is in the vein and is not clogged.  If your port needs to remain accessed for a constant infusion, a clear (transparent) bandage will be placed over the needle site. The bandage and needle will need to be changed every week, or as directed by your health care provider.  Keep the bandage covering the needle clean and dry. Do not get it wet. Follow your health care provider's instructions on how to take a shower or bath while the port is accessed.  If your port does not need to stay accessed, no bandage is needed over the port.  What is flushing? Flushing helps keep the port from getting clogged. Follow your health care provider's instructions on how and when to flush the port. Ports are usually flushed with saline solution or a medicine called heparin. The need for flushing will depend on how the port is used.  If the port is used for intermittent medicines or blood draws, the port will need to be flushed: ? After medicines have been given. ? After blood has been drawn. ? As part of routine maintenance.  If a constant infusion is running, the port may not need to be flushed.  How long will my port stay implanted? The port can stay in for as long as your health care provider thinks it is needed. When it is time for the port to come out, surgery will be   done to remove it. The procedure is similar to the one performed when the port was put in. When should I seek immediate medical care? When you have an implanted port, you should seek immediate medical care if:  You notice a bad smell coming from the incision site.  You have swelling, redness, or drainage at the incision site.  You have more swelling or pain at the port site or the surrounding area.  You have a fever that is not controlled with medicine.  This information is not intended to replace advice given to you by your health care provider. Make sure you discuss any questions you have with your health care provider. Document  Released: 07/09/2005 Document Revised: 12/15/2015 Document Reviewed: 03/16/2013 [employer] Interactive Patient Education  2017 [employer]  

## 2017-05-15 ENCOUNTER — Ambulatory Visit (HOSPITAL_BASED_OUTPATIENT_CLINIC_OR_DEPARTMENT_OTHER): Payer: Non-veteran care

## 2017-05-15 ENCOUNTER — Other Ambulatory Visit (HOSPITAL_BASED_OUTPATIENT_CLINIC_OR_DEPARTMENT_OTHER): Payer: Non-veteran care

## 2017-05-15 ENCOUNTER — Other Ambulatory Visit: Payer: Self-pay | Admitting: Hematology

## 2017-05-15 VITALS — BP 106/69 | HR 73 | Temp 97.9°F | Resp 18

## 2017-05-15 DIAGNOSIS — C7951 Secondary malignant neoplasm of bone: Secondary | ICD-10-CM

## 2017-05-15 DIAGNOSIS — C155 Malignant neoplasm of lower third of esophagus: Secondary | ICD-10-CM

## 2017-05-15 DIAGNOSIS — Z5111 Encounter for antineoplastic chemotherapy: Secondary | ICD-10-CM | POA: Diagnosis not present

## 2017-05-15 LAB — CBC & DIFF AND RETIC
BASO%: 0.3 % (ref 0.0–2.0)
BASOS ABS: 0 10*3/uL (ref 0.0–0.1)
EOS%: 1.5 % (ref 0.0–7.0)
Eosinophils Absolute: 0.1 10*3/uL (ref 0.0–0.5)
HEMATOCRIT: 35.1 % — AB (ref 38.4–49.9)
HGB: 11.9 g/dL — ABNORMAL LOW (ref 13.0–17.1)
Immature Retic Fract: 8 % (ref 3.00–10.60)
LYMPH%: 14.4 % (ref 14.0–49.0)
MCH: 32.6 pg (ref 27.2–33.4)
MCHC: 33.9 g/dL (ref 32.0–36.0)
MCV: 96.2 fL (ref 79.3–98.0)
MONO#: 0.9 10*3/uL (ref 0.1–0.9)
MONO%: 14.9 % — AB (ref 0.0–14.0)
NEUT#: 4.2 10*3/uL (ref 1.5–6.5)
NEUT%: 68.9 % (ref 39.0–75.0)
PLATELETS: 190 10*3/uL (ref 140–400)
RBC: 3.65 10*6/uL — ABNORMAL LOW (ref 4.20–5.82)
RDW: 15 % — AB (ref 11.0–14.6)
RETIC %: 2.42 % — AB (ref 0.80–1.80)
Retic Ct Abs: 88.33 10*3/uL (ref 34.80–93.90)
WBC: 6 10*3/uL (ref 4.0–10.3)
lymph#: 0.9 10*3/uL (ref 0.9–3.3)

## 2017-05-15 LAB — COMPREHENSIVE METABOLIC PANEL
ALBUMIN: 3.5 g/dL (ref 3.5–5.0)
ALT: 11 U/L (ref 0–55)
ANION GAP: 10 meq/L (ref 3–11)
AST: 19 U/L (ref 5–34)
Alkaline Phosphatase: 143 U/L (ref 40–150)
BILIRUBIN TOTAL: 0.43 mg/dL (ref 0.20–1.20)
BUN: 12.5 mg/dL (ref 7.0–26.0)
CALCIUM: 9.1 mg/dL (ref 8.4–10.4)
CO2: 24 meq/L (ref 22–29)
CREATININE: 0.7 mg/dL (ref 0.7–1.3)
Chloride: 105 mEq/L (ref 98–109)
EGFR: >60 mL/min/{1.73_m2} (ref >60)
Glucose: 101 mg/dl (ref 70–140)
Potassium: 4.3 mEq/L (ref 3.5–5.1)
Sodium: 138 mEq/L (ref 136–145)
TOTAL PROTEIN: 6.5 g/dL (ref 6.4–8.3)

## 2017-05-15 LAB — CEA (IN HOUSE-CHCC): CEA (CHCC-In House): 57.08 ng/mL — ABNORMAL HIGH (ref 0.00–5.00)

## 2017-05-15 MED ORDER — PROCHLORPERAZINE MALEATE 10 MG PO TABS
10.0000 mg | ORAL_TABLET | Freq: Once | ORAL | Status: AC
  Administered 2017-05-15: 10 mg via ORAL

## 2017-05-15 MED ORDER — PROCHLORPERAZINE MALEATE 10 MG PO TABS
ORAL_TABLET | ORAL | Status: AC
  Filled 2017-05-15: qty 1

## 2017-05-15 MED ORDER — SODIUM CHLORIDE 0.9 % IV SOLN
2400.0000 mg/m2 | INTRAVENOUS | Status: DC
  Administered 2017-05-15: 3950 mg via INTRAVENOUS
  Filled 2017-05-15: qty 79

## 2017-05-15 NOTE — Patient Instructions (Signed)
[facility] Discharge Instructions for Patients Receiving Chemotherapy  Today you received the following chemotherapy agents 5FU  To help prevent nausea and vomiting after your treatment, we encourage you to take your nausea medication as needed   If you develop nausea and vomiting that is not controlled by your nausea medication, call the clinic.   BELOW ARE SYMPTOMS THAT SHOULD BE REPORTED IMMEDIATELY:  *FEVER GREATER THAN 100.5 F  *CHILLS WITH OR WITHOUT FEVER  NAUSEA AND VOMITING THAT IS NOT CONTROLLED WITH YOUR NAUSEA MEDICATION  *UNUSUAL SHORTNESS OF BREATH  *UNUSUAL BRUISING OR BLEEDING  TENDERNESS IN MOUTH AND THROAT WITH OR WITHOUT PRESENCE OF ULCERS  *URINARY PROBLEMS  *BOWEL PROBLEMS  UNUSUAL RASH Items with * indicate a potential emergency and should be followed up as soon as possible.  Feel free to call the clinic should you have any questions or concerns. The clinic phone number is [phone].  Please show the [place] at check-in to the Emergency Department and triage nurse.

## 2017-05-17 ENCOUNTER — Ambulatory Visit (HOSPITAL_BASED_OUTPATIENT_CLINIC_OR_DEPARTMENT_OTHER): Payer: No Typology Code available for payment source

## 2017-05-17 DIAGNOSIS — Z452 Encounter for adjustment and management of vascular access device: Secondary | ICD-10-CM

## 2017-05-17 DIAGNOSIS — C155 Malignant neoplasm of lower third of esophagus: Secondary | ICD-10-CM | POA: Diagnosis not present

## 2017-05-17 MED ORDER — HEPARIN SOD (PORK) LOCK FLUSH 100 UNIT/ML IV SOLN
500.0000 [IU] | Freq: Once | INTRAVENOUS | Status: AC | PRN
  Administered 2017-05-17: 500 [IU]
  Filled 2017-05-17: qty 5

## 2017-05-17 MED ORDER — SODIUM CHLORIDE 0.9% FLUSH
10.0000 mL | INTRAVENOUS | Status: DC | PRN
  Administered 2017-05-17: 10 mL
  Filled 2017-05-17: qty 10

## 2017-05-23 ENCOUNTER — Ambulatory Visit (HOSPITAL_COMMUNITY)
Admission: RE | Admit: 2017-05-23 | Discharge: 2017-05-23 | Disposition: A | Discharge status: Home / Self Care | Payer: No Typology Code available for payment source | Source: Ambulatory Visit | Attending: Hematology | Admitting: Hematology

## 2017-05-23 DIAGNOSIS — J90 Pleural effusion, not elsewhere classified: Secondary | ICD-10-CM | POA: Insufficient documentation

## 2017-05-23 DIAGNOSIS — I2510 Atherosclerotic heart disease of native coronary artery without angina pectoris: Secondary | ICD-10-CM | POA: Insufficient documentation

## 2017-05-23 DIAGNOSIS — I700 Atherosclerosis of aorta: Secondary | ICD-10-CM | POA: Insufficient documentation

## 2017-05-23 DIAGNOSIS — Z01812 Encounter for preprocedural laboratory examination: Secondary | ICD-10-CM | POA: Diagnosis present

## 2017-05-23 DIAGNOSIS — N200 Calculus of kidney: Secondary | ICD-10-CM | POA: Insufficient documentation

## 2017-05-23 DIAGNOSIS — C155 Malignant neoplasm of lower third of esophagus: Secondary | ICD-10-CM | POA: Diagnosis present

## 2017-05-23 LAB — GLUCOSE, CAPILLARY: GLUCOSE-CAPILLARY: 104 mg/dL — AB (ref 65–99)

## 2017-05-23 MED ORDER — FLUDEOXYGLUCOSE F - 18 (FDG) INJECTION
6.6000 | Freq: Once | INTRAVENOUS | Status: AC | PRN
  Administered 2017-05-23: 6.6 via INTRAVENOUS

## 2017-05-29 ENCOUNTER — Telehealth: Payer: Self-pay | Admitting: Nurse Practitioner

## 2017-05-29 ENCOUNTER — Ambulatory Visit: Payer: No Typology Code available for payment source

## 2017-05-29 ENCOUNTER — Ambulatory Visit (HOSPITAL_BASED_OUTPATIENT_CLINIC_OR_DEPARTMENT_OTHER): Payer: Non-veteran care | Admitting: Hematology

## 2017-05-29 ENCOUNTER — Ambulatory Visit (HOSPITAL_BASED_OUTPATIENT_CLINIC_OR_DEPARTMENT_OTHER): Payer: Non-veteran care | Admitting: *Deleted

## 2017-05-29 ENCOUNTER — Encounter: Payer: Self-pay | Admitting: Hematology

## 2017-05-29 VITALS — BP 127/57 | HR 80 | Temp 98.0°F | Resp 18 | Ht 65.0 in | Wt 131.6 lb

## 2017-05-29 DIAGNOSIS — Z5111 Encounter for antineoplastic chemotherapy: Secondary | ICD-10-CM

## 2017-05-29 DIAGNOSIS — J90 Pleural effusion, not elsewhere classified: Secondary | ICD-10-CM

## 2017-05-29 DIAGNOSIS — C155 Malignant neoplasm of lower third of esophagus: Secondary | ICD-10-CM

## 2017-05-29 DIAGNOSIS — E440 Moderate protein-calorie malnutrition: Secondary | ICD-10-CM | POA: Diagnosis not present

## 2017-05-29 DIAGNOSIS — I10 Essential (primary) hypertension: Secondary | ICD-10-CM | POA: Diagnosis not present

## 2017-05-29 DIAGNOSIS — Z95828 Presence of other vascular implants and grafts: Secondary | ICD-10-CM

## 2017-05-29 DIAGNOSIS — C7951 Secondary malignant neoplasm of bone: Secondary | ICD-10-CM

## 2017-05-29 LAB — CBC & DIFF AND RETIC
BASO%: 0.1 % (ref 0.0–2.0)
BASOS ABS: 0 10*3/uL (ref 0.0–0.1)
EOS%: 1.2 % (ref 0.0–7.0)
Eosinophils Absolute: 0.1 10*3/uL (ref 0.0–0.5)
HEMATOCRIT: 36.3 % — AB (ref 38.4–49.9)
HEMOGLOBIN: 12.2 g/dL — AB (ref 13.0–17.1)
IMMATURE RETIC FRACT: 8.4 % (ref 3.00–10.60)
LYMPH%: 13.8 % — AB (ref 14.0–49.0)
MCH: 32.3 pg (ref 27.2–33.4)
MCHC: 33.6 g/dL (ref 32.0–36.0)
MCV: 96 fL (ref 79.3–98.0)
MONO#: 0.8 10*3/uL (ref 0.1–0.9)
MONO%: 11.2 % (ref 0.0–14.0)
NEUT#: 5.3 10*3/uL (ref 1.5–6.5)
NEUT%: 73.7 % (ref 39.0–75.0)
Platelets: 199 10*3/uL (ref 140–400)
RBC: 3.78 10*6/uL — ABNORMAL LOW (ref 4.20–5.82)
RDW: 15 % — ABNORMAL HIGH (ref 11.0–14.6)
Retic %: 2.33 % — ABNORMAL HIGH (ref 0.80–1.80)
Retic Ct Abs: 88.07 10*3/uL (ref 34.80–93.90)
WBC: 7.2 10*3/uL (ref 4.0–10.3)
lymph#: 1 10*3/uL (ref 0.9–3.3)

## 2017-05-29 LAB — COMPREHENSIVE METABOLIC PANEL
ALBUMIN: 3.6 g/dL (ref 3.5–5.0)
ALK PHOS: 162 U/L — AB (ref 40–150)
ALT: 10 U/L (ref 0–55)
ANION GAP: 9 meq/L (ref 3–11)
AST: 19 U/L (ref 5–34)
BILIRUBIN TOTAL: 0.48 mg/dL (ref 0.20–1.20)
BUN: 10.4 mg/dL (ref 7.0–26.0)
CO2: 25 mEq/L (ref 22–29)
Calcium: 9.2 mg/dL (ref 8.4–10.4)
Chloride: 106 mEq/L (ref 98–109)
Creatinine: 0.6 mg/dL — ABNORMAL LOW (ref 0.7–1.3)
Glucose: 88 mg/dl (ref 70–140)
Potassium: 4.2 mEq/L (ref 3.5–5.1)
Sodium: 140 mEq/L (ref 136–145)
TOTAL PROTEIN: 6.5 g/dL (ref 6.4–8.3)

## 2017-05-29 MED ORDER — PROCHLORPERAZINE MALEATE 10 MG PO TABS
ORAL_TABLET | ORAL | Status: AC
  Filled 2017-05-29: qty 1

## 2017-05-29 MED ORDER — SODIUM CHLORIDE 0.9 % IV SOLN
2400.0000 mg/m2 | INTRAVENOUS | Status: DC
  Administered 2017-05-29: 3950 mg via INTRAVENOUS
  Filled 2017-05-29: qty 79

## 2017-05-29 MED ORDER — SODIUM CHLORIDE 0.9% FLUSH
10.0000 mL | Freq: Once | INTRAVENOUS | Status: AC
  Administered 2017-05-29: 10 mL
  Filled 2017-05-29: qty 10

## 2017-05-29 MED ORDER — PROCHLORPERAZINE MALEATE 10 MG PO TABS
10.0000 mg | ORAL_TABLET | Freq: Once | ORAL | Status: AC
  Administered 2017-05-29: 10 mg via ORAL

## 2017-05-29 NOTE — Progress Notes (Signed)
[facility]  [phone] [phone]  Clinic Follow up Note   Patient Care Team: [name], MD as PCP - General (Internal Medicine) Truitt Merle, MD as Consulting Physician (Hematology) Kyung Rudd, MD as Consulting Physician (Radiation Oncology) [name], MD as Consulting Physician (Cardiothoracic Surgery) Karie Mainland, RD as Dietitian (Nutrition) Minus Breeding, MD as Consulting Physician (Cardiology) 05/29/2017   Chief complaint: follow-up of metastatic esophageal cancer  SUMMARY OF ONCOLOGIC HISTORY: Oncology History   Presented with solid dysphagia at least daily with chest pain. Involuntary weight loss of 10-12 lb over past year  Malignant neoplasm of lower third of esophagus (HCC)   Staging form: Esophagus - Adenocarcinoma, AJCC 7th Edition   - Clinical stage from 12/15/2015: Stage IIIA (T3, N1, M0) - Signed by Truitt Merle, MD on 12/24/2015   - Pathologic stage from 04/26/2016: yT0, N0, cM0, GX - Signed by [name], MD on 04/27/2016      Malignant neoplasm of lower third of esophagus ([place])   11/23/2015 Procedure    EGD per [facility](Dr. Toledo):6cm mass in lower third of esophagus      11/24/2015 Initial Diagnosis    Esophageal cancer ([place])      11/24/2015 Pathology Results    Mucinous adenocarcinoma, moderately differentiated-Her2 analysis pending      12/05/2015 Imaging    PET scan showed hypermetabolic a mass in distal esophagus, hypermetabolic activity in the left anterior prostate gland, metabolic density in left adrenal nodule, CT attenuation suggest a benign adrenal adenoma. Left apical 2 mm lung nodule.      12/15/2015 Procedure    EUS showed a uT3N1 lesion from 27cm to 35cm from the incisors       12/26/2015 - 02/02/2016 Chemotherapy    Weekly carboplatin AUC 2 and Taxol 45 mg/m, with concurrent radiation      12/26/2015 - 02/02/2016 Radiation Therapy    Neoadjuvant chemoradiation to the  esophageal cancer      04/23/2016 Surgery    total esophagectomy with cervical esophagogastrostomy, pyloroplasty, feeding jejunostomy       04/23/2016 Pathology Results    Esophagectomy showed no residual tumor, 8 lymph nodes were all negative, incidental leiomyoma 0.2 cm      08/14/2016 PET scan    IMPRESSION: 1. Small hypermetabolic left adrenal mass, this has not changed in size compared to 12/01/15 but has a maximum standard uptake value of 6.2 (previously 4.2). Indeterminate density and MRI characteristics, not specific for adenoma. Early metastatic disease is not readily excluded although the lack of change of size is somewhat reassuring. Surveillance recommended. 2. No other hypermetabolic lesions are identified. 3. Moderate to large left pleural effusion with passive atelectasis. 4. Esophagectomy with gastric pull-through. 5. No hypermetabolic liver lesions are seen. 6. Enlarged prostate gland.      08/16/2016 - 08/23/2016 Hospital Admission    He was diagnosed with DIC, unclear etiology. He was given blood products. He underwent echocardiogram which was negative for endocarditis, CTA chest/abd which was negative for aneurysm or dissection as work up for DIC. He was on prednisone for some time, but stopped as it was not helping much for DIC.       08/28/2016 - 09/01/2016 Hospital Admission    He has had issues with recurrent L sided pleural effusions, DIC and melena. He was last admitted in 1/25, given supportive care with a thoracentesis and blood transfusion. Dr Burr Medico has been following DIC and has requested a direct admit  for melena, anemia and thrombocytopenia. GI called for EGD.        08/31/2016 Pathology Results    Bone Marrow Biopsy, right iliac - METASTATIC ADENOCARCINOMA. - SEE COMMENT. PERIPHERAL BLOOD: - NORMOCYTIC ANEMIA. - THROMBOCYTOPENIA.      08/31/2016 Progression    Bone marrow biopsy showed metastatic adenocarcinoma      09/04/2016 -  Chemotherapy     FOLFOX every 2 weeks; changed to maintenance 5-FU every 2 weeks starting 02/06/17      09/19/2016 Procedure    Therapeutic thoracentesis of a left pleural effusion performed on 09/19/16 yielded 1.5 liters of blood-tinged fluid. Reactive appearing mesothelial cells were present.      11/12/2016 PET scan     IMPRESSION: 1. Heterogeneous bony uptake with several mildly avid foci primarily in the lower thoracic spine, lumbar spine, and pelvic bones consistent with known bony metastatic disease. 2. There is new uptake in the right adrenal gland not seen previously with no CT correlate. Recommend attention on follow-up. 3. A rounded focus of uptake in the anterior peripheral left prostate is larger in the interval. The findings are nonspecific and could be infectious, inflammatory, or neoplastic. Focal prostatitis or prostate cancer are most likely. 4. The uptake in the left adrenal gland has decreased in the interval. 5. Mild uptake in the bilateral hila may simply represent reactive nodes. Recommend attention on follow-up. 6. The left-sided pleural effusion is a little larger in the interval with loculated components.      02/20/2017 PET scan    PET 02/20/17 IMPRESSION: 1. No activity suspicious for metastatic adenocarcinoma status post esophagectomy and gastric pull-through. 2. Stable mild prominence of the left adrenal gland and associated hypermetabolic activity from several prior studies. The lack of change implies a benign etiology. No abnormal activity within the right adrenal gland. 3. Persistent focal activity peripherally in the left lobe of the prostate, unchanged from the most recent study. Appearance is nonspecific, and could reflect prostate cancer or a benign nodule. Focal prostatitis less likely given persistence. 4. Stable loculated left pleural effusion and left basilar atelectasis. 5. These results were called by telephone at the time of interpretation on 02/20/2017 at  1:47 pm to Dr. Truitt Merle , who verbally acknowledged these results.      05/23/2017 PET scan    PET  IMPRESSION: 1. No significant change compared to 02/19/2017. 2. No specific findings to suggest metastatic disease in this patient who is status post esophagectomy and gastric pull-through. 3. Left adrenal hypermetabolism and nodularity are unchanged over multiple prior exams, again favoring a benign etiology. 4. Left anterior prostatic hypermetabolism is similar and should be correlated with PSA and possibly prostate MRI, if not performed. 5. Left larger than right pleural effusions, with left-sided mild loculation. 6. Left nephrolithiasis. 7. Coronary artery atherosclerosis. Aortic Atherosclerosis      CURRENT THERAPY: first line chemo FOLFOX every 2 weeks started on 09/04/2016; changed to maintenance 5-FU every 2 weeks starting 02/06/17.  INTERVAL HISTORY: Mr. Mane returns today as scheduled with his wife after PET prior to next cycle 5FU. He has stable moderate fatigue, good appetite. He eats 3-4 times po daily with 12-hour feeding tube nutrition and water at night. Constant numbness/tingling and cold sensation to hands and feet is stable overall. He is able to function but certain tasks take longer, such as buttoning clothes. He noticed his hands feel swollen. No gait or balance disturbance.   REVIEW OF SYSTEMS:   Constitutional: Denies fevers, chills  or abnormal weight loss Eyes: Denies blurriness of vision Ears, nose, mouth, throat, and face: Denies mucositis or sore throat Respiratory: Denies cough, dyspnea or wheezes Cardiovascular: Denies palpitation, chest discomfort or lower extremity swelling Gastrointestinal:  Denies nausea, vomiting, constipation, diarrhea, heartburn or change in bowel habits (+) J tube Skin: Denies abnormal skin rashes Lymphatics: Denies new lymphadenopathy or easy bruising Neurological:Denies  new weaknesses (+) constant numbness and tingling with  cold sensation to hands and feet Behavioral/Psych: Mood is stable, no new changes  All other systems were reviewed with the patient and are negative.  MEDICAL HISTORY:  Past Medical History:  Diagnosis Date  . Esophageal cancer ([place]) 11/23/15   lower 3rd esohagus   . GERD (gastroesophageal reflux disease)   . Hyperlipidemia   . Hypertension     SURGICAL HISTORY: Past Surgical History:  Procedure Laterality Date  . cystoscope  4/12/1   prostate  . ERCP    . INGUINAL HERNIA REPAIR    . IR GENERIC HISTORICAL  08/31/2016   IR FLUORO GUIDE PORT INSERTION RIGHT 08/31/2016 Corrie Mckusick, DO [facility]INTER[facility] RAD  . IR GENERIC HISTORICAL  08/31/2016   IR US GUIDE VASC ACCESS RIGHT 08/31/2016 Corrie Mckusick, DO [facility]INTER[facility]  . LEG SURGERY Left    hole  . PROSTATE BIOPSY  10/05/15  . right shoulder surgery      I have reviewed the social history and family history with the patient and they are unchanged from previous note.  ALLERGIES:  is allergic to no known allergies.  MEDICATIONS:  Current Outpatient Medications  Medication Sig Dispense Refill  . acetaminophen (TYLENOL) 325 MG tablet Take 650 mg by mouth every 6 (six) hours as needed.    Marland Kitchen atenolol (TENORMIN) 25 MG tablet Take 1 tab in the morning and half tab in the evening (Patient taking differently: Take 12.5-25 mg by mouth 2 (two) times daily. Take 46m in the morning and 12.512min the evening) 60 tablet 1  . folic acid (FOLVITE) 1 MG tablet Take 1 tablet (1 mg total) by mouth daily. 30 tablet 0  . lidocaine-prilocaine (EMLA) cream Apply 1 application topically as needed. Apply 1-2 tsp over port site 1 hour prior to chemotherapy 30 g 1  . mirtazapine (REMERON) 15 MG tablet Take 1 tablet (15 mg total) by mouth at bedtime. 30 tablet 2  . Nutritional Supplements (NUTREN 1.5) LIQD 250 mLs by Jejunal Tube route at bedtime. 3 CANS VIA FEEDING TUBE    . prochlorperazine (COMPAZINE) 10 MG tablet Take 1 tablet (10 mg total) by mouth every 6 (six) hours  as needed for nausea or vomiting. 30 tablet 3  . vitamin B-12 1000 MCG tablet Take 1 tablet (1,000 mcg total) by mouth daily. 30 tablet 0  . ondansetron (ZOFRAN ODT) 8 MG disintegrating tablet Take 1 tablet (8 mg total) by mouth every 8 (eight) hours as needed for nausea or vomiting. 30 tablet 2  . oxycodone (OXY-IR) 5 MG capsule Take 5 mg by mouth every 6 (six) hours as needed for pain.      No current facility-administered medications for this visit.    Facility-Administered Medications Ordered in Other Visits  Medication Dose Route Frequency Provider Last Rate Last Dose  . fluorouracil (ADRUCIL) 3,950 mg in sodium chloride 0.9 % 71 mL chemo infusion  2,400 mg/m2 (Treatment Plan Recorded) Intravenous 1 day or 1 dose FeTruitt MerleMD        PHYSICAL EXAMINATION: ECOG PERFORMANCE STATUS: 2 - Symptomatic, <50%  confined to bed  Vitals:   05/29/17 1354  BP: (!) 127/57  Pulse: 80  Resp: 18  Temp: 98 F (36.7 C)  SpO2: 100%   Filed Weights   05/29/17 1354  Weight: 131 lb 9.6 oz (59.7 kg)    GENERAL:alert, no distress and comfortable SKIN: skin color, texture, turgor are normal, no rashes or significant lesions EYES: normal, Conjunctiva are pink and non-injected, sclera clear OROPHARYNX:no exudate, no erythema and lips, buccal mucosa, and tongue normal  NECK: supple, thyroid normal size, non-tender, without nodularity LYMPH:  no palpable cervical or supraclavicular lymphadenopathy  LUNGS: clear to auscultation bilaterally with normal breathing effort HEART: regular rate & rhythm and no murmurs and no lower extremity edema ABDOMEN:abdomen soft, non-tender and normal bowel sounds (+) J tube present, no erythema, minimal drainage present Musculoskeletal:no cyanosis of digits and no clubbing  NEURO: alert & oriented x 3 with fluent speech, no focal motor deficits. Mildly decreased vibratory sense to feet bilaterally per tuning form exam, no sensory deficit to hands.   LABORATORY DATA:  I  have reviewed the data as listed CBC Latest Ref Rng & Units 05/29/2017 05/15/2017 05/01/2017  WBC 4.0 - 10.3 10e3/uL 7.2 6.0 7.0  Hemoglobin 13.0 - 17.1 g/dL 12.2(L) 11.9(L) 12.3(L)  Hematocrit 38.4 - 49.9 % 36.3(L) 35.1(L) 36.1(L)  Platelets 140 - 400 10e3/uL 199 190 246    CMP Latest Ref Rng & Units 05/29/2017 05/15/2017 05/01/2017  Glucose 70 - 140 mg/dl 88 101 101  BUN 7.0 - 26.0 mg/dL 10.4 12.5 10.5  Creatinine 0.7 - 1.3 mg/dL 0.6(L) 0.7 0.6(L)  Sodium 136 - 145 mEq/L 140 138 140  Potassium 3.5 - 5.1 mEq/L 4.2 4.3 4.2  Chloride 101 - 111 mmol/L - - -  CO2 22 - 29 mEq/L 25 24 25   Calcium 8.4 - 10.4 mg/dL 9.2 9.1 9.1  Total Protein 6.4 - 8.3 g/dL 6.5 6.5 6.3(L)  Total Bilirubin 0.20 - 1.20 mg/dL 0.48 0.43 0.35  Alkaline Phos 40 - 150 U/L 162(H) 143 153(H)  AST 5 - 34 U/L 19 19 24   ALT 0 - 55 U/L 10 11 19    Results for JERMALE, CRASS (MRN [id number]) as of 04/30/2017 16:56  Ref. Range 02/06/2017 11:19 02/20/2017 12:10 03/20/2017 09:42 04/17/2017 05/15/2017  CEA ([facility]) Latest Ref Range: 0.00 - 5.00 ng/mL 3.11 2.89 3.37 11.14 57.08   Pathology report [facility] 09/29/2016   PD-L1 Testing 09/29/2016     RADIOGRAPHIC STUDIES: I have personally reviewed the radiological images as listed and agreed with the findings in the report. No results found.   PET 05/23/17 IMPRESSION: 1. No significant change compared to 02/19/2017. 2. No specific findings to suggest metastatic disease in this patient who is status post esophagectomy and gastric pull-through. 3. Left adrenal hypermetabolism and nodularity are unchanged over multiple prior exams, again favoring a benign etiology. 4. Left anterior prostatic hypermetabolism is similar and should be correlated with PSA and possibly prostate MRI, if not performed. 5. Left larger than right pleural effusions, with left-sided mild loculation. 6. Left nephrolithiasis. 7. Coronary artery atherosclerosis. Aortic  Atherosclerosis (ICD10-I70.0).   ASSESSMENT & PLAN: 76 y.o. male presented with dysphagia with solid food  1. 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 DIC, hemolytic anemia, and thrombocytopenia 3. Recurrent left pleural effusion 4. HTN 5. Malnutrition 6. Anorexia and depression 7. Fatigue 8. Goals of care discussion 9. BPH 10. Peripheral neuropathy, secondary to oxaliplatin,  G1  Mr. Vanaman appears stable today. Peripheral neuropathy is stable overall with minimal sensory deficit to feet. Weight and appetite are stable. 05/23/17 PET scan without significant change from 02/19/17, no evidence of metastatic disease; left adrenal hypermetabolism is unchanged. PET shows focal uptake in prostate, he had laser surgery to prostatic obstruction in the past. PSA normal 12/12/16, no concern for prostate cancer. He had bone metastasis confirmed with bone marrow biopsy 07/2016. PET scan is not completely reliable indicator of his bone marrow disease. However, CEA is trending up, 11 in September and 57.08 in October, which is concerning. Anticipate adding dose-reduced oxaliplatin back if CEA continues to trend up, we discussed this and he is agreeable. Tumor is HER2 and PDL-1 negative, MSI-stable, he is not a candidate for immunotherapy or anti-HER2 therapy. Will continue maintenance 5FU q2 weeks for now, will check CEA every 2 weeks. Alk phos is mildly elevated today, 162, with known bone metastasis, otherwise Cmet and CBC are adequate for treatment. He will proceed with next cycle 5FU today, return in 2 weeks for next cycle, f/u in 4 weeks. He will see dietician at next visit. They have questions about recent hospital bill, I sent a message to financial advocates to meet with the family.   PLAN: -Labs reviewed, proceed with 5FU today and q2 weeks -Dietician at next visit -F/u in 4 weeks -I sent message to financial advocates for  their questions and concerns related to hospital bill   Orders Placed This Encounter  Procedures  . CEA    Standing Status:   Standing    Number of Occurrences:   50    Standing Expiration Date:   05/29/2018   All questions were answered. The patient knows to call the clinic with any problems, questions or concerns. No barriers to learning was detected. I spent 20 minutes counseling the patient face to face. The total time spent in the appointment was 25 minutes and more than 50% was on counseling and review of test results     Alla Feeling, NP 05/29/17   Addendum I have seen the patient, examined him. I agree with the assessment and and plan and have edited the notes.   Mr. Cape is clinically doing well.  We reviewed his restaging PET scan findings.  Unfortunately his bone metastasis not measurable on the PET scan, and scan showed no other definitive evidence of metastatic disease.  However his tumor marker has increased from normal to 57 about 2 weeks ago, this is concerning for disease progression. I will continue current maintenance therapy.  If her CEA is slightly trending up, I would recommend adding oxaliplatin back.  Due to his peripheral neuropathy, will consider low dose and monitor closely. The other option is changing to second line carbo,Taxol and ramucirumab. Will see him back in 4 weeks.   Truitt Merle  05/29/2017

## 2017-05-29 NOTE — Patient Instructions (Signed)
Fluorouracil, 5-FU injection What is this medicine? FLUOROURACIL, 5-FU (flure oh YOOR a sil) is a chemotherapy drug. It slows the growth of cancer cells. This medicine is used to treat many types of cancer like breast cancer, colon or rectal cancer, pancreatic cancer, and stomach cancer. This medicine may be used for other purposes; ask your health care provider or pharmacist if you have questions. COMMON BRAND NAME(S): Adrucil What should I tell my health care provider before I take this medicine? They need to know if you have any of these conditions: -blood disorders -dihydropyrimidine dehydrogenase (DPD) deficiency -infection (especially a virus infection such as chickenpox, cold sores, or herpes) -kidney disease -liver disease -malnourished, poor nutrition -recent or ongoing radiation therapy -an unusual or allergic reaction to fluorouracil, other chemotherapy, other medicines, foods, dyes, or preservatives -pregnant or trying to get pregnant -breast-feeding How should I use this medicine? This drug is given as an infusion or injection into a vein. It is administered in a hospital or clinic by a specially trained health care professional. Talk to your pediatrician regarding the use of this medicine in children. Special care may be needed. Overdosage: If you think you have taken too much of this medicine contact a poison control center or emergency room at once. NOTE: This medicine is only for you. Do not share this medicine with others. What if I miss a dose? It is important not to miss your dose. Call your doctor or health care professional if you are unable to keep an appointment. What may interact with this medicine? -allopurinol -cimetidine -dapsone -digoxin -hydroxyurea -leucovorin -levamisole -medicines for seizures like ethotoin, fosphenytoin, phenytoin -medicines to increase blood counts like filgrastim, pegfilgrastim, sargramostim -medicines that treat or prevent blood  clots like warfarin, enoxaparin, and dalteparin -methotrexate -metronidazole -pyrimethamine -some other chemotherapy drugs like busulfan, cisplatin, estramustine, vinblastine -trimethoprim -trimetrexate -vaccines Talk to your doctor or health care professional before taking any of these medicines: -acetaminophen -aspirin -ibuprofen -ketoprofen -naproxen This list may not describe all possible interactions. Give your health care provider a list of all the medicines, herbs, non-prescription drugs, or dietary supplements you use. Also tell them if you smoke, drink alcohol, or use illegal drugs. Some items may interact with your medicine. What should I watch for while using this medicine? Visit your doctor for checks on your progress. This drug may make you feel generally unwell. This is not uncommon, as chemotherapy can affect healthy cells as well as cancer cells. Report any side effects. Continue your course of treatment even though you feel ill unless your doctor tells you to stop. In some cases, you may be given additional medicines to help with side effects. Follow all directions for their use. Call your doctor or health care professional for advice if you get a fever, chills or sore throat, or other symptoms of a cold or flu. Do not treat yourself. This drug decreases your body's ability to fight infections. Try to avoid being around people who are sick. This medicine may increase your risk to bruise or bleed. Call your doctor or health care professional if you notice any unusual bleeding. Be careful brushing and flossing your teeth or using a toothpick because you may get an infection or bleed more easily. If you have any dental work done, tell your dentist you are receiving this medicine. Avoid taking products that contain aspirin, acetaminophen, ibuprofen, naproxen, or ketoprofen unless instructed by your doctor. These medicines may hide a fever. Do not become pregnant while taking this    medicine. Women should inform their doctor if they wish to become pregnant or think they might be pregnant. There is a potential for serious side effects to an unborn child. Talk to your health care professional or pharmacist for more information. Do not breast-feed an infant while taking this medicine. Men should inform their doctor if they wish to father a child. This medicine may lower sperm counts. Do not treat diarrhea with over the counter products. Contact your doctor if you have diarrhea that lasts more than 2 days or if it is severe and watery. This medicine can make you more sensitive to the sun. Keep out of the sun. If you cannot avoid being in the sun, wear protective clothing and use sunscreen. Do not use sun lamps or tanning beds/booths. What side effects may I notice from receiving this medicine? Side effects that you should report to your doctor or health care professional as soon as possible: -allergic reactions like skin rash, itching or hives, swelling of the face, lips, or tongue -low blood counts - this medicine may decrease the number of white blood cells, red blood cells and platelets. You may be at increased risk for infections and bleeding. -signs of infection - fever or chills, cough, sore throat, pain or difficulty passing urine -signs of decreased platelets or bleeding - bruising, pinpoint red spots on the skin, black, tarry stools, blood in the urine -signs of decreased red blood cells - unusually weak or tired, fainting spells, lightheadedness -breathing problems -changes in vision -chest pain -mouth sores -nausea and vomiting -pain, swelling, redness at site where injected -pain, tingling, numbness in the hands or feet -redness, swelling, or sores on hands or feet -stomach pain -unusual bleeding Side effects that usually do not require medical attention (report to your doctor or health care professional if they continue or are bothersome): -changes in finger or  toe nails -diarrhea -dry or itchy skin -hair loss -headache -loss of appetite -sensitivity of eyes to the light -stomach upset -unusually teary eyes This list may not describe all possible side effects. Call your doctor for medical advice about side effects. You may report side effects to FDA at 1-800-FDA-1088. Where should I keep my medicine? This drug is given in a hospital or clinic and will not be stored at home. NOTE: This sheet is a summary. It may not cover all possible information. If you have questions about this medicine, talk to your doctor, pharmacist, or health care provider.  2018 [employer] (2007-11-12 13:53:16)  

## 2017-05-29 NOTE — Telephone Encounter (Signed)
Gave avs and calendar for December  °

## 2017-05-31 ENCOUNTER — Ambulatory Visit (HOSPITAL_BASED_OUTPATIENT_CLINIC_OR_DEPARTMENT_OTHER): Payer: Non-veteran care

## 2017-05-31 VITALS — BP 121/60 | HR 75 | Temp 98.0°F | Resp 18

## 2017-05-31 DIAGNOSIS — Z452 Encounter for adjustment and management of vascular access device: Secondary | ICD-10-CM | POA: Diagnosis not present

## 2017-05-31 DIAGNOSIS — C155 Malignant neoplasm of lower third of esophagus: Secondary | ICD-10-CM | POA: Diagnosis not present

## 2017-05-31 DIAGNOSIS — C7951 Secondary malignant neoplasm of bone: Secondary | ICD-10-CM

## 2017-05-31 MED ORDER — SODIUM CHLORIDE 0.9% FLUSH
10.0000 mL | INTRAVENOUS | Status: DC | PRN
  Administered 2017-05-31: 10 mL
  Filled 2017-05-31: qty 10

## 2017-05-31 MED ORDER — HEPARIN SOD (PORK) LOCK FLUSH 100 UNIT/ML IV SOLN
500.0000 [IU] | Freq: Once | INTRAVENOUS | Status: AC | PRN
  Administered 2017-05-31: 500 [IU]
  Filled 2017-05-31: qty 5

## 2017-06-12 ENCOUNTER — Other Ambulatory Visit: Payer: No Typology Code available for payment source

## 2017-06-12 ENCOUNTER — Ambulatory Visit (HOSPITAL_BASED_OUTPATIENT_CLINIC_OR_DEPARTMENT_OTHER): Payer: Non-veteran care

## 2017-06-12 ENCOUNTER — Other Ambulatory Visit (HOSPITAL_BASED_OUTPATIENT_CLINIC_OR_DEPARTMENT_OTHER): Payer: Non-veteran care

## 2017-06-12 ENCOUNTER — Ambulatory Visit: Payer: No Typology Code available for payment source | Admitting: Hematology

## 2017-06-12 ENCOUNTER — Ambulatory Visit: Payer: No Typology Code available for payment source | Admitting: Nutrition

## 2017-06-12 VITALS — BP 111/64 | HR 92 | Temp 99.1°F | Resp 18

## 2017-06-12 DIAGNOSIS — Z5111 Encounter for antineoplastic chemotherapy: Secondary | ICD-10-CM | POA: Diagnosis not present

## 2017-06-12 DIAGNOSIS — C155 Malignant neoplasm of lower third of esophagus: Secondary | ICD-10-CM

## 2017-06-12 DIAGNOSIS — C7951 Secondary malignant neoplasm of bone: Secondary | ICD-10-CM | POA: Diagnosis not present

## 2017-06-12 LAB — CBC & DIFF AND RETIC
BASO%: 0.4 % (ref 0.0–2.0)
BASOS ABS: 0 10*3/uL (ref 0.0–0.1)
EOS ABS: 0.2 10*3/uL (ref 0.0–0.5)
EOS%: 2.8 % (ref 0.0–7.0)
HEMATOCRIT: 35.8 % — AB (ref 38.4–49.9)
HEMOGLOBIN: 12.2 g/dL — AB (ref 13.0–17.1)
Immature Retic Fract: 10.1 % (ref 3.00–10.60)
LYMPH%: 16.4 % (ref 14.0–49.0)
MCH: 32.7 pg (ref 27.2–33.4)
MCHC: 34.1 g/dL (ref 32.0–36.0)
MCV: 96 fL (ref 79.3–98.0)
MONO#: 0.9 10*3/uL (ref 0.1–0.9)
MONO%: 15.9 % — ABNORMAL HIGH (ref 0.0–14.0)
NEUT%: 64.5 % (ref 39.0–75.0)
NEUTROS ABS: 3.5 10*3/uL (ref 1.5–6.5)
Platelets: 179 10*3/uL (ref 140–400)
RBC: 3.73 10*6/uL — ABNORMAL LOW (ref 4.20–5.82)
RDW: 15.2 % — ABNORMAL HIGH (ref 11.0–14.6)
RETIC %: 3.28 % — AB (ref 0.80–1.80)
Retic Ct Abs: 122.34 10*3/uL — ABNORMAL HIGH (ref 34.80–93.90)
WBC: 5.4 10*3/uL (ref 4.0–10.3)
lymph#: 0.9 10*3/uL (ref 0.9–3.3)

## 2017-06-12 LAB — COMPREHENSIVE METABOLIC PANEL
ALBUMIN: 3.7 g/dL (ref 3.5–5.0)
ALT: 14 U/L (ref 0–55)
AST: 25 U/L (ref 5–34)
Alkaline Phosphatase: 195 U/L — ABNORMAL HIGH (ref 40–150)
Anion Gap: 9 mEq/L (ref 3–11)
BUN: 13.1 mg/dL (ref 7.0–26.0)
CHLORIDE: 104 meq/L (ref 98–109)
CO2: 24 mEq/L (ref 22–29)
Calcium: 9.2 mg/dL (ref 8.4–10.4)
Creatinine: 0.7 mg/dL (ref 0.7–1.3)
GLUCOSE: 94 mg/dL (ref 70–140)
POTASSIUM: 4.1 meq/L (ref 3.5–5.1)
SODIUM: 138 meq/L (ref 136–145)
TOTAL PROTEIN: 6.6 g/dL (ref 6.4–8.3)
Total Bilirubin: 0.56 mg/dL (ref 0.20–1.20)

## 2017-06-12 LAB — CEA (IN HOUSE-CHCC): CEA (CHCC-In House): 255.29 ng/mL — ABNORMAL HIGH (ref 0.00–5.00)

## 2017-06-12 MED ORDER — DEXTROSE 5 % IV SOLN: Freq: Once | INTRAVENOUS | Status: DC

## 2017-06-12 MED ORDER — PROCHLORPERAZINE MALEATE 10 MG PO TABS
10.0000 mg | ORAL_TABLET | Freq: Once | ORAL | Status: AC
  Administered 2017-06-12: 10 mg via ORAL

## 2017-06-12 MED ORDER — SODIUM CHLORIDE 0.9 % IV SOLN
2400.0000 mg/m2 | INTRAVENOUS | Status: DC
  Administered 2017-06-12: 3950 mg via INTRAVENOUS
  Filled 2017-06-12: qty 79

## 2017-06-12 MED ORDER — PROCHLORPERAZINE MALEATE 10 MG PO TABS
ORAL_TABLET | ORAL | Status: AC
  Filled 2017-06-12: qty 1

## 2017-06-12 NOTE — Patient Instructions (Signed)
Fluorouracil, 5-FU injection What is this medicine? FLUOROURACIL, 5-FU (flure oh YOOR a sil) is a chemotherapy drug. It slows the growth of cancer cells. This medicine is used to treat many types of cancer like breast cancer, colon or rectal cancer, pancreatic cancer, and stomach cancer. This medicine may be used for other purposes; ask your health care provider or pharmacist if you have questions. COMMON BRAND NAME(S): Adrucil What should I tell my health care provider before I take this medicine? They need to know if you have any of these conditions: -blood disorders -dihydropyrimidine dehydrogenase (DPD) deficiency -infection (especially a virus infection such as chickenpox, cold sores, or herpes) -kidney disease -liver disease -malnourished, poor nutrition -recent or ongoing radiation therapy -an unusual or allergic reaction to fluorouracil, other chemotherapy, other medicines, foods, dyes, or preservatives -pregnant or trying to get pregnant -breast-feeding How should I use this medicine? This drug is given as an infusion or injection into a vein. It is administered in a hospital or clinic by a specially trained health care professional. Talk to your pediatrician regarding the use of this medicine in children. Special care may be needed. Overdosage: If you think you have taken too much of this medicine contact a poison control center or emergency room at once. NOTE: This medicine is only for you. Do not share this medicine with others. What if I miss a dose? It is important not to miss your dose. Call your doctor or health care professional if you are unable to keep an appointment. What may interact with this medicine? -allopurinol -cimetidine -dapsone -digoxin -hydroxyurea -leucovorin -levamisole -medicines for seizures like ethotoin, fosphenytoin, phenytoin -medicines to increase blood counts like filgrastim, pegfilgrastim, sargramostim -medicines that treat or prevent blood  clots like warfarin, enoxaparin, and dalteparin -methotrexate -metronidazole -pyrimethamine -some other chemotherapy drugs like busulfan, cisplatin, estramustine, vinblastine -trimethoprim -trimetrexate -vaccines Talk to your doctor or health care professional before taking any of these medicines: -acetaminophen -aspirin -ibuprofen -ketoprofen -naproxen This list may not describe all possible interactions. Give your health care provider a list of all the medicines, herbs, non-prescription drugs, or dietary supplements you use. Also tell them if you smoke, drink alcohol, or use illegal drugs. Some items may interact with your medicine. What should I watch for while using this medicine? Visit your doctor for checks on your progress. This drug may make you feel generally unwell. This is not uncommon, as chemotherapy can affect healthy cells as well as cancer cells. Report any side effects. Continue your course of treatment even though you feel ill unless your doctor tells you to stop. In some cases, you may be given additional medicines to help with side effects. Follow all directions for their use. Call your doctor or health care professional for advice if you get a fever, chills or sore throat, or other symptoms of a cold or flu. Do not treat yourself. This drug decreases your body's ability to fight infections. Try to avoid being around people who are sick. This medicine may increase your risk to bruise or bleed. Call your doctor or health care professional if you notice any unusual bleeding. Be careful brushing and flossing your teeth or using a toothpick because you may get an infection or bleed more easily. If you have any dental work done, tell your dentist you are receiving this medicine. Avoid taking products that contain aspirin, acetaminophen, ibuprofen, naproxen, or ketoprofen unless instructed by your doctor. These medicines may hide a fever. Do not become pregnant while taking this    medicine. Women should inform their doctor if they wish to become pregnant or think they might be pregnant. There is a potential for serious side effects to an unborn child. Talk to your health care professional or pharmacist for more information. Do not breast-feed an infant while taking this medicine. Men should inform their doctor if they wish to father a child. This medicine may lower sperm counts. Do not treat diarrhea with over the counter products. Contact your doctor if you have diarrhea that lasts more than 2 days or if it is severe and watery. This medicine can make you more sensitive to the sun. Keep out of the sun. If you cannot avoid being in the sun, wear protective clothing and use sunscreen. Do not use sun lamps or tanning beds/booths. What side effects may I notice from receiving this medicine? Side effects that you should report to your doctor or health care professional as soon as possible: -allergic reactions like skin rash, itching or hives, swelling of the face, lips, or tongue -low blood counts - this medicine may decrease the number of white blood cells, red blood cells and platelets. You may be at increased risk for infections and bleeding. -signs of infection - fever or chills, cough, sore throat, pain or difficulty passing urine -signs of decreased platelets or bleeding - bruising, pinpoint red spots on the skin, black, tarry stools, blood in the urine -signs of decreased red blood cells - unusually weak or tired, fainting spells, lightheadedness -breathing problems -changes in vision -chest pain -mouth sores -nausea and vomiting -pain, swelling, redness at site where injected -pain, tingling, numbness in the hands or feet -redness, swelling, or sores on hands or feet -stomach pain -unusual bleeding Side effects that usually do not require medical attention (report to your doctor or health care professional if they continue or are bothersome): -changes in finger or  toe nails -diarrhea -dry or itchy skin -hair loss -headache -loss of appetite -sensitivity of eyes to the light -stomach upset -unusually teary eyes This list may not describe all possible side effects. Call your doctor for medical advice about side effects. You may report side effects to FDA at 1-800-FDA-1088. Where should I keep my medicine? This drug is given in a hospital or clinic and will not be stored at home. NOTE: This sheet is a summary. It may not cover all possible information. If you have questions about this medicine, talk to your doctor, pharmacist, or health care provider.  2018 [employer] (2007-11-12 13:53:16)  

## 2017-06-12 NOTE — Progress Notes (Signed)
Nutrition follow-up completed with patient and his wife in the infusion room for esophageal cancer. Weight stable and documented as 131 pounds. Patient continues to use 3 bottles Nutren 1.5 via jejunostomy tube overnight. Patient continues to eat small amounts of food throughout the day. Patient denies nutrition impact symptoms.  Nutrition diagnosis: Unintentional weight loss resolved.  Patient encouraged to continue strategies for adequate calories and protein intake for weight maintenance. Patient has my contact information for questions or concerns. I will follow as needed.  **Disclaimer: This note was dictated with voice recognition software. Similar sounding words can inadvertently be transcribed and this note may contain transcription errors which may not have been corrected upon publication of note.**

## 2017-06-14 ENCOUNTER — Ambulatory Visit (HOSPITAL_BASED_OUTPATIENT_CLINIC_OR_DEPARTMENT_OTHER): Payer: Non-veteran care

## 2017-06-14 DIAGNOSIS — Z95828 Presence of other vascular implants and grafts: Secondary | ICD-10-CM

## 2017-06-14 DIAGNOSIS — C155 Malignant neoplasm of lower third of esophagus: Secondary | ICD-10-CM

## 2017-06-14 DIAGNOSIS — C7951 Secondary malignant neoplasm of bone: Secondary | ICD-10-CM | POA: Diagnosis not present

## 2017-06-14 DIAGNOSIS — Z452 Encounter for adjustment and management of vascular access device: Secondary | ICD-10-CM

## 2017-06-14 MED ORDER — HEPARIN SOD (PORK) LOCK FLUSH 100 UNIT/ML IV SOLN
500.0000 [IU] | Freq: Once | INTRAVENOUS | Status: AC
  Administered 2017-06-14: 500 [IU]
  Filled 2017-06-14: qty 5

## 2017-06-14 MED ORDER — SODIUM CHLORIDE 0.9% FLUSH
10.0000 mL | Freq: Once | INTRAVENOUS | Status: AC
  Administered 2017-06-14: 10 mL
  Filled 2017-06-14: qty 10

## 2017-06-14 NOTE — Patient Instructions (Signed)

## 2017-06-17 ENCOUNTER — Encounter: Payer: Self-pay | Admitting: *Deleted

## 2017-06-26 ENCOUNTER — Encounter: Payer: Self-pay | Admitting: Nurse Practitioner

## 2017-06-26 ENCOUNTER — Ambulatory Visit (HOSPITAL_BASED_OUTPATIENT_CLINIC_OR_DEPARTMENT_OTHER): Payer: Non-veteran care

## 2017-06-26 ENCOUNTER — Ambulatory Visit (HOSPITAL_BASED_OUTPATIENT_CLINIC_OR_DEPARTMENT_OTHER): Payer: No Typology Code available for payment source | Admitting: Nurse Practitioner

## 2017-06-26 ENCOUNTER — Other Ambulatory Visit (HOSPITAL_BASED_OUTPATIENT_CLINIC_OR_DEPARTMENT_OTHER): Payer: Non-veteran care

## 2017-06-26 ENCOUNTER — Telehealth: Payer: Self-pay | Admitting: Hematology

## 2017-06-26 VITALS — BP 113/56 | HR 88 | Temp 98.4°F | Resp 18 | Ht 65.0 in | Wt 133.5 lb

## 2017-06-26 DIAGNOSIS — C7951 Secondary malignant neoplasm of bone: Secondary | ICD-10-CM | POA: Diagnosis not present

## 2017-06-26 DIAGNOSIS — I10 Essential (primary) hypertension: Secondary | ICD-10-CM | POA: Diagnosis not present

## 2017-06-26 DIAGNOSIS — C155 Malignant neoplasm of lower third of esophagus: Secondary | ICD-10-CM

## 2017-06-26 DIAGNOSIS — Z5111 Encounter for antineoplastic chemotherapy: Secondary | ICD-10-CM

## 2017-06-26 DIAGNOSIS — Z95828 Presence of other vascular implants and grafts: Secondary | ICD-10-CM

## 2017-06-26 DIAGNOSIS — G620 Drug-induced polyneuropathy: Secondary | ICD-10-CM | POA: Diagnosis not present

## 2017-06-26 DIAGNOSIS — Z5189 Encounter for other specified aftercare: Secondary | ICD-10-CM | POA: Diagnosis not present

## 2017-06-26 LAB — CBC & DIFF AND RETIC
BASO%: 0.4 % (ref 0.0–2.0)
Basophils Absolute: 0 10*3/uL (ref 0.0–0.1)
EOS ABS: 0.2 10*3/uL (ref 0.0–0.5)
EOS%: 2.4 % (ref 0.0–7.0)
HCT: 36.8 % — ABNORMAL LOW (ref 38.4–49.9)
HEMOGLOBIN: 12.6 g/dL — AB (ref 13.0–17.1)
IMMATURE RETIC FRACT: 13.2 % — AB (ref 3.00–10.60)
LYMPH%: 14 % (ref 14.0–49.0)
MCH: 32.6 pg (ref 27.2–33.4)
MCHC: 34.2 g/dL (ref 32.0–36.0)
MCV: 95.3 fL (ref 79.3–98.0)
MONO#: 1 10*3/uL — AB (ref 0.1–0.9)
MONO%: 13 % (ref 0.0–14.0)
NEUT%: 70.2 % (ref 39.0–75.0)
NEUTROS ABS: 5.6 10*3/uL (ref 1.5–6.5)
Platelets: 169 10*3/uL (ref 140–400)
RBC: 3.86 10*6/uL — ABNORMAL LOW (ref 4.20–5.82)
RDW: 15.3 % — AB (ref 11.0–14.6)
RETIC %: 3.83 % — AB (ref 0.80–1.80)
RETIC CT ABS: 147.84 10*3/uL — AB (ref 34.80–93.90)
WBC: 7.9 10*3/uL (ref 4.0–10.3)
lymph#: 1.1 10*3/uL (ref 0.9–3.3)

## 2017-06-26 LAB — CEA (IN HOUSE-CHCC): CEA (CHCC-IN HOUSE): 632.11 ng/mL — AB (ref 0.00–5.00)

## 2017-06-26 LAB — COMPREHENSIVE METABOLIC PANEL
ALBUMIN: 3.9 g/dL (ref 3.5–5.0)
ALT: 15 U/L (ref 0–55)
ANION GAP: 10 meq/L (ref 3–11)
AST: 34 U/L (ref 5–34)
Alkaline Phosphatase: 238 U/L — ABNORMAL HIGH (ref 40–150)
BUN: 13.2 mg/dL (ref 7.0–26.0)
CALCIUM: 9.3 mg/dL (ref 8.4–10.4)
CHLORIDE: 105 meq/L (ref 98–109)
CO2: 22 meq/L (ref 22–29)
Creatinine: 0.7 mg/dL (ref 0.7–1.3)
EGFR: >60 mL/min/{1.73_m2} (ref >60)
Glucose: 106 mg/dl (ref 70–140)
POTASSIUM: 4.6 meq/L (ref 3.5–5.1)
Sodium: 137 mEq/L (ref 136–145)
TOTAL PROTEIN: 6.7 g/dL (ref 6.4–8.3)
Total Bilirubin: 0.95 mg/dL (ref 0.20–1.20)

## 2017-06-26 MED ORDER — DEXTROSE 5 % IV SOLN
Freq: Once | INTRAVENOUS | Status: AC
  Administered 2017-06-26: 13:00:00 via INTRAVENOUS

## 2017-06-26 MED ORDER — PROCHLORPERAZINE MALEATE 10 MG PO TABS
10.0000 mg | ORAL_TABLET | Freq: Once | ORAL | Status: AC
  Administered 2017-06-26: 10 mg via ORAL

## 2017-06-26 MED ORDER — SODIUM CHLORIDE 0.9 % IV SOLN
2400.0000 mg/m2 | INTRAVENOUS | Status: DC
  Administered 2017-06-26: 3950 mg via INTRAVENOUS
  Filled 2017-06-26: qty 79

## 2017-06-26 MED ORDER — PROCHLORPERAZINE MALEATE 10 MG PO TABS
ORAL_TABLET | ORAL | Status: AC
  Filled 2017-06-26: qty 1

## 2017-06-26 MED ORDER — SODIUM CHLORIDE 0.9% FLUSH
10.0000 mL | Freq: Once | INTRAVENOUS | Status: AC
  Administered 2017-06-26: 10 mL
  Filled 2017-06-26: qty 10

## 2017-06-26 MED ORDER — ALTEPLASE 2 MG IJ SOLR
2.0000 mg | Freq: Once | INTRAMUSCULAR | Status: AC
  Administered 2017-06-26: 2 mg
  Filled 2017-06-26: qty 2

## 2017-06-26 NOTE — Telephone Encounter (Signed)
Gave avs and calendar for for December and January 2019

## 2017-06-26 NOTE — Patient Instructions (Signed)
[facility] Discharge Instructions for Patients Receiving Chemotherapy  Today you received the following chemotherapy agents: 5FU  To help prevent nausea and vomiting after your treatment, we encourage you to take your nausea medication as directed.   If you develop nausea and vomiting that is not controlled by your nausea medication, call the clinic.   BELOW ARE SYMPTOMS THAT SHOULD BE REPORTED IMMEDIATELY:  *FEVER GREATER THAN 100.5 F  *CHILLS WITH OR WITHOUT FEVER  NAUSEA AND VOMITING THAT IS NOT CONTROLLED WITH YOUR NAUSEA MEDICATION  *UNUSUAL SHORTNESS OF BREATH  *UNUSUAL BRUISING OR BLEEDING  TENDERNESS IN MOUTH AND THROAT WITH OR WITHOUT PRESENCE OF ULCERS  *URINARY PROBLEMS  *BOWEL PROBLEMS  UNUSUAL RASH Items with * indicate a potential emergency and should be followed up as soon as possible.  Feel free to call the clinic should you have any questions or concerns. The clinic phone number is [phone].  Please show the CHEMO ALERT CARD at check-in to the Emergency Department and triage nurse.   

## 2017-06-26 NOTE — Progress Notes (Addendum)
[name]  [phone] [phone]  Clinic Follow up Note   Patient Care Team: Fred Langton, MD as PCP - General (Internal Medicine) Fred Merle, MD as Consulting Physician (Hematology) Fred Rudd, MD as Consulting Physician (Radiation Oncology) Fred Isaac, MD as Consulting Physician (Cardiothoracic Surgery) Fred [name], RD as Dietitian (Nutrition) Fred Breeding, MD as Consulting Physician (Cardiology) 06/26/2017  Chief complaint: follow-up of metastatic esophageal cancer  SUMMARY OF ONCOLOGIC HISTORY: Oncology History   Presented with solid dysphagia at least daily with chest pain. Involuntary weight loss of 10-12 lb over past year  Malignant neoplasm of lower third of esophagus (HCC)   Staging form: Esophagus - Adenocarcinoma, AJCC 7th Edition   - Clinical stage from 12/15/2015: Stage IIIA (T3, N1, M0) - Signed by Fred Merle, MD on 12/24/2015   - Pathologic stage from 04/26/2016: yT0, N0, cM0, GX - Signed by Fred Isaac, MD on 04/27/2016      Malignant neoplasm of lower third of esophagus ([place])   11/23/2015 Procedure    EGD per [facility](Dr. Toledo):6cm mass in lower third of esophagus      11/24/2015 Initial Diagnosis    Esophageal cancer ([place])      11/24/2015 Pathology Results    Mucinous adenocarcinoma, moderately differentiated-Her2 analysis pending      12/05/2015 Imaging    PET scan showed hypermetabolic a mass in distal esophagus, hypermetabolic activity in the left anterior prostate gland, metabolic density in left adrenal nodule, CT attenuation suggest a benign adrenal adenoma. Left apical 2 mm lung nodule.      12/15/2015 Procedure    EUS showed a uT3N1 lesion from 27cm to 35cm from the incisors       12/26/2015 - 02/02/2016 Chemotherapy    Weekly carboplatin AUC 2 and Taxol 45 mg/m, with concurrent radiation      12/26/2015 - 02/02/2016 Radiation Therapy    Neoadjuvant chemoradiation to the  esophageal cancer      04/23/2016 Surgery    total esophagectomy with cervical esophagogastrostomy, pyloroplasty, feeding jejunostomy       04/23/2016 Pathology Results    Esophagectomy showed no residual tumor, 8 lymph nodes were all negative, incidental leiomyoma 0.2 cm      08/14/2016 PET scan    IMPRESSION: 1. Small hypermetabolic left adrenal mass, this has not changed in size compared to 12/01/15 but has a maximum standard uptake value of 6.2 (previously 4.2). Indeterminate density and MRI characteristics, not specific for adenoma. Early metastatic disease is not readily excluded although the lack of change of size is somewhat reassuring. Surveillance recommended. 2. No other hypermetabolic lesions are identified. 3. Moderate to large left pleural effusion with passive atelectasis. 4. Esophagectomy with gastric pull-through. 5. No hypermetabolic liver lesions are seen. 6. Enlarged prostate gland.      08/16/2016 - 08/23/2016 Hospital Admission    He was diagnosed with DIC, unclear etiology. He was given blood products. He underwent echocardiogram which was negative for endocarditis, CTA chest/abd which was negative for aneurysm or dissection as work up for DIC. He was on prednisone for some time, but stopped as it was not helping much for DIC.       08/28/2016 - 09/01/2016 Hospital Admission    He has had issues with recurrent L sided pleural effusions, DIC and melena. He was last admitted in 1/25, given supportive care with a thoracentesis and blood transfusion. Dr Burr Medico has been following DIC and has requested a direct admit for  melena, anemia and thrombocytopenia. GI called for EGD.        08/31/2016 Pathology Results    Bone Marrow Biopsy, right iliac - METASTATIC ADENOCARCINOMA. - SEE COMMENT. PERIPHERAL BLOOD: - NORMOCYTIC ANEMIA. - THROMBOCYTOPENIA.      08/31/2016 Progression    Bone marrow biopsy showed metastatic adenocarcinoma      09/04/2016 -  Chemotherapy     FOLFOX every 2 weeks; changed to maintenance 5-FU every 2 weeks starting 02/06/17      09/19/2016 Procedure    Therapeutic thoracentesis of a left pleural effusion performed on 09/19/16 yielded 1.5 liters of blood-tinged fluid. Reactive appearing mesothelial cells were present.      11/12/2016 PET scan     IMPRESSION: 1. Heterogeneous bony uptake with several mildly avid foci primarily in the lower thoracic spine, lumbar spine, and pelvic bones consistent with known bony metastatic disease. 2. There is new uptake in the right adrenal gland not seen previously with no CT correlate. Recommend attention on follow-up. 3. A rounded focus of uptake in the anterior peripheral left prostate is larger in the interval. The findings are nonspecific and could be infectious, inflammatory, or neoplastic. Focal prostatitis or prostate cancer are most likely. 4. The uptake in the left adrenal gland has decreased in the interval. 5. Mild uptake in the bilateral hila may simply represent reactive nodes. Recommend attention on follow-up. 6. The left-sided pleural effusion is a little larger in the interval with loculated components.      02/20/2017 PET scan    PET 02/20/17 IMPRESSION: 1. No activity suspicious for metastatic adenocarcinoma status post esophagectomy and gastric pull-through. 2. Stable mild prominence of the left adrenal gland and associated hypermetabolic activity from several prior studies. The lack of change implies a benign etiology. No abnormal activity within the right adrenal gland. 3. Persistent focal activity peripherally in the left lobe of the prostate, unchanged from the most recent study. Appearance is nonspecific, and could reflect prostate cancer or a benign nodule. Focal prostatitis less likely given persistence. 4. Stable loculated left pleural effusion and left basilar atelectasis. 5. These results were called by telephone at the time of interpretation on 02/20/2017 at  1:47 pm to Dr. Truitt [name] , who verbally acknowledged these results.      05/23/2017 PET scan    PET  IMPRESSION: 1. No significant change compared to 02/19/2017. 2. No specific findings to suggest metastatic disease in this patient who is status post esophagectomy and gastric pull-through. 3. Left adrenal hypermetabolism and nodularity are unchanged over multiple prior exams, again favoring a benign etiology. 4. Left anterior prostatic hypermetabolism is similar and should be correlated with PSA and possibly prostate MRI, if not performed. 5. Left larger than right pleural effusions, with left-sided mild loculation. 6. Left nephrolithiasis. 7. Coronary artery atherosclerosis. Aortic Atherosclerosis     CURRENT THERAPY: first line chemo FOLFOX every 2 weeks started on 09/04/2016; changed to maintenance 5-FU every 2 weeks starting 02/06/17.  INTERVAL HISTORY: Fred [name] returns for follow up as scheduled prior to next cycle maintenance 5FU. Last treated 06/12/17. He continues to report moderate fatigue and lack of appetite. He tolerates 3 cans J tube feeding and 600 ml free water flush every night. He takes little food and drink by mouth, denies dysphagia, abdominal pain, n/v/c/d. Constant tingling to fingers and toes is stable overall, sensation of "feels like I'm stepping on something but I know it's not there;" no numbness, pain, or sensitivity. Otherwise he has no specific  complaints.   REVIEW OF SYSTEMS:   Constitutional: Denies fevers, chills or abnormal weight loss (+) moderate fatigue  Eyes: Denies blurriness of vision Ears, nose, mouth, throat, and face: Denies mucositis or sore throat Respiratory: Denies cough, dyspnea or wheezes Cardiovascular: Denies palpitation, chest discomfort or lower extremity swelling Gastrointestinal:  Denies nausea, vomiting, constipation, diarrhea, dysphagia, abdomina pain, heartburn or change in bowel habits Skin: Denies abnormal skin  rashes Lymphatics: Denies new lymphadenopathy or easy bruising Neurological:Denies numbness or new weaknesses (+) constant tingling to fingers and toes, stable overall   Behavioral/Psych: Mood is stable, no new changes  All other systems were reviewed with the patient and are negative.  MEDICAL HISTORY:  Past Medical History:  Diagnosis Date  . Esophageal cancer ([place]) 11/23/15   lower 3rd esohagus   . GERD (gastroesophageal reflux disease)   . Hyperlipidemia   . Hypertension     SURGICAL HISTORY: Past Surgical History:  Procedure Laterality Date  . CHEST TUBE INSERTION Left 04/23/2016   Procedure: CHEST TUBE INSERTION;  Surgeon: Fred Isaac, MD;  Location: [place];  Service: Thoracic;  Laterality: Left;  . COLONOSCOPY N/A 08/30/2016   Procedure: COLONOSCOPY;  Surgeon: Milus Banister, MD;  Location: [facility];  Service: Endoscopy;  Laterality: N/A;  . COMPLETE ESOPHAGECTOMY N/A 04/23/2016   Procedure: TRANSHIATIAL TOTAL ESOPHAGECTOMY COMPLETE; CERVICAL ESOPHAGOGASTROSTOMY AND PYLOROPLASTY;  Surgeon: Fred Isaac, MD;  Location: [place];  Service: Thoracic;  Laterality: N/A;  . cystoscope  4/12/1   prostate  . ERCP    . ESOPHAGOGASTRODUODENOSCOPY (EGD) WITH PROPOFOL N/A 08/29/2016   Procedure: ESOPHAGOGASTRODUODENOSCOPY (EGD) WITH PROPOFOL;  Surgeon: Milus Banister, MD;  Location: [facility];  Service: Endoscopy;  Laterality: N/A;  . INGUINAL HERNIA REPAIR    . IR GENERIC HISTORICAL  08/31/2016   IR FLUORO GUIDE PORT INSERTION RIGHT 08/31/2016 Corrie Mckusick, DO [facility]  . IR GENERIC HISTORICAL  08/31/2016   IR US GUIDE VASC ACCESS RIGHT 08/31/2016 Corrie Mckusick, DO [facility]  . JEJUNOSTOMY N/A 04/23/2016   Procedure: FEEDING JEJUNOSTOMY;  Surgeon: Fred Isaac, MD;  Location: [place];  Service: Thoracic;  Laterality: N/A;  . LEG SURGERY Left    hole  . PROSTATE BIOPSY  10/05/15  . right shoulder surgery    . VIDEO BRONCHOSCOPY N/A 04/23/2016   Procedure: VIDEO  BRONCHOSCOPY;  Surgeon: Fred Isaac, MD;  Location: [facility] OR;  Service: Thoracic;  Laterality: N/A;    I have reviewed the social history and family history with the patient and they are unchanged from previous note.  ALLERGIES:  is allergic to no known allergies.  MEDICATIONS:  Current Outpatient Medications  Medication Sig Dispense Refill  . acetaminophen (TYLENOL) 325 MG tablet Take 650 mg by mouth every 6 (six) hours as needed.    Marland Kitchen atenolol (TENORMIN) 25 MG tablet Take 1 tab in the morning and half tab in the evening (Patient taking differently: Take 12.5-25 mg by mouth 2 (two) times daily. Take 77m in the morning and 12.554min the evening) 60 tablet 1  . folic acid (FOLVITE) 1 MG tablet Take 1 tablet (1 mg total) by mouth daily. 30 tablet 0  . lidocaine-prilocaine (EMLA) cream Apply 1 application topically as needed. Apply 1-2 tsp over port site 1 hour prior to chemotherapy 30 g 1  . mirtazapine (REMERON) 15 MG tablet Take 1 tablet (15 mg total) by mouth at bedtime. 30 tablet 2  . Nutritional Supplements (NUTREN 1.5) LIQD 250 mLs  by Jejunal Tube route at bedtime. 3 CANS VIA FEEDING TUBE    . ondansetron (ZOFRAN ODT) 8 MG disintegrating tablet Take 1 tablet (8 mg total) by mouth every 8 (eight) hours as needed for nausea or vomiting. 30 tablet 2  . oxycodone (OXY-IR) 5 MG capsule Take 5 mg by mouth every 6 (six) hours as needed for pain.     Marland Kitchen prochlorperazine (COMPAZINE) 10 MG tablet Take 1 tablet (10 mg total) by mouth every 6 (six) hours as needed for nausea or vomiting. 30 tablet 3  . vitamin B-12 1000 MCG tablet Take 1 tablet (1,000 mcg total) by mouth daily. 30 tablet 0   No current facility-administered medications for this visit.     PHYSICAL EXAMINATION: ECOG PERFORMANCE STATUS: 2-3 BP (!) 113/56 (BP Location: Left Arm, Patient Position: Sitting) Comment: nurse aware  Pulse 88   Temp 98.4 F (36.9 C) (Oral)   Resp 18   Ht 5' 5" (1.651 m)   Wt 133 lb 8 oz (60.6 kg)    SpO2 100%   BMI 22.22 kg/m   GENERAL:alert, no distress and comfortable SKIN: skin color, texture, turgor are normal, no rashes or significant lesions EYES: normal, Conjunctiva are pink and non-injected, sclera clear OROPHARYNX:no exudate, no erythema and lips, buccal mucosa, and tongue normal  NECK: supple, thyroid normal size, non-tender, without nodularity LYMPH:  no palpable cervical, supraclavicular, or axillary lymphadenopathy LUNGS: clear to auscultation bilaterally with normal breathing effort HEART: regular rate & rhythm and no murmurs and no lower extremity edema ABDOMEN:abdomen soft, non-tender and normal bowel sounds (+) J tube present; minimal drainage. Site covered with gauze dressing. No erythema reported  Musculoskeletal:no cyanosis of digits and no clubbing  NEURO: alert & oriented x 3 with fluent speech, no focal motor deficits (+) moderately decreased vibratory sense to feet bilaterally per tuning for exam; no sensory deficit to hands noted.   LABORATORY DATA:  I have reviewed the data as listed CBC Latest Ref Rng & Units 06/12/2017 05/29/2017 05/15/2017  WBC 4.0 - 10.3 10e3/uL 5.4 7.2 6.0  Hemoglobin 13.0 - 17.1 g/dL 12.2(L) 12.2(L) 11.9(L)  Hematocrit 38.4 - 49.9 % 35.8(L) 36.3(L) 35.1(L)  Platelets 140 - 400 10e3/uL 179 199 190     CMP Latest Ref Rng & Units 06/12/2017 05/29/2017 05/15/2017  Glucose 70 - 140 mg/dl 94 88 101  BUN 7.0 - 26.0 mg/dL 13.1 10.4 12.5  Creatinine 0.7 - 1.3 mg/dL 0.7 0.6(L) 0.7  Sodium 136 - 145 mEq/L 138 140 138  Potassium 3.5 - 5.1 mEq/L 4.1 4.2 4.3  Chloride 101 - 111 mmol/L - - -  CO2 22 - 29 mEq/L _0 Calcium 8.4 - 10.4 mg/dL 9.2 9.2 9.1  Total Protein 6.4 - 8.3 g/dL 6.6 6.5 6.5  Total Bilirubin 0.20 - 1.20 mg/dL 0.56 0.48 0.43  Alkaline Phos 40 - 150 U/L 195(H) 162(H) 143  AST 5 - 34 U/L _1 ALT 0 - 55 U/L _2 Results for REMON, QUINTO (MRN [id number]) as of 04/30/2017 16:56  Ref. Range 02/06/2017 11:19  02/20/2017 12:10 03/20/2017 09:42 04/17/2017 05/15/17 06/12/17 06/26/17  CEA ([facility]) Latest Ref Range: 0.00 - 5.00 ng/mL 3.11 2.89 3.37 11.14 57.08 255.29 632.11    Pathology report [facility] 09/29/2016   PD-L1 Testing 09/29/2016     RADIOGRAPHIC STUDIES: I have personally reviewed the radiological images as listed and agreed with the findings in the report. ImagingResults(Last48hours)  No results found.     PET 05/23/17 IMPRESSION: 1. No significant change compared to 02/19/2017. 2. No specific findings to suggest metastatic disease in this patient who is status post esophagectomy and gastric pull-through. 3. Left adrenal hypermetabolism and nodularity are unchanged over multiple prior exams, again favoring a benign etiology. 4. Left anterior prostatic hypermetabolism is similar and should be correlated with PSA and possibly prostate MRI, if not performed. 5. Left larger than right pleural effusions, with left-sided mild loculation. 6. Left nephrolithiasis. 7. Coronary artery atherosclerosis. Aortic Atherosclerosis (ICD10-I70.0).   ASSESSMENT & PLAN: 76 y.o.male presented with dysphagia with solid food  1. 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 DIC, hemolytic anemia, and thrombocytopenia 3. Recurrent left pleural effusion 4. HTN 5. Malnutrition 6. Anorexia and depression 7. Fatigue 8. Goals of care discussion 9. BPH 10. Peripheral neuropathy, secondary to oxaliplatin, G2  Fred [name] appears stable today. He has completed 10 cycles of maintenance 5FU since 02/06/17. He has tolerated well overall with exception of moderate fatigue, decreased appetite, and peripheral neuropathy. Due to increasing CEA, now elevated to 632.11, Dr. Burr Medico recommends changing therapy. He developed neuropathy with oxaliplatin which was stopped with cycle 11 FOLFOX, at which point he was  changed to maintenance 5FU; he does not want to retry oxali which may worsen his neuropathy. Dr. Burr Medico recommends changing to 2nd line taxol, carbo, and cyramza, weekly x2 with 1 week off. We discussed side effects in detail; he agrees to proceed. Labs reviewed, adequate for treatment. BP mildly decreased today, I encouraged him to drink more water. Weight stable; physical exam notable for moderately decreased vibratory sense to feet bilaterally but otherwise unremarkable. He will proceed with 5FU today. He will begin next line therapy after the holiday. He will return 12/28 for f/u and 1st cycle taxol, and cyramza. He will f/u with Dr. Servando Snare 12/20.   PLAN: Labs reviewed, proceed with 5FU today Return 12/28 for f/u and 1st cycle taxol and cyramza  All questions were answered. The patient knows to call the clinic with any problems, questions or concerns. No barriers to learning was detected.     Alla Feeling, NP 06/26/17   Addendum  I have seen the patient, examined him. I agree with the assessment and and plan and have edited the notes.   Fred [name] is clinically doing well, no new complaints, appetite and energy level overall stable, able to function well at home.  His tumor marker CEA has increased significantly in the past few months, it went back up from normal to 632 today, although he has no significant anemia, his reticulocyte count has significant increased, likely mild hemolysis from cancer related DIC. I recommend him to change treatment to second line therapy with Taxol on day 1,8 and 15 and cyramza on day 1 and 15, every 28 days.  Due to his significant neuropathy, I would not restart oxaliplatin.  I am also concerned about neuropathy from Taxol.  We will watch it carefully.  I may consider adding carboplatin to the regiment if he does not respond well or, if we have to reduce his Taxol dose due to neuropathy.  Potential benefits and side effects discussed with patient and his wife in  detail, plan to start in 3 weeks after Christmas holiday.  The goal of care is palliative.  Fred [name] 06/26/2017

## 2017-06-27 ENCOUNTER — Encounter: Payer: PRIVATE HEALTH INSURANCE | Admitting: Cardiothoracic Surgery

## 2017-06-28 ENCOUNTER — Ambulatory Visit (HOSPITAL_BASED_OUTPATIENT_CLINIC_OR_DEPARTMENT_OTHER): Payer: Non-veteran care

## 2017-06-28 VITALS — BP 120/62 | HR 88 | Temp 97.0°F | Resp 20

## 2017-06-28 DIAGNOSIS — C155 Malignant neoplasm of lower third of esophagus: Secondary | ICD-10-CM

## 2017-06-28 DIAGNOSIS — Z452 Encounter for adjustment and management of vascular access device: Secondary | ICD-10-CM | POA: Diagnosis not present

## 2017-06-28 MED ORDER — HEPARIN SOD (PORK) LOCK FLUSH 100 UNIT/ML IV SOLN
500.0000 [IU] | Freq: Once | INTRAVENOUS | Status: AC | PRN
  Administered 2017-06-28: 500 [IU]
  Filled 2017-06-28: qty 5

## 2017-06-28 MED ORDER — SODIUM CHLORIDE 0.9% FLUSH
10.0000 mL | INTRAVENOUS | Status: DC | PRN
  Administered 2017-06-28: 10 mL
  Filled 2017-06-28: qty 10

## 2017-07-04 ENCOUNTER — Encounter: Payer: PRIVATE HEALTH INSURANCE | Admitting: Cardiothoracic Surgery

## 2017-07-10 ENCOUNTER — Encounter: Payer: No Typology Code available for payment source | Admitting: Nutrition

## 2017-07-10 ENCOUNTER — Other Ambulatory Visit: Payer: No Typology Code available for payment source

## 2017-07-10 ENCOUNTER — Ambulatory Visit: Payer: No Typology Code available for payment source

## 2017-07-11 ENCOUNTER — Encounter: Payer: No Typology Code available for payment source | Admitting: Cardiothoracic Surgery

## 2017-07-19 ENCOUNTER — Telehealth: Payer: Self-pay | Admitting: Hematology

## 2017-07-19 ENCOUNTER — Other Ambulatory Visit: Payer: Self-pay | Admitting: *Deleted

## 2017-07-19 ENCOUNTER — Ambulatory Visit: Payer: No Typology Code available for payment source | Admitting: Hematology

## 2017-07-19 ENCOUNTER — Ambulatory Visit (HOSPITAL_BASED_OUTPATIENT_CLINIC_OR_DEPARTMENT_OTHER): Payer: Non-veteran care | Admitting: Hematology

## 2017-07-19 ENCOUNTER — Encounter: Payer: Self-pay | Admitting: Hematology

## 2017-07-19 ENCOUNTER — Other Ambulatory Visit: Payer: No Typology Code available for payment source

## 2017-07-19 ENCOUNTER — Ambulatory Visit (HOSPITAL_COMMUNITY)
Admission: RE | Admit: 2017-07-19 | Discharge: 2017-07-19 | Disposition: A | Discharge status: Home / Self Care | Payer: Non-veteran care | Source: Ambulatory Visit | Attending: Hematology | Admitting: Hematology

## 2017-07-19 ENCOUNTER — Other Ambulatory Visit (HOSPITAL_BASED_OUTPATIENT_CLINIC_OR_DEPARTMENT_OTHER): Payer: Non-veteran care

## 2017-07-19 ENCOUNTER — Ambulatory Visit: Payer: No Typology Code available for payment source

## 2017-07-19 ENCOUNTER — Ambulatory Visit (HOSPITAL_BASED_OUTPATIENT_CLINIC_OR_DEPARTMENT_OTHER): Payer: Non-veteran care

## 2017-07-19 VITALS — BP 111/51 | HR 108 | Temp 98.2°F | Resp 18 | Ht 65.0 in | Wt 132.8 lb

## 2017-07-19 VITALS — BP 112/59 | HR 93 | Temp 98.3°F | Resp 18

## 2017-07-19 DIAGNOSIS — E440 Moderate protein-calorie malnutrition: Secondary | ICD-10-CM

## 2017-07-19 DIAGNOSIS — C7951 Secondary malignant neoplasm of bone: Secondary | ICD-10-CM | POA: Diagnosis not present

## 2017-07-19 DIAGNOSIS — I10 Essential (primary) hypertension: Secondary | ICD-10-CM | POA: Diagnosis not present

## 2017-07-19 DIAGNOSIS — Z5112 Encounter for antineoplastic immunotherapy: Secondary | ICD-10-CM

## 2017-07-19 DIAGNOSIS — C155 Malignant neoplasm of lower third of esophagus: Secondary | ICD-10-CM

## 2017-07-19 DIAGNOSIS — D65 Disseminated intravascular coagulation [defibrination syndrome]: Secondary | ICD-10-CM | POA: Diagnosis not present

## 2017-07-19 DIAGNOSIS — D630 Anemia in neoplastic disease: Secondary | ICD-10-CM | POA: Diagnosis present

## 2017-07-19 DIAGNOSIS — Z5111 Encounter for antineoplastic chemotherapy: Secondary | ICD-10-CM

## 2017-07-19 LAB — CBC & DIFF AND RETIC
BASO%: 1.5 % (ref 0.0–2.0)
BASOS ABS: 0.1 10*3/uL (ref 0.0–0.1)
EOS%: 0.7 % (ref 0.0–7.0)
Eosinophils Absolute: 0.1 10*3/uL (ref 0.0–0.5)
HCT: 21.8 % — ABNORMAL LOW (ref 38.4–49.9)
HEMOGLOBIN: 7.5 g/dL — AB (ref 13.0–17.1)
IMMATURE RETIC FRACT: 34.5 % — AB (ref 3.00–10.60)
LYMPH%: 11 % — AB (ref 14.0–49.0)
MCH: 33.5 pg — AB (ref 27.2–33.4)
MCHC: 34.4 g/dL (ref 32.0–36.0)
MCV: 97.3 fL (ref 79.3–98.0)
MONO#: 1 10*3/uL — ABNORMAL HIGH (ref 0.1–0.9)
MONO%: 10.8 % (ref 0.0–14.0)
NEUT%: 76 % — ABNORMAL HIGH (ref 39.0–75.0)
NEUTROS ABS: 7.3 10*3/uL — AB (ref 1.5–6.5)
NRBC: 10 % — AB (ref 0–0)
PLATELETS: 83 10*3/uL — AB (ref 140–400)
RBC: 2.24 10*6/uL — ABNORMAL LOW (ref 4.20–5.82)
RDW: 19.1 % — AB (ref 11.0–14.6)
Retic %: 15.34 % — ABNORMAL HIGH (ref 0.80–1.80)
Retic Ct Abs: 343.62 10*3/uL — ABNORMAL HIGH (ref 34.80–93.90)
WBC: 9.6 10*3/uL (ref 4.0–10.3)
lymph#: 1.1 10*3/uL (ref 0.9–3.3)

## 2017-07-19 LAB — COMPREHENSIVE METABOLIC PANEL
ALBUMIN: 3.8 g/dL (ref 3.5–5.0)
ALK PHOS: 331 U/L — AB (ref 40–150)
ALT: 19 U/L (ref 0–55)
ANION GAP: 10 meq/L (ref 3–11)
AST: 67 U/L — ABNORMAL HIGH (ref 5–34)
BILIRUBIN TOTAL: 2.25 mg/dL — AB (ref 0.20–1.20)
BUN: 20.8 mg/dL (ref 7.0–26.0)
CO2: 23 mEq/L (ref 22–29)
Calcium: 9 mg/dL (ref 8.4–10.4)
Chloride: 101 mEq/L (ref 98–109)
Creatinine: 0.7 mg/dL (ref 0.7–1.3)
Glucose: 138 mg/dl (ref 70–140)
POTASSIUM: 4.6 meq/L (ref 3.5–5.1)
SODIUM: 134 meq/L — AB (ref 136–145)
TOTAL PROTEIN: 6.4 g/dL (ref 6.4–8.3)

## 2017-07-19 LAB — UA PROTEIN, DIPSTICK - CHCC: Protein, ur: <30 mg/dL

## 2017-07-19 LAB — CEA (IN HOUSE-CHCC)

## 2017-07-19 LAB — PREPARE RBC (CROSSMATCH)

## 2017-07-19 LAB — TECHNOLOGIST REVIEW

## 2017-07-19 MED ORDER — SODIUM CHLORIDE 0.9 % IV SOLN
8.0000 mg/kg | Freq: Once | INTRAVENOUS | Status: AC
  Administered 2017-07-19: 500 mg via INTRAVENOUS
  Filled 2017-07-19: qty 50

## 2017-07-19 MED ORDER — ACETAMINOPHEN 325 MG PO TABS
650.0000 mg | ORAL_TABLET | Freq: Once | ORAL | Status: AC
  Administered 2017-07-19: 650 mg via ORAL

## 2017-07-19 MED ORDER — FAMOTIDINE IN NACL 20-0.9 MG/50ML-% IV SOLN
20.0000 mg | Freq: Once | INTRAVENOUS | Status: AC
  Administered 2017-07-19: 20 mg via INTRAVENOUS

## 2017-07-19 MED ORDER — DIPHENHYDRAMINE HCL 50 MG/ML IJ SOLN
INTRAMUSCULAR | Status: AC
  Filled 2017-07-19: qty 1

## 2017-07-19 MED ORDER — ACETAMINOPHEN 325 MG PO TABS
ORAL_TABLET | ORAL | Status: AC
  Filled 2017-07-19: qty 2

## 2017-07-19 MED ORDER — SODIUM CHLORIDE 0.9 % IV SOLN
Freq: Once | INTRAVENOUS | Status: AC
  Administered 2017-07-19: 13:00:00 via INTRAVENOUS

## 2017-07-19 MED ORDER — FAMOTIDINE IN NACL 20-0.9 MG/50ML-% IV SOLN
INTRAVENOUS | Status: AC
  Filled 2017-07-19: qty 50

## 2017-07-19 MED ORDER — DEXAMETHASONE SODIUM PHOSPHATE 100 MG/10ML IJ SOLN: 10.0000 mg | Freq: Once | INTRAMUSCULAR | Status: DC

## 2017-07-19 MED ORDER — DIPHENHYDRAMINE HCL 50 MG/ML IJ SOLN
50.0000 mg | Freq: Once | INTRAMUSCULAR | Status: AC
  Administered 2017-07-19: 50 mg via INTRAVENOUS

## 2017-07-19 MED ORDER — HEPARIN SOD (PORK) LOCK FLUSH 100 UNIT/ML IV SOLN
500.0000 [IU] | Freq: Once | INTRAVENOUS | Status: AC | PRN
  Administered 2017-07-19: 500 [IU]
  Filled 2017-07-19: qty 5

## 2017-07-19 MED ORDER — SODIUM CHLORIDE 0.9% FLUSH
10.0000 mL | INTRAVENOUS | Status: DC | PRN
  Administered 2017-07-19: 10 mL
  Filled 2017-07-19: qty 10

## 2017-07-19 MED ORDER — DEXAMETHASONE SODIUM PHOSPHATE 10 MG/ML IJ SOLN
10.0000 mg | Freq: Once | INTRAMUSCULAR | Status: AC
  Administered 2017-07-19: 10 mg via INTRAVENOUS

## 2017-07-19 MED ORDER — DEXAMETHASONE SODIUM PHOSPHATE 10 MG/ML IJ SOLN
INTRAMUSCULAR | Status: AC
  Filled 2017-07-19: qty 1

## 2017-07-19 MED ORDER — SODIUM CHLORIDE 0.9 % IV SOLN
80.0000 mg/m2 | Freq: Once | INTRAVENOUS | Status: AC
  Administered 2017-07-19: 132 mg via INTRAVENOUS
  Filled 2017-07-19: qty 22

## 2017-07-19 NOTE — Progress Notes (Signed)
DISCONTINUE ON PATHWAY REGIMEN - Gastroesophageal     A cycle is every 14 days:     Oxaliplatin        Dose Mod: None     Leucovorin        Dose Mod: None     5-Fluorouracil        Dose Mod: None     5-Fluorouracil        Dose Mod: None  **Always confirm dose/schedule in your pharmacy ordering system**    REASON: Disease Progression PRIOR TREATMENT: GEOS3: mFOLFOX6 q14 Days Until Progression or Unacceptable Toxicity TREATMENT RESPONSE: Partial Response (PR)  START ON PATHWAY REGIMEN - Gastroesophageal     A cycle is every 28 days:     Ramucirumab      Paclitaxel   **Always confirm dose/schedule in your pharmacy ordering system**    Patient Characteristics: Distant Metastases (cM1/pM1) / Locally Recurrent Disease, Adenocarcinoma - Esophageal, GE [place]unction, and Gastric, Second Line, MSS / pMMR or MSI Unknown Histology: Adenocarcinoma Disease Classification: Esophageal Therapeutic Status: Distant Metastases (No Additional Staging) Would you be surprised if this patient died  in the next year<= I would NOT be surprised if this patient died in the next year Line of Therapy: Second Line Microsatellite/Mismatch Repair Status: MSS/pMMR Intent of Therapy: Non-Curative / Palliative Intent, Discussed with Patient

## 2017-07-19 NOTE — Progress Notes (Signed)
OK to treat with labs today (bili and Hgb) - per MD Burr Medico - will tx Hgb and will begin new regimen today

## 2017-07-19 NOTE — Progress Notes (Signed)
[facility]  [phone] [phone]  Clinic Follow up Note   Patient Care Team: [name], MD as PCP - General (Internal Medicine) Truitt Merle, MD as Consulting Physician (Hematology) Kyung Rudd, MD as Consulting Physician (Radiation Oncology) [name], MD as Consulting Physician (Cardiothoracic Surgery) Karie Mainland, RD as Dietitian (Nutrition) Minus Breeding, MD as Consulting Physician (Cardiology)   Date of Service:  07/19/2017    CHIEF COMPLAINTS:  Follow up metastatic esophageal cancer    Oncology History   Presented with solid dysphagia at least daily with chest pain. Involuntary weight loss of 10-12 lb over past year  Malignant neoplasm of lower third of esophagus (HCC)   Staging form: Esophagus - Adenocarcinoma, AJCC 7th Edition   - Clinical stage from 12/15/2015: Stage IIIA (T3, N1, M0) - Signed by Truitt Merle, MD on 12/24/2015   - Pathologic stage from 04/26/2016: yT0, N0, cM0, GX - Signed by [name], MD on 04/27/2016      Malignant neoplasm of lower third of esophagus ([place])   11/23/2015 Procedure    EGD per [facility](Dr. Toledo):6cm mass in lower third of esophagus      11/24/2015 Initial Diagnosis    Esophageal cancer ([place])      11/24/2015 Pathology Results    Mucinous adenocarcinoma, moderately differentiated-Her2 analysis pending      12/05/2015 Imaging    PET scan showed hypermetabolic a mass in distal esophagus, hypermetabolic activity in the left anterior prostate gland, metabolic density in left adrenal nodule, CT attenuation suggest a benign adrenal adenoma. Left apical 2 mm lung nodule.      12/15/2015 Procedure    EUS showed a uT3N1 lesion from 27cm to 35cm from the incisors       12/26/2015 - 02/02/2016 Chemotherapy    Weekly carboplatin AUC 2 and Taxol 45 mg/m, with concurrent radiation      12/26/2015 - 02/02/2016 Radiation Therapy    Neoadjuvant chemoradiation to the  esophageal cancer      04/23/2016 Surgery    total esophagectomy with cervical esophagogastrostomy, pyloroplasty, feeding jejunostomy       04/23/2016 Pathology Results    Esophagectomy showed no residual tumor, 8 lymph nodes were all negative, incidental leiomyoma 0.2 cm      08/14/2016 PET scan    IMPRESSION: 1. Small hypermetabolic left adrenal mass, this has not changed in size compared to 12/01/15 but has a maximum standard uptake value of 6.2 (previously 4.2). Indeterminate density and MRI characteristics, not specific for adenoma. Early metastatic disease is not readily excluded although the lack of change of size is somewhat reassuring. Surveillance recommended. 2. No other hypermetabolic lesions are identified. 3. Moderate to large left pleural effusion with passive atelectasis. 4. Esophagectomy with gastric pull-through. 5. No hypermetabolic liver lesions are seen. 6. Enlarged prostate gland.      08/16/2016 - 08/23/2016 Hospital Admission    He was diagnosed with DIC, unclear etiology. He was given blood products. He underwent echocardiogram which was negative for endocarditis, CTA chest/abd which was negative for aneurysm or dissection as work up for DIC. He was on prednisone for some time, but stopped as it was not helping much for DIC.       08/28/2016 - 09/01/2016 Hospital Admission    He has had issues with recurrent L sided pleural effusions, DIC and melena. He was last admitted in 1/25, given supportive care with a thoracentesis and blood transfusion. Dr Burr Medico has been following DIC  and has requested a direct admit for melena, anemia and thrombocytopenia. GI called for EGD.        08/31/2016 Pathology Results    Bone Marrow Biopsy, right iliac - METASTATIC ADENOCARCINOMA. - SEE COMMENT. PERIPHERAL BLOOD: - NORMOCYTIC ANEMIA. - THROMBOCYTOPENIA.      08/31/2016 Progression    Bone marrow biopsy showed metastatic adenocarcinoma      09/04/2016 - 06/26/2017 Chemotherapy      FOLFOX every 2 weeks; changed to maintenance 5-FU every 2 weeks starting 02/06/17. Stopped after 06/26/17 due to significant increase in CEA.       09/19/2016 Procedure    Therapeutic thoracentesis of a left pleural effusion performed on 09/19/16 yielded 1.5 liters of blood-tinged fluid. Reactive appearing mesothelial cells were present.      11/12/2016 PET scan     IMPRESSION: 1. Heterogeneous bony uptake with several mildly avid foci primarily in the lower thoracic spine, lumbar spine, and pelvic bones consistent with known bony metastatic disease. 2. There is new uptake in the right adrenal gland not seen previously with no CT correlate. Recommend attention on follow-up. 3. A rounded focus of uptake in the anterior peripheral left prostate is larger in the interval. The findings are nonspecific and could be infectious, inflammatory, or neoplastic. Focal prostatitis or prostate cancer are most likely. 4. The uptake in the left adrenal gland has decreased in the interval. 5. Mild uptake in the bilateral hila may simply represent reactive nodes. Recommend attention on follow-up. 6. The left-sided pleural effusion is a little larger in the interval with loculated components.      02/20/2017 PET scan    PET 02/20/17 IMPRESSION: 1. No activity suspicious for metastatic adenocarcinoma status post esophagectomy and gastric pull-through. 2. Stable mild prominence of the left adrenal gland and associated hypermetabolic activity from several prior studies. The lack of change implies a benign etiology. No abnormal activity within the right adrenal gland. 3. Persistent focal activity peripherally in the left lobe of the prostate, unchanged from the most recent study. Appearance is nonspecific, and could reflect prostate cancer or a benign nodule. Focal prostatitis less likely given persistence. 4. Stable loculated left pleural effusion and left basilar atelectasis. 5. These results were  called by telephone at the time of interpretation on 02/20/2017 at 1:47 pm to Dr. Truitt Merle , who verbally acknowledged these results.      05/23/2017 PET scan    PET  IMPRESSION: 1. No significant change compared to 02/19/2017. 2. No specific findings to suggest metastatic disease in this patient who is status post esophagectomy and gastric pull-through. 3. Left adrenal hypermetabolism and nodularity are unchanged over multiple prior exams, again favoring a benign etiology. 4. Left anterior prostatic hypermetabolism is similar and should be correlated with PSA and possibly prostate MRI, if not performed. 5. Left larger than right pleural effusions, with left-sided mild loculation. 6. Left nephrolithiasis. 7. Coronary artery atherosclerosis. Aortic Atherosclerosis      07/19/2017 -  Chemotherapy    Started second line therapy with Taxol on day 1,8 and 15 and ramucirumab on day 1 and 15, every 28 days starting 07/19/17       HISTORY OF PRESENTING ILLNESS (12/14/2015):  Fred [name] 76 y.o. male is here because of His newly diagnosed esophageal cancer. He is accompanied by his wife and daughter to my clinic today.  He has had dysphagia for 2 months, mainly with solid food, no dysphagia with soft food or liquid. He also has mild pain  in mid chest when he swallows. No nausea, abdominal pain, or other discomfort. He has lost about 15 lbs in the last year, no other symptoms. He was seen by his primary care physician at [facility], and referred to gastroenterologist Dr. Alice Reichert. He underwent EGD on 11/23/2015, which showed a 6 cm mass in the lower third of esophagus. Biopsy showed adenocarcinoma, moderately differentiated. He was referred to [place] to consider neoadjuvant therapy. He is scheduled to see a Psychologist, sport and exercise at [facility] later this week.  He has had some urinary frequency, underwent TURP on 11/02/2015.   CURRENT THERAPY:    Started second line therapy with Taxol on day 1,8 and 15 and  ramucirumab on day 1 and 15, every 28 days starting 07/19/17    INTERIM HISTORY:  Fred [name] returns for follow up and treatment. He presents to the clinic today accompanied by his wife. He notes he has been tired as his anemia has dropped to 7.5. His wife asks can they change his chemo treatment to Wednesdays in case he gets sick post treatment.  He notes a sore on the right side of his gums. This will bleed only when he brushes his teeth. He denies chest pain or pain anywhere else. His wife notes he has not been sleeping well lately. He can increase to 30 mg of mirtazapine. He has been getting up more frequently to go to the bathroom.     MEDICAL HISTORY:  Past Medical History:  Diagnosis Date  . Esophageal cancer ([place]) 11/23/15   lower 3rd esohagus   . GERD (gastroesophageal reflux disease)   . Hyperlipidemia   . Hypertension     SURGICAL HISTORY: Past Surgical History:  Procedure Laterality Date  . CHEST TUBE INSERTION Left 04/23/2016   Procedure: CHEST TUBE INSERTION;  Surgeon: [name], MD;  Location: [place];  Service: Thoracic;  Laterality: Left;  . COLONOSCOPY N/A 08/30/2016   Procedure: COLONOSCOPY;  Surgeon: Milus Banister, MD;  Location: [facility];  Service: Endoscopy;  Laterality: N/A;  . COMPLETE ESOPHAGECTOMY N/A 04/23/2016   Procedure: TRANSHIATIAL TOTAL ESOPHAGECTOMY COMPLETE; CERVICAL ESOPHAGOGASTROSTOMY AND PYLOROPLASTY;  Surgeon: [name], MD;  Location: [place];  Service: Thoracic;  Laterality: N/A;  . cystoscope  4/12/1   prostate  . ERCP    . ESOPHAGOGASTRODUODENOSCOPY (EGD) WITH PROPOFOL N/A 08/29/2016   Procedure: ESOPHAGOGASTRODUODENOSCOPY (EGD) WITH PROPOFOL;  Surgeon: Milus Banister, MD;  Location: [facility];  Service: Endoscopy;  Laterality: N/A;  . INGUINAL HERNIA REPAIR    . IR GENERIC HISTORICAL  08/31/2016   IR FLUORO GUIDE PORT INSERTION RIGHT 08/31/2016 Corrie Mckusick, DO [facility]  . IR GENERIC HISTORICAL  08/31/2016   IR US GUIDE  VASC ACCESS RIGHT 08/31/2016 Corrie Mckusick, DO [facility]  . JEJUNOSTOMY N/A 04/23/2016   Procedure: FEEDING JEJUNOSTOMY;  Surgeon: [name], MD;  Location: [place];  Service: Thoracic;  Laterality: N/A;  . LEG SURGERY Left    hole  . PROSTATE BIOPSY  10/05/15  . right shoulder surgery    . VIDEO BRONCHOSCOPY N/A 04/23/2016   Procedure: VIDEO BRONCHOSCOPY;  Surgeon: [name], MD;  Location[facility];  Service: Thoracic;  Laterality: N/A;    SOCIAL HISTORY: Social History   Socioeconomic History  . Marital status: Married    Spouse name: Not on file  . Number of children: 1  . Years of education: Not on file  . Highest education level: Not on file  Social Needs  .  Financial resource strain: Not on file  . Food insecurity - worry: Not on file  . Food insecurity - inability: Not on file  . Transportation needs - medical: Not on file  . Transportation needs - non-medical: Not on file  Occupational History  . Occupation: Truck Geophysicist/field seismologist  Tobacco Use  . Smoking status: Former Smoker    Packs/day: 1.00    Years: 10.00    Pack years: 10.00    Last attempt to quit: 07/24/1967    Years since quitting: 50.0  . Smokeless tobacco: Never Used  Substance and Sexual Activity  . Alcohol use: No  . Drug use: No  . Sexual activity: Not Currently  Other Topics Concern  . Not on file  Social History Narrative  . Not on file    FAMILY HISTORY: Family History  Problem Relation Age of Onset  . Cancer Maternal Grandfather 75       gastric cancer   . Alzheimer's disease Mother   . Diabetes Mother   . Stroke Father 42  . Multiple sclerosis Sister     ALLERGIES:  is allergic to no known allergies.  MEDICATIONS:  Current Outpatient Medications  Medication Sig Dispense Refill  . acetaminophen (TYLENOL) 325 MG tablet Take 650 mg by mouth every 6 (six) hours as needed.    Marland Kitchen atenolol (TENORMIN) 25 MG tablet Take 1 tab in the morning and half tab in the evening (Patient taking  differently: Take 12.5-25 mg by mouth 2 (two) times daily. Take 66m in the morning and 12.556min the evening) 60 tablet 1  . folic acid (FOLVITE) 1 MG tablet Take 1 tablet (1 mg total) by mouth daily. 30 tablet 0  . lidocaine-prilocaine (EMLA) cream Apply 1 application topically as needed. Apply 1-2 tsp over port site 1 hour prior to chemotherapy 30 g 1  . mirtazapine (REMERON) 15 MG tablet Take 1 tablet (15 mg total) by mouth at bedtime. 30 tablet 2  . Nutritional Supplements (NUTREN 1.5) LIQD 250 mLs by Jejunal Tube route at bedtime. 3 CANS VIA FEEDING TUBE    . prochlorperazine (COMPAZINE) 10 MG tablet Take 1 tablet (10 mg total) by mouth every 6 (six) hours as needed for nausea or vomiting. 30 tablet 3  . vitamin B-12 1000 MCG tablet Take 1 tablet (1,000 mcg total) by mouth daily. 30 tablet 0  . ondansetron (ZOFRAN ODT) 8 MG disintegrating tablet Take 1 tablet (8 mg total) by mouth every 8 (eight) hours as needed for nausea or vomiting. (Patient not taking: Reported on 07/19/2017) 30 tablet 2  . oxycodone (OXY-IR) 5 MG capsule Take 5 mg by mouth every 6 (six) hours as needed for pain.      No current facility-administered medications for this visit.    Facility-Administered Medications Ordered in Other Visits  Medication Dose Route Frequency Provider Last Rate Last Dose  . sodium chloride flush (NS) 0.9 % injection 10 mL  10 mL Intracatheter PRN FeTruitt MerleMD   10 mL at 07/19/17 1914    REVIEW OF SYSTEMS:   Constitutional: Denies fevers, chills or abnormal night sweats   (+) significant fatigue  (+) less sleep Eyes: Denies blurriness of vision, double vision or watery eyes Ears, nose, mouth, throat, and face: Denies mucositis or sore throat  Respiratory: Denies cough, dyspnea or wheezes Cardiovascular: Denies palpitation, chest discomfort or lower extremity swelling Gastrointestinal:  Denies heartburn or change in bowel habits Skin: Denies abnormal skin rashes Lymphatics: Denies new  lymphadenopathy  or easy bruising Neurological:Denies numbness, tingling or new weaknesses (+) Neuropathy, improved Behavioral/Psych: Mood is stable, no new changes  Musculoskeletal: (+) ambulates with cane All other systems were reviewed with the patient and are negative.  PHYSICAL EXAMINATION:  ECOG PERFORMANCE STATUS: 2-3 Vitals:   07/19/17 1123  BP: (!) 111/51  Pulse: (!) 108  Resp: 18  Temp: 98.2 F (36.8 C)  TempSrc: Oral  SpO2: 100%  Weight: 132 lb 12.8 oz (60.2 kg)  Height: _0  (1.651 m)    GENERAL:alert, no distress and comfortable, in wheelchair SKIN: Appears to be pale, skin texture, turgor are normal, no rashes or significant lesions EYES: normal, conjunctiva are pink and non-injected, sclera clear OROPHARYNX:no exudate, no erythema and lips, buccal mucosa, and tongue normal  NECK: supple, thyroid normal size, non-tender, without nodularity LYMPH:  no palpable lymphadenopathy in the cervical, axillary or inguinal LUNGS: clear to auscultation and percussion with normal breathing effort HEART: regular rate & rhythm and no murmurs and no lower extremity edema ABDOMEN:abdomen soft, non-tender and normal bowel sounds, J tube in place. No skin erythema or pus. (+) Feeding tube site with old blood around feeding tube site, no discharge. No skin erythema around J tube. Normal mucosa  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  LABORATORY DATA:  I have reviewed the data as listed CBC Latest Ref Rng & Units 07/19/2017 06/26/2017 06/12/2017  WBC 4.0 - 10.3 10e3/uL 9.6 7.9 5.4  Hemoglobin 13.0 - 17.1 g/dL 7.5(L) 12.6(L) 12.2(L)  Hematocrit 38.4 - 49.9 % 21.8(L) 36.8(L) 35.8(L)  Platelets 140 - 400 10e3/uL 83(L) 169 179   CMP Latest Ref Rng & Units 07/19/2017 06/26/2017 06/12/2017  Glucose 70 - 140 mg/dl 138 106 94  BUN 7.0 - 26.0 mg/dL 20.8 13.2 13.1    Creatinine 0.7 - 1.3 mg/dL 0.7 0.7 0.7  Sodium 136 - 145 mEq/L 134(L) 137 138  Potassium 3.5 - 5.1 mEq/L 4.6 4.6 4.1  Chloride 101 - 111 mmol/L - - -  CO2 22 - 29 mEq/L _1 Calcium 8.4 - 10.4 mg/dL 9.0 9.3 9.2  Total Protein 6.4 - 8.3 g/dL 6.4 6.7 6.6  Total Bilirubin 0.20 - 1.20 mg/dL 2.25(H) 0.95 0.56  Alkaline Phos 40 - 150 U/L 331(H) 238(H) 195(H)  AST 5 - 34 U/L 67(H) 34 25  ALT 0 - 55 U/L _2 Results for Fred [name], Fred [name] (MRN [id number]) as of 07/19/2017 11:34  Ref. Range 03/20/2017 09:42 04/17/2017 09:24 05/15/2017 10:40 06/12/2017 10:27 06/26/2017 09:58  CEA ([facility]) Latest Ref Range: 0.00 - 5.00 ng/mL 3.37 11.14 (H) 57.08 (H) 255.29 (H) 632.11 (H)  PENDING 07/19/17     Pathology report [facility] TESTING 09/29/2016   PD-L1 Testing 09/29/2016   Diagnosis 09/27/2016 Consult- Comprehensive, [id number] - 1A Mass, Lower third of Esophagus, Mucosal Biopsy - ADENOCARCINOMA WITH EXTRACELLULAR MUCIN. - SEE COMMENT. Microscopic Comment Provided Her2 IHC with the appropriate controls is negative. Per report Her2 FISH is also negative. There is limited tissue remaining, but [facility] One and PDL-1 testing will be attempted as requested.  Diagnosis 09/19/2016 PLEURAL FLUID, LEFT (SPECIMEN 1 OF 1 COLLECTED 09/19/16): REACTIVE APPEARING MESOTHELIAL CELLS PRESENT.  Diagnosis 08/31/2016 Bone Marrow Biopsy, right iliac - METASTATIC ADENOCARCINOMA. - SEE COMMENT. PERIPHERAL BLOOD: - NORMOCYTIC ANEMIA. - THROMBOCYTOPENIA.  Diagnosis 08/02/16 PLEURAL FLUID, LEFT (SPECIMEN 1 OF 1  COLLECTED 08/02/16): REACTIVE MESOTHELIAL CELLS PRESENT. LYMPHOCYTES PRESENT.  Diagnosis 11/23/2015 Mass, lower third of the esophagus, mucosal biopsy Mucinous adenocarcinoma, moderately differentiated.  Diagnosis 04/23/2016 1. Lymph node, biopsy, Cervical - ONE OF ONE LYMPH NODES NEGATIVE FOR CARCINOMA (0/1). 2. Lymph node, biopsy, Periesophageal - BLOOD WITH SCANT BENIGN SOFT  TISSUE AND SKELETAL MUSCLE. - NO MALIGNANCY IDENTIFIED. 3. Esophagogastrectomy - ULCERATION WITH INFLAMMATION AND REACTIVE CHANGES. - FOCAL INTESTINAL METAPLASIA. - CHANGES CONSISTENT WITH TREATMENT EFFECT. - SEVEN OF SEVEN LYMPH NODES NEGATIVE FOR CARCINOMA (0/7). - INCIDENTAL LEIOMYOMA, 0.2 CM. - MARGINS ARE NEGATIVE FOR DYSPLASIA OR MALIGNANCY. - SEE ONCOLOGY TABLE. Microscopic Comment 3. ESOPHAGUS: Specimen: Esophagus and proximal stomach, cervical lymph node. Procedure: Esophagogastrectomy and cervical lymph node biopsy. Tumor Site: Presumed GE junction, see comment. Relationship of Tumor to esophagogastric junction: Can not be assessed. Distance of tumor center from esophagogastric junction: Can not be assessed. Tumor Size Greatest dimension: No residual tumor present. Histologic Type: Per medical record adenocarcinoma (no biopsy for review). Histologic Grade: N/A. Microscopic Tumor Extension: N/A. Margins: Negative. Treatment Effect: Present (TRS 0). Lymph-Vascular Invasion: Not identified. Perineural Invasion: Not identified. Lymph nodes: number examined 8; number positive: 0 TNM: ypT0, ypN0 see comment. 1 of 3 FINAL for Fred [name], Fred [name] ([id number]) Microscopic Comment(continued) Ancillary studies: None. Comments: The entire GE junction is submitted for evaluation. There is ulceration with associated inflammation. There is acellular mucin/myxoid changes which extend through the muscularis propria, but no viable/residual tumor is identified and thus the stage is ypT0. There is focal background intestinal metaplasia, which may have been pre-existing (Barrett's esophagus) or related to therapy.   RADIOGRAPHIC STUDIES: I have personally reviewed the radiological images as listed and agreed with the findings in the report. No results found. PET 08/14/2016 IMPRESSION: 1. Small hypermetabolic left adrenal mass, this has not changed in size compared to 12/01/15 but has a  maximum standard uptake value of 6.2 (previously 4.2). Indeterminate density and MRI characteristics, not specific for adenoma. Early metastatic disease is not readily excluded although the lack of change of size is somewhat reassuring. Surveillance recommended. 2. No other hypermetabolic lesions are identified. 3. Moderate to large left pleural effusion with passive atelectasis. 4. Esophagectomy with gastric pull-through. 5. No hypermetabolic liver lesions are seen. 6. Enlarged prostate gland.  CT angio chest/abdomen/pelvis for dissection w/wo contrast 08/22/2016 IMPRESSION: No evidence of aortic aneurysm or dissection. No evidence of pulmonary embolus. Heart is borderline in size. Stable small left adrenal nodule which was hypermetabolic on prior PET CT. Stable large left pleural effusion with compressive atelectasis in the left lower lobe. Stable appearance of the gastric pull-through post esophagectomy.  US Thoracentesis asp pleural space 09/19/2016 IMPRESSION: Successful ultrasound guided diagnostic and therapeutic left thoracentesis yielding 1.5 liters of pleural fluid.  PET 11/12/2016 IMPRESSION: 1. Heterogeneous bony uptake with several mildly avid foci primarily in the lower thoracic spine, lumbar spine, and pelvic bones consistent with known bony metastatic disease. 2. There is new uptake in the right adrenal gland not seen previously with no CT correlate. Recommend attention on follow-up. 3. A rounded focus of uptake in the anterior peripheral left prostate is larger in the interval. The findings are nonspecific and could be infectious, inflammatory, or neoplastic. Focal prostatitis or prostate cancer are most likely. 4. The uptake in the left adrenal gland has decreased in the interval. 5. Mild uptake in the bilateral hila may simply represent reactive nodes. Recommend attention on follow-up. 6. The left-sided pleural effusion is a little larger in the  interval with  loculated components.    ASSESSMENT & PLAN:  76 y.o. male presented with dysphagia with solid food  1. 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 -The initial PET scan showed no definitive distant metastasis. 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 This will be followed in the future scan. -By EUS, he had T3 N1 stage III disease. -He completed neoadjuvant radiation with weekly Carbo and Taxol. -I previously reviewed his surgical pathology findings, he has had complete pathologic response to neoadjuvant chemotherapy and radiation, which predicts good prognosis -Unfortunately he developed diffuse bone metastasis in January 2018, confirmed by bone marrow biopsy -He has started first-line chemotherapy FOLFOX, tolerating well, and his anemia and thrombocytopenia has previously improved/resolved since chemotherapy -The goal of therapy is palliative -his tumor was negative for HER2 and PD-L1 expression and MSI-stable,  he is not a candidate for immunotherapy or anti-her2 therapy  -I previously reviewed his [facility] One genomic testing results, unfortunately there is no good targeted therapy available. 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 I also discussed the option of maintenance Xeloda, he opted 5-fu  -We reviewed his 05/23/17 PET. Unfortunately his bone metastasis not measurable on the PET scan, and scan showed no other definitive evidence of  metastatic disease.  -He unfortunately developed disease progression on maintenance therapy.  His CEA on 06/26/17 increased to 632.  Due to his neuropathy, it would be difficult to restart oxaliplatin.  Will change his treatment to second line therapy with Taxol on day 1,8 and 15 and ramucirumab on day 1 and 15, every 28 days starting 07/19/17  - I am also concerned about neuropathy from Taxol. We will watch it carefully.  I may consider adding carboplatin to the regiment if he does not respond well or, if we have to reduce his Taxol dose due to neuropathy. Potential benefits and side effects discussed with patient and his wife in detail, He is agreeable.  -Labs reviewed, total bili at 2.25, alk phos at 331 and AST at 67. Hg dropped to 7.5, he also developed thrombocytopenia.  This is consistent with his initial presentation of DIC secondary to diffuse bone metastasis.  He will receive a blood transfusion today.  His Tumor marker increased to 3138.84. Labs overall adequate to proceed with second line therapy today.  -Chemo consent obtained, will proceed first cycle Taxol and ramucirumab today  -I encouraged him to contact us if he develops any significant tor unexpected side effects.  -F/u next week   2. 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 thrombocytopenia has resolved. -The patient had many blood transfusions with the last on 09/28/16, he has not required any blood transfusion since he started chemotherapy. -He has developed a DIC again when he had cancer progression -His HG has dropped to 7.5 and plt dropped to 83K (07/19/17) -Will receive blood transfusion -I advised him to closely watch his BP   3. 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  -He has had 3 repeated thoracentesis, all cytology were negative, -Therapeutic thoracentesis performed on 09/19/16 yielded 1.5  liters of blood-tinged fluid. Reactive appearing mesothelial cells were present. -He still has small residual left pleural effusion, overall stable, -Continue monitoring, previously repeated CXR -follow-up with Dr. Servando Snare  4. HTN -Continue medication and follow-up of his primary care physician -his PCP at [facility] has changed his BP meds lately   5. Malnutrition  -Doing better, he is tolerating J-tube feeding very well, he eats normally. Encouraged the patient to eat small frequent meals. -he has decreased his tube feeds lately  -Follow up with dietitian -Feeding tube site is clean and no bleeding lately  -Weight has been stable lately, he takes 3-4 cans a day in addition to eating.   6. Anorexia and depression  -continue mirtazapine. Overall, previously improved lately  -He hasn't talked to the nutritionist in a while, he will follow up   7. Fatigue  -He would like something to help his energy. -We previously discussed steroids to help for this, but I told them that this is not a long term solution -He would not like steroids at this time.  -We previously discussed fatigue being related to chemo or anemia.  -I previously encouraged him to eat well with protein and be more active   8. Goal of care discussion  -We previously discussed the incurable nature of his cancer, and the overall poor prognosis, especially if he does not have good response to chemotherapy or progress on chemo -The patient understands the goal of care is palliative. -I recommend DNR/DNI, he will think about it   9. 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  -He has been off  Oxaliplatin for 2 months now -His numbness has increased further up his hands. He denies any tingling or pain. If this progresses further I may stop 5FU -residual neuropathy still present  -Will monitor with Taxol treatment   11. Insomnia  -I recommend he increase his mirtazapine to 30 mg, he is agreeable.   Plan -Increase Mirtazapine to 27m  -Labs reviewed and Hg is 7.5, Type and cross today and will receive blood transfusion 1u today and 1 unit tomorrow  -Labs adequate to start with second line therapy today  -Lab, flush, infusion in 1 and 2 weeks  -F/u in 1 week   All questions were answered. The patient knows to call the clinic with any problems, questions or concerns.  I spent 20 minutes counseling the patient face to face. The total time spent in the appointment was 25 minutes and more than 50% was on counseling.   YTruitt Merle MD 07/19/2017   This document serves as a record of services personally performed by YTruitt Merle MD. It was created on her behalf by AJoslyn Devon a trained medical scribe. The creation of this record is based on the scribe's personal observations and the provider's statements to them.    I have reviewed the above documentation for accuracy and completeness, and I agree with the above.

## 2017-07-19 NOTE — Telephone Encounter (Signed)
Scheduled appt per 12/28 los - patient to get updated schedule in the treatment area.

## 2017-07-19 NOTE — Patient Instructions (Addendum)
[facility] Discharge Instructions for Patients Receiving Chemotherapy  Today you received the following chemotherapy agents ramucirumab (Cyramza) and paclitaxel (Taxol).  To help prevent nausea and vomiting after your treatment, we encourage you to take your nausea medication as directed by your doctor.   If you develop nausea and vomiting that is not controlled by your nausea medication, call the clinic.   BELOW ARE SYMPTOMS THAT SHOULD BE REPORTED IMMEDIATELY:  *FEVER GREATER THAN 100.5 F  *CHILLS WITH OR WITHOUT FEVER  NAUSEA AND VOMITING THAT IS NOT CONTROLLED WITH YOUR NAUSEA MEDICATION  *UNUSUAL SHORTNESS OF BREATH  *UNUSUAL BRUISING OR BLEEDING  TENDERNESS IN MOUTH AND THROAT WITH OR WITHOUT PRESENCE OF ULCERS  *URINARY PROBLEMS  *BOWEL PROBLEMS  UNUSUAL RASH Items with * indicate a potential emergency and should be followed up as soon as possible.  Feel free to call the clinic should you have any questions or concerns. The clinic phone number is [phone].  Please show the [facility] at check-in to the Emergency Department and triage nurse.  Ramucirumab injection What is this medicine? RAMUCIRUMAB (ra mue SIR ue mab) is a monoclonal antibody. It is used to treat stomach cancer, colorectal cancer, or lung cancer. This medicine may be used for other purposes; ask your health care provider or pharmacist if you have questions. COMMON BRAND NAME(S): Cyramza What should I tell my health care provider before I take this medicine? They need to know if you have any of these conditions: -bleeding disorders -blood clots -heart disease, including heart failure, heart attack, or chest pain (angina) -high blood pressure -infection (especially a virus infection such as chickenpox, cold sores, or herpes) -protein in your urine -recent surgery -stroke -an unusual or allergic reaction to ramucirumab, other medicines, foods, dyes, or  preservatives -pregnant or trying to get pregnant -breast-feeding How should I use this medicine? This medicine is for infusion into a vein. It is given by a health care professional in a hospital or clinic setting. Talk to your pediatrician regarding the use of this medicine in children. Special care may be needed. Overdosage: If you think you have taken too much of this medicine contact a poison control center or emergency room at once. NOTE: This medicine is only for you. Do not share this medicine with others. What if I miss a dose? It is important not to miss your dose. Call your doctor or health care professional if you are unable to keep an appointment. What may interact with this medicine? Interactions have not been studied. This list may not describe all possible interactions. Give your health care provider a list of all the medicines, herbs, non-prescription drugs, or dietary supplements you use. Also tell them if you smoke, drink alcohol, or use illegal drugs. Some items may interact with your medicine. What should I watch for while using this medicine? Your condition will be monitored carefully while you are receiving this medicine. You will need to to check your blood pressure and have your blood and urine tested while you are taking this medicine. Your condition will be monitored carefully while you are receiving this medicine. This medicine may increase your risk to bruise or bleed. Call your doctor or health care professional if you notice any unusual bleeding. This medicine may rarely cause 'gastrointestinal perforation' (holes in the stomach, intestines or colon), a serious side effect requiring surgery to repair. This medicine should be started at least 28 days following major surgery and the site of  the surgery should be totally healed. Check with your doctor before scheduling dental work or surgery while you are receiving this treatment. Talk to your doctor if you have recently  had surgery or if you have a wound that has not healed. Do not become pregnant while taking this medicine or for 3 months after stopping it. Women should inform their doctor if they wish to become pregnant or think they might be pregnant. There is a potential for serious side effects to an unborn child. Talk to your health care professional or pharmacist for more information. What side effects may I notice from receiving this medicine? Side effects that you should report to your doctor or health care professional as soon as possible: -allergic reactions like skin rash, itching or hives, breathing problems, swelling of the face, lips, or tongue -signs of infection - fever or chills, cough, sore throat -chest pain or chest tightness -confusion -dizziness -feeling faint or lightheaded, falls -severe abdominal pain -severe nausea, vomiting -signs and symptoms of bleeding such as bloody or black, tarry stools; red or dark-brown urine; spitting up blood or brown material that looks like coffee grounds; red spots on the skin; unusual bruising or bleeding from the eye, gums, or nose -signs and symptoms of a blood clot such as breathing problems; changes in vision; chest pain; severe, sudden headache; pain, swelling, warmth in the leg; trouble speaking; sudden numbness or weakness of the face, arm or leg -symptoms of a stroke: change in mental awareness, inability to talk or move one side of the body -trouble walking, dizziness, loss of balance or coordination Side effects that usually do not require medical attention (report to your doctor or health care professional if they continue or are bothersome): -cold, clammy skin -constipation -diarrhea -headache -nausea, vomiting -stomach pain -unusually slow heartbeat -unusually weak or tired This list may not describe all possible side effects. Call your doctor for medical advice about side effects. You may report side effects to FDA at  1-800-FDA-1088. Where should I keep my medicine? This drug is given in a hospital or clinic and will not be stored at home. NOTE: This sheet is a summary. It may not cover all possible information. If you have questions about this medicine, talk to your doctor, pharmacist, or health care provider.  2018 Elsevier/Gold Standard (2015-08-11 08:20:29)  Paclitaxel injection What is this medicine? PACLITAXEL (PAK li TAX el) is a chemotherapy drug. It targets fast dividing cells, like cancer cells, and causes these cells to die. This medicine is used to treat ovarian cancer, breast cancer, and other cancers. This medicine may be used for other purposes; ask your health care provider or pharmacist if you have questions. COMMON BRAND NAME(S): Onxol, Taxol What should I tell my health care provider before I take this medicine? They need to know if you have any of these conditions: -blood disorders -irregular heartbeat -infection (especially a virus infection such as chickenpox, cold sores, or herpes) -liver disease -previous or ongoing radiation therapy -an unusual or allergic reaction to paclitaxel, alcohol, polyoxyethylated castor oil, other chemotherapy agents, other medicines, foods, dyes, or preservatives -pregnant or trying to get pregnant -breast-feeding How should I use this medicine? This drug is given as an infusion into a vein. It is administered in a hospital or clinic by a specially trained health care professional. Talk to your pediatrician regarding the use of this medicine in children. Special care may be needed. Overdosage: If you think you have taken too much of this  medicine contact a poison control center or emergency room at once. NOTE: This medicine is only for you. Do not share this medicine with others. What if I miss a dose? It is important not to miss your dose. Call your doctor or health care professional if you are unable to keep an appointment. What may interact with  this medicine? Do not take this medicine with any of the following medications: -disulfiram -metronidazole This medicine may also interact with the following medications: -cyclosporine -diazepam -ketoconazole -medicines to increase blood counts like filgrastim, pegfilgrastim, sargramostim -other chemotherapy drugs like cisplatin, doxorubicin, epirubicin, etoposide, teniposide, vincristine -quinidine -testosterone -vaccines -verapamil Talk to your doctor or health care professional before taking any of these medicines: -acetaminophen -aspirin -ibuprofen -ketoprofen -naproxen This list may not describe all possible interactions. Give your health care provider a list of all the medicines, herbs, non-prescription drugs, or dietary supplements you use. Also tell them if you smoke, drink alcohol, or use illegal drugs. Some items may interact with your medicine. What should I watch for while using this medicine? Your condition will be monitored carefully while you are receiving this medicine. You will need important blood work done while you are taking this medicine. This medicine can cause serious allergic reactions. To reduce your risk you will need to take other medicine(s) before treatment with this medicine. If you experience allergic reactions like skin rash, itching or hives, swelling of the face, lips, or tongue, tell your doctor or health care professional right away. In some cases, you may be given additional medicines to help with side effects. Follow all directions for their use. This drug may make you feel generally unwell. This is not uncommon, as chemotherapy can affect healthy cells as well as cancer cells. Report any side effects. Continue your course of treatment even though you feel ill unless your doctor tells you to stop. Call your doctor or health care professional for advice if you get a fever, chills or sore throat, or other symptoms of a cold or flu. Do not treat yourself.  This drug decreases your body's ability to fight infections. Try to avoid being around people who are sick. This medicine may increase your risk to bruise or bleed. Call your doctor or health care professional if you notice any unusual bleeding. Be careful brushing and flossing your teeth or using a toothpick because you may get an infection or bleed more easily. If you have any dental work done, tell your dentist you are receiving this medicine. Avoid taking products that contain aspirin, acetaminophen, ibuprofen, naproxen, or ketoprofen unless instructed by your doctor. These medicines may hide a fever. Do not become pregnant while taking this medicine. Women should inform their doctor if they wish to become pregnant or think they might be pregnant. There is a potential for serious side effects to an unborn child. Talk to your health care professional or pharmacist for more information. Do not breast-feed an infant while taking this medicine. Men are advised not to father a child while receiving this medicine. This product may contain alcohol. Ask your pharmacist or healthcare provider if this medicine contains alcohol. Be sure to tell all healthcare providers you are taking this medicine. Certain medicines, like metronidazole and disulfiram, can cause an unpleasant reaction when taken with alcohol. The reaction includes flushing, headache, nausea, vomiting, sweating, and increased thirst. The reaction can last from 30 minutes to several hours. What side effects may I notice from receiving this medicine? Side effects that you should  report to your doctor or health care professional as soon as possible: -allergic reactions like skin rash, itching or hives, swelling of the face, lips, or tongue -low blood counts - This drug may decrease the number of white blood cells, red blood cells and platelets. You may be at increased risk for infections and bleeding. -signs of infection - fever or chills, cough,  sore throat, pain or difficulty passing urine -signs of decreased platelets or bleeding - bruising, pinpoint red spots on the skin, black, tarry stools, nosebleeds -signs of decreased red blood cells - unusually weak or tired, fainting spells, lightheadedness -breathing problems -chest pain -high or low blood pressure -mouth sores -nausea and vomiting -pain, swelling, redness or irritation at the injection site -pain, tingling, numbness in the hands or feet -slow or irregular heartbeat -swelling of the ankle, feet, hands Side effects that usually do not require medical attention (report to your doctor or health care professional if they continue or are bothersome): -bone pain -complete hair loss including hair on your head, underarms, pubic hair, eyebrows, and eyelashes -changes in the color of fingernails -diarrhea -loosening of the fingernails -loss of appetite -muscle or joint pain -red flush to skin -sweating This list may not describe all possible side effects. Call your doctor for medical advice about side effects. You may report side effects to FDA at 1-800-FDA-1088. Where should I keep my medicine? This drug is given in a hospital or clinic and will not be stored at home. NOTE: This sheet is a summary. It may not cover all possible information. If you have questions about this medicine, talk to your doctor, pharmacist, or health care provider.  2018 Elsevier/Gold Standard (2015-05-10 19:58:00)   Blood Transfusion, Care After This sheet gives you information about how to care for yourself after your procedure. Your doctor may also give you more specific instructions. If you have problems or questions, contact your doctor. Follow these instructions at home:  Take over-the-counter and prescription medicines only as told by your doctor.  Go back to your normal activities as told by your doctor.  Follow instructions from your doctor about how to take care of the area where an  IV tube was put into your vein (insertion site). Make sure you: ? Wash your hands with soap and water before you change your bandage (dressing). If there is no soap and water, use hand sanitizer. ? Change your bandage as told by your doctor.  Check your IV insertion site every day for signs of infection. Check for: ? More redness, swelling, or pain. ? More fluid or blood. ? Warmth. ? Pus or a bad smell. Contact a doctor if:  You have more redness, swelling, or pain around the IV insertion site..  You have more fluid or blood coming from the IV insertion site.  Your IV insertion site feels warm to the touch.  You have pus or a bad smell coming from the IV insertion site.  Your pee (urine) turns pink, red, or brown.  You feel weak after doing your normal activities. Get help right away if:  You have signs of a serious allergic or body defense (immune) system reaction, including: ? Itchiness. ? Hives. ? Trouble breathing. ? Anxiety. ? Pain in your chest or lower back. ? Fever, flushing, and chills. ? Fast pulse. ? Rash. ? Watery poop (diarrhea). ? Throwing up (vomiting). ? Dark pee. ? Serious headache. ? Dizziness. ? Stiff neck. ? Yellow color in your face or the white  parts of your eyes (jaundice). Summary  After a blood transfusion, return to your normal activities as told by your doctor.  Every day, check for signs of infection where the IV tube was put into your vein.  Some signs of infection are warm skin, more redness and pain, more fluid or blood, and pus or a bad smell where the needle went in.  Contact your doctor if you feel weak or have any unusual symptoms. This information is not intended to replace advice given to you by your health care provider. Make sure you discuss any questions you have with your health care provider. Document Released: 07/30/2014 Document Revised: 03/02/2016 Document Reviewed: 03/02/2016 Elsevier Interactive Patient Education  2017  [employer].

## 2017-07-19 NOTE — Progress Notes (Signed)
Dr Burr Medico okay to tx with Hgb 7.5 and bilirubin 2.25. Pt to receive 1U PRBCs today and 1U tomorrow.

## 2017-07-20 ENCOUNTER — Other Ambulatory Visit: Payer: Self-pay | Admitting: *Deleted

## 2017-07-20 ENCOUNTER — Ambulatory Visit (HOSPITAL_BASED_OUTPATIENT_CLINIC_OR_DEPARTMENT_OTHER): Payer: No Typology Code available for payment source

## 2017-07-20 ENCOUNTER — Encounter: Payer: Self-pay | Admitting: Hematology

## 2017-07-20 DIAGNOSIS — C155 Malignant neoplasm of lower third of esophagus: Secondary | ICD-10-CM | POA: Diagnosis not present

## 2017-07-20 DIAGNOSIS — D649 Anemia, unspecified: Secondary | ICD-10-CM

## 2017-07-20 MED ORDER — ACETAMINOPHEN 325 MG PO TABS
ORAL_TABLET | ORAL | Status: AC
  Filled 2017-07-20: qty 2

## 2017-07-20 MED ORDER — SODIUM CHLORIDE 0.9% FLUSH
10.0000 mL | INTRAVENOUS | Status: AC | PRN
  Administered 2017-07-20: 10 mL
  Filled 2017-07-20: qty 10

## 2017-07-20 MED ORDER — HEPARIN SOD (PORK) LOCK FLUSH 100 UNIT/ML IV SOLN
500.0000 [IU] | Freq: Every day | INTRAVENOUS | Status: AC | PRN
  Administered 2017-07-20: 500 [IU]
  Filled 2017-07-20: qty 5

## 2017-07-20 MED ORDER — ACETAMINOPHEN 325 MG PO TABS
650.0000 mg | ORAL_TABLET | Freq: Once | ORAL | Status: AC
  Administered 2017-07-20: 650 mg via ORAL

## 2017-07-20 MED ORDER — DIPHENHYDRAMINE HCL 25 MG PO CAPS
ORAL_CAPSULE | ORAL | Status: AC
  Filled 2017-07-20: qty 1

## 2017-07-20 MED ORDER — DIPHENHYDRAMINE HCL 25 MG PO TABS
25.0000 mg | ORAL_TABLET | Freq: Once | ORAL | Status: AC
  Administered 2017-07-20: 25 mg via ORAL
  Filled 2017-07-20: qty 1

## 2017-07-20 MED ORDER — HEPARIN SOD (PORK) LOCK FLUSH 100 UNIT/ML IV SOLN
250.0000 [IU] | INTRAVENOUS | Status: DC | PRN
  Filled 2017-07-20: qty 5

## 2017-07-20 NOTE — Patient Instructions (Signed)

## 2017-07-22 LAB — BPAM RBC
BLOOD PRODUCT EXPIRATION DATE: 201901262359
BLOOD PRODUCT EXPIRATION DATE: 201901262359
ISSUE DATE / TIME: 201812281653
ISSUE DATE / TIME: 201812291037
UNIT TYPE AND RH: 5100
Unit Type and Rh: 5100

## 2017-07-22 LAB — TYPE AND SCREEN
ABO/RH(D): O POS
ANTIBODY SCREEN: NEGATIVE
UNIT DIVISION: 0
Unit division: 0

## 2017-07-24 ENCOUNTER — Other Ambulatory Visit: Payer: Self-pay | Admitting: *Deleted

## 2017-07-24 ENCOUNTER — Ambulatory Visit (HOSPITAL_BASED_OUTPATIENT_CLINIC_OR_DEPARTMENT_OTHER): Payer: No Typology Code available for payment source | Admitting: Medical

## 2017-07-24 ENCOUNTER — Ambulatory Visit (HOSPITAL_COMMUNITY)
Admission: RE | Admit: 2017-07-24 | Discharge: 2017-07-24 | Disposition: A | Discharge status: Home / Self Care | Payer: Non-veteran care | Source: Ambulatory Visit | Attending: Medical | Admitting: Medical

## 2017-07-24 ENCOUNTER — Telehealth: Payer: Self-pay | Admitting: *Deleted

## 2017-07-24 ENCOUNTER — Other Ambulatory Visit (HOSPITAL_COMMUNITY)
Admission: RE | Admit: 2017-07-24 | Discharge: 2017-07-24 | Disposition: A | Discharge status: Home / Self Care | Payer: Non-veteran care | Source: Ambulatory Visit | Attending: Hematology | Admitting: Hematology

## 2017-07-24 VITALS — BP 106/52 | HR 93 | Temp 98.0°F | Resp 20 | Ht 65.0 in | Wt 133.7 lb

## 2017-07-24 DIAGNOSIS — D65 Disseminated intravascular coagulation [defibrination syndrome]: Secondary | ICD-10-CM

## 2017-07-24 DIAGNOSIS — C155 Malignant neoplasm of lower third of esophagus: Secondary | ICD-10-CM

## 2017-07-24 DIAGNOSIS — R0602 Shortness of breath: Secondary | ICD-10-CM

## 2017-07-24 DIAGNOSIS — Z95828 Presence of other vascular implants and grafts: Secondary | ICD-10-CM

## 2017-07-24 DIAGNOSIS — D599 Acquired hemolytic anemia, unspecified: Secondary | ICD-10-CM | POA: Insufficient documentation

## 2017-07-24 DIAGNOSIS — R040 Epistaxis: Secondary | ICD-10-CM

## 2017-07-24 DIAGNOSIS — J90 Pleural effusion, not elsewhere classified: Secondary | ICD-10-CM | POA: Insufficient documentation

## 2017-07-24 DIAGNOSIS — C7951 Secondary malignant neoplasm of bone: Secondary | ICD-10-CM

## 2017-07-24 LAB — CBC WITH DIFFERENTIAL/PLATELET
BASO%: 0.7 % (ref 0.0–2.0)
Basophils Absolute: 0 10*3/uL (ref 0.0–0.1)
EOS%: 1.1 % (ref 0.0–7.0)
Eosinophils Absolute: 0 10*3/uL (ref 0.0–0.5)
HEMATOCRIT: 26.7 % — AB (ref 38.4–49.9)
HGB: 9.2 g/dL — ABNORMAL LOW (ref 13.0–17.1)
LYMPH#: 0.5 10*3/uL — AB (ref 0.9–3.3)
LYMPH%: 17.7 % (ref 14.0–49.0)
MCH: 31.5 pg (ref 27.2–33.4)
MCHC: 34.5 g/dL (ref 32.0–36.0)
MCV: 91.4 fL (ref 79.3–98.0)
MONO#: 0.2 10*3/uL (ref 0.1–0.9)
MONO%: 7.7 % (ref 0.0–14.0)
NEUT%: 72.8 % (ref 39.0–75.0)
NEUTROS ABS: 2 10*3/uL (ref 1.5–6.5)
Platelets: 80 10*3/uL — ABNORMAL LOW (ref 140–400)
RBC: 2.92 10*6/uL — ABNORMAL LOW (ref 4.20–5.82)
RDW: 16.8 % — ABNORMAL HIGH (ref 11.0–14.6)
WBC: 2.7 10*3/uL — AB (ref 4.0–10.3)
nRBC: 3 % — ABNORMAL HIGH (ref 0–0)

## 2017-07-24 LAB — COMPREHENSIVE METABOLIC PANEL
ALBUMIN: 3.5 g/dL (ref 3.5–5.0)
ALK PHOS: 367 U/L — AB (ref 40–150)
ALT: 23 U/L (ref 0–55)
AST: 43 U/L — ABNORMAL HIGH (ref 5–34)
Anion Gap: 8 mEq/L (ref 3–11)
BILIRUBIN TOTAL: 1.03 mg/dL (ref 0.20–1.20)
BUN: 19.5 mg/dL (ref 7.0–26.0)
CALCIUM: 8.4 mg/dL (ref 8.4–10.4)
CO2: 22 mEq/L (ref 22–29)
Chloride: 102 mEq/L (ref 98–109)
Creatinine: 0.6 mg/dL — ABNORMAL LOW (ref 0.7–1.3)
Glucose: 115 mg/dl (ref 70–140)
POTASSIUM: 5 meq/L (ref 3.5–5.1)
Sodium: 131 mEq/L — ABNORMAL LOW (ref 136–145)
TOTAL PROTEIN: 5.9 g/dL — AB (ref 6.4–8.3)

## 2017-07-24 LAB — DIC (DISSEMINATED INTRAVASCULAR COAGULATION) PANEL
APTT: 39 s — AB (ref 24–36)
PLATELETS: 83 10*3/uL — AB (ref 150–400)

## 2017-07-24 LAB — DIC (DISSEMINATED INTRAVASCULAR COAGULATION)PANEL
D-Dimer, Quant: >20 ug/mL-FEU — ABNORMAL HIGH (ref 0.00–0.50)
Fibrinogen: 154 mg/dL — ABNORMAL LOW (ref 210–475)
INR: 1.33
Prothrombin Time: 16.4 seconds — ABNORMAL HIGH (ref 11.4–15.2)
Smear Review: NONE SEEN

## 2017-07-24 LAB — PROTIME-INR
INR: 1.2 — ABNORMAL LOW (ref 2.00–3.50)
PROTIME: 14.4 s — AB (ref 10.6–13.4)

## 2017-07-24 LAB — TECHNOLOGIST REVIEW: Technologist Review: 1

## 2017-07-24 MED ORDER — SODIUM CHLORIDE 0.9% FLUSH
10.0000 mL | Freq: Once | INTRAVENOUS | Status: AC
  Administered 2017-07-24: 10 mL
  Filled 2017-07-24: qty 10

## 2017-07-24 MED ORDER — HEPARIN SOD (PORK) LOCK FLUSH 100 UNIT/ML IV SOLN
250.0000 [IU] | Freq: Once | INTRAVENOUS | Status: AC
  Administered 2017-07-24: 500 [IU]
  Filled 2017-07-24: qty 5

## 2017-07-24 NOTE — Progress Notes (Signed)
These preliminary result these preliminary results were noted.  Awaiting final report.

## 2017-07-24 NOTE — Telephone Encounter (Signed)
Received call from wife Fraser Din requesting a call back about pt's increased shortness of breath.  Spoke with pt and was informed that pt experiences more shortness of breath with any activities.  Has to takes frequent breaks with walking.  Denied SOB at rest.  Stated has nose bleed since yesterday at rest.  Trickling blood - not much - even at rest.  Pt noted bleeding around tube feeding dressing.   Instructed pt to come in now for lab and to see [facility] .  Pt voiced understanding, and stated he will be en route. Pt's    Phone    [phone].

## 2017-07-24 NOTE — Progress Notes (Signed)
[facility] Progress Note   Fred [name] [id number] May 31, 1941 77 y.o.  Fred [name] is managed by Dr. Truitt Merle  Actively treated with chemotherapy: yes  Current Therapy: Taxol and Cyramza  Last Treated: 07/19/2017  Assessment: Plan:    Shortness of breath  Epistaxis   Shortness of breath: The patient's oxygen saturation remained stable at 100% on room air with activity.  Reassured the patient that his shortness of breath could be related to deconditioning, chemotherapy, and his moderate left pleural effusion which is stable on his chest x-ray from today.  He will return for follow-up as scheduled on 07/26/2017.  Epistaxis and scant bleeding around the insertion site of the patient's PEG tube: The patient's labs were stable.  These were reviewed with Dr. Burr Medico.  Dr. Burr Medico states that as long as his fibrinogen is over 100 that there would be no need for infusion of fresh frozen plasma.  The patient was reassured.  He will follow-up as scheduled on 07/26/2017 and has been instructed to return or call sooner should his bleeding or bruising worsen.  Please see After Visit Summary for patient specific instructions.  Future Appointments  Date Time Provider [place]  07/26/2017 10:30 AM [facility] LAB 4 [facility] None  07/26/2017 10:45 AM [facility] INJ NURSE [facility] None  07/26/2017 11:00 AM Truitt Merle, MD [facility] None  07/26/2017 11:45 AM [facility] J32 DNS [facility] None  07/26/2017 12:00 PM Karie Mainland, RD [facility] None  08/02/2017 10:00 AM [facility] LAB 6 [facility] None  08/02/2017 10:15 AM [facility] I27 DNS [facility] None  08/02/2017 10:45 AM Truitt Merle, MD [facility] None  08/02/2017 11:45 AM [facility] D11 [facility] None  08/22/2017  1:30 PM [name], MD [facility]    No orders of the defined types were placed in this encounter.      Subjective:   Patient ID:  Fred [name] is a 77 y.o. (DOB 01-Dec-1940)  male.  Chief Complaint:  Chief Complaint  Patient presents with  . 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  Patient was originally diagnosed in October 2017.  He was begun on weekly carboplatin and Taxol with concurrent radiation therapy which was dosed from 12/26/2015 through 02/02/2016.  He underwent a total esophagogastrectomy with cervical esophagogastrostomy, pyloroplasty, and feeding jejunostomy on 04/23/2016.  He was admitted to the hospital on 08/16/2016 through 08/23/2016 with a diagnosis of DIC of unclear etiology.  He was again admitted on 08/28/2016 through 09/01/2016 for a recurrent left sided pleural effusion, DIC, and melena.  Bone marrow biopsy completed on 08/31/2016 showed a metastatic adenocarcinoma.  He was initiated on FOLFOX on 09/04/2016 with continuation of chemotherapy through 06/26/2017.  Fred [name] was most recently started on second line chemotherapy with Taxol and ramucirumab on 07/20/2017.  At that time of his last visit, he was noted to have a drop in his hemoglobin to 7.5.  He had reported at that time that he was having some bleeding of his gums with brushing of his teeth.  He was transfused with a unit of packed red blood cells on 12/28 and 12/292018.  The patient's wife called this morning stating that the patient was having increased shortness of breath with activities.  He has had a nosebleed since yesterday.  He reports that it is trickling of blood.  He is also having scant bleeding around his  feeding tube dressing.  A CBC from today shows a hemoglobin of 9.2.  Patient's labs show a PT of 16.4, INR of 1.33, PTT of 39, fibrinogen of 154, quantitative DIC of greater than 20, and platelet count of 83.  The patient is scheduled to return for follow-up with Dr. Burr Medico on Friday, 07/26/2017.  Medications: I have reviewed the patient's current medications.  Allergies:   Allergies  Allergen Reactions  . No Known Allergies     Past Medical History:  Diagnosis Date  . Esophageal cancer ([place]) 11/23/15   lower 3rd esohagus   . GERD (gastroesophageal reflux disease)   . Hyperlipidemia   . Hypertension     Past Surgical History:  Procedure Laterality Date  . CHEST TUBE INSERTION Left 04/23/2016   Procedure: CHEST TUBE INSERTION;  Surgeon: [name], MD;  Location: [facility];  Service: Thoracic;  Laterality: Left;  . COLONOSCOPY N/A 08/30/2016   Procedure: COLONOSCOPY;  Surgeon: Milus Banister, MD;  Location: [facility];  Service: Endoscopy;  Laterality: N/A;  . COMPLETE ESOPHAGECTOMY N/A 04/23/2016   Procedure: TRANSHIATIAL TOTAL ESOPHAGECTOMY COMPLETE; CERVICAL ESOPHAGOGASTROSTOMY AND PYLOROPLASTY;  Surgeon: [name], MD;  Location: [facility];  Service: Thoracic;  Laterality: N/A;  . cystoscope  4/12/1   prostate  . ERCP    . ESOPHAGOGASTRODUODENOSCOPY (EGD) WITH PROPOFOL N/A 08/29/2016   Procedure: ESOPHAGOGASTRODUODENOSCOPY (EGD) WITH PROPOFOL;  Surgeon: Milus Banister, MD;  Location: [facility];  Service: Endoscopy;  Laterality: N/A;  . INGUINAL HERNIA REPAIR    . IR GENERIC HISTORICAL  08/31/2016   IR FLUORO GUIDE PORT INSERTION RIGHT 08/31/2016 Corrie Mckusick, DO [facility]  . IR GENERIC HISTORICAL  08/31/2016   IR US GUIDE VASC ACCESS RIGHT 08/31/2016 Corrie Mckusick, DO [facility]  . JEJUNOSTOMY N/A 04/23/2016   Procedure: FEEDING JEJUNOSTOMY;  Surgeon: [name], MD;  Location: [facility];  Service: Thoracic;  Laterality: N/A;  . LEG SURGERY Left    hole  . PROSTATE BIOPSY  10/05/15  . right shoulder surgery    . VIDEO BRONCHOSCOPY N/A 04/23/2016   Procedure: VIDEO BRONCHOSCOPY;  Surgeon: [name], MD;  Location[facility];  Service: Thoracic;  Laterality: N/A;    Family History  Problem Relation Age of Onset  . Cancer Maternal Grandfather 36       gastric cancer   . Alzheimer's disease Mother   . Diabetes Mother   . Stroke  Father 6  . Multiple sclerosis Sister     Social History   Socioeconomic History  . Marital status: Married    Spouse name: Not on file  . Number of children: 1  . Years of education: Not on file  . Highest education level: Not on file  Social Needs  . Financial resource strain: Not on file  . Food insecurity - worry: Not on file  . Food insecurity - inability: Not on file  . Transportation needs - medical: Not on file  . Transportation needs - non-medical: Not on file  Occupational History  . Occupation: Truck Geophysicist/field seismologist  Tobacco Use  . Smoking status: Former Smoker    Packs/day: 1.00    Years: 10.00    Pack years: 10.00    Last attempt to quit: 07/24/1967    Years since quitting: 50.0  . Smokeless tobacco: Never Used  Substance and Sexual Activity  . Alcohol use: No  . Drug use: No  . Sexual activity: Not Currently  Other Topics Concern  .  Not on file  Social History Narrative  . Not on file    Past Medical History, Surgical history, Social history, and Family history were reviewed and updated as appropriate.   Please see review of systems for further details on the patient's review from today.   Review of Systems:  Review of Systems  Constitutional: Positive for fatigue. Negative for chills, diaphoresis and fever.  HENT: Positive for nosebleeds. Negative for trouble swallowing.   Respiratory: Positive for shortness of breath. Negative for cough, choking, chest tightness and wheezing.   Cardiovascular: Negative for chest pain, palpitations and leg swelling.  Gastrointestinal: Negative for blood in stool.       Scant bleeding around the insertion site of a PEG tube.  Genitourinary: Negative for hematuria.    Objective:   Physical Exam:  BP (!) 106/52 (BP Location: Right Arm, Patient Position: Sitting)   Pulse 93   Temp 98 F (36.7 C) (Oral)   Resp 20   Ht 5' 5"  (1.651 m)   Wt 133 lb 11.2 oz (60.6 kg)   SpO2 100%   BMI 22.25 kg/m  ECOG: 1  Oxygen  saturation remained stable at 100% on room air with activity.  Slight tachycardia at 103.  Physical Exam  Constitutional: No distress.  HENT:  Head: Normocephalic and atraumatic.  Mouth/Throat: Oropharynx is clear and moist. No oropharyngeal exudate.  Eyes: Right eye exhibits no discharge. Left eye exhibits no discharge. No scleral icterus.  Neck: Normal range of motion. Neck supple.  Cardiovascular: Normal rate, regular rhythm and normal heart sounds. Exam reveals no gallop and no friction rub.  No murmur heard. Pulmonary/Chest: Effort normal and breath sounds normal. No respiratory distress. He has no wheezes. He has no rales.        Abdominal: Soft. Bowel sounds are normal. He exhibits no distension. There is no tenderness. There is no rebound and no guarding.    Lymphadenopathy:    He has no cervical adenopathy.  Neurological: He is alert. Coordination normal.  Skin: Skin is warm and dry. He is not diaphoretic.       Lab Review:     Component Value Date/Time   NA 134 (L) 07/19/2017 0955   K 4.6 07/19/2017 0955   CL 108 08/31/2016 1342   CO2 23 07/19/2017 0955   GLUCOSE 138 07/19/2017 0955   BUN 20.8 07/19/2017 0955   CREATININE 0.7 07/19/2017 0955   CALCIUM 9.0 07/19/2017 0955   PROT 6.4 07/19/2017 0955   ALBUMIN 3.8 07/19/2017 0955   AST 67 (H) 07/19/2017 0955   ALT 19 07/19/2017 0955   ALKPHOS 331 (H) 07/19/2017 0955   BILITOT 2.25 (H) 07/19/2017 0955   GFRNONAA >60 08/31/2016 1342   GFRAA >60 08/31/2016 1342       Component Value Date/Time   WBC 9.6 07/19/2017 0955   WBC 4.6 09/01/2016 0505   RBC 2.24 (L) 07/19/2017 0955   RBC 2.89 (L) 09/01/2016 0505   HGB 7.5 (L) 07/19/2017 0955   HCT 21.8 (L) 07/19/2017 0955   PLT 83 (L) 07/19/2017 0955   MCV 97.3 07/19/2017 0955   MCH 33.5 (H) 07/19/2017 0955   MCH 30.4 09/01/2016 0505   MCHC 34.4 07/19/2017 0955   MCHC 34.1 09/01/2016 0505   RDW 19.1 (H) 07/19/2017 0955   LYMPHSABS 1.1 07/19/2017 0955    MONOABS 1.0 (H) 07/19/2017 0955   EOSABS 0.1 07/19/2017 0955   BASOSABS 0.1 07/19/2017 0955   -------------------------------  Imaging from last 24  hours (if applicable):  Radiology interpretation: No results found.      This case was discussed with Dr. Burr Medico. She expressed agreement with my management of this patient.

## 2017-07-25 NOTE — Progress Notes (Signed)
[place]  [phone] [phone]  Clinic Follow up Note   Patient Care Team: [name], MD as PCP - General (Internal Medicine) Truitt Merle, MD as Consulting Physician (Hematology) Kyung Rudd, MD as Consulting Physician (Radiation Oncology) [name], MD as Consulting Physician (Cardiothoracic Surgery) Karie Mainland, RD as Dietitian (Nutrition) Minus Breeding, MD as Consulting Physician (Cardiology)   Date of Service:  07/26/2017    CHIEF COMPLAINTS:  Follow up metastatic esophageal cancer    Oncology History   Presented with solid dysphagia at least daily with chest pain. Involuntary weight loss of 10-12 lb over past year  Malignant neoplasm of lower third of esophagus (HCC)   Staging form: Esophagus - Adenocarcinoma, AJCC 7th Edition   - Clinical stage from 12/15/2015: Stage IIIA (T3, N1, M0) - Signed by Truitt Merle, MD on 12/24/2015   - Pathologic stage from 04/26/2016: yT0, N0, cM0, GX - Signed by [name], MD on 04/27/2016      Malignant neoplasm of lower third of esophagus ([place])   11/23/2015 Procedure    EGD per [facility](Dr. Toledo):6cm mass in lower third of esophagus      11/24/2015 Initial Diagnosis    Esophageal cancer ([place])      11/24/2015 Pathology Results    Mucinous adenocarcinoma, moderately differentiated-Her2 analysis pending      12/05/2015 Imaging    PET scan showed hypermetabolic a mass in distal esophagus, hypermetabolic activity in the left anterior prostate gland, metabolic density in left adrenal nodule, CT attenuation suggest a benign adrenal adenoma. Left apical 2 mm lung nodule.      12/15/2015 Procedure    EUS showed a uT3N1 lesion from 27cm to 35cm from the incisors       12/26/2015 - 02/02/2016 Chemotherapy    Weekly carboplatin AUC 2 and Taxol 45 mg/m, with concurrent radiation      12/26/2015 - 02/02/2016 Radiation Therapy    Neoadjuvant chemoradiation to the  esophageal cancer      04/23/2016 Surgery    total esophagectomy with cervical esophagogastrostomy, pyloroplasty, feeding jejunostomy       04/23/2016 Pathology Results    Esophagectomy showed no residual tumor, 8 lymph nodes were all negative, incidental leiomyoma 0.2 cm      08/14/2016 PET scan    IMPRESSION: 1. Small hypermetabolic left adrenal mass, this has not changed in size compared to 12/01/15 but has a maximum standard uptake value of 6.2 (previously 4.2). Indeterminate density and MRI characteristics, not specific for adenoma. Early metastatic disease is not readily excluded although the lack of change of size is somewhat reassuring. Surveillance recommended. 2. No other hypermetabolic lesions are identified. 3. Moderate to large left pleural effusion with passive atelectasis. 4. Esophagectomy with gastric pull-through. 5. No hypermetabolic liver lesions are seen. 6. Enlarged prostate gland.      08/16/2016 - 08/23/2016 Hospital Admission    He was diagnosed with DIC, unclear etiology. He was given blood products. He underwent echocardiogram which was negative for endocarditis, CTA chest/abd which was negative for aneurysm or dissection as work up for DIC. He was on prednisone for some time, but stopped as it was not helping much for DIC.       08/28/2016 - 09/01/2016 Hospital Admission    He has had issues with recurrent L sided pleural effusions, DIC and melena. He was last admitted in 1/25, given supportive care with a thoracentesis and blood transfusion. Dr Burr Medico has been following DIC  and has requested a direct admit for melena, anemia and thrombocytopenia. GI called for EGD.        08/31/2016 Pathology Results    Bone Marrow Biopsy, right iliac - METASTATIC ADENOCARCINOMA. - SEE COMMENT. PERIPHERAL BLOOD: - NORMOCYTIC ANEMIA. - THROMBOCYTOPENIA.      08/31/2016 Progression    Bone marrow biopsy showed metastatic adenocarcinoma      09/04/2016 - 06/26/2017 Chemotherapy      FOLFOX every 2 weeks; changed to maintenance 5-FU every 2 weeks starting 02/06/17. Stopped after 06/26/17 due to significant increase in CEA.       09/19/2016 Procedure    Therapeutic thoracentesis of a left pleural effusion performed on 09/19/16 yielded 1.5 liters of blood-tinged fluid. Reactive appearing mesothelial cells were present.      11/12/2016 PET scan     IMPRESSION: 1. Heterogeneous bony uptake with several mildly avid foci primarily in the lower thoracic spine, lumbar spine, and pelvic bones consistent with known bony metastatic disease. 2. There is new uptake in the right adrenal gland not seen previously with no CT correlate. Recommend attention on follow-up. 3. A rounded focus of uptake in the anterior peripheral left prostate is larger in the interval. The findings are nonspecific and could be infectious, inflammatory, or neoplastic. Focal prostatitis or prostate cancer are most likely. 4. The uptake in the left adrenal gland has decreased in the interval. 5. Mild uptake in the bilateral hila may simply represent reactive nodes. Recommend attention on follow-up. 6. The left-sided pleural effusion is a little larger in the interval with loculated components.      02/20/2017 PET scan    PET 02/20/17 IMPRESSION: 1. No activity suspicious for metastatic adenocarcinoma status post esophagectomy and gastric pull-through. 2. Stable mild prominence of the left adrenal gland and associated hypermetabolic activity from several prior studies. The lack of change implies a benign etiology. No abnormal activity within the right adrenal gland. 3. Persistent focal activity peripherally in the left lobe of the prostate, unchanged from the most recent study. Appearance is nonspecific, and could reflect prostate cancer or a benign nodule. Focal prostatitis less likely given persistence. 4. Stable loculated left pleural effusion and left basilar atelectasis. 5. These results were  called by telephone at the time of interpretation on 02/20/2017 at 1:47 pm to Dr. Truitt Merle , who verbally acknowledged these results.      05/23/2017 PET scan    PET  IMPRESSION: 1. No significant change compared to 02/19/2017. 2. No specific findings to suggest metastatic disease in this patient who is status post esophagectomy and gastric pull-through. 3. Left adrenal hypermetabolism and nodularity are unchanged over multiple prior exams, again favoring a benign etiology. 4. Left anterior prostatic hypermetabolism is similar and should be correlated with PSA and possibly prostate MRI, if not performed. 5. Left larger than right pleural effusions, with left-sided mild loculation. 6. Left nephrolithiasis. 7. Coronary artery atherosclerosis. Aortic Atherosclerosis      07/19/2017 -  Chemotherapy    Started second line therapy with Taxol on day 1,8 and 15 and ramucirumab on day 1 and 15, every 28 days starting 07/19/17       HISTORY OF PRESENTING ILLNESS (12/14/2015):  Fred [name] 78 y.o. male is here because of His newly diagnosed esophageal cancer. He is accompanied by his wife and daughter to my clinic today.  He has had dysphagia for 2 months, mainly with solid food, no dysphagia with soft food or liquid. He also has mild pain  in mid chest when he swallows. No nausea, abdominal pain, or other discomfort. He has lost about 15 lbs in the last year, no other symptoms. He was seen by his primary care physician at [facility], and referred to gastroenterologist Dr. Alice Reichert. He underwent EGD on 11/23/2015, which showed a 6 cm mass in the lower third of esophagus. Biopsy showed adenocarcinoma, moderately differentiated. He was referred to [place] to consider neoadjuvant therapy. He is scheduled to see a Psychologist, sport and exercise at [facility] later this week.  He has had some urinary frequency, underwent TURP on 11/02/2015.   CURRENT THERAPY:   Started second line therapy with Taxol on day 1,8 and 15 and  ramucirumab on day 1 and 15, every 28 days starting 07/19/17    INTERIM HISTORY:  Fred [name] returns for follow up after first dose of second-line treatment. He presents to the clinic today accompanied by his wife.  He notes some days are harder than others. He will stand up and will be SOB. In the past few days he feels very week. His feeding tube will have some bleeding from site. He changes it once a day.  He denies blood in his stool or urine or mouth. He did bleed from nose a few days ago but has not since. He is getting feeding from J tube and by mouth. He has been taking mirtazapine at night. He only takes tylenol for pain.     MEDICAL HISTORY:  Past Medical History:  Diagnosis Date  . Esophageal cancer ([place]) 11/23/15   lower 3rd esohagus   . GERD (gastroesophageal reflux disease)   . Hyperlipidemia   . Hypertension     SURGICAL HISTORY: Past Surgical History:  Procedure Laterality Date  . CHEST TUBE INSERTION Left 04/23/2016   Procedure: CHEST TUBE INSERTION;  Surgeon: [name], MD;  Location: [place];  Service: Thoracic;  Laterality: Left;  . COLONOSCOPY N/A 08/30/2016   Procedure: COLONOSCOPY;  Surgeon: Milus Banister, MD;  Location: [facility];  Service: Endoscopy;  Laterality: N/A;  . COMPLETE ESOPHAGECTOMY N/A 04/23/2016   Procedure: TRANSHIATIAL TOTAL ESOPHAGECTOMY COMPLETE; CERVICAL ESOPHAGOGASTROSTOMY AND PYLOROPLASTY;  Surgeon: [name], MD;  Location: [place];  Service: Thoracic;  Laterality: N/A;  . cystoscope  4/12/1   prostate  . ERCP    . ESOPHAGOGASTRODUODENOSCOPY (EGD) WITH PROPOFOL N/A 08/29/2016   Procedure: ESOPHAGOGASTRODUODENOSCOPY (EGD) WITH PROPOFOL;  Surgeon: Milus Banister, MD;  Location: [facility];  Service: Endoscopy;  Laterality: N/A;  . INGUINAL HERNIA REPAIR    . IR GENERIC HISTORICAL  08/31/2016   IR FLUORO GUIDE PORT INSERTION RIGHT 08/31/2016 Corrie Mckusick, DO [facility]-INTER[facility]  . IR GENERIC HISTORICAL  08/31/2016   IR US GUIDE VASC  ACCESS RIGHT 08/31/2016 Corrie Mckusick, DO [facility]  . JEJUNOSTOMY N/A 04/23/2016   Procedure: FEEDING JEJUNOSTOMY;  Surgeon: [name], MD;  Location: [place];  Service: Thoracic;  Laterality: N/A;  . LEG SURGERY Left    hole  . PROSTATE BIOPSY  10/05/15  . right shoulder surgery    . VIDEO BRONCHOSCOPY N/A 04/23/2016   Procedure: VIDEO BRONCHOSCOPY;  Surgeon: [name], MD;  Location: [facility] OR;  Service: Thoracic;  Laterality: N/A;    SOCIAL HISTORY: Social History   Socioeconomic History  . Marital status: Married    Spouse name: Not on file  . Number of children: 1  . Years of education: Not on file  . Highest education level: Not on file  Social  Needs  . Financial resource strain: Not on file  . Food insecurity - worry: Not on file  . Food insecurity - inability: Not on file  . Transportation needs - medical: Not on file  . Transportation needs - non-medical: Not on file  Occupational History  . Occupation: Truck Geophysicist/field seismologist  Tobacco Use  . Smoking status: Former Smoker    Packs/day: 1.00    Years: 10.00    Pack years: 10.00    Last attempt to quit: 07/24/1967    Years since quitting: 50.0  . Smokeless tobacco: Never Used  Substance and Sexual Activity  . Alcohol use: No  . Drug use: No  . Sexual activity: Not Currently  Other Topics Concern  . Not on file  Social History Narrative  . Not on file    FAMILY HISTORY: Family History  Problem Relation Age of Onset  . Cancer Maternal Grandfather 67       gastric cancer   . Alzheimer's disease Mother   . Diabetes Mother   . Stroke Father 36  . Multiple sclerosis Sister     ALLERGIES:  is allergic to no known allergies.  MEDICATIONS:  Current Outpatient Medications  Medication Sig Dispense Refill  . acetaminophen (TYLENOL) 325 MG tablet Take 650 mg by mouth every 6 (six) hours as needed.    Marland Kitchen atenolol (TENORMIN) 25 MG tablet Take 1 tab in the morning and half tab in the evening (Patient taking  differently: Take 12.5-25 mg by mouth 2 (two) times daily. Take '25mg'$  in the morning and 12.'5mg'$  in the evening) 60 tablet 1  . folic acid (FOLVITE) 1 MG tablet Take 1 tablet (1 mg total) by mouth daily. 30 tablet 0  . lidocaine-prilocaine (EMLA) cream Apply 1 application topically as needed. Apply 1-2 tsp over port site 1 hour prior to chemotherapy 30 g 1  . mirtazapine (REMERON) 15 MG tablet Take 1 tablet (15 mg total) by mouth at bedtime. 30 tablet 2  . Nutritional Supplements (NUTREN 1.5) LIQD 250 mLs by Jejunal Tube route at bedtime. 3 CANS VIA FEEDING TUBE    . oxycodone (OXY-IR) 5 MG capsule Take 5 mg by mouth every 6 (six) hours as needed for pain.     Marland Kitchen prochlorperazine (COMPAZINE) 10 MG tablet Take 1 tablet (10 mg total) by mouth every 6 (six) hours as needed for nausea or vomiting. 30 tablet 3  . vitamin B-12 1000 MCG tablet Take 1 tablet (1,000 mcg total) by mouth daily. 30 tablet 0  . ondansetron (ZOFRAN ODT) 8 MG disintegrating tablet Take 1 tablet (8 mg total) by mouth every 8 (eight) hours as needed for nausea or vomiting. (Patient not taking: Reported on 07/19/2017) 30 tablet 2   No current facility-administered medications for this visit.     REVIEW OF SYSTEMS:   Constitutional: Denies fevers, chills or abnormal night sweats   (+) significant fatigue and low energy (+) adequate feeding  Eyes: Denies blurriness of vision, double vision or watery eyes Ears, nose, mouth, throat, and face: Denies mucositis or sore throat  (+) occasional epistaxis Respiratory: Denies cough, dyspnea or wheezes Cardiovascular: Denies palpitation, chest discomfort or lower extremity swelling (+) mild bleeding from J tube  Gastrointestinal:  Denies heartburn or change in bowel habits Skin: Denies abnormal skin rashes Lymphatics: Denies new lymphadenopathy or easy bruising Neurological:Denies numbness, tingling or new weaknesses (+) Neuropathy, improved Behavioral/Psych: Mood is stable, no new changes    Musculoskeletal: (+) ambulates with cane All other  systems were reviewed with the patient and are negative.  PHYSICAL EXAMINATION:  ECOG PERFORMANCE STATUS: 2-3 Vitals:   07/26/17 1104  BP: (!) 94/45  Pulse: 86  Resp: 20  Temp: 97.8 F (36.6 C)  TempSrc: Oral  SpO2: 100%  Weight: 132 lb 14.4 oz (60.3 kg)  Height: '5\' 5"'$  (1.651 m)    GENERAL:alert, no distress and comfortable, in wheelchair SKIN: Appears to be pale, skin texture, turgor are normal, no rashes or significant lesions EYES: normal, conjunctiva are pink and non-injected, sclera clear OROPHARYNX:no exudate, no erythema and lips, buccal mucosa, and tongue normal  NECK: supple, thyroid normal size, non-tender, without nodularity LYMPH:  no palpable lymphadenopathy in the cervical, axillary or inguinal LUNGS: clear to auscultation and percussion with normal breathing effort HEART: regular rate & rhythm and no murmurs and no lower extremity edema ABDOMEN:abdomen soft, non-tender and normal bowel sounds, J tube in place. No skin erythema or pus. (+) Feeding tube site with old blood around feeding tube site with minimal bleeding, no discharge. No skin erythema around J tube. Normal mucosa  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  LABORATORY DATA:  I have reviewed the data as listed CBC Latest Ref Rng & Units 07/26/2017 07/24/2017 07/24/2017  WBC 4.0 - 10.3 10e3/uL 4.1 2.7(L) -  Hemoglobin 13.0 - 17.1 g/dL 8.7(L) 9.2(L) -  Hematocrit 38.4 - 49.9 % 25.7(L) 26.7(L) -  Platelets 140 - 400 10e3/uL 109(L) 83(L) 80(L)   CMP Latest Ref Rng & Units 07/24/2017 07/19/2017 06/26/2017  Glucose 70 - 140 mg/dl 115 138 106  BUN 7.0 - 26.0 mg/dL 19.5 20.8 13.2  Creatinine 0.7 - 1.3 mg/dL 0.6(L) 0.7 0.7  Sodium 136 - 145 mEq/L 131(L) 134(L) 137  Potassium 3.5 - 5.1 mEq/L 5.0 4.6 4.6  Chloride 101 - 111 mmol/L - - -  CO2  22 - 29 mEq/L '22 23 22  '$ Calcium 8.4 - 10.4 mg/dL 8.4 9.0 9.3  Total Protein 6.4 - 8.3 g/dL 5.9(L) 6.4 6.7  Total Bilirubin 0.20 - 1.20 mg/dL 1.03 2.25(H) 0.95  Alkaline Phos 40 - 150 U/L 367(H) 331(H) 238(H)  AST 5 - 34 U/L 43(H) 67(H) 34  ALT 0 - 55 U/L '23 19 15    '$ Results for KERY, BATZEL (MRN [id number]) as of 07/25/2017 18:31  Ref. Range 05/15/2017 10:40 06/12/2017 10:27 06/26/2017 09:58 07/19/2017 09:55  CEA ([facility]) Latest Ref Range: 0.00 - 5.00 ng/mL 57.08 (H) 255.29 (H) 632.11 (H) 3,138.84 (H)      Pathology report [facility] TESTING 09/29/2016   PD-L1 Testing 09/29/2016   Diagnosis 09/27/2016 Consult- Comprehensive, [id number] - 1A Mass, Lower third of Esophagus, Mucosal Biopsy - ADENOCARCINOMA WITH EXTRACELLULAR MUCIN. - SEE COMMENT. Microscopic Comment Provided Her2 IHC with the appropriate controls is negative. Per report Her2 FISH is also negative. There is limited tissue remaining, but [facility] and PDL-1 testing will be attempted as requested.  Diagnosis 09/19/2016 PLEURAL FLUID, LEFT (SPECIMEN 1 OF 1 COLLECTED 09/19/16): REACTIVE APPEARING MESOTHELIAL CELLS PRESENT.  Diagnosis 08/31/2016 Bone Marrow Biopsy, right iliac - METASTATIC ADENOCARCINOMA. - SEE COMMENT. PERIPHERAL BLOOD: - NORMOCYTIC ANEMIA. - THROMBOCYTOPENIA.  Diagnosis 08/02/16 PLEURAL FLUID, LEFT (SPECIMEN 1 OF 1 COLLECTED 08/02/16): REACTIVE MESOTHELIAL CELLS PRESENT. LYMPHOCYTES PRESENT.  Diagnosis 11/23/2015 Mass, lower third of the esophagus, mucosal biopsy Mucinous adenocarcinoma, moderately differentiated.  Diagnosis 04/23/2016 1. Lymph node, biopsy, Cervical -  ONE OF ONE LYMPH NODES NEGATIVE FOR CARCINOMA (0/1). 2. Lymph node, biopsy, Periesophageal - BLOOD WITH SCANT BENIGN SOFT TISSUE AND SKELETAL MUSCLE. - NO MALIGNANCY IDENTIFIED. 3. Esophagogastrectomy - ULCERATION WITH INFLAMMATION AND REACTIVE CHANGES. - FOCAL INTESTINAL METAPLASIA. - CHANGES CONSISTENT WITH  TREATMENT EFFECT. - SEVEN OF SEVEN LYMPH NODES NEGATIVE FOR CARCINOMA (0/7). - INCIDENTAL LEIOMYOMA, 0.2 CM. - MARGINS ARE NEGATIVE FOR DYSPLASIA OR MALIGNANCY. - SEE ONCOLOGY TABLE. Microscopic Comment 3. ESOPHAGUS: Specimen: Esophagus and proximal stomach, cervical lymph node. Procedure: Esophagogastrectomy and cervical lymph node biopsy. Tumor Site: Presumed GE junction, see comment. Relationship of Tumor to esophagogastric junction: Can not be assessed. Distance of tumor center from esophagogastric junction: Can not be assessed. Tumor Size Greatest dimension: No residual tumor present. Histologic Type: Per medical record adenocarcinoma (no biopsy for review). Histologic Grade: N/A. Microscopic Tumor Extension: N/A. Margins: Negative. Treatment Effect: Present (TRS 0). Lymph-Vascular Invasion: Not identified. Perineural Invasion: Not identified. Lymph nodes: number examined 8; number positive: 0 TNM: ypT0, ypN0 see comment. 1 of 3 FINAL for Fred [name], Fred [name] ([id number]) Microscopic Comment(continued) Ancillary studies: None. Comments: The entire GE junction is submitted for evaluation. There is ulceration with associated inflammation. There is acellular mucin/myxoid changes which extend through the muscularis propria, but no viable/residual tumor is identified and thus the stage is ypT0. There is focal background intestinal metaplasia, which may have been pre-existing (Barrett's esophagus) or related to therapy.   RADIOGRAPHIC STUDIES: I have personally reviewed the radiological images as listed and agreed with the findings in the report. Dg Chest 2 View  Result Date: 07/24/2017 CLINICAL DATA:  SOB and fatigue for 2 days, chemo treatment on Friday. HTN, Esophageal cancer may 2017. Bone marrow cancer 6 months ago. EXAM: CHEST  2 VIEW COMPARISON:  Chest x-ray dated 03/28/2017. FINDINGS: Stable left pleural effusion, moderate in size. Lungs otherwise clear. Heart size and  mediastinal contours are stable. Right chest wall Port-A-Cath remains adequately position with tip at the level of the mid SVC. No acute or suspicious osseous finding. IMPRESSION: 1. Stable left pleural effusion, moderate in size. 2. No acute findings. Electronically Signed   By: Franki Cabot M.D.   On: 07/24/2017 13:56   PET 08/14/2016 IMPRESSION: 1. Small hypermetabolic left adrenal mass, this has not changed in size compared to 12/01/15 but has a maximum standard uptake value of 6.2 (previously 4.2). Indeterminate density and MRI characteristics, not specific for adenoma. Early metastatic disease is not readily excluded although the lack of change of size is somewhat reassuring. Surveillance recommended. 2. No other hypermetabolic lesions are identified. 3. Moderate to large left pleural effusion with passive atelectasis. 4. Esophagectomy with gastric pull-through. 5. No hypermetabolic liver lesions are seen. 6. Enlarged prostate gland.  CT angio chest/abdomen/pelvis for dissection w/wo contrast 08/22/2016 IMPRESSION: No evidence of aortic aneurysm or dissection. No evidence of pulmonary embolus. Heart is borderline in size. Stable small left adrenal nodule which was hypermetabolic on prior PET CT. Stable large left pleural effusion with compressive atelectasis in the left lower lobe. Stable appearance of the gastric pull-through post esophagectomy.  US Thoracentesis asp pleural space 09/19/2016 IMPRESSION: Successful ultrasound guided diagnostic and therapeutic left thoracentesis yielding 1.5 liters of pleural fluid.  PET 11/12/2016 IMPRESSION: 1. Heterogeneous bony uptake with several mildly avid foci primarily in the lower thoracic spine, lumbar spine, and pelvic bones consistent with known bony metastatic disease. 2. There is new uptake in the right adrenal gland not seen previously with no CT correlate. Recommend attention on  follow-up. 3. A rounded focus of uptake in the  anterior peripheral left prostate is larger in the interval. The findings are nonspecific and could be infectious, inflammatory, or neoplastic. Focal prostatitis or prostate cancer are most likely. 4. The uptake in the left adrenal gland has decreased in the interval. 5. Mild uptake in the bilateral hila may simply represent reactive nodes. Recommend attention on follow-up. 6. The left-sided pleural effusion is a little larger in the interval with loculated components.    ASSESSMENT & PLAN:  77 y.o. male presented with dysphagia with solid food  1. 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 -The initial PET scan showed no definitive distant metastasis. 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 This will be followed in the future scan. -By EUS, he had T3 N1 stage III disease. -He completed neoadjuvant radiation with weekly Carbo and Taxol. -I previously reviewed his surgical pathology findings, he has had complete pathologic response to neoadjuvant chemotherapy and radiation, which predicts good prognosis -Unfortunately he developed diffuse bone metastasis in January 2018, confirmed by bone marrow biopsy -He has started first-line chemotherapy FOLFOX, tolerating well, and his anemia and thrombocytopenia has previously improved/resolved since chemotherapy -The goal of therapy is palliative -his tumor was negative for HER2 and PD-L1 expression and MSI-stable,  he is not a candidate for immunotherapy or anti-her2 therapy  -I previously reviewed his [facility] genomic testing results, unfortunately there is no good targeted therapy available. 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 I also discussed the option of maintenance Xeloda, he opted 5-fu  -We reviewed his 05/23/17 PET. Unfortunately his bone metastasis not measurable on the PET scan, and scan showed no other definitive evidence of metastatic disease.  -He unfortunately developed disease progression on maintenance therapy.  His CEA on 06/26/17 increased to 632.  Due to his neuropathy, it would be difficult to restart oxaliplatin.  Will change his treatment to second line therapy with Taxol on day 1, 8 and 15 and ramucirumab on day 1 and 15, every 28 days starting 07/19/17  - I am also concerned about neuropathy from Taxol. We will watch it carefully.  I may consider adding carboplatin to the regiment if he does not respond well or if we have to reduce his Taxol dose due to neuropathy. Potential benefits and side effects discussed with patient and his wife in detail, He is agreeable.  -He started second-line Taxol and ramucirumab on 07/19/17. He tolerated well overall.   -I discussed his DIC and how this can cause blood clots. We will closely monitor.  -Labs reviewed, Hg at 8.7, plt increased to 109K. I encouraged him to stay on treatment plan to help get his disease under control. -Given his symptomatic anemia, and the likelihood of his Hg counts will drop from Today's taxol,  will give him 1 unit of blood transfusion. He is agreeable.  -I encouraged him to contact us if he develops any significant tor unexpected side effects.  -F/u next week   2. 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 thrombocytopenia has resolved. -The patient had many blood transfusions with the last on 09/28/16, he has  not required any blood transfusion since he started chemotherapy. -He has developed a DIC again when he had cancer progression -His HG has dropped to 7.5 and plt dropped to 83K (07/19/17) -Will receive blood transfusion -I advised him to closely watch his BP   3. 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  -He has had 3 repeated thoracentesis, all cytology were negative, -Therapeutic thoracentesis performed on 09/19/16 yielded 1.5 liters of blood-tinged fluid. Reactive appearing mesothelial cells were present. -He still has small residual left pleural effusion, overall stable, -Continue monitoring, previously repeated CXR -follow-up with Dr. Servando Snare -Chest Xray from 07/24/17 showed stable left pleural effusion but not a significant amount to need a thoracentesis.   4. HTN -Continue medication and follow-up of his primary care physician -his PCP at [facility] has changed his BP meds lately   5. Malnutrition  -Doing better, he is tolerating J-tube feeding very well, he eats normally. Encouraged the patient to eat small frequent meals. -he has decreased his tube feeds lately  -Follow up with dietitian -Feeding tube site is clean and no bleeding lately  -Weight has been stable lately, he takes 3-4 cans a day in addition to eating.   6. Anorexia and depression  -continue mirtazapine. Overall, previously improved lately  -He hasn't talked to the nutritionist in a while, he will follow up   7. Fatigue  -He would like something to help his energy. -We previously discussed steroids to help for this, but I told them that this is not a long term solution -He would not like steroids at this time.  -We previously discussed fatigue being related to chemo or anemia.  -I previously encouraged him to eat well with protein and be more active   8. Goal of care discussion  -We previously discussed the incurable nature of his cancer, and the overall poor prognosis, especially if  he does not have good response to chemotherapy or progress on chemo -The patient understands the goal of care is palliative. -I recommend DNR/DNI, he will think about it   9. 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  -He has been off Oxaliplatin for 2 months now -His numbness has increased further up his hands. He denies any tingling or pain. If this progresses further I may stop 5FU -residual neuropathy still present  -Will monitor with Taxol treatment   11. Insomnia  -I recommend he increase his mirtazapine to 30 mg, he is agreeable.   Plan -1 Unit blood transfusion today with type and cross -Labs adequate to proceed with taxol today  -Lab, flush, taxol weekly x3, starting on 1/23, f/u and cyramza on 1/23 and 2/6    All questions were answered. The patient knows to call the clinic with any problems, questions or concerns.  I spent 20 minutes counseling the patient face to face. The total time spent in the appointment was 25 minutes and more than 50% was on counseling.   Krista Blue  Burr Medico, MD 07/26/2017   This document serves as a record of services personally performed by Truitt Merle, MD. It was created on her behalf by Joslyn Devon, a trained medical scribe. The creation of this record is based on the scribe's personal observations and the provider's statements to them.    I have reviewed the above documentation for accuracy and completeness, and I agree with the above.

## 2017-07-26 ENCOUNTER — Other Ambulatory Visit: Payer: No Typology Code available for payment source

## 2017-07-26 ENCOUNTER — Ambulatory Visit: Payer: No Typology Code available for payment source | Admitting: Hematology

## 2017-07-26 ENCOUNTER — Ambulatory Visit: Payer: No Typology Code available for payment source

## 2017-07-26 ENCOUNTER — Ambulatory Visit (HOSPITAL_BASED_OUTPATIENT_CLINIC_OR_DEPARTMENT_OTHER): Payer: No Typology Code available for payment source

## 2017-07-26 ENCOUNTER — Ambulatory Visit (HOSPITAL_BASED_OUTPATIENT_CLINIC_OR_DEPARTMENT_OTHER): Payer: No Typology Code available for payment source | Admitting: Hematology

## 2017-07-26 ENCOUNTER — Encounter: Payer: Self-pay | Admitting: Hematology

## 2017-07-26 ENCOUNTER — Telehealth: Payer: Self-pay | Admitting: Hematology

## 2017-07-26 ENCOUNTER — Ambulatory Visit: Payer: No Typology Code available for payment source | Admitting: Nutrition

## 2017-07-26 ENCOUNTER — Other Ambulatory Visit (HOSPITAL_BASED_OUTPATIENT_CLINIC_OR_DEPARTMENT_OTHER): Payer: No Typology Code available for payment source

## 2017-07-26 ENCOUNTER — Ambulatory Visit (HOSPITAL_COMMUNITY)
Admission: RE | Admit: 2017-07-26 | Discharge: 2017-07-26 | Disposition: A | Discharge status: Home / Self Care | Payer: Non-veteran care | Source: Ambulatory Visit | Attending: Hematology | Admitting: Hematology

## 2017-07-26 VITALS — BP 116/61 | HR 78 | Temp 98.6°F | Resp 18

## 2017-07-26 VITALS — BP 94/45 | HR 86 | Temp 97.8°F | Resp 20 | Ht 65.0 in | Wt 132.9 lb

## 2017-07-26 DIAGNOSIS — C155 Malignant neoplasm of lower third of esophagus: Secondary | ICD-10-CM

## 2017-07-26 DIAGNOSIS — I10 Essential (primary) hypertension: Secondary | ICD-10-CM | POA: Diagnosis not present

## 2017-07-26 DIAGNOSIS — R5383 Other fatigue: Secondary | ICD-10-CM | POA: Diagnosis not present

## 2017-07-26 DIAGNOSIS — D599 Acquired hemolytic anemia, unspecified: Secondary | ICD-10-CM

## 2017-07-26 DIAGNOSIS — Z95828 Presence of other vascular implants and grafts: Secondary | ICD-10-CM

## 2017-07-26 DIAGNOSIS — E46 Unspecified protein-calorie malnutrition: Secondary | ICD-10-CM

## 2017-07-26 DIAGNOSIS — C7951 Secondary malignant neoplasm of bone: Secondary | ICD-10-CM

## 2017-07-26 DIAGNOSIS — J910 Malignant pleural effusion: Secondary | ICD-10-CM

## 2017-07-26 DIAGNOSIS — G620 Drug-induced polyneuropathy: Secondary | ICD-10-CM

## 2017-07-26 DIAGNOSIS — E440 Moderate protein-calorie malnutrition: Secondary | ICD-10-CM

## 2017-07-26 DIAGNOSIS — D65 Disseminated intravascular coagulation [defibrination syndrome]: Secondary | ICD-10-CM

## 2017-07-26 DIAGNOSIS — J90 Pleural effusion, not elsewhere classified: Secondary | ICD-10-CM

## 2017-07-26 DIAGNOSIS — Z5111 Encounter for antineoplastic chemotherapy: Secondary | ICD-10-CM | POA: Diagnosis not present

## 2017-07-26 DIAGNOSIS — D696 Thrombocytopenia, unspecified: Secondary | ICD-10-CM | POA: Diagnosis not present

## 2017-07-26 LAB — CBC & DIFF AND RETIC
BASO%: 1.7 % (ref 0.0–2.0)
Basophils Absolute: 0.1 10e3/uL (ref 0.0–0.1)
EOS%: 1.9 % (ref 0.0–7.0)
Eosinophils Absolute: 0.1 10e3/uL (ref 0.0–0.5)
HCT: 25.7 % — ABNORMAL LOW (ref 38.4–49.9)
HGB: 8.7 g/dL — ABNORMAL LOW (ref 13.0–17.1)
Immature Retic Fract: 39.4 % — ABNORMAL HIGH (ref 3.00–10.60)
LYMPH%: 21.3 % (ref 14.0–49.0)
MCH: 31.5 pg (ref 27.2–33.4)
MCHC: 33.9 g/dL (ref 32.0–36.0)
MCV: 93.1 fL (ref 79.3–98.0)
MONO#: 0.6 10e3/uL (ref 0.1–0.9)
MONO%: 15 % — ABNORMAL HIGH (ref 0.0–14.0)
NEUT#: 2.5 10e3/uL (ref 1.5–6.5)
NEUT%: 60.1 % (ref 39.0–75.0)
Platelets: 109 10e3/uL — ABNORMAL LOW (ref 140–400)
RBC: 2.76 10e6/uL — ABNORMAL LOW (ref 4.20–5.82)
RDW: 17.2 % — ABNORMAL HIGH (ref 11.0–14.6)
Retic %: 4.56 % — ABNORMAL HIGH (ref 0.80–1.80)
Retic Ct Abs: 125.86 10e3/uL — ABNORMAL HIGH (ref 34.80–93.90)
WBC: 4.1 10e3/uL (ref 4.0–10.3)
lymph#: 0.9 10e3/uL (ref 0.9–3.3)

## 2017-07-26 LAB — PREPARE RBC (CROSSMATCH)

## 2017-07-26 LAB — TECHNOLOGIST REVIEW

## 2017-07-26 MED ORDER — FAMOTIDINE IN NACL 20-0.9 MG/50ML-% IV SOLN
INTRAVENOUS | Status: AC
  Filled 2017-07-26: qty 50

## 2017-07-26 MED ORDER — DIPHENHYDRAMINE HCL 50 MG/ML IJ SOLN
50.0000 mg | Freq: Once | INTRAMUSCULAR | Status: AC
  Administered 2017-07-26: 50 mg via INTRAVENOUS

## 2017-07-26 MED ORDER — SODIUM CHLORIDE 0.9% FLUSH
10.0000 mL | Freq: Once | INTRAVENOUS | Status: AC
  Administered 2017-07-26: 10 mL
  Filled 2017-07-26: qty 10

## 2017-07-26 MED ORDER — SODIUM CHLORIDE 0.9 % IV SOLN
Freq: Once | INTRAVENOUS | Status: AC
  Administered 2017-07-26: 12:00:00 via INTRAVENOUS

## 2017-07-26 MED ORDER — DIPHENHYDRAMINE HCL 50 MG/ML IJ SOLN
INTRAMUSCULAR | Status: AC
  Filled 2017-07-26: qty 1

## 2017-07-26 MED ORDER — SODIUM CHLORIDE 0.9 % IV SOLN
250.0000 mL | Freq: Once | INTRAVENOUS | Status: AC
  Administered 2017-07-26: 250 mL via INTRAVENOUS

## 2017-07-26 MED ORDER — DEXAMETHASONE SODIUM PHOSPHATE 10 MG/ML IJ SOLN
10.0000 mg | Freq: Once | INTRAMUSCULAR | Status: AC
  Administered 2017-07-26: 10 mg via INTRAVENOUS

## 2017-07-26 MED ORDER — DEXAMETHASONE SODIUM PHOSPHATE 10 MG/ML IJ SOLN
INTRAMUSCULAR | Status: AC
  Filled 2017-07-26: qty 1

## 2017-07-26 MED ORDER — SODIUM CHLORIDE 0.9 % IV SOLN
80.0000 mg/m2 | Freq: Once | INTRAVENOUS | Status: AC
  Administered 2017-07-26: 132 mg via INTRAVENOUS
  Filled 2017-07-26: qty 22

## 2017-07-26 MED ORDER — FAMOTIDINE IN NACL 20-0.9 MG/50ML-% IV SOLN
20.0000 mg | Freq: Once | INTRAVENOUS | Status: AC
  Administered 2017-07-26: 20 mg via INTRAVENOUS

## 2017-07-26 MED ORDER — SODIUM CHLORIDE 0.9 % IV SOLN: 10.0000 mg | Freq: Once | INTRAVENOUS | Status: DC

## 2017-07-26 MED ORDER — SODIUM CHLORIDE 0.9% FLUSH
10.0000 mL | INTRAVENOUS | Status: DC | PRN
  Administered 2017-07-26: 10 mL
  Filled 2017-07-26: qty 10

## 2017-07-26 MED ORDER — HEPARIN SOD (PORK) LOCK FLUSH 100 UNIT/ML IV SOLN
500.0000 [IU] | Freq: Once | INTRAVENOUS | Status: AC | PRN
  Administered 2017-07-26: 500 [IU]
  Filled 2017-07-26: qty 5

## 2017-07-26 NOTE — Progress Notes (Signed)
Did not get last set of vitals until pt was in wheelchair and ready to leave.  Pt and wife declined last set, saying they would check them at home since he already checks them twice daily.  A&Ox4.  Last set of VS stable.

## 2017-07-26 NOTE — Patient Instructions (Signed)
Implanted Port Home Guide An implanted port is a type of central line that is placed under the skin. Central lines are used to provide IV access when treatment or nutrition needs to be given through a person's veins. Implanted ports are used for long-term IV access. An implanted port may be placed because:  You need IV medicine that would be irritating to the small veins in your hands or arms.  You need long-term IV medicines, such as antibiotics.  You need IV nutrition for a long period.  You need frequent blood draws for lab tests.  You need dialysis.  Implanted ports are usually placed in the chest area, but they can also be placed in the upper arm, the abdomen, or the leg. An implanted port has two main parts:  Reservoir. The reservoir is round and will appear as a small, raised area under your skin. The reservoir is the part where a needle is inserted to give medicines or draw blood.  Catheter. The catheter is a thin, flexible tube that extends from the reservoir. The catheter is placed into a large vein. Medicine that is inserted into the reservoir goes into the catheter and then into the vein.  How will I care for my incision site? Do not get the incision site wet. Bathe or shower as directed by your health care provider. How is my port accessed? Special steps must be taken to access the port:  Before the port is accessed, a numbing cream can be placed on the skin. This helps numb the skin over the port site.  Your health care provider uses a sterile technique to access the port. ? Your health care provider must put on a mask and sterile gloves. ? The skin over your port is cleaned carefully with an antiseptic and allowed to dry. ? The port is gently pinched between sterile gloves, and a needle is inserted into the port.  Only "non-coring" port needles should be used to access the port. Once the port is accessed, a blood return should be checked. This helps ensure that the port  is in the vein and is not clogged.  If your port needs to remain accessed for a constant infusion, a clear (transparent) bandage will be placed over the needle site. The bandage and needle will need to be changed every week, or as directed by your health care provider.  Keep the bandage covering the needle clean and dry. Do not get it wet. Follow your health care provider's instructions on how to take a shower or bath while the port is accessed.  If your port does not need to stay accessed, no bandage is needed over the port.  What is flushing? Flushing helps keep the port from getting clogged. Follow your health care provider's instructions on how and when to flush the port. Ports are usually flushed with saline solution or a medicine called heparin. The need for flushing will depend on how the port is used.  If the port is used for intermittent medicines or blood draws, the port will need to be flushed: ? After medicines have been given. ? After blood has been drawn. ? As part of routine maintenance.  If a constant infusion is running, the port may not need to be flushed.  How long will my port stay implanted? The port can stay in for as long as your health care provider thinks it is needed. When it is time for the port to come out, surgery will be   done to remove it. The procedure is similar to the one performed when the port was put in. When should I seek immediate medical care? When you have an implanted port, you should seek immediate medical care if:  You notice a bad smell coming from the incision site.  You have swelling, redness, or drainage at the incision site.  You have more swelling or pain at the port site or the surrounding area.  You have a fever that is not controlled with medicine.  This information is not intended to replace advice given to you by your health care provider. Make sure you discuss any questions you have with your health care provider. Document  Released: 07/09/2005 Document Revised: 12/15/2015 Document Reviewed: 03/16/2013 [employer] Interactive Patient Education  2017 [employer]  

## 2017-07-26 NOTE — Patient Instructions (Signed)
[facility] Discharge Instructions for Patients Receiving Chemotherapy  Today you received the following chemotherapy agent: Paclitaxel (Taxol).  To help prevent nausea and vomiting after your treatment, we encourage you to take your nausea medication as directed by your doctor.   If you develop nausea and vomiting that is not controlled by your nausea medication, call the clinic.   BELOW ARE SYMPTOMS THAT SHOULD BE REPORTED IMMEDIATELY:  *FEVER GREATER THAN 100.5 F  *CHILLS WITH OR WITHOUT FEVER  NAUSEA AND VOMITING THAT IS NOT CONTROLLED WITH YOUR NAUSEA MEDICATION  *UNUSUAL SHORTNESS OF BREATH  *UNUSUAL BRUISING OR BLEEDING  TENDERNESS IN MOUTH AND THROAT WITH OR WITHOUT PRESENCE OF ULCERS  *URINARY PROBLEMS  *BOWEL PROBLEMS  UNUSUAL RASH Items with * indicate a potential emergency and should be followed up as soon as possible.  Feel free to call the clinic should you have any questions or concerns. The clinic phone number is [phone].  Please show the [facility] at check-in to the Emergency Department and triage nurse.     Blood Transfusion, Care After This sheet gives you information about how to care for yourself after your procedure. Your doctor may also give you more specific instructions. If you have problems or questions, contact your doctor. Follow these instructions at home:  Take over-the-counter and prescription medicines only as told by your doctor.  Go back to your normal activities as told by your doctor.  Follow instructions from your doctor about how to take care of the area where an IV tube was put into your vein (insertion site). Make sure you: ? Wash your hands with soap and water before you change your bandage (dressing). If there is no soap and water, use hand sanitizer. ? Change your bandage as told by your doctor.  Check your IV insertion site every day for signs of infection. Check for: ? More redness, swelling, or  pain. ? More fluid or blood. ? Warmth. ? Pus or a bad smell. Contact a doctor if:  You have more redness, swelling, or pain around the IV insertion site..  You have more fluid or blood coming from the IV insertion site.  Your IV insertion site feels warm to the touch.  You have pus or a bad smell coming from the IV insertion site.  Your pee (urine) turns pink, red, or brown.  You feel weak after doing your normal activities. Get help right away if:  You have signs of a serious allergic or body defense (immune) system reaction, including: ? Itchiness. ? Hives. ? Trouble breathing. ? Anxiety. ? Pain in your chest or lower back. ? Fever, flushing, and chills. ? Fast pulse. ? Rash. ? Watery poop (diarrhea). ? Throwing up (vomiting). ? Dark pee. ? Serious headache. ? Dizziness. ? Stiff neck. ? Yellow color in your face or the white parts of your eyes (jaundice). Summary  After a blood transfusion, return to your normal activities as told by your doctor.  Every day, check for signs of infection where the IV tube was put into your vein.  Some signs of infection are warm skin, more redness and pain, more fluid or blood, and pus or a bad smell where the needle went in.  Contact your doctor if you feel weak or have any unusual symptoms. This information is not intended to replace advice given to you by your health care provider. Make sure you discuss any questions you have with your health care provider. Document Released:  07/30/2014 Document Revised: 03/02/2016 Document Reviewed: 03/02/2016 Elsevier Interactive Patient Education  2017 [employer].

## 2017-07-26 NOTE — Progress Notes (Signed)
Nutrition follow-up completed with patient during infusion. Weight is stable and documented 132.9 January 4. Patient is using between 3 and 4 bottles of Nutren 1.5 via jejunostomy nightly. He continues to eat small amounts of food throughout the day. He has no nutrition impact symptoms other than fatigue.  Nutrition diagnosis: No Diagnosis at this time.  Will continue to check with patient as needed to ensure tube feeding is going appropriately.  Patient doesn't have questions or concerns.  **Disclaimer: This note was dictated with voice recognition software. Similar sounding words can inadvertently be transcribed and this note may contain transcription errors which may not have been corrected upon publication of note.**

## 2017-07-26 NOTE — Telephone Encounter (Signed)
Scheduled appt per 1/4 los - Patient is aware of appts added and will get a print out in the treatment area.

## 2017-07-27 LAB — TYPE AND SCREEN
ABO/RH(D): O POS
Antibody Screen: NEGATIVE
Unit division: 0

## 2017-07-27 LAB — BPAM RBC
Blood Product Expiration Date: 201901292359
ISSUE DATE / TIME: 201901041441
Unit Type and Rh: 5100

## 2017-07-31 NOTE — Progress Notes (Signed)
[place]  [phone] [phone]  Clinic Follow up Note   Patient Care Team: [name], MD as PCP - General (Internal Medicine) Truitt Merle, MD as Consulting Physician (Hematology) Kyung Rudd, MD as Consulting Physician (Radiation Oncology) [name], MD as Consulting Physician (Cardiothoracic Surgery) Karie Mainland, RD as Dietitian (Nutrition) Minus Breeding, MD as Consulting Physician (Cardiology)   Date of Service:  08/02/2017    CHIEF COMPLAINTS:  Follow up metastatic esophageal cancer    Oncology History   Presented with solid dysphagia at least daily with chest pain. Involuntary weight loss of 10-12 lb over past year  Malignant neoplasm of lower third of esophagus (HCC)   Staging form: Esophagus - Adenocarcinoma, AJCC 7th Edition   - Clinical stage from 12/15/2015: Stage IIIA (T3, N1, M0) - Signed by Truitt Merle, MD on 12/24/2015   - Pathologic stage from 04/26/2016: yT0, N0, cM0, GX - Signed by [name], MD on 04/27/2016      Malignant neoplasm of lower third of esophagus ([place])   11/23/2015 Procedure    EGD per [facility](Dr. Toledo):6cm mass in lower third of esophagus      11/24/2015 Initial Diagnosis    Esophageal cancer ([place])      11/24/2015 Pathology Results    Mucinous adenocarcinoma, moderately differentiated-Her2 analysis pending      12/05/2015 Imaging    PET scan showed hypermetabolic a mass in distal esophagus, hypermetabolic activity in the left anterior prostate gland, metabolic density in left adrenal nodule, CT attenuation suggest a benign adrenal adenoma. Left apical 2 mm lung nodule.      12/15/2015 Procedure    EUS showed a uT3N1 lesion from 27cm to 35cm from the incisors       12/26/2015 - 02/02/2016 Chemotherapy    Weekly carboplatin AUC 2 and Taxol 45 mg/m, with concurrent radiation      12/26/2015 - 02/02/2016 Radiation Therapy    Neoadjuvant chemoradiation to the  esophageal cancer      04/23/2016 Surgery    total esophagectomy with cervical esophagogastrostomy, pyloroplasty, feeding jejunostomy       04/23/2016 Pathology Results    Esophagectomy showed no residual tumor, 8 lymph nodes were all negative, incidental leiomyoma 0.2 cm      08/14/2016 PET scan    IMPRESSION: 1. Small hypermetabolic left adrenal mass, this has not changed in size compared to 12/01/15 but has a maximum standard uptake value of 6.2 (previously 4.2). Indeterminate density and MRI characteristics, not specific for adenoma. Early metastatic disease is not readily excluded although the lack of change of size is somewhat reassuring. Surveillance recommended. 2. No other hypermetabolic lesions are identified. 3. Moderate to large left pleural effusion with passive atelectasis. 4. Esophagectomy with gastric pull-through. 5. No hypermetabolic liver lesions are seen. 6. Enlarged prostate gland.      08/16/2016 - 08/23/2016 Hospital Admission    He was diagnosed with DIC, unclear etiology. He was given blood products. He underwent echocardiogram which was negative for endocarditis, CTA chest/abd which was negative for aneurysm or dissection as work up for DIC. He was on prednisone for some time, but stopped as it was not helping much for DIC.       08/28/2016 - 09/01/2016 Hospital Admission    He has had issues with recurrent L sided pleural effusions, DIC and melena. He was last admitted in 1/25, given supportive care with a thoracentesis and blood transfusion. Dr Burr Medico has been following DIC  and has requested a direct admit for melena, anemia and thrombocytopenia. GI called for EGD.        08/31/2016 Pathology Results    Bone Marrow Biopsy, right iliac - METASTATIC ADENOCARCINOMA. - SEE COMMENT. PERIPHERAL BLOOD: - NORMOCYTIC ANEMIA. - THROMBOCYTOPENIA.      08/31/2016 Progression    Bone marrow biopsy showed metastatic adenocarcinoma      09/04/2016 - 06/26/2017 Chemotherapy      FOLFOX every 2 weeks; changed to maintenance 5-FU every 2 weeks starting 02/06/17. Stopped after 06/26/17 due to significant increase in CEA.       09/19/2016 Procedure    Therapeutic thoracentesis of a left pleural effusion performed on 09/19/16 yielded 1.5 liters of blood-tinged fluid. Reactive appearing mesothelial cells were present.      11/12/2016 PET scan     IMPRESSION: 1. Heterogeneous bony uptake with several mildly avid foci primarily in the lower thoracic spine, lumbar spine, and pelvic bones consistent with known bony metastatic disease. 2. There is new uptake in the right adrenal gland not seen previously with no CT correlate. Recommend attention on follow-up. 3. A rounded focus of uptake in the anterior peripheral left prostate is larger in the interval. The findings are nonspecific and could be infectious, inflammatory, or neoplastic. Focal prostatitis or prostate cancer are most likely. 4. The uptake in the left adrenal gland has decreased in the interval. 5. Mild uptake in the bilateral hila may simply represent reactive nodes. Recommend attention on follow-up. 6. The left-sided pleural effusion is a little larger in the interval with loculated components.      02/20/2017 PET scan    PET 02/20/17 IMPRESSION: 1. No activity suspicious for metastatic adenocarcinoma status post esophagectomy and gastric pull-through. 2. Stable mild prominence of the left adrenal gland and associated hypermetabolic activity from several prior studies. The lack of change implies a benign etiology. No abnormal activity within the right adrenal gland. 3. Persistent focal activity peripherally in the left lobe of the prostate, unchanged from the most recent study. Appearance is nonspecific, and could reflect prostate cancer or a benign nodule. Focal prostatitis less likely given persistence. 4. Stable loculated left pleural effusion and left basilar atelectasis. 5. These results were  called by telephone at the time of interpretation on 02/20/2017 at 1:47 pm to Dr. Truitt Merle , who verbally acknowledged these results.      05/23/2017 PET scan    PET  IMPRESSION: 1. No significant change compared to 02/19/2017. 2. No specific findings to suggest metastatic disease in this patient who is status post esophagectomy and gastric pull-through. 3. Left adrenal hypermetabolism and nodularity are unchanged over multiple prior exams, again favoring a benign etiology. 4. Left anterior prostatic hypermetabolism is similar and should be correlated with PSA and possibly prostate MRI, if not performed. 5. Left larger than right pleural effusions, with left-sided mild loculation. 6. Left nephrolithiasis. 7. Coronary artery atherosclerosis. Aortic Atherosclerosis      07/19/2017 -  Chemotherapy    Started second line therapy with Taxol on day 1,8 and 15 and ramucirumab on day 1 and 15, every 28 days starting 07/19/17       HISTORY OF PRESENTING ILLNESS (12/14/2015):  Fred [name] 78 y.o. male is here because of His newly diagnosed esophageal cancer. He is accompanied by his wife and daughter to my clinic today.  He has had dysphagia for 2 months, mainly with solid food, no dysphagia with soft food or liquid. He also has mild pain  in mid chest when he swallows. No nausea, abdominal pain, or other discomfort. He has lost about 15 lbs in the last year, no other symptoms. He was seen by his primary care physician at [facility], and referred to gastroenterologist Dr. Alice Reichert. He underwent EGD on 11/23/2015, which showed a 6 cm mass in the lower third of esophagus. Biopsy showed adenocarcinoma, moderately differentiated. He was referred to [place] to consider neoadjuvant therapy. He is scheduled to see a Psychologist, sport and exercise at [facility] later this week.  He has had some urinary frequency, underwent TURP on 11/02/2015.   CURRENT THERAPY:   Started second line therapy with Taxol on day 1,8 and 15 and  ramucirumab on day 1 and 15, every 28 days starting 07/19/17    INTERIM HISTORY:  LUMIR DEMETRIOU returns for follow up. He presents to the clinic today accompanied by his wife. He is doing okay overall. He reports increased weakness and has SOB upon standing from a seated position. He does have a decreased appetite but notes that daily he consumes 3 8 oz bottles of Ensure and 650 mL of water. He can eat normal feed (1/2 sandwich) and is able to swallow without issue. 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 He notes that he feels more cold regularly. Pt states that he only takes his Atenolol when he feels that his pulse is up. He will take 0.5-1 tablet in the morning and at night when he feels his pulse is high.   On review of systems, pt denies trouble swallowing, abdominal pain, or any other complaints at this time. Pertinent positives are listed and detailed within the above HPI.    MEDICAL HISTORY:  Past Medical History:  Diagnosis Date  . Esophageal cancer ([place]) 11/23/15   lower 3rd esohagus   . GERD (gastroesophageal reflux disease)   . Hyperlipidemia   . Hypertension     SURGICAL HISTORY: Past Surgical History:  Procedure Laterality Date  . CHEST TUBE INSERTION Left 04/23/2016   Procedure: CHEST TUBE INSERTION;  Surgeon: [name], MD;  Location: [place];  Service: Thoracic;  Laterality: Left;  . COLONOSCOPY N/A 08/30/2016   Procedure: COLONOSCOPY;  Surgeon: Milus Banister, MD;  Location: [facility];  Service: Endoscopy;  Laterality: N/A;  . COMPLETE ESOPHAGECTOMY N/A 04/23/2016   Procedure: TRANSHIATIAL TOTAL ESOPHAGECTOMY COMPLETE; CERVICAL ESOPHAGOGASTROSTOMY AND PYLOROPLASTY;  Surgeon: [name], MD;  Location: [place];  Service: Thoracic;  Laterality: N/A;  . cystoscope  4/12/1   prostate  . ERCP    . ESOPHAGOGASTRODUODENOSCOPY (EGD) WITH PROPOFOL N/A 08/29/2016   Procedure: ESOPHAGOGASTRODUODENOSCOPY (EGD) WITH PROPOFOL;  Surgeon: Milus Banister,  MD;  Location: [facility];  Service: Endoscopy;  Laterality: N/A;  . INGUINAL HERNIA REPAIR    . IR GENERIC HISTORICAL  08/31/2016   IR FLUORO GUIDE PORT INSERTION RIGHT 08/31/2016 Corrie Mckusick, DO [facility]  . IR GENERIC HISTORICAL  08/31/2016   IR US GUIDE VASC ACCESS RIGHT 08/31/2016 Corrie Mckusick, DO [facility]  . JEJUNOSTOMY N/A 04/23/2016   Procedure: FEEDING JEJUNOSTOMY;  Surgeon: [name], MD;  Location: [place];  Service: Thoracic;  Laterality: N/A;  . LEG SURGERY Left    hole  . PROSTATE BIOPSY  10/05/15  . right shoulder surgery    . VIDEO BRONCHOSCOPY N/A 04/23/2016   Procedure: VIDEO BRONCHOSCOPY;  Surgeon: [name], MD;  Location: [place];  Service: Thoracic;  Laterality: N/A;    SOCIAL HISTORY: Social History  Socioeconomic History  . Marital status: Married    Spouse name: Not on file  . Number of children: 1  . Years of education: Not on file  . Highest education level: Not on file  Social Needs  . Financial resource strain: Not on file  . Food insecurity - worry: Not on file  . Food insecurity - inability: Not on file  . Transportation needs - medical: Not on file  . Transportation needs - non-medical: Not on file  Occupational History  . Occupation: Truck Geophysicist/field seismologist  Tobacco Use  . Smoking status: Former Smoker    Packs/day: 1.00    Years: 10.00    Pack years: 10.00    Last attempt to quit: 07/24/1967    Years since quitting: 50.0  . Smokeless tobacco: Never Used  Substance and Sexual Activity  . Alcohol use: No  . Drug use: No  . Sexual activity: Not Currently  Other Topics Concern  . Not on file  Social History Narrative  . Not on file    FAMILY HISTORY: Family History  Problem Relation Age of Onset  . Cancer Maternal Grandfather 62       gastric cancer   . Alzheimer's disease Mother   . Diabetes Mother   . Stroke Father 29  . Multiple sclerosis Sister     ALLERGIES:  is allergic to no known allergies.  MEDICATIONS:  Current  Outpatient Medications  Medication Sig Dispense Refill  . acetaminophen (TYLENOL) 325 MG tablet Take 650 mg by mouth every 6 (six) hours as needed.    Marland Kitchen atenolol (TENORMIN) 25 MG tablet Take 1 tab in the morning and half tab in the evening (Patient taking differently: Take 12.5-25 mg by mouth 2 (two) times daily. Take '25mg'$  in the morning and 12.'5mg'$  in the evening) 60 tablet 1  . folic acid (FOLVITE) 1 MG tablet Take 1 tablet (1 mg total) by mouth daily. 30 tablet 0  . lidocaine-prilocaine (EMLA) cream Apply 1 application topically as needed. Apply 1-2 tsp over port site 1 hour prior to chemotherapy 30 g 1  . mirtazapine (REMERON) 15 MG tablet Take 1 tablet (15 mg total) by mouth at bedtime. 30 tablet 2  . Nutritional Supplements (NUTREN 1.5) LIQD 250 mLs by Jejunal Tube route at bedtime. 3 CANS VIA FEEDING TUBE    . ondansetron (ZOFRAN ODT) 8 MG disintegrating tablet Take 1 tablet (8 mg total) by mouth every 8 (eight) hours as needed for nausea or vomiting. (Patient not taking: Reported on 07/19/2017) 30 tablet 2  . oxycodone (OXY-IR) 5 MG capsule Take 5 mg by mouth every 6 (six) hours as needed for pain.     Marland Kitchen prochlorperazine (COMPAZINE) 10 MG tablet Take 1 tablet (10 mg total) by mouth every 6 (six) hours as needed for nausea or vomiting. 30 tablet 3  . vitamin B-12 1000 MCG tablet Take 1 tablet (1,000 mcg total) by mouth daily. 30 tablet 0   No current facility-administered medications for this visit.    Facility-Administered Medications Ordered in Other Visits  Medication Dose Route Frequency Provider Last Rate Last Dose  . heparin lock flush 100 unit/mL  500 Units Intracatheter Once PRN Truitt Merle, MD      . PACLitaxel (TAXOL) 132 mg in sodium chloride 0.9 % 250 mL chemo infusion (</= '80mg'$ /m2)  80 mg/m2 (Treatment Plan Recorded) Intravenous Once Truitt Merle, MD 272 mL/hr at 08/02/17 1420 132 mg at 08/02/17 1420  . sodium chloride flush (NS) 0.9 %  injection 10 mL  10 mL Intracatheter PRN Truitt Merle, MD        REVIEW OF SYSTEMS:   Constitutional: Denies fevers, chills or abnormal night sweats   (+) significant fatigue and low energy (+) adequate feeding (+) appetite  Eyes: Denies blurriness of vision, double vision or watery eyes Ears, nose, mouth, throat, and face: Denies mucositis or sore throat  (+) occasional epistaxis Respiratory: Denies cough, dyspnea or wheezes Cardiovascular: Denies palpitation, chest discomfort or lower extremity swelling (+) mild bleeding from J tube  Gastrointestinal:  Denies heartburn or change in bowel habits Skin: Denies abnormal skin rashes Lymphatics: Denies new lymphadenopathy or easy bruising Neurological:Denies numbness, tingling or new weaknesses (+) Neuropathy, improved Behavioral/Psych: Mood is stable, no new changes  Musculoskeletal: (+) ambulates with cane All other systems were reviewed with the patient and are negative.  PHYSICAL EXAMINATION:  ECOG PERFORMANCE STATUS: 2-3 Vitals:   08/02/17 1040  BP: (!) 97/55  Pulse: 85  Resp: 18  Temp: 98.6 F (37 C)  TempSrc: Oral  SpO2: 100%  Weight: 131 lb 12.8 oz (59.8 kg)  Height: '5\' 5"'$  (1.651 m)    GENERAL:alert, no distress and comfortable, in wheelchair SKIN: Appears to be pale, skin texture, turgor are normal, no rashes or significant lesions EYES: normal, conjunctiva are pink and non-injected, sclera clear OROPHARYNX:no exudate, no erythema and lips, buccal mucosa, and tongue normal  NECK: supple, thyroid normal size, non-tender, without nodularity LYMPH:  no palpable lymphadenopathy in the cervical, axillary or inguinal LUNGS: clear to auscultation and percussion with normal breathing effort HEART: regular rate & rhythm and no murmurs and no lower extremity edema ABDOMEN:abdomen soft, non-tender and normal bowel sounds, J tube in place. No skin erythema or pus. (+) Feeding tube site with old blood around feeding tube site with minimal bleeding, no discharge. No skin erythema  around J tube. Normal mucosa  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  LABORATORY DATA:  I have reviewed the data as listed CBC Latest Ref Rng & Units 08/02/2017 07/26/2017 07/24/2017  WBC 4.0 - 10.3 10e3/uL - 4.1 2.7(L)  Hemoglobin 13.0 - 17.1 g/dL - 8.7(L) 9.2(L)  Hematocrit 38.4 - 49.9 % 28.6(L) 25.7(L) 26.7(L)  Platelets 140 - 400 10e3/uL - 109(L) 83(L)   CMP Latest Ref Rng & Units 08/02/2017 07/24/2017 07/19/2017  Glucose 70 - 140 mg/dL 116 115 138  BUN 7 - 26 mg/dL 15 19.5 20.8  Creatinine 0.70 - 1.30 mg/dL 0.67(L) 0.6(L) 0.7  Sodium 136 - 145 mmol/L 133(L) 131(L) 134(L)  Potassium 3.5 - 5.1 mmol/L 4.8 5.0 4.6  Chloride 98 - 109 mmol/L 104 - -  CO2 22 - 29 mmol/L 21(L) 22 23  Calcium 8.4 - 10.4 mg/dL 8.3(L) 8.4 9.0  Total Protein 6.4 - 8.3 g/dL 5.9(L) 5.9(L) 6.4  Total Bilirubin 0.2 - 1.2 mg/dL 0.7 1.03 2.25(H)  Alkaline Phos 40 - 150 U/L 666(H) 367(H) 331(H)  AST 5 - 34 U/L 41(H) 43(H) 67(H)  ALT 0 - 55 U/L '15 23 19    '$ Results for CASSIEL, FERNANDEZ (MRN [id number]) as of 07/25/2017 18:31  Ref. Range 05/15/2017 10:40 06/12/2017 10:27 06/26/2017 09:58 07/19/2017 09:55  CEA ([facility]) Latest Ref Range: 0.00 - 5.00 ng/mL 57.08 (H) 255.29 (H) 632.11 (H) 3,138.84 (H)      Pathology report [facility] TEST[facility] 09/29/2016   PD-L1 Testing  09/29/2016   Diagnosis 09/27/2016 Consult- Comprehensive, [phone] - 1A Mass, Lower third of Esophagus, Mucosal Biopsy - ADENOCARCINOMA WITH EXTRACELLULAR MUCIN. - SEE COMMENT. Microscopic Comment Provided Her2 IHC with the appropriate controls is negative. Per report Her2 FISH is also negative. There is limited tissue remaining, but [facility] and PDL-1 testing will be attempted as requested.  Diagnosis 09/19/2016 PLEURAL FLUID, LEFT (SPECIMEN 1 OF 1 COLLECTED 09/19/16): REACTIVE APPEARING MESOTHELIAL CELLS  PRESENT.  Diagnosis 08/31/2016 Bone Marrow Biopsy, right iliac - METASTATIC ADENOCARCINOMA. - SEE COMMENT. PERIPHERAL BLOOD: - NORMOCYTIC ANEMIA. - THROMBOCYTOPENIA.  Diagnosis 08/02/16 PLEURAL FLUID, LEFT (SPECIMEN 1 OF 1 COLLECTED 08/02/16): REACTIVE MESOTHELIAL CELLS PRESENT. LYMPHOCYTES PRESENT.  Diagnosis 11/23/2015 Mass, lower third of the esophagus, mucosal biopsy Mucinous adenocarcinoma, moderately differentiated.  Diagnosis 04/23/2016 1. Lymph node, biopsy, Cervical - ONE OF ONE LYMPH NODES NEGATIVE FOR CARCINOMA (0/1). 2. Lymph node, biopsy, Periesophageal - BLOOD WITH SCANT BENIGN SOFT TISSUE AND SKELETAL MUSCLE. - NO MALIGNANCY IDENTIFIED. 3. Esophagogastrectomy - ULCERATION WITH INFLAMMATION AND REACTIVE CHANGES. - FOCAL INTESTINAL METAPLASIA. - CHANGES CONSISTENT WITH TREATMENT EFFECT. - SEVEN OF SEVEN LYMPH NODES NEGATIVE FOR CARCINOMA (0/7). - INCIDENTAL LEIOMYOMA, 0.2 CM. - MARGINS ARE NEGATIVE FOR DYSPLASIA OR MALIGNANCY. - SEE ONCOLOGY TABLE. Microscopic Comment 3. ESOPHAGUS: Specimen: Esophagus and proximal stomach, cervical lymph node. Procedure: Esophagogastrectomy and cervical lymph node biopsy. Tumor Site: Presumed GE junction, see comment. Relationship of Tumor to esophagogastric junction: Can not be assessed. Distance of tumor center from esophagogastric junction: Can not be assessed. Tumor Size Greatest dimension: No residual tumor present. Histologic Type: Per medical record adenocarcinoma (no biopsy for review). Histologic Grade: N/A. Microscopic Tumor Extension: N/A. Margins: Negative. Treatment Effect: Present (TRS 0). Lymph-Vascular Invasion: Not identified. Perineural Invasion: Not identified. Lymph nodes: number examined 8; number positive: 0 TNM: ypT0, ypN0 see comment. 1 of 3 FINAL for DIMITRI, [place] ([id number]) Microscopic Comment(continued) Ancillary studies: None. Comments: The entire GE junction is submitted for evaluation.  There is ulceration with associated inflammation. There is acellular mucin/myxoid changes which extend through the muscularis propria, but no viable/residual tumor is identified and thus the stage is ypT0. There is focal background intestinal metaplasia, which may have been pre-existing (Barrett's esophagus) or related to therapy.   RADIOGRAPHIC STUDIES: I have personally reviewed the radiological images as listed and agreed with the findings in the report. Dg Chest 2 View  Result Date: 07/24/2017 CLINICAL DATA:  SOB and fatigue for 2 days, chemo treatment on Friday. HTN, Esophageal cancer may 2017. Bone marrow cancer 6 months ago. EXAM: CHEST  2 VIEW COMPARISON:  Chest x-ray dated 03/28/2017. FINDINGS: Stable left pleural effusion, moderate in size. Lungs otherwise clear. Heart size and mediastinal contours are stable. Right chest wall Port-A-Cath remains adequately position with tip at the level of the mid SVC. No acute or suspicious osseous finding. IMPRESSION: 1. Stable left pleural effusion, moderate in size. 2. No acute findings. Electronically Signed   By: Franki Cabot M.D.   On: 07/24/2017 13:56   PET 08/14/2016 IMPRESSION: 1. Small hypermetabolic left adrenal mass, this has not changed in size compared to 12/01/15 but has a maximum standard uptake value of 6.2 (previously 4.2). Indeterminate density and MRI characteristics, not specific for adenoma. Early metastatic disease is not readily excluded although the lack of change of size is somewhat reassuring. Surveillance recommended. 2. No other hypermetabolic lesions are identified. 3. Moderate to large left pleural effusion with passive atelectasis. 4. Esophagectomy with gastric pull-through. 5. No hypermetabolic liver  lesions are seen. 6. Enlarged prostate gland.  CT angio chest/abdomen/pelvis for dissection w/wo contrast 08/22/2016 IMPRESSION: No evidence of aortic aneurysm or dissection. No evidence of pulmonary embolus. Heart is  borderline in size. Stable small left adrenal nodule which was hypermetabolic on prior PET CT. Stable large left pleural effusion with compressive atelectasis in the left lower lobe. Stable appearance of the gastric pull-through post esophagectomy.  US Thoracentesis asp pleural space 09/19/2016 IMPRESSION: Successful ultrasound guided diagnostic and therapeutic left thoracentesis yielding 1.5 liters of pleural fluid.  PET 11/12/2016 IMPRESSION: 1. Heterogeneous bony uptake with several mildly avid foci primarily in the lower thoracic spine, lumbar spine, and pelvic bones consistent with known bony metastatic disease. 2. There is new uptake in the right adrenal gland not seen previously with no CT correlate. Recommend attention on follow-up. 3. A rounded focus of uptake in the anterior peripheral left prostate is larger in the interval. The findings are nonspecific and could be infectious, inflammatory, or neoplastic. Focal prostatitis or prostate cancer are most likely. 4. The uptake in the left adrenal gland has decreased in the interval. 5. Mild uptake in the bilateral hila may simply represent reactive nodes. Recommend attention on follow-up. 6. The left-sided pleural effusion is a little larger in the interval with loculated components.    ASSESSMENT & PLAN:  77 y.o. male presented with dysphagia with solid food  1. 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 -The initial PET scan showed no definitive distant metastasis. 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 This will be followed in the future scan. -By EUS, he had T3 N1 stage III disease. -He completed neoadjuvant radiation with weekly Carbo and Taxol. -I  previously reviewed his surgical pathology findings, he has had complete pathologic response to neoadjuvant chemotherapy and radiation, which predicts good prognosis -Unfortunately he developed diffuse bone metastasis in January 2018, confirmed by bone marrow biopsy -He has started first-line chemotherapy FOLFOX, tolerating well, and his anemia and thrombocytopenia has previously improved/resolved since chemotherapy -The goal of therapy is palliative -his tumor was negative for HER2 and PD-L1 expression and MSI-stable,  he is not a candidate for immunotherapy or anti-her2 therapy  -I previously reviewed his [facility] genomic testing results, unfortunately there is no good targeted therapy available. 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 I also discussed the option of maintenance Xeloda, he opted 5-fu  -We reviewed his 05/23/17 PET. Unfortunately his bone metastasis not measurable on the PET scan, and scan showed no other definitive evidence of metastatic disease.  -He unfortunately developed disease progression on maintenance therapy.  His CEA on 06/26/17 increased to 632.  Due to his neuropathy, it would be difficult to restart oxaliplatin.  Will change his treatment to second line therapy with Taxol on day 1, 8 and 15 and ramucirumab on day 1 and 15, every 28 days starting 07/19/17  - I am also concerned about neuropathy from Taxol. We will watch it carefully.  I may consider adding carboplatin to the regiment if he does  not respond well or if we have to reduce his Taxol dose due to neuropathy. Potential benefits and side effects discussed with patient and his wife in detail, He is agreeable.  -He started second-line  Taxol and ramucirumab on 07/19/17. He tolerated well overall.   -Labs reviewed, Hg at 8.7, plt increased to 109K as of 07/26/17. I encouraged him to stay on treatment plan to help get his disease under control. bilirubin came down to normal 0.7 today. (1//11/19), his DIC has improved  -I encouraged pt to be more active including moving around in his wheelchair to prevent muscle deterioration -Pt is due for quite fatigued, lab reviewed, adequate to continue with treatment today. - I encouraged pt to apply pressure to feeding tube site when there is bleeding  -I encouraged him to contact us if he develops any significant tor unexpected side effects.    2. 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 thrombocytopenia has resolved. -The patient had many blood transfusions with the last on 09/28/16, he has not required any blood transfusion since he started chemotherapy. -He has developed a DIC again when he had cancer progression -Since he started chemotherapy, his DIC has improved -His Hgb is 9.8 today (08/02/17)  3. 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  -He has had 3 repeated thoracentesis, all cytology were negative, -Therapeutic thoracentesis performed on 09/19/16 yielded 1.5 liters of blood-tinged fluid. Reactive appearing mesothelial cells were present. -He still has small residual left pleural effusion, overall stable, -Continue monitoring, previously repeated CXR -follow-up with Dr. Servando Snare -Chest Xray from 07/24/17 showed stable left pleural effusion but not a significant amount to need a thoracentesis.  -Pt's effusion is also contributing to his SOB  4. HTN -Continue medication and follow-up of his primary care physician -his PCP at [facility] has changed his BP meds lately  -I encouraged pt to take 0.5-1 tablet of atenolol daily because chemo can raise BP.   5.  Malnutrition  -Doing better, he is tolerating J-tube feeding very well, he eats normally. Encouraged the patient to eat small frequent meals. -he has decreased his tube feeds lately  -Follow up with dietitian -Feeding tube site is clean and no bleeding lately  -Weight has been stable lately, he takes 3-4 cans a day in addition to eating.  -Continue to follow up with nutritionist at the [place]  6. Anorexia and depression  -continue mirtazapine. Overall, previously improved lately  -He hasn't talked to the nutritionist in a while, he will follow up   7. Fatigue  -He would like something to help his energy. -We previously discussed steroids to help for this, but I told them that this is not a long term solution -He would not like steroids at this time.  -We previously discussed fatigue being related to chemo or anemia.  -I previously encouraged him to eat well with protein and be more active   8. Goal of care discussion  -We previously discussed the incurable nature of his cancer, and the overall poor prognosis, especially if he does not have good response to chemotherapy or progress on chemo -The patient understands the goal of care is palliative. -I recommend DNR/DNI, he will think about it   9. 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  -He has been off Oxaliplatin for 2 months now -His numbness has increased further up his hands. He denies any tingling or pain. If this progresses further I may stop 5FU -residual neuropathy still present  -Will continue to monitor with Taxol treatment   11. Insomnia  -I previously recommend he increase his  mirtazapine to 30 mg, he is agreeable.   Plan Continue with treatment today, C1D15 Taxol and Cyramza F/u in 2 weeks to start cycle 2  No need blood transfusion for now   All questions were answered. The patient knows to call the clinic with any problems, questions or concerns.  I spent 20 minutes counseling the patient face to face. The total time spent in the appointment was 25 minutes and more than 50% was on counseling.   Truitt Merle, MD 08/02/2017   This document serves as a record of services personally performed by Truitt Merle, MD. It was created on her behalf by Theresia Bough, a trained medical scribe. The creation of this record is based on the scribe's personal observations and the provider's statements to them.   I have reviewed the above documentation for accuracy and completeness, and I agree with the above.

## 2017-08-02 ENCOUNTER — Telehealth: Payer: Self-pay | Admitting: *Deleted

## 2017-08-02 ENCOUNTER — Inpatient Hospital Stay: Payer: Non-veteran care

## 2017-08-02 ENCOUNTER — Inpatient Hospital Stay: Payer: Non-veteran care | Attending: Hematology | Admitting: Hematology

## 2017-08-02 ENCOUNTER — Other Ambulatory Visit: Payer: Self-pay | Admitting: Hematology

## 2017-08-02 ENCOUNTER — Ambulatory Visit: Payer: No Typology Code available for payment source

## 2017-08-02 ENCOUNTER — Ambulatory Visit: Payer: No Typology Code available for payment source | Admitting: Hematology

## 2017-08-02 ENCOUNTER — Other Ambulatory Visit: Payer: No Typology Code available for payment source

## 2017-08-02 ENCOUNTER — Encounter: Payer: Self-pay | Admitting: Hematology

## 2017-08-02 VITALS — BP 97/55 | HR 85 | Temp 98.6°F | Resp 18 | Ht 65.0 in | Wt 131.8 lb

## 2017-08-02 DIAGNOSIS — Z5111 Encounter for antineoplastic chemotherapy: Secondary | ICD-10-CM | POA: Insufficient documentation

## 2017-08-02 DIAGNOSIS — R531 Weakness: Secondary | ICD-10-CM

## 2017-08-02 DIAGNOSIS — R630 Anorexia: Secondary | ICD-10-CM | POA: Diagnosis not present

## 2017-08-02 DIAGNOSIS — Z95828 Presence of other vascular implants and grafts: Secondary | ICD-10-CM

## 2017-08-02 DIAGNOSIS — C155 Malignant neoplasm of lower third of esophagus: Secondary | ICD-10-CM

## 2017-08-02 DIAGNOSIS — E46 Unspecified protein-calorie malnutrition: Secondary | ICD-10-CM | POA: Insufficient documentation

## 2017-08-02 DIAGNOSIS — T451X5S Adverse effect of antineoplastic and immunosuppressive drugs, sequela: Secondary | ICD-10-CM

## 2017-08-02 DIAGNOSIS — Z87891 Personal history of nicotine dependence: Secondary | ICD-10-CM | POA: Diagnosis not present

## 2017-08-02 DIAGNOSIS — K219 Gastro-esophageal reflux disease without esophagitis: Secondary | ICD-10-CM

## 2017-08-02 DIAGNOSIS — Z931 Gastrostomy status: Secondary | ICD-10-CM | POA: Diagnosis not present

## 2017-08-02 DIAGNOSIS — R1319 Other dysphagia: Secondary | ICD-10-CM | POA: Diagnosis not present

## 2017-08-02 DIAGNOSIS — D65 Disseminated intravascular coagulation [defibrination syndrome]: Secondary | ICD-10-CM

## 2017-08-02 DIAGNOSIS — Z9221 Personal history of antineoplastic chemotherapy: Secondary | ICD-10-CM | POA: Insufficient documentation

## 2017-08-02 DIAGNOSIS — C7951 Secondary malignant neoplasm of bone: Secondary | ICD-10-CM | POA: Diagnosis not present

## 2017-08-02 DIAGNOSIS — D6959 Other secondary thrombocytopenia: Secondary | ICD-10-CM | POA: Insufficient documentation

## 2017-08-02 DIAGNOSIS — Z79899 Other long term (current) drug therapy: Secondary | ICD-10-CM | POA: Insufficient documentation

## 2017-08-02 DIAGNOSIS — D592 Drug-induced nonautoimmune hemolytic anemia: Secondary | ICD-10-CM | POA: Insufficient documentation

## 2017-08-02 DIAGNOSIS — E785 Hyperlipidemia, unspecified: Secondary | ICD-10-CM

## 2017-08-02 DIAGNOSIS — R5383 Other fatigue: Secondary | ICD-10-CM | POA: Diagnosis not present

## 2017-08-02 DIAGNOSIS — J90 Pleural effusion, not elsewhere classified: Secondary | ICD-10-CM | POA: Diagnosis not present

## 2017-08-02 DIAGNOSIS — N400 Enlarged prostate without lower urinary tract symptoms: Secondary | ICD-10-CM | POA: Insufficient documentation

## 2017-08-02 DIAGNOSIS — I2510 Atherosclerotic heart disease of native coronary artery without angina pectoris: Secondary | ICD-10-CM | POA: Diagnosis not present

## 2017-08-02 DIAGNOSIS — G4700 Insomnia, unspecified: Secondary | ICD-10-CM | POA: Diagnosis not present

## 2017-08-02 DIAGNOSIS — Z923 Personal history of irradiation: Secondary | ICD-10-CM | POA: Diagnosis not present

## 2017-08-02 DIAGNOSIS — R0602 Shortness of breath: Secondary | ICD-10-CM

## 2017-08-02 DIAGNOSIS — G620 Drug-induced polyneuropathy: Secondary | ICD-10-CM | POA: Insufficient documentation

## 2017-08-02 DIAGNOSIS — I10 Essential (primary) hypertension: Secondary | ICD-10-CM

## 2017-08-02 DIAGNOSIS — F418 Other specified anxiety disorders: Secondary | ICD-10-CM | POA: Diagnosis not present

## 2017-08-02 DIAGNOSIS — R530 Neoplastic (malignant) related fatigue: Secondary | ICD-10-CM | POA: Diagnosis not present

## 2017-08-02 DIAGNOSIS — Z5112 Encounter for antineoplastic immunotherapy: Secondary | ICD-10-CM | POA: Insufficient documentation

## 2017-08-02 DIAGNOSIS — F329 Major depressive disorder, single episode, unspecified: Secondary | ICD-10-CM | POA: Diagnosis not present

## 2017-08-02 DIAGNOSIS — E440 Moderate protein-calorie malnutrition: Secondary | ICD-10-CM

## 2017-08-02 LAB — COMPREHENSIVE METABOLIC PANEL
ALBUMIN: 3.3 g/dL — AB (ref 3.5–5.0)
ALK PHOS: 666 U/L — AB (ref 40–150)
ALT: 15 U/L (ref 0–55)
AST: 41 U/L — AB (ref 5–34)
Anion gap: 8 (ref 3–11)
BILIRUBIN TOTAL: 0.7 mg/dL (ref 0.2–1.2)
BUN: 15 mg/dL (ref 7–26)
CALCIUM: 8.3 mg/dL — AB (ref 8.4–10.4)
CO2: 21 mmol/L — ABNORMAL LOW (ref 22–29)
CREATININE: 0.67 mg/dL — AB (ref 0.70–1.30)
Chloride: 104 mmol/L (ref 98–109)
GFR calc Af Amer: >60 mL/min (ref >60)
GFR calc non Af Amer: >60 mL/min (ref >60)
GLUCOSE: 116 mg/dL (ref 70–140)
Potassium: 4.8 mmol/L (ref 3.5–5.1)
Sodium: 133 mmol/L — ABNORMAL LOW (ref 136–145)
TOTAL PROTEIN: 5.9 g/dL — AB (ref 6.4–8.3)

## 2017-08-02 LAB — CBC WITH DIFFERENTIAL (CANCER CENTER ONLY)
BASOS PCT: 1 %
Basophils Absolute: 0 10*3/uL (ref 0.0–0.1)
EOS ABS: 0 10*3/uL (ref 0.0–0.5)
Eosinophils Relative: 1 %
HEMATOCRIT: 28.6 % — AB (ref 38.4–49.9)
Hemoglobin: 9.8 g/dL — ABNORMAL LOW (ref 13.0–17.1)
Lymphocytes Relative: 22 %
Lymphs Abs: 0.6 10*3/uL — ABNORMAL LOW (ref 0.9–3.3)
MCH: 32 pg (ref 27.2–33.4)
MCHC: 34.4 g/dL (ref 32.0–36.0)
MCV: 93 fL (ref 79.3–98.0)
MONO ABS: 0.4 10*3/uL (ref 0.1–0.9)
MONOS PCT: 13 %
Neutro Abs: 1.8 10*3/uL (ref 1.5–6.5)
Neutrophils Relative %: 63 %
Platelet Count: 176 10*3/uL (ref 140–400)
RBC: 3.07 MIL/uL — ABNORMAL LOW (ref 4.20–5.82)
RDW: 17.8 % — AB (ref 11.0–15.6)
WBC Count: 2.8 10*3/uL — ABNORMAL LOW (ref 4.0–10.3)

## 2017-08-02 LAB — CEA (IN HOUSE-CHCC): CEA (CHCC-IN HOUSE): 5016.44 ng/mL — AB (ref 0.00–5.00)

## 2017-08-02 LAB — SAMPLE TO BLOOD BANK

## 2017-08-02 MED ORDER — SODIUM CHLORIDE 0.9 % IV SOLN
8.0000 mg/kg | Freq: Once | INTRAVENOUS | Status: AC
  Administered 2017-08-02: 500 mg via INTRAVENOUS
  Filled 2017-08-02: qty 50

## 2017-08-02 MED ORDER — DEXAMETHASONE SODIUM PHOSPHATE 10 MG/ML IJ SOLN
INTRAMUSCULAR | Status: AC
  Filled 2017-08-02: qty 1

## 2017-08-02 MED ORDER — ACETAMINOPHEN 325 MG PO TABS
650.0000 mg | ORAL_TABLET | Freq: Once | ORAL | Status: AC
  Administered 2017-08-02: 650 mg via ORAL

## 2017-08-02 MED ORDER — SODIUM CHLORIDE 0.9 % IV SOLN
80.0000 mg/m2 | Freq: Once | INTRAVENOUS | Status: AC
  Administered 2017-08-02: 132 mg via INTRAVENOUS
  Filled 2017-08-02: qty 22

## 2017-08-02 MED ORDER — DIPHENHYDRAMINE HCL 50 MG/ML IJ SOLN
50.0000 mg | Freq: Once | INTRAMUSCULAR | Status: AC
  Administered 2017-08-02: 50 mg via INTRAVENOUS

## 2017-08-02 MED ORDER — SODIUM CHLORIDE 0.9 % IV SOLN
Freq: Once | INTRAVENOUS | Status: AC
  Administered 2017-08-02: 12:00:00 via INTRAVENOUS

## 2017-08-02 MED ORDER — HEPARIN SOD (PORK) LOCK FLUSH 100 UNIT/ML IV SOLN
500.0000 [IU] | Freq: Once | INTRAVENOUS | Status: AC | PRN
  Administered 2017-08-02: 500 [IU]
  Filled 2017-08-02: qty 5

## 2017-08-02 MED ORDER — SODIUM CHLORIDE 0.9% FLUSH
10.0000 mL | INTRAVENOUS | Status: DC | PRN
  Administered 2017-08-02: 10 mL
  Filled 2017-08-02: qty 10

## 2017-08-02 MED ORDER — FAMOTIDINE IN NACL 20-0.9 MG/50ML-% IV SOLN
20.0000 mg | Freq: Once | INTRAVENOUS | Status: AC
  Administered 2017-08-02: 20 mg via INTRAVENOUS

## 2017-08-02 MED ORDER — SODIUM CHLORIDE 0.9% FLUSH
10.0000 mL | Freq: Once | INTRAVENOUS | Status: AC
  Administered 2017-08-02: 10 mL
  Filled 2017-08-02: qty 10

## 2017-08-02 MED ORDER — FAMOTIDINE IN NACL 20-0.9 MG/50ML-% IV SOLN
INTRAVENOUS | Status: AC
  Filled 2017-08-02: qty 50

## 2017-08-02 MED ORDER — SODIUM CHLORIDE 0.9 % IV SOLN: 10.0000 mg | Freq: Once | INTRAVENOUS | Status: DC

## 2017-08-02 MED ORDER — ACETAMINOPHEN 325 MG PO TABS
ORAL_TABLET | ORAL | Status: AC
  Filled 2017-08-02: qty 2

## 2017-08-02 MED ORDER — DIPHENHYDRAMINE HCL 50 MG/ML IJ SOLN
INTRAMUSCULAR | Status: AC
  Filled 2017-08-02: qty 1

## 2017-08-02 MED ORDER — DEXAMETHASONE SODIUM PHOSPHATE 10 MG/ML IJ SOLN
10.0000 mg | Freq: Once | INTRAMUSCULAR | Status: AC
  Administered 2017-08-02: 10 mg via INTRAVENOUS

## 2017-08-02 NOTE — Patient Instructions (Signed)
Implanted Port Home Guide An implanted port is a type of central line that is placed under the skin. Central lines are used to provide IV access when treatment or nutrition needs to be given through a person's veins. Implanted ports are used for long-term IV access. An implanted port may be placed because:  You need IV medicine that would be irritating to the small veins in your hands or arms.  You need long-term IV medicines, such as antibiotics.  You need IV nutrition for a long period.  You need frequent blood draws for lab tests.  You need dialysis.  Implanted ports are usually placed in the chest area, but they can also be placed in the upper arm, the abdomen, or the leg. An implanted port has two main parts:  Reservoir. The reservoir is round and will appear as a small, raised area under your skin. The reservoir is the part where a needle is inserted to give medicines or draw blood.  Catheter. The catheter is a thin, flexible tube that extends from the reservoir. The catheter is placed into a large vein. Medicine that is inserted into the reservoir goes into the catheter and then into the vein.  How will I care for my incision site? Do not get the incision site wet. Bathe or shower as directed by your health care provider. How is my port accessed? Special steps must be taken to access the port:  Before the port is accessed, a numbing cream can be placed on the skin. This helps numb the skin over the port site.  Your health care provider uses a sterile technique to access the port. ? Your health care provider must put on a mask and sterile gloves. ? The skin over your port is cleaned carefully with an antiseptic and allowed to dry. ? The port is gently pinched between sterile gloves, and a needle is inserted into the port.  Only "non-coring" port needles should be used to access the port. Once the port is accessed, a blood return should be checked. This helps ensure that the port  is in the vein and is not clogged.  If your port needs to remain accessed for a constant infusion, a clear (transparent) bandage will be placed over the needle site. The bandage and needle will need to be changed every week, or as directed by your health care provider.  Keep the bandage covering the needle clean and dry. Do not get it wet. Follow your health care provider's instructions on how to take a shower or bath while the port is accessed.  If your port does not need to stay accessed, no bandage is needed over the port.  What is flushing? Flushing helps keep the port from getting clogged. Follow your health care provider's instructions on how and when to flush the port. Ports are usually flushed with saline solution or a medicine called heparin. The need for flushing will depend on how the port is used.  If the port is used for intermittent medicines or blood draws, the port will need to be flushed: ? After medicines have been given. ? After blood has been drawn. ? As part of routine maintenance.  If a constant infusion is running, the port may not need to be flushed.  How long will my port stay implanted? The port can stay in for as long as your health care provider thinks it is needed. When it is time for the port to come out, surgery will be   done to remove it. The procedure is similar to the one performed when the port was put in. When should I seek immediate medical care? When you have an implanted port, you should seek immediate medical care if:  You notice a bad smell coming from the incision site.  You have swelling, redness, or drainage at the incision site.  You have more swelling or pain at the port site or the surrounding area.  You have a fever that is not controlled with medicine.  This information is not intended to replace advice given to you by your health care provider. Make sure you discuss any questions you have with your health care provider. Document  Released: 07/09/2005 Document Revised: 12/15/2015 Document Reviewed: 03/16/2013 [employer] Interactive Patient Education  2017 [employer]  

## 2017-08-02 NOTE — Patient Instructions (Addendum)
[facility] Discharge Instructions for Patients Receiving Chemotherapy  Today you received the following chemotherapy agent: paclitaxel (Taxol) and ramucirumab (Cyramza).  To help prevent nausea and vomiting after your treatment, we encourage you to take your nausea medication as directed by your doctor.   If you develop nausea and vomiting that is not controlled by your nausea medication, call the clinic.   BELOW ARE SYMPTOMS THAT SHOULD BE REPORTED IMMEDIATELY:  *FEVER GREATER THAN 100.5 F  *CHILLS WITH OR WITHOUT FEVER  NAUSEA AND VOMITING THAT IS NOT CONTROLLED WITH YOUR NAUSEA MEDICATION  *UNUSUAL SHORTNESS OF BREATH  *UNUSUAL BRUISING OR BLEEDING  TENDERNESS IN MOUTH AND THROAT WITH OR WITHOUT PRESENCE OF ULCERS  *URINARY PROBLEMS  *BOWEL PROBLEMS  UNUSUAL RASH Items with * indicate a potential emergency and should be followed up as soon as possible.  Feel free to call the clinic should you have any questions or concerns. The clinic phone number is [phone].  Please show the [facility] at check-in to the Emergency Department and triage nurse.

## 2017-08-02 NOTE — Telephone Encounter (Signed)
Dr. Burr Medico reviewed CEA results today.  Spoke with wife Fred [name] and informed her re:  Per Dr. Burr Medico,  Chemo is working.  CEA level will rise, and will eventually go  down with chemo treatments.  MD will continue to monitor lab work closely.   Pat voiced understanding.

## 2017-08-12 NOTE — Progress Notes (Signed)
[place]  [phone] [phone]  Clinic Follow up Note   Patient Care Team: [name], MD as PCP - General (Internal Medicine) Truitt Merle, MD as Consulting Physician (Hematology) Kyung Rudd, MD as Consulting Physician (Radiation Oncology) [name], MD as Consulting Physician (Cardiothoracic Surgery) Karie Mainland, RD as Dietitian (Nutrition) Minus Breeding, MD as Consulting Physician (Cardiology)   Date of Service:  08/14/2017    CHIEF COMPLAINTS:  Follow up metastatic esophageal cancer    Oncology History   Presented with solid dysphagia at least daily with chest pain. Involuntary weight loss of 10-12 lb over past year  Malignant neoplasm of lower third of esophagus (HCC)   Staging form: Esophagus - Adenocarcinoma, AJCC 7th Edition   - Clinical stage from 12/15/2015: Stage IIIA (T3, N1, M0) - Signed by Truitt Merle, MD on 12/24/2015   - Pathologic stage from 04/26/2016: yT0, N0, cM0, GX - Signed by [name], MD on 04/27/2016      Malignant neoplasm of lower third of esophagus ([place])   11/23/2015 Procedure    EGD per [facility](Dr. Toledo):6cm mass in lower third of esophagus      11/24/2015 Initial Diagnosis    Esophageal cancer ([place])      11/24/2015 Pathology Results    Mucinous adenocarcinoma, moderately differentiated-Her2 analysis pending      12/05/2015 Imaging    PET scan showed hypermetabolic a mass in distal esophagus, hypermetabolic activity in the left anterior prostate gland, metabolic density in left adrenal nodule, CT attenuation suggest a benign adrenal adenoma. Left apical 2 mm lung nodule.      12/15/2015 Procedure    EUS showed a uT3N1 lesion from 27cm to 35cm from the incisors       12/26/2015 - 02/02/2016 Chemotherapy    Weekly carboplatin AUC 2 and Taxol 45 mg/m, with concurrent radiation      12/26/2015 - 02/02/2016 Radiation Therapy    Neoadjuvant chemoradiation to the  esophageal cancer      04/23/2016 Surgery    total esophagectomy with cervical esophagogastrostomy, pyloroplasty, feeding jejunostomy       04/23/2016 Pathology Results    Esophagectomy showed no residual tumor, 8 lymph nodes were all negative, incidental leiomyoma 0.2 cm      08/14/2016 PET scan    IMPRESSION: 1. Small hypermetabolic left adrenal mass, this has not changed in size compared to 12/01/15 but has a maximum standard uptake value of 6.2 (previously 4.2). Indeterminate density and MRI characteristics, not specific for adenoma. Early metastatic disease is not readily excluded although the lack of change of size is somewhat reassuring. Surveillance recommended. 2. No other hypermetabolic lesions are identified. 3. Moderate to large left pleural effusion with passive atelectasis. 4. Esophagectomy with gastric pull-through. 5. No hypermetabolic liver lesions are seen. 6. Enlarged prostate gland.      08/16/2016 - 08/23/2016 Hospital Admission    He was diagnosed with DIC, unclear etiology. He was given blood products. He underwent echocardiogram which was negative for endocarditis, CTA chest/abd which was negative for aneurysm or dissection as work up for DIC. He was on prednisone for some time, but stopped as it was not helping much for DIC.       08/28/2016 - 09/01/2016 Hospital Admission    He has had issues with recurrent L sided pleural effusions, DIC and melena. He was last admitted in 1/25, given supportive care with a thoracentesis and blood transfusion. Dr Burr Medico has been following DIC  and has requested a direct admit for melena, anemia and thrombocytopenia. GI called for EGD.        08/31/2016 Pathology Results    Bone Marrow Biopsy, right iliac - METASTATIC ADENOCARCINOMA. - SEE COMMENT. PERIPHERAL BLOOD: - NORMOCYTIC ANEMIA. - THROMBOCYTOPENIA.      08/31/2016 Progression    Bone marrow biopsy showed metastatic adenocarcinoma      09/04/2016 - 06/26/2017 Chemotherapy      FOLFOX every 2 weeks; changed to maintenance 5-FU every 2 weeks starting 02/06/17. Stopped after 06/26/17 due to significant increase in CEA.       09/19/2016 Procedure    Therapeutic thoracentesis of a left pleural effusion performed on 09/19/16 yielded 1.5 liters of blood-tinged fluid. Reactive appearing mesothelial cells were present.      11/12/2016 PET scan     IMPRESSION: 1. Heterogeneous bony uptake with several mildly avid foci primarily in the lower thoracic spine, lumbar spine, and pelvic bones consistent with known bony metastatic disease. 2. There is new uptake in the right adrenal gland not seen previously with no CT correlate. Recommend attention on follow-up. 3. A rounded focus of uptake in the anterior peripheral left prostate is larger in the interval. The findings are nonspecific and could be infectious, inflammatory, or neoplastic. Focal prostatitis or prostate cancer are most likely. 4. The uptake in the left adrenal gland has decreased in the interval. 5. Mild uptake in the bilateral hila may simply represent reactive nodes. Recommend attention on follow-up. 6. The left-sided pleural effusion is a little larger in the interval with loculated components.      02/20/2017 PET scan    PET 02/20/17 IMPRESSION: 1. No activity suspicious for metastatic adenocarcinoma status post esophagectomy and gastric pull-through. 2. Stable mild prominence of the left adrenal gland and associated hypermetabolic activity from several prior studies. The lack of change implies a benign etiology. No abnormal activity within the right adrenal gland. 3. Persistent focal activity peripherally in the left lobe of the prostate, unchanged from the most recent study. Appearance is nonspecific, and could reflect prostate cancer or a benign nodule. Focal prostatitis less likely given persistence. 4. Stable loculated left pleural effusion and left basilar atelectasis. 5. These results were  called by telephone at the time of interpretation on 02/20/2017 at 1:47 pm to Dr. Truitt Merle , who verbally acknowledged these results.      05/23/2017 PET scan    PET  IMPRESSION: 1. No significant change compared to 02/19/2017. 2. No specific findings to suggest metastatic disease in this patient who is status post esophagectomy and gastric pull-through. 3. Left adrenal hypermetabolism and nodularity are unchanged over multiple prior exams, again favoring a benign etiology. 4. Left anterior prostatic hypermetabolism is similar and should be correlated with PSA and possibly prostate MRI, if not performed. 5. Left larger than right pleural effusions, with left-sided mild loculation. 6. Left nephrolithiasis. 7. Coronary artery atherosclerosis. Aortic Atherosclerosis      07/19/2017 -  Chemotherapy    Started second line therapy with Taxol on day 1,8 and 15 and ramucirumab on day 1 and 15, every 28 days starting 07/19/17       HISTORY OF PRESENTING ILLNESS (12/14/2015):  Fred [name] 78 y.o. male is here because of His newly diagnosed esophageal cancer. He is accompanied by his wife and daughter to my clinic today.  He has had dysphagia for 2 months, mainly with solid food, no dysphagia with soft food or liquid. He also has mild pain  in mid chest when he swallows. No nausea, abdominal pain, or other discomfort. He has lost about 15 lbs in the last year, no other symptoms. He was seen by his primary care physician at [facility], and referred to gastroenterologist Dr. Alice Reichert. He underwent EGD on 11/23/2015, which showed a 6 cm mass in the lower third of esophagus. Biopsy showed adenocarcinoma, moderately differentiated. He was referred to [place] to consider neoadjuvant therapy. He is scheduled to see a Psychologist, sport and exercise at [facility] later this week.  He has had some urinary frequency, underwent TURP on 11/02/2015.   CURRENT THERAPY:  second line therapy with Taxol on day 1,8 and 15 and ramucirumab on  day 1 and 15, every 28 days starting 07/19/17    INTERIM HISTORY:  Fred [name] returns for follow up. He presents to the clinic today accompanied by his wife. He is doing okay overall. He reports some SOB when he stands up and exerts himself. He does not have dizziness with this. He doesn't feel any worse since he started chemo. He does reports some discomfort with his feeding tube and every other day he notices some blood near the site. They change the bandage every day.   On review of systems, pt denies trouble swallowing, abdominal pain, or any other complaints at this time. Pertinent positives are listed and detailed within the above HPI.   MEDICAL HISTORY:  Past Medical History:  Diagnosis Date  . Esophageal cancer ([place]) 11/23/15   lower 3rd esohagus   . GERD (gastroesophageal reflux disease)   . Hyperlipidemia   . Hypertension     SURGICAL HISTORY: Past Surgical History:  Procedure Laterality Date  . CHEST TUBE INSERTION Left 04/23/2016   Procedure: CHEST TUBE INSERTION;  Surgeon: [name], MD;  Location: [place];  Service: Thoracic;  Laterality: Left;  . COLONOSCOPY N/A 08/30/2016   Procedure: COLONOSCOPY;  Surgeon: Milus Banister, MD;  Location: [facility];  Service: Endoscopy;  Laterality: N/A;  . COMPLETE ESOPHAGECTOMY N/A 04/23/2016   Procedure: TRANSHIATIAL TOTAL ESOPHAGECTOMY COMPLETE; CERVICAL ESOPHAGOGASTROSTOMY AND PYLOROPLASTY;  Surgeon: [name], MD;  Location: [place];  Service: Thoracic;  Laterality: N/A;  . cystoscope  4/12/1   prostate  . ERCP    . ESOPHAGOGASTRODUODENOSCOPY (EGD) WITH PROPOFOL N/A 08/29/2016   Procedure: ESOPHAGOGASTRODUODENOSCOPY (EGD) WITH PROPOFOL;  Surgeon: Milus Banister, MD;  Location: [facility];  Service: Endoscopy;  Laterality: N/A;  . INGUINAL HERNIA REPAIR    . IR GENERIC HISTORICAL  08/31/2016   IR FLUORO GUIDE PORT INSERTION RIGHT 08/31/2016 Corrie Mckusick, DO [facility]  . IR GENERIC HISTORICAL  08/31/2016   IR US  GUIDE VASC ACCESS RIGHT 08/31/2016 Corrie Mckusick, DO [facility]  . JEJUNOSTOMY N/A 04/23/2016   Procedure: FEEDING JEJUNOSTOMY;  Surgeon: [name], MD;  Location: [place];  Service: Thoracic;  Laterality: N/A;  . LEG SURGERY Left    hole  . PROSTATE BIOPSY  10/05/15  . right shoulder surgery    . VIDEO BRONCHOSCOPY N/A 04/23/2016   Procedure: VIDEO BRONCHOSCOPY;  Surgeon: [name], MD;  Location: [facility] OR;  Service: Thoracic;  Laterality: N/A;    SOCIAL HISTORY: Social History   Socioeconomic History  . Marital status: Married    Spouse name: Not on file  . Number of children: 1  . Years of education: Not on file  . Highest education level: Not on file  Social Needs  . Financial resource strain: Not on file  .  Food insecurity - worry: Not on file  . Food insecurity - inability: Not on file  . Transportation needs - medical: Not on file  . Transportation needs - non-medical: Not on file  Occupational History  . Occupation: Truck Geophysicist/field seismologist  Tobacco Use  . Smoking status: Former Smoker    Packs/day: 1.00    Years: 10.00    Pack years: 10.00    Last attempt to quit: 07/24/1967    Years since quitting: 50.0  . Smokeless tobacco: Never Used  Substance and Sexual Activity  . Alcohol use: No  . Drug use: No  . Sexual activity: Not Currently  Other Topics Concern  . Not on file  Social History Narrative  . Not on file    FAMILY HISTORY: Family History  Problem Relation Age of Onset  . Cancer Maternal Grandfather 38       gastric cancer   . Alzheimer's disease Mother   . Diabetes Mother   . Stroke Father 28  . Multiple sclerosis Sister     ALLERGIES:  is allergic to no known allergies.  MEDICATIONS:  Current Outpatient Medications  Medication Sig Dispense Refill  . atenolol (TENORMIN) 25 MG tablet Take 1 tab in the morning and half tab in the evening (Patient taking differently: Take 12.5-25 mg by mouth 2 (two) times daily. Take '25mg'$  in the morning and 12.'5mg'$   in the evening) 60 tablet 1  . folic acid (FOLVITE) 1 MG tablet Take 1 tablet (1 mg total) by mouth daily. 30 tablet 0  . lidocaine-prilocaine (EMLA) cream Apply 1 application topically as needed. Apply 1-2 tsp over port site 1 hour prior to chemotherapy 30 g 1  . mirtazapine (REMERON) 15 MG tablet Take 1 tablet (15 mg total) by mouth at bedtime. 30 tablet 2  . Nutritional Supplements (NUTREN 1.5) LIQD 250 mLs by Jejunal Tube route at bedtime. 3 CANS VIA FEEDING TUBE    . oxycodone (OXY-IR) 5 MG capsule Take 5 mg by mouth every 6 (six) hours as needed for pain.     Marland Kitchen prochlorperazine (COMPAZINE) 10 MG tablet Take 1 tablet (10 mg total) by mouth every 6 (six) hours as needed for nausea or vomiting. 30 tablet 3  . vitamin B-12 1000 MCG tablet Take 1 tablet (1,000 mcg total) by mouth daily. 30 tablet 0  . acetaminophen (TYLENOL) 325 MG tablet Take 650 mg by mouth every 6 (six) hours as needed.    . ondansetron (ZOFRAN ODT) 8 MG disintegrating tablet Take 1 tablet (8 mg total) by mouth every 8 (eight) hours as needed for nausea or vomiting. (Patient not taking: Reported on 07/19/2017) 30 tablet 2   No current facility-administered medications for this visit.     REVIEW OF SYSTEMS:   Constitutional: Denies fevers, chills or abnormal night sweats   (+) significant fatigue and low energy (+) adequate feeding (+) appetite  Eyes: Denies blurriness of vision, double vision or watery eyes Ears, nose, mouth, throat, and face: Denies mucositis or sore throat  (+) occasional epistaxis Respiratory: Denies cough, dyspnea or wheezes Cardiovascular: Denies palpitation, chest discomfort or lower extremity swelling (+) mild bleeding from J tube  Gastrointestinal:  Denies heartburn or change in bowel habits Skin: Denies abnormal skin rashes Lymphatics: Denies new lymphadenopathy or easy bruising Neurological:Denies numbness, tingling or new weaknesses (+) Neuropathy, improved Behavioral/Psych: Mood is stable, no  new changes  Musculoskeletal: (+) ambulates with cane All other systems were reviewed with the patient and are negative.  PHYSICAL EXAMINATION:  ECOG PERFORMANCE STATUS: 2-3 Vitals:   08/14/17 1152  BP: (!) 129/53  Pulse: 87  Resp: 17  Temp: 98.2 F (36.8 C)  TempSrc: Oral  SpO2: 100%  Weight: 133 lb 6.4 oz (60.5 kg)  Height: '5\' 5"'$  (1.651 m)    GENERAL:alert, no distress and comfortable, in wheelchair SKIN: Appears to be pale, skin texture, turgor are normal, no rashes or significant lesions EYES: normal, conjunctiva are pink and non-injected, sclera clear OROPHARYNX:no exudate, no erythema and lips, buccal mucosa, and tongue normal  NECK: supple, thyroid normal size, non-tender, without nodularity LYMPH:  no palpable lymphadenopathy in the cervical, axillary or inguinal LUNGS: clear to auscultation and percussion with normal breathing effort HEART: regular rate & rhythm and no murmurs and no lower extremity edema ABDOMEN:abdomen soft, non-tender and normal bowel sounds, J tube in place. No skin erythema or pus. (+) Feeding tube site with old blood around feeding tube site with minimal bleeding, no discharge. No skin erythema around J tube. Normal mucosa  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  LABORATORY DATA:  I have reviewed the data as listed CBC Latest Ref Rng & Units 08/14/2017 08/02/2017 07/26/2017  WBC 4.0 - 10.3 K/uL 4.8 2.8(L) 4.1  Hemoglobin 13.0 - 17.1 g/dL - - 8.7(L)  Hematocrit 38.4 - 49.9 % 28.6(L) 28.6(L) 25.7(L)  Platelets 140 - 400 K/uL 212 176 109(L)   CMP Latest Ref Rng & Units 08/14/2017 08/02/2017 07/24/2017  Glucose 70 - 140 mg/dL 107 116 115  BUN 7 - 26 mg/dL 13 15 19.5  Creatinine 0.70 - 1.30 mg/dL 0.61(L) 0.67(L) 0.6(L)  Sodium 136 - 145 mmol/L 133(L) 133(L) 131(L)  Potassium 3.5 - 5.1 mmol/L 4.6 4.8 5.0  Chloride 98 - 109 mmol/L  103 104 -  CO2 22 - 29 mmol/L 22 21(L) 22  Calcium 8.4 - 10.4 mg/dL 8.4 8.3(L) 8.4  Total Protein 6.4 - 8.3 g/dL 5.9(L) 5.9(L) 5.9(L)  Total Bilirubin 0.2 - 1.2 mg/dL 0.4 0.7 1.03  Alkaline Phos 40 - 150 U/L 381(H) 666(H) 367(H)  AST 5 - 34 U/L 24 41(H) 43(H)  ALT 0 - 55 U/L '15 15 23   '$ CEA 02/20/2017: 2.89 05/15/2017: 57.0 06/12/2017: 255 06/26/2017: 632 07/19/2017: 3138 08/02/2017: 5016   Pathology report [facility] TESTING 09/29/2016   PD-L1 Testing 09/29/2016   Diagnosis 09/27/2016 Consult- Comprehensive, [id number] - 1A Mass, Lower third of Esophagus, Mucosal Biopsy - ADENOCARCINOMA WITH EXTRACELLULAR MUCIN. - SEE COMMENT. Microscopic Comment Provided Her2 IHC with the appropriate controls is negative. Per report Her2 FISH is also negative. There is limited tissue remaining, but [facility] One and PDL-1 testing will be attempted as requested.  Diagnosis 09/19/2016 PLEURAL FLUID, LEFT (SPECIMEN 1 OF 1 COLLECTED 09/19/16): REACTIVE APPEARING MESOTHELIAL CELLS PRESENT.  Diagnosis 08/31/2016 Bone Marrow Biopsy, right iliac - METASTATIC ADENOCARCINOMA. - SEE COMMENT. PERIPHERAL BLOOD: - NORMOCYTIC ANEMIA. - THROMBOCYTOPENIA.  Diagnosis 08/02/16 PLEURAL FLUID, LEFT (SPECIMEN 1 OF 1 COLLECTED 08/02/16): REACTIVE MESOTHELIAL CELLS PRESENT. LYMPHOCYTES PRESENT.  Diagnosis 11/23/2015 Mass, lower third of the esophagus, mucosal biopsy Mucinous adenocarcinoma, moderately differentiated.  Diagnosis 04/23/2016 1. Lymph node, biopsy, Cervical - ONE OF ONE LYMPH NODES NEGATIVE FOR CARCINOMA (0/1). 2. Lymph node, biopsy, Periesophageal - BLOOD WITH SCANT BENIGN SOFT TISSUE AND SKELETAL MUSCLE. - NO MALIGNANCY IDENTIFIED. 3. Esophagogastrectomy - ULCERATION WITH INFLAMMATION AND REACTIVE CHANGES. - FOCAL INTESTINAL METAPLASIA. -  CHANGES CONSISTENT WITH TREATMENT EFFECT. - SEVEN OF SEVEN LYMPH NODES NEGATIVE FOR CARCINOMA (0/7). - INCIDENTAL LEIOMYOMA, 0.2 CM. - MARGINS ARE  NEGATIVE FOR DYSPLASIA OR MALIGNANCY. - SEE ONCOLOGY TABLE. Microscopic Comment 3. ESOPHAGUS: Specimen: Esophagus and proximal stomach, cervical lymph node. Procedure: Esophagogastrectomy and cervical lymph node biopsy. Tumor Site: Presumed GE junction, see comment. Relationship of Tumor to esophagogastric junction: Can not be assessed. Distance of tumor center from esophagogastric junction: Can not be assessed. Tumor Size Greatest dimension: No residual tumor present. Histologic Type: Per medical record adenocarcinoma (no biopsy for review). Histologic Grade: N/A. Microscopic Tumor Extension: N/A. Margins: Negative. Treatment Effect: Present (TRS 0). Lymph-Vascular Invasion: Not identified. Perineural Invasion: Not identified. Lymph nodes: number examined 8; number positive: 0 TNM: ypT0, ypN0 see comment. 1 of 3 FINAL for Fred [name], Fred [name] ([id number]) Microscopic Comment(continued) Ancillary studies: None. Comments: The entire GE junction is submitted for evaluation. There is ulceration with associated inflammation. There is acellular mucin/myxoid changes which extend through the muscularis propria, but no viable/residual tumor is identified and thus the stage is ypT0. There is focal background intestinal metaplasia, which may have been pre-existing (Barrett's esophagus) or related to therapy.   RADIOGRAPHIC STUDIES: I have personally reviewed the radiological images as listed and agreed with the findings in the report. Dg Chest 2 View  Result Date: 07/24/2017 CLINICAL DATA:  SOB and fatigue for 2 days, chemo treatment on Friday. HTN, Esophageal cancer may 2017. Bone marrow cancer 6 months ago. EXAM: CHEST  2 VIEW COMPARISON:  Chest x-ray dated 03/28/2017. FINDINGS: Stable left pleural effusion, moderate in size. Lungs otherwise clear. Heart size and mediastinal contours are stable. Right chest wall Port-A-Cath remains adequately position with tip at the level of the mid SVC. No acute  or suspicious osseous finding. IMPRESSION: 1. Stable left pleural effusion, moderate in size. 2. No acute findings. Electronically Signed   By: Franki Cabot M.D.   On: 07/24/2017 13:56   PET 08/14/2016 IMPRESSION: 1. Small hypermetabolic left adrenal mass, this has not changed in size compared to 12/01/15 but has a maximum standard uptake value of 6.2 (previously 4.2). Indeterminate density and MRI characteristics, not specific for adenoma. Early metastatic disease is not readily excluded although the lack of change of size is somewhat reassuring. Surveillance recommended. 2. No other hypermetabolic lesions are identified. 3. Moderate to large left pleural effusion with passive atelectasis. 4. Esophagectomy with gastric pull-through. 5. No hypermetabolic liver lesions are seen. 6. Enlarged prostate gland.  CT angio chest/abdomen/pelvis for dissection w/wo contrast 08/22/2016 IMPRESSION: No evidence of aortic aneurysm or dissection. No evidence of pulmonary embolus. Heart is borderline in size. Stable small left adrenal nodule which was hypermetabolic on prior PET CT. Stable large left pleural effusion with compressive atelectasis in the left lower lobe. Stable appearance of the gastric pull-through post esophagectomy.  US Thoracentesis asp pleural space 09/19/2016 IMPRESSION: Successful ultrasound guided diagnostic and therapeutic left thoracentesis yielding 1.5 liters of pleural fluid.  PET 11/12/2016 IMPRESSION: 1. Heterogeneous bony uptake with several mildly avid foci primarily in the lower thoracic spine, lumbar spine, and pelvic bones consistent with known bony metastatic disease. 2. There is new uptake in the right adrenal gland not seen previously with no CT correlate. Recommend attention on follow-up. 3. A rounded focus of uptake in the anterior peripheral left prostate is larger in the interval. The findings are nonspecific and could be infectious, inflammatory, or neoplastic. Focal  prostatitis or prostate cancer are most likely. 4. The uptake in  the left adrenal gland has decreased in the interval. 5. Mild uptake in the bilateral hila may simply represent reactive nodes. Recommend attention on follow-up. 6. The left-sided pleural effusion is a little larger in the interval with loculated components.    ASSESSMENT & PLAN:  77 y.o. male presented with dysphagia with solid food  1. 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 -The initial PET scan showed no definitive distant metastasis. 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 This will be followed in the future scan. -By EUS, he had T3 N1 stage III disease. -He completed neoadjuvant radiation with weekly Carbo and Taxol. -I previously reviewed his surgical pathology findings, he has had complete pathologic response to neoadjuvant chemotherapy and radiation, which predicts good prognosis -Unfortunately he developed diffuse bone metastasis in January 2018, confirmed by bone marrow biopsy -He has started first-line chemotherapy FOLFOX, tolerating well, and his anemia and thrombocytopenia has previously improved/resolved since chemotherapy -The goal of therapy is palliative -his tumor was negative for HER2 and PD-L1 expression and MSI-stable,  he is not a candidate for immunotherapy or anti-her2 therapy  -I previously reviewed his [facility] One genomic testing results, unfortunately there is no good targeted therapy available. 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 I also discussed the option of maintenance Xeloda, he opted 5-fu  -We reviewed his 05/23/17 PET. Unfortunately his bone metastasis not measurable on the PET scan, and scan showed no other definitive evidence of metastatic disease.  -He unfortunately developed disease progression on maintenance therapy.  His CEA on 06/26/17 increased to 632.  Due to his neuropathy, it would be difficult to restart oxaliplatin.  Will change his treatment to second line therapy with Taxol on day 1, 8 and 15 and ramucirumab on day 1 and 15, every 28 days starting 07/19/17  - He is tolerating treatment well, his anemia has stabilized, has not required blood transfusion lately.  His thrombocytopenia has also resolved.  He is likely responding to chemotherapy. -Labs reviewed today (08/14/17) His Hgb is 9.7 which is stable and his platelets have increased to 212. No need for blood transfusion -I encouraged him to contact us if he develops any significant tor unexpected side effects.  -I will follow-up clinically and by tumor marker CEA, he has not had any significant lesions on the scans, will postpone the next restaging scan to April to May if he is clinically doing well  -F/u in 2 weeks with Lacie    2. 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 thrombocytopenia has resolved. -The patient had many blood transfusions with the last on 09/28/16, he has not required any blood transfusion since he started chemotherapy. -He has developed a DIC again when he had cancer progression -Since he started chemotherapy, his DIC has improved -His Hgb is 9.8 today (08/02/17)  3. 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  -He has had 3 repeated thoracentesis, all cytology were negative, -Therapeutic thoracentesis performed  on 09/19/16  yielded 1.5 liters of blood-tinged fluid. Reactive appearing mesothelial cells were present. -He still has small residual left pleural effusion, overall stable, -Continue monitoring, previously repeated CXR -follow-up with Dr. Servando Snare -Chest Xray from 07/24/17 showed stable left pleural effusion but not a significant amount to need a thoracentesis.  -Pt's effusion is also contributing to his SOB  4. HTN -Continue medication and follow-up of his primary care physician -his PCP at [facility] has changed his BP meds lately  -I encouraged pt to take 0.5-1 tablet of atenolol daily because chemo can raise BP.  His BP is 129/53 today.   5. Malnutrition  -Doing better, he is tolerating J-tube feeding very well, he eats normally. Encouraged the patient to eat small frequent meals. -he has decreased his tube feeds lately  -Follow up with dietitian -Feeding tube site is clean and no bleeding lately  -Weight has been stable lately, he takes 3-4 cans a day in addition to eating.  -Continue to follow up with nutritionist at the [facility] -Weight is stable today (08/14/17) he is eating well  6. Anorexia and depression  -continue mirtazapine. Overall, previously improved lately  -He hasn't talked to the nutritionist in a while, he will follow up    7. Fatigue  -He would like something to help his energy. -We previously discussed steroids to help for this, but I told them that this is not a long term solution -He would not like steroids at this time.  -We previously discussed fatigue being related to chemo or anemia.  -I previously encouraged him to eat well with protein and be more active   8. Goal of care discussion  -We previously discussed the incurable nature of his cancer, and the overall poor prognosis, especially if he does not have good response to chemotherapy or progress on chemo -The patient understands the goal of care is palliative. -I recommend DNR/DNI, he will think about it   9. 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  -He has been off Oxaliplatin for 2 months now -His numbness has increased further up his hands. He denies any tingling or pain. If this progresses further I may stop 5FU -residual neuropathy still present  -Will continue to monitor with Taxol treatment   11. Insomnia  -I previously recommend he increase his mirtazapine to 30 mg, he is agreeable.   Plan Lab reviewed, adequate for treatment, will start cycle 2 Taxol and Cyramza today  F/u in 2 weeks with Lacie and 4 weeks with me  No need blood transfusion for now  All questions were answered. The patient knows to call the clinic with any problems, questions or concerns.  I spent 20 minutes counseling the patient face to face. The total time spent in the appointment was 25 minutes and more than 50% was on counseling.   Truitt Merle, MD 08/14/2017   This document serves as a record of services personally performed by Truitt Merle, MD. It was created on her behalf by Theresia Bough, a trained medical scribe. The creation of this record is based on the scribe's personal observations and the provider's statements to them.  I have reviewed the above documentation for accuracy and completeness, and I agree with the above.

## 2017-08-14 ENCOUNTER — Inpatient Hospital Stay: Payer: Non-veteran care

## 2017-08-14 ENCOUNTER — Encounter: Payer: Self-pay | Admitting: Hematology

## 2017-08-14 ENCOUNTER — Inpatient Hospital Stay (HOSPITAL_BASED_OUTPATIENT_CLINIC_OR_DEPARTMENT_OTHER): Payer: Non-veteran care | Admitting: Hematology

## 2017-08-14 VITALS — BP 129/53 | HR 87 | Temp 98.2°F | Resp 17 | Ht 65.0 in | Wt 133.4 lb

## 2017-08-14 VITALS — BP 115/65 | HR 81

## 2017-08-14 DIAGNOSIS — Z5112 Encounter for antineoplastic immunotherapy: Secondary | ICD-10-CM | POA: Diagnosis not present

## 2017-08-14 DIAGNOSIS — D65 Disseminated intravascular coagulation [defibrination syndrome]: Secondary | ICD-10-CM | POA: Diagnosis not present

## 2017-08-14 DIAGNOSIS — C7951 Secondary malignant neoplasm of bone: Secondary | ICD-10-CM

## 2017-08-14 DIAGNOSIS — K921 Melena: Secondary | ICD-10-CM | POA: Diagnosis not present

## 2017-08-14 DIAGNOSIS — C155 Malignant neoplasm of lower third of esophagus: Secondary | ICD-10-CM | POA: Diagnosis not present

## 2017-08-14 DIAGNOSIS — F329 Major depressive disorder, single episode, unspecified: Secondary | ICD-10-CM | POA: Diagnosis not present

## 2017-08-14 DIAGNOSIS — D592 Drug-induced nonautoimmune hemolytic anemia: Secondary | ICD-10-CM

## 2017-08-14 DIAGNOSIS — J90 Pleural effusion, not elsewhere classified: Secondary | ICD-10-CM | POA: Diagnosis not present

## 2017-08-14 DIAGNOSIS — Z95828 Presence of other vascular implants and grafts: Secondary | ICD-10-CM

## 2017-08-14 DIAGNOSIS — G620 Drug-induced polyneuropathy: Secondary | ICD-10-CM

## 2017-08-14 DIAGNOSIS — D6959 Other secondary thrombocytopenia: Secondary | ICD-10-CM | POA: Diagnosis not present

## 2017-08-14 DIAGNOSIS — I10 Essential (primary) hypertension: Secondary | ICD-10-CM

## 2017-08-14 DIAGNOSIS — R530 Neoplastic (malignant) related fatigue: Secondary | ICD-10-CM | POA: Diagnosis not present

## 2017-08-14 DIAGNOSIS — N400 Enlarged prostate without lower urinary tract symptoms: Secondary | ICD-10-CM

## 2017-08-14 DIAGNOSIS — E440 Moderate protein-calorie malnutrition: Secondary | ICD-10-CM | POA: Diagnosis not present

## 2017-08-14 DIAGNOSIS — R630 Anorexia: Secondary | ICD-10-CM

## 2017-08-14 LAB — CBC WITH DIFFERENTIAL (CANCER CENTER ONLY)
Basophils Absolute: 0 10*3/uL (ref 0.0–0.1)
Basophils Relative: 1 %
Eosinophils Absolute: 0 10*3/uL (ref 0.0–0.5)
Eosinophils Relative: 0 %
HCT: 28.6 % — ABNORMAL LOW (ref 38.4–49.9)
Hemoglobin: 9.7 g/dL — ABNORMAL LOW (ref 13.0–17.1)
Lymphocytes Relative: 13 %
Lymphs Abs: 0.6 10*3/uL — ABNORMAL LOW (ref 0.9–3.3)
MCH: 32 pg (ref 27.2–33.4)
MCHC: 33.9 g/dL (ref 32.0–36.0)
MCV: 94.6 fL (ref 79.3–98.0)
Monocytes Absolute: 1 10*3/uL — ABNORMAL HIGH (ref 0.1–0.9)
Monocytes Relative: 20 %
Neutro Abs: 3.2 10*3/uL (ref 1.5–6.5)
Neutrophils Relative %: 66 %
Platelet Count: 212 10*3/uL (ref 140–400)
RBC: 3.02 MIL/uL — ABNORMAL LOW (ref 4.20–5.82)
RDW: 19.9 % — ABNORMAL HIGH (ref 11.0–15.6)
Smear Review: 1
WBC Count: 4.8 10*3/uL (ref 4.0–10.3)

## 2017-08-14 LAB — COMPREHENSIVE METABOLIC PANEL WITH GFR
ALT: 15 U/L (ref 0–55)
AST: 24 U/L (ref 5–34)
Albumin: 3.2 g/dL — ABNORMAL LOW (ref 3.5–5.0)
Alkaline Phosphatase: 381 U/L — ABNORMAL HIGH (ref 40–150)
Anion gap: 8 (ref 3–11)
BUN: 13 mg/dL (ref 7–26)
CO2: 22 mmol/L (ref 22–29)
Calcium: 8.4 mg/dL (ref 8.4–10.4)
Chloride: 103 mmol/L (ref 98–109)
Creatinine, Ser: 0.61 mg/dL — ABNORMAL LOW (ref 0.70–1.30)
GFR calc Af Amer: >60 mL/min
GFR calc non Af Amer: >60 mL/min
Glucose, Bld: 107 mg/dL (ref 70–140)
Potassium: 4.6 mmol/L (ref 3.5–5.1)
Sodium: 133 mmol/L — ABNORMAL LOW (ref 136–145)
Total Bilirubin: 0.4 mg/dL (ref 0.2–1.2)
Total Protein: 5.9 g/dL — ABNORMAL LOW (ref 6.4–8.3)

## 2017-08-14 LAB — SAMPLE TO BLOOD BANK

## 2017-08-14 MED ORDER — SODIUM CHLORIDE 0.9% FLUSH
10.0000 mL | Freq: Once | INTRAVENOUS | Status: AC
  Administered 2017-08-14: 10 mL
  Filled 2017-08-14: qty 10

## 2017-08-14 MED ORDER — ACETAMINOPHEN 325 MG PO TABS
ORAL_TABLET | ORAL | Status: AC
  Filled 2017-08-14: qty 1

## 2017-08-14 MED ORDER — PACLITAXEL CHEMO INJECTION 300 MG/50ML: 80.0000 mg/m2 | Freq: Once | INTRAVENOUS | Status: DC

## 2017-08-14 MED ORDER — ACETAMINOPHEN 325 MG PO TABS
ORAL_TABLET | ORAL | Status: AC
Start: 2017-08-14 — End: 2017-08-14
  Filled 2017-08-14: qty 2

## 2017-08-14 MED ORDER — HEPARIN SOD (PORK) LOCK FLUSH 100 UNIT/ML IV SOLN
500.0000 [IU] | Freq: Once | INTRAVENOUS | Status: AC | PRN
  Administered 2017-08-14: 500 [IU]
  Filled 2017-08-14: qty 5

## 2017-08-14 MED ORDER — FAMOTIDINE IN NACL 20-0.9 MG/50ML-% IV SOLN
20.0000 mg | Freq: Once | INTRAVENOUS | Status: AC
  Administered 2017-08-14: 20 mg via INTRAVENOUS

## 2017-08-14 MED ORDER — ACETAMINOPHEN 325 MG PO TABS
650.0000 mg | ORAL_TABLET | Freq: Once | ORAL | Status: AC
  Administered 2017-08-14: 650 mg via ORAL

## 2017-08-14 MED ORDER — DEXAMETHASONE SODIUM PHOSPHATE 10 MG/ML IJ SOLN
10.0000 mg | Freq: Once | INTRAMUSCULAR | Status: AC
  Administered 2017-08-14: 10 mg via INTRAVENOUS

## 2017-08-14 MED ORDER — DEXAMETHASONE SODIUM PHOSPHATE 10 MG/ML IJ SOLN
INTRAMUSCULAR | Status: AC
  Filled 2017-08-14: qty 1

## 2017-08-14 MED ORDER — SODIUM CHLORIDE 0.9% FLUSH
10.0000 mL | INTRAVENOUS | Status: DC | PRN
  Administered 2017-08-14: 10 mL
  Filled 2017-08-14: qty 10

## 2017-08-14 MED ORDER — PACLITAXEL CHEMO INJECTION 300 MG/50ML
80.0000 mg/m2 | Freq: Once | INTRAVENOUS | Status: AC
  Administered 2017-08-14: 132 mg via INTRAVENOUS
  Filled 2017-08-14: qty 22

## 2017-08-14 MED ORDER — DIPHENHYDRAMINE HCL 50 MG/ML IJ SOLN
50.0000 mg | Freq: Once | INTRAMUSCULAR | Status: AC
  Administered 2017-08-14: 50 mg via INTRAVENOUS

## 2017-08-14 MED ORDER — SODIUM CHLORIDE 0.9 % IV SOLN
8.0000 mg/kg | Freq: Once | INTRAVENOUS | Status: AC
  Administered 2017-08-14: 500 mg via INTRAVENOUS
  Filled 2017-08-14: qty 50

## 2017-08-14 MED ORDER — DIPHENHYDRAMINE HCL 50 MG/ML IJ SOLN
INTRAMUSCULAR | Status: AC
Start: 2017-08-14 — End: 2017-08-14
  Filled 2017-08-14: qty 1

## 2017-08-14 MED ORDER — SODIUM CHLORIDE 0.9 % IV SOLN: 10.0000 mg | Freq: Once | INTRAVENOUS | Status: DC

## 2017-08-14 MED ORDER — FAMOTIDINE IN NACL 20-0.9 MG/50ML-% IV SOLN
INTRAVENOUS | Status: AC
Start: 2017-08-14 — End: 2017-08-14
  Filled 2017-08-14: qty 50

## 2017-08-14 MED ORDER — SODIUM CHLORIDE 0.9 % IV SOLN
Freq: Once | INTRAVENOUS | Status: AC
  Administered 2017-08-14: 13:00:00 via INTRAVENOUS

## 2017-08-14 NOTE — Patient Instructions (Signed)
Implanted Port Home Guide An implanted port is a type of central line that is placed under the skin. Central lines are used to provide IV access when treatment or nutrition needs to be given through a person's veins. Implanted ports are used for long-term IV access. An implanted port may be placed because:  You need IV medicine that would be irritating to the small veins in your hands or arms.  You need long-term IV medicines, such as antibiotics.  You need IV nutrition for a long period.  You need frequent blood draws for lab tests.  You need dialysis.  Implanted ports are usually placed in the chest area, but they can also be placed in the upper arm, the abdomen, or the leg. An implanted port has two main parts:  Reservoir. The reservoir is round and will appear as a small, raised area under your skin. The reservoir is the part where a needle is inserted to give medicines or draw blood.  Catheter. The catheter is a thin, flexible tube that extends from the reservoir. The catheter is placed into a large vein. Medicine that is inserted into the reservoir goes into the catheter and then into the vein.  How will I care for my incision site? Do not get the incision site wet. Bathe or shower as directed by your health care provider. How is my port accessed? Special steps must be taken to access the port:  Before the port is accessed, a numbing cream can be placed on the skin. This helps numb the skin over the port site.  Your health care provider uses a sterile technique to access the port. ? Your health care provider must put on a mask and sterile gloves. ? The skin over your port is cleaned carefully with an antiseptic and allowed to dry. ? The port is gently pinched between sterile gloves, and a needle is inserted into the port.  Only "non-coring" port needles should be used to access the port. Once the port is accessed, a blood return should be checked. This helps ensure that the port  is in the vein and is not clogged.  If your port needs to remain accessed for a constant infusion, a clear (transparent) bandage will be placed over the needle site. The bandage and needle will need to be changed every week, or as directed by your health care provider.  Keep the bandage covering the needle clean and dry. Do not get it wet. Follow your health care provider's instructions on how to take a shower or bath while the port is accessed.  If your port does not need to stay accessed, no bandage is needed over the port.  What is flushing? Flushing helps keep the port from getting clogged. Follow your health care provider's instructions on how and when to flush the port. Ports are usually flushed with saline solution or a medicine called heparin. The need for flushing will depend on how the port is used.  If the port is used for intermittent medicines or blood draws, the port will need to be flushed: ? After medicines have been given. ? After blood has been drawn. ? As part of routine maintenance.  If a constant infusion is running, the port may not need to be flushed.  How long will my port stay implanted? The port can stay in for as long as your health care provider thinks it is needed. When it is time for the port to come out, surgery will be   done to remove it. The procedure is similar to the one performed when the port was put in. When should I seek immediate medical care? When you have an implanted port, you should seek immediate medical care if:  You notice a bad smell coming from the incision site.  You have swelling, redness, or drainage at the incision site.  You have more swelling or pain at the port site or the surrounding area.  You have a fever that is not controlled with medicine.  This information is not intended to replace advice given to you by your health care provider. Make sure you discuss any questions you have with your health care provider. Document  Released: 07/09/2005 Document Revised: 12/15/2015 Document Reviewed: 03/16/2013 [employer] Interactive Patient Education  2017 [employer]  

## 2017-08-14 NOTE — Patient Instructions (Signed)
[facility] Discharge Instructions for Patients Receiving Chemotherapy  Today you received the following chemotherapy agents Taxol, Cyramza  To help prevent nausea and vomiting after your treatment, we encourage you to take your nausea medication as directed   If you develop nausea and vomiting that is not controlled by your nausea medication, call the clinic.   BELOW ARE SYMPTOMS THAT SHOULD BE REPORTED IMMEDIATELY:  *FEVER GREATER THAN 100.5 F  *CHILLS WITH OR WITHOUT FEVER  NAUSEA AND VOMITING THAT IS NOT CONTROLLED WITH YOUR NAUSEA MEDICATION  *UNUSUAL SHORTNESS OF BREATH  *UNUSUAL BRUISING OR BLEEDING  TENDERNESS IN MOUTH AND THROAT WITH OR WITHOUT PRESENCE OF ULCERS  *URINARY PROBLEMS  *BOWEL PROBLEMS  UNUSUAL RASH Items with * indicate a potential emergency and should be followed up as soon as possible.  Feel free to call the clinic should you have any questions or concerns. The clinic phone number is [phone].  Please show the CHEMO ALERT CARD at check-in to the Emergency Department and triage nurse.   

## 2017-08-21 ENCOUNTER — Inpatient Hospital Stay: Payer: Non-veteran care

## 2017-08-21 VITALS — BP 112/54 | HR 79 | Temp 97.6°F | Resp 17

## 2017-08-21 DIAGNOSIS — C155 Malignant neoplasm of lower third of esophagus: Secondary | ICD-10-CM

## 2017-08-21 DIAGNOSIS — Z5112 Encounter for antineoplastic immunotherapy: Secondary | ICD-10-CM | POA: Diagnosis not present

## 2017-08-21 LAB — CBC WITH DIFFERENTIAL (CANCER CENTER ONLY)
BASOS ABS: 0 10*3/uL (ref 0.0–0.1)
BASOS PCT: 1 %
EOS ABS: 0 10*3/uL (ref 0.0–0.5)
Eosinophils Relative: 0 %
HCT: 29.5 % — ABNORMAL LOW (ref 38.4–49.9)
Hemoglobin: 10 g/dL — ABNORMAL LOW (ref 13.0–17.1)
Lymphocytes Relative: 13 %
Lymphs Abs: 0.6 10*3/uL — ABNORMAL LOW (ref 0.9–3.3)
MCH: 31.6 pg (ref 27.2–33.4)
MCHC: 33.9 g/dL (ref 32.0–36.0)
MCV: 93.4 fL (ref 79.3–98.0)
MONO ABS: 0.5 10*3/uL (ref 0.1–0.9)
MONOS PCT: 10 %
NEUTROS ABS: 3.7 10*3/uL (ref 1.5–6.5)
Neutrophils Relative %: 76 %
Platelet Count: 189 10*3/uL (ref 140–400)
RBC: 3.16 MIL/uL — ABNORMAL LOW (ref 4.20–5.82)
RDW: 17.5 % — AB (ref 11.0–14.6)
WBC Count: 4.8 10*3/uL (ref 4.0–10.3)

## 2017-08-21 LAB — COMPREHENSIVE METABOLIC PANEL
ALBUMIN: 3.3 g/dL — AB (ref 3.5–5.0)
ALT: 13 U/L (ref 0–55)
ANION GAP: 9 (ref 3–11)
AST: 23 U/L (ref 5–34)
Alkaline Phosphatase: 275 U/L — ABNORMAL HIGH (ref 40–150)
BUN: 17 mg/dL (ref 7–26)
CHLORIDE: 104 mmol/L (ref 98–109)
CO2: 21 mmol/L — ABNORMAL LOW (ref 22–29)
Calcium: 8.6 mg/dL (ref 8.4–10.4)
Creatinine, Ser: 0.64 mg/dL — ABNORMAL LOW (ref 0.70–1.30)
GFR calc Af Amer: >60 mL/min (ref >60)
GFR calc non Af Amer: >60 mL/min (ref >60)
GLUCOSE: 107 mg/dL (ref 70–140)
Potassium: 4.6 mmol/L (ref 3.5–5.1)
SODIUM: 134 mmol/L — AB (ref 136–145)
TOTAL PROTEIN: 6.2 g/dL — AB (ref 6.4–8.3)
Total Bilirubin: 0.5 mg/dL (ref 0.2–1.2)

## 2017-08-21 LAB — SAMPLE TO BLOOD BANK

## 2017-08-21 LAB — CEA (IN HOUSE-CHCC): CEA (CHCC-IN HOUSE): 1480.02 ng/mL — AB (ref 0.00–5.00)

## 2017-08-21 MED ORDER — DIPHENHYDRAMINE HCL 50 MG/ML IJ SOLN
50.0000 mg | Freq: Once | INTRAMUSCULAR | Status: AC
  Administered 2017-08-21: 50 mg via INTRAVENOUS

## 2017-08-21 MED ORDER — SODIUM CHLORIDE 0.9 % IV SOLN
Freq: Once | INTRAVENOUS | Status: AC
  Administered 2017-08-21: 12:00:00 via INTRAVENOUS

## 2017-08-21 MED ORDER — DEXAMETHASONE SODIUM PHOSPHATE 10 MG/ML IJ SOLN
10.0000 mg | Freq: Once | INTRAMUSCULAR | Status: AC
  Administered 2017-08-21: 10 mg via INTRAVENOUS

## 2017-08-21 MED ORDER — FAMOTIDINE IN NACL 20-0.9 MG/50ML-% IV SOLN
20.0000 mg | Freq: Once | INTRAVENOUS | Status: AC
  Administered 2017-08-21: 20 mg via INTRAVENOUS

## 2017-08-21 MED ORDER — DIPHENHYDRAMINE HCL 50 MG/ML IJ SOLN
INTRAMUSCULAR | Status: AC
  Filled 2017-08-21: qty 1

## 2017-08-21 MED ORDER — HEPARIN SOD (PORK) LOCK FLUSH 100 UNIT/ML IV SOLN
500.0000 [IU] | Freq: Once | INTRAVENOUS | Status: AC | PRN
  Administered 2017-08-21: 500 [IU]
  Filled 2017-08-21: qty 5

## 2017-08-21 MED ORDER — PACLITAXEL CHEMO INJECTION 300 MG/50ML
80.0000 mg/m2 | Freq: Once | INTRAVENOUS | Status: AC
  Administered 2017-08-21: 132 mg via INTRAVENOUS
  Filled 2017-08-21: qty 22

## 2017-08-21 MED ORDER — SODIUM CHLORIDE 0.9% FLUSH
10.0000 mL | INTRAVENOUS | Status: DC | PRN
  Administered 2017-08-21: 10 mL
  Filled 2017-08-21: qty 10

## 2017-08-21 MED ORDER — FAMOTIDINE IN NACL 20-0.9 MG/50ML-% IV SOLN
INTRAVENOUS | Status: AC
  Filled 2017-08-21: qty 50

## 2017-08-21 MED ORDER — DEXAMETHASONE SODIUM PHOSPHATE 10 MG/ML IJ SOLN
INTRAMUSCULAR | Status: AC
  Filled 2017-08-21: qty 1

## 2017-08-21 NOTE — Patient Instructions (Signed)
[facility] Discharge Instructions for Patients Receiving Chemotherapy  Today you received the following chemotherapy agents Paclitaxel  To help prevent nausea and vomiting after your treatment, we encourage you to take your nausea medication as directed   If you develop nausea and vomiting that is not controlled by your nausea medication, call the clinic.   BELOW ARE SYMPTOMS THAT SHOULD BE REPORTED IMMEDIATELY:  *FEVER GREATER THAN 100.5 F  *CHILLS WITH OR WITHOUT FEVER  NAUSEA AND VOMITING THAT IS NOT CONTROLLED WITH YOUR NAUSEA MEDICATION  *UNUSUAL SHORTNESS OF BREATH  *UNUSUAL BRUISING OR BLEEDING  TENDERNESS IN MOUTH AND THROAT WITH OR WITHOUT PRESENCE OF ULCERS  *URINARY PROBLEMS  *BOWEL PROBLEMS  UNUSUAL RASH Items with * indicate a potential emergency and should be followed up as soon as possible.  Feel free to call the clinic should you have any questions or concerns. The clinic phone number is [phone].  Please show the CHEMO ALERT CARD at check-in to the Emergency Department and triage nurse.   

## 2017-08-22 ENCOUNTER — Ambulatory Visit (INDEPENDENT_AMBULATORY_CARE_PROVIDER_SITE_OTHER): Payer: Non-veteran care | Admitting: Cardiothoracic Surgery

## 2017-08-22 ENCOUNTER — Encounter: Payer: Self-pay | Admitting: Cardiothoracic Surgery

## 2017-08-22 ENCOUNTER — Other Ambulatory Visit: Payer: Self-pay

## 2017-08-22 VITALS — BP 126/70 | HR 95 | Ht 65.0 in | Wt 129.6 lb

## 2017-08-22 DIAGNOSIS — Z09 Encounter for follow-up examination after completed treatment for conditions other than malignant neoplasm: Secondary | ICD-10-CM

## 2017-08-22 DIAGNOSIS — C155 Malignant neoplasm of lower third of esophagus: Secondary | ICD-10-CM

## 2017-08-22 DIAGNOSIS — C159 Malignant neoplasm of esophagus, unspecified: Secondary | ICD-10-CM | POA: Diagnosis not present

## 2017-08-22 NOTE — Progress Notes (Signed)
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 SURGEON:  Lanelle Bal, M.D.  Cancer Staging Malignant neoplasm of lower third of esophagus [facility]) Staging form: Esophagus - Adenocarcinoma, AJCC 7th Edition - Clinical stage from 12/15/2015: Stage IIIA (T3, N1, M0) - Signed by Truitt Merle, MD on 12/24/2015 - Pathologic stage from 04/26/2016: yT0, N0, cM0, GX - Signed by [name], MD on 04/27/2016    History of Present Illness:     Patient returns to the office today in follow-up after esophageal resection. In February 2018 he had developed significant anemia requiring transfusions upper GI endoscopy and colonoscopy did not reveal any source of bleeding or recurrent tumor, bone marrow biopsy revealed metastatic adenocarcinoma.   Started second line therapy with Taxolon day 1,8 and 15and ramucirumab on day 1 and 15, every 28 days starting 07/19/17  Diagnosis Bone Marrow Biopsy, right iliac - METASTATIC ADENOCARCINOMA. - SEE COMMENT. PERIPHERAL BLOOD: - NORMOCYTIC ANEMIA. - THROMBOCYTOPENIA.  PLEURAL FLUID, LEFT (SPECIMEN 1 OF 1 COLLECTED 07/04/16): REACTIVE APPEARING MESOTHELIAL CELLS. Enid Cutter MD Pathologist, Electronic Signature     Past Medical History:  Diagnosis Date    . Esophageal cancer ([place]) 11/23/15   lower 3rd esohagus   . GERD (gastroesophageal reflux disease)   . Hyperlipidemia   . Hypertension      Social History   Tobacco Use  Smoking Status Former Smoker  . Packs/day: 1.00  . Years: 10.00  . Pack years: 10.00  . Last attempt to quit: 07/24/1967  . Years since quitting: 50.1  Smokeless Tobacco Never Used    Social History   Substance and Sexual Activity  Alcohol Use No     Allergies  Allergen Reactions  . No Known Allergies     Current Outpatient Medications  Medication Sig Dispense Refill  . acetaminophen (TYLENOL) 325 MG tablet Take 650 mg by mouth every 6 (six) hours as needed.    Marland Kitchen atenolol (TENORMIN) 25 MG tablet Take 1 tab in the morning and half tab in the evening (Patient taking differently: Take 12.5-25 mg by mouth 2 (two) times daily. Take 83m in the morning and 12.546min the evening) 60 tablet 1  . folic acid (FOLVITE) 1 MG tablet Take 1 tablet (1 mg total) by mouth daily. 30 tablet 0  . lidocaine-prilocaine (EMLA) cream Apply 1 application topically as needed. Apply 1-2 tsp over port site 1 hour prior to chemotherapy 30 g 1  . mirtazapine (REMERON) 15 MG tablet Take 1 tablet (15 mg total) by mouth at bedtime. 30 tablet 2  . Nutritional Supplements (NUTREN 1.5) LIQD 250 mLs by Jejunal Tube route at bedtime. 3 CANS VIA FEEDING TUBE    . ondansetron (ZOFRAN ODT) 8 MG disintegrating tablet Take 1 tablet (8 mg total) by mouth every 8 (eight) hours as needed for nausea  or vomiting. 30 tablet 2  . oxycodone (OXY-IR) 5 MG capsule Take 5 mg by mouth every 6 (six) hours as needed for pain.     Marland Kitchen prochlorperazine (COMPAZINE) 10 MG tablet Take 1 tablet (10 mg total) by mouth every 6 (six) hours as needed for nausea or vomiting. 30 tablet 3  . vitamin B-12 1000 MCG tablet Take 1 tablet (1,000 mcg total) by mouth daily. 30 tablet 0   No current facility-administered medications for this visit.        Physical Exam: BP 126/70  (BP Location: Left Arm, Patient Position: Sitting, Cuff Size: Normal)   Pulse 95   Ht 5' 5"  (1.651 m)   Wt 129 lb 9.6 oz (58.8 kg)   SpO2 98% Comment: RA  BMI 21.57 kg/m   Wt Readings from Last 3 Encounters:  08/22/17 129 lb 9.6 oz (58.8 kg)  08/14/17 133 lb 6.4 oz (60.5 kg)  08/02/17 131 lb 12.8 oz (59.8 kg)  General appearance: alert and cooperative Head: Normocephalic, without obvious abnormality, atraumatic Neck: no adenopathy, no carotid bruit, no JVD, supple, symmetrical, trachea midline and thyroid not enlarged, symmetric, no tenderness/mass/nodules Lymph nodes: Cervical, supraclavicular, and axillary nodes normal. Resp: diminished breath sounds bibasilar Back: symmetric, no curvature. ROM normal. No CVA tenderness. Cardio: regular rate and rhythm, S1, S2 normal, no murmur, click, rub or gallop GI: soft, non-tender; bowel sounds normal; no masses,  no organomegaly Extremities: extremities normal, atraumatic, no cyanosis or edema and Homans sign is negative, no sign of DVT Neurologic: Grossly normal Patient's feeding tube has some granulation tissue around it and the suture has pulled through the skin, patient is concerned that it may fall out   Diagnostic Studies & Laboratory data:     Recent Radiology Findings:   Dg Chest 2 View  Result Date: 07/24/2017 CLINICAL DATA:  SOB and fatigue for 2 days, chemo treatment on Friday. HTN, Esophageal cancer may 2017. Bone marrow cancer 6 months ago. EXAM: CHEST  2 VIEW COMPARISON:  Chest x-ray dated 03/28/2017. FINDINGS: Stable left pleural effusion, moderate in size. Lungs otherwise clear. Heart size and mediastinal contours are stable. Right chest wall Port-A-Cath remains adequately position with tip at the level of the mid SVC. No acute or suspicious osseous finding. IMPRESSION: 1. Stable left pleural effusion, moderate in size. 2. No acute findings. Electronically Signed   By: Franki Cabot M.D.   On: 07/24/2017 13:56  Study Result    CLINICAL DATA:  Subsequent treatment strategy for esophageal cancer.  EXAM: NUCLEAR MEDICINE PET SKULL BASE TO THIGH  TECHNIQUE: 6.6 mCi F-18 FDG was injected intravenously. Full-ring PET imaging was performed from the skull base to thigh after the radiotracer. CT data was obtained and used for attenuation correction and anatomic localization.  FASTING BLOOD GLUCOSE:  Value: 123 mg/dl  COMPARISON:  PET-CT 11/12/2016 and 08/14/2016.  FINDINGS: NECK  No hypermetabolic cervical lymph nodes are identified.There are no lesions of the pharyngeal mucosal space.  CHEST  There are no hypermetabolic mediastinal, hilar or axillary lymph nodes. Patient is status post esophagectomy and gastric pull-through. There is no abnormal associated metabolic activity. There is no suspicious pleural or pulmonary activity. Moderate size partially loculated left pleural effusion and small dependent right pleural effusion are unchanged. There is stable dependent left lower lobe atelectasis. No suspicious pulmonary nodule.  ABDOMEN/PELVIS  There is no hypermetabolic activity within the liver, right adrenal gland, spleen or pancreas. Mildly hypermetabolic left adrenal nodule is again noted. This has  an SUV max of 4.9 (3.7 previously, and 6.2 on the study before that There is no hypermetabolic nodal activity. Focal hypermetabolic activity is again noted in the left prostate lobe anteriorly. This has an SUV max of 14.4 also similar to prior study. There is stable metabolic activity surrounding the insertion of the gastrojejunostomy. Bilateral renal cysts are noted.  SKELETON  There is no hypermetabolic activity to suggest osseous metastatic disease. Stable mild chronic compression deformity at L1. There are few scattered Schmorl's nodes in the thoracic spine.  IMPRESSION: 1. No activity suspicious for metastatic adenocarcinoma status post esophagectomy and gastric  pull-through. 2. Stable mild prominence of the left adrenal gland and associated hypermetabolic activity from several prior studies. The lack of change implies a benign etiology. No abnormal activity within the right adrenal gland. 3. Persistent focal activity peripherally in the left lobe of the prostate, unchanged from the most recent study. Appearance is nonspecific, and could reflect prostate cancer or a benign nodule. Focal prostatitis less likely given persistence. 4. Stable loculated left pleural effusion and left basilar atelectasis. 5. These results were called by telephone at the time of interpretation on 02/20/2017 at 1:47 pm to Dr. Truitt Merle , who verbally acknowledged these results.   Electronically Signed   By: Richardean Sale M.D.   On: 02/20/2017 13:50       I have independently reviewed the above radiology studies  and reviewed the findings with the patient.   Recent Lab Findings: Lab Results  Component Value Date   WBC 4.8 08/21/2017   HGB 8.7 (L) 07/26/2017   HCT 29.5 (L) 08/21/2017   PLT 189 08/21/2017   GLUCOSE 107 08/21/2017   ALT 13 08/21/2017   AST 23 08/21/2017   NA 134 (L) 08/21/2017   K 4.6 08/21/2017   CL 104 08/21/2017   CREATININE 0.64 (L) 08/21/2017   BUN 17 08/21/2017   CO2 21 (L) 08/21/2017   TSH 2.422 05/03/2016   INR 1.33 07/24/2017   Wt Readings from Last 3 Encounters:  08/22/17 129 lb 9.6 oz (58.8 kg)  08/14/17 133 lb 6.4 oz (60.5 kg)  08/02/17 131 lb 12.8 oz (59.8 kg)      Assessment / Plan:    Patient range clinically stable with known stage IV esophageal cancer currently on second line chemotherapy In office today around his jejunostomy tube was cleaned and after infiltrating with 1% lidocaine a single suture was used to secure his jejunostomy tube in place. Plan to see him back in 3 months or sooner if he has any problems with his jejunostomy tube.   [name] MD      [address] [place] ,[place] [address] Office [phone]   Beeper [phone]  08/22/2017 2:26 PM

## 2017-08-23 ENCOUNTER — Other Ambulatory Visit: Payer: Self-pay | Admitting: Nurse Practitioner

## 2017-08-23 DIAGNOSIS — C155 Malignant neoplasm of lower third of esophagus: Secondary | ICD-10-CM

## 2017-08-27 NOTE — Progress Notes (Signed)
Fred [name]  [phone] Fax:([phone]) [phone]  Clinic Follow up Note   Patient Care Team: [name], MD as PCP - General (Internal Medicine) Truitt Merle, MD as Consulting Physician (Hematology) Kyung Rudd, MD as Consulting Physician (Radiation Oncology) [name], MD as Consulting Physician (Cardiothoracic Surgery) Karie Mainland, RD as Dietitian (Nutrition) Minus Breeding, MD as Consulting Physician (Cardiology) 08/28/2017  CHIEF COMPLAINT: F/u metastatic esophageal cancer  SUMMARY OF ONCOLOGIC HISTORY: Oncology History   Presented with solid dysphagia at least daily with chest pain. Involuntary weight loss of 10-12 lb over past year  Malignant neoplasm of lower third of esophagus (HCC)   Staging form: Esophagus - Adenocarcinoma, AJCC 7th Edition   - Clinical stage from 12/15/2015: Stage IIIA (T3, N1, M0) - Signed by Truitt Merle, MD on 12/24/2015   - Pathologic stage from 04/26/2016: yT0, N0, cM0, GX - Signed by [name], MD on 04/27/2016      Malignant neoplasm of lower third of esophagus ([place])   11/23/2015 Procedure    EGD per [facility](Dr. Toledo):6cm mass in lower third of esophagus      11/24/2015 Initial Diagnosis    Esophageal cancer ([place])      11/24/2015 Pathology Results    Mucinous adenocarcinoma, moderately differentiated-Her2 analysis pending      12/05/2015 Imaging    PET scan showed hypermetabolic a mass in distal esophagus, hypermetabolic activity in the left anterior prostate gland, metabolic density in left adrenal nodule, CT attenuation suggest a benign adrenal adenoma. Left apical 2 mm lung nodule.      12/15/2015 Procedure    EUS showed a uT3N1 lesion from 27cm to 35cm from the incisors       12/26/2015 - 02/02/2016 Chemotherapy    Weekly carboplatin AUC 2 and Taxol 45 mg/m, with concurrent radiation      12/26/2015 - 02/02/2016 Radiation Therapy    Neoadjuvant chemoradiation to the  esophageal cancer      04/23/2016 Surgery    total esophagectomy with cervical esophagogastrostomy, pyloroplasty, feeding jejunostomy       04/23/2016 Pathology Results    Esophagectomy showed no residual tumor, 8 lymph nodes were all negative, incidental leiomyoma 0.2 cm      08/14/2016 PET scan    IMPRESSION: 1. Small hypermetabolic left adrenal mass, this has not changed in size compared to 12/01/15 but has a maximum standard uptake value of 6.2 (previously 4.2). Indeterminate density and MRI characteristics, not specific for adenoma. Early metastatic disease is not readily excluded although the lack of change of size is somewhat reassuring. Surveillance recommended. 2. No other hypermetabolic lesions are identified. 3. Moderate to large left pleural effusion with passive atelectasis. 4. Esophagectomy with gastric pull-through. 5. No hypermetabolic liver lesions are seen. 6. Enlarged prostate gland.      08/16/2016 - 08/23/2016 Hospital Admission    He was diagnosed with DIC, unclear etiology. He was given blood products. He underwent echocardiogram which was negative for endocarditis, CTA chest/abd which was negative for aneurysm or dissection as work up for DIC. He was on prednisone for some time, but stopped as it was not helping much for DIC.       08/28/2016 - 09/01/2016 Hospital Admission    He has had issues with recurrent L sided pleural effusions, DIC and melena. He was last admitted in 1/25, given supportive care with a thoracentesis and blood transfusion. Dr Burr Medico has been following DIC and has requested a direct admit for melena,  anemia and thrombocytopenia. GI called for EGD.        08/31/2016 Pathology Results    Bone Marrow Biopsy, right iliac - METASTATIC ADENOCARCINOMA. - SEE COMMENT. PERIPHERAL BLOOD: - NORMOCYTIC ANEMIA. - THROMBOCYTOPENIA.      08/31/2016 Progression    Bone marrow biopsy showed metastatic adenocarcinoma      09/04/2016 - 06/26/2017 Chemotherapy      FOLFOX every 2 weeks; changed to maintenance 5-FU every 2 weeks starting 02/06/17. Stopped after 06/26/17 due to significant increase in CEA.       09/19/2016 Procedure    Therapeutic thoracentesis of a left pleural effusion performed on 09/19/16 yielded 1.5 liters of blood-tinged fluid. Reactive appearing mesothelial cells were present.      11/12/2016 PET scan     IMPRESSION: 1. Heterogeneous bony uptake with several mildly avid foci primarily in the lower thoracic spine, lumbar spine, and pelvic bones consistent with known bony metastatic disease. 2. There is new uptake in the right adrenal gland not seen previously with no CT correlate. Recommend attention on follow-up. 3. A rounded focus of uptake in the anterior peripheral left prostate is larger in the interval. The findings are nonspecific and could be infectious, inflammatory, or neoplastic. Focal prostatitis or prostate cancer are most likely. 4. The uptake in the left adrenal gland has decreased in the interval. 5. Mild uptake in the bilateral hila may simply represent reactive nodes. Recommend attention on follow-up. 6. The left-sided pleural effusion is a little larger in the interval with loculated components.      02/20/2017 PET scan    PET 02/20/17 IMPRESSION: 1. No activity suspicious for metastatic adenocarcinoma status post esophagectomy and gastric pull-through. 2. Stable mild prominence of the left adrenal gland and associated hypermetabolic activity from several prior studies. The lack of change implies a benign etiology. No abnormal activity within the right adrenal gland. 3. Persistent focal activity peripherally in the left lobe of the prostate, unchanged from the most recent study. Appearance is nonspecific, and could reflect prostate cancer or a benign nodule. Focal prostatitis less likely given persistence. 4. Stable loculated left pleural effusion and left basilar atelectasis. 5. These results were  called by telephone at the time of interpretation on 02/20/2017 at 1:47 pm to Dr. Truitt Merle , who verbally acknowledged these results.      05/23/2017 PET scan    PET  IMPRESSION: 1. No significant change compared to 02/19/2017. 2. No specific findings to suggest metastatic disease in this patient who is status post esophagectomy and gastric pull-through. 3. Left adrenal hypermetabolism and nodularity are unchanged over multiple prior exams, again favoring a benign etiology. 4. Left anterior prostatic hypermetabolism is similar and should be correlated with PSA and possibly prostate MRI, if not performed. 5. Left larger than right pleural effusions, with left-sided mild loculation. 6. Left nephrolithiasis. 7. Coronary artery atherosclerosis. Aortic Atherosclerosis      07/19/2017 -  Chemotherapy    Started second line therapy with Taxol on day 1,8 and 15 and ramucirumab on day 1 and 15, every 28 days starting 07/19/17      CURRENT THERAPY:  second line therapy with Taxolon day 1,8 and 15and ramucirumab on day 1 and 15, every 28 days starting 07/19/17    INTERVAL HISTORY: Fred [name] returns for follow-up as scheduled prior to cycle 2 day 15 taxol/cyramza; he received taxol only on 08/21/17. He reports progressive weakness, fatigue, and increased SOB on standing. He is barely able to walk to bathroom  at home without getting very short of breath and weak, requires assistance to walk most places at home.  He believes the chemo is making him sicker.  Denies cough or chest pain.  Has had low-grade fever at home, 99.8; no chills.  Has occasional pain and oozing at feeding tube site, ongoing since placement.  He saw Dr. Servando Snare last week, examined the area and put in another stitch to secure tube.  Currently he infuses 800 mL free water and 3 cans of Nutren feeding at night over 12 hours.  He eats and drinks little by mouth, denies dysphasia.  Taste is decreased.  No abdominal pain, nausea,  vomiting, constipation, diarrhea.   REVIEW OF SYSTEMS:   Constitutional: Denies chills, weight loss (+) low-grade fever at home, 99.8 (+) progressive fatigue Eyes: Denies blurriness of vision Ears, nose, mouth, throat, and face: Denies mucositis or sore throat Respiratory: Denies cough or wheezes (+) increasing dyspnea on standing Cardiovascular: Denies palpitation, chest discomfort or lower extremity swelling Gastrointestinal:  Denies nausea, vomiting, constipation, diarrhea, dysphagia, heartburn or change in bowel habits (+) feeding tube with occasional pain at site Skin: Denies abnormal skin rashes Lymphatics: Denies new lymphadenopathy or easy bruising Neurological:Denies numbness, tingling (+) progressive weakness, severe Behavioral/Psych: Mood is stable, no new changes  All other systems were reviewed with the patient and are negative.  MEDICAL HISTORY:  Past Medical History:  Diagnosis Date  . Esophageal cancer ([place]) 11/23/15   lower 3rd esohagus   . GERD (gastroesophageal reflux disease)   . Hyperlipidemia   . Hypertension     SURGICAL HISTORY: Past Surgical History:  Procedure Laterality Date  . CHEST TUBE INSERTION Left 04/23/2016   Procedure: CHEST TUBE INSERTION;  Surgeon: [name], MD;  Location: [place];  Service: Thoracic;  Laterality: Left;  . COLONOSCOPY N/A 08/30/2016   Procedure: COLONOSCOPY;  Surgeon: Milus Banister, MD;  Location: [facility];  Service: Endoscopy;  Laterality: N/A;  . COMPLETE ESOPHAGECTOMY N/A 04/23/2016   Procedure: TRANSHIATIAL TOTAL ESOPHAGECTOMY COMPLETE; CERVICAL ESOPHAGOGASTROSTOMY AND PYLOROPLASTY;  Surgeon: [name], MD;  Location: [place];  Service: Thoracic;  Laterality: N/A;  . cystoscope  4/12/1   prostate  . ERCP    . ESOPHAGOGASTRODUODENOSCOPY (EGD) WITH PROPOFOL N/A 08/29/2016   Procedure: ESOPHAGOGASTRODUODENOSCOPY (EGD) WITH PROPOFOL;  Surgeon: Milus Banister, MD;  Location: [facility];  Service: Endoscopy;   Laterality: N/A;  . INGUINAL HERNIA REPAIR    . IR GENERIC HISTORICAL  08/31/2016   IR FLUORO GUIDE PORT INSERTION RIGHT 08/31/2016 Corrie Mckusick, DO [facility]  . IR GENERIC HISTORICAL  08/31/2016   IR US GUIDE VASC ACCESS RIGHT 08/31/2016 Corrie Mckusick, DO [facility]  . JEJUNOSTOMY N/A 04/23/2016   Procedure: FEEDING JEJUNOSTOMY;  Surgeon: [name], MD;  Location: [place];  Service: Thoracic;  Laterality: N/A;  . LEG SURGERY Left    hole  . PROSTATE BIOPSY  10/05/15  . right shoulder surgery    . VIDEO BRONCHOSCOPY N/A 04/23/2016   Procedure: VIDEO BRONCHOSCOPY;  Surgeon: [name], MD;  Location: [facility] OR;  Service: Thoracic;  Laterality: N/A;    I have reviewed the social history and family history with the patient and they are unchanged from previous note.  ALLERGIES:  is allergic to no known allergies.  MEDICATIONS:  Current Outpatient Medications  Medication Sig Dispense Refill  . acetaminophen (TYLENOL) 325 MG tablet Take 650 mg by mouth every 6 (six) hours as needed.    Marland Kitchen  atenolol (TENORMIN) 25 MG tablet Take 1 tab in the morning and half tab in the evening (Patient taking differently: Take 12.5-25 mg by mouth 2 (two) times daily. Take 26m in the morning and 12.57min the evening) 60 tablet 1  . folic acid (FOLVITE) 1 MG tablet Take 1 tablet (1 mg total) by mouth daily. 30 tablet 0  . lidocaine-prilocaine (EMLA) cream Apply 1 application topically as needed. Apply 1-2 tsp over port site 1 hour prior to chemotherapy 30 g 1  . mirtazapine (REMERON) 15 MG tablet Take 1 tablet (15 mg total) by mouth at bedtime. 30 tablet 2  . Nutritional Supplements (NUTREN 1.5) LIQD 250 mLs by Jejunal Tube route at bedtime. 3 CANS VIA FEEDING TUBE    . ondansetron (ZOFRAN ODT) 8 MG disintegrating tablet Take 1 tablet (8 mg total) by mouth every 8 (eight) hours as needed for nausea or vomiting. 30 tablet 2  . prochlorperazine (COMPAZINE) 10 MG tablet Take 1 tablet (10 mg total) by mouth every  6 (six) hours as needed for nausea or vomiting. 30 tablet 3  . vitamin B-12 1000 MCG tablet Take 1 tablet (1,000 mcg total) by mouth daily. 30 tablet 0  . oxycodone (OXY-IR) 5 MG capsule Take 5 mg by mouth every 6 (six) hours as needed for pain.      No current facility-administered medications for this visit.     PHYSICAL EXAMINATION: ECOG PERFORMANCE STATUS: 3-4  Vitals:   08/28/17 0920  BP: (!) 110/53  Pulse: 87  Resp: 20  Temp: 98.2 F (36.8 C)  SpO2: 100%   Filed Weights   08/28/17 0920  Weight: 133 lb (60.3 kg)    GENERAL:alert, calm, no distress SKIN: skin color, texture, turgor are normal, no rashes or significant lesions EYES: normal, Conjunctiva are pink and non-injected, sclera clear OROPHARYNX:no exudate, no erythema and buccal mucosa, and tongue normal (+) pale lips NECK: supple, thyroid normal size, non-tender, without nodularity LYMPH:  no palpable cervical, supraclavicular, axillary lymphadenopathy  LUNGS: clear to auscultation bilaterally with normal breathing effort at rest HEART: regular rate & rhythm and no murmurs and no lower extremity edema ABDOMEN:abdomen soft, non-tender and normal bowel sounds (+) feeding tube, dressing intact with minimal drainage Musculoskeletal:no cyanosis of digits and no clubbing  NEURO: alert & oriented x 3 with fluent speech, no focal motor/sensory deficits PAC without erythema  LABORATORY DATA:  I have reviewed the data as listed CBC Latest Ref Rng & Units 08/28/2017 08/21/2017 08/14/2017  WBC 4.0 - 10.3 K/uL 2.3(L) 4.8 4.8  Hemoglobin 13.0 - 17.1 g/dL - - -  Hematocrit 38.4 - 49.9 % 26.4(L) 29.5(L) 28.6(L)  Platelets 140 - 400 K/uL 182 189 212     CMP Latest Ref Rng & Units 08/28/2017 08/21/2017 08/14/2017  Glucose 70 - 140 mg/dL 128 107 107  BUN 7 - 26 mg/dL 13 17 13   Creatinine 0.70 - 1.30 mg/dL 0.58(L) 0.64(L) 0.61(L)  Sodium 136 - 145 mmol/L 127(L) 134(L) 133(L)  Potassium 3.5 - 5.1 mmol/L 4.8 4.6 4.6  Chloride 98 -  109 mmol/L 98 104 103  CO2 22 - 29 mmol/L 21(L) 21(L) 22  Calcium 8.4 - 10.4 mg/dL 8.4 8.6 8.4  Total Protein 6.4 - 8.3 g/dL 5.8(L) 6.2(L) 5.9(L)  Total Bilirubin 0.2 - 1.2 mg/dL 0.5 0.5 0.4  Alkaline Phos 40 - 150 U/L 194(H) 275(H) 381(H)  AST 5 - 34 U/L 21 23 24   ALT 0 - 55 U/L 13 13 15  CEA 02/20/2017: 2.89 05/15/2017: 57.0 06/12/2017: 255 06/26/2017: 632 07/19/2017: 3138 08/02/2017: 5016 08/21/2017: 1480.02  Pathology report [facility] 09/29/2016   PD-L1 Testing 09/29/2016   Diagnosis 09/27/2016 Consult- Comprehensive, [id number] - 1A Mass, Lower third of Esophagus, Mucosal Biopsy - ADENOCARCINOMA WITH EXTRACELLULAR MUCIN. - SEE COMMENT. Microscopic Comment Provided Her2 IHC with the appropriate controls is negative. Per report Her2 FISH is also negative. There is limited tissue remaining, but [facility] and PDL-1 testing will be attempted as requested.  Diagnosis 09/19/2016 PLEURAL FLUID, LEFT (SPECIMEN 1 OF 1 COLLECTED 09/19/16): REACTIVE APPEARING MESOTHELIAL CELLS PRESENT.  Diagnosis 08/31/2016 Bone Marrow Biopsy, right iliac - METASTATIC ADENOCARCINOMA. - SEE COMMENT. PERIPHERAL BLOOD: - NORMOCYTIC ANEMIA. - THROMBOCYTOPENIA.  Diagnosis 08/02/16 PLEURAL FLUID, LEFT (SPECIMEN 1 OF 1 COLLECTED 08/02/16): REACTIVE MESOTHELIAL CELLS PRESENT. LYMPHOCYTES PRESENT.  Diagnosis 11/23/2015 Mass, lower third of the esophagus, mucosal biopsy Mucinous adenocarcinoma, moderately differentiated.  Diagnosis 04/23/2016 1. Lymph node, biopsy, Cervical - ONE OF ONE LYMPH NODES NEGATIVE FOR CARCINOMA (0/1). 2. Lymph node, biopsy, Periesophageal - BLOOD WITH SCANT BENIGN SOFT TISSUE AND SKELETAL MUSCLE. - NO MALIGNANCY IDENTIFIED. 3. Esophagogastrectomy - ULCERATION WITH INFLAMMATION AND REACTIVE CHANGES. - FOCAL INTESTINAL METAPLASIA. - CHANGES CONSISTENT WITH TREATMENT EFFECT. - SEVEN OF SEVEN LYMPH NODES NEGATIVE FOR CARCINOMA (0/7). - INCIDENTAL  LEIOMYOMA, 0.2 CM. - MARGINS ARE NEGATIVE FOR DYSPLASIA OR MALIGNANCY. - SEE ONCOLOGY TABLE. Microscopic Comment 3. ESOPHAGUS: Specimen: Esophagus and proximal stomach, cervical lymph node. Procedure: Esophagogastrectomy and cervical lymph node biopsy. Tumor Site: Presumed GE junction, see comment. Relationship of Tumor to esophagogastric junction: Can not be assessed. Distance of tumor center from esophagogastric junction: Can not be assessed. Tumor Size Greatest dimension: No residual tumor present. Histologic Type: Per medical record adenocarcinoma (no biopsy for review). Histologic Grade: N/A. Microscopic Tumor Extension: N/A. Margins: Negative. Treatment Effect: Present (TRS 0). Lymph-Vascular Invasion: Not identified. Perineural Invasion: Not identified. Lymph nodes: number examined 8; number positive: 0 TNM: ypT0, ypN0 see comment. 1 of 3 FINAL for IRISH, PIECH ([id number]) Microscopic Comment(continued) Ancillary studies: None. Comments: The entire GE junction is submitted for evaluation. There is ulceration with associated inflammation. There is acellular mucin/myxoid changes which extend through the muscularis propria, but no viable/residual tumor is identified and thus the stage is ypT0. There is focal background intestinal metaplasia, which may have been pre-existing (Barrett's esophagus) or related to therapy.    RADIOGRAPHIC STUDIES: I have personally reviewed the radiological images as listed and agreed with the findings in the report. No results found.   ASSESSMENT & PLAN: 77 y.o. male presented with dysphagia with solid food  1. 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  -S/p cycle 2 day 8 taxol; current regimen includes taxol on days 1, 8, and 15 with cyramza on  days 1 and 15. He has considerable fatigue, weakness, and dyspnea -Labs reviewed, Cmet stable; mild anemia at 9.1, ANC 1.3; given his decreased performance status, will hold treatment today and give 1 L IVF over 2 hours -His CEA has been trending down, but his clinical picture appears changed. Will obtain chest xray, may consider restaging PET pending results.  -will call patient with results, f/u in 2 weeks  2. Recurrent left pleural effusion, dyspnea -moderate pleural effusion on 07/24/17 Chest Xray was stable, lungs otherwise clear -O2 sat on ambulation remained 100% in clinic today -will repeat  chest xray today to evaluate pleural effusion or other acute change  3.  Malnutrition -He continues water and nourishment via feeding tube at night; eats little po during the day -I encouraged him to increase water given his low BP, fatigue, and weakness and supplement po as tolerated -Weight is stable  4.  Progressive fatigue, weakness -likely related to chemo, anemia, malnutrition, and underlying disease -He feels significant change from previous cycles, will hold chemo and give IVF;  -Will likely restage with PET to evaluate disease  PLAN:  -Hold taxol/cyramza today -1 L NS over 2 hours today -Chest Xray today, f/u with results  -Increase nutrition as tolerated  -Return in 2 weeks for f/u and consideration for chemotherapy   Orders Placed This Encounter  Procedures  . DG Chest 2 View    Standing Status:   Future    Number of Occurrences:   1    Standing Expiration Date:   08/28/2018    Order Specific Question:   Reason for Exam (SYMPTOM  OR DIAGNOSIS REQUIRED)    Answer:   worsening SOB, increased fatigue x1 week    Order Specific Question:   Preferred imaging location?    Answer:   [facility]    Order Specific Question:   Radiology Contrast Protocol - do NOT remove file path    Answer:   [email or web address]   All questions were  answered. The patient knows to call the clinic with any problems, questions or concerns. No barriers to learning was detected. I spent 20 minutes counseling the patient face to face. The total time spent in the appointment was 25 minutes and more than 50% was on counseling and review of test results.   Alla Feeling, NP 08/28/17

## 2017-08-28 ENCOUNTER — Inpatient Hospital Stay: Payer: Non-veteran care | Attending: Hematology

## 2017-08-28 ENCOUNTER — Inpatient Hospital Stay: Payer: Non-veteran care

## 2017-08-28 ENCOUNTER — Ambulatory Visit (HOSPITAL_COMMUNITY)
Admission: RE | Admit: 2017-08-28 | Discharge: 2017-08-28 | Disposition: A | Discharge status: Home / Self Care | Payer: Non-veteran care | Source: Ambulatory Visit | Attending: Nurse Practitioner | Admitting: Nurse Practitioner

## 2017-08-28 ENCOUNTER — Inpatient Hospital Stay (HOSPITAL_BASED_OUTPATIENT_CLINIC_OR_DEPARTMENT_OTHER): Payer: Non-veteran care | Admitting: Nurse Practitioner

## 2017-08-28 ENCOUNTER — Encounter: Payer: Self-pay | Admitting: Nurse Practitioner

## 2017-08-28 VITALS — BP 119/61 | HR 96 | Temp 98.5°F | Resp 18

## 2017-08-28 VITALS — BP 110/53 | HR 87 | Temp 98.2°F | Resp 20 | Ht 65.0 in | Wt 133.0 lb

## 2017-08-28 DIAGNOSIS — I10 Essential (primary) hypertension: Secondary | ICD-10-CM | POA: Insufficient documentation

## 2017-08-28 DIAGNOSIS — Z95828 Presence of other vascular implants and grafts: Secondary | ICD-10-CM

## 2017-08-28 DIAGNOSIS — G4700 Insomnia, unspecified: Secondary | ICD-10-CM | POA: Diagnosis not present

## 2017-08-28 DIAGNOSIS — R0609 Other forms of dyspnea: Secondary | ICD-10-CM

## 2017-08-28 DIAGNOSIS — I2510 Atherosclerotic heart disease of native coronary artery without angina pectoris: Secondary | ICD-10-CM | POA: Diagnosis not present

## 2017-08-28 DIAGNOSIS — T451X5S Adverse effect of antineoplastic and immunosuppressive drugs, sequela: Secondary | ICD-10-CM | POA: Diagnosis not present

## 2017-08-28 DIAGNOSIS — E440 Moderate protein-calorie malnutrition: Secondary | ICD-10-CM

## 2017-08-28 DIAGNOSIS — Z79899 Other long term (current) drug therapy: Secondary | ICD-10-CM | POA: Insufficient documentation

## 2017-08-28 DIAGNOSIS — N400 Enlarged prostate without lower urinary tract symptoms: Secondary | ICD-10-CM | POA: Diagnosis not present

## 2017-08-28 DIAGNOSIS — J90 Pleural effusion, not elsewhere classified: Secondary | ICD-10-CM

## 2017-08-28 DIAGNOSIS — D649 Anemia, unspecified: Secondary | ICD-10-CM | POA: Insufficient documentation

## 2017-08-28 DIAGNOSIS — R530 Neoplastic (malignant) related fatigue: Secondary | ICD-10-CM | POA: Diagnosis not present

## 2017-08-28 DIAGNOSIS — C155 Malignant neoplasm of lower third of esophagus: Secondary | ICD-10-CM

## 2017-08-28 DIAGNOSIS — Z923 Personal history of irradiation: Secondary | ICD-10-CM | POA: Insufficient documentation

## 2017-08-28 DIAGNOSIS — R439 Unspecified disturbances of smell and taste: Secondary | ICD-10-CM | POA: Insufficient documentation

## 2017-08-28 DIAGNOSIS — D65 Disseminated intravascular coagulation [defibrination syndrome]: Secondary | ICD-10-CM | POA: Diagnosis not present

## 2017-08-28 DIAGNOSIS — I700 Atherosclerosis of aorta: Secondary | ICD-10-CM | POA: Diagnosis not present

## 2017-08-28 DIAGNOSIS — J9811 Atelectasis: Secondary | ICD-10-CM | POA: Diagnosis not present

## 2017-08-28 DIAGNOSIS — E785 Hyperlipidemia, unspecified: Secondary | ICD-10-CM | POA: Insufficient documentation

## 2017-08-28 DIAGNOSIS — E279 Disorder of adrenal gland, unspecified: Secondary | ICD-10-CM | POA: Diagnosis not present

## 2017-08-28 DIAGNOSIS — D6959 Other secondary thrombocytopenia: Secondary | ICD-10-CM | POA: Insufficient documentation

## 2017-08-28 DIAGNOSIS — K219 Gastro-esophageal reflux disease without esophagitis: Secondary | ICD-10-CM | POA: Diagnosis not present

## 2017-08-28 DIAGNOSIS — Z5112 Encounter for antineoplastic immunotherapy: Secondary | ICD-10-CM | POA: Diagnosis not present

## 2017-08-28 DIAGNOSIS — G620 Drug-induced polyneuropathy: Secondary | ICD-10-CM | POA: Diagnosis not present

## 2017-08-28 DIAGNOSIS — F329 Major depressive disorder, single episode, unspecified: Secondary | ICD-10-CM | POA: Diagnosis not present

## 2017-08-28 DIAGNOSIS — C7951 Secondary malignant neoplasm of bone: Secondary | ICD-10-CM

## 2017-08-28 DIAGNOSIS — R531 Weakness: Secondary | ICD-10-CM | POA: Diagnosis not present

## 2017-08-28 DIAGNOSIS — Z9221 Personal history of antineoplastic chemotherapy: Secondary | ICD-10-CM | POA: Insufficient documentation

## 2017-08-28 DIAGNOSIS — Z5111 Encounter for antineoplastic chemotherapy: Secondary | ICD-10-CM | POA: Diagnosis not present

## 2017-08-28 LAB — COMPREHENSIVE METABOLIC PANEL
ALT: 13 U/L (ref 0–55)
ANION GAP: 8 (ref 3–11)
AST: 21 U/L (ref 5–34)
Albumin: 3 g/dL — ABNORMAL LOW (ref 3.5–5.0)
Alkaline Phosphatase: 194 U/L — ABNORMAL HIGH (ref 40–150)
BUN: 13 mg/dL (ref 7–26)
CHLORIDE: 98 mmol/L (ref 98–109)
CO2: 21 mmol/L — ABNORMAL LOW (ref 22–29)
Calcium: 8.4 mg/dL (ref 8.4–10.4)
Creatinine, Ser: 0.58 mg/dL — ABNORMAL LOW (ref 0.70–1.30)
Glucose, Bld: 128 mg/dL (ref 70–140)
POTASSIUM: 4.8 mmol/L (ref 3.5–5.1)
Sodium: 127 mmol/L — ABNORMAL LOW (ref 136–145)
Total Bilirubin: 0.5 mg/dL (ref 0.2–1.2)
Total Protein: 5.8 g/dL — ABNORMAL LOW (ref 6.4–8.3)

## 2017-08-28 LAB — CBC WITH DIFFERENTIAL (CANCER CENTER ONLY)
Basophils Absolute: 0 10*3/uL (ref 0.0–0.1)
Basophils Relative: 0 %
EOS PCT: 0 %
Eosinophils Absolute: 0 10*3/uL (ref 0.0–0.5)
HCT: 26.4 % — ABNORMAL LOW (ref 38.4–49.9)
Hemoglobin: 9.1 g/dL — ABNORMAL LOW (ref 13.0–17.1)
LYMPHS PCT: 13 %
Lymphs Abs: 0.3 10*3/uL — ABNORMAL LOW (ref 0.9–3.3)
MCH: 31.9 pg (ref 27.2–33.4)
MCHC: 34.5 g/dL (ref 32.0–36.0)
MCV: 92.6 fL (ref 79.3–98.0)
MONO ABS: 0.6 10*3/uL (ref 0.1–0.9)
Monocytes Relative: 27 %
Neutro Abs: 1.3 10*3/uL — ABNORMAL LOW (ref 1.5–6.5)
Neutrophils Relative %: 60 %
PLATELETS: 182 10*3/uL (ref 140–400)
RBC: 2.85 MIL/uL — AB (ref 4.20–5.82)
RDW: 17.7 % — AB (ref 11.0–14.6)
Smear Review: 7
WBC: 2.3 10*3/uL — AB (ref 4.0–10.3)

## 2017-08-28 MED ORDER — HEPARIN SOD (PORK) LOCK FLUSH 100 UNIT/ML IV SOLN
250.0000 [IU] | Freq: Once | INTRAVENOUS | Status: AC
  Administered 2017-08-28: 250 [IU]
  Filled 2017-08-28: qty 5

## 2017-08-28 MED ORDER — SODIUM CHLORIDE 0.9 % IV SOLN
Freq: Once | INTRAVENOUS | Status: AC
  Administered 2017-08-28: 11:00:00 via INTRAVENOUS

## 2017-08-28 MED ORDER — SODIUM CHLORIDE 0.9% FLUSH
10.0000 mL | Freq: Once | INTRAVENOUS | Status: AC
  Administered 2017-08-28: 10 mL
  Filled 2017-08-28: qty 10

## 2017-08-28 NOTE — Patient Instructions (Signed)
Implanted Port Home Guide An implanted port is a type of central line that is placed under the skin. Central lines are used to provide IV access when treatment or nutrition needs to be given through a person's veins. Implanted ports are used for long-term IV access. An implanted port may be placed because:  You need IV medicine that would be irritating to the small veins in your hands or arms.  You need long-term IV medicines, such as antibiotics.  You need IV nutrition for a long period.  You need frequent blood draws for lab tests.  You need dialysis.  Implanted ports are usually placed in the chest area, but they can also be placed in the upper arm, the abdomen, or the leg. An implanted port has two main parts:  Reservoir. The reservoir is round and will appear as a small, raised area under your skin. The reservoir is the part where a needle is inserted to give medicines or draw blood.  Catheter. The catheter is a thin, flexible tube that extends from the reservoir. The catheter is placed into a large vein. Medicine that is inserted into the reservoir goes into the catheter and then into the vein.  How will I care for my incision site? Do not get the incision site wet. Bathe or shower as directed by your health care provider. How is my port accessed? Special steps must be taken to access the port:  Before the port is accessed, a numbing cream can be placed on the skin. This helps numb the skin over the port site.  Your health care provider uses a sterile technique to access the port. ? Your health care provider must put on a mask and sterile gloves. ? The skin over your port is cleaned carefully with an antiseptic and allowed to dry. ? The port is gently pinched between sterile gloves, and a needle is inserted into the port.  Only "non-coring" port needles should be used to access the port. Once the port is accessed, a blood return should be checked. This helps ensure that the port  is in the vein and is not clogged.  If your port needs to remain accessed for a constant infusion, a clear (transparent) bandage will be placed over the needle site. The bandage and needle will need to be changed every week, or as directed by your health care provider.  Keep the bandage covering the needle clean and dry. Do not get it wet. Follow your health care provider's instructions on how to take a shower or bath while the port is accessed.  If your port does not need to stay accessed, no bandage is needed over the port.  What is flushing? Flushing helps keep the port from getting clogged. Follow your health care provider's instructions on how and when to flush the port. Ports are usually flushed with saline solution or a medicine called heparin. The need for flushing will depend on how the port is used.  If the port is used for intermittent medicines or blood draws, the port will need to be flushed: ? After medicines have been given. ? After blood has been drawn. ? As part of routine maintenance.  If a constant infusion is running, the port may not need to be flushed.  How long will my port stay implanted? The port can stay in for as long as your health care provider thinks it is needed. When it is time for the port to come out, surgery will be   done to remove it. The procedure is similar to the one performed when the port was put in. When should I seek immediate medical care? When you have an implanted port, you should seek immediate medical care if:  You notice a bad smell coming from the incision site.  You have swelling, redness, or drainage at the incision site.  You have more swelling or pain at the port site or the surrounding area.  You have a fever that is not controlled with medicine.  This information is not intended to replace advice given to you by your health care provider. Make sure you discuss any questions you have with your health care provider. Document  Released: 07/09/2005 Document Revised: 12/15/2015 Document Reviewed: 03/16/2013 [employer] Interactive Patient Education  2017 [employer]  

## 2017-08-28 NOTE — Progress Notes (Signed)
Walked patient to monitor o2 sats. Pt sats were 100% on RA while walking. Lacie NP made aware.

## 2017-08-28 NOTE — Patient Instructions (Signed)
Dehydration, Adult Dehydration is when there is not enough fluid or water in your body. This happens when you lose more fluids than you take in. Dehydration can range from mild to very bad. It should be treated right away to keep it from getting very bad. Symptoms of mild dehydration may include:  Thirst.  Dry lips.  Slightly dry mouth.  Dry, warm skin.  Dizziness. Symptoms of moderate dehydration may include:  Very dry mouth.  Muscle cramps.  Dark pee (urine). Pee may be the color of tea.  Your body making less pee.  Your eyes making fewer tears.  Heartbeat that is uneven or faster than normal (palpitations).  Headache.  Light-headedness, especially when you stand up from sitting.  Fainting (syncope). Symptoms of very bad dehydration may include:  Changes in skin, such as: ? Cold and clammy skin. ? Blotchy (mottled) or pale skin. ? Skin that does not quickly return to normal after being lightly pinched and let go (poor skin turgor).  Changes in body fluids, such as: ? Feeling very thirsty. ? Your eyes making fewer tears. ? Not sweating when body temperature is high, such as in hot weather. ? Your body making very little pee.  Changes in vital signs, such as: ? Weak pulse. ? Pulse that is more than 100 beats a minute when you are sitting still. ? Fast breathing. ? Low blood pressure.  Other changes, such as: ? Sunken eyes. ? Cold hands and feet. ? Confusion. ? Lack of energy (lethargy). ? Trouble waking up from sleep. ? Short-term weight loss. ? Unconsciousness. Follow these instructions at home:  If told by your doctor, drink an ORS: ? Make an ORS by using instructions on the package. ? Start by drinking small amounts, about  cup (120 mL) every 5-10 minutes. ? Slowly drink more until you have had the amount that your doctor said to have.  Drink enough clear fluid to keep your pee clear or pale yellow. If you were told to drink an ORS, finish the ORS  first, then start slowly drinking clear fluids. Drink fluids such as: ? Water. Do not drink only water by itself. Doing that can make the salt (sodium) level in your body get too low (hyponatremia). ? Ice chips. ? Fruit juice that you have added water to (diluted). ? Low-calorie sports drinks.  Avoid: ? Alcohol. ? Drinks that have a lot of sugar. These include high-calorie sports drinks, fruit juice that does not have water added, and soda. ? Caffeine. ? Foods that are greasy or have a lot of fat or sugar.  Take over-the-counter and prescription medicines only as told by your doctor.  Do not take salt tablets. Doing that can make the salt level in your body get too high (hypernatremia).  Eat foods that have minerals (electrolytes). Examples include bananas, oranges, potatoes, tomatoes, and spinach.  Keep all follow-up visits as told by your doctor. This is important. Contact a doctor if:  You have belly (abdominal) pain that: ? Gets worse. ? Stays in one area (localizes).  You have a rash.  You have a stiff neck.  You get angry or annoyed more easily than normal (irritability).  You are more sleepy than normal.  You have a harder time waking up than normal.  You feel: ? Weak. ? Dizzy. ? Very thirsty.  You have peed (urinated) only a small amount of very dark pee during 6-8 hours. Get help right away if:  You have symptoms of   very bad dehydration.  You cannot drink fluids without throwing up (vomiting).  Your symptoms get worse with treatment.  You have a fever.  You have a very bad headache.  You are throwing up or having watery poop (diarrhea) and it: ? Gets worse. ? Does not go away.  You have blood or something green (bile) in your throw-up.  You have blood in your poop (stool). This may cause poop to look black and tarry.  You have not peed in 6-8 hours.  You pass out (faint).  Your heart rate when you are sitting still is more than 100 beats a  minute.  You have trouble breathing. This information is not intended to replace advice given to you by your health care provider. Make sure you discuss any questions you have with your health care provider. Document Released: 05/05/2009 Document Revised: 01/27/2016 Document Reviewed: 09/02/2015 Elsevier Interactive Patient Education  2018 [employer]  

## 2017-08-29 ENCOUNTER — Telehealth: Payer: Self-pay | Admitting: Emergency Medicine

## 2017-08-29 NOTE — Telephone Encounter (Signed)
Spoke with pts wife Fraser Din.(Per Lacie NP)  Informed her the CXR showed no significant change. PE still remains. Lacie will follow up with Dr.Feng next week regarding pts fatigue/ Sob. Pt and wife verbalized understanding.

## 2017-09-04 ENCOUNTER — Telehealth: Payer: Self-pay | Admitting: Hematology

## 2017-09-04 NOTE — Telephone Encounter (Signed)
I called pt to check how he is doing.  He was very fatigued last week and chemo was held.  He does feel better now, less dyspnea, able to tolerate routine activities.  No other new complaints.  I think his fatigue is probably related to chemo therapy, I will plan to change his chemo to 1 week on, one-week off.   He reports that his feeding tube has been very loose lately, sometime even come out for several inches.  He is able to push it back, the stitches Dr. Servando Snare put on has gone.  He also notices some leakage when he used the tube for feeding, and a mild bloody discharge.  I have sent a message to Dr. Madolyn Frieze to see if he can see patient back to fix the tube, or if he wants IR to exchange the tube.  Fred [name]  09/04/2017

## 2017-09-05 ENCOUNTER — Other Ambulatory Visit: Payer: Self-pay | Admitting: *Deleted

## 2017-09-05 DIAGNOSIS — C159 Malignant neoplasm of esophagus, unspecified: Secondary | ICD-10-CM

## 2017-09-06 ENCOUNTER — Ambulatory Visit (HOSPITAL_COMMUNITY)
Admission: RE | Admit: 2017-09-06 | Discharge: 2017-09-06 | Disposition: A | Discharge status: Home / Self Care | Payer: Non-veteran care | Source: Ambulatory Visit | Attending: Cardiothoracic Surgery | Admitting: Cardiothoracic Surgery

## 2017-09-06 ENCOUNTER — Encounter (HOSPITAL_COMMUNITY): Payer: Self-pay | Admitting: Interventional Radiology

## 2017-09-06 DIAGNOSIS — Y833 Surgical operation with formation of external stoma as the cause of abnormal reaction of the patient, or of later complication, without mention of misadventure at the time of the procedure: Secondary | ICD-10-CM | POA: Insufficient documentation

## 2017-09-06 DIAGNOSIS — K9413 Enterostomy malfunction: Secondary | ICD-10-CM | POA: Insufficient documentation

## 2017-09-06 DIAGNOSIS — C159 Malignant neoplasm of esophagus, unspecified: Secondary | ICD-10-CM

## 2017-09-06 HISTORY — PX: IR REPLC DUODEN/JEJUNO TUBE PERCUT W/FLUORO: IMG2334

## 2017-09-06 MED ORDER — LIDOCAINE VISCOUS 2 % MT SOLN
OROMUCOSAL | Status: AC
  Administered 2017-09-06: 5 mL
  Filled 2017-09-06: qty 15

## 2017-09-06 MED ORDER — IOPAMIDOL (ISOVUE-300) INJECTION 61%
INTRAVENOUS | Status: AC
  Administered 2017-09-06: 15 mL
  Filled 2017-09-06: qty 50

## 2017-09-06 NOTE — Procedures (Signed)
Leaking J tube  S/p fluoro 24 fr J tube replacement  No comp Stable  Ready for use Full report in PACS

## 2017-09-10 NOTE — Progress Notes (Signed)
[place]  [phone] [phone]  Clinic Follow up Note   Patient Care Team: [name], MD as PCP - General (Internal Medicine) Truitt Merle, MD as Consulting Physician (Hematology) Kyung Rudd, MD as Consulting Physician (Radiation Oncology) [name], MD as Consulting Physician (Cardiothoracic Surgery) Karie Mainland, RD as Dietitian (Nutrition) Minus Breeding, MD as Consulting Physician (Cardiology)   Date of Service:  09/12/2017    CHIEF COMPLAINTS:  Follow up metastatic esophageal cancer    Oncology History   Presented with solid dysphagia at least daily with chest pain. Involuntary weight loss of 10-12 lb over past year  Malignant neoplasm of lower third of esophagus (HCC)   Staging form: Esophagus - Adenocarcinoma, AJCC 7th Edition   - Clinical stage from 12/15/2015: Stage IIIA (T3, N1, M0) - Signed by Truitt Merle, MD on 12/24/2015   - Pathologic stage from 04/26/2016: yT0, N0, cM0, GX - Signed by [name], MD on 04/27/2016      Malignant neoplasm of lower third of esophagus ([place])   11/23/2015 Procedure    EGD per [facility](Dr. Toledo):6cm mass in lower third of esophagus      11/24/2015 Initial Diagnosis    Esophageal cancer ([place])      11/24/2015 Pathology Results    Mucinous adenocarcinoma, moderately differentiated-Her2 analysis pending      12/05/2015 Imaging    PET scan showed hypermetabolic a mass in distal esophagus, hypermetabolic activity in the left anterior prostate gland, metabolic density in left adrenal nodule, CT attenuation suggest a benign adrenal adenoma. Left apical 2 mm lung nodule.      12/15/2015 Procedure    EUS showed a uT3N1 lesion from 27cm to 35cm from the incisors       12/26/2015 - 02/02/2016 Chemotherapy    Weekly carboplatin AUC 2 and Taxol 45 mg/m, with concurrent radiation      12/26/2015 - 02/02/2016 Radiation Therapy    Neoadjuvant chemoradiation to the  esophageal cancer      04/23/2016 Surgery    total esophagectomy with cervical esophagogastrostomy, pyloroplasty, feeding jejunostomy       04/23/2016 Pathology Results    Esophagectomy showed no residual tumor, 8 lymph nodes were all negative, incidental leiomyoma 0.2 cm      08/14/2016 PET scan    IMPRESSION: 1. Small hypermetabolic left adrenal mass, this has not changed in size compared to 12/01/15 but has a maximum standard uptake value of 6.2 (previously 4.2). Indeterminate density and MRI characteristics, not specific for adenoma. Early metastatic disease is not readily excluded although the lack of change of size is somewhat reassuring. Surveillance recommended. 2. No other hypermetabolic lesions are identified. 3. Moderate to large left pleural effusion with passive atelectasis. 4. Esophagectomy with gastric pull-through. 5. No hypermetabolic liver lesions are seen. 6. Enlarged prostate gland.      08/16/2016 - 08/23/2016 Hospital Admission    He was diagnosed with DIC, unclear etiology. He was given blood products. He underwent echocardiogram which was negative for endocarditis, CTA chest/abd which was negative for aneurysm or dissection as work up for DIC. He was on prednisone for some time, but stopped as it was not helping much for DIC.       08/28/2016 - 09/01/2016 Hospital Admission    He has had issues with recurrent L sided pleural effusions, DIC and melena. He was last admitted in 1/25, given supportive care with a thoracentesis and blood transfusion. Dr Burr Medico has been following DIC  and has requested a direct admit for melena, anemia and thrombocytopenia. GI called for EGD.        08/31/2016 Pathology Results    Bone Marrow Biopsy, right iliac - METASTATIC ADENOCARCINOMA. - SEE COMMENT. PERIPHERAL BLOOD: - NORMOCYTIC ANEMIA. - THROMBOCYTOPENIA.      08/31/2016 Progression    Bone marrow biopsy showed metastatic adenocarcinoma      09/04/2016 - 06/26/2017 Chemotherapy      FOLFOX every 2 weeks; changed to maintenance 5-FU every 2 weeks starting 02/06/17. Stopped after 06/26/17 due to significant increase in CEA.       09/19/2016 Procedure    Therapeutic thoracentesis of a left pleural effusion performed on 09/19/16 yielded 1.5 liters of blood-tinged fluid. Reactive appearing mesothelial cells were present.      11/12/2016 PET scan     IMPRESSION: 1. Heterogeneous bony uptake with several mildly avid foci primarily in the lower thoracic spine, lumbar spine, and pelvic bones consistent with known bony metastatic disease. 2. There is new uptake in the right adrenal gland not seen previously with no CT correlate. Recommend attention on follow-up. 3. A rounded focus of uptake in the anterior peripheral left prostate is larger in the interval. The findings are nonspecific and could be infectious, inflammatory, or neoplastic. Focal prostatitis or prostate cancer are most likely. 4. The uptake in the left adrenal gland has decreased in the interval. 5. Mild uptake in the bilateral hila may simply represent reactive nodes. Recommend attention on follow-up. 6. The left-sided pleural effusion is a little larger in the interval with loculated components.      02/20/2017 PET scan    PET 02/20/17 IMPRESSION: 1. No activity suspicious for metastatic adenocarcinoma status post esophagectomy and gastric pull-through. 2. Stable mild prominence of the left adrenal gland and associated hypermetabolic activity from several prior studies. The lack of change implies a benign etiology. No abnormal activity within the right adrenal gland. 3. Persistent focal activity peripherally in the left lobe of the prostate, unchanged from the most recent study. Appearance is nonspecific, and could reflect prostate cancer or a benign nodule. Focal prostatitis less likely given persistence. 4. Stable loculated left pleural effusion and left basilar atelectasis. 5. These results were  called by telephone at the time of interpretation on 02/20/2017 at 1:47 pm to Dr. Truitt Merle , who verbally acknowledged these results.      05/23/2017 PET scan    PET  IMPRESSION: 1. No significant change compared to 02/19/2017. 2. No specific findings to suggest metastatic disease in this patient who is status post esophagectomy and gastric pull-through. 3. Left adrenal hypermetabolism and nodularity are unchanged over multiple prior exams, again favoring a benign etiology. 4. Left anterior prostatic hypermetabolism is similar and should be correlated with PSA and possibly prostate MRI, if not performed. 5. Left larger than right pleural effusions, with left-sided mild loculation. 6. Left nephrolithiasis. 7. Coronary artery atherosclerosis. Aortic Atherosclerosis      07/19/2017 -  Chemotherapy    Started second line therapy with Taxol on day 1,8 and 15 and ramucirumab on day 1 and 15, every 28 days starting 07/19/17       HISTORY OF PRESENTING ILLNESS (12/14/2015):  Fred [name] 78 y.o. male is here because of His newly diagnosed esophageal cancer. He is accompanied by his wife and daughter to my clinic today.  He has had dysphagia for 2 months, mainly with solid food, no dysphagia with soft food or liquid. He also has mild pain  in mid chest when he swallows. No nausea, abdominal pain, or other discomfort. He has lost about 15 lbs in the last year, no other symptoms. He was seen by his primary care physician at [facility], and referred to gastroenterologist Dr. Alice Reichert. He underwent EGD on 11/23/2015, which showed a 6 cm mass in the lower third of esophagus. Biopsy showed adenocarcinoma, moderately differentiated. He was referred to [place] to consider neoadjuvant therapy. He is scheduled to see a Psychologist, sport and exercise at [facility] later this week.  He has had some urinary frequency, underwent TURP on 11/02/2015.   CURRENT THERAPY:  second line therapy with Taxol on day 1,8 and 15 and ramucirumab on  day 1 and 15, every 28 days starting 07/19/17, due to significant fatigue I reduced Taxol to every 2 weeks starting 09/12/17.    INTERIM HISTORY:  Fred [name] returns for follow up and treatment for his metastatic esophageal cancer. He presents to the clinic today accompanied by his wife. He has done ambulating oxygen test twice before and his oxygen level had not dropped. His new feeding tube has been working well for him.    On review of symptoms, pt notes he has been SOB when he first stands and starts walking. This is not every time. His wife notes he has been very weak. He will walk back and forth from the bathroom but not much elsewhere. He also has significant fatigue still. He will occasionally stop walking to catch his breath. He denies chest pain. He notes his skin has been dry lately.    MEDICAL HISTORY:  Past Medical History:  Diagnosis Date  . Esophageal cancer ([place]) 11/23/15   lower 3rd esohagus   . GERD (gastroesophageal reflux disease)   . Hyperlipidemia   . Hypertension     SURGICAL HISTORY: Past Surgical History:  Procedure Laterality Date  . CHEST TUBE INSERTION Left 04/23/2016   Procedure: CHEST TUBE INSERTION;  Surgeon: [name], MD;  Location: [place];  Service: Thoracic;  Laterality: Left;  . COLONOSCOPY N/A 08/30/2016   Procedure: COLONOSCOPY;  Surgeon: Milus Banister, MD;  Location: [facility];  Service: Endoscopy;  Laterality: N/A;  . COMPLETE ESOPHAGECTOMY N/A 04/23/2016   Procedure: TRANSHIATIAL TOTAL ESOPHAGECTOMY COMPLETE; CERVICAL ESOPHAGOGASTROSTOMY AND PYLOROPLASTY;  Surgeon: [name], MD;  Location: [place];  Service: Thoracic;  Laterality: N/A;  . cystoscope  4/12/1   prostate  . ERCP    . ESOPHAGOGASTRODUODENOSCOPY (EGD) WITH PROPOFOL N/A 08/29/2016   Procedure: ESOPHAGOGASTRODUODENOSCOPY (EGD) WITH PROPOFOL;  Surgeon: Milus Banister, MD;  Location: [facility];  Service: Endoscopy;  Laterality: N/A;  . INGUINAL HERNIA REPAIR    .  IR GENERIC HISTORICAL  08/31/2016   IR FLUORO GUIDE PORT INSERTION RIGHT 08/31/2016 Corrie Mckusick, DO [facility]  . IR GENERIC HISTORICAL  08/31/2016   IR US GUIDE VASC ACCESS RIGHT 08/31/2016 Corrie Mckusick, DO [facility]  . IR REPLC DUODEN/JEJUNO TUBE PERCUT W/FLUORO  09/06/2017  . JEJUNOSTOMY N/A 04/23/2016   Procedure: FEEDING JEJUNOSTOMY;  Surgeon: [name], MD;  Location: [place];  Service: Thoracic;  Laterality: N/A;  . LEG SURGERY Left    hole  . PROSTATE BIOPSY  10/05/15  . right shoulder surgery    . VIDEO BRONCHOSCOPY N/A 04/23/2016   Procedure: VIDEO BRONCHOSCOPY;  Surgeon: [name], MD;  Location: [facility] OR;  Service: Thoracic;  Laterality: N/A;    SOCIAL HISTORY: Social History   Socioeconomic History  . Marital status: Married  Spouse name: Not on file  . Number of children: 1  . Years of education: Not on file  . Highest education level: Not on file  Social Needs  . Financial resource strain: Not on file  . Food insecurity - worry: Not on file  . Food insecurity - inability: Not on file  . Transportation needs - medical: Not on file  . Transportation needs - non-medical: Not on file  Occupational History  . Occupation: Truck Geophysicist/field seismologist  Tobacco Use  . Smoking status: Former Smoker    Packs/day: 1.00    Years: 10.00    Pack years: 10.00    Last attempt to quit: 07/24/1967    Years since quitting: 50.1  . Smokeless tobacco: Never Used  Substance and Sexual Activity  . Alcohol use: No  . Drug use: No  . Sexual activity: Not Currently  Other Topics Concern  . Not on file  Social History Narrative  . Not on file    FAMILY HISTORY: Family History  Problem Relation Age of Onset  . Cancer Maternal Grandfather 51       gastric cancer   . Alzheimer's disease Mother   . Diabetes Mother   . Stroke Father 21  . Multiple sclerosis Sister     ALLERGIES:  is allergic to no known allergies.  MEDICATIONS:  Current Outpatient Medications  Medication Sig  Dispense Refill  . acetaminophen (TYLENOL) 325 MG tablet Take 650 mg by mouth every 6 (six) hours as needed.    Marland Kitchen atenolol (TENORMIN) 25 MG tablet Take 1 tab in the morning and half tab in the evening (Patient taking differently: Take 12.5-25 mg by mouth 2 (two) times daily. Take 39m in the morning and 12.521min the evening) 60 tablet 1  . folic acid (FOLVITE) 1 MG tablet Take 1 tablet (1 mg total) by mouth daily. 30 tablet 0  . lidocaine-prilocaine (EMLA) cream Apply 1 application topically as needed. Apply 1-2 tsp over port site 1 hour prior to chemotherapy 30 g 1  . mirtazapine (REMERON) 15 MG tablet Take 1 tablet (15 mg total) by mouth at bedtime. 30 tablet 2  . Nutritional Supplements (NUTREN 1.5) LIQD 250 mLs by Jejunal Tube route at bedtime. 3 CANS VIA FEEDING TUBE    . ondansetron (ZOFRAN ODT) 8 MG disintegrating tablet Take 1 tablet (8 mg total) by mouth every 8 (eight) hours as needed for nausea or vomiting. 30 tablet 2  . oxycodone (OXY-IR) 5 MG capsule Take 5 mg by mouth every 6 (six) hours as needed for pain.     . Marland Kitchenrochlorperazine (COMPAZINE) 10 MG tablet Take 1 tablet (10 mg total) by mouth every 6 (six) hours as needed for nausea or vomiting. 30 tablet 3  . vitamin B-12 1000 MCG tablet Take 1 tablet (1,000 mcg total) by mouth daily. 30 tablet 0   No current facility-administered medications for this visit.     REVIEW OF SYSTEMS:   Constitutional: Denies fevers, chills or abnormal night sweats   (+) significant fatigue and low energy  Eyes: Denies blurriness of vision, double vision or watery eyes Ears, nose, mouth, throat, and face: Denies mucositis or sore throat  Respiratory: Denies cough or wheezes (+) SOB upon walking  Cardiovascular: Denies palpitation, chest discomfort or lower extremity swelling (+) new J tube  Gastrointestinal:  Denies heartburn or change in bowel habits Skin: Denies abnormal skin rashes (+) dry skin  Lymphatics: Denies new lymphadenopathy or easy  bruising Neurological:Denies numbness,  tingling or new weaknesses (+) Neuropathy, improved Behavioral/Psych: Mood is stable, no new changes  Musculoskeletal: (+) less ambulating All other systems were reviewed with the patient and are negative.  PHYSICAL EXAMINATION:  ECOG PERFORMANCE STATUS: 2-3 Vitals:   09/12/17 0950  BP: (!) 107/55  Pulse: 94  Resp: 16  Temp: 98.4 F (36.9 C)  TempSrc: Oral  SpO2: 100%  Weight: 130 lb 9.6 oz (59.2 kg)  Height: _0  (1.651 m)    GENERAL:alert, no distress and comfortable, in wheelchair SKIN: Appears to be pale, skin texture, turgor are normal, no rashes or significant lesions EYES: normal, conjunctiva are pink and non-injected, sclera clear OROPHARYNX:no exudate, no erythema and lips, buccal mucosa, and tongue normal  NECK: supple, thyroid normal size, non-tender, without nodularity LYMPH:  no palpable lymphadenopathy in the cervical, axillary or inguinal LUNGS: clear to auscultation and percussion with normal breathing effort HEART: regular rate & rhythm and no murmurs and no lower extremity edema ABDOMEN:abdomen soft, non-tender and normal bowel sounds, J tube in place. No skin erythema or pus. 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  LABORATORY DATA:  I have reviewed the data as listed CBC Latest Ref Rng & Units 09/12/2017 08/28/2017 08/21/2017  WBC 4.0 - 10.3 K/uL 8.3 2.3(L) 4.8  Hemoglobin 13.0 - 17.1 g/dL - - -  Hematocrit 38.4 - 49.9 % 31.5(L) 26.4(L) 29.5(L)  Platelets 140 - 400 K/uL 174 182 189   CMP Latest Ref Rng & Units 09/12/2017 08/28/2017 08/21/2017  Glucose 70 - 140 mg/dL 112 128 107  BUN 7 - 26 mg/dL _1 Creatinine 0.70 - 1.30 mg/dL 0.62(L) 0.58(L) 0.64(L)  Sodium 136 - 145 mmol/L 133(L) 127(L) 134(L)  Potassium 3.5 - 5.1 mmol/L 4.7 4.8 4.6    Chloride 98 - 109 mmol/L 102 98 104  CO2 22 - 29 mmol/L 21(L) 21(L) 21(L)  Calcium 8.4 - 10.4 mg/dL 9.0 8.4 8.6  Total Protein 6.4 - 8.3 g/dL 6.4 5.8(L) 6.2(L)  Total Bilirubin 0.2 - 1.2 mg/dL 0.5 0.5 0.5  Alkaline Phos 40 - 150 U/L 184(H) 194(H) 275(H)  AST 5 - 34 U/L _2 ALT 0 - 55 U/L _3 CEA  02/20/2017: 2.89 05/15/2017: 57.0 06/12/2017: 255 06/26/2017: 632 07/19/2017: 3138 08/02/2017: 5016 08/21/17: 1480.02   Pathology report [facility] TESTING 09/29/2016   PD-L1 Testing 09/29/2016   Diagnosis 09/27/2016 Consult- Comprehensive, [id number] - 1A Mass, Lower third of Esophagus, Mucosal Biopsy - ADENOCARCINOMA WITH EXTRACELLULAR MUCIN. - SEE COMMENT. Microscopic Comment Provided Her2 IHC with the appropriate controls is negative. Per report Her2 FISH is also negative. There is limited tissue remaining, but [facility] and PDL-1 testing will be attempted as requested.  Diagnosis 09/19/2016 PLEURAL FLUID, LEFT (SPECIMEN 1 OF 1 COLLECTED 09/19/16): REACTIVE APPEARING MESOTHELIAL CELLS PRESENT.  Diagnosis 08/31/2016 Bone Marrow Biopsy, right iliac - METASTATIC ADENOCARCINOMA. - SEE COMMENT. PERIPHERAL BLOOD: - NORMOCYTIC ANEMIA. - THROMBOCYTOPENIA.  Diagnosis 08/02/16 PLEURAL FLUID, LEFT (SPECIMEN 1 OF 1 COLLECTED 08/02/16): REACTIVE MESOTHELIAL CELLS PRESENT. LYMPHOCYTES PRESENT.  Diagnosis 11/23/2015 Mass, lower third of the esophagus, mucosal biopsy Mucinous adenocarcinoma, moderately differentiated.  Diagnosis 04/23/2016 1. Lymph node, biopsy, Cervical - ONE OF ONE LYMPH NODES NEGATIVE FOR CARCINOMA (0/1). 2. Lymph node, biopsy, Periesophageal - BLOOD WITH SCANT BENIGN SOFT TISSUE  AND SKELETAL MUSCLE. - NO MALIGNANCY IDENTIFIED. 3. Esophagogastrectomy - ULCERATION WITH INFLAMMATION AND REACTIVE CHANGES. - FOCAL INTESTINAL METAPLASIA. - CHANGES CONSISTENT WITH TREATMENT EFFECT. - SEVEN OF SEVEN LYMPH NODES NEGATIVE FOR CARCINOMA (0/7). -  INCIDENTAL LEIOMYOMA, 0.2 CM. - MARGINS ARE NEGATIVE FOR DYSPLASIA OR MALIGNANCY. - SEE ONCOLOGY TABLE. Microscopic Comment 3. ESOPHAGUS: Specimen: Esophagus and proximal stomach, cervical lymph node. Procedure: Esophagogastrectomy and cervical lymph node biopsy. Tumor Site: Presumed GE junction, see comment. Relationship of Tumor to esophagogastric junction: Can not be assessed. Distance of tumor center from esophagogastric junction: Can not be assessed. Tumor Size Greatest dimension: No residual tumor present. Histologic Type: Per medical record adenocarcinoma (no biopsy for review). Histologic Grade: N/A. Microscopic Tumor Extension: N/A. Margins: Negative. Treatment Effect: Present (TRS 0). Lymph-Vascular Invasion: Not identified. Perineural Invasion: Not identified. Lymph nodes: number examined 8; number positive: 0 TNM: ypT0, ypN0 see comment. 1 of 3 FINAL for SOL, ENGLERT ([id number]) Microscopic Comment(continued) Ancillary studies: None. Comments: The entire GE junction is submitted for evaluation. There is ulceration with associated inflammation. There is acellular mucin/myxoid changes which extend through the muscularis propria, but no viable/residual tumor is identified and thus the stage is ypT0. There is focal background intestinal metaplasia, which may have been pre-existing (Barrett's esophagus) or related to therapy.   RADIOGRAPHIC STUDIES: I have personally reviewed the radiological images as listed and agreed with the findings in the report. Dg Chest 2 View  Result Date: 08/29/2017 CLINICAL DATA:  Cough, shortness of breath, fatigue EXAM: CHEST  2 VIEW COMPARISON:  07/24/2017 FINDINGS: Moderate left pleural effusion and small right pleural effusion, stable since prior study. Left base atelectasis. Heart is normal size. Right Port-A-Cath is unchanged. IMPRESSION: Stable bilateral effusions, left greater than right and left lower lobe atelectasis. Electronically  Signed   By: Rolm Baptise M.D.   On: 08/29/2017 08:07   Ir Replc Duoden/jejuno Tube Percut W/fluoro  Result Date: 09/06/2017 INDICATION: Chronic retracted deteriorated jejunostomy EXAM: FLUOROSCOPIC EXCHANGE OF THE JEJUNOSTOMY (24 FRENCH) MEDICATIONS: NONE. ANESTHESIA/SEDATION: Moderate Sedation Time:  NONE. The patient was continuously monitored during the procedure by the interventional radiology nurse under my direct supervision. CONTRAST:  10 cc-administered into the gastric lumen. FLUOROSCOPY TIME:  Fluoroscopy Time:  36 seconds (2 mGy). COMPLICATIONS: None immediate. PROCEDURE: Informed written consent was obtained from the patient after a thorough discussion of the procedural risks, benefits and alternatives. All questions were addressed. Maximal Sterile Barrier Technique was utilized including caps, mask, sterile gowns, sterile gloves, sterile drape, hand hygiene and skin antiseptic. A timeout was performed prior to the initiation of the procedure. Under fluoroscopy, the existing retracted deteriorated jejunostomy was removed. A new 35 French balloon retention jejunostomy was advanced. Retention balloon inflated with 7 cc saline containing 1 cc contrast and retracted against the abdominal wall. Contrast injection confirms position in the small bowel. No immediate complication. Patient tolerated the procedure well. IMPRESSION: Successful fluoroscopic replacement of the 24 French balloon retention jejunostomy Electronically Signed   By: Jerilynn Mages.  Shick M.D.   On: 09/06/2017 14:15   PET 08/14/2016 IMPRESSION: 1. Small hypermetabolic left adrenal mass, this has not changed in size compared to 12/01/15 but has a maximum standard uptake value of 6.2 (previously 4.2). Indeterminate density and MRI characteristics, not specific for adenoma. Early metastatic disease is not readily excluded although the lack of change of size is somewhat reassuring. Surveillance recommended. 2. No other hypermetabolic lesions are  identified. 3. Moderate to large left pleural effusion with passive atelectasis.  4. Esophagectomy with gastric pull-through. 5. No hypermetabolic liver lesions are seen. 6. Enlarged prostate gland.  CT angio chest/abdomen/pelvis for dissection w/wo contrast 08/22/2016 IMPRESSION: No evidence of aortic aneurysm or dissection. No evidence of pulmonary embolus. Heart is borderline in size. Stable small left adrenal nodule which was hypermetabolic on prior PET CT. Stable large left pleural effusion with compressive atelectasis in the left lower lobe. Stable appearance of the gastric pull-through post esophagectomy.  US Thoracentesis asp pleural space 09/19/2016 IMPRESSION: Successful ultrasound guided diagnostic and therapeutic left thoracentesis yielding 1.5 liters of pleural fluid.  PET 11/12/2016 IMPRESSION: 1. Heterogeneous bony uptake with several mildly avid foci primarily in the lower thoracic spine, lumbar spine, and pelvic bones consistent with known bony metastatic disease. 2. There is new uptake in the right adrenal gland not seen previously with no CT correlate. Recommend attention on follow-up. 3. A rounded focus of uptake in the anterior peripheral left prostate is larger in the interval. The findings are nonspecific and could be infectious, inflammatory, or neoplastic. Focal prostatitis or prostate cancer are most likely. 4. The uptake in the left adrenal gland has decreased in the interval. 5. Mild uptake in the bilateral hila may simply represent reactive nodes. Recommend attention on follow-up. 6. The left-sided pleural effusion is a little larger in the interval with loculated components.    ASSESSMENT & PLAN:  77 y.o. male presented with dysphagia with solid food  1. Distal esophageal adenocarcinoma, uT3N1M0, stage IIIA, ypT0N0M0, diffuse bone metastasis 08/2016, HER2(-), PD-L1 (-), MSI-stable  -I previously reviewed his CT scan, PET scan, EGD, and the biopsy findings  with patient and his family member in details. -The initial PET scan showed no definitive distant metastasis. 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 This will be followed in the future scan. -By EUS, he had T3 N1 stage III disease. -He completed neoadjuvant radiation with weekly Carbo and Taxol. -I previously reviewed his surgical pathology findings, he has had complete pathologic response to neoadjuvant chemotherapy and radiation, which predicts good prognosis -Unfortunately he developed diffuse bone metastasis in January 2018, confirmed by bone marrow biopsy -He has started first-line chemotherapy FOLFOX, tolerating well, and his anemia and thrombocytopenia has previously improved/resolved since chemotherapy -The goal of therapy is palliative -his tumor was negative for HER2 and PD-L1 expression and MSI-stable,  he is not a candidate for immunotherapy or anti-her2 therapy  -I previously reviewed his [facility] genomic testing results, unfortunately there is no good targeted therapy available. 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 I also discussed the option of maintenance Xeloda, he opted 5-fu  -We reviewed his 05/23/17 PET. Unfortunately his bone metastasis not measurable on the PET scan, and scan showed no other definitive evidence of metastatic disease.  -He unfortunately developed disease progression on maintenance therapy. His CEA on 06/26/17 increased to 632. Due to his neuropathy, it would be difficult to restart Oxaliplatin.  -I changed his treatment to second line therapy with Taxol on day 1, 8 and 15 and ramucirumab on day 1 and 15, every 28  days starting 07/19/17 He is tolerating treatment moderately well progressive fatigue and weakness.  -Labs reviewed, Hg at 10.3. Last CEA dropped significantly.  -  He still has persistent significant fatigue. I dicussed the option of reducing his chemo to every other week, changing to irinotecan or the option of stopping chemotherapy altogether if chemo is not tolerable.  -He opted to reduce Taxol to every 2 weeks starting 09/12/17.  -I encouraged him to remain active and walk as much as he can.  -F/u in 2 weeks. If his fatigue does not improve, may change treatment to ramucirumab alone    2. 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 thrombocytopenia has resolved. -He has had 2 blood transfusions on 07/24/17 and 07/30/17 while on chemo. -He has developed a DIC again when he had cancer progression -Since he started chemotherapy, his DIC has improved -His Hgb improved to 10.3 today (09/12/2017), no blood tranfusion today   3. 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  -He has had 3 repeated thoracentesis, all cytology were negative, -Therapeutic thoracentesis performed on 09/19/16 yielded 1.5 liters of blood-tinged fluid. Reactive appearing mesothelial cells were present. -Chest Xray from 08/29/17 showed stable bilateral pleural effusion, L>R,  but not a significant amount to need a thoracentesis.  -Pt's effusion is also contributing to his SOB -Continue to follow-up with Dr. Servando Snare  4. HTN -Continue medication and follow-up of his primary care physician -his PCP at [facility] has changed his BP meds lately  -I encouraged pt to take 0.5-1 tablet of atenolol daily because chemo can raise BP.  His BP lowered to 107/55 today.   5. Malnutrition  -Doing better, he is tolerating J-tube feeding very well, he eats normally. Encouraged the patient to eat small frequent meals. -Feeding  tube site is clean and no bleeding lately  -He takes 3-4 cans a day in addition to eating.  -Continue to follow up with nutritionist at the [facility] -Weight is stable lately with new J tube placement   6. Anorexia and depression  -continue mirtazapine.  -He hasn't talked to the nutritionist in a while, he will follow up    7. Fatigue  -He would like something to help his energy. -We previously discussed steroids to help for this, but I told them that this is not a long term solution -He would not like steroids at this time.  -We previously discussed fatigue being related to chemo or anemia.  -I previously encouraged him to eat well with protein and be more active -Will reduce Taxol and continue to monitor closely.    8. Goal of care discussion  -We previously discussed the incurable nature of his cancer, and the overall poor prognosis, especially if he does not have good response to chemotherapy or progress on chemo -The patient understands the goal of care is palliative. -I recommend DNR/DNI, he will think about it   9. 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  -He has been off Oxaliplatin for over 2 months now -His numbness had increased further up his hands. He denied any tingling or pain. If this progresses further I may stop 5FU -residual neuropathy still present, 5FU stopped as well.  -Will continue to monitor with Taxol treatment  11. Insomnia  -I previously recommend he increase his mirtazapine to 30 mg, he is agreeable.  12. Goal of care discussion  -We again discussed the incurable nature of his cancer, and the overall poor prognosis,  especially if he does not have good response to chemotherapy or progress on chemo -The patient understands the goal of care is palliative. -I recommend DNR/DNI, he will think about it     Plan Lab reviewed, adequate for treatment today  Cancel appointments on 2/27 Lab, port flush, f/u and Taxol and Cyramza on 3/6   All questions were answered. The patient knows to call the clinic with any problems, questions or concerns.  I spent 20 minutes counseling the patient face to face. The total time spent in the appointment was 25 minutes and more than 50% was on counseling.   Truitt Merle, MD 09/12/2017   This document serves as a record of services personally performed by Truitt Merle, MD. It was created on her behalf by Joslyn Devon, a trained medical scribe. The creation of this record is based on the scribe's personal observations and the provider's statements to them.    I have reviewed the above documentation for accuracy and completeness, and I agree with the above.

## 2017-09-12 ENCOUNTER — Inpatient Hospital Stay: Payer: Non-veteran care

## 2017-09-12 ENCOUNTER — Inpatient Hospital Stay (HOSPITAL_BASED_OUTPATIENT_CLINIC_OR_DEPARTMENT_OTHER): Payer: Non-veteran care | Admitting: Hematology

## 2017-09-12 VITALS — BP 117/67 | HR 106 | Temp 97.7°F | Resp 18

## 2017-09-12 VITALS — BP 107/55 | HR 94 | Temp 98.4°F | Resp 16 | Ht 65.0 in | Wt 130.6 lb

## 2017-09-12 DIAGNOSIS — Z95828 Presence of other vascular implants and grafts: Secondary | ICD-10-CM

## 2017-09-12 DIAGNOSIS — D6959 Other secondary thrombocytopenia: Secondary | ICD-10-CM

## 2017-09-12 DIAGNOSIS — R531 Weakness: Secondary | ICD-10-CM

## 2017-09-12 DIAGNOSIS — G4700 Insomnia, unspecified: Secondary | ICD-10-CM

## 2017-09-12 DIAGNOSIS — R530 Neoplastic (malignant) related fatigue: Secondary | ICD-10-CM | POA: Diagnosis not present

## 2017-09-12 DIAGNOSIS — E279 Disorder of adrenal gland, unspecified: Secondary | ICD-10-CM | POA: Diagnosis not present

## 2017-09-12 DIAGNOSIS — I2510 Atherosclerotic heart disease of native coronary artery without angina pectoris: Secondary | ICD-10-CM

## 2017-09-12 DIAGNOSIS — Z79899 Other long term (current) drug therapy: Secondary | ICD-10-CM

## 2017-09-12 DIAGNOSIS — G20 Parkinson's disease: Secondary | ICD-10-CM | POA: Diagnosis not present

## 2017-09-12 DIAGNOSIS — T451X5S Adverse effect of antineoplastic and immunosuppressive drugs, sequela: Secondary | ICD-10-CM | POA: Diagnosis not present

## 2017-09-12 DIAGNOSIS — E785 Hyperlipidemia, unspecified: Secondary | ICD-10-CM

## 2017-09-12 DIAGNOSIS — C155 Malignant neoplasm of lower third of esophagus: Secondary | ICD-10-CM

## 2017-09-12 DIAGNOSIS — D65 Disseminated intravascular coagulation [defibrination syndrome]: Secondary | ICD-10-CM | POA: Diagnosis not present

## 2017-09-12 DIAGNOSIS — D649 Anemia, unspecified: Secondary | ICD-10-CM | POA: Diagnosis not present

## 2017-09-12 DIAGNOSIS — N400 Enlarged prostate without lower urinary tract symptoms: Secondary | ICD-10-CM

## 2017-09-12 DIAGNOSIS — I700 Atherosclerosis of aorta: Secondary | ICD-10-CM

## 2017-09-12 DIAGNOSIS — E440 Moderate protein-calorie malnutrition: Secondary | ICD-10-CM | POA: Diagnosis not present

## 2017-09-12 DIAGNOSIS — Z9221 Personal history of antineoplastic chemotherapy: Secondary | ICD-10-CM

## 2017-09-12 DIAGNOSIS — C7651 Malignant neoplasm of right lower limb: Secondary | ICD-10-CM | POA: Diagnosis not present

## 2017-09-12 DIAGNOSIS — R439 Unspecified disturbances of smell and taste: Secondary | ICD-10-CM

## 2017-09-12 DIAGNOSIS — K219 Gastro-esophageal reflux disease without esophagitis: Secondary | ICD-10-CM

## 2017-09-12 DIAGNOSIS — Z923 Personal history of irradiation: Secondary | ICD-10-CM

## 2017-09-12 DIAGNOSIS — F329 Major depressive disorder, single episode, unspecified: Secondary | ICD-10-CM

## 2017-09-12 DIAGNOSIS — J90 Pleural effusion, not elsewhere classified: Secondary | ICD-10-CM

## 2017-09-12 DIAGNOSIS — I10 Essential (primary) hypertension: Secondary | ICD-10-CM

## 2017-09-12 DIAGNOSIS — Z5111 Encounter for antineoplastic chemotherapy: Secondary | ICD-10-CM | POA: Diagnosis not present

## 2017-09-12 LAB — CBC WITH DIFFERENTIAL (CANCER CENTER ONLY)
BASOS ABS: 0 10*3/uL (ref 0.0–0.1)
Basophils Relative: 0 %
EOS PCT: 0 %
Eosinophils Absolute: 0 10*3/uL (ref 0.0–0.5)
HEMATOCRIT: 31.5 % — AB (ref 38.4–49.9)
Hemoglobin: 10.3 g/dL — ABNORMAL LOW (ref 13.0–17.1)
LYMPHS ABS: 0.8 10*3/uL — AB (ref 0.9–3.3)
LYMPHS PCT: 10 %
MCH: 31.2 pg (ref 27.2–33.4)
MCHC: 32.7 g/dL (ref 32.0–36.0)
MCV: 95.5 fL (ref 79.3–98.0)
Monocytes Absolute: 0.8 10*3/uL (ref 0.1–0.9)
Monocytes Relative: 10 %
NEUTROS ABS: 6.7 10*3/uL — AB (ref 1.5–6.5)
Neutrophils Relative %: 80 %
PLATELETS: 174 10*3/uL (ref 140–400)
RBC: 3.3 MIL/uL — ABNORMAL LOW (ref 4.20–5.82)
RDW: 19.3 % — ABNORMAL HIGH (ref 11.0–14.6)
WBC: 8.3 10*3/uL (ref 4.0–10.3)

## 2017-09-12 LAB — CMP (CANCER CENTER ONLY)
ALBUMIN: 3.2 g/dL — AB (ref 3.5–5.0)
ALT: 18 U/L (ref 0–55)
ANION GAP: 10 (ref 3–11)
AST: 27 U/L (ref 5–34)
Alkaline Phosphatase: 184 U/L — ABNORMAL HIGH (ref 40–150)
BUN: 12 mg/dL (ref 7–26)
CHLORIDE: 102 mmol/L (ref 98–109)
CO2: 21 mmol/L — ABNORMAL LOW (ref 22–29)
Calcium: 9 mg/dL (ref 8.4–10.4)
Creatinine: 0.62 mg/dL — ABNORMAL LOW (ref 0.70–1.30)
GFR, Est AFR Am: >60 mL/min (ref >60)
GFR, Estimated: >60 mL/min (ref >60)
Glucose, Bld: 112 mg/dL (ref 70–140)
POTASSIUM: 4.7 mmol/L (ref 3.5–5.1)
Sodium: 133 mmol/L — ABNORMAL LOW (ref 136–145)
TOTAL PROTEIN: 6.4 g/dL (ref 6.4–8.3)
Total Bilirubin: 0.5 mg/dL (ref 0.2–1.2)

## 2017-09-12 MED ORDER — DIPHENHYDRAMINE HCL 50 MG/ML IJ SOLN
INTRAMUSCULAR | Status: AC
  Filled 2017-09-12: qty 1

## 2017-09-12 MED ORDER — DIPHENHYDRAMINE HCL 50 MG/ML IJ SOLN
50.0000 mg | Freq: Once | INTRAMUSCULAR | Status: AC
  Administered 2017-09-12: 50 mg via INTRAVENOUS

## 2017-09-12 MED ORDER — FAMOTIDINE IN NACL 20-0.9 MG/50ML-% IV SOLN
20.0000 mg | Freq: Once | INTRAVENOUS | Status: DC
  Filled 2017-09-12: qty 50

## 2017-09-12 MED ORDER — HEPARIN SOD (PORK) LOCK FLUSH 100 UNIT/ML IV SOLN
500.0000 [IU] | Freq: Once | INTRAVENOUS | Status: AC | PRN
  Administered 2017-09-12: 500 [IU]
  Filled 2017-09-12: qty 5

## 2017-09-12 MED ORDER — ACETAMINOPHEN 325 MG PO TABS
650.0000 mg | ORAL_TABLET | Freq: Once | ORAL | Status: AC
  Administered 2017-09-12: 650 mg via ORAL

## 2017-09-12 MED ORDER — DEXAMETHASONE SODIUM PHOSPHATE 10 MG/ML IJ SOLN
INTRAMUSCULAR | Status: AC
  Filled 2017-09-12: qty 1

## 2017-09-12 MED ORDER — SODIUM CHLORIDE 0.9 % IV SOLN
20.0000 mg | Freq: Once | INTRAVENOUS | Status: AC
  Administered 2017-09-12: 20 mg via INTRAVENOUS
  Filled 2017-09-12: qty 2

## 2017-09-12 MED ORDER — SODIUM CHLORIDE 0.9 % IV SOLN
Freq: Once | INTRAVENOUS | Status: AC
  Administered 2017-09-12: 12:00:00 via INTRAVENOUS

## 2017-09-12 MED ORDER — ACETAMINOPHEN 325 MG PO TABS
ORAL_TABLET | ORAL | Status: AC
  Filled 2017-09-12: qty 2

## 2017-09-12 MED ORDER — DEXAMETHASONE SODIUM PHOSPHATE 10 MG/ML IJ SOLN
10.0000 mg | Freq: Once | INTRAMUSCULAR | Status: AC
  Administered 2017-09-12: 10 mg via INTRAVENOUS

## 2017-09-12 MED ORDER — SODIUM CHLORIDE 0.9% FLUSH
10.0000 mL | Freq: Once | INTRAVENOUS | Status: AC
  Administered 2017-09-12: 10 mL
  Filled 2017-09-12: qty 10

## 2017-09-12 MED ORDER — SODIUM CHLORIDE 0.9 % IV SOLN
8.0000 mg/kg | Freq: Once | INTRAVENOUS | Status: AC
  Administered 2017-09-12: 500 mg via INTRAVENOUS
  Filled 2017-09-12: qty 50

## 2017-09-12 MED ORDER — SODIUM CHLORIDE 0.9% FLUSH
10.0000 mL | INTRAVENOUS | Status: DC | PRN
  Administered 2017-09-12: 10 mL
  Filled 2017-09-12: qty 10

## 2017-09-12 MED ORDER — SODIUM CHLORIDE 0.9 % IV SOLN
80.0000 mg/m2 | Freq: Once | INTRAVENOUS | Status: AC
  Administered 2017-09-12: 132 mg via INTRAVENOUS
  Filled 2017-09-12: qty 22

## 2017-09-12 NOTE — Patient Instructions (Signed)
[facility] Discharge Instructions for Patients Receiving Chemotherapy  Today you received the following chemotherapy agents: Paclitaxel (Taxol) and Ramucirumab (Cyramza).  To help prevent nausea and vomiting after your treatment, we encourage you to take your nausea medication as prescribed.  If you develop nausea and vomiting that is not controlled by your nausea medication, call the clinic.   BELOW ARE SYMPTOMS THAT SHOULD BE REPORTED IMMEDIATELY:  *FEVER GREATER THAN 100.5 F  *CHILLS WITH OR WITHOUT FEVER  NAUSEA AND VOMITING THAT IS NOT CONTROLLED WITH YOUR NAUSEA MEDICATION  *UNUSUAL SHORTNESS OF BREATH  *UNUSUAL BRUISING OR BLEEDING  TENDERNESS IN MOUTH AND THROAT WITH OR WITHOUT PRESENCE OF ULCERS  *URINARY PROBLEMS  *BOWEL PROBLEMS  UNUSUAL RASH Items with * indicate a potential emergency and should be followed up as soon as possible.  Feel free to call the clinic should you have any questions or concerns. The clinic phone number is [phone].  Please show the [place] at check-in to the Emergency Department and triage nurse.

## 2017-09-14 ENCOUNTER — Encounter: Payer: Self-pay | Admitting: Hematology

## 2017-09-18 ENCOUNTER — Other Ambulatory Visit: Payer: Non-veteran care

## 2017-09-18 ENCOUNTER — Ambulatory Visit: Payer: Non-veteran care

## 2017-09-19 ENCOUNTER — Ambulatory Visit: Payer: Non-veteran care | Admitting: Cardiothoracic Surgery

## 2017-09-19 VITALS — BP 105/64 | HR 90 | Resp 20 | Ht 65.0 in | Wt 131.0 lb

## 2017-09-19 DIAGNOSIS — C155 Malignant neoplasm of lower third of esophagus: Secondary | ICD-10-CM

## 2017-09-19 DIAGNOSIS — K9413 Enterostomy malfunction: Secondary | ICD-10-CM

## 2017-09-19 NOTE — Progress Notes (Signed)
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 SURGEON:  Lanelle Bal, M.D.  Cancer Staging Malignant neoplasm of lower third of esophagus [facility]) Staging form: Esophagus - Adenocarcinoma, AJCC 7th Edition - Clinical stage from 12/15/2015: Stage IIIA (T3, N1, M0) - Signed by Truitt Merle, MD on 12/24/2015 - Pathologic stage from 04/26/2016: yT0, N0, cM0, GX - Signed by [name], MD on 04/27/2016    History of Present Illness:     Patient returns to the office today in follow-up after esophageal resection. In February 2018 he had developed significant anemia requiring transfusions upper GI endoscopy and colonoscopy did not reveal any source of bleeding or recurrent tumor, bone marrow biopsy revealed metastatic adenocarcinoma.   Patient returns today for wound check, and to check his feeding jejunostomy which was replaced by interventional radiology last week, he notes increasing skin excoriation and leaking around the tube  Diagnosis Bone Marrow Biopsy, right iliac - METASTATIC ADENOCARCINOMA. - SEE COMMENT. PERIPHERAL BLOOD: - NORMOCYTIC ANEMIA. - THROMBOCYTOPENIA.  PLEURAL FLUID, LEFT (SPECIMEN 1 OF 1 COLLECTED 07/04/16): REACTIVE APPEARING MESOTHELIAL CELLS. Enid Cutter  MD Pathologist, Electronic Signature     Past Medical History:  Diagnosis Date  . Esophageal cancer ([place]) 11/23/15   lower 3rd esohagus   . GERD (gastroesophageal reflux disease)   . Hyperlipidemia   . Hypertension      Social History   Tobacco Use  Smoking Status Former Smoker  . Packs/day: 1.00  . Years: 10.00  . Pack years: 10.00  . Last attempt to quit: 07/24/1967  . Years since quitting: 50.1  Smokeless Tobacco Never Used    Social History   Substance and Sexual Activity  Alcohol Use No     Allergies  Allergen Reactions  . No Known Allergies     Current Outpatient Medications  Medication Sig Dispense Refill  . acetaminophen (TYLENOL) 325 MG tablet Take 650 mg by mouth every 6 (six) hours as needed.    Marland Kitchen atenolol (TENORMIN) 25 MG tablet Take 1 tab in the morning and half tab in the evening (Patient taking differently: Take 12.5-25 mg by mouth 2 (two) times daily. Take 8m in the morning and 12.560min the evening) 60 tablet 1  . folic acid (FOLVITE) 1 MG tablet Take 1 tablet (1 mg total) by mouth daily. 30 tablet 0  . lidocaine-prilocaine (EMLA) cream Apply 1 application topically as needed. Apply 1-2 tsp over port site 1 hour prior to chemotherapy 30 g 1  . mirtazapine (REMERON) 15 MG tablet Take 1 tablet (15 mg total) by mouth at bedtime. 30 tablet 2  . Nutritional Supplements (NUTREN 1.5) LIQD 250 mLs by Jejunal Tube route at bedtime. 3 CANS VIA FEEDING TUBE    . ondansetron (ZOFRAN ODT) 8 MG disintegrating tablet Take 1 tablet (8 mg total) by mouth  every 8 (eight) hours as needed for nausea or vomiting. 30 tablet 2  . oxycodone (OXY-IR) 5 MG capsule Take 5 mg by mouth every 6 (six) hours as needed for pain.     Marland Kitchen prochlorperazine (COMPAZINE) 10 MG tablet Take 1 tablet (10 mg total) by mouth every 6 (six) hours as needed for nausea or vomiting. 30 tablet 3  . vitamin B-12 1000 MCG tablet Take 1 tablet (1,000 mcg total) by mouth daily. 30 tablet 0   No current  facility-administered medications for this visit.        Physical Exam: BP 105/64   Pulse 90   Resp 20   Ht 5' 5"  (1.651 m)   Wt 131 lb (59.4 kg)   SpO2 98% Comment: RA  BMI 21.80 kg/m   Wt Readings from Last 3 Encounters:  09/19/17 131 lb (59.4 kg)  09/12/17 130 lb 9.6 oz (59.2 kg)  08/28/17 133 lb (60.3 kg)  General appearance: alert, cooperative and appears older than stated age Head: Normocephalic, without obvious abnormality, atraumatic Neck: no adenopathy, no carotid bruit, no JVD, supple, symmetrical, trachea midline and thyroid not enlarged, symmetric, no tenderness/mass/nodules Lymph nodes: Cervical, supraclavicular, and axillary nodes normal. Resp: diminished breath sounds bibasilar Back: symmetric, no curvature. ROM normal. No CVA tenderness. Cardio: regular rate and rhythm, S1, S2 normal, no murmur, click, rub or gallop GI: soft, non-tender; bowel sounds normal; no masses,  no organomegaly and Patient's jejunostomy feeding tube is loose with significant leakage of bile and pancreatic juices onto his skin with excoriation Extremities: extremities normal, atraumatic, no cyanosis or edema and Homans sign is negative, no sign of DVT Neurologic: Grossly normal  Diagnostic Studies & Laboratory data:     Recent Radiology Findings:   Dg Chest 2 View  Result Date: 08/29/2017 CLINICAL DATA:  Cough, shortness of breath, fatigue EXAM: CHEST  2 VIEW COMPARISON:  07/24/2017 FINDINGS: Moderate left pleural effusion and small right pleural effusion, stable since prior study. Left base atelectasis. Heart is normal size. Right Port-A-Cath is unchanged. IMPRESSION: Stable bilateral effusions, left greater than right and left lower lobe atelectasis. Electronically Signed   By: Rolm Baptise M.D.   On: 08/29/2017 08:07   Ir Replc Duoden/jejuno Tube Percut W/fluoro  Result Date: 09/06/2017 INDICATION: Chronic retracted deteriorated jejunostomy EXAM: FLUOROSCOPIC EXCHANGE OF THE  JEJUNOSTOMY (24 FRENCH) MEDICATIONS: NONE. ANESTHESIA/SEDATION: Moderate Sedation Time:  NONE. The patient was continuously monitored during the procedure by the interventional radiology nurse under my direct supervision. CONTRAST:  10 cc-administered into the gastric lumen. FLUOROSCOPY TIME:  Fluoroscopy Time:  36 seconds (2 mGy). COMPLICATIONS: None immediate. PROCEDURE: Informed written consent was obtained from the patient after a thorough discussion of the procedural risks, benefits and alternatives. All questions were addressed. Maximal Sterile Barrier Technique was utilized including caps, mask, sterile gowns, sterile gloves, sterile drape, hand hygiene and skin antiseptic. A timeout was performed prior to the initiation of the procedure. Under fluoroscopy, the existing retracted deteriorated jejunostomy was removed. A new 34 French balloon retention jejunostomy was advanced. Retention balloon inflated with 7 cc saline containing 1 cc contrast and retracted against the abdominal wall. Contrast injection confirms position in the small bowel. No immediate complication. Patient tolerated the procedure well. IMPRESSION: Successful fluoroscopic replacement of the 24 French balloon retention jejunostomy Electronically Signed   By: Jerilynn Mages.  Shick M.D.   On: 09/06/2017 14:15   I have independently reviewed the above radiology studies  and reviewed the findings with the patient.  Study Result   CLINICAL DATA:  Subsequent treatment strategy for esophageal cancer.  EXAM: NUCLEAR MEDICINE PET SKULL BASE TO THIGH  TECHNIQUE: 6.6 mCi F-18 FDG was injected intravenously. Full-ring PET imaging was performed from the skull base to thigh after the radiotracer. CT data was obtained and used for attenuation correction and anatomic localization.  FASTING BLOOD GLUCOSE:  Value: 123 mg/dl  COMPARISON:  PET-CT 11/12/2016 and 08/14/2016.  FINDINGS: NECK  No hypermetabolic cervical lymph nodes are  identified.There are no lesions of the pharyngeal mucosal space.  CHEST  There are no hypermetabolic mediastinal, hilar or axillary lymph nodes. Patient is status post esophagectomy and gastric pull-through. There is no abnormal associated metabolic activity. There is no suspicious pleural or pulmonary activity. Moderate size partially loculated left pleural effusion and small dependent right pleural effusion are unchanged. There is stable dependent left lower lobe atelectasis. No suspicious pulmonary nodule.  ABDOMEN/PELVIS  There is no hypermetabolic activity within the liver, right adrenal gland, spleen or pancreas. Mildly hypermetabolic left adrenal nodule is again noted. This has an SUV max of 4.9 (3.7 previously, and 6.2 on the study before that There is no hypermetabolic nodal activity. Focal hypermetabolic activity is again noted in the left prostate lobe anteriorly. This has an SUV max of 14.4 also similar to prior study. There is stable metabolic activity surrounding the insertion of the gastrojejunostomy. Bilateral renal cysts are noted.  SKELETON  There is no hypermetabolic activity to suggest osseous metastatic disease. Stable mild chronic compression deformity at L1. There are few scattered Schmorl's nodes in the thoracic spine.  IMPRESSION: 1. No activity suspicious for metastatic adenocarcinoma status post esophagectomy and gastric pull-through. 2. Stable mild prominence of the left adrenal gland and associated hypermetabolic activity from several prior studies. The lack of change implies a benign etiology. No abnormal activity within the right adrenal gland. 3. Persistent focal activity peripherally in the left lobe of the prostate, unchanged from the most recent study. Appearance is nonspecific, and could reflect prostate cancer or a benign nodule. Focal prostatitis less likely given persistence. 4. Stable loculated left pleural effusion and left  basilar atelectasis. 5. These results were called by telephone at the time of interpretation on 02/20/2017 at 1:47 pm to Dr. Truitt Merle , who verbally acknowledged these results.   Electronically Signed   By: Richardean Sale M.D.   On: 02/20/2017 13:50        Recent Lab Findings: Lab Results  Component Value Date   WBC 8.3 09/12/2017   HGB 8.7 (L) 07/26/2017   HCT 31.5 (L) 09/12/2017   PLT 174 09/12/2017   GLUCOSE 112 09/12/2017   ALT 18 09/12/2017   AST 27 09/12/2017   NA 133 (L) 09/12/2017   K 4.7 09/12/2017   CL 102 09/12/2017   CREATININE 0.62 (L) 09/12/2017   BUN 12 09/12/2017   CO2 21 (L) 09/12/2017   TSH 2.422 05/03/2016   INR 1.33 07/24/2017   Wt Readings from Last 3 Encounters:  09/19/17 131 lb (59.4 kg)  09/12/17 130 lb 9.6 oz (59.2 kg)  08/28/17 133 lb (60.3 kg)      Assessment / Plan:    Patient range clinically stable with known stage IV esophageal cancer currently on second line chemotherapy In office today around his jejunostomy tube , balloon on the feeding jejunostomy tube was adjusted and the cuff was affixed to cut down on leakage around the tube, patient was instructed in local wound care of the surrounding skin  to cut down on excoriation of the skin.  Plan to see him back in 3 months or sooner if he has any problems with his jejunostomy tube.   [name] MD      [address] [place], [address] Office [phone]   Beeper [phone]  09/19/2017 12:06 PM

## 2017-09-25 ENCOUNTER — Inpatient Hospital Stay (HOSPITAL_BASED_OUTPATIENT_CLINIC_OR_DEPARTMENT_OTHER): Payer: Non-veteran care | Admitting: Nurse Practitioner

## 2017-09-25 ENCOUNTER — Inpatient Hospital Stay: Payer: Non-veteran care

## 2017-09-25 ENCOUNTER — Inpatient Hospital Stay: Payer: Non-veteran care | Admitting: Nutrition

## 2017-09-25 ENCOUNTER — Encounter: Payer: Non-veteran care | Admitting: Nutrition

## 2017-09-25 ENCOUNTER — Encounter: Payer: Self-pay | Admitting: Nurse Practitioner

## 2017-09-25 ENCOUNTER — Inpatient Hospital Stay: Payer: Non-veteran care | Attending: Hematology

## 2017-09-25 VITALS — BP 120/65 | HR 78 | Temp 98.0°F | Resp 16 | Ht 65.0 in | Wt 133.4 lb

## 2017-09-25 DIAGNOSIS — R5383 Other fatigue: Secondary | ICD-10-CM | POA: Diagnosis not present

## 2017-09-25 DIAGNOSIS — J90 Pleural effusion, not elsewhere classified: Secondary | ICD-10-CM

## 2017-09-25 DIAGNOSIS — C7951 Secondary malignant neoplasm of bone: Secondary | ICD-10-CM | POA: Insufficient documentation

## 2017-09-25 DIAGNOSIS — Z5111 Encounter for antineoplastic chemotherapy: Secondary | ICD-10-CM | POA: Insufficient documentation

## 2017-09-25 DIAGNOSIS — R530 Neoplastic (malignant) related fatigue: Secondary | ICD-10-CM

## 2017-09-25 DIAGNOSIS — E46 Unspecified protein-calorie malnutrition: Secondary | ICD-10-CM | POA: Diagnosis not present

## 2017-09-25 DIAGNOSIS — G620 Drug-induced polyneuropathy: Secondary | ICD-10-CM | POA: Insufficient documentation

## 2017-09-25 DIAGNOSIS — Z5112 Encounter for antineoplastic immunotherapy: Secondary | ICD-10-CM | POA: Diagnosis not present

## 2017-09-25 DIAGNOSIS — D599 Acquired hemolytic anemia, unspecified: Secondary | ICD-10-CM | POA: Insufficient documentation

## 2017-09-25 DIAGNOSIS — C155 Malignant neoplasm of lower third of esophagus: Secondary | ICD-10-CM

## 2017-09-25 DIAGNOSIS — Z923 Personal history of irradiation: Secondary | ICD-10-CM | POA: Insufficient documentation

## 2017-09-25 DIAGNOSIS — Z79899 Other long term (current) drug therapy: Secondary | ICD-10-CM | POA: Diagnosis not present

## 2017-09-25 DIAGNOSIS — R630 Anorexia: Secondary | ICD-10-CM | POA: Diagnosis not present

## 2017-09-25 DIAGNOSIS — I10 Essential (primary) hypertension: Secondary | ICD-10-CM | POA: Diagnosis not present

## 2017-09-25 DIAGNOSIS — Z9221 Personal history of antineoplastic chemotherapy: Secondary | ICD-10-CM | POA: Diagnosis not present

## 2017-09-25 DIAGNOSIS — G4700 Insomnia, unspecified: Secondary | ICD-10-CM | POA: Diagnosis not present

## 2017-09-25 DIAGNOSIS — K9429 Other complications of gastrostomy: Secondary | ICD-10-CM | POA: Diagnosis not present

## 2017-09-25 DIAGNOSIS — F329 Major depressive disorder, single episode, unspecified: Secondary | ICD-10-CM | POA: Diagnosis not present

## 2017-09-25 DIAGNOSIS — T451X5S Adverse effect of antineoplastic and immunosuppressive drugs, sequela: Secondary | ICD-10-CM | POA: Diagnosis not present

## 2017-09-25 DIAGNOSIS — N400 Enlarged prostate without lower urinary tract symptoms: Secondary | ICD-10-CM

## 2017-09-25 DIAGNOSIS — Z95828 Presence of other vascular implants and grafts: Secondary | ICD-10-CM

## 2017-09-25 LAB — CMP (CANCER CENTER ONLY)
ALBUMIN: 3.2 g/dL — AB (ref 3.5–5.0)
ALK PHOS: 174 U/L — AB (ref 40–150)
ALT: 18 U/L (ref 0–55)
ANION GAP: 9 (ref 3–11)
AST: 24 U/L (ref 5–34)
BILIRUBIN TOTAL: 0.4 mg/dL (ref 0.2–1.2)
BUN: 11 mg/dL (ref 7–26)
CO2: 21 mmol/L — ABNORMAL LOW (ref 22–29)
Calcium: 9 mg/dL (ref 8.4–10.4)
Chloride: 102 mmol/L (ref 98–109)
Creatinine: 0.63 mg/dL — ABNORMAL LOW (ref 0.70–1.30)
GFR, Est AFR Am: >60 mL/min (ref >60)
GLUCOSE: 144 mg/dL — AB (ref 70–140)
Potassium: 4.7 mmol/L (ref 3.5–5.1)
Sodium: 132 mmol/L — ABNORMAL LOW (ref 136–145)
TOTAL PROTEIN: 6.2 g/dL — AB (ref 6.4–8.3)

## 2017-09-25 LAB — TOTAL PROTEIN, URINE DIPSTICK: Protein, ur: <30 mg/dL

## 2017-09-25 LAB — CBC WITH DIFFERENTIAL (CANCER CENTER ONLY)
BASOS ABS: 0 10*3/uL (ref 0.0–0.1)
BASOS PCT: 1 %
Eosinophils Absolute: 0.1 10*3/uL (ref 0.0–0.5)
Eosinophils Relative: 2 %
HCT: 31.8 % — ABNORMAL LOW (ref 38.4–49.9)
Hemoglobin: 10.5 g/dL — ABNORMAL LOW (ref 13.0–17.1)
Lymphocytes Relative: 18 %
Lymphs Abs: 0.8 10*3/uL — ABNORMAL LOW (ref 0.9–3.3)
MCH: 31.1 pg (ref 27.2–33.4)
MCHC: 33 g/dL (ref 32.0–36.0)
MCV: 94.1 fL (ref 79.3–98.0)
MONOS PCT: 17 %
Monocytes Absolute: 0.8 10*3/uL (ref 0.1–0.9)
NEUTROS PCT: 62 %
Neutro Abs: 2.9 10*3/uL (ref 1.5–6.5)
Platelet Count: 191 10*3/uL (ref 140–400)
RBC: 3.38 MIL/uL — ABNORMAL LOW (ref 4.20–5.82)
RDW: 18.7 % — AB (ref 11.0–14.6)
WBC Count: 4.7 10*3/uL (ref 4.0–10.3)

## 2017-09-25 LAB — CEA (IN HOUSE-CHCC): CEA (CHCC-IN HOUSE): 350.07 ng/mL — AB (ref 0.00–5.00)

## 2017-09-25 MED ORDER — SODIUM CHLORIDE 0.9% FLUSH
10.0000 mL | INTRAVENOUS | Status: DC | PRN
  Administered 2017-09-25: 10 mL
  Filled 2017-09-25: qty 10

## 2017-09-25 MED ORDER — SODIUM CHLORIDE 0.9 % IV SOLN
80.0000 mg/m2 | Freq: Once | INTRAVENOUS | Status: AC
  Administered 2017-09-25: 132 mg via INTRAVENOUS
  Filled 2017-09-25: qty 22

## 2017-09-25 MED ORDER — HEPARIN SOD (PORK) LOCK FLUSH 100 UNIT/ML IV SOLN
500.0000 [IU] | Freq: Once | INTRAVENOUS | Status: AC | PRN
  Administered 2017-09-25: 500 [IU]
  Filled 2017-09-25: qty 5

## 2017-09-25 MED ORDER — FAMOTIDINE IN NACL 20-0.9 MG/50ML-% IV SOLN: 20.0000 mg | Freq: Once | INTRAVENOUS | Status: DC

## 2017-09-25 MED ORDER — DIPHENHYDRAMINE HCL 50 MG/ML IJ SOLN
INTRAMUSCULAR | Status: AC
  Filled 2017-09-25: qty 1

## 2017-09-25 MED ORDER — DEXAMETHASONE SODIUM PHOSPHATE 10 MG/ML IJ SOLN
10.0000 mg | Freq: Once | INTRAMUSCULAR | Status: AC
  Administered 2017-09-25: 10 mg via INTRAVENOUS

## 2017-09-25 MED ORDER — SODIUM CHLORIDE 0.9 % IV SOLN
8.0000 mg/kg | Freq: Once | INTRAVENOUS | Status: AC
  Administered 2017-09-25: 500 mg via INTRAVENOUS
  Filled 2017-09-25: qty 50

## 2017-09-25 MED ORDER — ACETAMINOPHEN 325 MG PO TABS
650.0000 mg | ORAL_TABLET | Freq: Once | ORAL | Status: AC
  Administered 2017-09-25: 650 mg via ORAL

## 2017-09-25 MED ORDER — ACETAMINOPHEN 325 MG PO TABS
ORAL_TABLET | ORAL | Status: AC
  Filled 2017-09-25: qty 2

## 2017-09-25 MED ORDER — SODIUM CHLORIDE 0.9 % IV SOLN
20.0000 mg | Freq: Once | INTRAVENOUS | Status: AC
  Administered 2017-09-25: 20 mg via INTRAVENOUS
  Filled 2017-09-25: qty 2

## 2017-09-25 MED ORDER — DEXAMETHASONE SODIUM PHOSPHATE 10 MG/ML IJ SOLN
INTRAMUSCULAR | Status: AC
  Filled 2017-09-25: qty 1

## 2017-09-25 MED ORDER — SODIUM CHLORIDE 0.9% FLUSH
10.0000 mL | Freq: Once | INTRAVENOUS | Status: AC
  Administered 2017-09-25: 10 mL
  Filled 2017-09-25: qty 10

## 2017-09-25 MED ORDER — SODIUM CHLORIDE 0.9 % IV SOLN
Freq: Once | INTRAVENOUS | Status: AC
  Administered 2017-09-25: 12:00:00 via INTRAVENOUS

## 2017-09-25 MED ORDER — DIPHENHYDRAMINE HCL 50 MG/ML IJ SOLN
50.0000 mg | Freq: Once | INTRAMUSCULAR | Status: AC
  Administered 2017-09-25: 50 mg via INTRAVENOUS

## 2017-09-25 NOTE — Progress Notes (Addendum)
[facility]  [phone] [phone]  Clinic Follow up Note   Patient Care Team: [name], MD as PCP - General (Internal Medicine) Truitt Merle, MD as Consulting Physician (Hematology) Kyung Rudd, MD as Consulting Physician (Radiation Oncology) [name], MD as Consulting Physician (Cardiothoracic Surgery) Karie Mainland, RD as Dietitian (Nutrition) Minus Breeding, MD as Consulting Physician (Cardiology) 09/25/2017  SUMMARY OF ONCOLOGIC HISTORY: Oncology History   Presented with solid dysphagia at least daily with chest pain. Involuntary weight loss of 10-12 lb over past year  Malignant neoplasm of lower third of esophagus (HCC)   Staging form: Esophagus - Adenocarcinoma, AJCC 7th Edition   - Clinical stage from 12/15/2015: Stage IIIA (T3, N1, M0) - Signed by Truitt Merle, MD on 12/24/2015   - Pathologic stage from 04/26/2016: yT0, N0, cM0, GX - Signed by [name], MD on 04/27/2016      Malignant neoplasm of lower third of esophagus ([place])   11/23/2015 Procedure    EGD per [facility](Dr. Toledo):6cm mass in lower third of esophagus      11/24/2015 Initial Diagnosis    Esophageal cancer (Normal)      11/24/2015 Pathology Results    Mucinous adenocarcinoma, moderately differentiated-Her2 analysis pending      12/05/2015 Imaging    PET scan showed hypermetabolic a mass in distal esophagus, hypermetabolic activity in the left anterior prostate gland, metabolic density in left adrenal nodule, CT attenuation suggest a benign adrenal adenoma. Left apical 2 mm lung nodule.      12/15/2015 Procedure    EUS showed a uT3N1 lesion from 27cm to 35cm from the incisors       12/26/2015 - 02/02/2016 Chemotherapy    Weekly carboplatin AUC 2 and Taxol 45 mg/m, with concurrent radiation      12/26/2015 - 02/02/2016 Radiation Therapy    Neoadjuvant chemoradiation to the esophageal cancer      04/23/2016 Surgery    total  esophagectomy with cervical esophagogastrostomy, pyloroplasty, feeding jejunostomy       04/23/2016 Pathology Results    Esophagectomy showed no residual tumor, 8 lymph nodes were all negative, incidental leiomyoma 0.2 cm      08/14/2016 PET scan    IMPRESSION: 1. Small hypermetabolic left adrenal mass, this has not changed in size compared to 12/01/15 but has a maximum standard uptake value of 6.2 (previously 4.2). Indeterminate density and MRI characteristics, not specific for adenoma. Early metastatic disease is not readily excluded although the lack of change of size is somewhat reassuring. Surveillance recommended. 2. No other hypermetabolic lesions are identified. 3. Moderate to large left pleural effusion with passive atelectasis. 4. Esophagectomy with gastric pull-through. 5. No hypermetabolic liver lesions are seen. 6. Enlarged prostate gland.      08/16/2016 - 08/23/2016 Hospital Admission    He was diagnosed with DIC, unclear etiology. He was given blood products. He underwent echocardiogram which was negative for endocarditis, CTA chest/abd which was negative for aneurysm or dissection as work up for DIC. He was on prednisone for some time, but stopped as it was not helping much for DIC.       08/28/2016 - 09/01/2016 Hospital Admission    He has had issues with recurrent L sided pleural effusions, DIC and melena. He was last admitted in 1/25, given supportive care with a thoracentesis and blood transfusion. Dr Burr Medico has been following DIC and has requested a direct admit for melena, anemia and thrombocytopenia. GI called for EGD.  08/31/2016 Pathology Results    Bone Marrow Biopsy, right iliac - METASTATIC ADENOCARCINOMA. - SEE COMMENT. PERIPHERAL BLOOD: - NORMOCYTIC ANEMIA. - THROMBOCYTOPENIA.      08/31/2016 Progression    Bone marrow biopsy showed metastatic adenocarcinoma      09/04/2016 - 06/26/2017 Chemotherapy    FOLFOX every 2 weeks; changed to maintenance 5-FU  every 2 weeks starting 02/06/17. Stopped after 06/26/17 due to significant increase in CEA.       09/19/2016 Procedure    Therapeutic thoracentesis of a left pleural effusion performed on 09/19/16 yielded 1.5 liters of blood-tinged fluid. Reactive appearing mesothelial cells were present.      11/12/2016 PET scan     IMPRESSION: 1. Heterogeneous bony uptake with several mildly avid foci primarily in the lower thoracic spine, lumbar spine, and pelvic bones consistent with known bony metastatic disease. 2. There is new uptake in the right adrenal gland not seen previously with no CT correlate. Recommend attention on follow-up. 3. A rounded focus of uptake in the anterior peripheral left prostate is larger in the interval. The findings are nonspecific and could be infectious, inflammatory, or neoplastic. Focal prostatitis or prostate cancer are most likely. 4. The uptake in the left adrenal gland has decreased in the interval. 5. Mild uptake in the bilateral hila may simply represent reactive nodes. Recommend attention on follow-up. 6. The left-sided pleural effusion is a little larger in the interval with loculated components.      02/20/2017 PET scan    PET 02/20/17 IMPRESSION: 1. No activity suspicious for metastatic adenocarcinoma status post esophagectomy and gastric pull-through. 2. Stable mild prominence of the left adrenal gland and associated hypermetabolic activity from several prior studies. The lack of change implies a benign etiology. No abnormal activity within the right adrenal gland. 3. Persistent focal activity peripherally in the left lobe of the prostate, unchanged from the most recent study. Appearance is nonspecific, and could reflect prostate cancer or a benign nodule. Focal prostatitis less likely given persistence. 4. Stable loculated left pleural effusion and left basilar atelectasis. 5. These results were called by telephone at the time of interpretation on  02/20/2017 at 1:47 pm to Dr. Truitt Merle , who verbally acknowledged these results.      05/23/2017 PET scan    PET  IMPRESSION: 1. No significant change compared to 02/19/2017. 2. No specific findings to suggest metastatic disease in this patient who is status post esophagectomy and gastric pull-through. 3. Left adrenal hypermetabolism and nodularity are unchanged over multiple prior exams, again favoring a benign etiology. 4. Left anterior prostatic hypermetabolism is similar and should be correlated with PSA and possibly prostate MRI, if not performed. 5. Left larger than right pleural effusions, with left-sided mild loculation. 6. Left nephrolithiasis. 7. Coronary artery atherosclerosis. Aortic Atherosclerosis      07/19/2017 -  Chemotherapy    Started second line therapy with Taxol on day 1,8 and 15 and ramucirumab on day 1 and 15, every 28 days starting 07/19/17     CURRENT THERAPY:  second line therapy with Taxolon day 1,8 and 15and ramucirumab on day 1 and 15, every 28 days starting 07/19/17, due to significant fatigue I reduced Taxol to every 2 weeks starting 09/12/17.   INTERVAL HISTORY: Mr. Mannis returns for follow-up as scheduled prior to cycle 3-day 1 Taxol and Cyramza.  Was last treated on 09/12/2017.  He continues to report stable but severe fatigue and low activity level, mostly walking from the bed or couch to  the bathroom with only a few extra trips down the hall.  He was able to walk outside once this week. Remains short of breath on standing. Productive cough with clear sputum is unchanged. Denies fever, chills, chest pain or palpitations. Lacks appetite, eats small amounts by mouth. Remeron has helped sleep but not improved appetite. Cycles J tube feeding for 12 hours overnight with approx 750 cc water. No dysphagia, nausea, or vomiting. Had 2-3 day episode of mild diarrhea end of last week, 1-2 episodes per day and did not require medication. No blood in stool. J tube site  continues to leak bright yellow secretions; covered with dressing today. J tube was replaced by IR 09/06/17 and saw Dr. Servando Snare in f/u 2/28. He instructed him to continue wound care and use barrier cream.   REVIEW OF SYSTEMS:  Constitutional: Denies fevers, chills or abnormal weight loss (+) significant fatigue, low energy; stable (+) lacks appetite  Eyes: Denies blurriness of vision Ears, nose, mouth, throat, and face: Denies mucositis or sore throat Respiratory: Denies  Wheezes (+) ongoing productive cough, clear sputum (+) DOE, increases on standing  Cardiovascular: Denies palpitation, chest discomfort or lower extremity swelling Gastrointestinal:  Denies nausea, vomiting, constipation, hematochezia, heartburn (+) 2-3 day episode mild diarrhea end of last week, 1-2 episodes per day Skin: Denies abnormal skin rashes (+) skin redness and pain surrounding J tube with leaking  Lymphatics: Denies new lymphadenopathy or easy bruising Neurological: (+) generalized weaknesses (+) stable neuropathy to hands, able to function without difficulty  Behavioral/Psych: Mood is stable, no new changes  All other systems were reviewed with the patient and are negative.  MEDICAL HISTORY:  Past Medical History:  Diagnosis Date  . Esophageal cancer ([place]) 11/23/15   lower 3rd esohagus   . GERD (gastroesophageal reflux disease)   . Hyperlipidemia   . Hypertension     SURGICAL HISTORY: Past Surgical History:  Procedure Laterality Date  . CHEST TUBE INSERTION Left 04/23/2016   Procedure: CHEST TUBE INSERTION;  Surgeon: [name], MD;  Location: [facility];  Service: Thoracic;  Laterality: Left;  . COLONOSCOPY N/A 08/30/2016   Procedure: COLONOSCOPY;  Surgeon: Milus Banister, MD;  Location: [facility];  Service: Endoscopy;  Laterality: N/A;  . COMPLETE ESOPHAGECTOMY N/A 04/23/2016   Procedure: TRANSHIATIAL TOTAL ESOPHAGECTOMY COMPLETE; CERVICAL ESOPHAGOGASTROSTOMY AND PYLOROPLASTY;  Surgeon: [name], MD;  Location: [facility];  Service: Thoracic;  Laterality: N/A;  . cystoscope  4/12/1   prostate  . ERCP    . ESOPHAGOGASTRODUODENOSCOPY (EGD) WITH PROPOFOL N/A 08/29/2016   Procedure: ESOPHAGOGASTRODUODENOSCOPY (EGD) WITH PROPOFOL;  Surgeon: Milus Banister, MD;  Location: [facility];  Service: Endoscopy;  Laterality: N/A;  . INGUINAL HERNIA REPAIR    . IR GENERIC HISTORICAL  08/31/2016   IR FLUORO GUIDE PORT INSERTION RIGHT 08/31/2016 Corrie Mckusick, DO [facility]-INTER[facility] RAD  . IR GENERIC HISTORICAL  08/31/2016   IR US GUIDE VASC ACCESS RIGHT 08/31/2016 Corrie Mckusick, DO [facility]  . IR REPLC DUODEN/JEJUNO TUBE PERCUT W/FLUORO  09/06/2017  . JEJUNOSTOMY N/A 04/23/2016   Procedure: FEEDING JEJUNOSTOMY;  Surgeon: [name], MD;  Location: [facility];  Service: Thoracic;  Laterality: N/A;  . LEG SURGERY Left    hole  . PROSTATE BIOPSY  10/05/15  . right shoulder surgery    . VIDEO BRONCHOSCOPY N/A 04/23/2016   Procedure: VIDEO BRONCHOSCOPY;  Surgeon: [name], MD;  Location: [facility];  Service: Thoracic;  Laterality: N/A;    I  have reviewed the social history and family history with the patient and they are unchanged from previous note.  ALLERGIES:  is allergic to no known allergies.  MEDICATIONS:  Current Outpatient Medications  Medication Sig Dispense Refill  . acetaminophen (TYLENOL) 325 MG tablet Take 650 mg by mouth every 6 (six) hours as needed.    Marland Kitchen atenolol (TENORMIN) 25 MG tablet Take 1 tab in the morning and half tab in the evening (Patient taking differently: Take 12.5-25 mg by mouth 2 (two) times daily. Take 44m in the morning and 12.599min the evening) 60 tablet 1  . folic acid (FOLVITE) 1 MG tablet Take 1 tablet (1 mg total) by mouth daily. 30 tablet 0  . lidocaine-prilocaine (EMLA) cream Apply 1 application topically as needed. Apply 1-2 tsp over port site 1 hour prior to chemotherapy 30 g 1  . mirtazapine (REMERON) 15 MG tablet Take 1 tablet (15 mg total) by mouth at  bedtime. 30 tablet 2  . Nutritional Supplements (NUTREN 1.5) LIQD 250 mLs by Jejunal Tube route at bedtime. 3 CANS VIA FEEDING TUBE    . ondansetron (ZOFRAN ODT) 8 MG disintegrating tablet Take 1 tablet (8 mg total) by mouth every 8 (eight) hours as needed for nausea or vomiting. 30 tablet 2  . oxycodone (OXY-IR) 5 MG capsule Take 5 mg by mouth every 6 (six) hours as needed for pain.     . Marland Kitchenrochlorperazine (COMPAZINE) 10 MG tablet Take 1 tablet (10 mg total) by mouth every 6 (six) hours as needed for nausea or vomiting. 30 tablet 3  . vitamin B-12 1000 MCG tablet Take 1 tablet (1,000 mcg total) by mouth daily. 30 tablet 0   No current facility-administered medications for this visit.    Facility-Administered Medications Ordered in Other Visits  Medication Dose Route Frequency Provider Last Rate Last Dose  . heparin lock flush 100 unit/mL  500 Units Intracatheter Once PRN FeTruitt MerleMD      . PACLitaxel (TAXOL) 132 mg in sodium chloride 0.9 % 250 mL chemo infusion (</= 8033m2)  80 mg/m2 (Treatment Plan Recorded) Intravenous Once FenTruitt MerleD      . ramucirumab ([facility] mg in sodium chloride 0.9 % 200 mL chemo infusion  8 mg/kg (Treatment Plan Recorded) Intravenous Once FenTruitt MerleD 250 mL/hr at 09/25/17 1348 500 mg at 09/25/17 1348  . sodium chloride flush (NS) 0.9 % injection 10 mL  10 mL Intracatheter PRN FenTruitt MerleD        PHYSICAL EXAMINATION: ECOG PERFORMANCE STATUS: 3 - Symptomatic, >50% confined to bed  Vitals:   09/25/17 1118  BP: 120/65  Pulse: 78  Resp: 16  Temp: 98 F (36.7 C)  SpO2: 100%   Filed Weights   09/25/17 1118  Weight: 133 lb 6.4 oz (60.5 kg)    GENERAL:alert, no distress and comfortable. (+) Presents in wheelchair  SKIN: skin color, texture, turgor are normal, no rashes or significant lesions EYES: normal, Conjunctiva are pink and non-injected, sclera clear OROPHARYNX:no exudate, no erythema and lips, buccal mucosa, and tongue normal LYMPH:  no  palpable cervical, supraclavicular, or axillary lymphadenopathy  LUNGS: clear to auscultation bilaterally with normal breathing effort HEART: regular rate & rhythm and no murmurs and no lower extremity edema ABDOMEN:abdomen soft, non-tender and normal bowel sounds (+) J tube site covered, yellow drainage to dressing Musculoskeletal:no cyanosis of digits and no clubbing  NEURO: alert & oriented x 3 with fluent speech, no focal motor deficits (+)  mildly decreased vibratory sense to hands bilaterally PAC without erythema   LABORATORY DATA:  I have reviewed the data as listed CBC Latest Ref Rng & Units 09/25/2017 09/12/2017 08/28/2017  WBC 4.0 - 10.3 K/uL 4.7 8.3 2.3(L)  Hemoglobin 13.0 - 17.1 g/dL - - -  Hematocrit 38.4 - 49.9 % 31.8(L) 31.5(L) 26.4(L)  Platelets 140 - 400 K/uL 191 174 182     CMP Latest Ref Rng & Units 09/25/2017 09/12/2017 08/28/2017  Glucose 70 - 140 mg/dL 144(H) 112 128  BUN 7 - 26 mg/dL 11 12 13   Creatinine 0.70 - 1.30 mg/dL 0.63(L) 0.62(L) 0.58(L)  Sodium 136 - 145 mmol/L 132(L) 133(L) 127(L)  Potassium 3.5 - 5.1 mmol/L 4.7 4.7 4.8  Chloride 98 - 109 mmol/L 102 102 98  CO2 22 - 29 mmol/L 21(L) 21(L) 21(L)  Calcium 8.4 - 10.4 mg/dL 9.0 9.0 8.4  Total Protein 6.4 - 8.3 g/dL 6.2(L) 6.4 5.8(L)  Total Bilirubin 0.2 - 1.2 mg/dL 0.4 0.5 0.5  Alkaline Phos 40 - 150 U/L 174(H) 184(H) 194(H)  AST 5 - 34 U/L 24 27 21   ALT 0 - 55 U/L 18 18 13    CEA  02/20/2017: 2.89 05/15/2017: 57.0 06/12/2017: 255 06/26/2017: 632 07/19/2017: 3138 08/02/2017: 5016 08/21/17: 1480.02 09/25/17: 350.07   Pathology report [facility] 09/29/2016   PD-L1 Testing 09/29/2016   Diagnosis 09/27/2016 Consult- Comprehensive, [id number] - 1A Mass, Lower third of Esophagus, Mucosal Biopsy - ADENOCARCINOMA WITH EXTRACELLULAR MUCIN. - SEE COMMENT. Microscopic Comment Provided Her2 IHC with the appropriate controls is negative. Per report Her2 FISH is also negative. There is limited tissue  remaining, but [facility] and PDL-1 testing will be attempted as requested.  Diagnosis 09/19/2016 PLEURAL FLUID, LEFT (SPECIMEN 1 OF 1 COLLECTED 09/19/16): REACTIVE APPEARING MESOTHELIAL CELLS PRESENT.  Diagnosis 08/31/2016 Bone Marrow Biopsy, right iliac - METASTATIC ADENOCARCINOMA. - SEE COMMENT. PERIPHERAL BLOOD: - NORMOCYTIC ANEMIA. - THROMBOCYTOPENIA.  Diagnosis 08/02/16 PLEURAL FLUID, LEFT (SPECIMEN 1 OF 1 COLLECTED 08/02/16): REACTIVE MESOTHELIAL CELLS PRESENT. LYMPHOCYTES PRESENT.  Diagnosis 11/23/2015 Mass, lower third of the esophagus, mucosal biopsy Mucinous adenocarcinoma, moderately differentiated.  Diagnosis 04/23/2016 1. Lymph node, biopsy, Cervical - ONE OF ONE LYMPH NODES NEGATIVE FOR CARCINOMA (0/1). 2. Lymph node, biopsy, Periesophageal - BLOOD WITH SCANT BENIGN SOFT TISSUE AND SKELETAL MUSCLE. - NO MALIGNANCY IDENTIFIED. 3. Esophagogastrectomy - ULCERATION WITH INFLAMMATION AND REACTIVE CHANGES. - FOCAL INTESTINAL METAPLASIA. - CHANGES CONSISTENT WITH TREATMENT EFFECT. - SEVEN OF SEVEN LYMPH NODES NEGATIVE FOR CARCINOMA (0/7). - INCIDENTAL LEIOMYOMA, 0.2 CM. - MARGINS ARE NEGATIVE FOR DYSPLASIA OR MALIGNANCY. - SEE ONCOLOGY TABLE. Microscopic Comment 3. ESOPHAGUS: Specimen: Esophagus and proximal stomach, cervical lymph node. Procedure: Esophagogastrectomy and cervical lymph node biopsy. Tumor Site: Presumed GE junction, see comment. Relationship of Tumor to esophagogastric junction: Can not be assessed. Distance of tumor center from esophagogastric junction: Can not be assessed. Tumor Size Greatest dimension: No residual tumor present. Histologic Type: Per medical record adenocarcinoma (no biopsy for review). Histologic Grade: N/A. Microscopic Tumor Extension: N/A. Margins: Negative. Treatment Effect: Present (TRS 0). Lymph-Vascular Invasion: Not identified. Perineural Invasion: Not identified. Lymph nodes: number examined 8; number positive:  0 TNM: ypT0, ypN0 see comment. 1 of 3 FINAL for JASH, WAHLEN ([id number]) Microscopic Comment(continued) Ancillary studies: None. Comments: The entire GE junction is submitted for evaluation. There is ulceration with associated inflammation. There is acellular mucin/myxoid changes which extend through the muscularis propria, but no viable/residual tumor is identified and thus the stage is ypT0. There  is focal background intestinal metaplasia, which may have been pre-existing (Barrett's esophagus) or related to therapy.   RADIOGRAPHIC STUDIES: I have personally reviewed the radiological images as listed and agreed with the findings in the report. No results found.   ASSESSMENT & PLAN: 77 y.o. male presented with dysphagia with solid food  1. 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 Sidor appears stable. He completed cycle 2 day 15 taxol, cyramza on 2/21. Therapy was changed to every other week due to significant fatigue, which remains stable. Weight and VS stable. CBC and CMP reviewed, adequate for treatment. CEA again dropped significantly, now 350. His bone metastasis is not measurable on PET scan. Proceed with cycle 3 day 1 taxol/cyramza today, return in 2 weeks for f/u and C3D15.   2. 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 Recurrent left pleural effusion -possibly related to esophagectomy s/p 3 repeated thoracentesis, cytology negative. This is also contributing to patient's fatigue. Pl effusions are stable on recent chest xray.  4. HTN -On atenolol; well controlled lately.   5. Malnutrition -cycles 3 cans with 250 ml tube feeding overnight for 12 hours with additional 700-750 ml water flushes. Weight is stable. He takes little by mouth due to decreased appetite and taste changes. Will f/u with dietician today in  infusion  6. Anorexia and depression, Insomnia -continue mirtazapine. Helps sleep more than appetite. He denies dysphagia, I encouraged him to eat and drink more in addition to J tube feeding. Will f/u with dietician today in infusion  7. Fatigue  -likely secondary to malignancy, chemotherapy, and anemia. Fatigue is significant but stable. Will proceed with chemo today, taxol and cyramza. This was a shared visit with Dr. Burr Medico who reviewed if his fatigue progresses and becomes debilitating, will likely hold taxol and give cyramza alone.   8. J tube leaking, skin breakdown -He saw Dr. Servando Snare in f/u last week, was told to use barrier cream for skin breakdown secondary to leaking bile and pancreatic juices. He will call IR again if he has further difficulties.  9. 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 Able to function well and independently. Will monitor.   PLAN: -Labs reviewed, proceed with cycle 3 day 1 taxol/cyramza -Return in 2 weeks for f/u and C3D15 -F/u with dietician today  All questions were answered. The patient knows to call the clinic with any problems, questions or concerns. No barriers to learning was detected. I spent 25 minutes counseling the patient face to face. The total time spent in the appointment was 35 minutes and more than 50% was on counseling and review of test results     Alla Feeling, NP 09/25/17   Addendum  I have seen the patient, examined him. I agree with the assessment and and plan and have edited the notes.   Mr. Janis is clinically stable. He is still quite fatigued, not able to do much at home.  He denies pain or other new symptoms.  Labs are stable, anemia has improved.  We discussed the option of holding Taxol, and continue Cyramza only, due to his fatigue.  Patient wishes to proceed with Taxol Cyramza today,  and make a decision about holding chemo on next visit in 2 weeks.  Truitt Merle  09/26/2017

## 2017-09-25 NOTE — Patient Instructions (Signed)

## 2017-09-25 NOTE — Progress Notes (Signed)
Nutrition follow-up completed with patient during infusion for esophageal cancer. Weight improved and documented as 133.4 pounds Patient continues to use 3-4 bottles of Nutren 1.5 via jejunostomy tube overnight.  He is receiving free water flushes every hour. Patient continues to eat small amounts of food and drink water throughout the day. Patient complains of fatigue.  There is no nutrition diagnosis at this time. I will continue to check on patient secondary to him using his jejunostomy tube regularly.  Patient's wife has my contact information for any questions or concerns.  **Disclaimer: This note was dictated with voice recognition software. Similar sounding words can inadvertently be transcribed and this note may contain transcription errors which may not have been corrected upon publication of note.**

## 2017-09-25 NOTE — Progress Notes (Signed)
Ok to treat today per Regan Rakers, NP

## 2017-09-25 NOTE — Patient Instructions (Signed)
[facility] Discharge Instructions for Patients Receiving Chemotherapy  Today you received the following chemotherapy agents: Paclitaxel (Taxol) and Ramucirumab (Cyramza).  To help prevent nausea and vomiting after your treatment, we encourage you to take your nausea medication as prescribed.  If you develop nausea and vomiting that is not controlled by your nausea medication, call the clinic.   BELOW ARE SYMPTOMS THAT SHOULD BE REPORTED IMMEDIATELY:  *FEVER GREATER THAN 100.5 F  *CHILLS WITH OR WITHOUT FEVER  NAUSEA AND VOMITING THAT IS NOT CONTROLLED WITH YOUR NAUSEA MEDICATION  *UNUSUAL SHORTNESS OF BREATH  *UNUSUAL BRUISING OR BLEEDING  TENDERNESS IN MOUTH AND THROAT WITH OR WITHOUT PRESENCE OF ULCERS  *URINARY PROBLEMS  *BOWEL PROBLEMS  UNUSUAL RASH Items with * indicate a potential emergency and should be followed up as soon as possible.  Feel free to call the clinic should you have any questions or concerns. The clinic phone number is [phone].  Please show the [facility] at check-in to the Emergency Department and triage nurse.

## 2017-10-08 ENCOUNTER — Other Ambulatory Visit: Payer: Non-veteran care

## 2017-10-08 ENCOUNTER — Other Ambulatory Visit: Payer: Self-pay | Admitting: Hematology

## 2017-10-08 ENCOUNTER — Inpatient Hospital Stay: Payer: Non-veteran care

## 2017-10-08 ENCOUNTER — Inpatient Hospital Stay (HOSPITAL_BASED_OUTPATIENT_CLINIC_OR_DEPARTMENT_OTHER): Payer: Non-veteran care | Admitting: Nurse Practitioner

## 2017-10-08 ENCOUNTER — Encounter: Payer: Self-pay | Admitting: Nurse Practitioner

## 2017-10-08 VITALS — BP 120/72 | HR 86 | Temp 97.7°F | Resp 18 | Ht 65.0 in | Wt 130.8 lb

## 2017-10-08 DIAGNOSIS — C155 Malignant neoplasm of lower third of esophagus: Secondary | ICD-10-CM | POA: Diagnosis not present

## 2017-10-08 DIAGNOSIS — R42 Dizziness and giddiness: Secondary | ICD-10-CM

## 2017-10-08 DIAGNOSIS — C7951 Secondary malignant neoplasm of bone: Secondary | ICD-10-CM

## 2017-10-08 DIAGNOSIS — R0602 Shortness of breath: Secondary | ICD-10-CM | POA: Diagnosis not present

## 2017-10-08 DIAGNOSIS — Z95828 Presence of other vascular implants and grafts: Secondary | ICD-10-CM

## 2017-10-08 DIAGNOSIS — Z5112 Encounter for antineoplastic immunotherapy: Secondary | ICD-10-CM | POA: Diagnosis not present

## 2017-10-08 DIAGNOSIS — R197 Diarrhea, unspecified: Secondary | ICD-10-CM

## 2017-10-08 LAB — CBC WITH DIFFERENTIAL (CANCER CENTER ONLY)
BASOS ABS: 0 10*3/uL (ref 0.0–0.1)
Basophils Relative: 1 %
Eosinophils Absolute: 0 10*3/uL (ref 0.0–0.5)
Eosinophils Relative: 1 %
HEMATOCRIT: 31.9 % — AB (ref 38.4–49.9)
HEMOGLOBIN: 10.8 g/dL — AB (ref 13.0–17.1)
LYMPHS ABS: 0.6 10*3/uL — AB (ref 0.9–3.3)
LYMPHS PCT: 14 %
MCH: 31.2 pg (ref 27.2–33.4)
MCHC: 33.9 g/dL (ref 32.0–36.0)
MCV: 92 fL (ref 79.3–98.0)
Monocytes Absolute: 0.9 10*3/uL (ref 0.1–0.9)
Monocytes Relative: 20 %
NEUTROS ABS: 2.9 10*3/uL (ref 1.5–6.5)
NEUTROS PCT: 64 %
PLATELETS: 239 10*3/uL (ref 140–400)
RBC: 3.47 MIL/uL — AB (ref 4.20–5.82)
RDW: 20.2 % — ABNORMAL HIGH (ref 11.0–14.6)
WBC: 4.6 10*3/uL (ref 4.0–10.3)

## 2017-10-08 LAB — CMP (CANCER CENTER ONLY)
ALT: 16 U/L (ref 0–55)
ANION GAP: 9 (ref 3–11)
AST: 23 U/L (ref 5–34)
Albumin: 3.1 g/dL — ABNORMAL LOW (ref 3.5–5.0)
Alkaline Phosphatase: 195 U/L — ABNORMAL HIGH (ref 40–150)
BILIRUBIN TOTAL: 0.4 mg/dL (ref 0.2–1.2)
BUN: 11 mg/dL (ref 7–26)
CO2: 21 mmol/L — ABNORMAL LOW (ref 22–29)
Calcium: 8.9 mg/dL (ref 8.4–10.4)
Chloride: 101 mmol/L (ref 98–109)
Creatinine: 0.62 mg/dL — ABNORMAL LOW (ref 0.70–1.30)
GFR, Est AFR Am: >60 mL/min (ref >60)
Glucose, Bld: 108 mg/dL (ref 70–140)
POTASSIUM: 4.5 mmol/L (ref 3.5–5.1)
Sodium: 131 mmol/L — ABNORMAL LOW (ref 136–145)
TOTAL PROTEIN: 6.3 g/dL — AB (ref 6.4–8.3)

## 2017-10-08 LAB — D-DIMER, QUANTITATIVE: D-Dimer, Quant: 10.49 ug/mL-FEU — ABNORMAL HIGH (ref 0.00–0.50)

## 2017-10-08 MED ORDER — HEPARIN SOD (PORK) LOCK FLUSH 100 UNIT/ML IV SOLN
250.0000 [IU] | Freq: Once | INTRAVENOUS | Status: AC
  Administered 2017-10-08: 500 [IU]
  Filled 2017-10-08: qty 5

## 2017-10-08 MED ORDER — SODIUM CHLORIDE 0.9% FLUSH
10.0000 mL | Freq: Once | INTRAVENOUS | Status: AC
  Administered 2017-10-08: 10 mL
  Filled 2017-10-08: qty 10

## 2017-10-08 NOTE — Progress Notes (Signed)
[place]  [phone]) [phone] Fax:([phone]) [phone]  Clinic Follow up Note   Patient Care Team: [name], MD as PCP - General (Internal Medicine) Truitt Merle, MD as Consulting Physician (Hematology) Kyung Rudd, MD as Consulting Physician (Radiation Oncology) [name], MD as Consulting Physician (Cardiothoracic Surgery) Karie Mainland, RD as Dietitian (Nutrition) Minus Breeding, MD as Consulting Physician (Cardiology) 10/08/2017  SUMMARY OF ONCOLOGIC HISTORY: Oncology History   Presented with solid dysphagia at least daily with chest pain. Involuntary weight loss of 10-12 lb over past year  Malignant neoplasm of lower third of esophagus (HCC)   Staging form: Esophagus - Adenocarcinoma, AJCC 7th Edition   - Clinical stage from 12/15/2015: Stage IIIA (T3, N1, M0) - Signed by Truitt Merle, MD on 12/24/2015   - Pathologic stage from 04/26/2016: yT0, N0, cM0, GX - Signed by [name], MD on 04/27/2016      Malignant neoplasm of lower third of esophagus ([place])   11/23/2015 Procedure    EGD per [facility](Dr. Toledo):6cm mass in lower third of esophagus      11/24/2015 Initial Diagnosis    Esophageal cancer ([place])      11/24/2015 Pathology Results    Mucinous adenocarcinoma, moderately differentiated-Her2 analysis pending      12/05/2015 Imaging    PET scan showed hypermetabolic a mass in distal esophagus, hypermetabolic activity in the left anterior prostate gland, metabolic density in left adrenal nodule, CT attenuation suggest a benign adrenal adenoma. Left apical 2 mm lung nodule.      12/15/2015 Procedure    EUS showed a uT3N1 lesion from 27cm to 35cm from the incisors       12/26/2015 - 02/02/2016 Chemotherapy    Weekly carboplatin AUC 2 and Taxol 45 mg/m, with concurrent radiation      12/26/2015 - 02/02/2016 Radiation Therapy    Neoadjuvant chemoradiation to the esophageal cancer      04/23/2016 Surgery    total  esophagectomy with cervical esophagogastrostomy, pyloroplasty, feeding jejunostomy       04/23/2016 Pathology Results    Esophagectomy showed no residual tumor, 8 lymph nodes were all negative, incidental leiomyoma 0.2 cm      08/14/2016 PET scan    IMPRESSION: 1. Small hypermetabolic left adrenal mass, this has not changed in size compared to 12/01/15 but has a maximum standard uptake value of 6.2 (previously 4.2). Indeterminate density and MRI characteristics, not specific for adenoma. Early metastatic disease is not readily excluded although the lack of change of size is somewhat reassuring. Surveillance recommended. 2. No other hypermetabolic lesions are identified. 3. Moderate to large left pleural effusion with passive atelectasis. 4. Esophagectomy with gastric pull-through. 5. No hypermetabolic liver lesions are seen. 6. Enlarged prostate gland.      08/16/2016 - 08/23/2016 Hospital Admission    He was diagnosed with DIC, unclear etiology. He was given blood products. He underwent echocardiogram which was negative for endocarditis, CTA chest/abd which was negative for aneurysm or dissection as work up for DIC. He was on prednisone for some time, but stopped as it was not helping much for DIC.       08/28/2016 - 09/01/2016 Hospital Admission    He has had issues with recurrent L sided pleural effusions, DIC and melena. He was last admitted in 1/25, given supportive care with a thoracentesis and blood transfusion. Dr Burr Medico has been following DIC and has requested a direct admit for melena, anemia and thrombocytopenia. GI called for EGD.  08/31/2016 Pathology Results    Bone Marrow Biopsy, right iliac - METASTATIC ADENOCARCINOMA. - SEE COMMENT. PERIPHERAL BLOOD: - NORMOCYTIC ANEMIA. - THROMBOCYTOPENIA.      08/31/2016 Progression    Bone marrow biopsy showed metastatic adenocarcinoma      09/04/2016 - 06/26/2017 Chemotherapy    FOLFOX every 2 weeks; changed to maintenance 5-FU  every 2 weeks starting 02/06/17. Stopped after 06/26/17 due to significant increase in CEA.       09/19/2016 Procedure    Therapeutic thoracentesis of a left pleural effusion performed on 09/19/16 yielded 1.5 liters of blood-tinged fluid. Reactive appearing mesothelial cells were present.      11/12/2016 PET scan     IMPRESSION: 1. Heterogeneous bony uptake with several mildly avid foci primarily in the lower thoracic spine, lumbar spine, and pelvic bones consistent with known bony metastatic disease. 2. There is new uptake in the right adrenal gland not seen previously with no CT correlate. Recommend attention on follow-up. 3. A rounded focus of uptake in the anterior peripheral left prostate is larger in the interval. The findings are nonspecific and could be infectious, inflammatory, or neoplastic. Focal prostatitis or prostate cancer are most likely. 4. The uptake in the left adrenal gland has decreased in the interval. 5. Mild uptake in the bilateral hila may simply represent reactive nodes. Recommend attention on follow-up. 6. The left-sided pleural effusion is a little larger in the interval with loculated components.      02/20/2017 PET scan    PET 02/20/17 IMPRESSION: 1. No activity suspicious for metastatic adenocarcinoma status post esophagectomy and gastric pull-through. 2. Stable mild prominence of the left adrenal gland and associated hypermetabolic activity from several prior studies. The lack of change implies a benign etiology. No abnormal activity within the right adrenal gland. 3. Persistent focal activity peripherally in the left lobe of the prostate, unchanged from the most recent study. Appearance is nonspecific, and could reflect prostate cancer or a benign nodule. Focal prostatitis less likely given persistence. 4. Stable loculated left pleural effusion and left basilar atelectasis. 5. These results were called by telephone at the time of interpretation on  02/20/2017 at 1:47 pm to Dr. Truitt Merle , who verbally acknowledged these results.      05/23/2017 PET scan    PET  IMPRESSION: 1. No significant change compared to 02/19/2017. 2. No specific findings to suggest metastatic disease in this patient who is status post esophagectomy and gastric pull-through. 3. Left adrenal hypermetabolism and nodularity are unchanged over multiple prior exams, again favoring a benign etiology. 4. Left anterior prostatic hypermetabolism is similar and should be correlated with PSA and possibly prostate MRI, if not performed. 5. Left larger than right pleural effusions, with left-sided mild loculation. 6. Left nephrolithiasis. 7. Coronary artery atherosclerosis. Aortic Atherosclerosis      07/19/2017 -  Chemotherapy    Started second line therapy with Taxol on day 1,8 and 15 and ramucirumab on day 1 and 15, every 28 days starting 07/19/17     CURRENT THERAPY: second line therapy with Taxolon day 1,8 and 15and ramucirumab on day 1 and 15, every 28 days starting 07/19/17, due to significant fatigue I reduced Taxol to every 2 weeks starting 09/12/17.  INTERVAL HISTORY: Mr. Faux returns for follow up as scheduled prior to cycle 3 day 15 taxol/cyramza. He completed cycle 3 day 1 on 09/25/17. He remains very fatigued with low energy, mostly chair-bound. Reports stable dyspnea on standing and intermittent dry cough. No chest pain.  3-4 days ago he developed intermittent dizziness, no fall or syncope. Within the last week he began using vicks nose spray and cold medicine for nasal congestion. Notes occasional small nosebleeds. No fever or chills. Appetite and taste remain low, eating and drinking "okay." continues to cycle J tube feeds at night for 12 hours. J tube with malodorous yellow drainage somewhat improved, not bloody; redness surrounding J tube site also improving. Had 3 episodes of diarrhea in last 24 hours, has not taken imodium.    MEDICAL HISTORY:  Past  Medical History:  Diagnosis Date  . Esophageal cancer ([place]) 11/23/15   lower 3rd esohagus   . GERD (gastroesophageal reflux disease)   . Hyperlipidemia   . Hypertension     SURGICAL HISTORY: Past Surgical History:  Procedure Laterality Date  . CHEST TUBE INSERTION Left 04/23/2016   Procedure: CHEST TUBE INSERTION;  Surgeon: [name], MD;  Location: [place];  Service: Thoracic;  Laterality: Left;  . COLONOSCOPY N/A 08/30/2016   Procedure: COLONOSCOPY;  Surgeon: Milus Banister, MD;  Location: [facility];  Service: Endoscopy;  Laterality: N/A;  . COMPLETE ESOPHAGECTOMY N/A 04/23/2016   Procedure: TRANSHIATIAL TOTAL ESOPHAGECTOMY COMPLETE; CERVICAL ESOPHAGOGASTROSTOMY AND PYLOROPLASTY;  Surgeon: [name], MD;  Location: [place] [facility];  Service: Thoracic;  Laterality: N/A;  . cystoscope  4/12/1   prostate  . ERCP    . ESOPHAGOGASTRODUODENOSCOPY (EGD) WITH PROPOFOL N/A 08/29/2016   Procedure: ESOPHAGOGASTRODUODENOSCOPY (EGD) WITH PROPOFOL;  Surgeon: Milus Banister, MD;  Location: [facility];  Service: Endoscopy;  Laterality: N/A;  . INGUINAL HERNIA REPAIR    . IR GENERIC HISTORICAL  08/31/2016   IR FLUORO GUIDE PORT INSERTION RIGHT 08/31/2016 Corrie Mckusick, DO [facility]-INTERV RAD  . IR GENERIC HISTORICAL  08/31/2016   IR US GUIDE VASC ACCESS RIGHT 08/31/2016 Corrie Mckusick, DO [facility]  . IR REPLC DUODEN/JEJUNO TUBE PERCUT W/FLUORO  09/06/2017  . JEJUNOSTOMY N/A 04/23/2016   Procedure: FEEDING JEJUNOSTOMY;  Surgeon: [name], MD;  Location: [facility];  Service: Thoracic;  Laterality: N/A;  . LEG SURGERY Left    hole  . PROSTATE BIOPSY  10/05/15  . right shoulder surgery    . VIDEO BRONCHOSCOPY N/A 04/23/2016   Procedure: VIDEO BRONCHOSCOPY;  Surgeon: [name], MD;  Location: [facility] OR;  Service: Thoracic;  Laterality: N/A;    I have reviewed the social history and family history with the patient and they are unchanged from previous note.  ALLERGIES:  is allergic to no known  allergies.  MEDICATIONS:  Current Outpatient Medications  Medication Sig Dispense Refill  . acetaminophen (TYLENOL) 325 MG tablet Take 650 mg by mouth every 6 (six) hours as needed.    Marland Kitchen atenolol (TENORMIN) 25 MG tablet Take 1 tab in the morning and half tab in the evening (Patient taking differently: Take 12.5-25 mg by mouth 2 (two) times daily. Take 14m in the morning and 12.550min the evening) 60 tablet 1  . folic acid (FOLVITE) 1 MG tablet Take 1 tablet (1 mg total) by mouth daily. 30 tablet 0  . lidocaine-prilocaine (EMLA) cream Apply 1 application topically as needed. Apply 1-2 tsp over port site 1 hour prior to chemotherapy 30 g 1  . mirtazapine (REMERON) 15 MG tablet Take 1 tablet (15 mg total) by mouth at bedtime. 30 tablet 2  . Nutritional Supplements (NUTREN 1.5) LIQD 250 mLs by Jejunal Tube route at bedtime. 3 CANS VIA FEEDING TUBE    . ondansetron (ZOFRAN  ODT) 8 MG disintegrating tablet Take 1 tablet (8 mg total) by mouth every 8 (eight) hours as needed for nausea or vomiting. 30 tablet 2  . prochlorperazine (COMPAZINE) 10 MG tablet Take 1 tablet (10 mg total) by mouth every 6 (six) hours as needed for nausea or vomiting. 30 tablet 3  . vitamin B-12 1000 MCG tablet Take 1 tablet (1,000 mcg total) by mouth daily. 30 tablet 0  . oxycodone (OXY-IR) 5 MG capsule Take 5 mg by mouth every 6 (six) hours as needed for pain.      No current facility-administered medications for this visit.     PHYSICAL EXAMINATION: ECOG PERFORMANCE STATUS: 3-4  Vitals:   10/08/17 1207  BP: 120/72  Pulse: 86  Resp: 18  Temp: 97.7 F (36.5 C)  SpO2: 100%   Filed Weights   10/08/17 1207  Weight: 130 lb 12.8 oz (59.3 kg)    GENERAL:alert, no distress and comfortable. Presents in wheelchair  SKIN: skin color, texture, turgor are normal, no rashes or significant lesions EYES: normal, Conjunctiva are pink and non-injected, sclera clear OROPHARYNX:no exudate, no erythema and lips, buccal mucosa,  and tongue normal  LYMPH:  no palpable cervical or supraclavicular lymphadenopathy LUNGS: decreased breath sounds throughout with normal breathing effort HEART: regular rate & rhythm and no murmurs and no lower extremity edema ABDOMEN:abdomen soft, non-tender and normal bowel sounds. J tube site to left abdomen, dressing c/d/i Musculoskeletal:no cyanosis of digits and no clubbing  NEURO: alert & oriented x 3 with fluent speech, no focal motor/sensory deficits PAC without erythema  LABORATORY DATA:  I have reviewed the data as listed CBC Latest Ref Rng & Units 10/08/2017 09/25/2017 09/12/2017  WBC 4.0 - 10.3 K/uL 4.6 4.7 8.3  Hemoglobin 13.0 - 17.1 g/dL - - -  Hematocrit 38.4 - 49.9 % 31.9(L) 31.8(L) 31.5(L)  Platelets 140 - 400 K/uL 239 191 174     CMP Latest Ref Rng & Units 10/08/2017 09/25/2017 09/12/2017  Glucose 70 - 140 mg/dL 108 144(H) 112  BUN 7 - 26 mg/dL 11 11 12   Creatinine 0.70 - 1.30 mg/dL 0.62(L) 0.63(L) 0.62(L)  Sodium 136 - 145 mmol/L 131(L) 132(L) 133(L)  Potassium 3.5 - 5.1 mmol/L 4.5 4.7 4.7  Chloride 98 - 109 mmol/L 101 102 102  CO2 22 - 29 mmol/L 21(L) 21(L) 21(L)  Calcium 8.4 - 10.4 mg/dL 8.9 9.0 9.0  Total Protein 6.4 - 8.3 g/dL 6.3(L) 6.2(L) 6.4  Total Bilirubin 0.2 - 1.2 mg/dL 0.4 0.4 0.5  Alkaline Phos 40 - 150 U/L 195(H) 174(H) 184(H)  AST 5 - 34 U/L 23 24 27   ALT 0 - 55 U/L 16 18 18    CEA  02/20/2017: 2.89 05/15/2017: 57.0 06/12/2017: 255 06/26/2017: 632 07/19/2017: 3138 08/02/2017: 5016 08/21/17: 1480.02 09/25/17: 350.07   Pathology report [facility] TESTING 09/29/2016   PD-L1 Testing 09/29/2016   Diagnosis 09/27/2016 Consult- Comprehensive, [id number] - 1A Mass, Lower third of Esophagus, Mucosal Biopsy - ADENOCARCINOMA WITH EXTRACELLULAR MUCIN. - SEE COMMENT. Microscopic Comment Provided Her2 IHC with the appropriate controls is negative. Per report Her2 FISH is also negative. There is limited tissue remaining, but [facility] and PDL-1  testing will be attempted as requested.  Diagnosis 09/19/2016 PLEURAL FLUID, LEFT (SPECIMEN 1 OF 1 COLLECTED 09/19/16): REACTIVE APPEARING MESOTHELIAL CELLS PRESENT.  Diagnosis 08/31/2016 Bone Marrow Biopsy, right iliac - METASTATIC ADENOCARCINOMA. - SEE COMMENT. PERIPHERAL BLOOD: - NORMOCYTIC ANEMIA. - THROMBOCYTOPENIA.  Diagnosis 08/02/16 PLEURAL FLUID, LEFT (SPECIMEN 1 OF 1 COLLECTED  08/02/16): REACTIVE MESOTHELIAL CELLS PRESENT. LYMPHOCYTES PRESENT.  Diagnosis 11/23/2015 Mass, lower third of the esophagus, mucosal biopsy Mucinous adenocarcinoma, moderately differentiated.  Diagnosis 04/23/2016 1. Lymph node, biopsy, Cervical - ONE OF ONE LYMPH NODES NEGATIVE FOR CARCINOMA (0/1). 2. Lymph node, biopsy, Periesophageal - BLOOD WITH SCANT BENIGN SOFT TISSUE AND SKELETAL MUSCLE. - NO MALIGNANCY IDENTIFIED. 3. Esophagogastrectomy - ULCERATION WITH INFLAMMATION AND REACTIVE CHANGES. - FOCAL INTESTINAL METAPLASIA. - CHANGES CONSISTENT WITH TREATMENT EFFECT. - SEVEN OF SEVEN LYMPH NODES NEGATIVE FOR CARCINOMA (0/7). - INCIDENTAL LEIOMYOMA, 0.2 CM. - MARGINS ARE NEGATIVE FOR DYSPLASIA OR MALIGNANCY. - SEE ONCOLOGY TABLE. Microscopic Comment 3. ESOPHAGUS: Specimen: Esophagus and proximal stomach, cervical lymph node. Procedure: Esophagogastrectomy and cervical lymph node biopsy. Tumor Site: Presumed GE junction, see comment. Relationship of Tumor to esophagogastric junction: Can not be assessed. Distance of tumor center from esophagogastric junction: Can not be assessed. Tumor Size Greatest dimension: No residual tumor present. Histologic Type: Per medical record adenocarcinoma (no biopsy for review). Histologic Grade: N/A. Microscopic Tumor Extension: N/A. Margins: Negative. Treatment Effect: Present (TRS 0). Lymph-Vascular Invasion: Not identified. Perineural Invasion: Not identified. Lymph nodes: number examined 8; number positive: 0 TNM: ypT0, ypN0 see comment. 1 of  3 FINAL for JAMEL, HOLZMANN ([id number]) Microscopic Comment(continued) Ancillary studies: None. Comments: The entire GE junction is submitted for evaluation. There is ulceration with associated inflammation. There is acellular mucin/myxoid changes which extend through the muscularis propria, but no viable/residual tumor is identified and thus the stage is ypT0. There is focal background intestinal metaplasia, which may have been pre-existing (Barrett's esophagus) or related to therapy.   RADIOGRAPHIC STUDIES: I have personally reviewed the radiological images as listed and agreed with the findings in the report. No results found.   ASSESSMENT & PLAN: 77 y.o.male presented with dysphagia with solid food  1. 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 Dizziness  3. DIC, hemolytic anemia and thrombocytopenia, secondary to #1 4. Recurrent left pleural effusion, possibly related to esophagectomy s/p thoracentesis x3 - cytology negative  5. HTN 6. Malnutrition 7. Anorexia, depression, insomnia 8. Fatigue 9. J tube leak, skin breakdown 10. BPH, normal PSA 12/12/16 11. 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 Hamed appears stable. He completed cycle 3 day 1 taxol/cyramza on 09/25/17. His tolerance for chemo is fair, he has significant but stable fatigue and exertional dyspnea. He developed new onset dizziness which is likely related to recent cold medicine. I encouraged him to avoid OTC cold remedies including nasal spray to avoid nosebleeds. D-dimer today was elevated given his underlying malignancy, but improved from previous when he had DIC. I do not think he needs CTA at this point given his dyspnea is stable. I encouraged him to increase po liquids and try to remain active as tolerated. Will monitor closely.   VS and weight stable. Labs reviewed; CBC and  CMP adequate for treatment. We again discussed potential to hold taxol and proceed with cyramza alone, given his low performance status. He wants to continue chemo for now, given his tumor marker has decreased significantly; but acknowledges his quality of life is limited as he can not tolerate much activity. Will proceed with cycle 3 day 15 taxol/cyramza 3/20 as planned. Return for f/u and cycle 4 day 1 in 2 weeks.   PLAN: -Labs reviewed, proceed with cycle 3 day 15 taxol/cyramza 3/20 -Return in 2 weeks for f/u and cycle 4 day  1 -Imodium for diarrhea -Avoid OTC cold remedies  Orders Placed This Encounter  Procedures  . D-dimer, quantitative    Standing Status:   Future    Number of Occurrences:   1    Standing Expiration Date:   10/08/2018   All questions were answered. The patient knows to call the clinic with any problems, questions or concerns. No barriers to learning was detected. I spent 25 minutes counseling the patient face to face. The total time spent in the appointment was 30 minutes and more than 50% was on counseling and review of test results     Alla Feeling, NP 10/08/17

## 2017-10-09 ENCOUNTER — Inpatient Hospital Stay: Payer: Non-veteran care

## 2017-10-09 VITALS — BP 114/65 | HR 72 | Temp 98.6°F | Resp 18

## 2017-10-09 DIAGNOSIS — Z5112 Encounter for antineoplastic immunotherapy: Secondary | ICD-10-CM | POA: Diagnosis not present

## 2017-10-09 DIAGNOSIS — C155 Malignant neoplasm of lower third of esophagus: Secondary | ICD-10-CM

## 2017-10-09 MED ORDER — HEPARIN SOD (PORK) LOCK FLUSH 100 UNIT/ML IV SOLN
500.0000 [IU] | Freq: Once | INTRAVENOUS | Status: AC | PRN
  Administered 2017-10-09: 500 [IU]
  Filled 2017-10-09: qty 5

## 2017-10-09 MED ORDER — ACETAMINOPHEN 325 MG PO TABS
ORAL_TABLET | ORAL | Status: AC
  Filled 2017-10-09: qty 2

## 2017-10-09 MED ORDER — SODIUM CHLORIDE 0.9 % IV SOLN
80.0000 mg/m2 | Freq: Once | INTRAVENOUS | Status: AC
  Administered 2017-10-09: 132 mg via INTRAVENOUS
  Filled 2017-10-09: qty 22

## 2017-10-09 MED ORDER — DEXAMETHASONE SODIUM PHOSPHATE 10 MG/ML IJ SOLN
10.0000 mg | Freq: Once | INTRAMUSCULAR | Status: AC
  Administered 2017-10-09: 10 mg via INTRAVENOUS

## 2017-10-09 MED ORDER — SODIUM CHLORIDE 0.9 % IV SOLN
Freq: Once | INTRAVENOUS | Status: AC
  Administered 2017-10-09: 14:00:00 via INTRAVENOUS

## 2017-10-09 MED ORDER — SODIUM CHLORIDE 0.9 % IV SOLN
8.0000 mg/kg | Freq: Once | INTRAVENOUS | Status: AC
  Administered 2017-10-09: 500 mg via INTRAVENOUS
  Filled 2017-10-09: qty 50

## 2017-10-09 MED ORDER — DIPHENHYDRAMINE HCL 50 MG/ML IJ SOLN
50.0000 mg | Freq: Once | INTRAMUSCULAR | Status: AC
  Administered 2017-10-09: 50 mg via INTRAVENOUS

## 2017-10-09 MED ORDER — DEXAMETHASONE SODIUM PHOSPHATE 10 MG/ML IJ SOLN
INTRAMUSCULAR | Status: AC
  Filled 2017-10-09: qty 1

## 2017-10-09 MED ORDER — FAMOTIDINE IN NACL 20-0.9 MG/50ML-% IV SOLN
20.0000 mg | Freq: Once | INTRAVENOUS | Status: AC
  Administered 2017-10-09: 20 mg via INTRAVENOUS

## 2017-10-09 MED ORDER — SODIUM CHLORIDE 0.9% FLUSH
10.0000 mL | INTRAVENOUS | Status: DC | PRN
  Administered 2017-10-09: 10 mL
  Filled 2017-10-09: qty 10

## 2017-10-09 MED ORDER — DIPHENHYDRAMINE HCL 50 MG/ML IJ SOLN
INTRAMUSCULAR | Status: AC
  Filled 2017-10-09: qty 1

## 2017-10-09 MED ORDER — ACETAMINOPHEN 325 MG PO TABS
650.0000 mg | ORAL_TABLET | Freq: Once | ORAL | Status: AC
  Administered 2017-10-09: 650 mg via ORAL

## 2017-10-09 MED ORDER — FAMOTIDINE IN NACL 20-0.9 MG/50ML-% IV SOLN
INTRAVENOUS | Status: AC
  Filled 2017-10-09: qty 50

## 2017-10-09 NOTE — Patient Instructions (Signed)
[facility] Discharge Instructions for Patients Receiving Chemotherapy  Today you received the following chemotherapy agents: Paclitaxel (Taxol) and Ramucirumab (Cyramza).  To help prevent nausea and vomiting after your treatment, we encourage you to take your nausea medication as prescribed.  If you develop nausea and vomiting that is not controlled by your nausea medication, call the clinic.   BELOW ARE SYMPTOMS THAT SHOULD BE REPORTED IMMEDIATELY:  *FEVER GREATER THAN 100.5 F  *CHILLS WITH OR WITHOUT FEVER  NAUSEA AND VOMITING THAT IS NOT CONTROLLED WITH YOUR NAUSEA MEDICATION  *UNUSUAL SHORTNESS OF BREATH  *UNUSUAL BRUISING OR BLEEDING  TENDERNESS IN MOUTH AND THROAT WITH OR WITHOUT PRESENCE OF ULCERS  *URINARY PROBLEMS  *BOWEL PROBLEMS  UNUSUAL RASH Items with * indicate a potential emergency and should be followed up as soon as possible.  Feel free to call the clinic should you have any questions or concerns. The clinic phone number is [phone].  Please show the [place] at check-in to the Emergency Department and triage nurse.

## 2017-10-22 NOTE — Progress Notes (Addendum)
Fred [name]  [phone] [phone]  Clinic Follow up Note   Patient Care Team: [name], MD as PCP - General (Internal Medicine) Truitt Merle, MD as Consulting Physician (Hematology) Kyung Rudd, MD as Consulting Physician (Radiation Oncology) [name], MD as Consulting Physician (Cardiothoracic Surgery) Karie Mainland, RD as Dietitian (Nutrition) Minus Breeding, MD as Consulting Physician (Cardiology) 10/23/2017  SUMMARY OF ONCOLOGIC HISTORY: Oncology History   Presented with solid dysphagia at least daily with chest pain. Involuntary weight loss of 10-12 lb over past year  Malignant neoplasm of lower third of esophagus (HCC)   Staging form: Esophagus - Adenocarcinoma, AJCC 7th Edition   - Clinical stage from 12/15/2015: Stage IIIA (T3, N1, M0) - Signed by Truitt Merle, MD on 12/24/2015   - Pathologic stage from 04/26/2016: yT0, N0, cM0, GX - Signed by [name], MD on 04/27/2016      Malignant neoplasm of lower third of esophagus ([place])   11/23/2015 Procedure    EGD per [facility](Dr. Toledo):6cm mass in lower third of esophagus      11/24/2015 Initial Diagnosis    Esophageal cancer ([place])      11/24/2015 Pathology Results    Mucinous adenocarcinoma, moderately differentiated-Her2 analysis pending      12/05/2015 Imaging    PET scan showed hypermetabolic a mass in distal esophagus, hypermetabolic activity in the left anterior prostate gland, metabolic density in left adrenal nodule, CT attenuation suggest a benign adrenal adenoma. Left apical 2 mm lung nodule.      12/15/2015 Procedure    EUS showed a uT3N1 lesion from 27cm to 35cm from the incisors       12/26/2015 - 02/02/2016 Chemotherapy    Weekly carboplatin AUC 2 and Taxol 45 mg/m, with concurrent radiation      12/26/2015 - 02/02/2016 Radiation Therapy    Neoadjuvant chemoradiation to the esophageal cancer      04/23/2016 Surgery    total  esophagectomy with cervical esophagogastrostomy, pyloroplasty, feeding jejunostomy       04/23/2016 Pathology Results    Esophagectomy showed no residual tumor, 8 lymph nodes were all negative, incidental leiomyoma 0.2 cm      08/14/2016 PET scan    IMPRESSION: 1. Small hypermetabolic left adrenal mass, this has not changed in size compared to 12/01/15 but has a maximum standard uptake value of 6.2 (previously 4.2). Indeterminate density and MRI characteristics, not specific for adenoma. Early metastatic disease is not readily excluded although the lack of change of size is somewhat reassuring. Surveillance recommended. 2. No other hypermetabolic lesions are identified. 3. Moderate to large left pleural effusion with passive atelectasis. 4. Esophagectomy with gastric pull-through. 5. No hypermetabolic liver lesions are seen. 6. Enlarged prostate gland.      08/16/2016 - 08/23/2016 Hospital Admission    He was diagnosed with DIC, unclear etiology. He was given blood products. He underwent echocardiogram which was negative for endocarditis, CTA chest/abd which was negative for aneurysm or dissection as work up for DIC. He was on prednisone for some time, but stopped as it was not helping much for DIC.       08/28/2016 - 09/01/2016 Hospital Admission    He has had issues with recurrent L sided pleural effusions, DIC and melena. He was last admitted in 1/25, given supportive care with a thoracentesis and blood transfusion. Dr Burr Medico has been following DIC and has requested a direct admit for melena, anemia and thrombocytopenia. GI called for EGD.  08/31/2016 Pathology Results    Bone Marrow Biopsy, right iliac - METASTATIC ADENOCARCINOMA. - SEE COMMENT. PERIPHERAL BLOOD: - NORMOCYTIC ANEMIA. - THROMBOCYTOPENIA.      08/31/2016 Progression    Bone marrow biopsy showed metastatic adenocarcinoma      09/04/2016 - 06/26/2017 Chemotherapy    FOLFOX every 2 weeks; changed to maintenance 5-FU  every 2 weeks starting 02/06/17. Stopped after 06/26/17 due to significant increase in CEA.       09/19/2016 Procedure    Therapeutic thoracentesis of a left pleural effusion performed on 09/19/16 yielded 1.5 liters of blood-tinged fluid. Reactive appearing mesothelial cells were present.      11/12/2016 PET scan     IMPRESSION: 1. Heterogeneous bony uptake with several mildly avid foci primarily in the lower thoracic spine, lumbar spine, and pelvic bones consistent with known bony metastatic disease. 2. There is new uptake in the right adrenal gland not seen previously with no CT correlate. Recommend attention on follow-up. 3. A rounded focus of uptake in the anterior peripheral left prostate is larger in the interval. The findings are nonspecific and could be infectious, inflammatory, or neoplastic. Focal prostatitis or prostate cancer are most likely. 4. The uptake in the left adrenal gland has decreased in the interval. 5. Mild uptake in the bilateral hila may simply represent reactive nodes. Recommend attention on follow-up. 6. The left-sided pleural effusion is a little larger in the interval with loculated components.      02/20/2017 PET scan    PET 02/20/17 IMPRESSION: 1. No activity suspicious for metastatic adenocarcinoma status post esophagectomy and gastric pull-through. 2. Stable mild prominence of the left adrenal gland and associated hypermetabolic activity from several prior studies. The lack of change implies a benign etiology. No abnormal activity within the right adrenal gland. 3. Persistent focal activity peripherally in the left lobe of the prostate, unchanged from the most recent study. Appearance is nonspecific, and could reflect prostate cancer or a benign nodule. Focal prostatitis less likely given persistence. 4. Stable loculated left pleural effusion and left basilar atelectasis. 5. These results were called by telephone at the time of interpretation on  02/20/2017 at 1:47 pm to Dr. Truitt Merle , who verbally acknowledged these results.      05/23/2017 PET scan    PET  IMPRESSION: 1. No significant change compared to 02/19/2017. 2. No specific findings to suggest metastatic disease in this patient who is status post esophagectomy and gastric pull-through. 3. Left adrenal hypermetabolism and nodularity are unchanged over multiple prior exams, again favoring a benign etiology. 4. Left anterior prostatic hypermetabolism is similar and should be correlated with PSA and possibly prostate MRI, if not performed. 5. Left larger than right pleural effusions, with left-sided mild loculation. 6. Left nephrolithiasis. 7. Coronary artery atherosclerosis. Aortic Atherosclerosis      07/19/2017 -  Chemotherapy    Started second line therapy with Taxol on day 1,8 and 15 and ramucirumab on day 1 and 15, every 28 days starting 07/19/17     CURRENT THERAPY: second line therapy with Taxolon day 1,8 and 15and ramucirumab on day 1 and 15, every 28 days starting 07/19/17, due to significant fatigue I reduced Taxol to every 2 weeks starting 09/12/17.   INTERVAL HISTORY: [name] returns for follow up as scheduled prior to cycle 4 taxol/cyramza. He completed cycle 3 day 15 on 10/09/17.  Continues to report severe fatigue, weakness, and dyspnea on standing that is slightly worse than his baseline.  He is chair  bound, unable to tolerate any more activity than walking to and from the bathroom at home.  His baseline productive cough with clear phlegm is unchanged.  No hemoptysis.  Denies chest pain or lower extremity swelling. Low grade fever at home 99.5 without chills. J tube site is improving, less drainage. Continues cyclic feeds at night. Appetite and taste are low. Does not drink much po. Has occasional mild diarrhea, one episode today. He does not take imodium.   REVIEW OF SYSTEMS:   Constitutional: Denies fevers, chills or abnormal weight loss (+) decreased  appetite, taste (+) severe fatigue Eyes: Denies blurriness of vision Ears, nose, mouth, throat, and face: Denies mucositis or sore throat Respiratory: Denies wheezes (+) occasional productive cough, clear phlegm, baseline (+) worsening dyspnea on standing Cardiovascular: Denies palpitation, chest discomfort or lower extremity swelling Gastrointestinal:  Denies nausea, vomiting, constipation, abdominal pain, bloating, distention, hematochezia, heartburn or change in bowel habits (+) intermittent mild diarrhea, 1 episode today Skin: Denies abnormal skin rashes Lymphatics: Denies new lymphadenopathy or easy bruising Neurological:Denies numbness, tingling, lightheadedness or dizziness (+) increased weakness Behavioral/Psych: Mood is stable, no new changes  All other systems were reviewed with the patient and are negative.  MEDICAL HISTORY:  Past Medical History:  Diagnosis Date  . Esophageal cancer ([place]) 11/23/15   lower 3rd esohagus   . GERD (gastroesophageal reflux disease)   . Hyperlipidemia   . Hypertension     SURGICAL HISTORY: Past Surgical History:  Procedure Laterality Date  . CHEST TUBE INSERTION Left 04/23/2016   Procedure: CHEST TUBE INSERTION;  Surgeon: [name], MD;  Location: [place];  Service: Thoracic;  Laterality: Left;  . COLONOSCOPY N/A 08/30/2016   Procedure: COLONOSCOPY;  Surgeon: Milus Banister, MD;  Location: [facility];  Service: Endoscopy;  Laterality: N/A;  . COMPLETE ESOPHAGECTOMY N/A 04/23/2016   Procedure: TRANSHIATIAL TOTAL ESOPHAGECTOMY COMPLETE; CERVICAL ESOPHAGOGASTROSTOMY AND PYLOROPLASTY;  Surgeon: [name], MD;  Location: [facility];  Service: Thoracic;  Laterality: N/A;  . cystoscope  4/12/1   prostate  . ERCP    . ESOPHAGOGASTRODUODENOSCOPY (EGD) WITH PROPOFOL N/A 08/29/2016   Procedure: ESOPHAGOGASTRODUODENOSCOPY (EGD) WITH PROPOFOL;  Surgeon: Milus Banister, MD;  Location: [facility];  Service: Endoscopy;  Laterality: N/A;  . INGUINAL  HERNIA REPAIR    . IR GENERIC HISTORICAL  08/31/2016   IR FLUORO GUIDE PORT INSERTION RIGHT 08/31/2016 Corrie Mckusick, DO [facility]-INTERV RAD  . IR GENERIC HISTORICAL  08/31/2016   IR US GUIDE VASC ACCESS RIGHT 08/31/2016 Corrie Mckusick, DO [facility]INTER[facility]  . IR REPLC DUODEN/JEJUNO TUBE PERCUT W/FLUORO  09/06/2017  . JEJUNOSTOMY N/A 04/23/2016   Procedure: FEEDING JEJUNOSTOMY;  Surgeon: [name], MD;  Location: [place];  Service: Thoracic;  Laterality: N/A;  . LEG SURGERY Left    hole  . PROSTATE BIOPSY  10/05/15  . right shoulder surgery    . VIDEO BRONCHOSCOPY N/A 04/23/2016   Procedure: VIDEO BRONCHOSCOPY;  Surgeon: [name], MD;  Location: [facility] OR;  Service: Thoracic;  Laterality: N/A;    I have reviewed the social history and family history with the patient and they are unchanged from previous note.  ALLERGIES:  is allergic to no known allergies.  MEDICATIONS:  Current Outpatient Medications  Medication Sig Dispense Refill  . acetaminophen (TYLENOL) 325 MG tablet Take 650 mg by mouth every 6 (six) hours as needed.    Marland Kitchen atenolol (TENORMIN) 25 MG tablet Take 1 tab in the morning and half tab in  the evening (Patient taking differently: Take 12.5-25 mg by mouth 2 (two) times daily. Take 66m in the morning and 12.576min the evening) 60 tablet 1  . folic acid (FOLVITE) 1 MG tablet Take 1 tablet (1 mg total) by mouth daily. 30 tablet 0  . lidocaine-prilocaine (EMLA) cream Apply 1 application topically as needed. Apply 1-2 tsp over port site 1 hour prior to chemotherapy 30 g 1  . mirtazapine (REMERON) 15 MG tablet Take 1 tablet (15 mg total) by mouth at bedtime. 30 tablet 2  . Nutritional Supplements (NUTREN 1.5) LIQD 250 mLs by Jejunal Tube route at bedtime. 3 CANS VIA FEEDING TUBE    . ondansetron (ZOFRAN ODT) 8 MG disintegrating tablet Take 1 tablet (8 mg total) by mouth every 8 (eight) hours as needed for nausea or vomiting. 30 tablet 2  . oxycodone (OXY-IR) 5 MG capsule Take 5 mg by mouth  every 6 (six) hours as needed for pain.     . Marland Kitchenrochlorperazine (COMPAZINE) 10 MG tablet Take 1 tablet (10 mg total) by mouth every 6 (six) hours as needed for nausea or vomiting. 30 tablet 3  . vitamin B-12 1000 MCG tablet Take 1 tablet (1,000 mcg total) by mouth daily. 30 tablet 0  . dexamethasone (DECADRON) 4 MG tablet Take 1 tablet (4 mg total) by mouth daily. 5 tablet 0   Current Facility-Administered Medications  Medication Dose Route Frequency Provider Last Rate Last Dose  . 0.9 %  sodium chloride infusion   Intravenous Continuous BuAlla FeelingNP       Facility-Administered Medications Ordered in Other Visits  Medication Dose Route Frequency Provider Last Rate Last Dose  . 0.9 %  sodium chloride infusion   Intravenous Once FeTruitt MerleMD      . heparin lock flush 100 unit/mL  500 Units Intracatheter Once PRN FeTruitt MerleMD      . ramucirumab [facility] in sodium chloride 0.9 % 200 mL chemo infusion  8 mg/kg (Treatment Plan Recorded) Intravenous Once FeTruitt MerleMD      . sodium chloride flush (NS) 0.9 % injection 10 mL  10 mL Intracatheter PRN FeTruitt MerleMD        PHYSICAL EXAMINATION: ECOG PERFORMANCE STATUS: 3-4  Vitals:   10/23/17 1243  BP: (!) 121/53  Pulse: 94  Resp: 18  Temp: 98.2 F (36.8 C)  SpO2: 100%   Filed Weights   10/23/17 1243  Weight: 130 lb 6.4 oz (59.1 kg)    GENERAL:alert, no distress and comfortable, presents in wheelchair  SKIN: skin color, texture, turgor are normal, no rashes or significant lesions EYES: normal, Conjunctiva are pink and non-injected, sclera clear OROPHARYNX:no exudate, no erythema and lips, buccal mucosa, and tongue normal  LYMPH:  no palpable cervical, supraclavicular, or axillary lymphadenopathy LUNGS: decreased breath sounds with normal breathing effort HEART: regular rate & rhythm and no murmurs and no lower extremity edema ABDOMEN:abdomen soft, round, non-tender and normal bowel sounds (+) J tube site to left abdomen,  dressing c/d/i Musculoskeletal:no cyanosis of digits and no clubbing  NEURO: alert & oriented x 3 with fluent speech, no focal motor/sensory deficits PAC without erythema   LABORATORY DATA:  I have reviewed the data as listed CBC Latest Ref Rng & Units 10/23/2017 10/08/2017 09/25/2017  WBC 4.0 - 10.3 K/uL 4.7 4.6 4.7  Hemoglobin 13.0 - 17.1 g/dL - - -  Hematocrit 38.4 - 49.9 % 31.3(L) 31.9(L) 31.8(L)  Platelets 140 - 400 K/uL  264 239 191     CMP Latest Ref Rng & Units 10/23/2017 10/08/2017 09/25/2017  Glucose 70 - 140 mg/dL 114 108 144(H)  BUN 7 - 26 mg/dL 11 11 11   Creatinine 0.70 - 1.30 mg/dL 0.59(L) 0.62(L) 0.63(L)  Sodium 136 - 145 mmol/L 130(L) 131(L) 132(L)  Potassium 3.5 - 5.1 mmol/L 4.8 4.5 4.7  Chloride 98 - 109 mmol/L 101 101 102  CO2 22 - 29 mmol/L 23 21(L) 21(L)  Calcium 8.4 - 10.4 mg/dL 8.8 8.9 9.0  Total Protein 6.4 - 8.3 g/dL 6.2(L) 6.3(L) 6.2(L)  Total Bilirubin 0.2 - 1.2 mg/dL 0.4 0.4 0.4  Alkaline Phos 40 - 150 U/L 186(H) 195(H) 174(H)  AST 5 - 34 U/L 22 23 24   ALT 0 - 55 U/L 17 16 18    CEA  02/20/2017: 2.89 05/15/2017: 57.0 06/12/2017: 255 06/26/2017: 632 07/19/2017: 3138 08/02/2017: 5016 08/21/17: 1480.02 09/25/17: 350.07 10/23/17: 160.61  Pathology report [facility] 09/29/2016   PD-L1 Testing 09/29/2016   Diagnosis 09/27/2016 Consult- Comprehensive, [id number] - 1A Mass, Lower third of Esophagus, Mucosal Biopsy - ADENOCARCINOMA WITH EXTRACELLULAR MUCIN. - SEE COMMENT. Microscopic Comment Provided Her2 IHC with the appropriate controls is negative. Per report Her2 FISH is also negative. There is limited tissue remaining, but [facility] and PDL-1 testing will be attempted as requested.     RADIOGRAPHIC STUDIES: I have personally reviewed the radiological images as listed and agreed with the findings in the report. No results found.   ASSESSMENT & PLAN: 77 y.o.male presented with dysphagia with solid food  1. 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 Dizziness - resolved  3. DIC, hemolytic anemia and thrombocytopenia, secondary to #1 4. Recurrent left pleural effusion, possibly related to esophagectomy s/p thoracentesis x3 - cytology negative  5. HTN 6. Malnutrition 7. Anorexia, depression, insomnia 8. Fatigue 9. J tube leak, skin breakdown 10. BPH, normal PSA 12/12/16 11. 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 Dyspnea on exertion/standing 13. Dehydration   Mr. Peddie appears stable. He completed 3 cycles taxol/cyramza on 10/09/17. He has significant fatigue and weakness with increased dyspnea on standing. His limited performance status is negatively affecting his quality of life, but he wants to continue treatment in general. I recommend short course of steroids to see if his fatigue improves, will take decadron 1 tab daily x5 days. I ordered CTA to rule out PE or dissection and echo to check EF and heart function. The patient was seen with Dr. Burr Medico, who recommends additional labs to rule out endocrine cause of his dyspnea and fatigue. CBC and CMP reviewed, stable and adequate for treatment. He appears clinically dehydrated with hyponatremia. Will give 1 L NS today with his treatment; I encouraged him to drink more.   Given his poor tolerance to systemic chemo, I recommend to hold Taxol and proceed with cyramza alone, premeds adjusted.  He has concerns about further treatment options if immunotherapy is not enough to control his disease; Dr. Burr Medico was present to answer questions. His CEA has responded very well thus far, again decreased today to 160. Will continue to monitor clinically and with tumor marker.   PLAN: -Labs reviewed; given overall poor tolerance to chemo, hold taxol and proceed with cyramza alone -Premeds adjusted for cyramza only -1 L NS over 2 hours today with cyrmaza    -Endocrine labs today: cortisol, testosterone, TSH, T4 -CTA, Echo ordered  -Prescription: decadron 4  mg one tab daily x5 days    Orders Placed This Encounter  Procedures  . CT Angio Chest/Abd/Pel for Dissection W and/or W/WO    Standing Status:   Future    Standing Expiration Date:   12/24/2018    Order Specific Question:   If indicated for the ordered procedure, I authorize the administration of contrast media per Radiology protocol    Answer:   Yes    Order Specific Question:   Preferred imaging location?    Answer:   [facility]    Order Specific Question:   Radiology Contrast Protocol - do NOT remove file path    Answer:   [email or web address]    Order Specific Question:   Reason for Exam additional comments    Answer:   esophageal cancer with worsening dyspnea  . ECHOCARDIOGRAM COMPLETE    Standing Status:   Future    Standing Expiration Date:   01/23/2019    Order Specific Question:   Where should this test be performed    Answer:   [place]    Order Specific Question:   Perflutren DEFINITY (image enhancing agent) should be administered unless hypersensitivity or allergy exist    Answer:   Administer Perflutren    Order Specific Question:   Expected Date:    Answer:   ASAP   All questions were answered. The patient knows to call the clinic with any problems, questions or concerns. No barriers to learning was detected. I spent 25 minutes counseling the patient face to face. The total time spent in the appointment was 30 minutes and more than 50% was on counseling and review of test results     Alla Feeling, NP 10/23/17   Addendum I have seen the patient, examined him. I agree with the assessment and and plan and have edited the notes.   [place] is still very fatigued, barely able to walk for more than 5-10 minutes.  This has not improved since we changed his chemo to every 2 weeks.  He does have dyspnea on mild exertion.  His weight is  stable.  I will hold Taxol today, continue Cyramza.  Will obtain a CT chest to rule out PE, and an echocardiogram to evaluate his heart function.  I will also check his thyroid function, testosterone level, and random cortisol level, to rule out hypothyroidism, hypotetosteronemia or adrenal insufficiency.  We will give him a course of steroids, to see if his energy improves.  His tumor marker CEA continues decreasing, indicating good cancer control. If above workup is negative, I may hold Cyramza also, to see if his symptoms improve. Will see him back in 2 weeks.  Truitt Merle  10/23/2017

## 2017-10-23 ENCOUNTER — Encounter: Payer: Self-pay | Admitting: Nurse Practitioner

## 2017-10-23 ENCOUNTER — Inpatient Hospital Stay: Payer: Non-veteran care

## 2017-10-23 ENCOUNTER — Inpatient Hospital Stay: Payer: Non-veteran care | Attending: Hematology | Admitting: Nurse Practitioner

## 2017-10-23 VITALS — BP 121/53 | HR 94 | Temp 98.2°F | Resp 18 | Ht 65.0 in | Wt 130.4 lb

## 2017-10-23 DIAGNOSIS — E46 Unspecified protein-calorie malnutrition: Secondary | ICD-10-CM | POA: Diagnosis not present

## 2017-10-23 DIAGNOSIS — E871 Hypo-osmolality and hyponatremia: Secondary | ICD-10-CM | POA: Insufficient documentation

## 2017-10-23 DIAGNOSIS — R530 Neoplastic (malignant) related fatigue: Secondary | ICD-10-CM

## 2017-10-23 DIAGNOSIS — D65 Disseminated intravascular coagulation [defibrination syndrome]: Secondary | ICD-10-CM | POA: Diagnosis not present

## 2017-10-23 DIAGNOSIS — E785 Hyperlipidemia, unspecified: Secondary | ICD-10-CM | POA: Insufficient documentation

## 2017-10-23 DIAGNOSIS — Z9221 Personal history of antineoplastic chemotherapy: Secondary | ICD-10-CM | POA: Diagnosis not present

## 2017-10-23 DIAGNOSIS — T451X5S Adverse effect of antineoplastic and immunosuppressive drugs, sequela: Secondary | ICD-10-CM | POA: Insufficient documentation

## 2017-10-23 DIAGNOSIS — I700 Atherosclerosis of aorta: Secondary | ICD-10-CM | POA: Diagnosis not present

## 2017-10-23 DIAGNOSIS — R5383 Other fatigue: Secondary | ICD-10-CM | POA: Diagnosis not present

## 2017-10-23 DIAGNOSIS — R531 Weakness: Secondary | ICD-10-CM | POA: Diagnosis not present

## 2017-10-23 DIAGNOSIS — F329 Major depressive disorder, single episode, unspecified: Secondary | ICD-10-CM | POA: Insufficient documentation

## 2017-10-23 DIAGNOSIS — C155 Malignant neoplasm of lower third of esophagus: Secondary | ICD-10-CM

## 2017-10-23 DIAGNOSIS — E860 Dehydration: Secondary | ICD-10-CM | POA: Insufficient documentation

## 2017-10-23 DIAGNOSIS — K219 Gastro-esophageal reflux disease without esophagitis: Secondary | ICD-10-CM

## 2017-10-23 DIAGNOSIS — G4700 Insomnia, unspecified: Secondary | ICD-10-CM | POA: Diagnosis not present

## 2017-10-23 DIAGNOSIS — I10 Essential (primary) hypertension: Secondary | ICD-10-CM | POA: Insufficient documentation

## 2017-10-23 DIAGNOSIS — Z87891 Personal history of nicotine dependence: Secondary | ICD-10-CM | POA: Insufficient documentation

## 2017-10-23 DIAGNOSIS — I2510 Atherosclerotic heart disease of native coronary artery without angina pectoris: Secondary | ICD-10-CM | POA: Insufficient documentation

## 2017-10-23 DIAGNOSIS — E279 Disorder of adrenal gland, unspecified: Secondary | ICD-10-CM | POA: Diagnosis not present

## 2017-10-23 DIAGNOSIS — C7951 Secondary malignant neoplasm of bone: Secondary | ICD-10-CM

## 2017-10-23 DIAGNOSIS — R630 Anorexia: Secondary | ICD-10-CM | POA: Diagnosis not present

## 2017-10-23 DIAGNOSIS — Z95828 Presence of other vascular implants and grafts: Secondary | ICD-10-CM

## 2017-10-23 DIAGNOSIS — R509 Fever, unspecified: Secondary | ICD-10-CM | POA: Diagnosis not present

## 2017-10-23 DIAGNOSIS — Z923 Personal history of irradiation: Secondary | ICD-10-CM | POA: Insufficient documentation

## 2017-10-23 DIAGNOSIS — R0609 Other forms of dyspnea: Secondary | ICD-10-CM | POA: Diagnosis not present

## 2017-10-23 DIAGNOSIS — D589 Hereditary hemolytic anemia, unspecified: Secondary | ICD-10-CM | POA: Diagnosis not present

## 2017-10-23 DIAGNOSIS — Z5112 Encounter for antineoplastic immunotherapy: Secondary | ICD-10-CM | POA: Insufficient documentation

## 2017-10-23 DIAGNOSIS — Z931 Gastrostomy status: Secondary | ICD-10-CM | POA: Insufficient documentation

## 2017-10-23 DIAGNOSIS — N400 Enlarged prostate without lower urinary tract symptoms: Secondary | ICD-10-CM

## 2017-10-23 DIAGNOSIS — R439 Unspecified disturbances of smell and taste: Secondary | ICD-10-CM | POA: Insufficient documentation

## 2017-10-23 DIAGNOSIS — G620 Drug-induced polyneuropathy: Secondary | ICD-10-CM | POA: Diagnosis not present

## 2017-10-23 DIAGNOSIS — Z79899 Other long term (current) drug therapy: Secondary | ICD-10-CM | POA: Insufficient documentation

## 2017-10-23 DIAGNOSIS — Z452 Encounter for adjustment and management of vascular access device: Secondary | ICD-10-CM | POA: Diagnosis not present

## 2017-10-23 LAB — CMP (CANCER CENTER ONLY)
ALT: 17 U/L (ref 0–55)
ANION GAP: 6 (ref 3–11)
AST: 22 U/L (ref 5–34)
Albumin: 2.9 g/dL — ABNORMAL LOW (ref 3.5–5.0)
Alkaline Phosphatase: 186 U/L — ABNORMAL HIGH (ref 40–150)
BUN: 11 mg/dL (ref 7–26)
CHLORIDE: 101 mmol/L (ref 98–109)
CO2: 23 mmol/L (ref 22–29)
Calcium: 8.8 mg/dL (ref 8.4–10.4)
Creatinine: 0.59 mg/dL — ABNORMAL LOW (ref 0.70–1.30)
GFR, Estimated: >60 mL/min (ref >60)
Glucose, Bld: 114 mg/dL (ref 70–140)
POTASSIUM: 4.8 mmol/L (ref 3.5–5.1)
SODIUM: 130 mmol/L — AB (ref 136–145)
Total Bilirubin: 0.4 mg/dL (ref 0.2–1.2)
Total Protein: 6.2 g/dL — ABNORMAL LOW (ref 6.4–8.3)

## 2017-10-23 LAB — CBC WITH DIFFERENTIAL (CANCER CENTER ONLY)
Basophils Absolute: 0 10*3/uL (ref 0.0–0.1)
Basophils Relative: 1 %
Eosinophils Absolute: 0 10*3/uL (ref 0.0–0.5)
Eosinophils Relative: 1 %
HCT: 31.3 % — ABNORMAL LOW (ref 38.4–49.9)
HEMOGLOBIN: 10.4 g/dL — AB (ref 13.0–17.1)
LYMPHS ABS: 0.7 10*3/uL — AB (ref 0.9–3.3)
LYMPHS PCT: 16 %
MCH: 29.6 pg (ref 27.2–33.4)
MCHC: 33.3 g/dL (ref 32.0–36.0)
MCV: 89 fL (ref 79.3–98.0)
Monocytes Absolute: 1.1 10*3/uL — ABNORMAL HIGH (ref 0.1–0.9)
Monocytes Relative: 23 %
NEUTROS PCT: 59 %
Neutro Abs: 2.8 10*3/uL (ref 1.5–6.5)
Platelet Count: 264 10*3/uL (ref 140–400)
RBC: 3.52 MIL/uL — AB (ref 4.20–5.82)
RDW: 19.8 % — ABNORMAL HIGH (ref 11.0–14.6)
WBC: 4.7 10*3/uL (ref 4.0–10.3)

## 2017-10-23 LAB — CORTISOL: CORTISOL PLASMA: 7.5 ug/dL

## 2017-10-23 LAB — T4, FREE: FREE T4: 1.06 ng/dL (ref 0.61–1.12)

## 2017-10-23 LAB — CEA (IN HOUSE-CHCC): CEA (CHCC-In House): 160.61 ng/mL — ABNORMAL HIGH (ref 0.00–5.00)

## 2017-10-23 MED ORDER — SODIUM CHLORIDE 0.9% FLUSH
10.0000 mL | INTRAVENOUS | Status: DC | PRN
  Administered 2017-10-23: 10 mL
  Filled 2017-10-23: qty 10

## 2017-10-23 MED ORDER — SODIUM CHLORIDE 0.9 % IV SOLN
Freq: Once | INTRAVENOUS | Status: AC
  Administered 2017-10-23: 15:00:00 via INTRAVENOUS

## 2017-10-23 MED ORDER — SODIUM CHLORIDE 0.9% FLUSH
10.0000 mL | Freq: Once | INTRAVENOUS | Status: AC
  Administered 2017-10-23: 10 mL
  Filled 2017-10-23: qty 10

## 2017-10-23 MED ORDER — DEXAMETHASONE 4 MG PO TABS: 4.0000 mg | ORAL_TABLET | Freq: Every day | ORAL | 0 refills | Status: AC

## 2017-10-23 MED ORDER — SODIUM CHLORIDE 0.9 % IV SOLN: INTRAVENOUS | Status: DC

## 2017-10-23 MED ORDER — HEPARIN SOD (PORK) LOCK FLUSH 100 UNIT/ML IV SOLN
500.0000 [IU] | Freq: Once | INTRAVENOUS | Status: AC | PRN
  Administered 2017-10-23: 500 [IU]
  Filled 2017-10-23: qty 5

## 2017-10-23 MED ORDER — DIPHENHYDRAMINE HCL 50 MG/ML IJ SOLN
INTRAMUSCULAR | Status: AC
  Filled 2017-10-23: qty 1

## 2017-10-23 MED ORDER — SODIUM CHLORIDE 0.9 % IV SOLN
8.0000 mg/kg | Freq: Once | INTRAVENOUS | Status: AC
  Administered 2017-10-23: 500 mg via INTRAVENOUS
  Filled 2017-10-23: qty 50

## 2017-10-23 MED ORDER — DIPHENHYDRAMINE HCL 50 MG/ML IJ SOLN
50.0000 mg | Freq: Once | INTRAMUSCULAR | Status: AC
  Administered 2017-10-23: 50 mg via INTRAVENOUS

## 2017-10-23 NOTE — Patient Instructions (Signed)
[place] Discharge Instructions for Patients Receiving Chemotherapy  Today you received the following chemotherapy agents: Ramucirumab (Cyramza).  To help prevent nausea and vomiting after your treatment, we encourage you to take your nausea medication as prescribed.  If you develop nausea and vomiting that is not controlled by your nausea medication, call the clinic.   BELOW ARE SYMPTOMS THAT SHOULD BE REPORTED IMMEDIATELY:  *FEVER GREATER THAN 100.5 F  *CHILLS WITH OR WITHOUT FEVER  NAUSEA AND VOMITING THAT IS NOT CONTROLLED WITH YOUR NAUSEA MEDICATION  *UNUSUAL SHORTNESS OF BREATH  *UNUSUAL BRUISING OR BLEEDING  TENDERNESS IN MOUTH AND THROAT WITH OR WITHOUT PRESENCE OF ULCERS  *URINARY PROBLEMS  *BOWEL PROBLEMS  UNUSUAL RASH Items with * indicate a potential emergency and should be followed up as soon as possible.  Feel free to call the clinic should you have any questions or concerns. The clinic phone number is [phone].  Please show the [place] at check-in to the Emergency Department and triage nurse.

## 2017-10-24 LAB — TESTOSTERONE: Testosterone: 215 ng/dL — ABNORMAL LOW (ref 264–916)

## 2017-10-24 LAB — TSH: TSH: 3.164 u[IU]/mL (ref 0.320–4.118)

## 2017-10-29 ENCOUNTER — Telehealth: Payer: Self-pay | Admitting: Emergency Medicine

## 2017-10-29 NOTE — Telephone Encounter (Signed)
Spoke with pt in regards to upcoming CT angio/chest on 11/01/17@ 3:45pm and Echo on 4/15 @ 11:00am both @ [facility]. Pt verbalized understanding of both appointment day and time and instructions for CT scan.

## 2017-10-29 NOTE — Telephone Encounter (Signed)
error 

## 2017-11-01 ENCOUNTER — Other Ambulatory Visit: Payer: Self-pay | Admitting: Nurse Practitioner

## 2017-11-01 ENCOUNTER — Ambulatory Visit (HOSPITAL_COMMUNITY)
Admission: RE | Admit: 2017-11-01 | Discharge: 2017-11-01 | Disposition: A | Discharge status: Home / Self Care | Payer: Non-veteran care | Source: Ambulatory Visit | Attending: Nurse Practitioner | Admitting: Nurse Practitioner

## 2017-11-01 DIAGNOSIS — J90 Pleural effusion, not elsewhere classified: Secondary | ICD-10-CM | POA: Insufficient documentation

## 2017-11-01 DIAGNOSIS — C155 Malignant neoplasm of lower third of esophagus: Secondary | ICD-10-CM

## 2017-11-01 DIAGNOSIS — R0609 Other forms of dyspnea: Secondary | ICD-10-CM

## 2017-11-01 DIAGNOSIS — J9811 Atelectasis: Secondary | ICD-10-CM | POA: Diagnosis not present

## 2017-11-01 DIAGNOSIS — E279 Disorder of adrenal gland, unspecified: Secondary | ICD-10-CM | POA: Insufficient documentation

## 2017-11-01 MED ORDER — IOPAMIDOL (ISOVUE-370) INJECTION 76%
100.0000 mL | Freq: Once | INTRAVENOUS | Status: AC | PRN
  Administered 2017-11-01: 100 mL via INTRAVENOUS

## 2017-11-01 MED ORDER — HEPARIN SOD (PORK) LOCK FLUSH 100 UNIT/ML IV SOLN
500.0000 [IU] | Freq: Once | INTRAVENOUS | Status: AC
  Administered 2017-11-01: 500 [IU] via INTRAVENOUS

## 2017-11-01 MED ORDER — HEPARIN SOD (PORK) LOCK FLUSH 100 UNIT/ML IV SOLN
INTRAVENOUS | Status: AC
  Filled 2017-11-01: qty 5

## 2017-11-01 MED ORDER — IOPAMIDOL (ISOVUE-370) INJECTION 76%
INTRAVENOUS | Status: AC
  Filled 2017-11-01: qty 100

## 2017-11-04 ENCOUNTER — Ambulatory Visit (HOSPITAL_COMMUNITY)
Admission: RE | Admit: 2017-11-04 | Discharge: 2017-11-04 | Disposition: A | Discharge status: Home / Self Care | Payer: Non-veteran care | Source: Ambulatory Visit | Attending: Nurse Practitioner | Admitting: Nurse Practitioner

## 2017-11-04 DIAGNOSIS — I10 Essential (primary) hypertension: Secondary | ICD-10-CM | POA: Diagnosis not present

## 2017-11-04 DIAGNOSIS — I517 Cardiomegaly: Secondary | ICD-10-CM | POA: Insufficient documentation

## 2017-11-04 DIAGNOSIS — I351 Nonrheumatic aortic (valve) insufficiency: Secondary | ICD-10-CM | POA: Insufficient documentation

## 2017-11-04 DIAGNOSIS — J90 Pleural effusion, not elsewhere classified: Secondary | ICD-10-CM | POA: Insufficient documentation

## 2017-11-04 DIAGNOSIS — C155 Malignant neoplasm of lower third of esophagus: Secondary | ICD-10-CM

## 2017-11-04 DIAGNOSIS — R0609 Other forms of dyspnea: Secondary | ICD-10-CM | POA: Diagnosis not present

## 2017-11-04 DIAGNOSIS — E785 Hyperlipidemia, unspecified: Secondary | ICD-10-CM | POA: Insufficient documentation

## 2017-11-04 NOTE — Progress Notes (Signed)
  Echocardiogram 2D Echocardiogram has been performed.  Darlina Sicilian M 11/04/2017, 11:39 AM

## 2017-11-06 ENCOUNTER — Inpatient Hospital Stay: Payer: Non-veteran care

## 2017-11-06 ENCOUNTER — Encounter: Payer: Self-pay | Admitting: Hematology

## 2017-11-06 ENCOUNTER — Inpatient Hospital Stay (HOSPITAL_BASED_OUTPATIENT_CLINIC_OR_DEPARTMENT_OTHER): Payer: Non-veteran care | Admitting: Hematology

## 2017-11-06 ENCOUNTER — Telehealth: Payer: Self-pay | Admitting: Hematology

## 2017-11-06 VITALS — BP 107/57 | HR 95 | Temp 98.7°F | Resp 18 | Ht 65.0 in | Wt 131.1 lb

## 2017-11-06 DIAGNOSIS — E46 Unspecified protein-calorie malnutrition: Secondary | ICD-10-CM | POA: Diagnosis not present

## 2017-11-06 DIAGNOSIS — R509 Fever, unspecified: Secondary | ICD-10-CM | POA: Diagnosis not present

## 2017-11-06 DIAGNOSIS — Z79899 Other long term (current) drug therapy: Secondary | ICD-10-CM

## 2017-11-06 DIAGNOSIS — T451X5S Adverse effect of antineoplastic and immunosuppressive drugs, sequela: Secondary | ICD-10-CM

## 2017-11-06 DIAGNOSIS — R630 Anorexia: Secondary | ICD-10-CM | POA: Diagnosis not present

## 2017-11-06 DIAGNOSIS — R0609 Other forms of dyspnea: Secondary | ICD-10-CM

## 2017-11-06 DIAGNOSIS — K219 Gastro-esophageal reflux disease without esophagitis: Secondary | ICD-10-CM

## 2017-11-06 DIAGNOSIS — C7951 Secondary malignant neoplasm of bone: Secondary | ICD-10-CM

## 2017-11-06 DIAGNOSIS — E871 Hypo-osmolality and hyponatremia: Secondary | ICD-10-CM | POA: Diagnosis not present

## 2017-11-06 DIAGNOSIS — D589 Hereditary hemolytic anemia, unspecified: Secondary | ICD-10-CM

## 2017-11-06 DIAGNOSIS — Z9221 Personal history of antineoplastic chemotherapy: Secondary | ICD-10-CM

## 2017-11-06 DIAGNOSIS — C155 Malignant neoplasm of lower third of esophagus: Secondary | ICD-10-CM | POA: Diagnosis not present

## 2017-11-06 DIAGNOSIS — Z931 Gastrostomy status: Secondary | ICD-10-CM | POA: Diagnosis not present

## 2017-11-06 DIAGNOSIS — E279 Disorder of adrenal gland, unspecified: Secondary | ICD-10-CM | POA: Diagnosis not present

## 2017-11-06 DIAGNOSIS — I700 Atherosclerosis of aorta: Secondary | ICD-10-CM

## 2017-11-06 DIAGNOSIS — Z87891 Personal history of nicotine dependence: Secondary | ICD-10-CM

## 2017-11-06 DIAGNOSIS — G620 Drug-induced polyneuropathy: Secondary | ICD-10-CM | POA: Diagnosis not present

## 2017-11-06 DIAGNOSIS — Z95828 Presence of other vascular implants and grafts: Secondary | ICD-10-CM

## 2017-11-06 DIAGNOSIS — F329 Major depressive disorder, single episode, unspecified: Secondary | ICD-10-CM

## 2017-11-06 DIAGNOSIS — I10 Essential (primary) hypertension: Secondary | ICD-10-CM

## 2017-11-06 DIAGNOSIS — E785 Hyperlipidemia, unspecified: Secondary | ICD-10-CM

## 2017-11-06 DIAGNOSIS — Z5112 Encounter for antineoplastic immunotherapy: Secondary | ICD-10-CM | POA: Diagnosis not present

## 2017-11-06 DIAGNOSIS — G4700 Insomnia, unspecified: Secondary | ICD-10-CM

## 2017-11-06 DIAGNOSIS — I2510 Atherosclerotic heart disease of native coronary artery without angina pectoris: Secondary | ICD-10-CM

## 2017-11-06 DIAGNOSIS — D65 Disseminated intravascular coagulation [defibrination syndrome]: Secondary | ICD-10-CM

## 2017-11-06 DIAGNOSIS — Z923 Personal history of irradiation: Secondary | ICD-10-CM

## 2017-11-06 DIAGNOSIS — N400 Enlarged prostate without lower urinary tract symptoms: Secondary | ICD-10-CM

## 2017-11-06 LAB — CBC WITH DIFFERENTIAL (CANCER CENTER ONLY)
Basophils Absolute: 0 10*3/uL (ref 0.0–0.1)
Basophils Relative: 0 %
Eosinophils Absolute: 0.1 10*3/uL (ref 0.0–0.5)
Eosinophils Relative: 1 %
HEMATOCRIT: 34.1 % — AB (ref 38.4–49.9)
HEMOGLOBIN: 11.4 g/dL — AB (ref 13.0–17.1)
LYMPHS ABS: 0.9 10*3/uL (ref 0.9–3.3)
LYMPHS PCT: 6 %
MCH: 29.6 pg (ref 27.2–33.4)
MCHC: 33.4 g/dL (ref 32.0–36.0)
MCV: 88.6 fL (ref 79.3–98.0)
Monocytes Absolute: 1.4 10*3/uL — ABNORMAL HIGH (ref 0.1–0.9)
Monocytes Relative: 10 %
NEUTROS ABS: 12 10*3/uL — AB (ref 1.5–6.5)
NEUTROS PCT: 83 %
Platelet Count: 143 10*3/uL (ref 140–400)
RBC: 3.85 MIL/uL — ABNORMAL LOW (ref 4.20–5.82)
RDW: 18.6 % — ABNORMAL HIGH (ref 11.0–14.6)
WBC: 14.4 10*3/uL — AB (ref 4.0–10.3)

## 2017-11-06 LAB — CMP (CANCER CENTER ONLY)
ALBUMIN: 3 g/dL — AB (ref 3.5–5.0)
ALT: 28 U/L (ref 0–55)
ANION GAP: 7 (ref 3–11)
AST: 32 U/L (ref 5–34)
Alkaline Phosphatase: 216 U/L — ABNORMAL HIGH (ref 40–150)
BUN: 13 mg/dL (ref 7–26)
CO2: 22 mmol/L (ref 22–29)
Calcium: 8.7 mg/dL (ref 8.4–10.4)
Chloride: 99 mmol/L (ref 98–109)
Creatinine: 0.6 mg/dL — ABNORMAL LOW (ref 0.70–1.30)
GFR, Est AFR Am: >60 mL/min (ref >60)
GLUCOSE: 112 mg/dL (ref 70–140)
POTASSIUM: 4.9 mmol/L (ref 3.5–5.1)
Sodium: 128 mmol/L — ABNORMAL LOW (ref 136–145)
TOTAL PROTEIN: 6.3 g/dL — AB (ref 6.4–8.3)
Total Bilirubin: 0.4 mg/dL (ref 0.2–1.2)

## 2017-11-06 LAB — TOTAL PROTEIN, URINE DIPSTICK: Protein, ur: <30 mg/dL — AB

## 2017-11-06 MED ORDER — AMOXICILLIN-POT CLAVULANATE 875-125 MG PO TABS: 1.0000 | ORAL_TABLET | Freq: Two times a day (BID) | ORAL | 0 refills | Status: DC

## 2017-11-06 MED ORDER — SODIUM CHLORIDE 0.9% FLUSH
10.0000 mL | Freq: Once | INTRAVENOUS | Status: AC
  Administered 2017-11-06: 10 mL
  Filled 2017-11-06: qty 10

## 2017-11-06 MED ORDER — MEGESTROL ACETATE 625 MG/5ML PO SUSP: 625.0000 mg | Freq: Every day | ORAL | 0 refills | Status: AC

## 2017-11-06 MED ORDER — HEPARIN SOD (PORK) LOCK FLUSH 100 UNIT/ML IV SOLN
500.0000 [IU] | Freq: Once | INTRAVENOUS | Status: AC
  Administered 2017-11-06: 500 [IU]
  Filled 2017-11-06: qty 5

## 2017-11-06 MED ORDER — SODIUM CHLORIDE 0.9 % IJ SOLN
10.0000 mL | Freq: Once | INTRAMUSCULAR | Status: AC
  Administered 2017-11-06: 10 mL
  Filled 2017-11-06: qty 10

## 2017-11-06 NOTE — Progress Notes (Signed)
[place]  [phone] Fax:([phone]) [phone]  Clinic Follow up Note   Patient Care Team: [name], MD as PCP - General (Internal Medicine) Truitt Merle, MD as Consulting Physician (Hematology) Kyung Rudd, MD as Consulting Physician (Radiation Oncology) [name], MD as Consulting Physician (Cardiothoracic Surgery) Karie Mainland, RD as Dietitian (Nutrition) Minus Breeding, MD as Consulting Physician (Cardiology)   Date of Service:  11/06/2017    CHIEF COMPLAINTS:  Follow up metastatic esophageal cancer    Oncology History   Presented with solid dysphagia at least daily with chest pain. Involuntary weight loss of 10-12 lb over past year  Malignant neoplasm of lower third of esophagus (HCC)   Staging form: Esophagus - Adenocarcinoma, AJCC 7th Edition   - Clinical stage from 12/15/2015: Stage IIIA (T3, N1, M0) - Signed by Truitt Merle, MD on 12/24/2015   - Pathologic stage from 04/26/2016: yT0, N0, cM0, GX - Signed by [name], MD on 04/27/2016      Malignant neoplasm of lower third of esophagus ([place])   11/23/2015 Procedure    EGD per [facility](Dr. Toledo):6cm mass in lower third of esophagus      11/24/2015 Initial Diagnosis    Esophageal cancer ([place])      11/24/2015 Pathology Results    Mucinous adenocarcinoma, moderately differentiated-Her2 analysis pending      12/05/2015 Imaging    PET scan showed hypermetabolic a mass in distal esophagus, hypermetabolic activity in the left anterior prostate gland, metabolic density in left adrenal nodule, CT attenuation suggest a benign adrenal adenoma. Left apical 2 mm lung nodule.      12/15/2015 Procedure    EUS showed a uT3N1 lesion from 27cm to 35cm from the incisors       12/26/2015 - 02/02/2016 Chemotherapy    Weekly carboplatin AUC 2 and Taxol 45 mg/m, with concurrent radiation      12/26/2015 - 02/02/2016 Radiation Therapy    Neoadjuvant chemoradiation to the  esophageal cancer      04/23/2016 Surgery    total esophagectomy with cervical esophagogastrostomy, pyloroplasty, feeding jejunostomy       04/23/2016 Pathology Results    Esophagectomy showed no residual tumor, 8 lymph nodes were all negative, incidental leiomyoma 0.2 cm      08/14/2016 PET scan    IMPRESSION: 1. Small hypermetabolic left adrenal mass, this has not changed in size compared to 12/01/15 but has a maximum standard uptake value of 6.2 (previously 4.2). Indeterminate density and MRI characteristics, not specific for adenoma. Early metastatic disease is not readily excluded although the lack of change of size is somewhat reassuring. Surveillance recommended. 2. No other hypermetabolic lesions are identified. 3. Moderate to large left pleural effusion with passive atelectasis. 4. Esophagectomy with gastric pull-through. 5. No hypermetabolic liver lesions are seen. 6. Enlarged prostate gland.      08/16/2016 - 08/23/2016 Hospital Admission    He was diagnosed with DIC, unclear etiology. He was given blood products. He underwent echocardiogram which was negative for endocarditis, CTA chest/abd which was negative for aneurysm or dissection as work up for DIC. He was on prednisone for some time, but stopped as it was not helping much for DIC.       08/28/2016 - 09/01/2016 Hospital Admission    He has had issues with recurrent L sided pleural effusions, DIC and melena. He was last admitted in 1/25, given supportive care with a thoracentesis and blood transfusion. Dr Burr Medico has been following DIC  and has requested a direct admit for melena, anemia and thrombocytopenia. GI called for EGD.        08/31/2016 Pathology Results    Bone Marrow Biopsy, right iliac - METASTATIC ADENOCARCINOMA. - SEE COMMENT. PERIPHERAL BLOOD: - NORMOCYTIC ANEMIA. - THROMBOCYTOPENIA.      08/31/2016 Progression    Bone marrow biopsy showed metastatic adenocarcinoma      09/04/2016 - 06/26/2017 Chemotherapy      FOLFOX every 2 weeks; changed to maintenance 5-FU every 2 weeks starting 02/06/17. Stopped after 06/26/17 due to significant increase in CEA.       09/19/2016 Procedure    Therapeutic thoracentesis of a left pleural effusion performed on 09/19/16 yielded 1.5 liters of blood-tinged fluid. Reactive appearing mesothelial cells were present.      11/12/2016 PET scan     IMPRESSION: 1. Heterogeneous bony uptake with several mildly avid foci primarily in the lower thoracic spine, lumbar spine, and pelvic bones consistent with known bony metastatic disease. 2. There is new uptake in the right adrenal gland not seen previously with no CT correlate. Recommend attention on follow-up. 3. A rounded focus of uptake in the anterior peripheral left prostate is larger in the interval. The findings are nonspecific and could be infectious, inflammatory, or neoplastic. Focal prostatitis or prostate cancer are most likely. 4. The uptake in the left adrenal gland has decreased in the interval. 5. Mild uptake in the bilateral hila may simply represent reactive nodes. Recommend attention on follow-up. 6. The left-sided pleural effusion is a little larger in the interval with loculated components.      02/20/2017 PET scan    PET 02/20/17 IMPRESSION: 1. No activity suspicious for metastatic adenocarcinoma status post esophagectomy and gastric pull-through. 2. Stable mild prominence of the left adrenal gland and associated hypermetabolic activity from several prior studies. The lack of change implies a benign etiology. No abnormal activity within the right adrenal gland. 3. Persistent focal activity peripherally in the left lobe of the prostate, unchanged from the most recent study. Appearance is nonspecific, and could reflect prostate cancer or a benign nodule. Focal prostatitis less likely given persistence. 4. Stable loculated left pleural effusion and left basilar atelectasis. 5. These results were  called by telephone at the time of interpretation on 02/20/2017 at 1:47 pm to Dr. Truitt Merle , who verbally acknowledged these results.      05/23/2017 PET scan    PET  IMPRESSION: 1. No significant change compared to 02/19/2017. 2. No specific findings to suggest metastatic disease in this patient who is status post esophagectomy and gastric pull-through. 3. Left adrenal hypermetabolism and nodularity are unchanged over multiple prior exams, again favoring a benign etiology. 4. Left anterior prostatic hypermetabolism is similar and should be correlated with PSA and possibly prostate MRI, if not performed. 5. Left larger than right pleural effusions, with left-sided mild loculation. 6. Left nephrolithiasis. 7. Coronary artery atherosclerosis. Aortic Atherosclerosis      07/19/2017 -  Chemotherapy    Started second line therapy with Taxol on day 1,8 and 15 and ramucirumab on day 1 and 15, every 28 days starting 07/19/17      11/01/2017 Imaging    CTA CHEST PE / CT ABD & PEL IMPRESSION: 1. No CT findings for pulmonary embolism. 2. No mediastinal or hilar mass or adenopathy. 3. Bilateral pleural effusions with overlying atelectasis. 4. No worrisome pulmonary lesions. 5. Surgical changes from gastric pull-through procedure. No findings suspicious for recurrent tumor or adenopathy. 6. Stable  left adrenal gland lesion. 7. Diffuse appearing mixed lytic and sclerotic process involving the bony structures consistent with metastatic disease.        HISTORY OF PRESENTING ILLNESS (12/14/2015):  Fred [name] 77 y.o. male is here because of His newly diagnosed esophageal cancer. He is accompanied by his wife and daughter to my clinic today.  He has had dysphagia for 2 months, mainly with solid food, no dysphagia with soft food or liquid. He also has mild pain in mid chest when he swallows. No nausea, abdominal pain, or other discomfort. He has lost about 15 lbs in the last year, no other  symptoms. He was seen by his primary care physician at [facility], and referred to gastroenterologist Dr. Alice Reichert. He underwent EGD on 11/23/2015, which showed a 6 cm mass in the lower third of esophagus. Biopsy showed adenocarcinoma, moderately differentiated. He was referred to [place] to consider neoadjuvant therapy. He is scheduled to see a Psychologist, sport and exercise at [facility] later this week.  He has had some urinary frequency, underwent TURP on 11/02/2015.   CURRENT THERAPY:  second line therapy with Taxol on day 1,8 and 15 and ramucirumab on day 1 and 15, every 28 days starting 07/19/17, due to significant fatigue I reduced Taxol to every 2 weeks starting 09/12/17.    INTERIM HISTORY:  Fred [name] returns for follow up and treatment for his metastatic esophageal cancer. He presents to the clinic today accompanied by his wife. He reports he is still SOB but it is slightly improved. He is only able to walk a little bit further. He states he does not have any appetite. He is drinking 1 Ensure a day. He states he had a fever of 100.9 yesterday and he took Tylenol and it came down. He last took Tylenol this morning. He does have a mild cough intermittently.   On review of systems, pt denies pain, leg swelling or any other complaints at this time. Pertinent positives are listed and detailed within the above HPI.   MEDICAL HISTORY:  Past Medical History:  Diagnosis Date  . Esophageal cancer ([place]) 11/23/15   lower 3rd esohagus   . GERD (gastroesophageal reflux disease)   . Hyperlipidemia   . Hypertension     SURGICAL HISTORY: Past Surgical History:  Procedure Laterality Date  . CHEST TUBE INSERTION Left 04/23/2016   Procedure: CHEST TUBE INSERTION;  Surgeon: [name], MD;  Location: [facility];  Service: Thoracic;  Laterality: Left;  . COLONOSCOPY N/A 08/30/2016   Procedure: COLONOSCOPY;  Surgeon: Milus Banister, MD;  Location: [facility];  Service: Endoscopy;  Laterality: N/A;  . COMPLETE ESOPHAGECTOMY  N/A 04/23/2016   Procedure: TRANSHIATIAL TOTAL ESOPHAGECTOMY COMPLETE; CERVICAL ESOPHAGOGASTROSTOMY AND PYLOROPLASTY;  Surgeon: [name], MD;  Location: [facility];  Service: Thoracic;  Laterality: N/A;  . cystoscope  4/12/1   prostate  . ERCP    . ESOPHAGOGASTRODUODENOSCOPY (EGD) WITH PROPOFOL N/A 08/29/2016   Procedure: ESOPHAGOGASTRODUODENOSCOPY (EGD) WITH PROPOFOL;  Surgeon: Milus Banister, MD;  Location: [facility];  Service: Endoscopy;  Laterality: N/A;  . INGUINAL HERNIA REPAIR    . IR GENERIC HISTORICAL  08/31/2016   IR FLUORO GUIDE PORT INSERTION RIGHT 08/31/2016 Corrie Mckusick, DO [facility]  . IR GENERIC HISTORICAL  08/31/2016   IR US GUIDE VASC ACCESS RIGHT 08/31/2016 Corrie Mckusick, DO [facility]  . IR REPLC DUODEN/JEJUNO TUBE PERCUT W/FLUORO  09/06/2017  . JEJUNOSTOMY N/A 04/23/2016   Procedure: FEEDING JEJUNOSTOMY;  Surgeon:  [name], MD;  Location: [place];  Service: Thoracic;  Laterality: N/A;  . LEG SURGERY Left    hole  . PROSTATE BIOPSY  10/05/15  . right shoulder surgery    . VIDEO BRONCHOSCOPY N/A 04/23/2016   Procedure: VIDEO BRONCHOSCOPY;  Surgeon: [name], MD;  Location: [facility] OR;  Service: Thoracic;  Laterality: N/A;    SOCIAL HISTORY: Social History   Socioeconomic History  . Marital status: Married    Spouse name: Not on file  . Number of children: 1  . Years of education: Not on file  . Highest education level: Not on file  Occupational History  . Occupation: [employer] operational officer  . Financial resource strain: Not on file  . Food insecurity:    Worry: Not on file    Inability: Not on file  . Transportation needs:    Medical: Not on file    Non-medical: Not on file  Tobacco Use  . Smoking status: Former Smoker    Packs/day: 1.00    Years: 10.00    Pack years: 10.00    Last attempt to quit: 07/24/1967    Years since quitting: 50.3  . Smokeless tobacco: Never Used  Substance and Sexual Activity  . Alcohol use: No  . Drug use: No   . Sexual activity: Not Currently  Lifestyle  . Physical activity:    Days per week: Not on file    Minutes per session: Not on file  . Stress: Not on file  Relationships  . Social connections:    Talks on phone: Not on file    Gets together: Not on file    Attends religious service: Not on file    Active member of club or organization: Not on file    Attends meetings of clubs or organizations: Not on file    Relationship status: Not on file  . Intimate partner violence:    Fear of current or ex partner: Not on file    Emotionally abused: Not on file    Physically abused: Not on file    Forced sexual activity: Not on file  Other Topics Concern  . Not on file  Social History Narrative  . Not on file    FAMILY HISTORY: Family History  Problem Relation Age of Onset  . Cancer Maternal Grandfather 60       gastric cancer   . Alzheimer's disease Mother   . Diabetes Mother   . Stroke Father 71  . Multiple sclerosis Sister     ALLERGIES:  is allergic to no known allergies.  MEDICATIONS:  Current Outpatient Medications  Medication Sig Dispense Refill  . acetaminophen (TYLENOL) 325 MG tablet Take 650 mg by mouth every 6 (six) hours as needed.    Marland Kitchen atenolol (TENORMIN) 25 MG tablet Take 1 tab in the morning and half tab in the evening (Patient taking differently: Take 12.5-25 mg by mouth 2 (two) times daily. Take '25mg'$  in the morning and 12.'5mg'$  in the evening) 60 tablet 1  . lidocaine-prilocaine (EMLA) cream Apply 1 application topically as needed. Apply 1-2 tsp over port site 1 hour prior to chemotherapy 30 g 1  . mirtazapine (REMERON) 15 MG tablet Take 1 tablet (15 mg total) by mouth at bedtime. 30 tablet 2  . Nutritional Supplements (NUTREN 1.5) LIQD 250 mLs by Jejunal Tube route at bedtime. 3 CANS VIA FEEDING TUBE    . ondansetron (ZOFRAN ODT) 8 MG disintegrating tablet Take 1  tablet (8 mg total) by mouth every 8 (eight) hours as needed for nausea or vomiting. 30 tablet 2  .  prochlorperazine (COMPAZINE) 10 MG tablet Take 1 tablet (10 mg total) by mouth every 6 (six) hours as needed for nausea or vomiting. 30 tablet 3  . amoxicillin-clavulanate (AUGMENTIN) 875-125 MG tablet Take 1 tablet by mouth 2 (two) times daily. 14 tablet 0  . dexamethasone (DECADRON) 4 MG tablet Take 1 tablet (4 mg total) by mouth daily. (Patient not taking: Reported on 11/06/2017) 5 tablet 0  . folic acid (FOLVITE) 1 MG tablet Take 1 tablet (1 mg total) by mouth daily. (Patient not taking: Reported on 11/06/2017) 30 tablet 0  . megestrol (MEGACE ES) 625 MG/5ML suspension Take 5 mLs (625 mg total) by mouth daily. 150 mL 0  . oxycodone (OXY-IR) 5 MG capsule Take 5 mg by mouth every 6 (six) hours as needed for pain.     . vitamin B-12 1000 MCG tablet Take 1 tablet (1,000 mcg total) by mouth daily. (Patient not taking: Reported on 11/06/2017) 30 tablet 0   No current facility-administered medications for this visit.     REVIEW OF SYSTEMS:   Constitutional: Denies chills or abnormal night sweats   (+) significant fatigue and low energy (+) low grade fever yesterday Eyes: Denies blurriness of vision, double vision or watery eyes Ears, nose, mouth, throat, and face: Denies mucositis or sore throat  Respiratory: Denies wheezes (+) SOB upon walking (+) cough Cardiovascular: Denies palpitation, chest discomfort or lower extremity swelling (+) J tube  Gastrointestinal:  Denies heartburn or change in bowel habits Skin: Denies abnormal skin rashes (+) dry skin  Lymphatics: Denies new lymphadenopathy or easy bruising Neurological:Denies numbness, tingling or new weaknesses (+) Neuropathy, improved Behavioral/Psych: Mood is stable, no new changes  Musculoskeletal: (+) less ambulating All other systems were reviewed with the patient and are negative.  PHYSICAL EXAMINATION:  ECOG PERFORMANCE STATUS: 2-3 Vitals:   11/06/17 0951  BP: (!) 107/57  Pulse: 95  Resp: 18  Temp: 98.7 F (37.1 C)  TempSrc:  Oral  SpO2: 100%  Weight: 131 lb 1.6 oz (59.5 kg)  Height: '5\' 5"'$  (1.651 m)    GENERAL:alert, no distress and comfortable, in wheelchair SKIN: Appears to be pale, skin texture, turgor are normal, no rashes or significant lesions EYES: normal, conjunctiva are pink and non-injected, sclera clear OROPHARYNX:no exudate, no erythema and lips, buccal mucosa, and tongue normal  NECK: supple, thyroid normal size, non-tender, without nodularity LYMPH:  no palpable lymphadenopathy in the cervical, axillary or inguinal LUNGS: clear to auscultation and percussion with normal breathing effort (+) pleural effusion L > R HEART: regular rate & rhythm and no murmurs and no lower extremity edema ABDOMEN:abdomen soft, non-tender and normal bowel sounds, J tube in place. No skin erythema or pus. 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  LABORATORY DATA:  I have reviewed the data as listed CBC Latest Ref Rng & Units 11/06/2017 10/23/2017 10/08/2017  WBC 4.0 - 10.3 K/uL 14.4(H) 4.7 4.6  Hemoglobin 13.0 - 17.1 g/dL - - -  Hematocrit 38.4 - 49.9 % 34.1(L) 31.3(L) 31.9(L)  Platelets 140 - 400 K/uL 143 264 239   CMP Latest Ref Rng & Units 11/06/2017 10/23/2017 10/08/2017  Glucose 70 - 140 mg/dL 112 114 108  BUN 7 - 26 mg/dL '13 11 11  '$ Creatinine 0.70 - 1.30 mg/dL 0.60(L) 0.59(L) 0.62(L)  Sodium 136 - 145 mmol/L 128(L) 130(L) 131(L)  Potassium 3.5 - 5.1 mmol/L 4.9 4.8 4.5  Chloride 98 - 109 mmol/L 99 101 101  CO2 22 - 29 mmol/L 22 23 21(L)  Calcium 8.4 - 10.4 mg/dL 8.7 8.8 8.9  Total Protein 6.4 - 8.3 g/dL 6.3(L) 6.2(L) 6.3(L)  Total Bilirubin 0.2 - 1.2 mg/dL 0.4 0.4 0.4  Alkaline Phos 40 - 150 U/L 216(H) 186(H) 195(H)  AST 5 - 34 U/L 32 22 23  ALT 0 - 55 U/L '28 17 16   '$ CEA  02/20/2017: 2.89 05/15/2017: 57.0 06/12/2017: 255 06/26/2017:  632 07/19/2017: 3138 08/02/2017: 5016 08/21/17: 1480.02 09/25/17: 350.07 10/23/17: 160.61   Pathology report [facility] 09/29/2016   PD-L1 Testing 09/29/2016   Diagnosis 09/27/2016 Consult- Comprehensive, [id number] - 1A Mass, Lower third of Esophagus, Mucosal Biopsy - ADENOCARCINOMA WITH EXTRACELLULAR MUCIN. - SEE COMMENT. Microscopic Comment Provided Her2 IHC with the appropriate controls is negative. Per report Her2 FISH is also negative. There is limited tissue remaining, but [facility] and PDL-1 testing will be attempted as requested.  Diagnosis 09/19/2016 PLEURAL FLUID, LEFT (SPECIMEN 1 OF 1 COLLECTED 09/19/16): REACTIVE APPEARING MESOTHELIAL CELLS PRESENT.  Diagnosis 08/31/2016 Bone Marrow Biopsy, right iliac - METASTATIC ADENOCARCINOMA. - SEE COMMENT. PERIPHERAL BLOOD: - NORMOCYTIC ANEMIA. - THROMBOCYTOPENIA.  Diagnosis 08/02/16 PLEURAL FLUID, LEFT (SPECIMEN 1 OF 1 COLLECTED 08/02/16): REACTIVE MESOTHELIAL CELLS PRESENT. LYMPHOCYTES PRESENT.  Diagnosis 11/23/2015 Mass, lower third of the esophagus, mucosal biopsy Mucinous adenocarcinoma, moderately differentiated.  Diagnosis 04/23/2016 1. Lymph node, biopsy, Cervical - ONE OF ONE LYMPH NODES NEGATIVE FOR CARCINOMA (0/1). 2. Lymph node, biopsy, Periesophageal - BLOOD WITH SCANT BENIGN SOFT TISSUE AND SKELETAL MUSCLE. - NO MALIGNANCY IDENTIFIED. 3. Esophagogastrectomy - ULCERATION WITH INFLAMMATION AND REACTIVE CHANGES. - FOCAL INTESTINAL METAPLASIA. - CHANGES CONSISTENT WITH TREATMENT EFFECT. - SEVEN OF SEVEN LYMPH NODES NEGATIVE FOR CARCINOMA (0/7). - INCIDENTAL LEIOMYOMA, 0.2 CM. - MARGINS ARE NEGATIVE FOR DYSPLASIA OR MALIGNANCY. - SEE ONCOLOGY TABLE. Microscopic Comment 3. ESOPHAGUS: Specimen: Esophagus and proximal stomach, cervical lymph node. Procedure: Esophagogastrectomy and cervical lymph node biopsy. Tumor Site: Presumed GE junction, see comment. Relationship of Tumor to esophagogastric  junction: Can not be assessed. Distance of tumor center from esophagogastric junction: Can not be assessed. Tumor Size Greatest dimension: No residual tumor present. Histologic Type: Per medical record adenocarcinoma (no biopsy for review). Histologic Grade: N/A. Microscopic Tumor Extension: N/A. Margins: Negative. Treatment Effect: Present (TRS 0). Lymph-Vascular Invasion: Not identified. Perineural Invasion: Not identified. Lymph nodes: number examined 8; number positive: 0 TNM: ypT0, ypN0 see comment. 1 of 3 FINAL for Fred [name], Fred [name] ([id number]) Microscopic Comment(continued) Ancillary studies: None. Comments: The entire GE junction is submitted for evaluation. There is ulceration with associated inflammation. There is acellular mucin/myxoid changes which extend through the muscularis propria, but no viable/residual tumor is identified and thus the stage is ypT0. There is focal background intestinal metaplasia, which may have been pre-existing (Barrett's esophagus) or related to therapy.   RADIOGRAPHIC STUDIES: I have personally reviewed the radiological images as listed and agreed with the findings in the report. Ct Angio Chest Pe W Or Wo Contrast  Result Date: 11/01/2017 CLINICAL DATA:  Shortness of breath and dyspnea on exertion. History of esophageal cancer since 2017. EXAM: CT ANGIOGRAPHY CHEST CT ABDOMEN AND PELVIS WITH CONTRAST TECHNIQUE: Multidetector CT imaging of the chest was performed using the standard protocol  during bolus administration of intravenous contrast. Multiplanar CT image reconstructions and MIPs were obtained to evaluate the vascular anatomy. Multidetector CT imaging of the abdomen and pelvis was performed using the standard protocol during bolus administration of intravenous contrast. CONTRAST:  171m ISOVUE-370 IOPAMIDOL (ISOVUE-370) INJECTION 76% COMPARISON:  Multiple prior PET CTs from 2018. FINDINGS: CTA CHEST FINDINGS Cardiovascular: The heart is normal  in size. No pericardial effusion. There is mild tortuosity, ectasia and calcification of the thoracic aorta but no focal aneurysm or dissection. Scattered coronary artery calcifications are noted. Right IJ Port-A-Cath is in good position with the tip just into the right atrium. The pulmonary arterial tree is well opacified. No filling defects to suggest pulmonary embolism. Mediastinum/Nodes: Small scattered mediastinal and hilar lymph nodes but no mass or overt adenopathy. Surgical changes from a gastric pull-through procedure. No complicating features are identified. No adenopathy or recurrent tumor. Lungs/Pleura: Moderate-sized bilateral pleural effusions, left greater than right. The left effusion is at least partially loculated. Overlying atelectasis is noted, particularly on the left. I do not see any worrisome pulmonary lesions or metastatic pulmonary nodules. Musculoskeletal: No chest wall mass, supraclavicular or axillary lymphadenopathy. Mixed lytic and sclerotic process throughout the bony structures suggesting metastatic disease. This appears stable. Review of the MIP images confirms the above findings. CT ABDOMEN and PELVIS FINDINGS Hepatobiliary: Tiny scattered low-attenuation hepatic lesions are likely benign cysts. No worrisome hepatic lesions or intrahepatic biliary dilatation. The portal and hepatic veins are patent. The gallbladder is grossly normal. No common bile duct dilatation. Pancreas: Moderate pancreatic atrophy but no mass or inflammation. No ductal dilatation. Spleen: Spleen is upper limits of normal in size and stable. No splenic lesions. Adrenals/Urinary Tract: Stable left adrenal gland nodule. The right adrenal gland is normal and stable. Stable renal cysts. No worrisome renal lesions. No hydronephrosis. The bladder is unremarkable. Stomach/Bowel: Gastric pull-through changes. The duodenum is unremarkable. There is a feeding jejunostomy tube in place. No complicating features. No small  bowel obstruction. The colon is unremarkable. No lesions or obstructive findings. The terminal ileum is normal. Vascular/Lymphatic: The aorta is normal in caliber. The branch vessels are patent. The major venous structures are patent. Small scattered mesenteric and retroperitoneal lymph nodes but no mass or adenopathy. Reproductive: Enlarged prostate gland with median lobe hypertrophy impressing on the base of the bladder. The seminal vesicles are unremarkable. Other: Small amount of free pelvic fluid. No pelvic adenopathy. No inguinal adenopathy. The bony structures are stable. Musculoskeletal: Diffuse mixed lytic and sclerotic process involving the bony structures consistent with metastatic disease. Review of the MIP images confirms the above findings. IMPRESSION: 1. No CT findings for pulmonary embolism. 2. No mediastinal or hilar mass or adenopathy. 3. Bilateral pleural effusions with overlying atelectasis. 4. No worrisome pulmonary lesions. 5. Surgical changes from gastric pull-through procedure. No findings suspicious for recurrent tumor or adenopathy. 6. Stable left adrenal gland lesion. 7. Diffuse appearing mixed lytic and sclerotic process involving the bony structures consistent with metastatic disease. Electronically Signed   By: PMarijo SanesM.D.   On: 11/01/2017 17:05   Ct Abdomen Pelvis W Contrast  Result Date: 11/01/2017 CLINICAL DATA:  Shortness of breath and dyspnea on exertion. History of esophageal cancer since 2017. EXAM: CT ANGIOGRAPHY CHEST CT ABDOMEN AND PELVIS WITH CONTRAST TECHNIQUE: Multidetector CT imaging of the chest was performed using the standard protocol during bolus administration of intravenous contrast. Multiplanar CT image reconstructions and MIPs were obtained to evaluate the vascular anatomy. Multidetector CT imaging of the  abdomen and pelvis was performed using the standard protocol during bolus administration of intravenous contrast. CONTRAST:  130m ISOVUE-370 IOPAMIDOL  (ISOVUE-370) INJECTION 76% COMPARISON:  Multiple prior PET CTs from 2018. FINDINGS: CTA CHEST FINDINGS Cardiovascular: The heart is normal in size. No pericardial effusion. There is mild tortuosity, ectasia and calcification of the thoracic aorta but no focal aneurysm or dissection. Scattered coronary artery calcifications are noted. Right IJ Port-A-Cath is in good position with the tip just into the right atrium. The pulmonary arterial tree is well opacified. No filling defects to suggest pulmonary embolism. Mediastinum/Nodes: Small scattered mediastinal and hilar lymph nodes but no mass or overt adenopathy. Surgical changes from a gastric pull-through procedure. No complicating features are identified. No adenopathy or recurrent tumor. Lungs/Pleura: Moderate-sized bilateral pleural effusions, left greater than right. The left effusion is at least partially loculated. Overlying atelectasis is noted, particularly on the left. I do not see any worrisome pulmonary lesions or metastatic pulmonary nodules. Musculoskeletal: No chest wall mass, supraclavicular or axillary lymphadenopathy. Mixed lytic and sclerotic process throughout the bony structures suggesting metastatic disease. This appears stable. Review of the MIP images confirms the above findings. CT ABDOMEN and PELVIS FINDINGS Hepatobiliary: Tiny scattered low-attenuation hepatic lesions are likely benign cysts. No worrisome hepatic lesions or intrahepatic biliary dilatation. The portal and hepatic veins are patent. The gallbladder is grossly normal. No common bile duct dilatation. Pancreas: Moderate pancreatic atrophy but no mass or inflammation. No ductal dilatation. Spleen: Spleen is upper limits of normal in size and stable. No splenic lesions. Adrenals/Urinary Tract: Stable left adrenal gland nodule. The right adrenal gland is normal and stable. Stable renal cysts. No worrisome renal lesions. No hydronephrosis. The bladder is unremarkable. Stomach/Bowel:  Gastric pull-through changes. The duodenum is unremarkable. There is a feeding jejunostomy tube in place. No complicating features. No small bowel obstruction. The colon is unremarkable. No lesions or obstructive findings. The terminal ileum is normal. Vascular/Lymphatic: The aorta is normal in caliber. The branch vessels are patent. The major venous structures are patent. Small scattered mesenteric and retroperitoneal lymph nodes but no mass or adenopathy. Reproductive: Enlarged prostate gland with median lobe hypertrophy impressing on the base of the bladder. The seminal vesicles are unremarkable. Other: Small amount of free pelvic fluid. No pelvic adenopathy. No inguinal adenopathy. The bony structures are stable. Musculoskeletal: Diffuse mixed lytic and sclerotic process involving the bony structures consistent with metastatic disease. Review of the MIP images confirms the above findings. IMPRESSION: 1. No CT findings for pulmonary embolism. 2. No mediastinal or hilar mass or adenopathy. 3. Bilateral pleural effusions with overlying atelectasis. 4. No worrisome pulmonary lesions. 5. Surgical changes from gastric pull-through procedure. No findings suspicious for recurrent tumor or adenopathy. 6. Stable left adrenal gland lesion. 7. Diffuse appearing mixed lytic and sclerotic process involving the bony structures consistent with metastatic disease. Electronically Signed   By: PMarijo SanesM.D.   On: 11/01/2017 17:05   PET 08/14/2016 IMPRESSION: 1. Small hypermetabolic left adrenal mass, this has not changed in size compared to 12/01/15 but has a maximum standard uptake value of 6.2 (previously 4.2). Indeterminate density and MRI characteristics, not specific for adenoma. Early metastatic disease is not readily excluded although the lack of change of size is somewhat reassuring. Surveillance recommended. 2. No other hypermetabolic lesions are identified. 3. Moderate to large left pleural effusion with  passive atelectasis. 4. Esophagectomy with gastric pull-through. 5. No hypermetabolic liver lesions are seen. 6. Enlarged prostate gland.  CT angio chest/abdomen/pelvis for  dissection w/wo contrast 08/22/2016 IMPRESSION: No evidence of aortic aneurysm or dissection. No evidence of pulmonary embolus. Heart is borderline in size. Stable small left adrenal nodule which was hypermetabolic on prior PET CT. Stable large left pleural effusion with compressive atelectasis in the left lower lobe. Stable appearance of the gastric pull-through post esophagectomy.  US Thoracentesis asp pleural space 09/19/2016 IMPRESSION: Successful ultrasound guided diagnostic and therapeutic left thoracentesis yielding 1.5 liters of pleural fluid.  PET 11/12/2016 IMPRESSION: 1. Heterogeneous bony uptake with several mildly avid foci primarily in the lower thoracic spine, lumbar spine, and pelvic bones consistent with known bony metastatic disease. 2. There is new uptake in the right adrenal gland not seen previously with no CT correlate. Recommend attention on follow-up. 3. A rounded focus of uptake in the anterior peripheral left prostate is larger in the interval. The findings are nonspecific and could be infectious, inflammatory, or neoplastic. Focal prostatitis or prostate cancer are most likely. 4. The uptake in the left adrenal gland has decreased in the interval. 5. Mild uptake in the bilateral hila may simply represent reactive nodes. Recommend attention on follow-up. 6. The left-sided pleural effusion is a little larger in the interval with loculated components.    ASSESSMENT & PLAN:  77 y.o. male presented with dysphagia with solid food  1. 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 -The initial  PET scan showed no definitive distant metastasis. 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 This will be followed in the future scan. -By EUS, he had T3 N1 stage III disease. -He completed neoadjuvant radiation with weekly Carbo and Taxol. -I previously reviewed his surgical pathology findings, he has had complete pathologic response to neoadjuvant chemotherapy and radiation, which predicts good prognosis -Unfortunately he developed diffuse bone metastasis in January 2018, confirmed by bone marrow biopsy -He has started first-line chemotherapy FOLFOX, tolerating well, and his anemia and thrombocytopenia has previously improved/resolved since chemotherapy -The goal of therapy is palliative -his tumor was negative for HER2 and PD-L1 expression and MSI-stable,  he is not a candidate for immunotherapy or anti-her2 therapy  -I previously reviewed his [facility] genomic testing results, unfortunately there is no good targeted therapy available. 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 I also discussed the option of maintenance Xeloda, he opted 5-fu  -We previously reviewed his 05/23/17 PET. Unfortunately his bone metastasis not measurable on the PET scan, and scan showed no other definitive evidence of metastatic disease.  -He unfortunately developed disease progression on maintenance therapy. His CEA on 06/26/17 increased to 632. Due to his neuropathy, it would be difficult to restart Oxaliplatin.  -I changed his treatment to second line therapy with Taxol on day 1, 8 and 15 and ramucirumab on day 1 and 15, every 28 days starting 07/19/17 He is tolerating treatment  moderately well progressive fatigue and weakness.  -He still has persistent significant fatigue. I previously dicussed the option of reducing his chemo to every other week, changing to irinotecan or the option of stopping chemotherapy all  together if chemo is not tolerable.  -He opted to reduce Taxol to every 2 weeks starting 09/12/17.  -I encouraged him to remain active and walk as much as he can.  -He completed 3 cycles taxol/cyramza on 10/09/17. He has significant fatigue and weakness with increased dyspnea on standing. His limited performance status is negatively affecting his quality of life, but he wants to continue treatment in general. Lacie presribed short course of steroids to see if his fatigue improves, will take decadron 1 tab daily x5 days. -Due to his persisting SOB, we ordered a CT to rule out PE and echo.  We reviewed the scan results.  The CT scan was negative for PE or visible metastasis, he has stable bone metastasis.  Echo showed a normal EF with mild hypertrophy. He still has moderate left pleural effusion. I suspect this could be causing some of his SOB. I will send him to IR next week to see if they can repeat throacentesis.  -Since he does feel better after we held one drug last time, I do recommend that we hold treatment for ~ 4 weeks to see if he can recover more. His CEA has greatly decreased and his recent CT Scan was stable. It is okay to stop treatment and take a break for a month.  -F/u in 2 weeks to see how he is doing, will consider discussing other treatment options at that time -He declined IVF today, I encouraged him to drink more fluids or increase his feeding at home.    2. 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 thrombocytopenia has resolved. -He has had 2 blood transfusions on 07/24/17 and 07/30/17 while on chemo. -He has developed a DIC again when he had cancer  progression -Since he started chemotherapy, his DIC has improved -His Hgb improved to 11.4 today (11/06/2017), no blood tranfusion today   3. 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  -He has had 3 repeated thoracentesis, all cytology were negative, -Therapeutic thoracentesis performed on 09/19/16 yielded 1.5 liters of blood-tinged fluid. Reactive appearing mesothelial cells were present. -Chest Xray from 08/29/17 showed stable bilateral pleural effusion, L>R,  but not a significant amount to need a thoracentesis.  -Pt's effusion is also contributing to his SOB -Continue to follow-up with Dr. Servando Snare -I will send him to IR next week for thoracentesis   4. HTN -History of hypertension, off meds due to borderline hypotension, except low-dose atenolol -Blood pressure normal lately.  Continue monitoring.  5. Malnutrition  -Doing better, he is tolerating J-tube feeding very well, he eats normally. Encouraged the patient to eat small frequent meals. -Feeding tube site is clean and no bleeding lately  -He gets tube feeds at night about 750cc/day  -Continue to follow up with nutritionist at the [facility] -Weight is stable lately with new J tube placement   6. Anorexia, depression and insomnia   -continue mirtazapine.  -He hasn't talked to the nutritionist in a while, he will follow up -Prescribed megace today    7. Fatigue, Dyspnea on Exertion  -He would like something to help his energy. -We previously discussed steroids to help for this, but I told them that this is not a long term solution -He would not like steroids at this time.  -We previously discussed fatigue being related to chemo or anemia.  -I previously encouraged him to eat well with protein and be more active -Will reduce Taxol  and continue to monitor closely.   -He is still very fatigued, barely able to walk for more than 5-10 minutes. This has not improved since we changed his chemo to every 2 weeks.   He does have dyspnea on mild exertion. -I ordered a CT Angio Chest to rule out PE on 11/01/17 that was negative. His echo was also LV EF: 60% - 65%  8. Goal of care discussion  -We previously discussed the incurable nature of his cancer, and the overall poor prognosis, especially if he does not have good response to chemotherapy or progress on chemo -The patient understands the goal of care is palliative. -I recommend DNR/DNI, he will think about it   9. 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  -His numbness had increased further up his hands.  -residual neuropathy still present, 5FU stopped as well.  -Will continue to monitor with Taxol treatment   12. Possible bronchitis  -He states he had a fever of 100.9 yesterday and took 2 doses of Tylenol. Last dose this morning  -He does have a cough and his WBC is elevated at 14.4 today. I suspect he could have a small infection like bronchitis  -I prescribed Augmentin today   Plan -Hold treatment for the next 2 weeks and return for f/u  -he declined IVF today -Prescribed megace for anorexia and Augmentin for possible infection  -I will send him to IR next week for thoracentesis     All questions were answered. The patient knows to call the clinic with any problems, questions or concerns.  I spent 25 minutes counseling the patient face to face. The total time spent in the appointment was 30 minutes and more than 50% was on counseling.  This document serves as a record of services personally performed by Truitt Merle, MD. It was created on her behalf by Theresia Bough, a trained medical scribe. The  creation of this record is based on the scribe's personal observations and the provider's statements to them.   I have reviewed the above documentation for accuracy and completeness, and I agree with the above.    Truitt Merle, MD 11/06/2017 2:25 PM

## 2017-11-06 NOTE — Progress Notes (Deleted)
[place]  [phone] [phone]  Clinic Follow up Note   Patient Care Team: [name], MD as PCP - General (Internal Medicine) Truitt Merle, MD as Consulting Physician (Hematology) Kyung Rudd, MD as Consulting Physician (Radiation Oncology) [name], MD as Consulting Physician (Cardiothoracic Surgery) Karie Mainland, RD as Dietitian (Nutrition) Minus Breeding, MD as Consulting Physician (Cardiology) 11/06/2017  SUMMARY OF ONCOLOGIC HISTORY: Oncology History   Presented with solid dysphagia at least daily with chest pain. Involuntary weight loss of 10-12 lb over past year  Malignant neoplasm of lower third of esophagus (HCC)   Staging form: Esophagus - Adenocarcinoma, AJCC 7th Edition   - Clinical stage from 12/15/2015: Stage IIIA (T3, N1, M0) - Signed by Truitt Merle, MD on 12/24/2015   - Pathologic stage from 04/26/2016: yT0, N0, cM0, GX - Signed by [name], MD on 04/27/2016      Malignant neoplasm of lower third of esophagus ([place])   11/23/2015 Procedure    EGD per [facility](Dr. Toledo):6cm mass in lower third of esophagus      11/24/2015 Initial Diagnosis    Esophageal cancer ([place])      11/24/2015 Pathology Results    Mucinous adenocarcinoma, moderately differentiated-Her2 analysis pending      12/05/2015 Imaging    PET scan showed hypermetabolic a mass in distal esophagus, hypermetabolic activity in the left anterior prostate gland, metabolic density in left adrenal nodule, CT attenuation suggest a benign adrenal adenoma. Left apical 2 mm lung nodule.      12/15/2015 Procedure    EUS showed a uT3N1 lesion from 27cm to 35cm from the incisors       12/26/2015 - 02/02/2016 Chemotherapy    Weekly carboplatin AUC 2 and Taxol 45 mg/m, with concurrent radiation      12/26/2015 - 02/02/2016 Radiation Therapy    Neoadjuvant chemoradiation to the esophageal cancer      04/23/2016 Surgery    total  esophagectomy with cervical esophagogastrostomy, pyloroplasty, feeding jejunostomy       04/23/2016 Pathology Results    Esophagectomy showed no residual tumor, 8 lymph nodes were all negative, incidental leiomyoma 0.2 cm      08/14/2016 PET scan    IMPRESSION: 1. Small hypermetabolic left adrenal mass, this has not changed in size compared to 12/01/15 but has a maximum standard uptake value of 6.2 (previously 4.2). Indeterminate density and MRI characteristics, not specific for adenoma. Early metastatic disease is not readily excluded although the lack of change of size is somewhat reassuring. Surveillance recommended. 2. No other hypermetabolic lesions are identified. 3. Moderate to large left pleural effusion with passive atelectasis. 4. Esophagectomy with gastric pull-through. 5. No hypermetabolic liver lesions are seen. 6. Enlarged prostate gland.      08/16/2016 - 08/23/2016 Hospital Admission    He was diagnosed with DIC, unclear etiology. He was given blood products. He underwent echocardiogram which was negative for endocarditis, CTA chest/abd which was negative for aneurysm or dissection as work up for DIC. He was on prednisone for some time, but stopped as it was not helping much for DIC.       08/28/2016 - 09/01/2016 Hospital Admission    He has had issues with recurrent L sided pleural effusions, DIC and melena. He was last admitted in 1/25, given supportive care with a thoracentesis and blood transfusion. Dr Burr Medico has been following DIC and has requested a direct admit for melena, anemia and thrombocytopenia. GI called for EGD.  08/31/2016 Pathology Results    Bone Marrow Biopsy, right iliac - METASTATIC ADENOCARCINOMA. - SEE COMMENT. PERIPHERAL BLOOD: - NORMOCYTIC ANEMIA. - THROMBOCYTOPENIA.      08/31/2016 Progression    Bone marrow biopsy showed metastatic adenocarcinoma      09/04/2016 - 06/26/2017 Chemotherapy    FOLFOX every 2 weeks; changed to maintenance 5-FU  every 2 weeks starting 02/06/17. Stopped after 06/26/17 due to significant increase in CEA.       09/19/2016 Procedure    Therapeutic thoracentesis of a left pleural effusion performed on 09/19/16 yielded 1.5 liters of blood-tinged fluid. Reactive appearing mesothelial cells were present.      11/12/2016 PET scan     IMPRESSION: 1. Heterogeneous bony uptake with several mildly avid foci primarily in the lower thoracic spine, lumbar spine, and pelvic bones consistent with known bony metastatic disease. 2. There is new uptake in the right adrenal gland not seen previously with no CT correlate. Recommend attention on follow-up. 3. A rounded focus of uptake in the anterior peripheral left prostate is larger in the interval. The findings are nonspecific and could be infectious, inflammatory, or neoplastic. Focal prostatitis or prostate cancer are most likely. 4. The uptake in the left adrenal gland has decreased in the interval. 5. Mild uptake in the bilateral hila may simply represent reactive nodes. Recommend attention on follow-up. 6. The left-sided pleural effusion is a little larger in the interval with loculated components.      02/20/2017 PET scan    PET 02/20/17 IMPRESSION: 1. No activity suspicious for metastatic adenocarcinoma status post esophagectomy and gastric pull-through. 2. Stable mild prominence of the left adrenal gland and associated hypermetabolic activity from several prior studies. The lack of change implies a benign etiology. No abnormal activity within the right adrenal gland. 3. Persistent focal activity peripherally in the left lobe of the prostate, unchanged from the most recent study. Appearance is nonspecific, and could reflect prostate cancer or a benign nodule. Focal prostatitis less likely given persistence. 4. Stable loculated left pleural effusion and left basilar atelectasis. 5. These results were called by telephone at the time of interpretation on  02/20/2017 at 1:47 pm to Dr. Truitt Merle , who verbally acknowledged these results.      05/23/2017 PET scan    PET  IMPRESSION: 1. No significant change compared to 02/19/2017. 2. No specific findings to suggest metastatic disease in this patient who is status post esophagectomy and gastric pull-through. 3. Left adrenal hypermetabolism and nodularity are unchanged over multiple prior exams, again favoring a benign etiology. 4. Left anterior prostatic hypermetabolism is similar and should be correlated with PSA and possibly prostate MRI, if not performed. 5. Left larger than right pleural effusions, with left-sided mild loculation. 6. Left nephrolithiasis. 7. Coronary artery atherosclerosis. Aortic Atherosclerosis      07/19/2017 -  Chemotherapy    Started second line therapy with Taxol on day 1,8 and 15 and ramucirumab on day 1 and 15, every 28 days starting 07/19/17      11/01/2017 Imaging    CTA CHEST PE / CT ABD & PEL IMPRESSION: 1. No CT findings for pulmonary embolism. 2. No mediastinal or hilar mass or adenopathy. 3. Bilateral pleural effusions with overlying atelectasis. 4. No worrisome pulmonary lesions. 5. Surgical changes from gastric pull-through procedure. No findings suspicious for recurrent tumor or adenopathy. 6. Stable left adrenal gland lesion. 7. Diffuse appearing mixed lytic and sclerotic process involving the bony structures consistent with metastatic disease.  CURRENT THERAPY: second line therapy with Taxolon day 1,8 and 15and ramucirumab on day 1 and 15, every 28 days starting 07/19/17, due to significant fatigue I reduced Taxol to every 2 weeks starting 09/12/17.Taxol held from cycle 4 on 10/23/17 due to progressive dyspnea and weakness.   INTERVAL HISTORY: Please see below for problem oriented charting.  REVIEW OF SYSTEMS:   Constitutional: Denies fevers, chills or abnormal weight loss Eyes: Denies blurriness of vision Ears, nose, mouth, throat, and  face: Denies mucositis or sore throat Respiratory: Denies cough, dyspnea or wheezes Cardiovascular: Denies palpitation, chest discomfort or lower extremity swelling Gastrointestinal:  Denies nausea, heartburn or change in bowel habits Skin: Denies abnormal skin rashes Lymphatics: Denies new lymphadenopathy or easy bruising Neurological:Denies numbness, tingling or new weaknesses Behavioral/Psych: Mood is stable, no new changes  All other systems were reviewed with the patient and are negative.  MEDICAL HISTORY:  Past Medical History:  Diagnosis Date  . Esophageal cancer ([place]) 11/23/15   lower 3rd esohagus   . GERD (gastroesophageal reflux disease)   . Hyperlipidemia   . Hypertension     SURGICAL HISTORY: Past Surgical History:  Procedure Laterality Date  . CHEST TUBE INSERTION Left 04/23/2016   Procedure: CHEST TUBE INSERTION;  Surgeon: [name], MD;  Location: [place];  Service: Thoracic;  Laterality: Left;  . COLONOSCOPY N/A 08/30/2016   Procedure: COLONOSCOPY;  Surgeon: Milus Banister, MD;  Location: [facility];  Service: Endoscopy;  Laterality: N/A;  . COMPLETE ESOPHAGECTOMY N/A 04/23/2016   Procedure: TRANSHIATIAL TOTAL ESOPHAGECTOMY COMPLETE; CERVICAL ESOPHAGOGASTROSTOMY AND PYLOROPLASTY;  Surgeon: [name], MD;  Location: [place];  Service: Thoracic;  Laterality: N/A;  . cystoscope  4/12/1   prostate  . ERCP    . ESOPHAGOGASTRODUODENOSCOPY (EGD) WITH PROPOFOL N/A 08/29/2016   Procedure: ESOPHAGOGASTRODUODENOSCOPY (EGD) WITH PROPOFOL;  Surgeon: Milus Banister, MD;  Location: [facility];  Service: Endoscopy;  Laterality: N/A;  . INGUINAL HERNIA REPAIR    . IR GENERIC HISTORICAL  08/31/2016   IR FLUORO GUIDE PORT INSERTION RIGHT 08/31/2016 Corrie Mckusick, DO [facility]  . IR GENERIC HISTORICAL  08/31/2016   IR US GUIDE VASC ACCESS RIGHT 08/31/2016 Corrie Mckusick, DO [facility]  . IR REPLC DUODEN/JEJUNO TUBE PERCUT W/FLUORO  09/06/2017  . JEJUNOSTOMY N/A 04/23/2016    Procedure: FEEDING JEJUNOSTOMY;  Surgeon: [name], MD;  Location: [place];  Service: Thoracic;  Laterality: N/A;  . LEG SURGERY Left    hole  . PROSTATE BIOPSY  10/05/15  . right shoulder surgery    . VIDEO BRONCHOSCOPY N/A 04/23/2016   Procedure: VIDEO BRONCHOSCOPY;  Surgeon: [name], MD;  Location: [facility] OR;  Service: Thoracic;  Laterality: N/A;    I have reviewed the social history and family history with the patient and they are unchanged from previous note.  ALLERGIES:  is allergic to no known allergies.  MEDICATIONS:  Current Outpatient Medications  Medication Sig Dispense Refill  . acetaminophen (TYLENOL) 325 MG tablet Take 650 mg by mouth every 6 (six) hours as needed.    Marland Kitchen atenolol (TENORMIN) 25 MG tablet Take 1 tab in the morning and half tab in the evening (Patient taking differently: Take 12.5-25 mg by mouth 2 (two) times daily. Take 28m in the morning and 12.546min the evening) 60 tablet 1  . dexamethasone (DECADRON) 4 MG tablet Take 1 tablet (4 mg total) by mouth daily. 5 tablet 0  . folic acid (FOLVITE) 1 MG tablet Take  1 tablet (1 mg total) by mouth daily. 30 tablet 0  . lidocaine-prilocaine (EMLA) cream Apply 1 application topically as needed. Apply 1-2 tsp over port site 1 hour prior to chemotherapy 30 g 1  . mirtazapine (REMERON) 15 MG tablet Take 1 tablet (15 mg total) by mouth at bedtime. 30 tablet 2  . Nutritional Supplements (NUTREN 1.5) LIQD 250 mLs by Jejunal Tube route at bedtime. 3 CANS VIA FEEDING TUBE    . ondansetron (ZOFRAN ODT) 8 MG disintegrating tablet Take 1 tablet (8 mg total) by mouth every 8 (eight) hours as needed for nausea or vomiting. 30 tablet 2  . oxycodone (OXY-IR) 5 MG capsule Take 5 mg by mouth every 6 (six) hours as needed for pain.     Marland Kitchen prochlorperazine (COMPAZINE) 10 MG tablet Take 1 tablet (10 mg total) by mouth every 6 (six) hours as needed for nausea or vomiting. 30 tablet 3  . vitamin B-12 1000 MCG tablet Take 1 tablet  (1,000 mcg total) by mouth daily. 30 tablet 0   No current facility-administered medications for this visit.     PHYSICAL EXAMINATION: ECOG PERFORMANCE STATUS: {CHL ONC ECOG PS:[id number]}  There were no vitals filed for this visit. There were no vitals filed for this visit.  GENERAL:alert, no distress and comfortable SKIN: skin color, texture, turgor are normal, no rashes or significant lesions EYES: normal, Conjunctiva are pink and non-injected, sclera clear OROPHARYNX:no exudate, no erythema and lips, buccal mucosa, and tongue normal  NECK: supple, thyroid normal size, non-tender, without nodularity LYMPH:  no palpable lymphadenopathy in the cervical, axillary or inguinal LUNGS: clear to auscultation and percussion with normal breathing effort HEART: regular rate & rhythm and no murmurs and no lower extremity edema ABDOMEN:abdomen soft, non-tender and normal bowel sounds Musculoskeletal:no cyanosis of digits and no clubbing  NEURO: alert & oriented x 3 with fluent speech, no focal motor/sensory deficits  LABORATORY DATA:  I have reviewed the data as listed CBC Latest Ref Rng & Units 10/23/2017 10/08/2017 09/25/2017  WBC 4.0 - 10.3 K/uL 4.7 4.6 4.7  Hemoglobin 13.0 - 17.1 g/dL - - -  Hematocrit 38.4 - 49.9 % 31.3(L) 31.9(L) 31.8(L)  Platelets 140 - 400 K/uL 264 239 191     CMP Latest Ref Rng & Units 10/23/2017 10/08/2017 09/25/2017  Glucose 70 - 140 mg/dL 114 108 144(H)  BUN 7 - 26 mg/dL _0 Creatinine 0.70 - 1.30 mg/dL 0.59(L) 0.62(L) 0.63(L)  Sodium 136 - 145 mmol/L 130(L) 131(L) 132(L)  Potassium 3.5 - 5.1 mmol/L 4.8 4.5 4.7  Chloride 98 - 109 mmol/L 101 101 102  CO2 22 - 29 mmol/L 23 21(L) 21(L)  Calcium 8.4 - 10.4 mg/dL 8.8 8.9 9.0  Total Protein 6.4 - 8.3 g/dL 6.2(L) 6.3(L) 6.2(L)  Total Bilirubin 0.2 - 1.2 mg/dL 0.4 0.4 0.4  Alkaline Phos 40 - 150 U/L 186(H) 195(H) 174(H)  AST 5 - 34 U/L _1 ALT 0 - 55 U/L _2 CEA  02/20/2017: 2.89 05/15/2017:  57.0 06/12/2017: 255 06/26/2017: 632 07/19/2017: 3138 08/02/2017: 5016 08/21/17: 1480.02 09/25/17: 350.07 10/23/17: 160.61  Pathology report [facility] 09/29/2016   PD-L1 Testing 09/29/2016   Diagnosis 09/27/2016 Consult- Comprehensive, [id number] - 1A Mass, Lower third of Esophagus, Mucosal Biopsy - ADENOCARCINOMA WITH EXTRACELLULAR MUCIN. - SEE COMMENT. Microscopic Comment Provided Her2 IHC with the appropriate controls is negative. Per report Her2 FISH is also negative. There is limited tissue remaining, but [employer]  One and PDL-1 testing will be attempted as requested.    RADIOGRAPHIC STUDIES: I have personally reviewed the radiological images as listed and agreed with the findings in the report. No results found.   ASSESSMENT & PLAN: 77 y.o.male presented with dysphagia with solid food  1. 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 Dizziness - resolved  3. DIC, hemolytic anemia and thrombocytopenia, secondary to #1 4. Recurrent left pleural effusion, possibly related to esophagectomy s/p thoracentesis x3 - cytology negative 5. HTN 6. Malnutrition 7. Anorexia, depression, insomnia 8. Fatigue 9. J tube leak, skin breakdown 10. BPH, normal PSA 12/12/16 11. 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 Dyspnea on exertion/standing 13. Dehydration    No problem-specific Assessment & Plan notes found for this encounter.   No orders of the defined types were placed in this encounter.  All questions were answered. The patient knows to call the clinic with any problems, questions or concerns. No barriers to learning was detected. I spent {CHL ONC TIME VISIT - XYVOP:[id number]} counseling the patient face to face. The total time spent in the appointment was {CHL ONC TIME VISIT - NOTRR:[id number]} and more than 50% was on counseling  and review of test results     Alla Feeling, NP 11/06/17

## 2017-11-06 NOTE — Telephone Encounter (Signed)
IR left thoracentesis next week PER 4/17

## 2017-11-13 ENCOUNTER — Ambulatory Visit: Payer: Non-veteran care | Admitting: Nurse Practitioner

## 2017-11-13 ENCOUNTER — Ambulatory Visit: Payer: Non-veteran care

## 2017-11-13 ENCOUNTER — Other Ambulatory Visit: Payer: Non-veteran care

## 2017-11-14 ENCOUNTER — Ambulatory Visit (INDEPENDENT_AMBULATORY_CARE_PROVIDER_SITE_OTHER): Payer: Non-veteran care | Admitting: Cardiothoracic Surgery

## 2017-11-14 VITALS — BP 119/68 | HR 85 | Resp 20 | Ht 65.0 in | Wt 129.8 lb

## 2017-11-14 DIAGNOSIS — C159 Malignant neoplasm of esophagus, unspecified: Secondary | ICD-10-CM | POA: Diagnosis not present

## 2017-11-14 DIAGNOSIS — J90 Pleural effusion, not elsewhere classified: Secondary | ICD-10-CM

## 2017-11-14 DIAGNOSIS — C787 Secondary malignant neoplasm of liver and intrahepatic bile duct: Secondary | ICD-10-CM | POA: Diagnosis not present

## 2017-11-14 NOTE — Progress Notes (Signed)
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 SURGEON:  Lanelle Bal, M.D.  Cancer Staging Malignant neoplasm of lower third of esophagus [facility]) Staging form: Esophagus - Adenocarcinoma, AJCC 7th Edition - Clinical stage from 12/15/2015: Stage IIIA (T3, N1, M0) - Signed by Truitt Merle, MD on 12/24/2015 - Pathologic stage from 04/26/2016: yT0, N0, cM0, GX - Signed by [name], MD on 04/27/2016    History of Present Illness:     Patient returns to the office today in follow-up after esophageal resection. In February 2018 he had developed significant anemia requiring transfusions upper GI endoscopy and colonoscopy did not reveal any source of bleeding or recurrent tumor, bone marrow biopsy revealed metastatic adenocarcinoma.   To check his feeding jejunostomy to he also notes that he has had increasing shortness of breath as to have a thoracentesis done tomorrow in radiology.  CT scans done several weeks ago showed moderate left pleural effusion and small right pleural effusion.   diagnosis Bone Marrow Biopsy, right iliac - METASTATIC ADENOCARCINOMA. - SEE COMMENT. PERIPHERAL BLOOD: - NORMOCYTIC ANEMIA. - THROMBOCYTOPENIA.  PLEURAL FLUID, LEFT (SPECIMEN 1 OF 1 COLLECTED  07/04/16): REACTIVE APPEARING MESOTHELIAL CELLS. Enid Cutter MD Pathologist, Electronic Signature     Past Medical History:  Diagnosis Date  . Esophageal cancer ([place]) 11/23/15   lower 3rd esohagus   . GERD (gastroesophageal reflux disease)   . Hyperlipidemia   . Hypertension      Social History   Tobacco Use  Smoking Status Former Smoker  . Packs/day: 1.00  . Years: 10.00  . Pack years: 10.00  . Last attempt to quit: 07/24/1967  . Years since quitting: 50.3  Smokeless Tobacco Never Used    Social History   Substance and Sexual Activity  Alcohol Use No     Allergies  Allergen Reactions  . No Known Allergies     Current Outpatient Medications  Medication Sig Dispense Refill  . acetaminophen (TYLENOL) 325 MG tablet Take 650 mg by mouth every 6 (six) hours as needed.    Marland Kitchen amoxicillin-clavulanate (AUGMENTIN) 875-125 MG tablet Take 1 tablet by mouth 2 (two) times daily. 14 tablet 0  . atenolol (TENORMIN) 25 MG tablet Take 1 tab in the morning and half tab in the evening (Patient taking differently: Take 12.5-25 mg by mouth 2 (two) times daily. Take 60m in the morning and 12.587min the evening) 60 tablet 1  . dexamethasone (DECADRON) 4 MG tablet Take 1 tablet (4 mg total) by mouth daily. 5 tablet 0  . folic acid (FOLVITE) 1 MG tablet Take 1 tablet (1 mg total) by mouth daily. 30 tablet 0  . lidocaine-prilocaine (EMLA) cream Apply 1 application topically as needed. Apply 1-2 tsp over port site 1 hour prior to chemotherapy 30 g 1  . megestrol (MEGACE ES) 625 MG/5ML  suspension Take 5 mLs (625 mg total) by mouth daily. 150 mL 0  . mirtazapine (REMERON) 15 MG tablet Take 1 tablet (15 mg total) by mouth at bedtime. 30 tablet 2  . Nutritional Supplements (NUTREN 1.5) LIQD 250 mLs by Jejunal Tube route at bedtime. 3 CANS VIA FEEDING TUBE    . ondansetron (ZOFRAN ODT) 8 MG disintegrating tablet Take 1 tablet (8 mg total) by mouth every 8 (eight) hours as needed for nausea or  vomiting. 30 tablet 2  . oxycodone (OXY-IR) 5 MG capsule Take 5 mg by mouth every 6 (six) hours as needed for pain.     Marland Kitchen prochlorperazine (COMPAZINE) 10 MG tablet Take 1 tablet (10 mg total) by mouth every 6 (six) hours as needed for nausea or vomiting. 30 tablet 3  . vitamin B-12 1000 MCG tablet Take 1 tablet (1,000 mcg total) by mouth daily. 30 tablet 0   No current facility-administered medications for this visit.        Physical Exam: BP 119/68   Pulse 85   Resp 20   Ht 5' 5"  (1.651 m)   Wt 129 lb 12.8 oz (58.9 kg)   SpO2 98% Comment: RA  BMI 21.60 kg/m   Wt Readings from Last 3 Encounters:  11/14/17 129 lb 12.8 oz (58.9 kg)  11/06/17 131 lb 1.6 oz (59.5 kg)  10/23/17 130 lb 6.4 oz (59.1 kg)  General appearance: alert, cooperative, cachectic and fatigued Head: Normocephalic, without obvious abnormality, atraumatic, Loss of hair noted Neck: no adenopathy, no carotid bruit, no JVD, supple, symmetrical, trachea midline and thyroid not enlarged, symmetric, no tenderness/mass/nodules Lymph nodes: Cervical, supraclavicular, and axillary nodes normal. Resp: diminished breath sounds LLL Cardio: regular rate and rhythm, S1, S2 normal, no murmur, click, rub or gallop GI: J-tube in good position without leakage around it Extremities: extremities normal, atraumatic, no cyanosis or edema and Homans sign is negative, no sign of DVT Neurologic: Grossly normal  Diagnostic Studies & Laboratory data:     Recent Radiology Findings:   Ct Angio Chest Pe W Or Wo Contrast  Result Date: 11/01/2017 CLINICAL DATA:  Shortness of breath and dyspnea on exertion. History of esophageal cancer since 2017. EXAM: CT ANGIOGRAPHY CHEST CT ABDOMEN AND PELVIS WITH CONTRAST TECHNIQUE: Multidetector CT imaging of the chest was performed using the standard protocol during bolus administration of intravenous contrast. Multiplanar CT image reconstructions and MIPs were obtained to evaluate the vascular anatomy.  Multidetector CT imaging of the abdomen and pelvis was performed using the standard protocol during bolus administration of intravenous contrast. CONTRAST:  169m ISOVUE-370 IOPAMIDOL (ISOVUE-370) INJECTION 76% COMPARISON:  Multiple prior PET CTs from 2018. FINDINGS: CTA CHEST FINDINGS Cardiovascular: The heart is normal in size. No pericardial effusion. There is mild tortuosity, ectasia and calcification of the thoracic aorta but no focal aneurysm or dissection. Scattered coronary artery calcifications are noted. Right IJ Port-A-Cath is in good position with the tip just into the right atrium. The pulmonary arterial tree is well opacified. No filling defects to suggest pulmonary embolism. Mediastinum/Nodes: Small scattered mediastinal and hilar lymph nodes but no mass or overt adenopathy. Surgical changes from a gastric pull-through procedure. No complicating features are identified. No adenopathy or recurrent tumor. Lungs/Pleura: Moderate-sized bilateral pleural effusions, left greater than right. The left effusion is at least partially loculated. Overlying atelectasis is noted, particularly on the left. I do not see any worrisome pulmonary lesions or metastatic pulmonary nodules. Musculoskeletal: No chest wall mass, supraclavicular or axillary lymphadenopathy.  Mixed lytic and sclerotic process throughout the bony structures suggesting metastatic disease. This appears stable. Review of the MIP images confirms the above findings. CT ABDOMEN and PELVIS FINDINGS Hepatobiliary: Tiny scattered low-attenuation hepatic lesions are likely benign cysts. No worrisome hepatic lesions or intrahepatic biliary dilatation. The portal and hepatic veins are patent. The gallbladder is grossly normal. No common bile duct dilatation. Pancreas: Moderate pancreatic atrophy but no mass or inflammation. No ductal dilatation. Spleen: Spleen is upper limits of normal in size and stable. No splenic lesions. Adrenals/Urinary Tract: Stable  left adrenal gland nodule. The right adrenal gland is normal and stable. Stable renal cysts. No worrisome renal lesions. No hydronephrosis. The bladder is unremarkable. Stomach/Bowel: Gastric pull-through changes. The duodenum is unremarkable. There is a feeding jejunostomy tube in place. No complicating features. No small bowel obstruction. The colon is unremarkable. No lesions or obstructive findings. The terminal ileum is normal. Vascular/Lymphatic: The aorta is normal in caliber. The branch vessels are patent. The major venous structures are patent. Small scattered mesenteric and retroperitoneal lymph nodes but no mass or adenopathy. Reproductive: Enlarged prostate gland with median lobe hypertrophy impressing on the base of the bladder. The seminal vesicles are unremarkable. Other: Small amount of free pelvic fluid. No pelvic adenopathy. No inguinal adenopathy. The bony structures are stable. Musculoskeletal: Diffuse mixed lytic and sclerotic process involving the bony structures consistent with metastatic disease. Review of the MIP images confirms the above findings. IMPRESSION: 1. No CT findings for pulmonary embolism. 2. No mediastinal or hilar mass or adenopathy. 3. Bilateral pleural effusions with overlying atelectasis. 4. No worrisome pulmonary lesions. 5. Surgical changes from gastric pull-through procedure. No findings suspicious for recurrent tumor or adenopathy. 6. Stable left adrenal gland lesion. 7. Diffuse appearing mixed lytic and sclerotic process involving the bony structures consistent with metastatic disease. Electronically Signed   By: Marijo Sanes M.D.   On: 11/01/2017 17:05   Ct Abdomen Pelvis W Contrast  Result Date: 11/01/2017 CLINICAL DATA:  Shortness of breath and dyspnea on exertion. History of esophageal cancer since 2017. EXAM: CT ANGIOGRAPHY CHEST CT ABDOMEN AND PELVIS WITH CONTRAST TECHNIQUE: Multidetector CT imaging of the chest was performed using the standard protocol  during bolus administration of intravenous contrast. Multiplanar CT image reconstructions and MIPs were obtained to evaluate the vascular anatomy. Multidetector CT imaging of the abdomen and pelvis was performed using the standard protocol during bolus administration of intravenous contrast. CONTRAST:  151m ISOVUE-370 IOPAMIDOL (ISOVUE-370) INJECTION 76% COMPARISON:  Multiple prior PET CTs from 2018. FINDINGS: CTA CHEST FINDINGS Cardiovascular: The heart is normal in size. No pericardial effusion. There is mild tortuosity, ectasia and calcification of the thoracic aorta but no focal aneurysm or dissection. Scattered coronary artery calcifications are noted. Right IJ Port-A-Cath is in good position with the tip just into the right atrium. The pulmonary arterial tree is well opacified. No filling defects to suggest pulmonary embolism. Mediastinum/Nodes: Small scattered mediastinal and hilar lymph nodes but no mass or overt adenopathy. Surgical changes from a gastric pull-through procedure. No complicating features are identified. No adenopathy or recurrent tumor. Lungs/Pleura: Moderate-sized bilateral pleural effusions, left greater than right. The left effusion is at least partially loculated. Overlying atelectasis is noted, particularly on the left. I do not see any worrisome pulmonary lesions or metastatic pulmonary nodules. Musculoskeletal: No chest wall mass, supraclavicular or axillary lymphadenopathy. Mixed lytic and sclerotic process throughout the bony structures suggesting metastatic disease. This appears stable. Review of the MIP images confirms the above findings.  CT ABDOMEN and PELVIS FINDINGS Hepatobiliary: Tiny scattered low-attenuation hepatic lesions are likely benign cysts. No worrisome hepatic lesions or intrahepatic biliary dilatation. The portal and hepatic veins are patent. The gallbladder is grossly normal. No common bile duct dilatation. Pancreas: Moderate pancreatic atrophy but no mass or  inflammation. No ductal dilatation. Spleen: Spleen is upper limits of normal in size and stable. No splenic lesions. Adrenals/Urinary Tract: Stable left adrenal gland nodule. The right adrenal gland is normal and stable. Stable renal cysts. No worrisome renal lesions. No hydronephrosis. The bladder is unremarkable. Stomach/Bowel: Gastric pull-through changes. The duodenum is unremarkable. There is a feeding jejunostomy tube in place. No complicating features. No small bowel obstruction. The colon is unremarkable. No lesions or obstructive findings. The terminal ileum is normal. Vascular/Lymphatic: The aorta is normal in caliber. The branch vessels are patent. The major venous structures are patent. Small scattered mesenteric and retroperitoneal lymph nodes but no mass or adenopathy. Reproductive: Enlarged prostate gland with median lobe hypertrophy impressing on the base of the bladder. The seminal vesicles are unremarkable. Other: Small amount of free pelvic fluid. No pelvic adenopathy. No inguinal adenopathy. The bony structures are stable. Musculoskeletal: Diffuse mixed lytic and sclerotic process involving the bony structures consistent with metastatic disease. Review of the MIP images confirms the above findings. IMPRESSION: 1. No CT findings for pulmonary embolism. 2. No mediastinal or hilar mass or adenopathy. 3. Bilateral pleural effusions with overlying atelectasis. 4. No worrisome pulmonary lesions. 5. Surgical changes from gastric pull-through procedure. No findings suspicious for recurrent tumor or adenopathy. 6. Stable left adrenal gland lesion. 7. Diffuse appearing mixed lytic and sclerotic process involving the bony structures consistent with metastatic disease. Electronically Signed   By: Marijo Sanes M.D.   On: 11/01/2017 17:05   I have independently reviewed the above radiology studies  and reviewed the findings with the patient.    Study Result   CLINICAL DATA:  Subsequent treatment  strategy for esophageal cancer.  EXAM: NUCLEAR MEDICINE PET SKULL BASE TO THIGH  TECHNIQUE: 6.6 mCi F-18 FDG was injected intravenously. Full-ring PET imaging was performed from the skull base to thigh after the radiotracer. CT data was obtained and used for attenuation correction and anatomic localization.  FASTING BLOOD GLUCOSE:  Value: 123 mg/dl  COMPARISON:  PET-CT 11/12/2016 and 08/14/2016.  FINDINGS: NECK  No hypermetabolic cervical lymph nodes are identified.There are no lesions of the pharyngeal mucosal space.  CHEST  There are no hypermetabolic mediastinal, hilar or axillary lymph nodes. Patient is status post esophagectomy and gastric pull-through. There is no abnormal associated metabolic activity. There is no suspicious pleural or pulmonary activity. Moderate size partially loculated left pleural effusion and small dependent right pleural effusion are unchanged. There is stable dependent left lower lobe atelectasis. No suspicious pulmonary nodule.  ABDOMEN/PELVIS  There is no hypermetabolic activity within the liver, right adrenal gland, spleen or pancreas. Mildly hypermetabolic left adrenal nodule is again noted. This has an SUV max of 4.9 (3.7 previously, and 6.2 on the study before that There is no hypermetabolic nodal activity. Focal hypermetabolic activity is again noted in the left prostate lobe anteriorly. This has an SUV max of 14.4 also similar to prior study. There is stable metabolic activity surrounding the insertion of the gastrojejunostomy. Bilateral renal cysts are noted.  SKELETON  There is no hypermetabolic activity to suggest osseous metastatic disease. Stable mild chronic compression deformity at L1. There are few scattered Schmorl's nodes in the thoracic spine.  IMPRESSION: 1. No  activity suspicious for metastatic adenocarcinoma status post esophagectomy and gastric pull-through. 2. Stable mild prominence of the left  adrenal gland and associated hypermetabolic activity from several prior studies. The lack of change implies a benign etiology. No abnormal activity within the right adrenal gland. 3. Persistent focal activity peripherally in the left lobe of the prostate, unchanged from the most recent study. Appearance is nonspecific, and could reflect prostate cancer or a benign nodule. Focal prostatitis less likely given persistence. 4. Stable loculated left pleural effusion and left basilar atelectasis. 5. These results were called by telephone at the time of interpretation on 02/20/2017 at 1:47 pm to Dr. Truitt Merle , who verbally acknowledged these results.   Electronically Signed   By: Richardean Sale M.D.   On: 02/20/2017 13:50        Recent Lab Findings: Lab Results  Component Value Date   WBC 14.4 (H) 11/06/2017   HGB 11.4 (L) 11/06/2017   HCT 34.1 (L) 11/06/2017   PLT 143 11/06/2017   GLUCOSE 112 11/06/2017   ALT 28 11/06/2017   AST 32 11/06/2017   NA 128 (L) 11/06/2017   K 4.9 11/06/2017   CL 99 11/06/2017   CREATININE 0.60 (L) 11/06/2017   BUN 13 11/06/2017   CO2 22 11/06/2017   TSH 3.164 10/23/2017   INR 1.33 07/24/2017   Wt Readings from Last 3 Encounters:  11/14/17 129 lb 12.8 oz (58.9 kg)  11/06/17 131 lb 1.6 oz (59.5 kg)  10/23/17 130 lb 6.4 oz (59.1 kg)      Assessment / Plan:    Patient range clinically stable with known stage IV esophageal cancer currently on no chemotherapy now because of side effects Increasing bilateral pleural effusions left greater than right on CT scan done 2 weeks ago, to have thoracentesis done tomorrow-I discussed with the patient and his wife the use of Pleurx catheter should he continue to have recurrent pleural effusions  Plan to see him back in 6 weeks or sooner if he needs further drainage of his pleural effusions  I  spent 15 minutes with  the patient face to face and greater then 50% of the time was spent in counseling and  coordination of care.   [name] MD      [address] [place] [address] Office [phone]   Beeper [phone]  11/14/2017 10:58 AM

## 2017-11-15 ENCOUNTER — Encounter (HOSPITAL_COMMUNITY): Payer: Self-pay | Admitting: Radiology

## 2017-11-15 ENCOUNTER — Ambulatory Visit (HOSPITAL_COMMUNITY)
Admission: RE | Admit: 2017-11-15 | Discharge: 2017-11-15 | Disposition: A | Discharge status: Home / Self Care | Payer: Non-veteran care | Source: Ambulatory Visit | Attending: Hematology | Admitting: Hematology

## 2017-11-15 ENCOUNTER — Ambulatory Visit (HOSPITAL_COMMUNITY)
Admission: RE | Admit: 2017-11-15 | Discharge: 2017-11-15 | Disposition: A | Discharge status: Home / Self Care | Payer: Non-veteran care | Source: Ambulatory Visit | Attending: Radiology | Admitting: Radiology

## 2017-11-15 DIAGNOSIS — J90 Pleural effusion, not elsewhere classified: Secondary | ICD-10-CM | POA: Insufficient documentation

## 2017-11-15 DIAGNOSIS — J939 Pneumothorax, unspecified: Secondary | ICD-10-CM | POA: Insufficient documentation

## 2017-11-15 DIAGNOSIS — C155 Malignant neoplasm of lower third of esophagus: Secondary | ICD-10-CM

## 2017-11-15 DIAGNOSIS — Z9889 Other specified postprocedural states: Secondary | ICD-10-CM | POA: Diagnosis not present

## 2017-11-15 HISTORY — PX: IR THORACENTESIS ASP PLEURAL SPACE W/IMG GUIDE: IMG5380

## 2017-11-15 MED ORDER — LIDOCAINE HCL (PF) 2 % IJ SOLN
INTRAMUSCULAR | Status: AC
  Filled 2017-11-15: qty 20

## 2017-11-15 MED ORDER — LIDOCAINE HCL (PF) 2 % IJ SOLN
INTRAMUSCULAR | Status: DC | PRN
  Administered 2017-11-15: 10 mL

## 2017-11-15 NOTE — Procedures (Signed)
PROCEDURE SUMMARY:  Successful US guided left thoracentesis. Yielded 900 mL of clear yellow fluid. Pt tolerated procedure well. No immediate complications.  Specimen was sent for labs. CXR ordered.  Ascencion Dike PA-C 11/15/2017 1:27 PM

## 2017-11-20 ENCOUNTER — Inpatient Hospital Stay: Payer: Non-veteran care

## 2017-11-20 ENCOUNTER — Inpatient Hospital Stay (HOSPITAL_BASED_OUTPATIENT_CLINIC_OR_DEPARTMENT_OTHER): Payer: Non-veteran care | Admitting: Nurse Practitioner

## 2017-11-20 ENCOUNTER — Inpatient Hospital Stay: Payer: Non-veteran care | Attending: Hematology

## 2017-11-20 ENCOUNTER — Telehealth: Payer: Self-pay | Admitting: Nurse Practitioner

## 2017-11-20 ENCOUNTER — Encounter: Payer: Self-pay | Admitting: Nurse Practitioner

## 2017-11-20 VITALS — BP 107/52 | HR 91 | Temp 98.6°F | Resp 17 | Ht 65.0 in | Wt 129.4 lb

## 2017-11-20 VITALS — BP 107/51 | HR 94 | Resp 16

## 2017-11-20 DIAGNOSIS — G4700 Insomnia, unspecified: Secondary | ICD-10-CM | POA: Insufficient documentation

## 2017-11-20 DIAGNOSIS — E860 Dehydration: Secondary | ICD-10-CM | POA: Diagnosis not present

## 2017-11-20 DIAGNOSIS — R140 Abdominal distension (gaseous): Secondary | ICD-10-CM | POA: Diagnosis not present

## 2017-11-20 DIAGNOSIS — C155 Malignant neoplasm of lower third of esophagus: Secondary | ICD-10-CM

## 2017-11-20 DIAGNOSIS — Z87891 Personal history of nicotine dependence: Secondary | ICD-10-CM | POA: Insufficient documentation

## 2017-11-20 DIAGNOSIS — R0609 Other forms of dyspnea: Secondary | ICD-10-CM

## 2017-11-20 DIAGNOSIS — E46 Unspecified protein-calorie malnutrition: Secondary | ICD-10-CM | POA: Diagnosis not present

## 2017-11-20 DIAGNOSIS — F329 Major depressive disorder, single episode, unspecified: Secondary | ICD-10-CM | POA: Diagnosis not present

## 2017-11-20 DIAGNOSIS — E785 Hyperlipidemia, unspecified: Secondary | ICD-10-CM | POA: Insufficient documentation

## 2017-11-20 DIAGNOSIS — I700 Atherosclerosis of aorta: Secondary | ICD-10-CM | POA: Diagnosis not present

## 2017-11-20 DIAGNOSIS — K219 Gastro-esophageal reflux disease without esophagitis: Secondary | ICD-10-CM | POA: Diagnosis not present

## 2017-11-20 DIAGNOSIS — C7951 Secondary malignant neoplasm of bone: Secondary | ICD-10-CM | POA: Diagnosis not present

## 2017-11-20 DIAGNOSIS — E440 Moderate protein-calorie malnutrition: Secondary | ICD-10-CM

## 2017-11-20 DIAGNOSIS — D589 Hereditary hemolytic anemia, unspecified: Secondary | ICD-10-CM | POA: Diagnosis not present

## 2017-11-20 DIAGNOSIS — D65 Disseminated intravascular coagulation [defibrination syndrome]: Secondary | ICD-10-CM | POA: Insufficient documentation

## 2017-11-20 DIAGNOSIS — R530 Neoplastic (malignant) related fatigue: Secondary | ICD-10-CM | POA: Insufficient documentation

## 2017-11-20 DIAGNOSIS — N400 Enlarged prostate without lower urinary tract symptoms: Secondary | ICD-10-CM | POA: Diagnosis not present

## 2017-11-20 DIAGNOSIS — Z79899 Other long term (current) drug therapy: Secondary | ICD-10-CM | POA: Diagnosis not present

## 2017-11-20 DIAGNOSIS — I10 Essential (primary) hypertension: Secondary | ICD-10-CM | POA: Insufficient documentation

## 2017-11-20 DIAGNOSIS — G629 Polyneuropathy, unspecified: Secondary | ICD-10-CM | POA: Diagnosis not present

## 2017-11-20 DIAGNOSIS — J90 Pleural effusion, not elsewhere classified: Secondary | ICD-10-CM | POA: Insufficient documentation

## 2017-11-20 DIAGNOSIS — Z5112 Encounter for antineoplastic immunotherapy: Secondary | ICD-10-CM | POA: Diagnosis not present

## 2017-11-20 LAB — CBC WITH DIFFERENTIAL (CANCER CENTER ONLY)
Basophils Absolute: 0 10*3/uL (ref 0.0–0.1)
Basophils Relative: 0 %
Eosinophils Absolute: 0.1 10*3/uL (ref 0.0–0.5)
Eosinophils Relative: 1 %
HCT: 31.5 % — ABNORMAL LOW (ref 38.4–49.9)
HEMOGLOBIN: 10.7 g/dL — AB (ref 13.0–17.1)
LYMPHS ABS: 0.8 10*3/uL — AB (ref 0.9–3.3)
LYMPHS PCT: 8 %
MCH: 29.1 pg (ref 27.2–33.4)
MCHC: 34 g/dL (ref 32.0–36.0)
MCV: 85.6 fL (ref 79.3–98.0)
Monocytes Absolute: 1.6 10*3/uL — ABNORMAL HIGH (ref 0.1–0.9)
Monocytes Relative: 15 %
NEUTROS ABS: 8.3 10*3/uL — AB (ref 1.5–6.5)
Neutrophils Relative %: 76 %
Platelet Count: 186 10*3/uL (ref 140–400)
RBC: 3.68 MIL/uL — AB (ref 4.20–5.82)
RDW: 18.1 % — ABNORMAL HIGH (ref 11.0–14.6)
WBC: 10.8 10*3/uL — AB (ref 4.0–10.3)

## 2017-11-20 LAB — CMP (CANCER CENTER ONLY)
ALT: 19 U/L (ref 0–55)
ANION GAP: 9 (ref 3–11)
AST: 33 U/L (ref 5–34)
Albumin: 3 g/dL — ABNORMAL LOW (ref 3.5–5.0)
Alkaline Phosphatase: 272 U/L — ABNORMAL HIGH (ref 40–150)
BUN: 13 mg/dL (ref 7–26)
CHLORIDE: 97 mmol/L — AB (ref 98–109)
CO2: 22 mmol/L (ref 22–29)
Calcium: 8.4 mg/dL (ref 8.4–10.4)
Creatinine: 0.59 mg/dL — ABNORMAL LOW (ref 0.70–1.30)
GFR, Estimated: >60 mL/min (ref >60)
Glucose, Bld: 112 mg/dL (ref 70–140)
POTASSIUM: 5 mmol/L (ref 3.5–5.1)
SODIUM: 128 mmol/L — AB (ref 136–145)
Total Bilirubin: 0.5 mg/dL (ref 0.2–1.2)
Total Protein: 6 g/dL — ABNORMAL LOW (ref 6.4–8.3)

## 2017-11-20 LAB — CEA (IN HOUSE-CHCC): CEA (CHCC-In House): 1533.99 ng/mL — ABNORMAL HIGH (ref 0.00–5.00)

## 2017-11-20 MED ORDER — SODIUM CHLORIDE 0.9 % IV SOLN
Freq: Once | INTRAVENOUS | Status: AC
  Administered 2017-11-20: 11:00:00 via INTRAVENOUS

## 2017-11-20 MED ORDER — SODIUM CHLORIDE 0.9% FLUSH
10.0000 mL | INTRAVENOUS | Status: DC | PRN
  Administered 2017-11-20: 10 mL via INTRAVENOUS
  Filled 2017-11-20: qty 10

## 2017-11-20 MED ORDER — HEPARIN SOD (PORK) LOCK FLUSH 100 UNIT/ML IV SOLN
500.0000 [IU] | Freq: Once | INTRAVENOUS | Status: AC
  Administered 2017-11-20: 500 [IU] via INTRAVENOUS
  Filled 2017-11-20: qty 5

## 2017-11-20 NOTE — Patient Instructions (Signed)
Implanted Port Home Guide An implanted port is a type of central line that is placed under the skin. Central lines are used to provide IV access when treatment or nutrition needs to be given through a person's veins. Implanted ports are used for long-term IV access. An implanted port may be placed because:  You need IV medicine that would be irritating to the small veins in your hands or arms.  You need long-term IV medicines, such as antibiotics.  You need IV nutrition for a long period.  You need frequent blood draws for lab tests.  You need dialysis.  Implanted ports are usually placed in the chest area, but they can also be placed in the upper arm, the abdomen, or the leg. An implanted port has two main parts:  Reservoir. The reservoir is round and will appear as a small, raised area under your skin. The reservoir is the part where a needle is inserted to give medicines or draw blood.  Catheter. The catheter is a thin, flexible tube that extends from the reservoir. The catheter is placed into a large vein. Medicine that is inserted into the reservoir goes into the catheter and then into the vein.  How will I care for my incision site? Do not get the incision site wet. Bathe or shower as directed by your health care provider. How is my port accessed? Special steps must be taken to access the port:  Before the port is accessed, a numbing cream can be placed on the skin. This helps numb the skin over the port site.  Your health care provider uses a sterile technique to access the port. ? Your health care provider must put on a mask and sterile gloves. ? The skin over your port is cleaned carefully with an antiseptic and allowed to dry. ? The port is gently pinched between sterile gloves, and a needle is inserted into the port.  Only "non-coring" port needles should be used to access the port. Once the port is accessed, a blood return should be checked. This helps ensure that the port  is in the vein and is not clogged.  If your port needs to remain accessed for a constant infusion, a clear (transparent) bandage will be placed over the needle site. The bandage and needle will need to be changed every week, or as directed by your health care provider.  Keep the bandage covering the needle clean and dry. Do not get it wet. Follow your health care provider's instructions on how to take a shower or bath while the port is accessed.  If your port does not need to stay accessed, no bandage is needed over the port.  What is flushing? Flushing helps keep the port from getting clogged. Follow your health care provider's instructions on how and when to flush the port. Ports are usually flushed with saline solution or a medicine called heparin. The need for flushing will depend on how the port is used.  If the port is used for intermittent medicines or blood draws, the port will need to be flushed: ? After medicines have been given. ? After blood has been drawn. ? As part of routine maintenance.  If a constant infusion is running, the port may not need to be flushed.  How long will my port stay implanted? The port can stay in for as long as your health care provider thinks it is needed. When it is time for the port to come out, surgery will be   done to remove it. The procedure is similar to the one performed when the port was put in. When should I seek immediate medical care? When you have an implanted port, you should seek immediate medical care if:  You notice a bad smell coming from the incision site.  You have swelling, redness, or drainage at the incision site.  You have more swelling or pain at the port site or the surrounding area.  You have a fever that is not controlled with medicine.  This information is not intended to replace advice given to you by your health care provider. Make sure you discuss any questions you have with your health care provider. Document  Released: 07/09/2005 Document Revised: 12/15/2015 Document Reviewed: 03/16/2013 [employer] Interactive Patient Education  2017 [employer]  

## 2017-11-20 NOTE — Progress Notes (Signed)
[place]  [phone] [phone]  Clinic Follow up Note   Patient Care Team: [name], MD as PCP - General (Internal Medicine) Truitt Merle, MD as Consulting Physician (Hematology) Kyung Rudd, MD as Consulting Physician (Radiation Oncology) [name], MD as Consulting Physician (Cardiothoracic Surgery) Karie Mainland, RD as Dietitian (Nutrition) Minus Breeding, MD as Consulting Physician (Cardiology) 11/20/2017  SUMMARY OF ONCOLOGIC HISTORY: Oncology History   Presented with solid dysphagia at least daily with chest pain. Involuntary weight loss of 10-12 lb over past year  Malignant neoplasm of lower third of esophagus (HCC)   Staging form: Esophagus - Adenocarcinoma, AJCC 7th Edition   - Clinical stage from 12/15/2015: Stage IIIA (T3, N1, M0) - Signed by Truitt Merle, MD on 12/24/2015   - Pathologic stage from 04/26/2016: yT0, N0, cM0, GX - Signed by [name], MD on 04/27/2016      Malignant neoplasm of lower third of esophagus ([place])   11/23/2015 Procedure    EGD per [facility](Dr. Toledo):6cm mass in lower third of esophagus      11/24/2015 Initial Diagnosis    Esophageal cancer ([place])      11/24/2015 Pathology Results    Mucinous adenocarcinoma, moderately differentiated-Her2 analysis pending      12/05/2015 Imaging    PET scan showed hypermetabolic a mass in distal esophagus, hypermetabolic activity in the left anterior prostate gland, metabolic density in left adrenal nodule, CT attenuation suggest a benign adrenal adenoma. Left apical 2 mm lung nodule.      12/15/2015 Procedure    EUS showed a uT3N1 lesion from 27cm to 35cm from the incisors       12/26/2015 - 02/02/2016 Chemotherapy    Weekly carboplatin AUC 2 and Taxol 45 mg/m, with concurrent radiation      12/26/2015 - 02/02/2016 Radiation Therapy    Neoadjuvant chemoradiation to the esophageal cancer      04/23/2016 Surgery    total  esophagectomy with cervical esophagogastrostomy, pyloroplasty, feeding jejunostomy       04/23/2016 Pathology Results    Esophagectomy showed no residual tumor, 8 lymph nodes were all negative, incidental leiomyoma 0.2 cm      08/14/2016 PET scan    IMPRESSION: 1. Small hypermetabolic left adrenal mass, this has not changed in size compared to 12/01/15 but has a maximum standard uptake value of 6.2 (previously 4.2). Indeterminate density and MRI characteristics, not specific for adenoma. Early metastatic disease is not readily excluded although the lack of change of size is somewhat reassuring. Surveillance recommended. 2. No other hypermetabolic lesions are identified. 3. Moderate to large left pleural effusion with passive atelectasis. 4. Esophagectomy with gastric pull-through. 5. No hypermetabolic liver lesions are seen. 6. Enlarged prostate gland.      08/16/2016 - 08/23/2016 Hospital Admission    He was diagnosed with DIC, unclear etiology. He was given blood products. He underwent echocardiogram which was negative for endocarditis, CTA chest/abd which was negative for aneurysm or dissection as work up for DIC. He was on prednisone for some time, but stopped as it was not helping much for DIC.       08/28/2016 - 09/01/2016 Hospital Admission    He has had issues with recurrent L sided pleural effusions, DIC and melena. He was last admitted in 1/25, given supportive care with a thoracentesis and blood transfusion. Dr Burr Medico has been following DIC and has requested a direct admit for melena, anemia and thrombocytopenia. GI called for EGD.  08/31/2016 Pathology Results    Bone Marrow Biopsy, right iliac - METASTATIC ADENOCARCINOMA. - SEE COMMENT. PERIPHERAL BLOOD: - NORMOCYTIC ANEMIA. - THROMBOCYTOPENIA.      08/31/2016 Progression    Bone marrow biopsy showed metastatic adenocarcinoma      09/04/2016 - 06/26/2017 Chemotherapy    FOLFOX every 2 weeks; changed to maintenance 5-FU  every 2 weeks starting 02/06/17. Stopped after 06/26/17 due to significant increase in CEA.       09/19/2016 Procedure    Therapeutic thoracentesis of a left pleural effusion performed on 09/19/16 yielded 1.5 liters of blood-tinged fluid. Reactive appearing mesothelial cells were present.      11/12/2016 PET scan     IMPRESSION: 1. Heterogeneous bony uptake with several mildly avid foci primarily in the lower thoracic spine, lumbar spine, and pelvic bones consistent with known bony metastatic disease. 2. There is new uptake in the right adrenal gland not seen previously with no CT correlate. Recommend attention on follow-up. 3. A rounded focus of uptake in the anterior peripheral left prostate is larger in the interval. The findings are nonspecific and could be infectious, inflammatory, or neoplastic. Focal prostatitis or prostate cancer are most likely. 4. The uptake in the left adrenal gland has decreased in the interval. 5. Mild uptake in the bilateral hila may simply represent reactive nodes. Recommend attention on follow-up. 6. The left-sided pleural effusion is a little larger in the interval with loculated components.      02/20/2017 PET scan    PET 02/20/17 IMPRESSION: 1. No activity suspicious for metastatic adenocarcinoma status post esophagectomy and gastric pull-through. 2. Stable mild prominence of the left adrenal gland and associated hypermetabolic activity from several prior studies. The lack of change implies a benign etiology. No abnormal activity within the right adrenal gland. 3. Persistent focal activity peripherally in the left lobe of the prostate, unchanged from the most recent study. Appearance is nonspecific, and could reflect prostate cancer or a benign nodule. Focal prostatitis less likely given persistence. 4. Stable loculated left pleural effusion and left basilar atelectasis. 5. These results were called by telephone at the time of interpretation on  02/20/2017 at 1:47 pm to Dr. Truitt Merle , who verbally acknowledged these results.      05/23/2017 PET scan    PET  IMPRESSION: 1. No significant change compared to 02/19/2017. 2. No specific findings to suggest metastatic disease in this patient who is status post esophagectomy and gastric pull-through. 3. Left adrenal hypermetabolism and nodularity are unchanged over multiple prior exams, again favoring a benign etiology. 4. Left anterior prostatic hypermetabolism is similar and should be correlated with PSA and possibly prostate MRI, if not performed. 5. Left larger than right pleural effusions, with left-sided mild loculation. 6. Left nephrolithiasis. 7. Coronary artery atherosclerosis. Aortic Atherosclerosis      07/19/2017 -  Chemotherapy    Started second line therapy with Taxol on day 1,8 and 15 and ramucirumab on day 1 and 15, every 28 days starting 07/19/17      11/01/2017 Imaging    CTA CHEST PE / CT ABD & PEL IMPRESSION: 1. No CT findings for pulmonary embolism. 2. No mediastinal or hilar mass or adenopathy. 3. Bilateral pleural effusions with overlying atelectasis. 4. No worrisome pulmonary lesions. 5. Surgical changes from gastric pull-through procedure. No findings suspicious for recurrent tumor or adenopathy. 6. Stable left adrenal gland lesion. 7. Diffuse appearing mixed lytic and sclerotic process involving the bony structures consistent with metastatic disease.  CURRENT THERAPY:  second line therapy with Taxolon day 1,8 and 15and ramucirumab on day 1 and 15, every 28 days starting 07/19/17, due to significant fatigue I reduced Taxol to every 2 weeks starting 09/12/17.    INTERVAL HISTORY: Fred [name] returns for follow up as scheduled. He had IR thoracentesis with almost 1 L removal on 11/15/17. His dyspnea has improved slightly, but still gets very short of breath after 5-10 steps and on standing. He remains very weak with little improvement despite 1 month  treatment break. Cough with clear phelgm is at baseline, no chest pain. His nose is congested and occasionally blows blood clots. Has been using saline nose spray with some relief. Has eye drainage, using visine and saline drops occasionally. Completed augmentin last week. Still having low grade fevers ~100 , present now for 2-3 weeks. Takes tylenol occasionally. Denies dysuria. J tube site is less red with minimal drainage. He saw Dr. Servando Snare last week for f/u. He has no appetite, did not fill megace because it was too expensive. He has mild nausea, well-managed with compazine PRN.   REVIEW OF SYSTEMS:   Constitutional: Denies fevers, chills or abnormal weight loss (+) fatigue (+) activity intolerance (+) low grade fevers over 2-3 weeks (+) little po intake Eyes: Denies blurriness of vision (+) eye drainage  Ears, nose, mouth, throat, and face: Denies mucositis or sore throat (+) nasal congestion (+) blood with nose blowing  Respiratory: Denies wheezes (+) baseline cough with clear phlegm (+) dyspnea on standing and with minimal activity, slightly improved s/p thoracentesis  Cardiovascular: Denies palpitation, chest discomfort or lower extremity swelling Gastrointestinal:  Denies vomiting, constipation, diarrhea, heartburn or change in bowel habits (+) mild nausea, improved with compazine  (+) J tube GU: Denies dysuria or hematuria  Skin: Denies abnormal skin rashes Lymphatics: Denies new lymphadenopathy or easy bruising Neurological:Denies numbness, tingling (+) weakness Behavioral/Psych: Mood is stable, no new changes  All other systems were reviewed with the patient and are negative.  MEDICAL HISTORY:  Past Medical History:  Diagnosis Date  . Esophageal cancer ([place]) 11/23/15   lower 3rd esohagus   . GERD (gastroesophageal reflux disease)   . Hyperlipidemia   . Hypertension     SURGICAL HISTORY: Past Surgical History:  Procedure Laterality Date  . CHEST TUBE INSERTION Left 04/23/2016    Procedure: CHEST TUBE INSERTION;  Surgeon: [name], MD;  Location: [place];  Service: Thoracic;  Laterality: Left;  . COLONOSCOPY N/A 08/30/2016   Procedure: COLONOSCOPY;  Surgeon: Milus Banister, MD;  Location: [facility];  Service: Endoscopy;  Laterality: N/A;  . COMPLETE ESOPHAGECTOMY N/A 04/23/2016   Procedure: TRANSHIATIAL TOTAL ESOPHAGECTOMY COMPLETE; CERVICAL ESOPHAGOGASTROSTOMY AND PYLOROPLASTY;  Surgeon: [name], MD;  Location: [place];  Service: Thoracic;  Laterality: N/A;  . cystoscope  4/12/1   prostate  . ERCP    . ESOPHAGOGASTRODUODENOSCOPY (EGD) WITH PROPOFOL N/A 08/29/2016   Procedure: ESOPHAGOGASTRODUODENOSCOPY (EGD) WITH PROPOFOL;  Surgeon: Milus Banister, MD;  Location: [facility];  Service: Endoscopy;  Laterality: N/A;  . INGUINAL HERNIA REPAIR    . IR GENERIC HISTORICAL  08/31/2016   IR FLUORO GUIDE PORT INSERTION RIGHT 08/31/2016 Corrie Mckusick, DO [facility]  . IR GENERIC HISTORICAL  08/31/2016   IR US GUIDE VASC ACCESS RIGHT 08/31/2016 Corrie Mckusick, DO [facility]  . IR REPLC DUODEN/JEJUNO TUBE PERCUT W/FLUORO  09/06/2017  . IR THORACENTESIS ASP PLEURAL SPACE W/IMG GUIDE  11/15/2017  . JEJUNOSTOMY N/A 04/23/2016  Procedure: FEEDING JEJUNOSTOMY;  Surgeon: [name], MD;  Location: [place];  Service: Thoracic;  Laterality: N/A;  . LEG SURGERY Left    hole  . PROSTATE BIOPSY  10/05/15  . right shoulder surgery    . VIDEO BRONCHOSCOPY N/A 04/23/2016   Procedure: VIDEO BRONCHOSCOPY;  Surgeon: [name], MD;  Location[facility];  Service: Thoracic;  Laterality: N/A;    I have reviewed the social history and family history with the patient and they are unchanged from previous note.  ALLERGIES:  is allergic to no known allergies.  MEDICATIONS:  Current Outpatient Medications  Medication Sig Dispense Refill  . acetaminophen (TYLENOL) 325 MG tablet Take 650 mg by mouth every 6 (six) hours as needed.    Marland Kitchen atenolol (TENORMIN) 25 MG tablet Take 1 tab  in the morning and half tab in the evening (Patient taking differently: Take 12.5-25 mg by mouth 2 (two) times daily. Take 39m in the morning and 12.529min the evening) 60 tablet 1  . mirtazapine (REMERON) 15 MG tablet Take 1 tablet (15 mg total) by mouth at bedtime. 30 tablet 2  . Nutritional Supplements (NUTREN 1.5) LIQD 250 mLs by Jejunal Tube route at bedtime. 3 CANS VIA FEEDING TUBE    . prochlorperazine (COMPAZINE) 10 MG tablet Take 1 tablet (10 mg total) by mouth every 6 (six) hours as needed for nausea or vomiting. 30 tablet 3  . vitamin B-12 1000 MCG tablet Take 1 tablet (1,000 mcg total) by mouth daily. 30 tablet 0  . amoxicillin-clavulanate (AUGMENTIN) 875-125 MG tablet Take 1 tablet by mouth 2 (two) times daily. 14 tablet 0  . dexamethasone (DECADRON) 4 MG tablet Take 1 tablet (4 mg total) by mouth daily. 5 tablet 0  . folic acid (FOLVITE) 1 MG tablet Take 1 tablet (1 mg total) by mouth daily. 30 tablet 0  . lidocaine-prilocaine (EMLA) cream Apply 1 application topically as needed. Apply 1-2 tsp over port site 1 hour prior to chemotherapy 30 g 1  . megestrol (MEGACE ES) 625 MG/5ML suspension Take 5 mLs (625 mg total) by mouth daily. 150 mL 0  . ondansetron (ZOFRAN ODT) 8 MG disintegrating tablet Take 1 tablet (8 mg total) by mouth every 8 (eight) hours as needed for nausea or vomiting. 30 tablet 2  . oxycodone (OXY-IR) 5 MG capsule Take 5 mg by mouth every 6 (six) hours as needed for pain.      No current facility-administered medications for this visit.     PHYSICAL EXAMINATION: ECOG PERFORMANCE STATUS: 3 - Symptomatic, >50% confined to bed  Vitals:   11/20/17 1002  BP: (!) 107/52  Pulse: 91  Resp: 17  Temp: 98.6 F (37 C)  SpO2: 100%   Filed Weights   11/20/17 1002  Weight: 129 lb 6.4 oz (58.7 kg)    GENERAL:alert, no distress, flat affect, presents in wheelchair  SKIN: pale, dry skin without rashes or significant lesions EYES: normal, Conjunctiva are pink and  non-injected, sclera clear (+) dried drainage to left > right eye OROPHARYNX:no exudate, no erythema and lips, buccal mucosa, and tongue normal  LYMPH:  no palpable lymphadenopathy in the cervical, axillary or inguinal LUNGS: clear to auscultation and percussion with normal breathing effort HEART: regular rate & rhythm and no murmurs and no lower extremity edema ABDOMEN:abdomen soft, non-tender and normal bowel sounds.  J-tube to left abdomen in place, mild surrounding erythema without drainage Musculoskeletal:no cyanosis of digits and no clubbing  NEURO: alert &  oriented x 3 with fluent speech PAC without erythema   LABORATORY DATA:  I have reviewed the data as listed CBC Latest Ref Rng & Units 11/20/2017 11/06/2017 10/23/2017  WBC 4.0 - 10.3 K/uL 10.8(H) 14.4(H) 4.7  Hemoglobin 13.0 - 17.1 g/dL 10.7(L) 11.4(L) 10.4(L)  Hematocrit 38.4 - 49.9 % 31.5(L) 34.1(L) 31.3(L)  Platelets 140 - 400 K/uL 186 143 264     CMP Latest Ref Rng & Units 11/20/2017 11/06/2017 10/23/2017  Glucose 70 - 140 mg/dL 112 112 114  BUN 7 - 26 mg/dL _0 Creatinine 0.70 - 1.30 mg/dL 0.59(L) 0.60(L) 0.59(L)  Sodium 136 - 145 mmol/L 128(L) 128(L) 130(L)  Potassium 3.5 - 5.1 mmol/L 5.0 4.9 4.8  Chloride 98 - 109 mmol/L 97(L) 99 101  CO2 22 - 29 mmol/L _1 Calcium 8.4 - 10.4 mg/dL 8.4 8.7 8.8  Total Protein 6.4 - 8.3 g/dL 6.0(L) 6.3(L) 6.2(L)  Total Bilirubin 0.2 - 1.2 mg/dL 0.5 0.4 0.4  Alkaline Phos 40 - 150 U/L 272(H) 216(H) 186(H)  AST 5 - 34 U/L 33 32 22  ALT 0 - 55 U/L _2 CEA  02/20/2017: 2.89 05/15/2017: 57.0 06/12/2017: 255 06/26/2017: 632 07/19/2017: 3138 08/02/2017: 5016 08/21/17: 1480.02 09/25/17: 350.07 10/23/17: 160.61 11/20/17: 1533.99  Pathology report [facility] 09/29/2016   PD-L1 Testing 09/29/2016   Diagnosis 09/27/2016 Consult- Comprehensive, [id number] - 1A Mass, Lower third of Esophagus, Mucosal Biopsy - ADENOCARCINOMA WITH EXTRACELLULAR MUCIN. - SEE  COMMENT. Microscopic Comment Provided Her2 IHC with the appropriate controls is negative. Per report Her2 FISH is also negative. There is limited tissue remaining, but [facility] and PDL-1 testing will be attempted as requested.  Diagnosis 09/19/2016 PLEURAL FLUID, LEFT (SPECIMEN 1 OF 1 COLLECTED 09/19/16): REACTIVE APPEARING MESOTHELIAL CELLS PRESENT.  Diagnosis 08/31/2016 Bone Marrow Biopsy, right iliac - METASTATIC ADENOCARCINOMA. - SEE COMMENT. PERIPHERAL BLOOD: - NORMOCYTIC ANEMIA. - THROMBOCYTOPENIA.  Diagnosis 08/02/16 PLEURAL FLUID, LEFT (SPECIMEN 1 OF 1 COLLECTED 08/02/16): REACTIVE MESOTHELIAL CELLS PRESENT. LYMPHOCYTES PRESENT.  Diagnosis 11/23/2015 Mass, lower third of the esophagus, mucosal biopsy Mucinous adenocarcinoma, moderately differentiated.  Diagnosis 04/23/2016 1. Lymph node, biopsy, Cervical - ONE OF ONE LYMPH NODES NEGATIVE FOR CARCINOMA (0/1). 2. Lymph node, biopsy, Periesophageal - BLOOD WITH SCANT BENIGN SOFT TISSUE AND SKELETAL MUSCLE. - NO MALIGNANCY IDENTIFIED. 3. Esophagogastrectomy - ULCERATION WITH INFLAMMATION AND REACTIVE CHANGES. - FOCAL INTESTINAL METAPLASIA. - CHANGES CONSISTENT WITH TREATMENT EFFECT. - SEVEN OF SEVEN LYMPH NODES NEGATIVE FOR CARCINOMA (0/7). - INCIDENTAL LEIOMYOMA, 0.2 CM. - MARGINS ARE NEGATIVE FOR DYSPLASIA OR MALIGNANCY. - SEE ONCOLOGY TABLE. Microscopic Comment 3. ESOPHAGUS: Specimen: Esophagus and proximal stomach, cervical lymph node. Procedure: Esophagogastrectomy and cervical lymph node biopsy. Tumor Site: Presumed GE junction, see comment. Relationship of Tumor to esophagogastric junction: Can not be assessed. Distance of tumor center from esophagogastric junction: Can not be assessed. Tumor Size Greatest dimension: No residual tumor present. Histologic Type: Per medical record adenocarcinoma (no biopsy for review). Histologic Grade: N/A. Microscopic Tumor Extension: N/A. Margins:  Negative. Treatment Effect: Present (TRS 0). Lymph-Vascular Invasion: Not identified. Perineural Invasion: Not identified. Lymph nodes: number examined 8; number positive: 0 TNM: ypT0, ypN0 see comment. 1 of 3 FINAL for Fred [name], Fred [name] ([id number]) Microscopic Comment(continued) Ancillary studies: None. Comments: The entire GE junction is submitted for evaluation. There is ulceration with associated inflammation. There is acellular mucin/myxoid changes which extend through the muscularis propria, but no viable/residual tumor is identified and thus the stage is  ypT0. There is focal background intestinal metaplasia, which may have been pre-existing (Barrett's esophagus) or related to therapy.    RADIOGRAPHIC STUDIES: I have personally reviewed the radiological images as listed and agreed with the findings in the report. No results found.   ASSESSMENT & PLAN: 77 y.o. male presented with dysphagia with solid food  1. 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 DIC, hemolytic anemia and thrombocytopenia 3. Recurrent left pleural effusion  4. HTN 5. Malnutrition 6. Anorexia, depression, insomnia  7. Fatigue, dyspnea on exertion 8. Goals of care discussion  9. 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 ? Bronchitis   Mr. Whipp appears stable.  He continues on treatment break, last Cyramza 10/23/17, last Taxol 10/09/2017. Dyspnea and weakness have not improved much.  His performance status remains low.  I recommend he increase p.o. intake and increase nighttime nutrition to 4 cans per J-tube.  He will try to increase daytime p.o. solids and liquids. For nasal congestion I recommend he continue nasal spray as needed and use humidifier to moisten the air at home.  For his eye drainage I recommend warm compress and to stop Visine.  He is mildly hypotensive today, weight is stable.  Leukocytosis is improving, s/p Augmentin.  CMP is stable.  CEA has increased markedly, now back up to 1533 from 160 four weeks ago, likely from treatment break. Will monitor closely. Anticipate his anemia will worsen and he might need blood transfusion in the future. T/s at next visit.  Continue to hold anticancer treatment.  Will hydrate today with 1 L NS over 2 hours for hypotension and hyponatremia, indicating dehydration.  Follow-up with Dr. Burr Medico in 2 weeks to monitor status and discuss further treatment decisions.  PLAN -Continue to hold taxol/cyramza -IVF 1 L over 2 hours today -Nasal saline spray, humidifier for nasal congestion -Warm compress for eye drainage -Increase nighttime nutrition to 4 cans per J-tube, increase po nutrition -T/s with next lab draw -lab, f/u with Dr. Burr Medico in 2 weeks   All questions were answered. The patient knows to call the clinic with any problems, questions or concerns. No barriers to learning was detected. I spent 20 minutes counseling the patient face to face. The total time spent in the appointment was 25 minutes and more than 50% was on counseling and review of test results     Alla Feeling, NP 11/20/17

## 2017-11-20 NOTE — Patient Instructions (Signed)
Dehydration, Adult Dehydration is a condition in which there is not enough fluid or water in the body. This happens when you lose more fluids than you take in. Important organs, such as the kidneys, brain, and heart, cannot function without a proper amount of fluids. Any loss of fluids from the body can lead to dehydration. Dehydration can range from mild to severe. This condition should be treated right away to prevent it from becoming severe. What are the causes? This condition may be caused by:  Vomiting.  Diarrhea.  Excessive sweating, such as from heat exposure or exercise.  Not drinking enough fluid, especially: ? When ill. ? While doing activity that requires a lot of energy.  Excessive urination.  Fever.  Infection.  Certain medicines, such as medicines that cause the body to lose excess fluid (diuretics).  Inability to access safe drinking water.  Reduced physical ability to get adequate water and food.  What increases the risk? This condition is more likely to develop in people:  Who have a poorly controlled long-term (chronic) illness, such as diabetes, heart disease, or kidney disease.  Who are age 65 or older.  Who are disabled.  Who live in a place with high altitude.  Who play endurance sports.  What are the signs or symptoms? Symptoms of mild dehydration may include:  Thirst.  Dry lips.  Slightly dry mouth.  Dry, warm skin.  Dizziness. Symptoms of moderate dehydration may include:  Very dry mouth.  Muscle cramps.  Dark urine. Urine may be the color of tea.  Decreased urine production.  Decreased tear production.  Heartbeat that is irregular or faster than normal (palpitations).  Headache.  Light-headedness, especially when you stand up from a sitting position.  Fainting (syncope). Symptoms of severe dehydration may include:  Changes in skin, such as: ? Cold and clammy skin. ? Blotchy (mottled) or pale skin. ? Skin that does  not quickly return to normal after being lightly pinched and released (poor skin turgor).  Changes in body fluids, such as: ? Extreme thirst. ? No tear production. ? Inability to sweat when body temperature is high, such as in hot weather. ? Very little urine production.  Changes in vital signs, such as: ? Weak pulse. ? Pulse that is more than 100 beats a minute when sitting still. ? Rapid breathing. ? Low blood pressure.  Other changes, such as: ? Sunken eyes. ? Cold hands and feet. ? Confusion. ? Lack of energy (lethargy). ? Difficulty waking up from sleep. ? Short-term weight loss. ? Unconsciousness. How is this diagnosed? This condition is diagnosed based on your symptoms and a physical exam. Blood and urine tests may be done to help confirm the diagnosis. How is this treated? Treatment for this condition depends on the severity. Mild or moderate dehydration can often be treated at home. Treatment should be started right away. Do not wait until dehydration becomes severe. Severe dehydration is an emergency and it needs to be treated in a hospital. Treatment for mild dehydration may include:  Drinking more fluids.  Replacing salts and minerals in your blood (electrolytes) that you may have lost. Treatment for moderate dehydration may include:  Drinking an oral rehydration solution (ORS). This is a drink that helps you replace fluids and electrolytes (rehydrate). It can be found at pharmacies and retail stores. Treatment for severe dehydration may include:  Receiving fluids through an IV tube.  Receiving an electrolyte solution through a feeding tube that is passed through your nose   and into your stomach (nasogastric tube, or NG tube).  Correcting any abnormalities in electrolytes.  Treating the underlying cause of dehydration. Follow these instructions at home:  If directed by your health care provider, drink an ORS: ? Make an ORS by following instructions on the  package. ? Start by drinking small amounts, about  cup (120 mL) every 5-10 minutes. ? Slowly increase how much you drink until you have taken the amount recommended by your health care provider.  Drink enough clear fluid to keep your urine clear or pale yellow. If you were told to drink an ORS, finish the ORS first, then start slowly drinking other clear fluids. Drink fluids such as: ? Water. Do not drink only water. Doing that can lead to having too little salt (sodium) in the body (hyponatremia). ? Ice chips. ? Fruit juice that you have added water to (diluted fruit juice). ? Low-calorie sports drinks.  Avoid: ? Alcohol. ? Drinks that contain a lot of sugar. These include high-calorie sports drinks, fruit juice that is not diluted, and soda. ? Caffeine. ? Foods that are greasy or contain a lot of fat or sugar.  Take over-the-counter and prescription medicines only as told by your health care provider.  Do not take sodium tablets. This can lead to having too much sodium in the body (hypernatremia).  Eat foods that contain a healthy balance of electrolytes, such as bananas, oranges, potatoes, tomatoes, and spinach.  Keep all follow-up visits as told by your health care provider. This is important. Contact a health care provider if:  You have abdominal pain that: ? Gets worse. ? Stays in one area (localizes).  You have a rash.  You have a stiff neck.  You are more irritable than usual.  You are sleepier or more difficult to wake up than usual.  You feel weak or dizzy.  You feel very thirsty.  You have urinated only a small amount of very dark urine over 6-8 hours. Get help right away if:  You have symptoms of severe dehydration.  You cannot drink fluids without vomiting.  Your symptoms get worse with treatment.  You have a fever.  You have a severe headache.  You have vomiting or diarrhea that: ? Gets worse. ? Does not go away.  You have blood or green matter  (bile) in your vomit.  You have blood in your stool. This may cause stool to look black and tarry.  You have not urinated in 6-8 hours.  You faint.  Your heart rate while sitting still is over 100 beats a minute.  You have trouble breathing. This information is not intended to replace advice given to you by your health care provider. Make sure you discuss any questions you have with your health care provider. Document Released: 07/09/2005 Document Revised: 02/03/2016 Document Reviewed: 09/02/2015 [employer] Interactive Patient Education  2018 [employer]  

## 2017-11-20 NOTE — Telephone Encounter (Signed)
Scheduled appt per 5/1 los - Gave patient AVS and calender per los.  

## 2017-12-02 NOTE — Progress Notes (Signed)
[place]  [phone] [phone]  Clinic Follow up Note   Patient Care Team: [name], MD as PCP - General (Internal Medicine) Truitt Merle, MD as Consulting Physician (Hematology) Kyung Rudd, MD as Consulting Physician (Radiation Oncology) [name], MD as Consulting Physician (Cardiothoracic Surgery) Karie Mainland, RD as Dietitian (Nutrition) Minus Breeding, MD as Consulting Physician (Cardiology)   Date of Service:  12/03/2017    CHIEF COMPLAINTS:  Follow up metastatic esophageal cancer    Oncology History   Presented with solid dysphagia at least daily with chest pain. Involuntary weight loss of 10-12 lb over past year  Malignant neoplasm of lower third of esophagus (HCC)   Staging form: Esophagus - Adenocarcinoma, AJCC 7th Edition   - Clinical stage from 12/15/2015: Stage IIIA (T3, N1, M0) - Signed by Truitt Merle, MD on 12/24/2015   - Pathologic stage from 04/26/2016: yT0, N0, cM0, GX - Signed by [name], MD on 04/27/2016      Malignant neoplasm of lower third of esophagus ([place])   11/23/2015 Procedure    EGD per [facility](Dr. Toledo):6cm mass in lower third of esophagus      11/24/2015 Initial Diagnosis    Esophageal cancer ([place])      11/24/2015 Pathology Results    Mucinous adenocarcinoma, moderately differentiated-Her2 analysis pending      12/05/2015 Imaging    PET scan showed hypermetabolic a mass in distal esophagus, hypermetabolic activity in the left anterior prostate gland, metabolic density in left adrenal nodule, CT attenuation suggest a benign adrenal adenoma. Left apical 2 mm lung nodule.      12/15/2015 Procedure    EUS showed a uT3N1 lesion from 27cm to 35cm from the incisors       12/26/2015 - 02/02/2016 Chemotherapy    Weekly carboplatin AUC 2 and Taxol 45 mg/m, with concurrent radiation      12/26/2015 - 02/02/2016 Radiation Therapy    Neoadjuvant chemoradiation to the  esophageal cancer      04/23/2016 Surgery    total esophagectomy with cervical esophagogastrostomy, pyloroplasty, feeding jejunostomy       04/23/2016 Pathology Results    Esophagectomy showed no residual tumor, 8 lymph nodes were all negative, incidental leiomyoma 0.2 cm      08/14/2016 PET scan    IMPRESSION: 1. Small hypermetabolic left adrenal mass, this has not changed in size compared to 12/01/15 but has a maximum standard uptake value of 6.2 (previously 4.2). Indeterminate density and MRI characteristics, not specific for adenoma. Early metastatic disease is not readily excluded although the lack of change of size is somewhat reassuring. Surveillance recommended. 2. No other hypermetabolic lesions are identified. 3. Moderate to large left pleural effusion with passive atelectasis. 4. Esophagectomy with gastric pull-through. 5. No hypermetabolic liver lesions are seen. 6. Enlarged prostate gland.      08/16/2016 - 08/23/2016 Hospital Admission    He was diagnosed with DIC, unclear etiology. He was given blood products. He underwent echocardiogram which was negative for endocarditis, CTA chest/abd which was negative for aneurysm or dissection as work up for DIC. He was on prednisone for some time, but stopped as it was not helping much for DIC.       08/28/2016 - 09/01/2016 Hospital Admission    He has had issues with recurrent L sided pleural effusions, DIC and melena. He was last admitted in 1/25, given supportive care with a thoracentesis and blood transfusion. Dr Burr Medico has been following DIC  and has requested a direct admit for melena, anemia and thrombocytopenia. GI called for EGD.        08/31/2016 Pathology Results    Bone Marrow Biopsy, right iliac - METASTATIC ADENOCARCINOMA. - SEE COMMENT. PERIPHERAL BLOOD: - NORMOCYTIC ANEMIA. - THROMBOCYTOPENIA.      08/31/2016 Progression    Bone marrow biopsy showed metastatic adenocarcinoma      09/04/2016 - 06/26/2017 Chemotherapy      FOLFOX every 2 weeks; changed to maintenance 5-FU every 2 weeks starting 02/06/17. Stopped after 06/26/17 due to significant increase in CEA.       09/19/2016 Procedure    Therapeutic thoracentesis of a left pleural effusion performed on 09/19/16 yielded 1.5 liters of blood-tinged fluid. Reactive appearing mesothelial cells were present.      11/12/2016 PET scan     IMPRESSION: 1. Heterogeneous bony uptake with several mildly avid foci primarily in the lower thoracic spine, lumbar spine, and pelvic bones consistent with known bony metastatic disease. 2. There is new uptake in the right adrenal gland not seen previously with no CT correlate. Recommend attention on follow-up. 3. A rounded focus of uptake in the anterior peripheral left prostate is larger in the interval. The findings are nonspecific and could be infectious, inflammatory, or neoplastic. Focal prostatitis or prostate cancer are most likely. 4. The uptake in the left adrenal gland has decreased in the interval. 5. Mild uptake in the bilateral hila may simply represent reactive nodes. Recommend attention on follow-up. 6. The left-sided pleural effusion is a little larger in the interval with loculated components.      02/20/2017 PET scan    PET 02/20/17 IMPRESSION: 1. No activity suspicious for metastatic adenocarcinoma status post esophagectomy and gastric pull-through. 2. Stable mild prominence of the left adrenal gland and associated hypermetabolic activity from several prior studies. The lack of change implies a benign etiology. No abnormal activity within the right adrenal gland. 3. Persistent focal activity peripherally in the left lobe of the prostate, unchanged from the most recent study. Appearance is nonspecific, and could reflect prostate cancer or a benign nodule. Focal prostatitis less likely given persistence. 4. Stable loculated left pleural effusion and left basilar atelectasis. 5. These results were  called by telephone at the time of interpretation on 02/20/2017 at 1:47 pm to Dr. Truitt Merle , who verbally acknowledged these results.      05/23/2017 PET scan    PET  IMPRESSION: 1. No significant change compared to 02/19/2017. 2. No specific findings to suggest metastatic disease in this patient who is status post esophagectomy and gastric pull-through. 3. Left adrenal hypermetabolism and nodularity are unchanged over multiple prior exams, again favoring a benign etiology. 4. Left anterior prostatic hypermetabolism is similar and should be correlated with PSA and possibly prostate MRI, if not performed. 5. Left larger than right pleural effusions, with left-sided mild loculation. 6. Left nephrolithiasis. 7. Coronary artery atherosclerosis. Aortic Atherosclerosis      07/19/2017 -  Chemotherapy    Started second line therapy with Taxol on day 1,8 and 15 and ramucirumab on day 1 and 15, every 28 days starting 07/19/17      11/01/2017 Imaging    CTA CHEST PE / CT ABD & PEL IMPRESSION: 1. No CT findings for pulmonary embolism. 2. No mediastinal or hilar mass or adenopathy. 3. Bilateral pleural effusions with overlying atelectasis. 4. No worrisome pulmonary lesions. 5. Surgical changes from gastric pull-through procedure. No findings suspicious for recurrent tumor or adenopathy. 6. Stable  left adrenal gland lesion. 7. Diffuse appearing mixed lytic and sclerotic process involving the bony structures consistent with metastatic disease.        HISTORY OF PRESENTING ILLNESS (12/14/2015):  Fred [name] 77 y.o. male is here because of His newly diagnosed esophageal cancer. He is accompanied by his wife and daughter to my clinic today.  He has had dysphagia for 2 months, mainly with solid food, no dysphagia with soft food or liquid. He also has mild pain in mid chest when he swallows. No nausea, abdominal pain, or other discomfort. He has lost about 15 lbs in the last year, no other  symptoms. He was seen by his primary care physician at [facility], and referred to gastroenterologist Dr. Alice Reichert. He underwent EGD on 11/23/2015, which showed a 6 cm mass in the lower third of esophagus. Biopsy showed adenocarcinoma, moderately differentiated. He was referred to [place] to consider neoadjuvant therapy. He is scheduled to see a Psychologist, sport and exercise at [facility] later this week.  He has had some urinary frequency, underwent TURP on 11/02/2015.   CURRENT THERAPY:  second line therapy with Taxol on day 1,8 and 15 and ramucirumab on day 1 and 15, every 28 days starting 07/19/17, due to significant fatigue I reduced Taxol to every 2 weeks starting 09/12/17.    INTERIM HISTORY:  Fred [name] returns for follow up and treatment for his metastatic esophageal cancer. He presents to the clinic today accompanied by his wife and his daughter today. His wife notes that the patient is not able to complete his daily activities as he used to be able to. He feels as if he may have pneumonia today due to trouble breathing following his thoracentesis procedure.   Labs today show CBC at WBC at 23.7, RBC at 2.52, hgb at 7.5, HCT at 21.7, RDW at 23.4, PLT at 130K, Neutro at 19.7, monocytes at 2.3. CMP PENDING.   Since his last visit he had a thoracentesis completed on 11/15/2017 with pathology showing: Pleural fluid, left (specimen 1 of 1 collected, 11/15/2017). No malignant cells identified.   On review of systems, he reports weight loss, mild abdominal bloating, lack of appetite, purulent malodorous drainage from feeding tube, nausea. he denies any other symptoms. Pertinent positives are listed and detailed within the above HPI.   MEDICAL HISTORY:  Past Medical History:  Diagnosis Date  . Esophageal cancer ([place]) 11/23/15   lower 3rd esohagus   . GERD (gastroesophageal reflux disease)   . Hyperlipidemia   . Hypertension     SURGICAL HISTORY: Past Surgical History:  Procedure Laterality Date  . CHEST TUBE  INSERTION Left 04/23/2016   Procedure: CHEST TUBE INSERTION;  Surgeon: [name], MD;  Location: [place];  Service: Thoracic;  Laterality: Left;  . COLONOSCOPY N/A 08/30/2016   Procedure: COLONOSCOPY;  Surgeon: Milus Banister, MD;  Location: [facility];  Service: Endoscopy;  Laterality: N/A;  . COMPLETE ESOPHAGECTOMY N/A 04/23/2016   Procedure: TRANSHIATIAL TOTAL ESOPHAGECTOMY COMPLETE; CERVICAL ESOPHAGOGASTROSTOMY AND PYLOROPLASTY;  Surgeon: [name], MD;  Location: [place];  Service: Thoracic;  Laterality: N/A;  . cystoscope  4/12/1   prostate  . ERCP    . ESOPHAGOGASTRODUODENOSCOPY (EGD) WITH PROPOFOL N/A 08/29/2016   Procedure: ESOPHAGOGASTRODUODENOSCOPY (EGD) WITH PROPOFOL;  Surgeon: Milus Banister, MD;  Location: [facility];  Service: Endoscopy;  Laterality: N/A;  . INGUINAL HERNIA REPAIR    . IR GENERIC HISTORICAL  08/31/2016   IR FLUORO GUIDE PORT INSERTION RIGHT 08/31/2016  Corrie Mckusick, DO [facility]  . IR GENERIC HISTORICAL  08/31/2016   IR US GUIDE VASC ACCESS RIGHT 08/31/2016 Corrie Mckusick, DO [facility]  . IR REPLC DUODEN/JEJUNO TUBE PERCUT W/FLUORO  09/06/2017  . IR THORACENTESIS ASP PLEURAL SPACE W/IMG GUIDE  11/15/2017  . JEJUNOSTOMY N/A 04/23/2016   Procedure: FEEDING JEJUNOSTOMY;  Surgeon: [name], MD;  Location: [place];  Service: Thoracic;  Laterality: N/A;  . LEG SURGERY Left    hole  . PROSTATE BIOPSY  10/05/15  . right shoulder surgery    . VIDEO BRONCHOSCOPY N/A 04/23/2016   Procedure: VIDEO BRONCHOSCOPY;  Surgeon: [name], MD;  Location: [facility] OR;  Service: Thoracic;  Laterality: N/A;    SOCIAL HISTORY: Social History   Socioeconomic History  . Marital status: Married    Spouse name: Not on file  . Number of children: 1  . Years of education: Not on file  . Highest education level: Not on file  Occupational History  . Occupation: [employer] operational officer  . Financial resource strain: Not on file  . Food insecurity:    Worry: Not  on file    Inability: Not on file  . Transportation needs:    Medical: Not on file    Non-medical: Not on file  Tobacco Use  . Smoking status: Former Smoker    Packs/day: 1.00    Years: 10.00    Pack years: 10.00    Last attempt to quit: 07/24/1967    Years since quitting: 50.3  . Smokeless tobacco: Never Used  Substance and Sexual Activity  . Alcohol use: No  . Drug use: No  . Sexual activity: Not Currently  Lifestyle  . Physical activity:    Days per week: Not on file    Minutes per session: Not on file  . Stress: Not on file  Relationships  . Social connections:    Talks on phone: Not on file    Gets together: Not on file    Attends religious service: Not on file    Active member of club or organization: Not on file    Attends meetings of clubs or organizations: Not on file    Relationship status: Not on file  . Intimate partner violence:    Fear of current or ex partner: Not on file    Emotionally abused: Not on file    Physically abused: Not on file    Forced sexual activity: Not on file  Other Topics Concern  . Not on file  Social History Narrative  . Not on file    FAMILY HISTORY: Family History  Problem Relation Age of Onset  . Cancer Maternal Grandfather 50       gastric cancer   . Alzheimer's disease Mother   . Diabetes Mother   . Stroke Father 26  . Multiple sclerosis Sister     ALLERGIES:  is allergic to no known allergies.  MEDICATIONS:  Current Outpatient Medications  Medication Sig Dispense Refill  . acetaminophen (TYLENOL) 325 MG tablet Take 650 mg by mouth every 6 (six) hours as needed.    Marland Kitchen atenolol (TENORMIN) 25 MG tablet Take 1 tab in the morning and half tab in the evening (Patient taking differently: Take 12.5-25 mg by mouth 2 (two) times daily. Take 53m in the morning and 12.553min the evening) 60 tablet 1  . dexamethasone (DECADRON) 4 MG tablet Take 1 tablet (4 mg total) by mouth daily. 5 tablet 0  .  folic acid (FOLVITE) 1 MG tablet  Take 1 tablet (1 mg total) by mouth daily. 30 tablet 0  . lidocaine-prilocaine (EMLA) cream Apply 1 application topically as needed. Apply 1-2 tsp over port site 1 hour prior to chemotherapy 30 g 1  . megestrol (MEGACE ES) 625 MG/5ML suspension Take 5 mLs (625 mg total) by mouth daily. 150 mL 0  . mirtazapine (REMERON) 15 MG tablet Take 1 tablet (15 mg total) by mouth at bedtime. 30 tablet 2  . Nutritional Supplements (NUTREN 1.5) LIQD 250 mLs by Jejunal Tube route at bedtime. 3 CANS VIA FEEDING TUBE    . ondansetron (ZOFRAN ODT) 8 MG disintegrating tablet Take 1 tablet (8 mg total) by mouth every 8 (eight) hours as needed for nausea or vomiting. 30 tablet 2  . oxycodone (OXY-IR) 5 MG capsule Take 5 mg by mouth every 6 (six) hours as needed for pain.     Marland Kitchen prochlorperazine (COMPAZINE) 10 MG tablet Take 1 tablet (10 mg total) by mouth every 6 (six) hours as needed for nausea or vomiting. 30 tablet 3  . vitamin B-12 1000 MCG tablet Take 1 tablet (1,000 mcg total) by mouth daily. 30 tablet 0   No current facility-administered medications for this visit.     REVIEW OF SYSTEMS:   Constitutional: Denies chills or abnormal night sweats   (+) significant fatigue and low energy (+) low grade fever yesterday Eyes: Denies blurriness of vision, double vision or watery eyes Ears, nose, mouth, throat, and face: Denies mucositis or sore throat  Respiratory: Denies wheezes (+) SOB upon walking (+) cough Cardiovascular: Denies palpitation, chest discomfort or lower extremity swelling (+) J tube  Gastrointestinal:  Denies heartburn or change in bowel habits Skin: Denies abnormal skin rashes (+) dry skin  Lymphatics: Denies new lymphadenopathy or easy bruising Neurological:Denies numbness, tingling or new weaknesses (+) Neuropathy, improved Behavioral/Psych: Mood is stable, no new changes  Musculoskeletal: (+) less ambulating All other systems were reviewed with the patient and are negative.  PHYSICAL  EXAMINATION:  ECOG PERFORMANCE STATUS: 2-3 Vitals:   12/03/17 1121  BP: 135/63  Pulse: (!) 107  Resp: (!) 22  Temp: 98.2 F (36.8 C)  TempSrc: Oral  SpO2: 100%  Height: _0  (1.651 m)    GENERAL:alert, no distress and comfortable, in wheelchair SKIN: Appears to be pale and cachetic appearing, skin texture, turgor are normal, no rashes or significant lesions EYES: normal, conjunctiva are pink and non-injected, sclera clear OROPHARYNX:no exudate, no erythema and lips, buccal mucosa, and tongue normal  NECK: supple, thyroid normal size, non-tender, without nodularity LYMPH:  no palpable lymphadenopathy in the cervical, axillary or inguinal LUNGS: clear to auscultation and percussion with normal breathing effort (+) pleural effusion L > R. Decrease breath sounds in the bilateral upper lungs. No other rales or rhonchi.  HEART: regular rate & rhythm and no murmurs and no lower extremity edema ABDOMEN:abdomen soft, non-tender and normal bowel sounds, J tube in place. No skin erythema or pus. (+) New J Tube placed, clean without any discharge. Abdomen is distended without tenderness.  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  LABORATORY DATA:  I have reviewed the data as listed CBC Latest Ref Rng & Units 12/03/2017 11/20/2017 11/06/2017  WBC 4.0 - 10.3 K/uL 23.7(H) 10.8(H) 14.4(H)  Hemoglobin 13.0 - 17.1 g/dL 7.5(L) 10.7(L) 11.4(L)  Hematocrit 38.4 -  49.9 % 21.7(L) 31.5(L) 34.1(L)  Platelets 140 - 400 K/uL 130(L) 186 143   CMP Latest Ref Rng & Units 11/20/2017 11/06/2017 10/23/2017  Glucose 70 - 140 mg/dL 112 112 114  BUN 7 - 26 mg/dL _0 Creatinine 0.70 - 1.30 mg/dL 0.59(L) 0.60(L) 0.59(L)  Sodium 136 - 145 mmol/L 128(L) 128(L) 130(L)  Potassium 3.5 - 5.1 mmol/L 5.0 4.9 4.8  Chloride 98 - 109 mmol/L 97(L) 99 101  CO2 22 - 29 mmol/L _1 Calcium 8.4 - 10.4  mg/dL 8.4 8.7 8.8  Total Protein 6.4 - 8.3 g/dL 6.0(L) 6.3(L) 6.2(L)  Total Bilirubin 0.2 - 1.2 mg/dL 0.5 0.4 0.4  Alkaline Phos 40 - 150 U/L 272(H) 216(H) 186(H)  AST 5 - 34 U/L 33 32 22  ALT 0 - 55 U/L _2 CEA  02/20/2017: 2.89 05/15/2017: 57.0 06/12/2017: 255 06/26/2017: 632 07/19/2017: 3138 08/02/2017: 5016 08/21/17: 1480.02 09/25/17: 350.07 10/23/17: 160.61 11/20/2017: 1533.99   Pathology report Thoracentesis, Diagnosis, 11/15/2017 PLEURAL FLUID, LEFT (SPECIMEN 1 OF 1 COLLECTED 11-15-2017) NO MALIGNANT CELLS IDENTIFIED.  FOUNDATION ONE TESTING 09/29/2016   PD-L1 Testing 09/29/2016   Diagnosis 09/27/2016 Consult- Comprehensive, [phone] - 1A Mass, Lower third of Esophagus, Mucosal Biopsy - ADENOCARCINOMA WITH EXTRACELLULAR MUCIN. - SEE COMMENT. Microscopic Comment Provided Her2 IHC with the appropriate controls is negative. Per report Her2 FISH is also negative. There is limited tissue remaining, but Foundation One and PDL-1 testing will be attempted as requested.  Diagnosis 09/19/2016 PLEURAL FLUID, LEFT (SPECIMEN 1 OF 1 COLLECTED 09/19/16): REACTIVE APPEARING MESOTHELIAL CELLS PRESENT.  Diagnosis 08/31/2016 Bone Marrow Biopsy, right iliac - METASTATIC ADENOCARCINOMA. - SEE COMMENT. PERIPHERAL BLOOD: - NORMOCYTIC ANEMIA. - THROMBOCYTOPENIA.  Diagnosis 08/02/16 PLEURAL FLUID, LEFT (SPECIMEN 1 OF 1 COLLECTED 08/02/16): REACTIVE MESOTHELIAL CELLS PRESENT. LYMPHOCYTES PRESENT.  Diagnosis 11/23/2015 Mass, lower third of the esophagus, mucosal biopsy Mucinous adenocarcinoma, moderately differentiated.  Diagnosis 04/23/2016 1. Lymph node, biopsy, Cervical - ONE OF ONE LYMPH NODES NEGATIVE FOR CARCINOMA (0/1). 2. Lymph node, biopsy, Periesophageal - BLOOD WITH SCANT BENIGN SOFT TISSUE AND SKELETAL MUSCLE. - NO MALIGNANCY IDENTIFIED. 3. Esophagogastrectomy - ULCERATION WITH INFLAMMATION AND REACTIVE CHANGES. - FOCAL INTESTINAL METAPLASIA. - CHANGES CONSISTENT WITH  TREATMENT EFFECT. - SEVEN OF SEVEN LYMPH NODES NEGATIVE FOR CARCINOMA (0/7). - INCIDENTAL LEIOMYOMA, 0.2 CM. - MARGINS ARE NEGATIVE FOR DYSPLASIA OR MALIGNANCY. - SEE ONCOLOGY TABLE. Microscopic Comment 3. ESOPHAGUS: Specimen: Esophagus and proximal stomach, cervical lymph node. Procedure: Esophagogastrectomy and cervical lymph node biopsy. Tumor Site: Presumed GE junction, see comment. Relationship of Tumor to esophagogastric junction: Can not be assessed. Distance of tumor center from esophagogastric junction: Can not be assessed. Tumor Size Greatest dimension: No residual tumor present. Histologic Type: Per medical record adenocarcinoma (no biopsy for review). Histologic Grade: N/A. Microscopic Tumor Extension: N/A. Margins: Negative. Treatment Effect: Present (TRS 0). Lymph-Vascular Invasion: Not identified. Perineural Invasion: Not identified. Lymph nodes: number examined 8; number positive: 0 TNM: ypT0, ypN0 see comment. 1 of 3 FINAL for RISHABH, RINKENBERGER ([id number]) Microscopic Comment(continued) Ancillary studies: None. Comments: The entire GE junction is submitted for evaluation. There is ulceration with associated inflammation. There is acellular mucin/myxoid changes which extend through the muscularis propria, but no viable/residual tumor is identified and thus the stage is ypT0. There is focal background intestinal metaplasia, which may have been pre-existing (Barrett's esophagus) or related to therapy.   RADIOGRAPHIC STUDIES: I have personally reviewed the radiological images as listed and agreed with the findings  in the report. Dg Chest 1 View  Result Date: 11/15/2017 CLINICAL DATA:  Status post thoracentesis for left-sided pleural effusion EXAM: CHEST  1 VIEW COMPARISON:  Chest CT 11/01/2017 FINDINGS: Power injectable right internal jugular vein approach right chest wall Port-A-Cath tip is in the lower SVC. There is a small left pleural effusion, decreased in size  compared to the prior CT. There is a tiny pneumothorax at the medial lung apex. IMPRESSION: Status post thoracentesis with decreased size of left pleural effusion and tiny left apical pneumothorax. Electronically Signed   By: Ulyses Jarred M.D.   On: 11/15/2017 14:10   Ir Thoracentesis Asp Pleural Space W/img Guide  Result Date: 11/15/2017 INDICATION: History of esophageal cancer. Shortness of breath. Left-sided pleural effusion. Request for diagnostic and therapeutic thoracentesis. EXAM: ULTRASOUND GUIDED LEFT THORACENTESIS MEDICATIONS: None. COMPLICATIONS: None immediate. Postprocedural chest x-ray suggest possible tiny apical pneumothorax. PROCEDURE: An ultrasound guided thoracentesis was thoroughly discussed with the patient and questions answered. The benefits, risks, alternatives and complications were also discussed. The patient understands and wishes to proceed with the procedure. Written consent was obtained. Ultrasound was performed to localize and mark an adequate pocket of fluid in the left chest. The area was then prepped and draped in the normal sterile fashion. 1% Lidocaine was used for local anesthesia. Under ultrasound guidance a 6 Fr Safe-T-Centesis catheter was introduced. Thoracentesis was performed. The catheter was removed and a dressing applied. FINDINGS: A total of approximately 900 mL of clear yellow fluid was removed. Samples were sent to the laboratory as requested by the clinical team. IMPRESSION: Successful ultrasound guided left thoracentesis yielding 900 mL of pleural fluid. Read by: Ascencion Dike PA-C Small pneumothorax questioned on post procedure radiograph. Pt remained asymptomatic, discharged home, knows to go to ED with any chest pain, shortness of breath, etc. Electronically Signed   By: Lucrezia Europe M.D.   On: 11/15/2017 15:51   PET 08/14/2016 IMPRESSION: 1. Small hypermetabolic left adrenal mass, this has not changed in size compared to 12/01/15 but has a maximum standard  uptake value of 6.2 (previously 4.2). Indeterminate density and MRI characteristics, not specific for adenoma. Early metastatic disease is not readily excluded although the lack of change of size is somewhat reassuring. Surveillance recommended. 2. No other hypermetabolic lesions are identified. 3. Moderate to large left pleural effusion with passive atelectasis. 4. Esophagectomy with gastric pull-through. 5. No hypermetabolic liver lesions are seen. 6. Enlarged prostate gland.  CT angio chest/abdomen/pelvis for dissection w/wo contrast 08/22/2016 IMPRESSION: No evidence of aortic aneurysm or dissection. No evidence of pulmonary embolus. Heart is borderline in size. Stable small left adrenal nodule which was hypermetabolic on prior PET CT. Stable large left pleural effusion with compressive atelectasis in the left lower lobe. Stable appearance of the gastric pull-through post esophagectomy.  US Thoracentesis asp pleural space 09/19/2016 IMPRESSION: Successful ultrasound guided diagnostic and therapeutic left thoracentesis yielding 1.5 liters of pleural fluid.  PET 11/12/2016 IMPRESSION: 1. Heterogeneous bony uptake with several mildly avid foci primarily in the lower thoracic spine, lumbar spine, and pelvic bones consistent with known bony metastatic disease. 2. There is new uptake in the right adrenal gland not seen previously with no CT correlate. Recommend attention on follow-up. 3. A rounded focus of uptake in the anterior peripheral left prostate is larger in the interval. The findings are nonspecific and could be infectious, inflammatory, or neoplastic. Focal prostatitis or prostate cancer are most likely. 4. The uptake in the left adrenal gland has decreased in  the interval. 5. Mild uptake in the bilateral hila may simply represent reactive nodes. Recommend attention on follow-up. 6. The left-sided pleural effusion is a little larger in the interval with loculated  components.    ASSESSMENT & PLAN:  77 y.o. male presented with dysphagia with solid food  1. 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 -The initial PET scan showed no definitive distant metastasis. 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 This will be followed in the future scan. -By EUS, he had T3 N1 stage III disease. -He completed neoadjuvant radiation with weekly Carbo and Taxol. -I previously reviewed his surgical pathology findings, he has had complete pathologic response to neoadjuvant chemotherapy and radiation, which predicts good prognosis -Unfortunately he developed diffuse bone metastasis in January 2018, confirmed by bone marrow biopsy -He has started first-line chemotherapy FOLFOX, tolerating well, and his anemia and thrombocytopenia has previously improved/resolved since chemotherapy -The goal of therapy is palliative -his tumor was negative for HER2 and PD-L1 expression and MSI-stable,  he is not a candidate for immunotherapy or anti-her2 therapy  -I previously reviewed his [facility] genomic testing results, unfortunately there is no good targeted therapy available. 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 I also discussed the option of maintenance Xeloda, he opted 5-fu  -We previously reviewed his 05/23/17 PET. Unfortunately his bone metastasis not measurable on the PET scan, and scan showed no other definitive evidence of  metastatic disease.  -He unfortunately developed disease progression on maintenance therapy. His CEA on 06/26/17 increased to 632. Due to his neuropathy, it would be difficult to restart Oxaliplatin.  -I changed his treatment to second line therapy with Taxol on day 1, 8 and 15 and ramucirumab on day 1 and 15, every 28 days starting 07/19/17 He is tolerating treatment moderately well progressive fatigue and weakness.  -He still has persistent significant fatigue. I previously dicussed the option of reducing his chemo to every other week, changing to irinotecan or the option of stopping chemotherapy all together if chemo is not tolerable.  -He opted to reduce Taxol to every 2 weeks starting 09/12/17.  -I encouraged him to remain active and walk as much as he can.  -He completed 3 cycles taxol/cyramza on 10/09/17. He has significant fatigue and weakness with increased dyspnea on standing. His limited performance status is negatively affecting his quality of life, but he wants to continue treatment in general. Lacie presribed short course of steroids to see if his fatigue improves, will take decadron 1 tab daily x5 days. -Due to his persisting SOB, we ordered a CT to rule out PE and echo.  We reviewed the scan results.  The CT scan was negative for PE or visible metastasis, he has stable bone metastasis.  Echo showed a normal EF with mild hypertrophy. He still has moderate left pleural effusion. I suspect this could be causing some of his SOB. I will send him to IR next week to see if they can repeat throacentesis.  -His treatment has been held since  10/23/2017 due to his very poor PS. However he continues to decline clinically. He is bed/wheelchair bounded now, with very poor appetite  -lab reviewed, he has developed severe anemia again, secondary to cancer progression and its related to DIC -Unfortunately he is not a candidate for further treatment, I recommend hospice and comfort care. I had lengthy discussion  with pt, his wife and daughter today, they agreed.   -I spoke with the patient and his family in detail today regarding the benefit of hospice and if the patient should opt for hospice, then the patient will follow up in the clinic PRN.  -will arrange blood transfusion today  -he has developed abdominal distention and leakage of fluids around his feeding tube, I will get a paracentesis in the next few days    2. 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 thrombocytopenia has resolved. -He has had 2 blood transfusions on 07/24/17 and 07/30/17 while on chemo. -He has developed a DIC again when he had cancer progression -Since he started chemotherapy, his DIC has improved -His Hgb improved to 11.4 previously, no blood tranfusion today  -His hgb is at 7.5 today, I will set the patient up for a blood transfusion, 12/03/2017   3. 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  -He has had 3 repeated thoracentesis, all cytology were negative, -Therapeutic thoracentesis performed on 09/19/16 yielded 1.5 liters of blood-tinged fluid. Reactive appearing mesothelial cells were present. -Chest Xray from 08/29/17 showed stable bilateral pleural effusion, L>R,  but not a significant amount to need a thoracentesis.  -Pt's effusion is also contributing to his SOB -Continue to follow-up with Dr. Servando Snare -thoracentesis completed on 11/15/2017 with pathology showing: no malignant cells identified.  4. HTN -History of hypertension, off meds due to borderline hypotension, except low-dose atenolol -Blood pressure normal lately.  Continue monitoring.  5. Malnutrition  -Doing better, he is tolerating J-tube feeding very well, he eats normally. Encouraged the patient to eat small frequent meals. -Feeding tube site is clean and no bleeding lately  -He gets tube feeds at night about 750cc/day   -Continue to follow up with nutritionist at the [facility] -Weight is stable lately with new J tube placement  -I encouraged the patient to continue with hydration as well as nutritional supplements.   6. Anorexia, depression and insomnia   -continue mirtazapine.  -He hasn't talked to the nutritionist in a while, he will follow up -Prescribed megace today   -I will prescribe phenergan today to aid with nausea.   7. Fatigue, Dyspnea on Exertion  -He would like something to help his energy. -We previously discussed steroids to help for this, but I told them that this is not a long term solution -He would not like steroids at this time.  -We previously discussed fatigue being related to chemo or anemia.  -I previously encouraged him to eat well with protein and be more active -Will reduce Taxol and continue to monitor closely.   -He is still very fatigued, barely able to walk for more than 5-10 minutes. This has not improved since we changed his chemo to every 2 weeks.  He does have dyspnea on mild exertion. -I ordered a CT Angio Chest to rule out PE on 11/01/17 that was negative. His echo was also LV EF: 60% - 65%  8. Goal of care discussion  -We previously discussed the incurable nature of his cancer, and the overall  poor prognosis, especially if he does not have good response to chemotherapy or progress on chemo -The patient understands the goal of care is palliative. -we again discussed code status, he agrees with hospice   9. 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  -His numbness had increased further up his hands.  -residual neuropathy still  present, 5FU stopped as well.  -Will continue to monitor with Taxol treatment    Plan -prescribe phenergan today for his nausea  -Blood transfusion to be given today  -IVF given to the patient today 500ML NS -Cancel his future appointment  -Hospice referral today  -paracentesis in the next few days    All questions were answered. The patient knows to call the clinic with any problems, questions or concerns.  I spent 30 minutes counseling the patient face to face. The total time spent in the appointment was 40 minutes and more than 50% was on counseling.  This document serves as a record of services personally performed by Truitt Merle, MD. It was created on her behalf by Steva Colder, a trained medical scribe. The creation of this record is based on the scribe's personal observations and the provider's statements to them.   I have reviewed the above documentation for accuracy and completeness, and I agree with the above.      Truitt Merle, MD 12/03/2017

## 2017-12-03 ENCOUNTER — Inpatient Hospital Stay: Payer: Non-veteran care

## 2017-12-03 ENCOUNTER — Other Ambulatory Visit: Payer: Self-pay

## 2017-12-03 ENCOUNTER — Telehealth: Payer: Self-pay | Admitting: Hematology

## 2017-12-03 ENCOUNTER — Inpatient Hospital Stay (HOSPITAL_BASED_OUTPATIENT_CLINIC_OR_DEPARTMENT_OTHER): Payer: Non-veteran care | Admitting: Hematology

## 2017-12-03 VITALS — BP 135/63 | HR 107 | Temp 98.2°F | Resp 22 | Ht 65.0 in

## 2017-12-03 DIAGNOSIS — D65 Disseminated intravascular coagulation [defibrination syndrome]: Secondary | ICD-10-CM | POA: Diagnosis not present

## 2017-12-03 DIAGNOSIS — E440 Moderate protein-calorie malnutrition: Secondary | ICD-10-CM

## 2017-12-03 DIAGNOSIS — R0609 Other forms of dyspnea: Secondary | ICD-10-CM | POA: Diagnosis not present

## 2017-12-03 DIAGNOSIS — C155 Malignant neoplasm of lower third of esophagus: Secondary | ICD-10-CM

## 2017-12-03 DIAGNOSIS — I10 Essential (primary) hypertension: Secondary | ICD-10-CM

## 2017-12-03 DIAGNOSIS — Z95828 Presence of other vascular implants and grafts: Secondary | ICD-10-CM

## 2017-12-03 DIAGNOSIS — E46 Unspecified protein-calorie malnutrition: Secondary | ICD-10-CM | POA: Diagnosis not present

## 2017-12-03 DIAGNOSIS — Z5112 Encounter for antineoplastic immunotherapy: Secondary | ICD-10-CM | POA: Diagnosis not present

## 2017-12-03 LAB — CBC WITH DIFFERENTIAL (CANCER CENTER ONLY)
BASOS ABS: 0.1 10*3/uL (ref 0.0–0.1)
BASOS PCT: 0 %
EOS ABS: 0.1 10*3/uL (ref 0.0–0.5)
Eosinophils Relative: 1 %
HCT: 21.7 % — ABNORMAL LOW (ref 38.4–49.9)
HEMOGLOBIN: 7.5 g/dL — AB (ref 13.0–17.1)
Lymphocytes Relative: 6 %
Lymphs Abs: 1.4 10*3/uL (ref 0.9–3.3)
MCH: 29.8 pg (ref 27.2–33.4)
MCHC: 34.6 g/dL (ref 32.0–36.0)
MCV: 86.1 fL (ref 79.3–98.0)
MONOS PCT: 10 %
Monocytes Absolute: 2.3 10*3/uL — ABNORMAL HIGH (ref 0.1–0.9)
NEUTROS ABS: 19.7 10*3/uL — AB (ref 1.5–6.5)
NEUTROS PCT: 83 %
NRBC: 1 /100{WBCs} — AB
PLATELETS: 130 10*3/uL — AB (ref 140–400)
RBC: 2.52 MIL/uL — AB (ref 4.20–5.82)
RDW: 23.4 % — ABNORMAL HIGH (ref 11.0–14.6)
WBC Count: 23.7 10*3/uL — ABNORMAL HIGH (ref 4.0–10.3)

## 2017-12-03 LAB — CMP (CANCER CENTER ONLY)
ALBUMIN: 2.9 g/dL — AB (ref 3.5–5.0)
ALK PHOS: 382 U/L — AB (ref 40–150)
ALT: 30 U/L (ref 0–55)
AST: 60 U/L — ABNORMAL HIGH (ref 5–34)
Anion gap: 8 (ref 3–11)
BILIRUBIN TOTAL: 1.7 mg/dL — AB (ref 0.2–1.2)
BUN: 19 mg/dL (ref 7–26)
CALCIUM: 8 mg/dL — AB (ref 8.4–10.4)
CO2: 22 mmol/L (ref 22–29)
Chloride: 94 mmol/L — ABNORMAL LOW (ref 98–109)
Creatinine: 0.59 mg/dL — ABNORMAL LOW (ref 0.70–1.30)
GFR, Estimated: >60 mL/min (ref >60)
GLUCOSE: 151 mg/dL — AB (ref 70–140)
POTASSIUM: 5.1 mmol/L (ref 3.5–5.1)
Sodium: 124 mmol/L — ABNORMAL LOW (ref 136–145)
TOTAL PROTEIN: 5.8 g/dL — AB (ref 6.4–8.3)

## 2017-12-03 LAB — ABO/RH: ABO/RH(D): O POS

## 2017-12-03 LAB — PREPARE RBC (CROSSMATCH)

## 2017-12-03 MED ORDER — SODIUM CHLORIDE 0.9 % IV SOLN: 250.0000 mL | Freq: Once | INTRAVENOUS | Status: DC

## 2017-12-03 MED ORDER — SODIUM CHLORIDE 0.9% FLUSH
10.0000 mL | INTRAVENOUS | Status: AC | PRN
  Administered 2017-12-03: 10 mL
  Filled 2017-12-03: qty 10

## 2017-12-03 MED ORDER — SODIUM CHLORIDE 0.9 % IV SOLN
Freq: Once | INTRAVENOUS | Status: AC
  Administered 2017-12-03: 13:00:00 via INTRAVENOUS

## 2017-12-03 MED ORDER — HEPARIN SOD (PORK) LOCK FLUSH 100 UNIT/ML IV SOLN
500.0000 [IU] | Freq: Every day | INTRAVENOUS | Status: AC | PRN
  Administered 2017-12-03: 500 [IU]
  Filled 2017-12-03: qty 5

## 2017-12-03 MED ORDER — PROMETHAZINE HCL 25 MG PO TABS: 25.0000 mg | ORAL_TABLET | Freq: Four times a day (QID) | ORAL | 0 refills | Status: AC | PRN

## 2017-12-03 MED ORDER — SODIUM CHLORIDE 0.9% FLUSH
10.0000 mL | Freq: Once | INTRAVENOUS | Status: AC
  Administered 2017-12-03: 10 mL
  Filled 2017-12-03: qty 10

## 2017-12-03 NOTE — Telephone Encounter (Signed)
Cancel his future appointment  Hospice referral abd [place] today or tomorrow      PER 5/14 LOS

## 2017-12-03 NOTE — Patient Instructions (Signed)
Blood Transfusion A blood transfusion is a procedure in which you are given blood through an IV tube. You may need this procedure because of:  Illness.  Surgery.  Injury.  The blood may come from someone else (a donor). You may also be able to donate blood for yourself (autologous blood donation). The blood given in a transfusion is made up of different types of cells. You may get:  Red blood cells. These carry oxygen to the cells in the body.  White blood cells. These help you fight infections.  Platelets. These help your blood to clot.  Plasma. This is the liquid part of your blood. It helps with fluid imbalances.  If you have a clotting disorder, you may also get other types of blood products. What happens before the procedure?  You will have a blood test to find out your blood type. The test also finds out what type of blood your body will accept and matches it to the donor type.  If you are going to have a planned surgery, you may be able to donate your own blood. This may be done in case you need a transfusion.  If you have had an allergic reaction to a transfusion in the past, you may be given medicine to help prevent a reaction. This medicine may be given to you by mouth or through an IV.  You will have your temperature, blood pressure, and pulse checked.  Follow instructions from your doctor about what you cannot eat or drink.  Ask your doctor about: ? Changing or stopping your regular medicines. This is important if you take diabetes medicines or blood thinners. ? Taking medicines such as aspirin and ibuprofen. These medicines can thin your blood. Do not take these medicines before your procedure if your doctor tells you not to. What happens during the procedure?  An IV tube will be put into one of your veins.  The bag of donated blood will be attached to your IV tube. Then, the blood will enter through your vein.  Your temperature, blood pressure, and pulse will be  checked regularly during the procedure. This is done to find early signs of a transfusion reaction.  If you have any signs or symptoms of a reaction, your transfusion will be stopped. You may also be given medicine.  When the transfusion is done, your IV tube will be taken out.  Pressure may be applied to the IV site for a few minutes.  A bandage (dressing) will be put on the IV site. The procedure may vary among doctors and hospitals. What happens after the procedure?  Your temperature, blood pressure, heart rate, breathing rate, and blood oxygen level will be checked often.  Your blood may be tested to see how you are responding to the transfusion.  You may be warmed with fluids or blankets. This is done to keep the temperature of your body normal. Summary  A blood transfusion is a procedure in which you are given blood through an IV tube.  The blood may come from someone else (a donor). You may also be able to donate blood for yourself.  If you have had an allergic reaction to a transfusion in the past, you may be given medicine to help prevent a reaction. This medicine may be given to you by mouth or through an IV tube.  Your temperature, blood pressure, heart rate, breathing rate, and blood oxygen level will be checked often.  Your blood may be tested to   see how you are responding to the transfusion. This information is not intended to replace advice given to you by your health care provider. Make sure you discuss any questions you have with your health care provider. Document Released: 10/05/2008 Document Revised: 03/02/2016 Document Reviewed: 03/02/2016 Elsevier Interactive Patient Education  2017 [employer]  

## 2017-12-04 ENCOUNTER — Other Ambulatory Visit: Payer: Non-veteran care

## 2017-12-04 ENCOUNTER — Ambulatory Visit: Payer: Non-veteran care | Admitting: Nurse Practitioner

## 2017-12-04 ENCOUNTER — Encounter: Payer: Self-pay | Admitting: Hematology

## 2017-12-04 ENCOUNTER — Ambulatory Visit: Payer: Non-veteran care

## 2017-12-04 LAB — TYPE AND SCREEN
ABO/RH(D): O POS
ANTIBODY SCREEN: NEGATIVE
Unit division: 0
Unit division: 0

## 2017-12-04 LAB — BPAM RBC
BLOOD PRODUCT EXPIRATION DATE: 201906042359
Blood Product Expiration Date: 201906042359
ISSUE DATE / TIME: 201905141343
ISSUE DATE / TIME: 201905141343
UNIT TYPE AND RH: 5100
Unit Type and Rh: 5100

## 2017-12-05 ENCOUNTER — Ambulatory Visit (HOSPITAL_COMMUNITY): Payer: Medicare Other | Attending: Hematology

## 2017-12-10 ENCOUNTER — Telehealth: Payer: Self-pay

## 2017-12-10 NOTE — Telephone Encounter (Signed)
Megan with Hospice called to let us know that the patient has decided to stop his tube feedings all together. Wants only sips of fluids.

## 2017-12-19 ENCOUNTER — Other Ambulatory Visit: Payer: Medicare Other

## 2017-12-19 ENCOUNTER — Ambulatory Visit: Payer: Medicare Other

## 2017-12-19 ENCOUNTER — Ambulatory Visit: Payer: Medicare Other | Admitting: Nurse Practitioner

## 2017-12-21 DEATH — deceased

## 2017-12-26 ENCOUNTER — Encounter: Payer: Non-veteran care | Admitting: Cardiothoracic Surgery

## 2018-01-12 IMAGING — US US ABDOMEN COMPLETE
1 series · 14 of 25 positions shown · non-contrast
Comparison: CT 08/14/2016

CLINICAL DATA: Abnormal liver function.

EXAM:
ABDOMEN ULTRASOUND COMPLETE

[Series 1: us abdomen complete · 0.26mm/px · 14 of 135 slices shown]
[im 1/135]
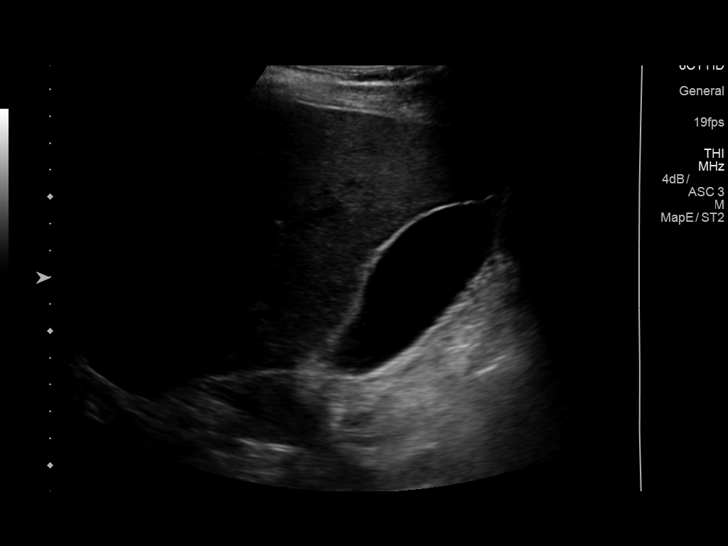
[im 12/135]
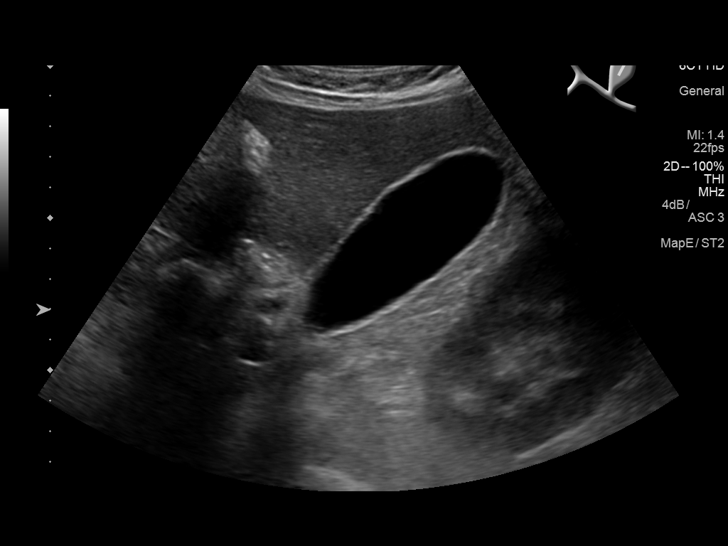
[im 23/135]
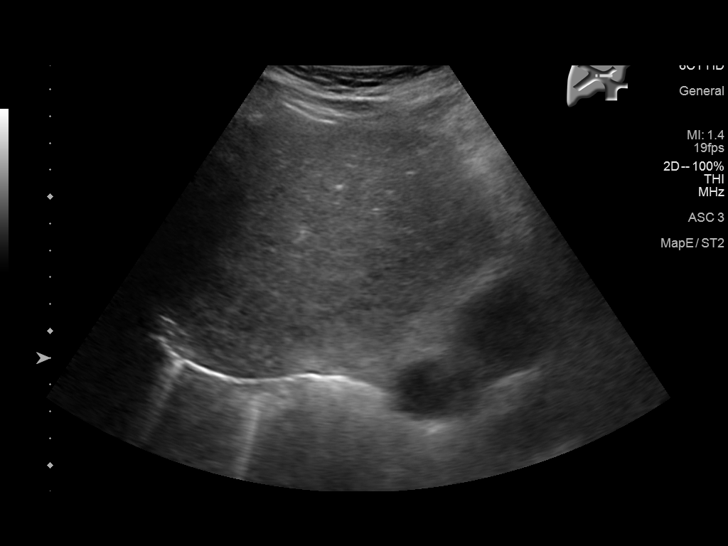
[im 34/135]
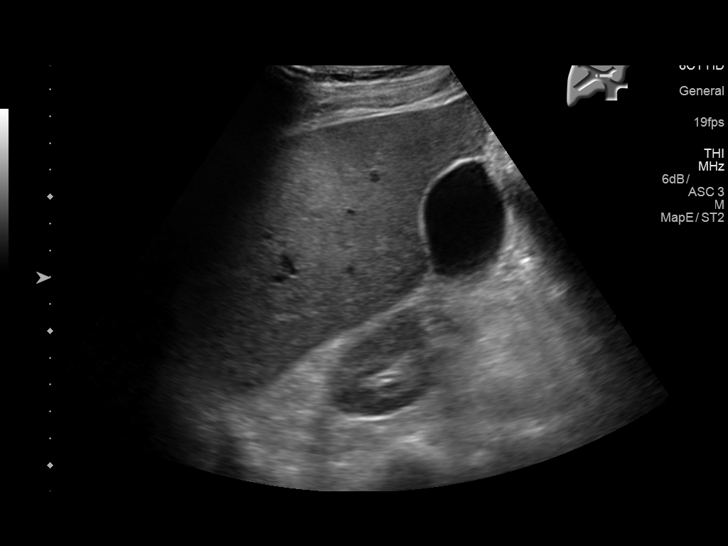
[im 45/135]
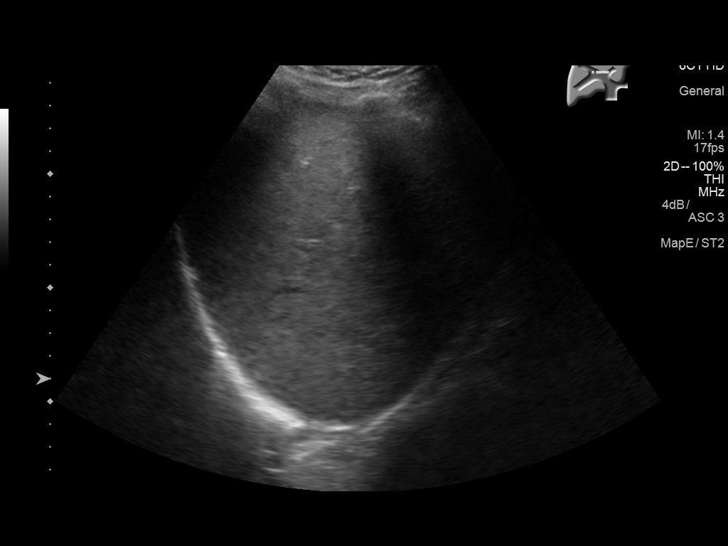
[im 51/135]
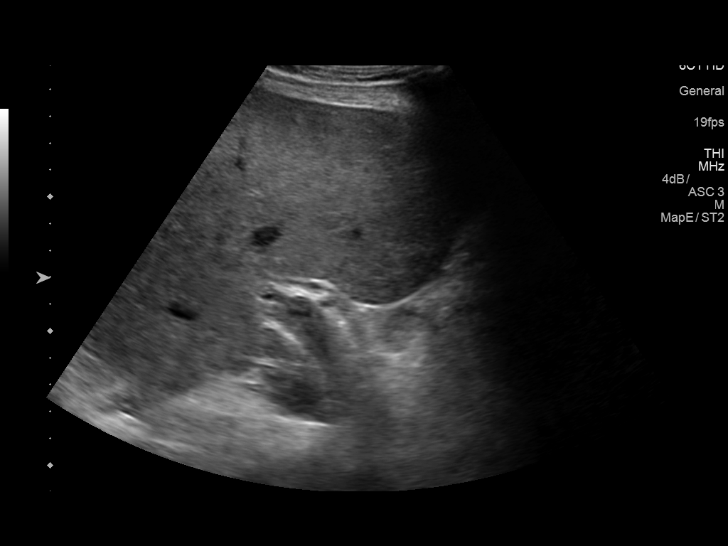
[im 62/135]
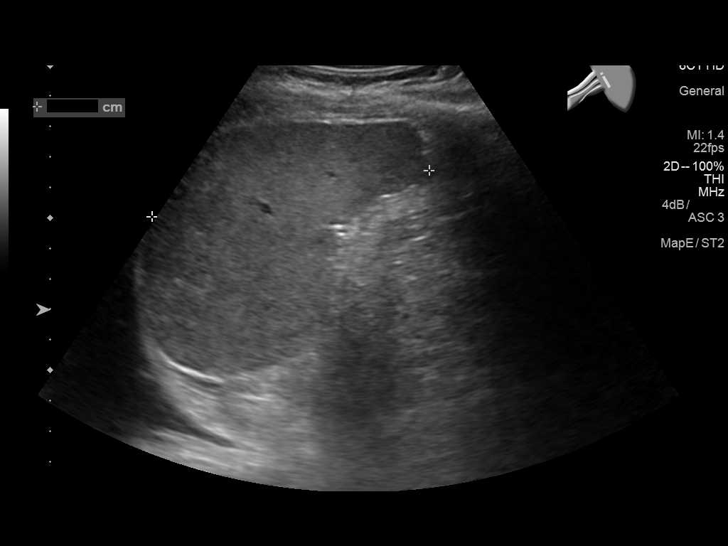
[im 73/135]
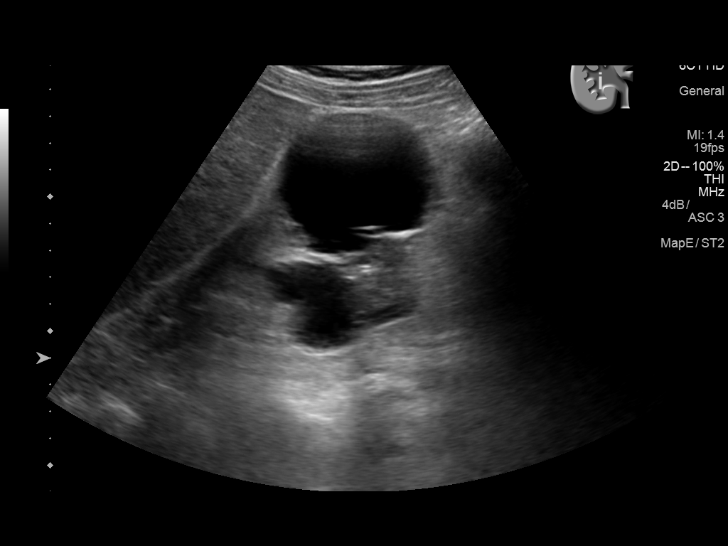
[im 84/135]
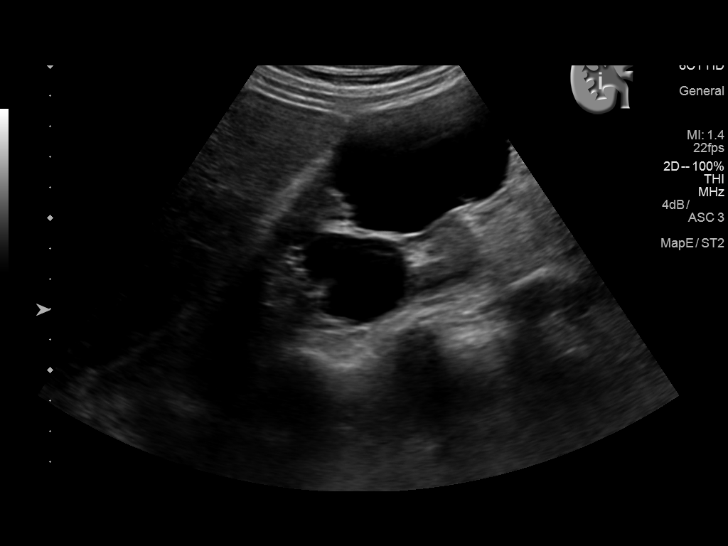
[im 90/135]
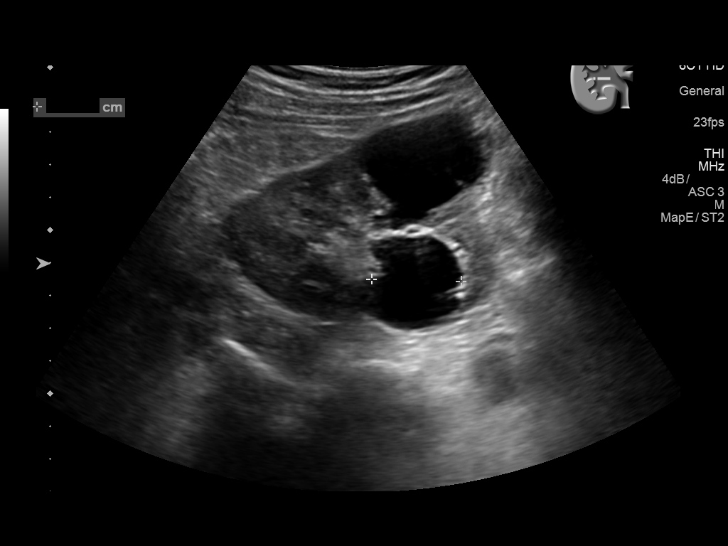
[im 101/135]
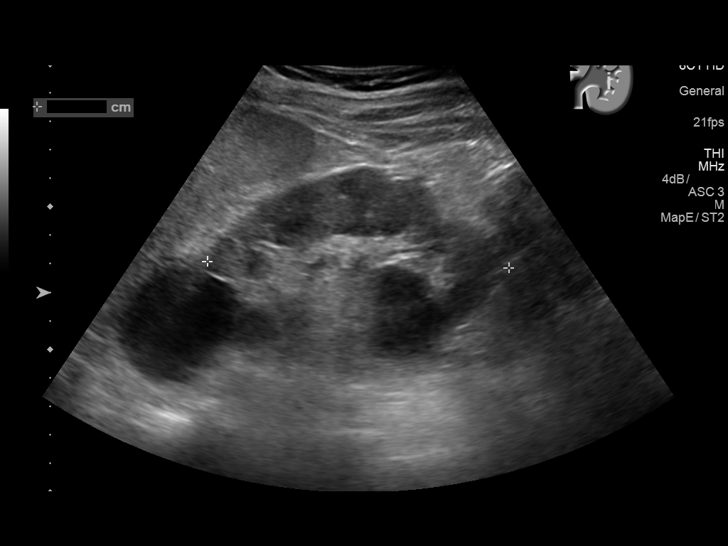
[im 112/135]
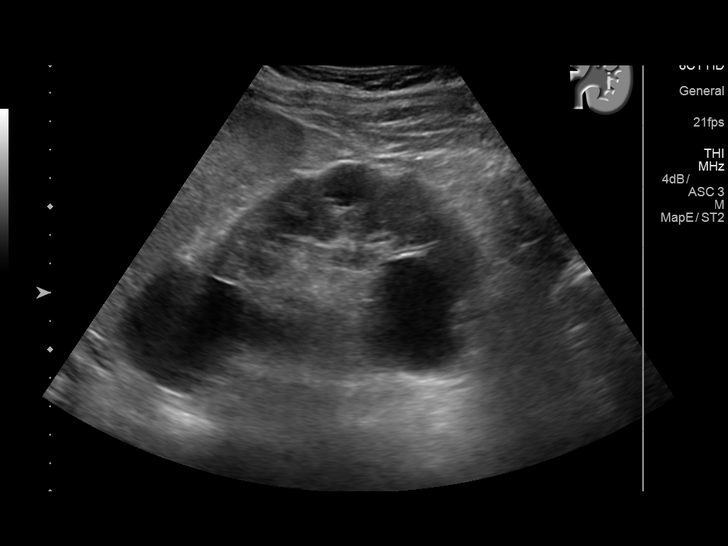
[im 123/135]
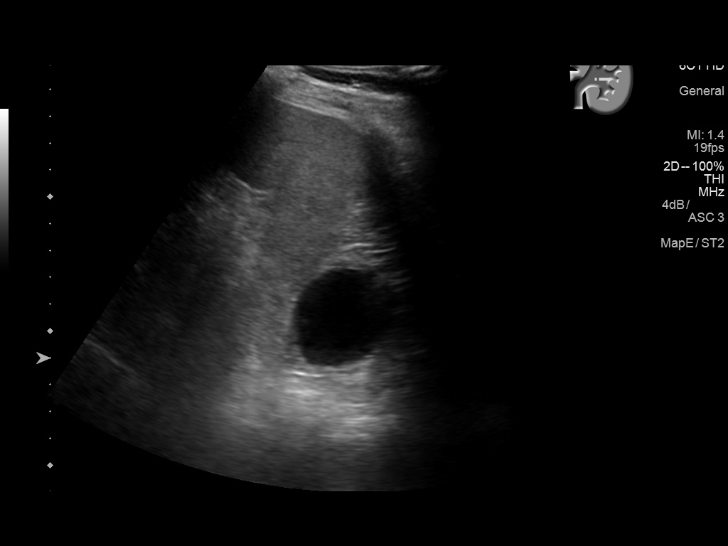
[im 135/135]
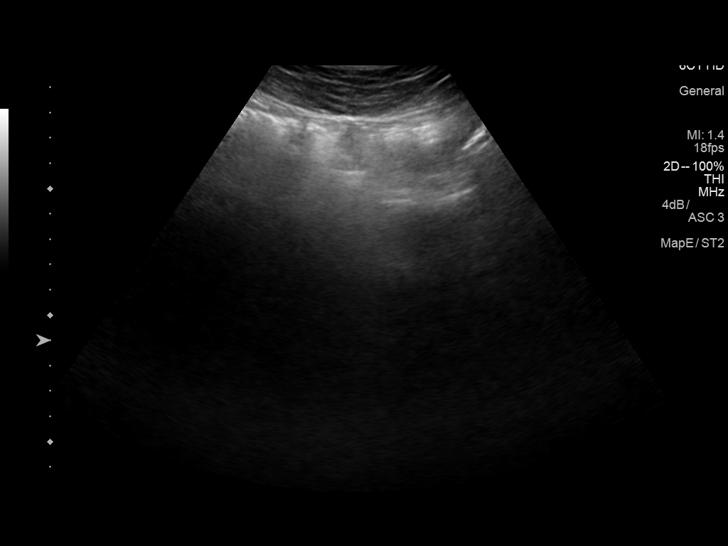

[14 of 25 positions shown; findings below may reference images not displayed]

FINDINGS: Gallbladder: No gallstones or wall thickening visualized. No
sonographic Murphy sign noted by sonographer.

Common bile duct: Diameter: Normal caliber, 5 mm

Liver: No focal lesion identified. Within normal limits in
parenchymal echogenicity.

IVC: No abnormality visualized.

Pancreas: Visualized portion unremarkable.

Spleen: Size and appearance within normal limits.

Right Kidney: Length: 11.6 cm. Multiple cysts, the largest 6.1 cm.
No hydronephrosis. Normal echotexture.

Left Kidney: Length: 10.6 cm. Multiple cysts, the largest 4.7 cm. No
hydronephrosis. Normal echotexture.

Abdominal aorta: No aneurysm visualized.

Other findings: None.
IMPRESSION: No acute findings.

Bilateral renal cysts.

## 2018-07-13 IMAGING — CR DG CHEST 2V
2 series · 2 of 2 positions shown · non-contrast
Comparison: July 04, 2016

CLINICAL DATA: Esophageal carcinoma

EXAM:
CHEST  2 VIEW

[w chest pa]
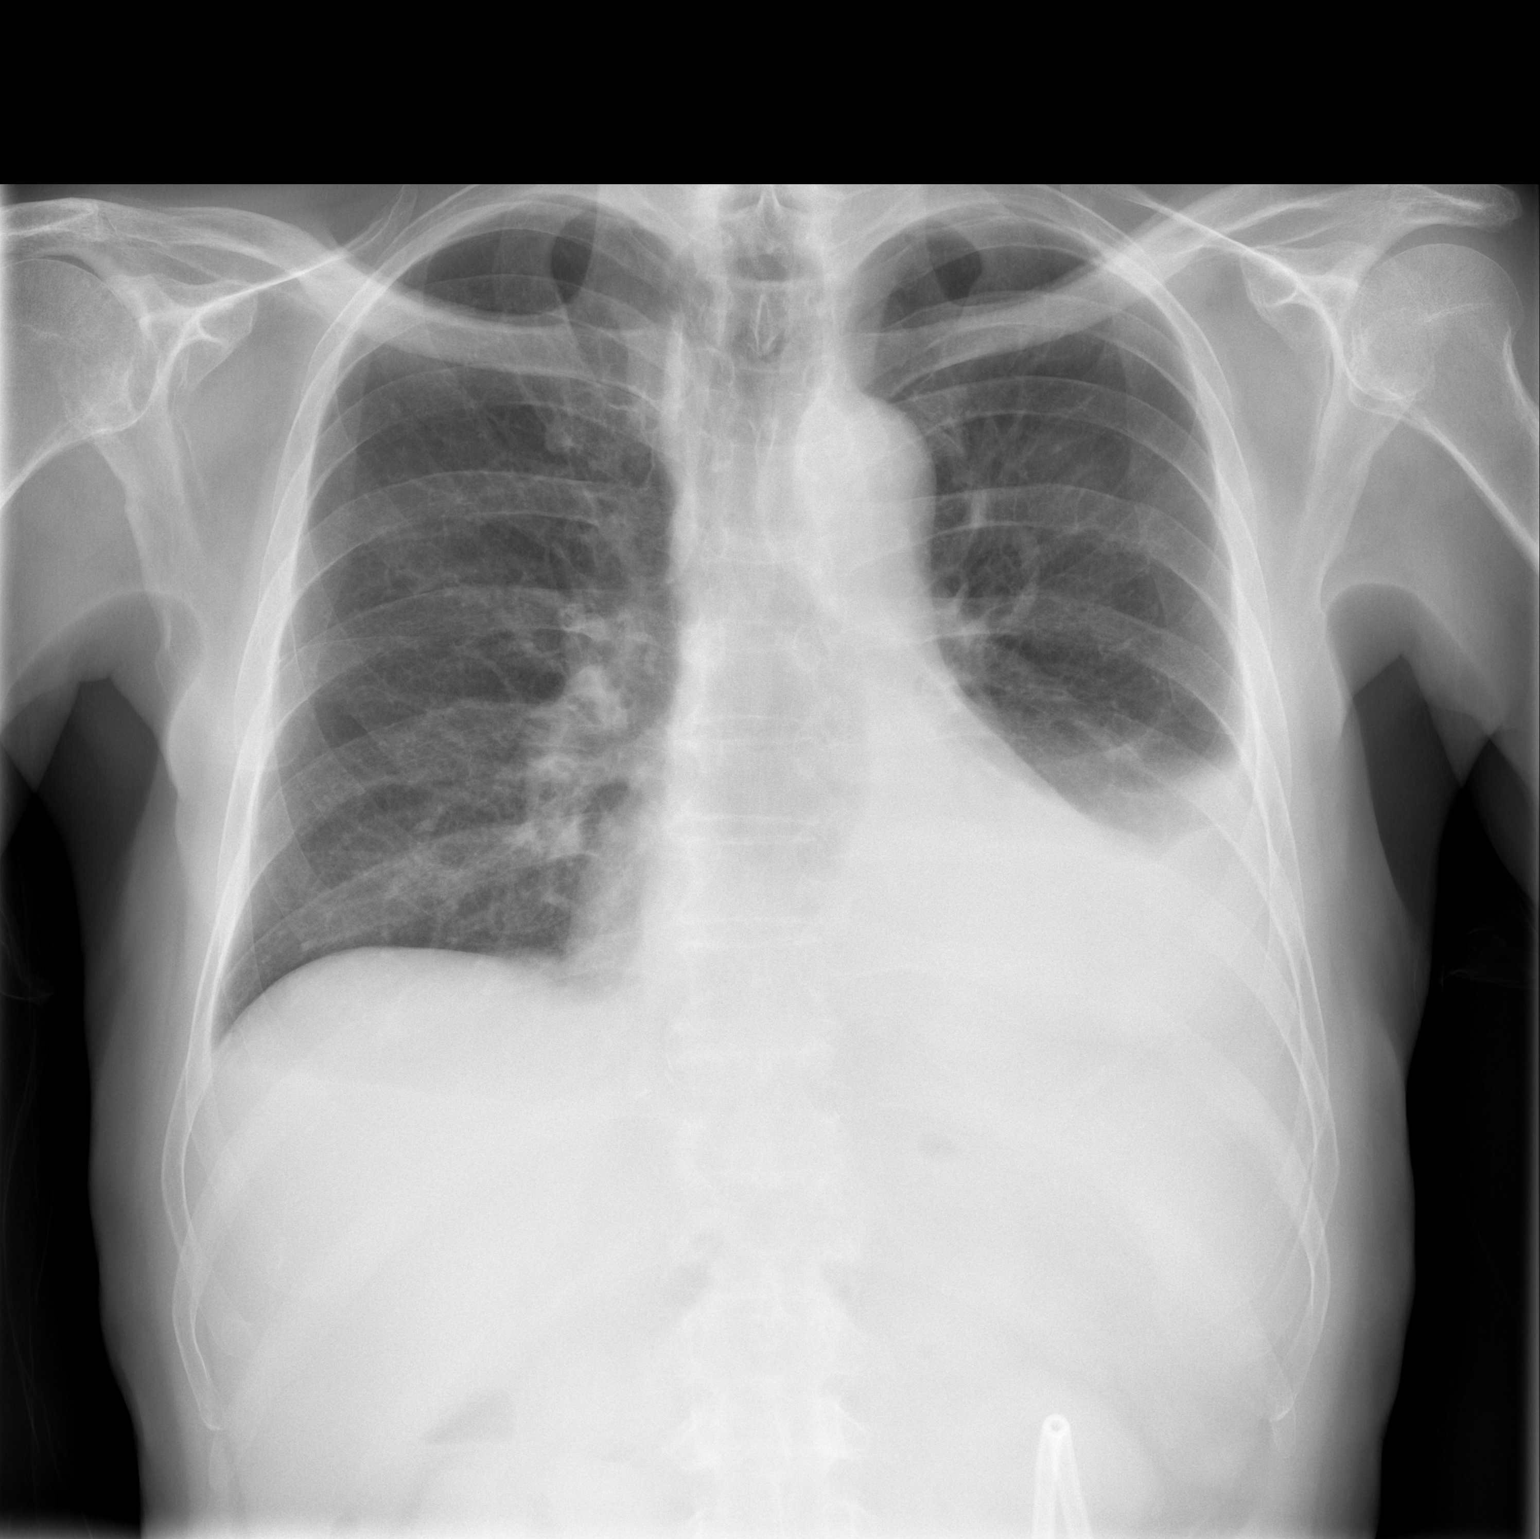

[w chest lat]
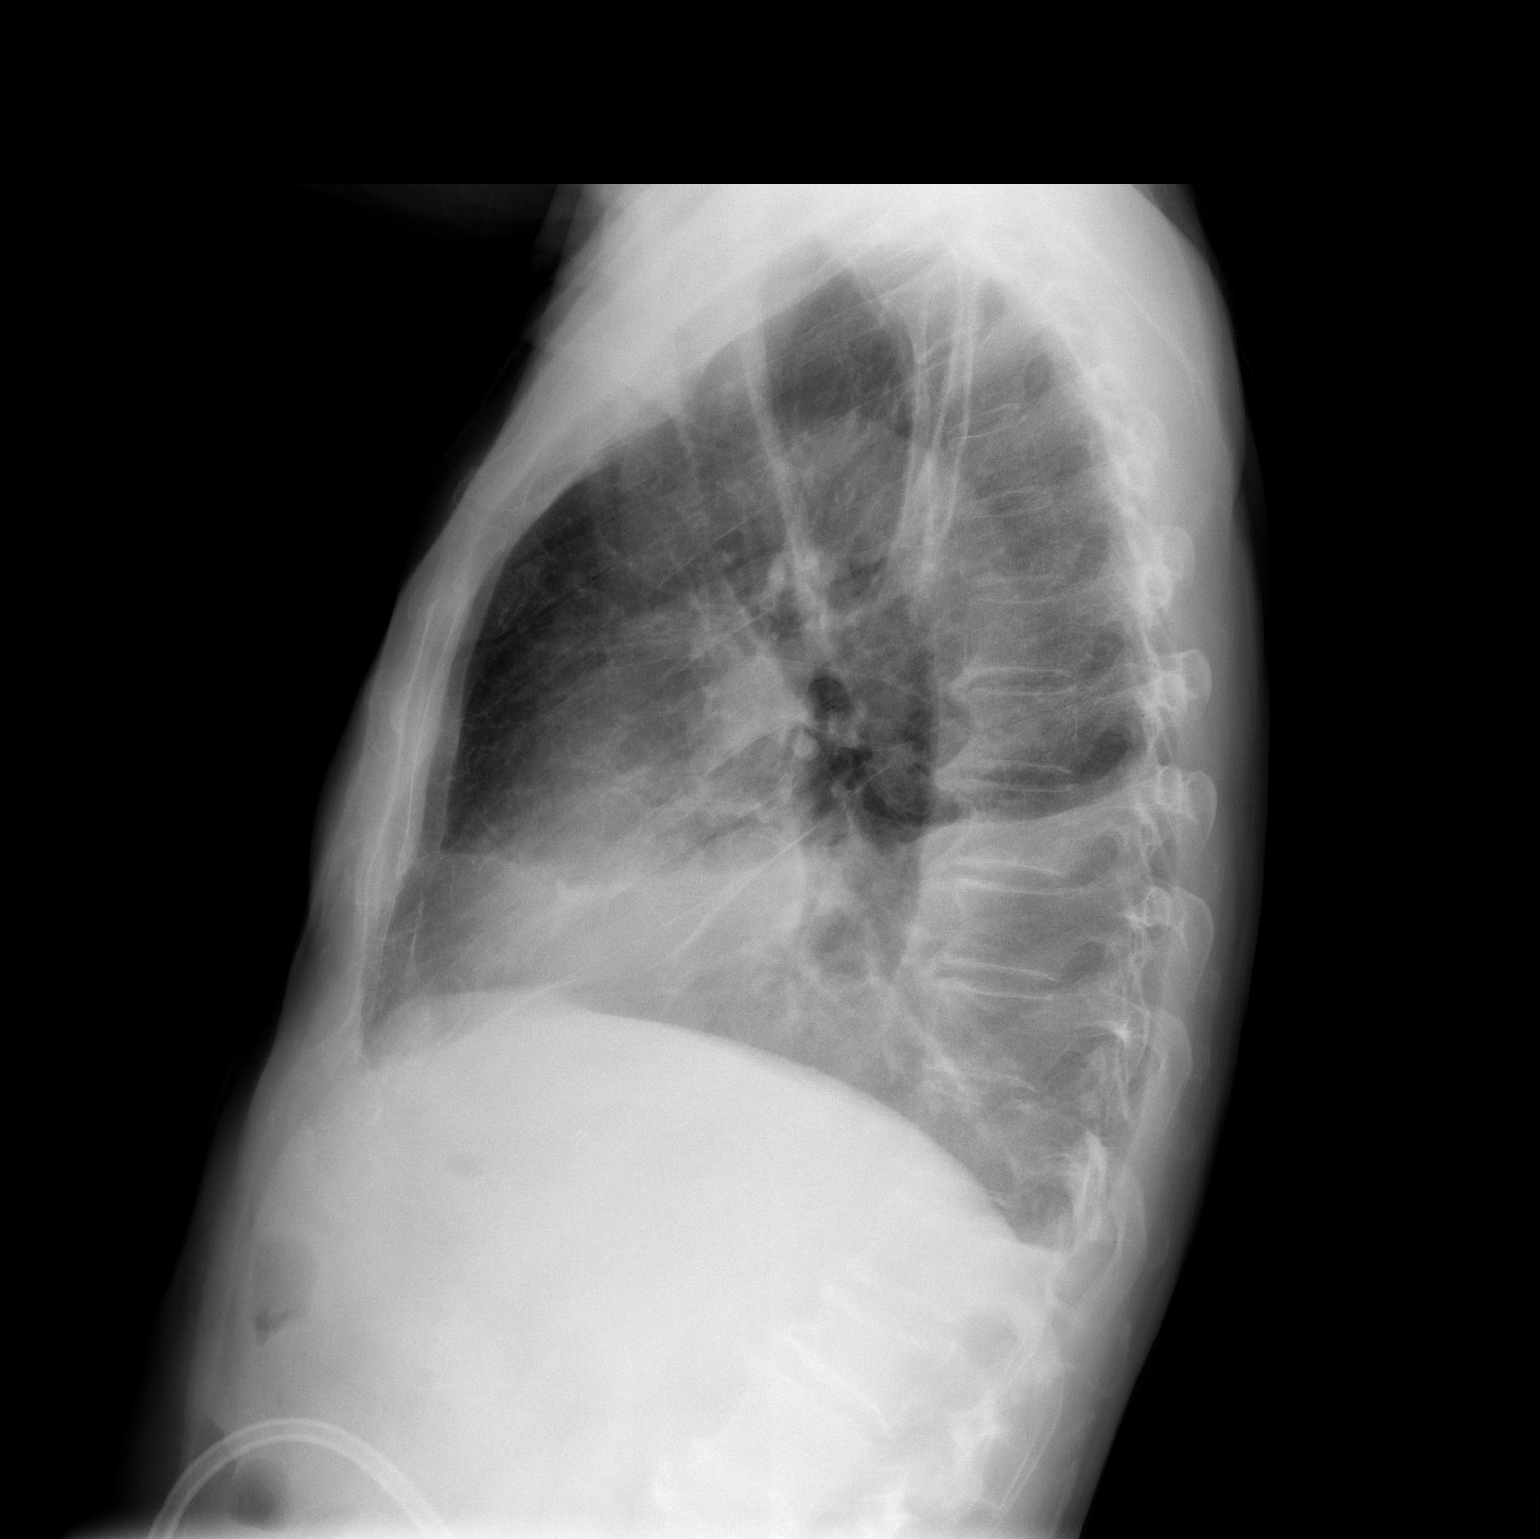

[2 of 2 positions shown; findings below may reference images not displayed]

FINDINGS: There is a moderate pleural effusion on the left with consolidation
left lower lobe and inferior lingula. Right lung is clear. Heart
size and pulmonary vascularity are normal. No adenopathy. Air is
noted throughout much of the esophagus. There is cortical
irregularity in portions of the posterior left sixth, seventh, and
eighth ribs, concerning for potential metastatic foci in these
areas.
IMPRESSION: Moderate left pleural effusion with left base and inferior lingular
consolidation. Right lung clear. Stable cardiac silhouette. Note
that there is extensive air throughout much of the esophagus.
Concern for bony metastases in the left posterior sixth, seventh,
and eighth ribs.

## 2018-08-03 IMAGING — DX DG CHEST 1V PORT
1 series · 1 of 1 positions shown · non-contrast
Comparison: 08/02/2016 chest radiograph and 08/14/2016 PET-CT.

CLINICAL DATA: 75 y/o  M; fever and history of esophageal cancer.

EXAM:
PORTABLE CHEST 1 VIEW

[chest ap]
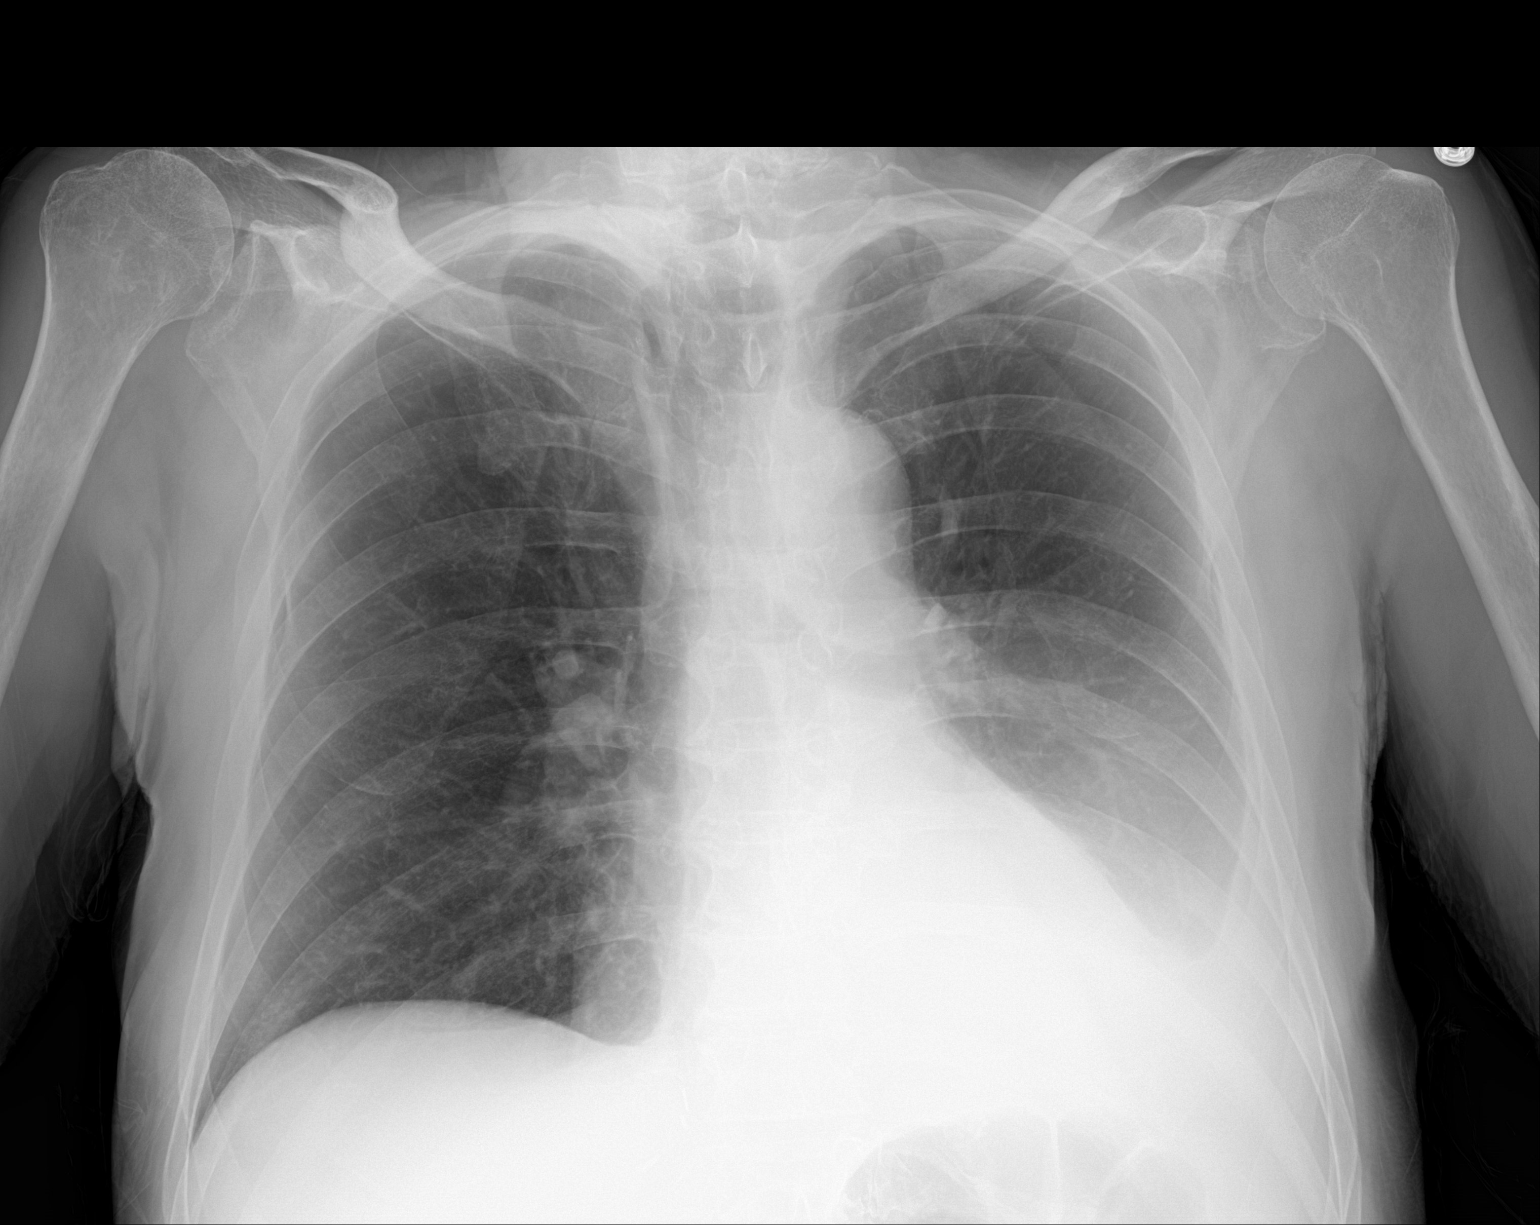

[1 of 1 positions shown; findings below may reference images not displayed]

FINDINGS: The heart size and mediastinal contours are within normal limits and
stable. Moderate left pleural effusion is increased from prior
radiograph with probably stable from prior PET-CT with
redistribution to the lung base. Associated left basilar opacity.
Otherwise no focal consolidation identified. The visualized skeletal
structures are unremarkable.
IMPRESSION: Moderate left pleural effusion increased from prior chest
radiograph, probably stable from prior chest PET-CT. She fusion of
the lung base. Associated left basilar opacity probably represents
atelectasis, underline pneumonia is not excluded.

By: Favio Junior Hc M.D.

## 2019-01-04 IMAGING — DX DG CHEST 2V
2 series · 2 of 2 positions shown · non-contrast
Comparison: 12/13/2016 and previous

CLINICAL DATA: Esophageal cancer treated with radiation and
chemotherapy. Evaluate pleural effusion.

EXAM:
CHEST  2 VIEW

[dg chest 2 view (1 of 2)]
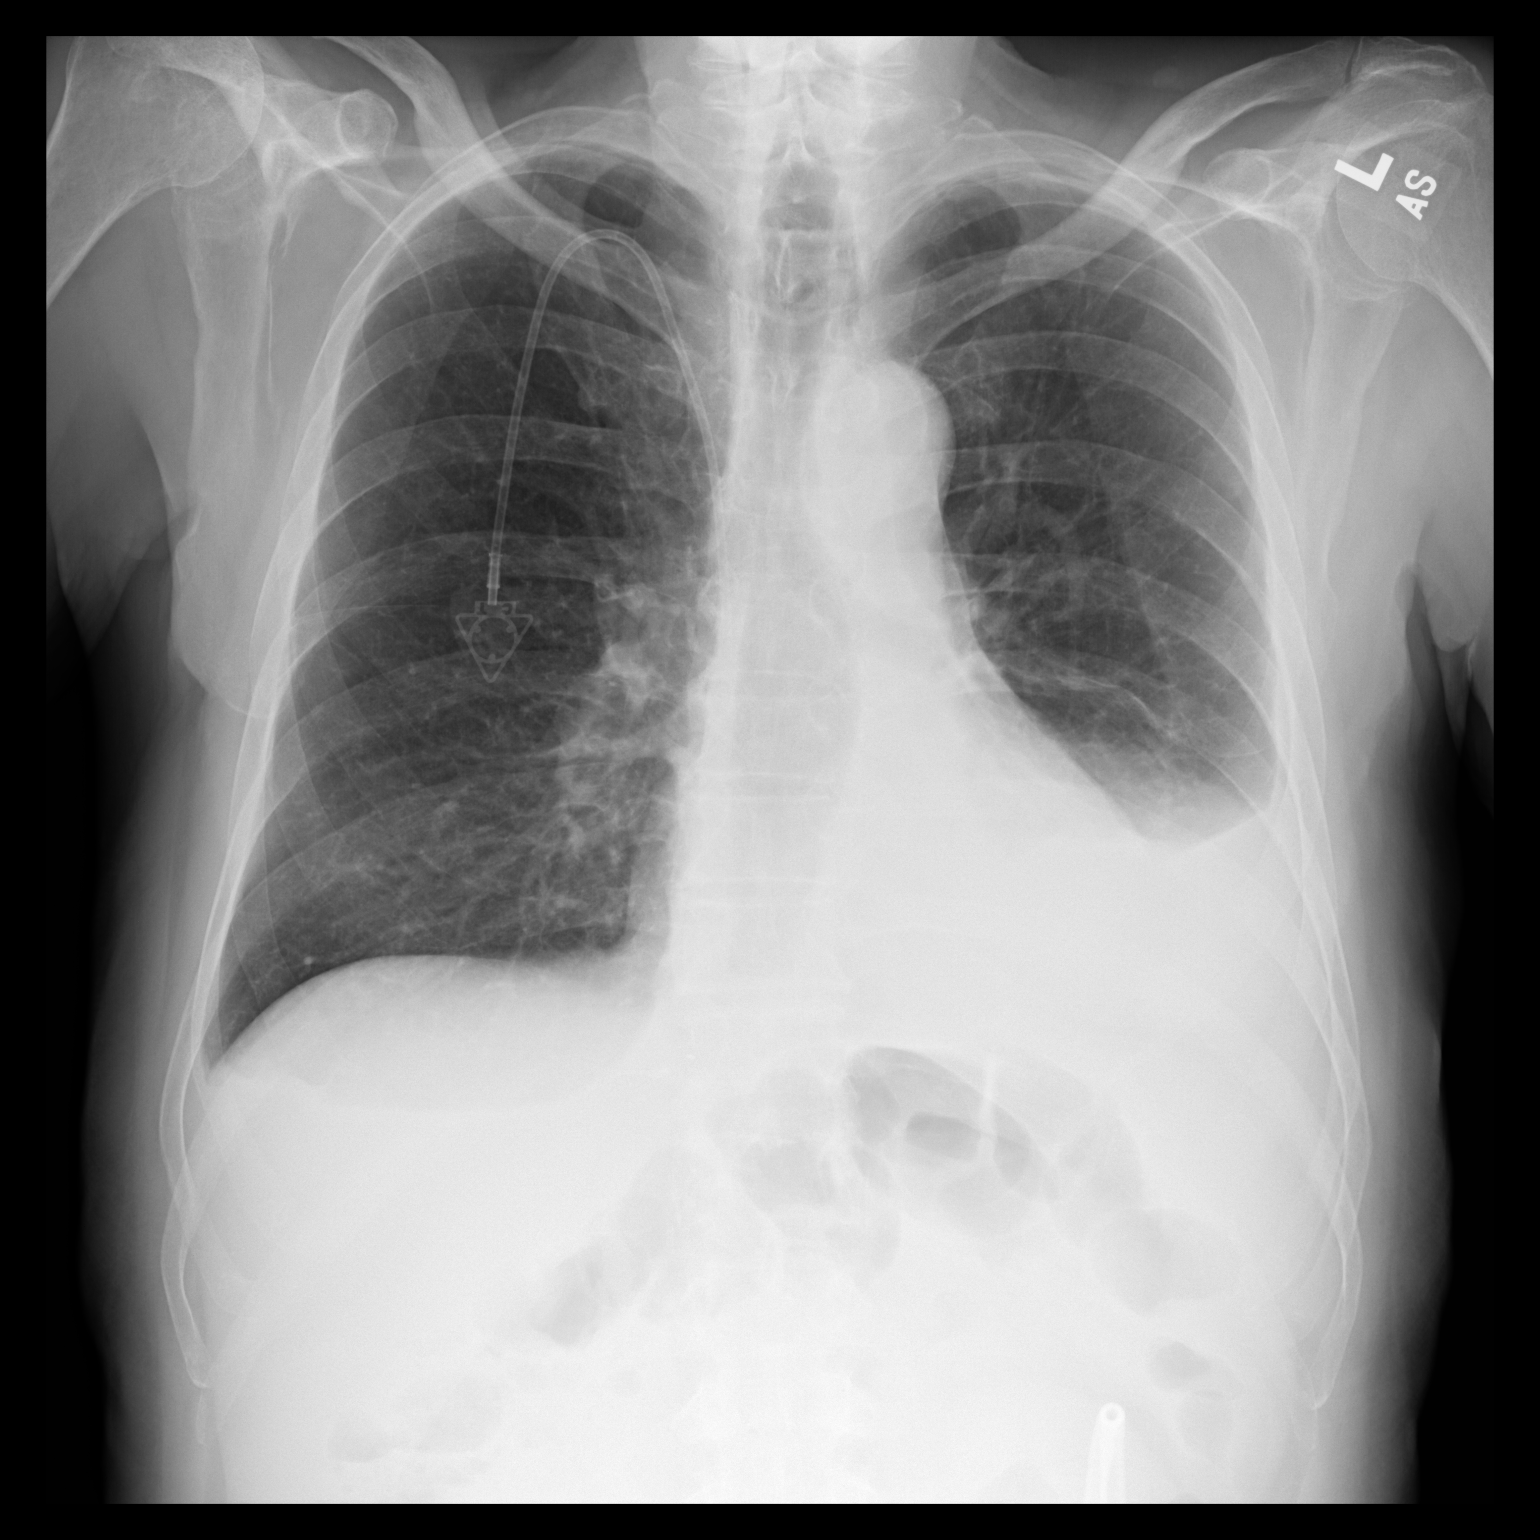

[dg chest 2 view (2 of 2)]
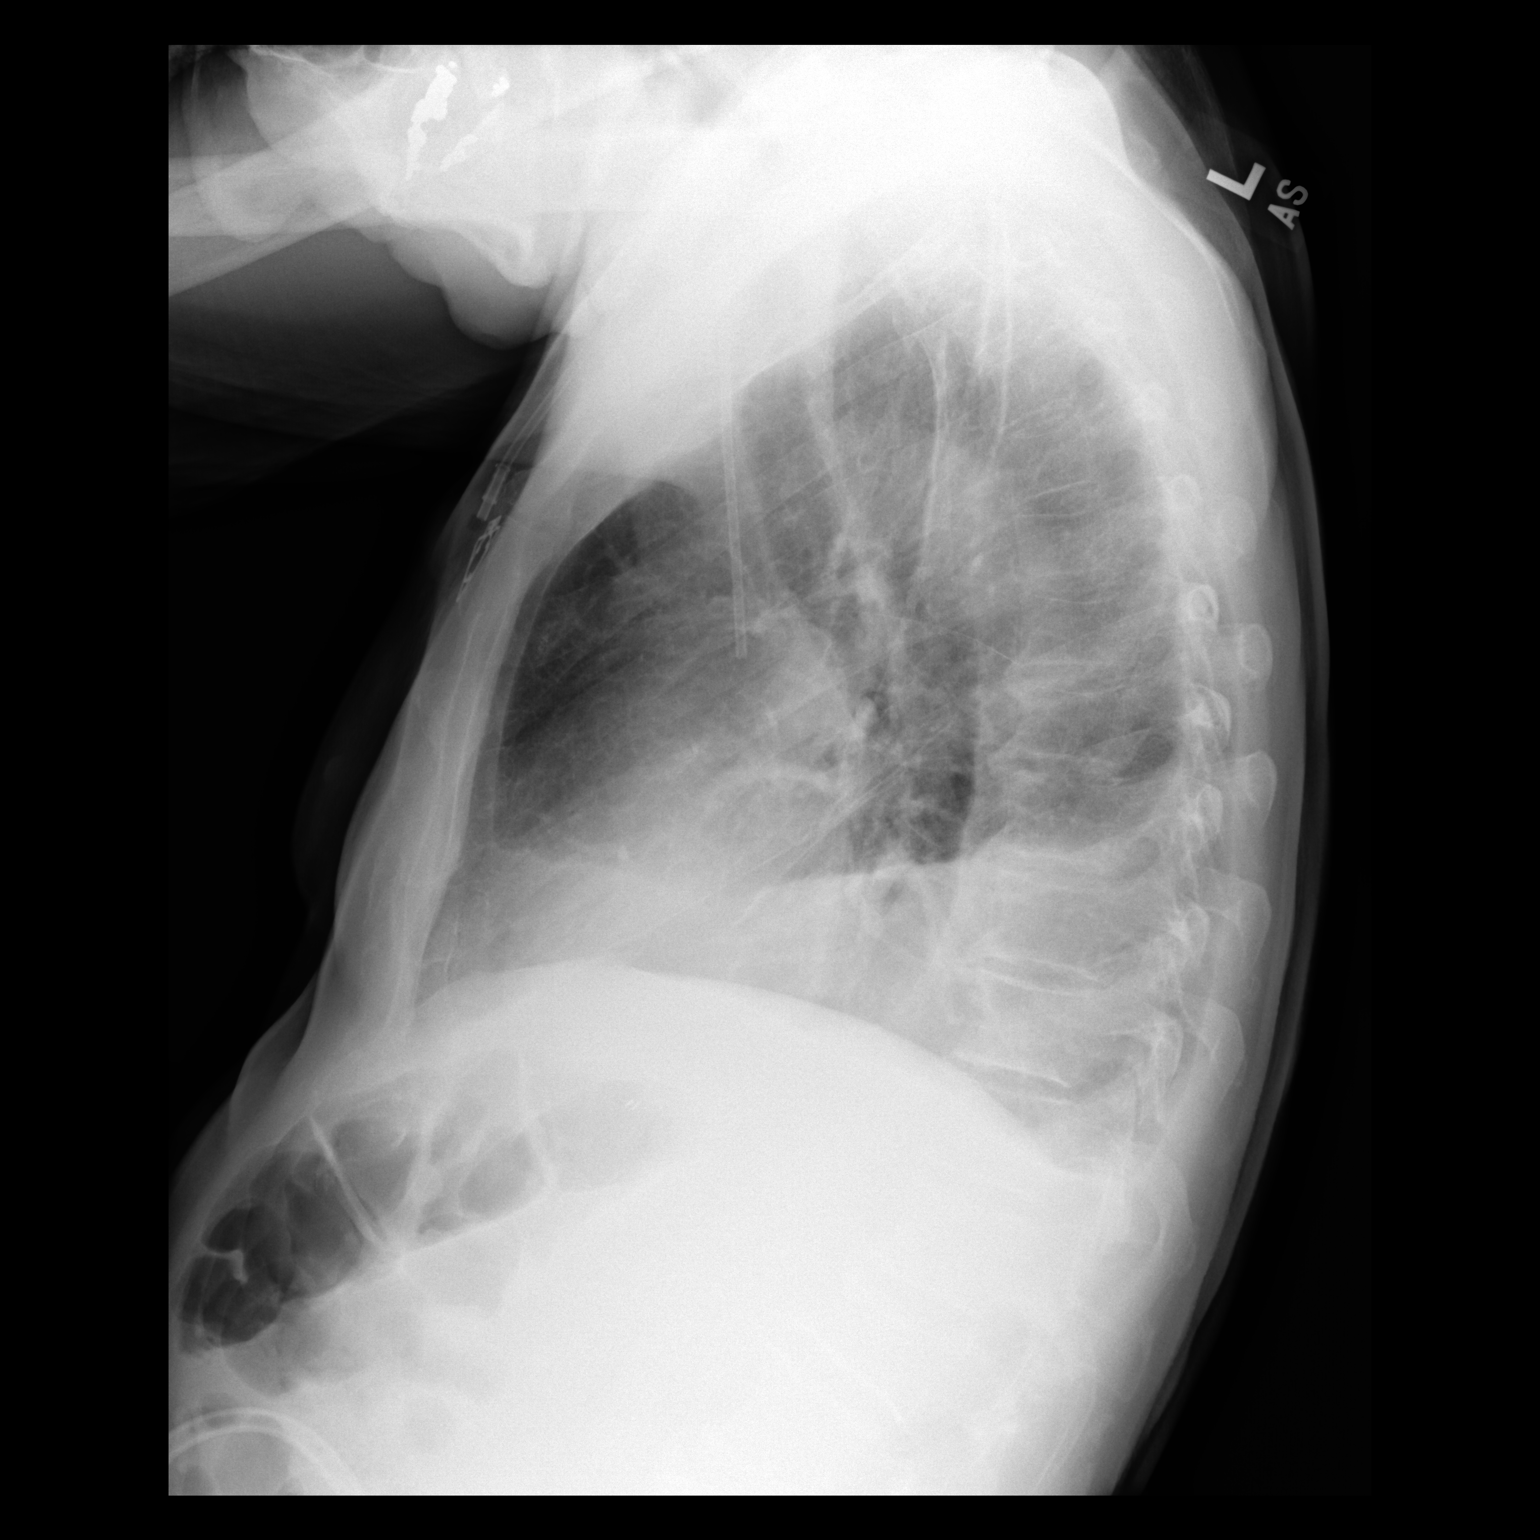

[2 of 2 positions shown; findings below may reference images not displayed]

FINDINGS: Power port inserted on the right has its tip in the SVC above the
right atrium. Right chest again shows minimal blunting of the
posterior costophrenic angle but is otherwise clear. On the left,
there is a moderate effusion layering dependently, similar to the
previous exam, probably slightly reduced. Dependent pulmonary
atelectasis associated with that. No acute bone finding. Partial
compression fracture of L1 as seen previously.
IMPRESSION: Persistent left effusion, probably slightly smaller than on the
previous exam.

Tiny amount of fluid in the posterior costophrenic angle on the
right.
